# Patient Record
Sex: Female | Born: 1960
Health system: Southern US, Community
[De-identification: ages and names within clinical notes are randomized; demographics above are authoritative.]

## PROBLEM LIST (undated history)

## (undated) DIAGNOSIS — G709 Myoneural disorder, unspecified: Secondary | ICD-10-CM

## (undated) DIAGNOSIS — E119 Type 2 diabetes mellitus without complications: Secondary | ICD-10-CM

## (undated) DIAGNOSIS — G4730 Sleep apnea, unspecified: Secondary | ICD-10-CM

## (undated) DIAGNOSIS — E669 Obesity, unspecified: Secondary | ICD-10-CM

## (undated) DIAGNOSIS — Z794 Long term (current) use of insulin: Secondary | ICD-10-CM

## (undated) DIAGNOSIS — Z8719 Personal history of other diseases of the digestive system: Secondary | ICD-10-CM

## (undated) DIAGNOSIS — K219 Gastro-esophageal reflux disease without esophagitis: Secondary | ICD-10-CM

## (undated) DIAGNOSIS — Z87442 Personal history of urinary calculi: Secondary | ICD-10-CM

## (undated) DIAGNOSIS — I10 Essential (primary) hypertension: Secondary | ICD-10-CM

## (undated) DIAGNOSIS — F32A Depression, unspecified: Secondary | ICD-10-CM

## (undated) DIAGNOSIS — F419 Anxiety disorder, unspecified: Secondary | ICD-10-CM

## (undated) DIAGNOSIS — Z8489 Family history of other specified conditions: Secondary | ICD-10-CM

## (undated) DIAGNOSIS — D219 Benign neoplasm of connective and other soft tissue, unspecified: Secondary | ICD-10-CM

## (undated) DIAGNOSIS — M1990 Unspecified osteoarthritis, unspecified site: Secondary | ICD-10-CM

## (undated) DIAGNOSIS — E785 Hyperlipidemia, unspecified: Secondary | ICD-10-CM

## (undated) DIAGNOSIS — Z98811 Dental restoration status: Secondary | ICD-10-CM

## (undated) DIAGNOSIS — G43909 Migraine, unspecified, not intractable, without status migrainosus: Secondary | ICD-10-CM

## (undated) DIAGNOSIS — A35 Other tetanus: Secondary | ICD-10-CM

## (undated) DIAGNOSIS — Z9889 Other specified postprocedural states: Secondary | ICD-10-CM

## (undated) DIAGNOSIS — R112 Nausea with vomiting, unspecified: Secondary | ICD-10-CM

## (undated) DIAGNOSIS — R002 Palpitations: Secondary | ICD-10-CM

## (undated) DIAGNOSIS — IMO0001 Reserved for inherently not codable concepts without codable children: Secondary | ICD-10-CM

## (undated) DIAGNOSIS — IMO0002 Reserved for concepts with insufficient information to code with codable children: Secondary | ICD-10-CM

## (undated) DIAGNOSIS — J449 Chronic obstructive pulmonary disease, unspecified: Secondary | ICD-10-CM

## (undated) DIAGNOSIS — T7840XA Allergy, unspecified, initial encounter: Secondary | ICD-10-CM

## (undated) DIAGNOSIS — R0602 Shortness of breath: Secondary | ICD-10-CM

## (undated) DIAGNOSIS — G629 Polyneuropathy, unspecified: Secondary | ICD-10-CM

## (undated) DIAGNOSIS — F329 Major depressive disorder, single episode, unspecified: Secondary | ICD-10-CM

## (undated) DIAGNOSIS — Z8742 Personal history of other diseases of the female genital tract: Secondary | ICD-10-CM

## (undated) DIAGNOSIS — D649 Anemia, unspecified: Secondary | ICD-10-CM

## (undated) HISTORY — PX: LAPAROSCOPY: SHX197

## (undated) HISTORY — DX: Myoneural disorder, unspecified: G70.9

## (undated) HISTORY — DX: Obesity, unspecified: E66.9

## (undated) HISTORY — PX: SMALL INTESTINE SURGERY: SHX150

## (undated) HISTORY — DX: Gastro-esophageal reflux disease without esophagitis: K21.9

## (undated) HISTORY — DX: Reserved for concepts with insufficient information to code with codable children: IMO0002

## (undated) HISTORY — DX: Unspecified osteoarthritis, unspecified site: M19.90

## (undated) HISTORY — DX: Allergy, unspecified, initial encounter: T78.40XA

## (undated) HISTORY — DX: Essential (primary) hypertension: I10

## (undated) HISTORY — PX: ESOPHAGOGASTRODUODENOSCOPY: SHX1529

## (undated) HISTORY — DX: Hyperlipidemia, unspecified: E78.5

## (undated) HISTORY — PX: SPINE SURGERY: SHX786

## (undated) HISTORY — PX: BACK SURGERY: SHX140

## (undated) HISTORY — PX: JOINT REPLACEMENT: SHX530

## (undated) HISTORY — DX: Chronic obstructive pulmonary disease, unspecified: J44.9

---

## 1991-01-24 HISTORY — PX: TUBAL LIGATION: SHX77

## 1999-10-13 ENCOUNTER — Other Ambulatory Visit: Admission: RE | Admit: 1999-10-13 | Discharge: 1999-10-13 | Payer: Self-pay | Admitting: Obstetrics & Gynecology

## 1999-11-04 ENCOUNTER — Ambulatory Visit (HOSPITAL_COMMUNITY): Admission: RE | Admit: 1999-11-04 | Discharge: 1999-11-04 | Payer: Self-pay | Admitting: Obstetrics & Gynecology

## 1999-11-04 HISTORY — PX: PELVIC LAPAROSCOPY: SHX162

## 2003-12-04 ENCOUNTER — Ambulatory Visit (HOSPITAL_BASED_OUTPATIENT_CLINIC_OR_DEPARTMENT_OTHER): Admission: RE | Admit: 2003-12-04 | Discharge: 2003-12-04 | Payer: Self-pay | Admitting: Family Medicine

## 2004-09-16 ENCOUNTER — Encounter: Admission: RE | Admit: 2004-09-16 | Discharge: 2004-09-16 | Payer: Self-pay | Admitting: Obstetrics & Gynecology

## 2004-10-03 ENCOUNTER — Other Ambulatory Visit: Admission: RE | Admit: 2004-10-03 | Discharge: 2004-10-03 | Payer: Self-pay | Admitting: Obstetrics & Gynecology

## 2009-05-11 ENCOUNTER — Encounter: Payer: Self-pay | Admitting: Internal Medicine

## 2009-05-11 ENCOUNTER — Ambulatory Visit: Payer: Self-pay | Admitting: Gastroenterology

## 2009-05-11 DIAGNOSIS — R197 Diarrhea, unspecified: Secondary | ICD-10-CM | POA: Insufficient documentation

## 2009-05-11 DIAGNOSIS — K921 Melena: Secondary | ICD-10-CM | POA: Insufficient documentation

## 2009-05-11 DIAGNOSIS — D509 Iron deficiency anemia, unspecified: Secondary | ICD-10-CM | POA: Insufficient documentation

## 2009-05-11 DIAGNOSIS — K219 Gastro-esophageal reflux disease without esophagitis: Secondary | ICD-10-CM | POA: Insufficient documentation

## 2009-05-11 DIAGNOSIS — R1310 Dysphagia, unspecified: Secondary | ICD-10-CM | POA: Insufficient documentation

## 2009-05-19 ENCOUNTER — Encounter: Payer: Self-pay | Admitting: Internal Medicine

## 2009-05-23 HISTORY — PX: COLONOSCOPY: SHX174

## 2009-06-04 ENCOUNTER — Ambulatory Visit: Payer: Self-pay | Admitting: Internal Medicine

## 2009-06-04 ENCOUNTER — Ambulatory Visit (HOSPITAL_COMMUNITY): Admission: RE | Admit: 2009-06-04 | Discharge: 2009-06-04 | Payer: Self-pay | Admitting: Internal Medicine

## 2009-06-04 HISTORY — PX: ESOPHAGOGASTRODUODENOSCOPY: SHX1529

## 2009-06-08 ENCOUNTER — Encounter: Payer: Self-pay | Admitting: Internal Medicine

## 2009-06-16 ENCOUNTER — Encounter: Payer: Self-pay | Admitting: Internal Medicine

## 2010-02-13 ENCOUNTER — Encounter: Payer: Self-pay | Admitting: Obstetrics & Gynecology

## 2010-02-24 NOTE — Letter (Signed)
Summary: External Other  External Other   Imported By: Peggyann Shoals 05/19/2009 13:56:12  _____________________________________________________________________  External Attachment:    Type:   Image     Comment:   External Document

## 2010-02-24 NOTE — Letter (Signed)
Summary: tcs/egd order  tcs/egd order   Imported By: Ave Filter 05/11/2009 10:18:57  _____________________________________________________________________  External Attachment:    Type:   Image     Comment:   External Document

## 2010-02-24 NOTE — Assessment & Plan Note (Signed)
Summary: ABNORMAL LABS,GU   Visit Type:  consult Referring Provider:  Lilyan Punt Primary Care Provider:  Lilyan Punt  Chief Complaint:  abnormal labs and anemia.  History of Present Illness: [name] is a pleasant 50 y/o WF, patient of Dr. Gerda Diss, who presents for further evaluation of microcytic anemia. She denies h/o chronic anemia. In 3/11, she was found to have Hgb 9.3, MCV 71.5. She was started on iron supplements. She has h/o heavy menses over past several months. She has h/o endometriosis and uterine fibroids. She has noted blood clots per rectum a couple of months ago as well as more recently has had fresh blood with wiping. She has had increased stool frequency from 2-3 stools per day to now at least 5/day. Stools are all loose. Denies melena. She c/o lower abd cramps but not sure if due to gyn source. She c/o abd bloating and gas.  She has had heartburn for most of her life. Dx with hiatal hernia at age 66 via UGI series. Has been on PPI chronically, but since lost insurance she takes OTC omeprazole one to two times daily. She c/o dysphagia to liquids, like she has spasm when swallowing and then liquids comes back up. She denies unintentional weight loss. Lost 30 pounds last yr but gained 13 back. She admits to taking up to 15 ibuprofen daily for abd pain, but stopped in 3/11.  Labs, April 09, 2009. Hemoglobin 9.3, hematocrit 33.8, MCV 71.5, WBC 10,000, platelets 287,000, glucose 152, creatinine 0.76, LFTs normal, TSH 0.986.  Current Medications (verified): 1)  Iron Otc 27 Mg .... Three Times A Day 2)  Janumet 50-1000 Mg Tabs (Sitagliptin-Metformin Hcl) .... Two Times A Day 3)  Diovan 160 Mg Tabs (Valsartan) .... Once Daily 4)  Celexa 40 Mg Tabs (Citalopram Hydrobromide) .... Once Daily 5)  Prilosec 20 Mg Cpdr (Omeprazole) .... Once Daily 6)  Albuterol Sulfate (2.5 Mg/77ml) 0.083% Nebu (Albuterol Sulfate) .... Prn  Allergies (verified): 1)  ! Sulfa 2)  ! Codeine 3)  !  Prozac 4)  ! * Blood Pressure Meds 5)  ! Lisinopril 6)  ! Dyazide  Past History:  Past Medical History: Diabetes GERD Hypertension Kidney Stones, age 63s Obesity Uterine fibroids/endometriosis Asthma Carpel Tunnel, bilateral Right knee OA  Past Surgical History: Tubal Ligation Laproscopy due to endometriosis, twice Wisdom teeth extraction  Family History: No FH of CRC, liver, IBD, chronic GI illnesses.  Social History: Married. One child. Never smoked. No alcohol. No drugs. Unemployed for two yrs d/t health.  Review of Systems General:  Complains of fatigue and weakness; denies fever, chills, sweats, anorexia, and weight loss. Eyes:  Denies vision loss. ENT:  Complains of difficulty swallowing; denies nasal congestion, sore throat, and hoarseness. CV:  Denies chest pains, angina, palpitations, dyspnea on exertion, and peripheral edema;  . Resp:  Denies dyspnea at rest, dyspnea with exercise, cough, sputum, and wheezing; rarely uses inhaler for asthma. GI:  See HPI. GU:  Denies urinary burning and blood in urine. MS:  Complains of joint pain / LOM. Derm:  Denies rash and itching. Neuro:  Denies weakness, frequent headaches, memory loss, and confusion. Psych:  Denies depression and anxiety. Endo:  Denies unusual weight change. Heme:  Denies bruising and bleeding. Allergy:  Denies hives and rash.  Vital Signs:  Patient profile:   50 year old female Height:      63 inches Weight:      221 pounds BMI:     39.29 Temp:  98.2 degrees F oral Pulse rate:   80 / minute BP sitting:   140 / 90  (left arm) Cuff size:   regular  Vitals Entered By: Hendricks Limes LPN (May 11, 2009 9:33 AM)  Physical Exam  General:  Well developed, well nourished, no acute distress.obese.   Head:  Normocephalic and atraumatic. Eyes:  Conjunctivae pink, no scleral icterus.  Mouth:  Oropharyngeal mucosa moist, pink.  No lesions, erythema or exudate.    Neck:  Supple; no masses or  thyromegaly. Lungs:  Clear throughout to auscultation. Heart:  Regular rate and rhythm; no murmurs, rubs,  or bruits. Abdomen:  Bowel sounds normal.  Abdomen is soft, nontender, nondistended.  No rebound or guarding.  No hepatosplenomegaly, masses or hernias.  No abdominal bruits.  Rectal:  deferred until time of colonoscopy.   Extremities:  No clubbing, cyanosis, edema or deformities noted. Neurologic:  Alert and  oriented x4;  grossly normal neurologically. Skin:  Intact without significant lesions or rashes. Cervical Nodes:  No significant cervical adenopathy. Psych:  Alert and cooperative. Normal mood and affect.  Impression & Recommendations:  Problem # 1:  ANEMIA-IRON DEFICIENCY (ICD-280.9)  IDA in setting of heavy menses and hematachezia. Patient reports that she may have hysterectomy in near future. Given h/o passing blood clot per rectum and ongoing hematachezia, change in stool with increased frequent loose stools she needs TCS. We need to exclude IBD, NSAIDS induced colitis, malignancy. Colonoscopy to be performed in near future.  Risks, alternatives, and benefits including but not limited to the risk of reaction to medication, bleeding, infection, and perforation were addressed.  Patient voiced understanding and provided verbal consent. Will hold iron seven days before procedure.  Orders: Consultation Level IV (16109)  Problem # 2:  GERD (ICD-530.81)  Chronic GERD, difficulty to control on OTC prilosec. She c/o dysphagia to liquids. ?cervical esophageal stricture. Given significant ibuprofen use and IDA would also advise r/o PUD. EGD/ED to be performed in near future.  Risks, alternatives, benefits including but not limited to risk of reaction to medications, bleeding, infection, and perforation addressed.  Patient voiced understanding and verbal consent obtained.   Orders: Consultation Level IV (60454) I would like to thank Dr. Lilyan Punt for allowing [place] to take part in the  care of this nice patient.

## 2010-02-24 NOTE — Letter (Signed)
Summary: Patient Notice, Endo Biopsy Results  [facility]  [address]   [place], [place] [address]   Phone: [phone]  Fax: [phone]       Jun 08, 2009   Carrie Cook [address] [place], [place]  [address] 1961/01/19    Dear Ms. Lansberry,  I am pleased to inform you that the biopsies taken during your recent endoscopic examination did not show any evidence of cancer upon pathologic examination.  Additional information/recommendations:  Please call [phone] to schedule a return visit to review your condition.  Continue with the treatment plan as outlined on the day of your exam.  You should have a repeat endoscopic examination in 10 years.   Please call us if you are having persistent problems or have questions about your condition that have not been fully answered at this time.  Sincerely,    R. Roetta Sessions MD, FACP [facility] Ph: [phone]   Fax: [phone]   Appended Document: Patient Notice, Endo Biopsy Results Letter mailed to pt.  Appended Document: Patient Notice, Endo Biopsy Results Reminder placed in IDX.

## 2010-04-12 LAB — RETICULIN ANTIBODIES, IGA W TITER: Reticulin Ab, IgA: NEGATIVE

## 2010-04-12 LAB — TISSUE TRANSGLUTAMINASE, IGA: Tissue Transglutaminase Ab, IgA: 1.7 U/mL (ref <20)

## 2010-04-12 LAB — GLIADIN ANTIBODIES, SERUM
Gliadin IgA: 1.9 U/mL (ref <20)
Gliadin IgG: 15 U/mL (ref <20)

## 2010-04-12 LAB — GLUCOSE, CAPILLARY: Glucose-Capillary: 155 mg/dL — ABNORMAL HIGH (ref 70–99)

## 2010-04-12 LAB — IGA: IgA: 68 mg/dL (ref 68–378)

## 2010-06-10 NOTE — Op Note (Signed)
[facility]  Patient:    Carrie Cook, Carrie Cook                     MRN: [id number] Proc. Date: 11/04/99 Adm. Date:  [id number] Attending:  Minette Headland                           Operative Report  PREOPERATIVE DIAGNOSIS:  Chronic right pelvic pain.  Previous known history of pelvic endometriosis.  POSTOPERATIVE DIAGNOSIS:  Boggy enlargement of uterus consistent with adenomyosis, minimal evidence of recurrent pelvic peritoneal endometriosis, but significant occurrence of what appeared to be an endometrioma in the proximal segment of [place] divided right fallopian tube.  OPERATIVE PROCEDURE:  Laparoscopy, fulguration of two pelvic peritoneal lesions of endometriosis and fulguration of the proximal portion of each remaining fallopian tube with spontaneous drainage of chocolate-like material from the right consistent with a small endometrioma.  SURGEON:  Freddy Finner, M.D.  ANESTHESIA:  General endotracheal.  INTRAOPERATIVE COMPLICATIONS: None.  INTRAOPERATIVE BLOOD LOSS:  10 cc or less.  DETAILS OF PRESENT ILLNESS:  Include recurrence of symptoms that the patient relates to pelvic endometriosis.  She has previously been diagnosed with that condition and it was surgically treated in 1997.  She also had laparoscopic sterilization at that time.  She was admitted on the morning of surgery.  DESCRIPTION OF PROCEDURE:  She was brought to the operating room, there placed under adequate general endotracheal anesthesia and placed in the dorsal lithotomy position using the Allen stirrup system.  Betadine prep was carried out in the usual fashion and included prep of the abdomen, perineum, and vagina.  The bladder was evacuated with a sterile Gatling catheter.  Hulka tenaculum was cervix without difficulty.  Sterile drapes were applied.  Two small incisions were made, one at the umbilicus and one just above the symphysis.  A disposable 1011 trocar was  introduced at the umbilicus while elevating the anterior abdominal wall manually.  Direct inspection revealed adequate placement with no evidence of injury on entry.  Pneumoperitoneum was allowed to accumulate with carbon dioxide gas.  A second trocar was placed through the lower incision and through it a blunt probe used for manipulation of pelvic and abdominal structures.  Careful systemic examination of pelvic and abdominal contents was carried out.  Findings were essentially as noted in the postoperative diagnosis.  The endometriotic lesions, one was on the left pelvic side wall above the ureter, another was in the cul-de-sac just inferior to the right uterosacral ligament.  There were no pelvic adhesions, no adhesions in the upper abdomen.  The appendix was normal.  Liver and gallbladder appeared to be normal.  No apparent abnormalities were noted in the upper abdomen.  Photographs were made of significant findings and retained in the office record.  Using the bipolar forceps, all visible evidence of endometriosis including the peritoneal lesions and the apparent endometrioma of the proximal segment of tube on the right side were fulgurated with the bipolar forceps.  The uterosacral ligaments were also fulgurated at their junction with the cervix with the bipolar forceps.  Even though no visible endometriotic-like lesions were noted on the left fallopian tube remnant attached to the uterus, it was elected to fulgurate this too given the condition of the right tube.  All of this was accomplished without significant intraoperative or intraabdominal bleeding.  Gas was allowed to escape.  Hemostasis was complete, all instruments were removed.  The incision sites were injected with 0.5% plain Marcaine.  The incisions were closed with interrupted subcuticular sutures of 3-0 Dexon.  Steri-Strips were applied to the lower incision.  The patient was taken to recovery in good condition.  She  will be discharged in the immediate postoperative period for follow-up in the office in two weeks.  She was given routine outpatient surgical instructions.  She was given Vicodin to be taken as needed for postoperative pain.  She was given Ambien to be taken as needed for sleep. DD:  11/04/99 TD:  11/04/99 Job: [id number]

## 2010-06-10 NOTE — Procedures (Signed)
NAME:  Carrie Cook, Carrie Cook              ACCOUNT NO.:  [id number]   MEDICAL RECORD NO.:  [id number]          PATIENT TYPE:  OUT   LOCATION:  SLEEP CENTER                 FACILITY[facility]   PHYSICIAN:  Clinton D. Maple Hudson, M.D. DATE OF BIRTH:  1960/12/16   DATE OF STUDY:  12/04/2003                              NOCTURNAL POLYSOMNOGRAM   REFERRING PHYSICIAN:  Scott A. Luking, MD   INDICATION FOR STUDY AND HISTORY:  Hypersomnia with sleep apnea.   Epworth sleepiness score 18/24, BMI 39.8, weight 225 pounds.   SLEEP ARCHITECTURE:  Total sleep time 309 minutes with sleep efficiency of  84%.  Stage I was 20%; stage II, 41%; Stages III and IV 24%.  REM was 15% of  total sleep time.  Latency to sleep onset 38 minutes, latency to REM 268  minutes.  Awake after sleep onset 42 minutes.  Arousal index 28.  The  percentage of Stage I and the arousal index are increased.   RESPIRATORY DATA:  RDI 7.6 per hour indicating mild obstructive sleep  apnea/hypopnea syndrome.  This reflected 1 obstructive apnea and 38  hypopneas.  Events were not positional.  REM RDI 45 per hour.  There were  insufficient early events to permit of the split study protocol.   OXYGEN DATA:  Moderate snoring with oxygen desaturation to a nadir of 87%  during apneas.  Mean oxygen saturation through the study was 95 to 96% on  room air.   CARDIAC DATA:  Normal sinus rhythm.   MOVEMENT AND PARASOMNIA:  130 limb jerks were recorded of which 24 were  associated with arousal or awakening for a periodic limb movement with  arousal index of 4.7 per hour which is increased.   IMPRESSION AND RECOMMENDATIONS:  Mild obstructive sleep apnea/hypopnea  syndrome, RDI 7.6 per hour with desaturation of 87%.  Events were  disproportionately associated with REM, and a trial of REM-suppressing  therapy such as a tricyclic antidepressant at bedtime might be considered  along with recommendation for weight loss and sleep off flat of back.  CPAP  might be justified if these measures are inadequate or inappropriate.  Periodic limb movement with arousal at 4.7 per hour may also interfere with  sleep quality.   Clinton D. Maple Hudson, M.D.  Diplomate, Biomedical engineer of Sleep Medicine                                                           Clinton D. Maple Hudson, M.D.  Diplomate, American Board   CDY/MEDQ  D:  12/13/2003 10:41:59  T:  12/13/2003 11:49:06  Job:  [id number]   cc:   Lorin Picket A. Gerda Diss, MD  [address], [address]  [place]  [place] [address]  Fax: [phone]

## 2010-07-11 ENCOUNTER — Ambulatory Visit (HOSPITAL_COMMUNITY)
Admission: RE | Admit: 2010-07-11 | Discharge: 2010-07-11 | Disposition: A | Discharge status: Home / Self Care | Payer: Self-pay | Source: Ambulatory Visit | Attending: Family Medicine | Admitting: Family Medicine

## 2010-07-11 ENCOUNTER — Other Ambulatory Visit: Payer: Self-pay | Admitting: Family Medicine

## 2010-07-11 DIAGNOSIS — R059 Cough, unspecified: Secondary | ICD-10-CM | POA: Insufficient documentation

## 2010-07-11 DIAGNOSIS — R053 Chronic cough: Secondary | ICD-10-CM

## 2010-07-11 DIAGNOSIS — R05 Cough: Secondary | ICD-10-CM

## 2010-07-11 DIAGNOSIS — J42 Unspecified chronic bronchitis: Secondary | ICD-10-CM | POA: Insufficient documentation

## 2010-07-26 ENCOUNTER — Other Ambulatory Visit: Payer: Self-pay | Admitting: Family Medicine

## 2010-07-26 ENCOUNTER — Ambulatory Visit (HOSPITAL_COMMUNITY)
Admission: RE | Admit: 2010-07-26 | Discharge: 2010-07-26 | Disposition: A | Discharge status: Home / Self Care | Payer: Self-pay | Source: Ambulatory Visit | Attending: Family Medicine | Admitting: Family Medicine

## 2010-07-26 DIAGNOSIS — I10 Essential (primary) hypertension: Secondary | ICD-10-CM | POA: Insufficient documentation

## 2010-07-26 DIAGNOSIS — Z09 Encounter for follow-up examination after completed treatment for conditions other than malignant neoplasm: Secondary | ICD-10-CM

## 2010-07-26 DIAGNOSIS — R0789 Other chest pain: Secondary | ICD-10-CM | POA: Insufficient documentation

## 2010-07-26 DIAGNOSIS — J40 Bronchitis, not specified as acute or chronic: Secondary | ICD-10-CM | POA: Insufficient documentation

## 2010-07-26 DIAGNOSIS — E119 Type 2 diabetes mellitus without complications: Secondary | ICD-10-CM | POA: Insufficient documentation

## 2011-01-19 ENCOUNTER — Encounter: Payer: Self-pay | Admitting: Cardiology

## 2011-01-19 ENCOUNTER — Encounter: Payer: Self-pay | Admitting: Adult Health

## 2011-01-19 ENCOUNTER — Ambulatory Visit (INDEPENDENT_AMBULATORY_CARE_PROVIDER_SITE_OTHER): Payer: Self-pay | Admitting: Cardiology

## 2011-01-19 DIAGNOSIS — N809 Endometriosis, unspecified: Secondary | ICD-10-CM | POA: Insufficient documentation

## 2011-01-19 DIAGNOSIS — E785 Hyperlipidemia, unspecified: Secondary | ICD-10-CM

## 2011-01-19 DIAGNOSIS — J45909 Unspecified asthma, uncomplicated: Secondary | ICD-10-CM | POA: Insufficient documentation

## 2011-01-19 DIAGNOSIS — E669 Obesity, unspecified: Secondary | ICD-10-CM

## 2011-01-19 DIAGNOSIS — E1169 Type 2 diabetes mellitus with other specified complication: Secondary | ICD-10-CM | POA: Insufficient documentation

## 2011-01-19 DIAGNOSIS — N200 Calculus of kidney: Secondary | ICD-10-CM

## 2011-01-19 DIAGNOSIS — I10 Essential (primary) hypertension: Secondary | ICD-10-CM

## 2011-01-19 DIAGNOSIS — D509 Iron deficiency anemia, unspecified: Secondary | ICD-10-CM

## 2011-01-19 DIAGNOSIS — E119 Type 2 diabetes mellitus without complications: Secondary | ICD-10-CM

## 2011-01-19 DIAGNOSIS — Z0189 Encounter for other specified special examinations: Secondary | ICD-10-CM

## 2011-01-19 DIAGNOSIS — R079 Chest pain, unspecified: Secondary | ICD-10-CM | POA: Insufficient documentation

## 2011-01-19 MED ORDER — VALSARTAN-HYDROCHLOROTHIAZIDE 320-25 MG PO TABS: 1.0000 | ORAL_TABLET | Freq: Every day | ORAL | Status: DC

## 2011-01-19 MED ORDER — AMLODIPINE BESYLATE 5 MG PO TABS: 5.0000 mg | ORAL_TABLET | Freq: Every day | ORAL | Status: DC

## 2011-01-19 NOTE — Progress Notes (Signed)
Patient ID: Carrie Cook, female   DOB: 03/15/1960, 50 y.o.   MRN: [id number] HPI: Patient is seen at the kind request of Dr. Gerda Diss for evaluation of chest discomfort.  This nice woman has enjoyed generally good health.  She has a history of hyperlipidemia for which pharmacologic therapy has been prescribed and taken without adverse effects.  Other major risk factors for vascular disease include diabetes, which was diagnosed approximately 6 years ago, has been suboptimally controlled and will likely require initiation of treatment with insulin in the near future.  She describes episodes of fairly mild sharp chest discomfort with radiation to the neck and back and associated numbness in these regions as well as intermittent numbness of the right leg.  She has chronic class II exertional dyspnea, but no such symptoms associated with chest discomfort.  She has had no diaphoresis, nausea nor emesis.  She is unaware of anything that illicits or relieves her symptoms, but feels that they may be related to anxiety.  No current outpatient prescriptions on file prior to visit.   Current Outpatient Prescriptions  Medication Sig Dispense Refill  . albuterol (PROVENTIL HFA;VENTOLIN HFA) 108 (90 BASE) MCG/ACT inhaler Inhale 2 puffs into the lungs every 6 (six) hours as needed.        . ALPRAZolam (XANAX) 0.5 MG tablet Take 0.5 mg by mouth at bedtime as needed.        Marland Kitchen aspirin 81 MG tablet Take 81 mg by mouth daily.        Marland Kitchen atorvastatin (LIPITOR) 10 MG tablet Take 10 mg by mouth daily.        . budesonide-formoterol (SYMBICORT) 160-4.5 MCG/ACT inhaler Inhale 2 puffs into the lungs 2 (two) times daily as needed.        . DULoxetine (CYMBALTA) 60 MG capsule Take 60 mg by mouth daily.        . fish oil-omega-3 fatty acids 1000 MG capsule Take 2 g by mouth daily.        Marland Kitchen glyBURIDE micronized (GLYNASE) 3 MG tablet Take 6 mg by mouth 2 (two) times daily with a meal.        . Omega-3 Fatty Acids (FISH OIL) 1000 MG  CAPS Take 1,000 mg by mouth 2 (two) times daily.        . RABEprazole (ACIPHEX) 20 MG tablet Take 20 mg by mouth daily.        . sitaGLIPtan-metformin (JANUMET) 50-1000 MG per tablet Take 1 tablet by mouth 2 (two) times daily with a meal.        . amLODipine (NORVASC) 5 MG tablet Take 1 tablet (5 mg total) by mouth daily.  90 tablet  3  . valsartan-hydrochlorothiazide (DIOVAN-HCT) 320-25 MG per tablet Take 1 tablet by mouth daily.  90 tablet  3   Allergies  Allergen Reactions  . Codeine   . Dyazide (Hydrochlorothiazide W/Triamterene)   . Erythromycin Nausea Only  . Lisinopril     REACTION: cough  . Prozac (Fluoxetine Hcl)     REACTION: hives  . Sulfonamide Derivatives       Past Medical History  Diagnosis Date  . Chest pain   . Hyperlipidemia     Lipid profile in 12/2010:184, 294, 36, 89  . Diabetes mellitus 2005    Onset in 2005; requires no insulin; 12/2010-A1c of 10.2  . Obesity   . Gastroesophageal reflux disease   . Nephrolithiasis   . Asthma     Past Surgical History  Procedure Date  . Tubal ligation 1993  . Pelvic laparoscopy     Endometriosis  . Colonoscopy 05/2009    History reviewed. No pertinent family history.   History   Social History  . Marital Status: Single    Spouse Name: N/A    Number of Children: N/A  . Years of Education: N/A   Occupational History  . Not on file.   Social History Main Topics  . Smoking status: Never Smoker   . Smokeless tobacco: Not on file  . Alcohol Use: Not on file  . Drug Use: Not on file  . Sexually Active: Not on file   Other Topics Concern  . Not on file   Social History Narrative  . No narrative on file   ROS: Patient reports occasional headaches and dizziness; frequent episodes of wheezing with exertional dyspnea; urinary frequency plus nocturia; excessive thirst; swelling of upper and lower extremities with muscle cramps and arthralgias; rhinitis; asthma.     All other systems reviewed and are  negative.  PHYSICAL EXAM: BP 170/105  Pulse 118  Ht 5\' 3"  (1.6 m)  Wt 101.152 kg (223 lb)  BMI 39.50 kg/m2  General-Well-developed; no acute distress Body Habitus-obese HEENT-[place]/AT; PERRL; EOM intact; conjunctiva and lids nl Neck-No JVD; no carotid bruits Endocrine- mild diffuse thyroid enlargement Lungs-Clear lung fields; resonant percussion; normal I-to-E ratio; decreased breath sounds at the bases Cardiovascular- normal PMI; normal S1 and S2 Abdomen-BS normal; soft and non-tender without masses or organomegaly Musculoskeletal-No deformities, cyanosis or clubbing Neurologic-Nl cranial nerves; symmetric strength and tone Skin- Warm, no significant lesions Extremities-Nl distal pulses; no edema  EKG:  Sinus tachycardia at a rate of 111 bpm; low voltage in the chest leads; borderline left atrial abnormality; moderately delayed R-wave progression.  No previous tracing for comparison.   ASSESSMENT AND PLAN:  [place] Bing, MD 01/19/2011 12:36 PM

## 2011-01-19 NOTE — Assessment & Plan Note (Signed)
Patient reports asthmatic symptoms, but examination is benign at this visit.

## 2011-01-19 NOTE — Assessment & Plan Note (Addendum)
Chest discomfort is very atypical.  Patient's risk of coronary disease with premenopausal status is quite low, less than 1%/year by the Framingham risk score.  She is concerned about the expense of testing.  Under the circumstances, I think we can dispense with a stress imaging study.  Symptoms are likely noncardiac in nature.

## 2011-01-19 NOTE — Assessment & Plan Note (Signed)
No CBC is available for review.

## 2011-01-19 NOTE — Assessment & Plan Note (Addendum)
Lipid abnormalities are most notable for elevated triglycerides and a fairly low HDL.  Both should improve once optimal diabetic control has being achieved.

## 2011-01-19 NOTE — Assessment & Plan Note (Signed)
Blood pressure control is suboptimal.  Amlodipine and thiazide diuretic will be added to the patient's regimen with a blood pressure check in one month and monitoring by the patient in the interim.

## 2011-01-19 NOTE — Assessment & Plan Note (Signed)
Control of diabetes is suboptimal.  Treatment of this problem is under the direction of Dr. Gerda Diss.

## 2011-01-19 NOTE — Patient Instructions (Addendum)
Your physician recommends that you schedule a follow-up appointment in: 2 months   Your physician has recommended you make the following change in your medication:  1 - STOP Valsartan 2 - START Valsartan/HCT 320/25 mg 3 - START Amlodipine 5 mg daily   Your physician recommends that you return for lab work in: 1 month   Your physician has requested that you regularly monitor and record your blood pressure readings at home. Please use the same machine at the same time of day to check your readings and record them to bring to your follow-up visit.

## 2011-02-24 ENCOUNTER — Other Ambulatory Visit: Payer: Self-pay | Admitting: Cardiology

## 2011-02-25 LAB — BASIC METABOLIC PANEL
BUN: 11 mg/dL (ref 6–23)
CO2: 25 mEq/L (ref 19–32)
Calcium: 9.2 mg/dL (ref 8.4–10.5)
Chloride: 98 mEq/L (ref 96–112)
Creat: 0.76 mg/dL (ref 0.50–1.10)
Glucose, Bld: 249 mg/dL — ABNORMAL HIGH (ref 70–99)
Potassium: 4 mEq/L (ref 3.5–5.3)
Sodium: 135 mEq/L (ref 135–145)

## 2011-02-27 ENCOUNTER — Encounter: Payer: Self-pay | Admitting: *Deleted

## 2011-03-22 ENCOUNTER — Encounter: Payer: Self-pay | Admitting: Cardiology

## 2011-03-22 ENCOUNTER — Ambulatory Visit: Payer: Self-pay | Admitting: Cardiology

## 2011-03-22 ENCOUNTER — Ambulatory Visit (INDEPENDENT_AMBULATORY_CARE_PROVIDER_SITE_OTHER): Payer: Self-pay | Admitting: Cardiology

## 2011-03-22 DIAGNOSIS — I10 Essential (primary) hypertension: Secondary | ICD-10-CM

## 2011-03-22 DIAGNOSIS — R079 Chest pain, unspecified: Secondary | ICD-10-CM

## 2011-03-22 DIAGNOSIS — E119 Type 2 diabetes mellitus without complications: Secondary | ICD-10-CM

## 2011-03-22 DIAGNOSIS — J45909 Unspecified asthma, uncomplicated: Secondary | ICD-10-CM

## 2011-03-22 MED ORDER — VALSARTAN-HYDROCHLOROTHIAZIDE 320-25 MG PO TABS: 1.0000 | ORAL_TABLET | Freq: Every day | ORAL | Status: DC

## 2011-03-22 NOTE — Patient Instructions (Signed)
**Note De-identified  Obfuscation** Your physician recommends that you continue on your current medications as directed. Please refer to the Current Medication list given to you today.  Your physician recommends that you schedule a follow-up appointment in: as needed  

## 2011-03-22 NOTE — Assessment & Plan Note (Signed)
Symptoms are improved after addition of Symbicort to her medical regime by Dr. Gerda Diss.

## 2011-03-22 NOTE — Assessment & Plan Note (Signed)
Improved control of diabetes will likely result in an increase in HDL and decrease in triglycerides.

## 2011-03-22 NOTE — Progress Notes (Signed)
Patient ID: Carrie Cook, female   DOB: 1960-09-26, 51 y.o.   MRN: [id number] HPI: Scheduled return visit for this very nice woman with hypertension, diabetes, hyperlipidemia and chest discomfort.  Since her last visit, she has made good progress from a medical standpoint.  Chest discomfort still occasionally occurs, but is much less frequent milder than it was in the past.  Blood pressure control has been improved.  Insulin has been prescribed, but she has not yet started taking it.  Unfortunately, she purchased her own supply of valsartan/HCT that cost more than $150 for a one-month prescription.  She had a similar experience with Symbicort.  Fortunately, she can obtain both of these from the health department.  Exercise is limited by chronic problems with her right knee.  She has never been seen by an orthopaedic surgeon for evaluation.  Prior to Admission medications   Medication Sig Start Date End Date Taking? Authorizing Provider  albuterol (PROVENTIL HFA;VENTOLIN HFA) 108 (90 BASE) MCG/ACT inhaler Inhale 2 puffs into the lungs every 6 (six) hours as needed.     Yes Historical Provider, MD  ALPRAZolam Prudy Feeler) 0.5 MG tablet Take 0.5 mg by mouth at bedtime as needed.     Yes Historical Provider, MD  amLODipine (NORVASC) 5 MG tablet Take 1 tablet (5 mg total) by mouth daily. 01/19/11 01/19/12 Yes Gerrit Friends. Mykell Genao, MD  aspirin 81 MG tablet Take 81 mg by mouth daily.     Yes Historical Provider, MD  atorvastatin (LIPITOR) 10 MG tablet Take 10 mg by mouth daily.     Yes Historical Provider, MD  budesonide-formoterol (SYMBICORT) 160-4.5 MCG/ACT inhaler Inhale 2 puffs into the lungs 2 (two) times daily as needed.     Yes Historical Provider, MD  DULoxetine (CYMBALTA) 60 MG capsule Take 60 mg by mouth daily.     Yes Historical Provider, MD  fish oil-omega-3 fatty acids 1000 MG capsule Take 2 g by mouth daily.     Yes Historical Provider, MD  glyBURIDE micronized (GLYNASE) 3 MG tablet Take 6 mg by mouth  2 (two) times daily with a meal.     Yes Historical Provider, MD  Omega-3 Fatty Acids (FISH OIL) 1000 MG CAPS Take 1,000 mg by mouth 2 (two) times daily.     Yes Historical Provider, MD  RABEprazole (ACIPHEX) 20 MG tablet Take 20 mg by mouth daily.     Yes Historical Provider, MD  sitaGLIPtan-metformin (JANUMET) 50-1000 MG per tablet Take 1 tablet by mouth 2 (two) times daily with a meal.     Yes Historical Provider, MD  valsartan-hydrochlorothiazide (DIOVAN-HCT) 320-25 MG per tablet Take 1 tablet by mouth daily. 01/19/11 01/19/12 Yes Gerrit Friends. Dietrich Pates, MD   Allergies  Allergen Reactions  . Codeine   . Dyazide (Hydrochlorothiazide W/Triamterene)   . Erythromycin Nausea Only  . Lisinopril     REACTION: cough  . Prozac (Fluoxetine Hcl)     REACTION: hives  . Sulfonamide Derivatives    Past medical history, social history, and family history reviewed and updated.  ROS: Denies orthopnea, PND, palpitations, lightheadedness or syncope.  She notes intermittent mild pedal edema.  PHYSICAL EXAM: BP 131/88  Pulse 108  Resp 16  Ht 5\' 3"  (1.6 m)  Wt 100.699 kg (222 lb)  BMI 39.33 kg/m2  General-Well developed; no acute distress Body habitus-Obese Neck-No JVD; no carotid bruits Lungs-clear lung fields; resonant to percussion Cardiovascular-normal PMI; normal S1 and S2 Abdomen-normal bowel sounds; soft and non-tender without masses or  organomegaly Musculoskeletal-No deformities, no cyanosis or clubbing Neurologic-Normal cranial nerves; symmetric strength and tone Skin-Warm, no significant lesions Extremities-distal pulses intact; no edema  ASSESSMENT AND PLAN:  Winsted Bing, MD 03/22/2011 2:53 PM

## 2011-03-22 NOTE — Assessment & Plan Note (Signed)
Chest discomfort has improved following better control of blood pressure, treatment with amlodipine and treatment with a PPI.  With minimal residual symptoms, no further testing or treatment is required.

## 2011-03-22 NOTE — Assessment & Plan Note (Signed)
Patient returns with a list of home blood pressures, all of which are good.  Systolic pressure exceeded 140 (144) on only one occasion while diastolics were all less than 90.  She will continue her current regimen and return to see me should additional problems arise.

## 2011-04-18 ENCOUNTER — Other Ambulatory Visit: Payer: Self-pay | Admitting: Obstetrics & Gynecology

## 2011-04-18 ENCOUNTER — Other Ambulatory Visit (HOSPITAL_COMMUNITY)
Admission: RE | Admit: 2011-04-18 | Discharge: 2011-04-18 | Disposition: A | Discharge status: Home / Self Care | Payer: Self-pay | Source: Ambulatory Visit | Attending: Obstetrics & Gynecology | Admitting: Obstetrics & Gynecology

## 2011-04-18 DIAGNOSIS — Z01419 Encounter for gynecological examination (general) (routine) without abnormal findings: Secondary | ICD-10-CM | POA: Insufficient documentation

## 2011-08-23 DIAGNOSIS — E785 Hyperlipidemia, unspecified: Secondary | ICD-10-CM | POA: Diagnosis not present

## 2011-08-23 DIAGNOSIS — E119 Type 2 diabetes mellitus without complications: Secondary | ICD-10-CM | POA: Diagnosis not present

## 2011-09-14 DIAGNOSIS — Z79899 Other long term (current) drug therapy: Secondary | ICD-10-CM | POA: Diagnosis not present

## 2011-09-14 DIAGNOSIS — I10 Essential (primary) hypertension: Secondary | ICD-10-CM | POA: Diagnosis not present

## 2011-09-14 DIAGNOSIS — E119 Type 2 diabetes mellitus without complications: Secondary | ICD-10-CM | POA: Diagnosis not present

## 2011-10-02 DIAGNOSIS — F332 Major depressive disorder, recurrent severe without psychotic features: Secondary | ICD-10-CM | POA: Diagnosis not present

## 2011-10-12 DIAGNOSIS — E119 Type 2 diabetes mellitus without complications: Secondary | ICD-10-CM | POA: Diagnosis not present

## 2011-10-12 DIAGNOSIS — M2550 Pain in unspecified joint: Secondary | ICD-10-CM | POA: Diagnosis not present

## 2011-10-12 DIAGNOSIS — M549 Dorsalgia, unspecified: Secondary | ICD-10-CM | POA: Diagnosis not present

## 2011-10-12 DIAGNOSIS — Z23 Encounter for immunization: Secondary | ICD-10-CM | POA: Diagnosis not present

## 2011-10-30 DIAGNOSIS — F332 Major depressive disorder, recurrent severe without psychotic features: Secondary | ICD-10-CM | POA: Diagnosis not present

## 2011-11-30 DIAGNOSIS — E119 Type 2 diabetes mellitus without complications: Secondary | ICD-10-CM | POA: Diagnosis not present

## 2011-12-11 DIAGNOSIS — G5600 Carpal tunnel syndrome, unspecified upper limb: Secondary | ICD-10-CM | POA: Diagnosis not present

## 2011-12-13 DIAGNOSIS — J310 Chronic rhinitis: Secondary | ICD-10-CM | POA: Diagnosis not present

## 2011-12-13 DIAGNOSIS — J988 Other specified respiratory disorders: Secondary | ICD-10-CM | POA: Diagnosis not present

## 2011-12-24 HISTORY — PX: OTHER SURGICAL HISTORY: SHX169

## 2011-12-25 DIAGNOSIS — G5600 Carpal tunnel syndrome, unspecified upper limb: Secondary | ICD-10-CM | POA: Diagnosis not present

## 2011-12-27 ENCOUNTER — Other Ambulatory Visit: Payer: Self-pay | Admitting: Orthopedic Surgery

## 2012-01-01 ENCOUNTER — Encounter (HOSPITAL_BASED_OUTPATIENT_CLINIC_OR_DEPARTMENT_OTHER): Payer: Self-pay | Admitting: *Deleted

## 2012-01-01 NOTE — Progress Notes (Addendum)
Bring all medications. Pt has sleep apnea and does not use CPAP - has never used CPAP. Coming tomorrow for [employer] and Ekg. Requested Sleep study and last office notes from Dr. Fanny Dance office.

## 2012-01-02 ENCOUNTER — Encounter (HOSPITAL_BASED_OUTPATIENT_CLINIC_OR_DEPARTMENT_OTHER)
Admission: RE | Admit: 2012-01-02 | Discharge: 2012-01-02 | Disposition: A | Discharge status: Home / Self Care | Payer: Medicare Other | Source: Ambulatory Visit | Attending: Orthopedic Surgery | Admitting: Orthopedic Surgery

## 2012-01-02 DIAGNOSIS — Z794 Long term (current) use of insulin: Secondary | ICD-10-CM | POA: Diagnosis not present

## 2012-01-02 DIAGNOSIS — E669 Obesity, unspecified: Secondary | ICD-10-CM | POA: Diagnosis not present

## 2012-01-02 DIAGNOSIS — Z836 Family history of other diseases of the respiratory system: Secondary | ICD-10-CM | POA: Diagnosis not present

## 2012-01-02 DIAGNOSIS — Z8249 Family history of ischemic heart disease and other diseases of the circulatory system: Secondary | ICD-10-CM | POA: Diagnosis not present

## 2012-01-02 DIAGNOSIS — Z882 Allergy status to sulfonamides status: Secondary | ICD-10-CM | POA: Diagnosis not present

## 2012-01-02 DIAGNOSIS — E785 Hyperlipidemia, unspecified: Secondary | ICD-10-CM | POA: Diagnosis not present

## 2012-01-02 DIAGNOSIS — K219 Gastro-esophageal reflux disease without esophagitis: Secondary | ICD-10-CM | POA: Diagnosis not present

## 2012-01-02 DIAGNOSIS — F329 Major depressive disorder, single episode, unspecified: Secondary | ICD-10-CM | POA: Diagnosis not present

## 2012-01-02 DIAGNOSIS — E119 Type 2 diabetes mellitus without complications: Secondary | ICD-10-CM | POA: Diagnosis not present

## 2012-01-02 DIAGNOSIS — G4730 Sleep apnea, unspecified: Secondary | ICD-10-CM | POA: Diagnosis not present

## 2012-01-02 DIAGNOSIS — Z881 Allergy status to other antibiotic agents status: Secondary | ICD-10-CM | POA: Diagnosis not present

## 2012-01-02 DIAGNOSIS — Z885 Allergy status to narcotic agent status: Secondary | ICD-10-CM | POA: Diagnosis not present

## 2012-01-02 DIAGNOSIS — I10 Essential (primary) hypertension: Secondary | ICD-10-CM | POA: Diagnosis not present

## 2012-01-02 DIAGNOSIS — Z01810 Encounter for preprocedural cardiovascular examination: Secondary | ICD-10-CM | POA: Diagnosis not present

## 2012-01-02 DIAGNOSIS — G5600 Carpal tunnel syndrome, unspecified upper limb: Secondary | ICD-10-CM | POA: Diagnosis not present

## 2012-01-02 DIAGNOSIS — J45909 Unspecified asthma, uncomplicated: Secondary | ICD-10-CM | POA: Diagnosis not present

## 2012-01-02 DIAGNOSIS — Z01812 Encounter for preprocedural laboratory examination: Secondary | ICD-10-CM | POA: Diagnosis not present

## 2012-01-02 DIAGNOSIS — Z8049 Family history of malignant neoplasm of other genital organs: Secondary | ICD-10-CM | POA: Diagnosis not present

## 2012-01-02 LAB — BASIC METABOLIC PANEL
BUN: 12 mg/dL (ref 6–23)
CO2: 25 mEq/L (ref 19–32)
Calcium: 9.3 mg/dL (ref 8.4–10.5)
Chloride: 98 mEq/L (ref 96–112)
Creatinine, Ser: 0.72 mg/dL (ref 0.50–1.10)
GFR calc Af Amer: >90 mL/min (ref >90)
GFR calc non Af Amer: >90 mL/min (ref >90)
Glucose, Bld: 226 mg/dL — ABNORMAL HIGH (ref 70–99)
Potassium: 3.7 mEq/L (ref 3.5–5.1)
Sodium: 136 mEq/L (ref 135–145)

## 2012-01-03 ENCOUNTER — Encounter (HOSPITAL_BASED_OUTPATIENT_CLINIC_OR_DEPARTMENT_OTHER)
Admission: RE | Disposition: A | Discharge status: Home / Self Care | Payer: Self-pay | Source: Ambulatory Visit | Attending: Orthopedic Surgery

## 2012-01-03 ENCOUNTER — Ambulatory Visit (HOSPITAL_BASED_OUTPATIENT_CLINIC_OR_DEPARTMENT_OTHER): Payer: Medicare Other | Admitting: Anesthesiology

## 2012-01-03 ENCOUNTER — Encounter (HOSPITAL_BASED_OUTPATIENT_CLINIC_OR_DEPARTMENT_OTHER): Payer: Self-pay | Admitting: Orthopedic Surgery

## 2012-01-03 ENCOUNTER — Encounter (HOSPITAL_BASED_OUTPATIENT_CLINIC_OR_DEPARTMENT_OTHER): Payer: Self-pay | Admitting: Anesthesiology

## 2012-01-03 ENCOUNTER — Encounter (HOSPITAL_BASED_OUTPATIENT_CLINIC_OR_DEPARTMENT_OTHER): Payer: Self-pay | Admitting: *Deleted

## 2012-01-03 ENCOUNTER — Ambulatory Visit (HOSPITAL_BASED_OUTPATIENT_CLINIC_OR_DEPARTMENT_OTHER)
Admission: RE | Admit: 2012-01-03 | Discharge: 2012-01-03 | Disposition: A | Discharge status: Home / Self Care | Payer: Medicare Other | Source: Ambulatory Visit | Attending: Orthopedic Surgery | Admitting: Orthopedic Surgery

## 2012-01-03 DIAGNOSIS — F3289 Other specified depressive episodes: Secondary | ICD-10-CM | POA: Insufficient documentation

## 2012-01-03 DIAGNOSIS — G5600 Carpal tunnel syndrome, unspecified upper limb: Secondary | ICD-10-CM | POA: Diagnosis not present

## 2012-01-03 DIAGNOSIS — E119 Type 2 diabetes mellitus without complications: Secondary | ICD-10-CM | POA: Insufficient documentation

## 2012-01-03 DIAGNOSIS — G4730 Sleep apnea, unspecified: Secondary | ICD-10-CM | POA: Insufficient documentation

## 2012-01-03 DIAGNOSIS — J45909 Unspecified asthma, uncomplicated: Secondary | ICD-10-CM | POA: Insufficient documentation

## 2012-01-03 DIAGNOSIS — E785 Hyperlipidemia, unspecified: Secondary | ICD-10-CM | POA: Insufficient documentation

## 2012-01-03 DIAGNOSIS — Z8249 Family history of ischemic heart disease and other diseases of the circulatory system: Secondary | ICD-10-CM | POA: Insufficient documentation

## 2012-01-03 DIAGNOSIS — Z8049 Family history of malignant neoplasm of other genital organs: Secondary | ICD-10-CM | POA: Insufficient documentation

## 2012-01-03 DIAGNOSIS — Z882 Allergy status to sulfonamides status: Secondary | ICD-10-CM | POA: Insufficient documentation

## 2012-01-03 DIAGNOSIS — F329 Major depressive disorder, single episode, unspecified: Secondary | ICD-10-CM | POA: Insufficient documentation

## 2012-01-03 DIAGNOSIS — K219 Gastro-esophageal reflux disease without esophagitis: Secondary | ICD-10-CM | POA: Insufficient documentation

## 2012-01-03 DIAGNOSIS — Z794 Long term (current) use of insulin: Secondary | ICD-10-CM | POA: Insufficient documentation

## 2012-01-03 DIAGNOSIS — Z881 Allergy status to other antibiotic agents status: Secondary | ICD-10-CM | POA: Insufficient documentation

## 2012-01-03 DIAGNOSIS — Z01810 Encounter for preprocedural cardiovascular examination: Secondary | ICD-10-CM | POA: Insufficient documentation

## 2012-01-03 DIAGNOSIS — Z01812 Encounter for preprocedural laboratory examination: Secondary | ICD-10-CM | POA: Insufficient documentation

## 2012-01-03 DIAGNOSIS — Z885 Allergy status to narcotic agent status: Secondary | ICD-10-CM | POA: Insufficient documentation

## 2012-01-03 DIAGNOSIS — E669 Obesity, unspecified: Secondary | ICD-10-CM | POA: Insufficient documentation

## 2012-01-03 DIAGNOSIS — I10 Essential (primary) hypertension: Secondary | ICD-10-CM | POA: Insufficient documentation

## 2012-01-03 DIAGNOSIS — Z836 Family history of other diseases of the respiratory system: Secondary | ICD-10-CM | POA: Insufficient documentation

## 2012-01-03 HISTORY — PX: CARPAL TUNNEL RELEASE: SHX101

## 2012-01-03 HISTORY — DX: Other specified postprocedural states: Z98.890

## 2012-01-03 HISTORY — DX: Shortness of breath: R06.02

## 2012-01-03 HISTORY — DX: Personal history of other diseases of the digestive system: Z87.19

## 2012-01-03 HISTORY — DX: Major depressive disorder, single episode, unspecified: F32.9

## 2012-01-03 HISTORY — DX: Nausea with vomiting, unspecified: R11.2

## 2012-01-03 HISTORY — DX: Anxiety disorder, unspecified: F41.9

## 2012-01-03 HISTORY — DX: Depression, unspecified: F32.A

## 2012-01-03 HISTORY — DX: Sleep apnea, unspecified: G47.30

## 2012-01-03 LAB — GLUCOSE, CAPILLARY
Glucose-Capillary: 144 mg/dL — ABNORMAL HIGH (ref 70–99)
Glucose-Capillary: 126 mg/dL — ABNORMAL HIGH (ref 70–99)

## 2012-01-03 SURGERY — CARPAL TUNNEL RELEASE
Anesthesia: Monitor Anesthesia Care | Site: Wrist | Laterality: Right | Wound class: Clean

## 2012-01-03 MED ORDER — PROPOFOL 10 MG/ML IV BOLUS
INTRAVENOUS | Status: DC | PRN
  Administered 2012-01-03: 20 mg via INTRAVENOUS

## 2012-01-03 MED ORDER — OXYCODONE HCL 5 MG PO TABS: 5.0000 mg | ORAL_TABLET | Freq: Once | ORAL | Status: DC | PRN

## 2012-01-03 MED ORDER — BUPIVACAINE HCL (PF) 0.25 % IJ SOLN
INTRAMUSCULAR | Status: DC | PRN
  Administered 2012-01-03: 10 mL

## 2012-01-03 MED ORDER — FENTANYL CITRATE 0.05 MG/ML IJ SOLN: 50.0000 ug | INTRAMUSCULAR | Status: DC | PRN

## 2012-01-03 MED ORDER — MIDAZOLAM HCL 2 MG/2ML IJ SOLN: 1.0000 mg | INTRAMUSCULAR | Status: DC | PRN

## 2012-01-03 MED ORDER — PROPOFOL 10 MG/ML IV EMUL
INTRAVENOUS | Status: DC | PRN
  Administered 2012-01-03: 100 ug/kg/min via INTRAVENOUS

## 2012-01-03 MED ORDER — CHLORHEXIDINE GLUCONATE 4 % EX LIQD: 60.0000 mL | Freq: Once | CUTANEOUS | Status: DC

## 2012-01-03 MED ORDER — ONDANSETRON HCL 4 MG/2ML IJ SOLN
INTRAMUSCULAR | Status: DC | PRN
  Administered 2012-01-03: 4 mg via INTRAVENOUS

## 2012-01-03 MED ORDER — PROMETHAZINE HCL 25 MG/ML IJ SOLN: 6.2500 mg | INTRAMUSCULAR | Status: DC | PRN

## 2012-01-03 MED ORDER — SCOPOLAMINE 1 MG/3DAYS TD PT72
1.0000 | MEDICATED_PATCH | TRANSDERMAL | Status: DC
  Administered 2012-01-03: 1.5 mg via TRANSDERMAL

## 2012-01-03 MED ORDER — MIDAZOLAM HCL 5 MG/5ML IJ SOLN
INTRAMUSCULAR | Status: DC | PRN
  Administered 2012-01-03: 2 mg via INTRAVENOUS

## 2012-01-03 MED ORDER — FENTANYL CITRATE 0.05 MG/ML IJ SOLN
INTRAMUSCULAR | Status: DC | PRN
  Administered 2012-01-03: 100 ug via INTRAVENOUS

## 2012-01-03 MED ORDER — PENTAZOCINE-NALOXONE 50-0.5 MG PO TABS: 1.0000 | ORAL_TABLET | ORAL | Status: DC | PRN

## 2012-01-03 MED ORDER — MEPERIDINE HCL 25 MG/ML IJ SOLN: 6.2500 mg | INTRAMUSCULAR | Status: DC | PRN

## 2012-01-03 MED ORDER — MIDAZOLAM HCL 2 MG/2ML IJ SOLN: 0.5000 mg | Freq: Once | INTRAMUSCULAR | Status: DC | PRN

## 2012-01-03 MED ORDER — SCOPOLAMINE 1 MG/3DAYS TD PT72: 1.0000 | MEDICATED_PATCH | TRANSDERMAL | Status: DC

## 2012-01-03 MED ORDER — LIDOCAINE HCL (PF) 0.5 % IJ SOLN
INTRAMUSCULAR | Status: DC | PRN
  Administered 2012-01-03: 30 mL via INTRAVENOUS

## 2012-01-03 MED ORDER — OXYCODONE HCL 5 MG/5ML PO SOLN: 5.0000 mg | Freq: Once | ORAL | Status: DC | PRN

## 2012-01-03 MED ORDER — CEFAZOLIN SODIUM-DEXTROSE 2-3 GM-% IV SOLR
2.0000 g | INTRAVENOUS | Status: AC
  Administered 2012-01-03: 2 g via INTRAVENOUS

## 2012-01-03 MED ORDER — LACTATED RINGERS IV SOLN
INTRAVENOUS | Status: DC
  Administered 2012-01-03 (×2): via INTRAVENOUS

## 2012-01-03 MED ORDER — FENTANYL CITRATE 0.05 MG/ML IJ SOLN: 25.0000 ug | INTRAMUSCULAR | Status: DC | PRN

## 2012-01-03 SURGICAL SUPPLY — 38 items
BANDAGE GAUZE ELAST BULKY 4 IN (GAUZE/BANDAGES/DRESSINGS) ×2 IMPLANT
BLADE SURG 15 STRL LF DISP TIS (BLADE) ×1 IMPLANT
BLADE SURG 15 STRL SS (BLADE) ×2
BNDG CMPR 9X4 STRL LF SNTH (GAUZE/BANDAGES/DRESSINGS)
BNDG COHESIVE 3X5 TAN STRL LF (GAUZE/BANDAGES/DRESSINGS) ×2 IMPLANT
BNDG ESMARK 4X9 LF (GAUZE/BANDAGES/DRESSINGS) IMPLANT
CHLORAPREP W/TINT 26ML (MISCELLANEOUS) ×2 IMPLANT
CLOTH BEACON ORANGE TIMEOUT ST (SAFETY) ×2 IMPLANT
CORDS BIPOLAR (ELECTRODE) ×2 IMPLANT
COVER MAYO STAND STRL (DRAPES) ×2 IMPLANT
COVER TABLE BACK 60X90 (DRAPES) ×2 IMPLANT
CUFF TOURNIQUET SINGLE 18IN (TOURNIQUET CUFF) ×2 IMPLANT
DRAPE EXTREMITY T 121X128X90 (DRAPE) ×2 IMPLANT
DRAPE SURG 17X23 STRL (DRAPES) ×2 IMPLANT
DRSG KUZMA FLUFF (GAUZE/BANDAGES/DRESSINGS) ×2 IMPLANT
GAUZE XEROFORM 1X8 LF (GAUZE/BANDAGES/DRESSINGS) ×2 IMPLANT
GLOVE BIO SURGEON STRL SZ 6.5 (GLOVE) ×1 IMPLANT
GLOVE BIOGEL PI IND STRL 8.5 (GLOVE) ×1 IMPLANT
GLOVE BIOGEL PI INDICATOR 8.5 (GLOVE) ×1
GLOVE ECLIPSE 6.5 STRL STRAW (GLOVE) ×1 IMPLANT
GLOVE SURG ORTHO 8.0 STRL STRW (GLOVE) ×2 IMPLANT
GOWN BRE IMP PREV XXLGXLNG (GOWN DISPOSABLE) ×2 IMPLANT
GOWN PREVENTION PLUS XLARGE (GOWN DISPOSABLE) ×2 IMPLANT
NEEDLE 27GAX1X1/2 (NEEDLE) ×1 IMPLANT
NS IRRIG 1000ML POUR BTL (IV SOLUTION) ×2 IMPLANT
PACK BASIN DAY SURGERY FS (CUSTOM PROCEDURE TRAY) ×2 IMPLANT
PAD CAST 3X4 CTTN HI CHSV (CAST SUPPLIES) ×1 IMPLANT
PADDING CAST ABS 4INX4YD NS (CAST SUPPLIES) ×1
PADDING CAST ABS COTTON 4X4 ST (CAST SUPPLIES) ×1 IMPLANT
PADDING CAST COTTON 3X4 STRL (CAST SUPPLIES) ×2
SPONGE GAUZE 4X4 12PLY (GAUZE/BANDAGES/DRESSINGS) ×2 IMPLANT
STOCKINETTE 4X48 STRL (DRAPES) ×2 IMPLANT
SUT VICRYL 4-0 PS2 18IN ABS (SUTURE) IMPLANT
SUT VICRYL RAPIDE 4/0 PS 2 (SUTURE) ×2 IMPLANT
SYR BULB 3OZ (MISCELLANEOUS) ×2 IMPLANT
SYR CONTROL 10ML LL (SYRINGE) ×1 IMPLANT
TOWEL OR 17X24 6PK STRL BLUE (TOWEL DISPOSABLE) ×2 IMPLANT
UNDERPAD 30X30 INCONTINENT (UNDERPADS AND DIAPERS) ×2 IMPLANT

## 2012-01-03 NOTE — Anesthesia Preprocedure Evaluation (Signed)
Anesthesia Evaluation  Patient identified by MRN, date of birth, ID band Patient awake    Reviewed: Allergy & Precautions, H&P , NPO status , Patient's Chart, lab work & pertinent test results  History of Anesthesia Complications (+) PONV  Airway Mallampati: II TM Distance: >3 FB Neck ROM: Full    Dental No notable dental hx. (+) Teeth Intact and Dental Advisory Given   Pulmonary shortness of breath, asthma (inhalers daily) , sleep apnea (doesn't use CPAP) ,  breath sounds clear to auscultation  Pulmonary exam normal       Cardiovascular hypertension, Pt. on medications Rhythm:Regular Rate:Normal     Neuro/Psych negative neurological ROS     GI/Hepatic Neg liver ROS, hiatal hernia, GERD-  Medicated and Poorly Controlled,  Endo/Other  diabetes (glu 144), Well Controlled, Type 2, Insulin Dependent  Renal/GU Renal diseasenegative Renal ROS     Musculoskeletal   Abdominal (+) + obese,   Peds  Hematology   Anesthesia Other Findings   Reproductive/Obstetrics                           Anesthesia Physical Anesthesia Plan  ASA: III  Anesthesia Plan: MAC and Bier Block   Post-op Pain Management:    Induction:   Airway Management Planned: Natural Airway and Simple Face Mask  Additional Equipment:   Intra-op Plan:   Post-operative Plan:   Informed Consent: I have reviewed the patients History and Physical, chart, labs and discussed the procedure including the risks, benefits and alternatives for the proposed anesthesia with the patient or authorized representative who has indicated his/her understanding and acceptance.   Dental advisory given  Plan Discussed with: CRNA and Surgeon  Anesthesia Plan Comments: (Plan routine monitors, IV regional Lidocaine)        Anesthesia Quick Evaluation

## 2012-01-03 NOTE — Transfer of Care (Signed)
Immediate Anesthesia Transfer of Care Note  Patient: Carrie Cook  Procedure(s) Performed: Procedure(s) (LRB) with comments: CARPAL TUNNEL RELEASE (Right)  Patient Location: PACU  Anesthesia Type:Bier block  Level of Consciousness: awake, alert  and oriented  Airway & Oxygen Therapy: Patient Spontanous Breathing  Post-op Assessment: Report given to PACU RN and Post -op Vital signs reviewed and stable  Post vital signs: Reviewed and stable  Complications: No apparent anesthesia complications

## 2012-01-03 NOTE — Brief Op Note (Signed)
01/03/2012  9:17 AM  PATIENT:  Barbette Merino  51 y.o. female  PRE-OPERATIVE DIAGNOSIS:  CTS RIGHT SIDE  POST-OPERATIVE DIAGNOSIS:  Carpal Tunnel Syndrome Right  PROCEDURE:  Procedure(s) (LRB) with comments: CARPAL TUNNEL RELEASE (Right)  SURGEON:  Surgeon(s) and Role:    * Nicki Reaper, MD - Primary  PHYSICIAN ASSISTANT:   ASSISTANTS: none   ANESTHESIA:   local and regional  EBL:  Total I/O In: 700 [I.V.:700] Out: -   BLOOD ADMINISTERED:none  DRAINS: none   LOCAL MEDICATIONS USED:  MARCAINE     SPECIMEN:  No Specimen  DISPOSITION OF SPECIMEN:  N/A  COUNTS:  YES  TOURNIQUET:   Total Tourniquet Time Documented: Forearm (Right) - 16 minutes  DICTATION: .Other Dictation: Dictation Number [id number]  PLAN OF CARE: Discharge to home after PACU  PATIENT DISPOSITION:  PACU - hemodynamically stable.

## 2012-01-03 NOTE — Anesthesia Postprocedure Evaluation (Signed)
  Anesthesia Post-op Note  Patient: Carrie Cook  Procedure(s) Performed: Procedure(s) (LRB) with comments: CARPAL TUNNEL RELEASE (Right)  Patient Location: PACU  Anesthesia Type:MAC and Bier block  Level of Consciousness: awake, alert , oriented and patient cooperative  Airway and Oxygen Therapy: Patient Spontanous Breathing  Post-op Pain: none  Post-op Assessment: Post-op Vital signs reviewed, Patient's Cardiovascular Status Stable, Respiratory Function Stable, Patent Airway, No signs of Nausea or vomiting and Pain level controlled  Post-op Vital Signs: Reviewed and stable  Complications: No apparent anesthesia complications

## 2012-01-03 NOTE — H&P (Signed)
Carrie Cook is a 51 year old right hand dominant female complaining ofnumbness and tingling both hands, right greater than left all fingers on a relatively constant basis on her right side. She has a history of carpal tunnel syndrome with positive nerve conductions done in 2000 by Dr. Huston Foley. She did not have surgery. She had injections done at that time and it has not resolved. She developed a trigger finger approximately 5 years ago left thumb. This resolved after injection. She has diabetes and arthritis. She has no history of thyroid problems or gout. She is awakened 7 out of 7 nights. She has no history of injury to the hands or neck. 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 She complains of intermittent severe, throbbing, aching and burning pain with a feeling of swelling numbness and weakness. She states it is gradually getting worse. Activity makes it worse. She has been taking ibuprofen and Tylenol.. She has had her nerve conductions done and this reveals a motor delay of greater than 8 on her right, 5.3 on the left, sensory delay of 3.3 on the right and 2.8 on the left with a significant diminution in amplitude bilaterally.  PAST MEDICAL HISTORY: She is allergic to sulfa, Codeine, several BP medicines and Prozac. She is on the following medications: Aciphex, Amlodipine, Humalog, Lantus, Symbicort, Valsartan/HCTZ, Venlafaxine HCL, Ventolin, Claritin, aspirin and Pravastatin.  She has had a laparoscopy and tubal ligation.  FAMILY H ISTORY: Positive for diabetes, heart disease, high BP and arthritis.  SOCIAL HISTORY: She does not smoke or drink. She is married.  REVIEW OF SYSTEMS: Positive for double vision, high BP, asthma, pneumonia, shortness of breath, stomach ulcer, blood in her urine, rash, balance problems, headaches, depression, sleep disorder and anemia.  Carrie Cook is an 51 y.o. female.   Chief  Complaint: CTS Rt HPI: see above  Past Medical History  Diagnosis Date  . Chest pain   . Hyperlipidemia     Lipid profile in 12/2010:184, 294, 36, 89  . Diabetes mellitus 2005    Onset in 2005; requires no insulin; 12/2010-A1c of 10.2  . Obesity   . Gastroesophageal reflux disease   . Nephrolithiasis   . Asthma   . Endometriosis     laparoscopy x2  . Hypertension   . Degenerative joint disease     Right knee  . Anxiety   . Depression   . Shortness of breath     related to asthma per pt  . Sleep apnea     does not use CPAP   . H/O hiatal hernia   . Carpal tunnel syndrome     Bilateral  . PONV (postoperative nausea and vomiting)     Past Surgical History  Procedure Date  . Tubal ligation 1993  . Pelvic laparoscopy     x2 for endometriosis  . Colonoscopy 05/2009    Family History  Problem Relation Age of Onset  . Hypertension Father   . Hyperlipidemia Father   . COPD Mother   . Heart disease Mother   . Cancer Mother     Cervix  . Thyroid disease Sister    Social History:  reports that she has never smoked. She has never used smokeless tobacco. She reports that she does not drink alcohol or use illicit drugs.  Allergies:  Allergies  Allergen Reactions  . Codeine Nausea And Vomiting  . Dyazide (Hydrochlorothiazide W-Triamterene)   .  Erythromycin Nausea Only  . Lisinopril     REACTION: cough  . Prozac (Fluoxetine Hcl)     REACTION: hives  . Sulfonamide Derivatives Rash    Medications Prior to Admission  Medication Sig Dispense Refill  . albuterol (PROVENTIL HFA;VENTOLIN HFA) 108 (90 BASE) MCG/ACT inhaler Inhale 2 puffs into the lungs every 6 (six) hours as needed.        Marland Kitchen amLODipine (NORVASC) 5 MG tablet Take 1 tablet (5 mg total) by mouth daily.  90 tablet  3  . aspirin 81 MG tablet Take 81 mg by mouth daily.        . budesonide-formoterol (SYMBICORT) 160-4.5 MCG/ACT inhaler Inhale 2 puffs into the lungs 2 (two) times daily as needed.       .  Canagliflozin (INVOKANA) 100 MG TABS Take by mouth daily.      Marland Kitchen estradiol (ESTRACE) 2 MG tablet Take 2 mg by mouth daily.      . insulin glargine (LANTUS SOLOSTAR) 100 UNIT/ML injection Inject into the skin at bedtime.      . insulin lispro (HUMALOG PEN) 100 UNIT/ML injection Inject into the skin 3 (three) times daily before meals.      Marland Kitchen loratadine (CLARITIN) 10 MG tablet Take 10 mg by mouth daily.      . medroxyPROGESTERone (PROVERA) 5 MG tablet Take 5 mg by mouth daily.      . Omega-3 Fatty Acids (FISH OIL) 1000 MG CAPS Take 1,000 mg by mouth 2 (two) times daily.        . pravastatin (PRAVACHOL) 20 MG tablet Take 20 mg by mouth daily.      . RABEprazole (ACIPHEX) 20 MG tablet Take 20 mg by mouth daily.        . valsartan-hydrochlorothiazide (DIOVAN-HCT) 320-25 MG per tablet Take 1 tablet by mouth daily.  90 tablet  3  . venlafaxine XR (EFFEXOR XR) 150 MG 24 hr capsule Take 150 mg by mouth daily.      Marland Kitchen ALPRAZolam (XANAX) 0.5 MG tablet Take 0.5 mg by mouth at bedtime as needed.         Results for orders placed during the hospital encounter of 01/03/12 (from the past 48 hour(s))  BASIC METABOLIC PANEL     Status: Abnormal   Collection Time   01/02/12  2:30 PM      Component Value Range Comment   Sodium 136  135 - 145 mEq/L    Potassium 3.7  3.5 - 5.1 mEq/L    Chloride 98  96 - 112 mEq/L    CO2 25  19 - 32 mEq/L    Glucose, Bld 226 (*) 70 - 99 mg/dL    BUN 12  6 - 23 mg/dL    Creatinine, Ser 4.54  0.50 - 1.10 mg/dL    Calcium 9.3  8.4 - 09.8 mg/dL    GFR calc non Af Amer >90  >90 mL/min    GFR calc Af Amer >90  >90 mL/min   GLUCOSE, CAPILLARY     Status: Abnormal   Collection Time   01/03/12  7:18 AM      Component Value Range Comment   Glucose-Capillary 144 (*) 70 - 99 mg/dL     No results found.   Pertinent items are noted in HPI.  Blood pressure 155/80, pulse 107, temperature 98.4 F (36.9 C), temperature source Oral, resp. rate 18, height 5\' 3"  (1.6 m), weight 105.235  kg (232 lb), SpO2 97.00%.  General appearance:  cooperative and appears stated age Head: Normocephalic, without obvious abnormality Neck: no adenopathy Resp: clear to auscultation bilaterally Cardio: regular rate and rhythm, S1, S2 normal, no murmur, click, rub or gallop GI: soft, non-tender; bowel sounds normal; no masses,  no organomegaly Extremities: extremities normal, atraumatic, no cyanosis or edema Pulses: 2+ and symmetric Skin: Skin color, texture, turgor normal. No rashes or lesions Neurologic: Grossly normal Incision/Wound: na  Assessment/Plan We have discussed with her the possibility of surgical decompression of the right carpal tunnel. The pre, peri and post op course are discussed along with risks and complications.  She is aware there is no guarantee with surgery, possibility of infection, recurrence, injury to arteries, nerves and tendons, incomplete relief of symptoms and dystrophy.  She would like to go ahead and have right carpal tunnel release performed. I would not rush her into surgery on the left [facility] joint. Questions were invited and answered in detail. She is scheduled for right carpal tunnel release as an outpatient.  Marjani Kobel R 01/03/2012, 8:35 AM

## 2012-01-03 NOTE — Op Note (Signed)
Dictated number: [id number]

## 2012-01-04 ENCOUNTER — Encounter (HOSPITAL_BASED_OUTPATIENT_CLINIC_OR_DEPARTMENT_OTHER): Payer: Self-pay | Admitting: Orthopedic Surgery

## 2012-01-04 LAB — POCT HEMOGLOBIN-HEMACUE: Hemoglobin: 9.4 g/dL — ABNORMAL LOW (ref 12.0–15.0)

## 2012-01-04 NOTE — Op Note (Signed)
NAMEDMIYA, MALPHRUS NO.:  [id number]  MEDICAL RECORD NO.:  [id number]  LOCATION:                                 FACILITY:  PHYSICIAN:  Cindee Salt, M.D.            DATE OF BIRTH:  DATE OF PROCEDURE:  01/03/2012 DATE OF DISCHARGE:                              OPERATIVE REPORT   PREOPERATIVE DIAGNOSIS:  Carpal tunnel syndrome, right hand.  POSTOPERATIVE DIAGNOSIS:  Carpal tunnel syndrome, right hand.  OPERATION:  Decompression, right median nerve.  SURGEON:  Cindee Salt, MD  ANESTHESIA:  Forearm-based IV regional with local infiltration.  ANESTHESIOLOGIST:  Germaine Pomfret, MD  HISTORY:  The patient is a 51 year old female with a history of carpal tunnel syndrome, EMG nerve conductions positive, unresponsive to conservative treatment.  She has elected to undergo surgical decompression of median nerve.  Pre, peri, and postoperative course have been discussed along with risks and complications.  She is aware that there is no guarantee with the surgery; possibility of infection; recurrence of injury to arteries, nerves, tendons, incomplete relief of symptoms, dystrophy.  In the preoperative area, the patient is seen, the extremity marked by both patient and surgeon.  Antibiotic given.  PROCEDURE:  The patient was brought to the operating room where forearm- based IV regional anesthetic was carried out without difficulty.  She was prepped using ChloraPrep, supine position with the right arm free. A 3-minute dry time was allowed.  Time-out taken, confirming patient and procedure.  After adequate anesthesia was afforded, a longitudinal incision was made in the palm, right hand, carried down through subcutaneous tissue.  Bleeders were electrocauterized.  Palmar fascia was split.  Superficial palmar arch identified.  The flexor tendon to the ring little finger identified to the ulnar side of the median nerve. The carpal retinaculum was incised with sharp  dissection.  Right angle and Sewall retractor were placed between skin and forearm fascia.  The fascia released for approximately a centimeter and half proximal to the wrist crease under direct vision.  Canal was explored.  Air compression of the nerve was apparent.  No further lesions were identified.  The wound was irrigated.  The skin was closed with interrupted 4-0 Vicryl Rapide sutures.  Local infiltration with 0.25% Marcaine without epinephrine was given, approximately 8 mL was used.  A sterile compressive dressing with fingers free was applied.  On deflation of the tourniquet, all fingers immediately pinked.  She was taken to the recovery room for observation in satisfactory condition.          ______________________________ Cindee Salt, M.D.     GK/MEDQ  D:  01/03/2012  T:  01/03/2012  Job:  [id number]

## 2012-01-15 DIAGNOSIS — G4730 Sleep apnea, unspecified: Secondary | ICD-10-CM | POA: Diagnosis not present

## 2012-01-15 DIAGNOSIS — E119 Type 2 diabetes mellitus without complications: Secondary | ICD-10-CM | POA: Diagnosis not present

## 2012-01-16 ENCOUNTER — Other Ambulatory Visit: Payer: Self-pay | Admitting: Cardiology

## 2012-01-17 ENCOUNTER — Other Ambulatory Visit: Payer: Self-pay | Admitting: Cardiology

## 2012-01-18 NOTE — Telephone Encounter (Signed)
Fax Received. Refill Completed. Parris Signer Chowoe (R.M.A)   

## 2012-01-22 DIAGNOSIS — F332 Major depressive disorder, recurrent severe without psychotic features: Secondary | ICD-10-CM | POA: Diagnosis not present

## 2012-02-07 ENCOUNTER — Other Ambulatory Visit: Payer: Self-pay

## 2012-02-07 DIAGNOSIS — G4700 Insomnia, unspecified: Secondary | ICD-10-CM

## 2012-02-11 ENCOUNTER — Ambulatory Visit: Payer: Medicare Other | Attending: Family Medicine | Admitting: Sleep Medicine

## 2012-02-11 DIAGNOSIS — G4700 Insomnia, unspecified: Secondary | ICD-10-CM | POA: Diagnosis not present

## 2012-02-11 DIAGNOSIS — G4733 Obstructive sleep apnea (adult) (pediatric): Secondary | ICD-10-CM | POA: Insufficient documentation

## 2012-02-11 DIAGNOSIS — Z6841 Body Mass Index (BMI) 40.0 and over, adult: Secondary | ICD-10-CM | POA: Insufficient documentation

## 2012-02-11 DIAGNOSIS — G4730 Sleep apnea, unspecified: Secondary | ICD-10-CM | POA: Diagnosis not present

## 2012-02-11 DIAGNOSIS — G4710 Hypersomnia, unspecified: Secondary | ICD-10-CM | POA: Diagnosis not present

## 2012-02-16 DIAGNOSIS — E119 Type 2 diabetes mellitus without complications: Secondary | ICD-10-CM | POA: Diagnosis not present

## 2012-02-26 NOTE — Procedures (Signed)
[facility] Carmell Elgin A. Gerilyn Pilgrim, MD     [email or web address]        NAME:  Carrie Cook, Carrie Cook              ACCOUNT NO.:  [id number]  MEDICAL RECORD NO.:  [id number]          PATIENT TYPE:  OUT  LOCATION:  SLEEP LAB                     FACILITY:  [facility]  PHYSICIAN:  Ansh Fauble A. Gerilyn Pilgrim, M.D. DATE OF BIRTH:  1960-06-21  DATE OF STUDY:  02/11/2012                           NOCTURNAL POLYSOMNOGRAM  REFERRING PHYSICIAN:  Scott A. Gerda Diss, MD  INDICATION:  This is a 52 year old who presents with hypersmonia, obesity, fatigue and snoring.  MEDICATIONS:  Aspirin, insulin, Norvasc, Provera, Diovan, Ventolin, pravastatin, Claritin, Effexor,  AcipHex, Symbicort.  EPWORTH SLEEPINESS SCALE:  18.  BMI:  41.  ARCHITECTURAL SUMMARY:  This is a full night recording  conducted for 475 minutes.  The sleep efficiency is 51%, sleep latency 85 minutes. REM latency 351 minutes.  Stage N1 22%, N2 34%, N3 29% and REM sleep 15%.  RESPIRATORY SUMMARY:  Baseline oxygen saturation is 95, lowest saturation 80 during REM sleep.  Diagnostic AHI is 12 and RDI also 12.  LIMB MOVEMENT SUMMARY:  PLM index is 87.  ELECTROCARDIOGRAM SUMMARY:  Average heart rate is 100 with no significant dysrhythmias observed.  IMPRESSION: 1. Mild obstructive sleep apnea syndrome. 2. Severe periodic limb movement disorder sleep.  RECOMMENDATIONS: 1. Consider positive pressure titration. 2. Consider dopamine agonist such as ReQuip or Mirapex.  Thanks for this referral.   Grove Defina A. Gerilyn Pilgrim, M.D.    KAD/MEDQ  D:  02/26/2012 09:30:57  T:  02/26/2012 09:51:47  Job:  [id number]

## 2012-03-20 DIAGNOSIS — E119 Type 2 diabetes mellitus without complications: Secondary | ICD-10-CM | POA: Diagnosis not present

## 2012-03-20 DIAGNOSIS — G4730 Sleep apnea, unspecified: Secondary | ICD-10-CM | POA: Diagnosis not present

## 2012-03-21 DIAGNOSIS — Z79899 Other long term (current) drug therapy: Secondary | ICD-10-CM | POA: Diagnosis not present

## 2012-03-21 DIAGNOSIS — R5381 Other malaise: Secondary | ICD-10-CM | POA: Diagnosis not present

## 2012-04-12 ENCOUNTER — Encounter: Payer: Self-pay | Admitting: *Deleted

## 2012-04-12 ENCOUNTER — Ambulatory Visit: Payer: Medicare Other | Attending: Family Medicine | Admitting: Sleep Medicine

## 2012-04-12 DIAGNOSIS — G4710 Hypersomnia, unspecified: Secondary | ICD-10-CM

## 2012-04-12 DIAGNOSIS — Z6841 Body Mass Index (BMI) 40.0 and over, adult: Secondary | ICD-10-CM | POA: Insufficient documentation

## 2012-04-12 DIAGNOSIS — G4730 Sleep apnea, unspecified: Secondary | ICD-10-CM | POA: Diagnosis not present

## 2012-04-12 DIAGNOSIS — G4733 Obstructive sleep apnea (adult) (pediatric): Secondary | ICD-10-CM | POA: Insufficient documentation

## 2012-04-15 DIAGNOSIS — F332 Major depressive disorder, recurrent severe without psychotic features: Secondary | ICD-10-CM | POA: Diagnosis not present

## 2012-04-18 NOTE — Procedures (Signed)
[facility] Katelynn Heidler A. Gerilyn Pilgrim, MD     [email or web address]        NAME:  Carrie Cook, Carrie Cook              ACCOUNT NO.:  [id number]  MEDICAL RECORD NO.:  [id number]          PATIENT TYPE:  OUT  LOCATION:  SLEEP LAB                     FACILITY:  [facility]  PHYSICIAN:  Andreas Sobolewski A. Gerilyn Pilgrim, M.D. DATE OF BIRTH:  02/06/1960  DATE OF STUDY:  04/12/2012                           NOCTURNAL POLYSOMNOGRAM  REFERRING PHYSICIAN:  Scott A. Luking, MD   INDICATION:  A 52 year old, who has a known history of obstructive sleep apnea syndrome documented on the previous nocturnal polysomnography.   MEDICATIONS:  None.  EPWORTH SLEEPINESS SCALE: 1.   BMI 41.  ARCHITECTURAL SUMMARY:  This is a full-night CPAP titration recording. The total recording time is 393 minutes.  Sleep efficiency 86%, sleep latency 19 minutes.  REM latency 238 minutes.  Stage N1 is 10%, N2 is 50%, N3 is 26%, and REM sleep 12%.  RESPIRATORY DATA:  Baseline oxygen saturation is 97, lowest saturation 85, during non-REM sleep.  The patient was placed on positive pressure at 5 and titrated to 12, optimal pressure is 12 with resolution of obstructive events and good tolerance.  LIMB MOVEMENT SUMMARY:  PLM index is 71.  She had a lot of movement throughout the titration study, both before and during the optimal pressure.  ELECTROCARDIOGRAM SUMMARY:  Average heart rate is 99 with no significant dysrhythmias observed.  IMPRESSION: 1. Obstructive sleep apnea syndrome, which responds well to a     continuous positive airway pressure of 12. 2. Severe periodic limb movement disorder sleep.  Consider dopamine     agonist such as ReQuip or Mirapex.  She continues to have symptoms     on positive pressure.  Thanks for this referral.   Evaristo Tsuda A. Gerilyn Pilgrim, M.D.    KAD/MEDQ  D:  04/18/2012 08:49:45  T:  04/18/2012 09:39:18  Job:  [id number]

## 2012-04-19 ENCOUNTER — Ambulatory Visit (INDEPENDENT_AMBULATORY_CARE_PROVIDER_SITE_OTHER): Payer: Medicare Other | Admitting: Family Medicine

## 2012-04-19 ENCOUNTER — Encounter: Payer: Self-pay | Admitting: Family Medicine

## 2012-04-19 VITALS — BP 122/84 | HR 80 | Ht 63.0 in | Wt 223.6 lb

## 2012-04-19 DIAGNOSIS — E1165 Type 2 diabetes mellitus with hyperglycemia: Secondary | ICD-10-CM

## 2012-04-19 DIAGNOSIS — I10 Essential (primary) hypertension: Secondary | ICD-10-CM

## 2012-04-19 DIAGNOSIS — E118 Type 2 diabetes mellitus with unspecified complications: Secondary | ICD-10-CM | POA: Diagnosis not present

## 2012-04-19 NOTE — Patient Instructions (Signed)
Increase levimir to 140  If low spells may need to decrease and notify us

## 2012-04-19 NOTE — Progress Notes (Signed)
  Subjective:    Patient ID: Carrie Cook, female    DOB: 05/05/1960, 52 y.o.   MRN: [id number]  Diabetes She presents for her follow-up diabetic visit. She has type 2 diabetes mellitus. The initial diagnosis of diabetes was made 9 years ago. Her disease course has been worsening. There are no hypoglycemic associated symptoms. Associated symptoms include fatigue, polydipsia, visual change and weakness. Pertinent negatives for diabetes include no blurred vision, no chest pain and no foot paresthesias. Symptoms are worsening. Symptoms have been present for 2 weeks. Pertinent negatives for diabetic complications include no CVA or heart disease. Risk factors for coronary artery disease include diabetes mellitus, dyslipidemia, hypertension and family history. Current diabetic treatment includes diet, insulin injections and intensive insulin program. She is compliant with treatment all of the time. Her weight is fluctuating minimally. She is following a diabetic and generally healthy diet. Meal planning includes avoidance of concentrated sweets and calorie counting. She has not had a previous visit with a dietician. She participates in exercise daily. There is no change in her home blood glucose trend.      Review of Systems  Constitutional: Positive for fatigue.  Eyes: Negative for blurred vision.  Cardiovascular: Negative for chest pain.  Endocrine: Positive for polydipsia.  Neurological: Positive for weakness.       Objective:   Physical Exam  Constitutional: She appears well-developed.  HENT:  Head: Normocephalic.  Neck: Normal range of motion. No thyromegaly present.  Cardiovascular: Normal rate, regular rhythm and normal heart sounds.   Pulmonary/Chest: Effort normal and breath sounds normal. No respiratory distress.  Musculoskeletal: She exhibits no edema.  Lymphadenopathy:    She has no cervical adenopathy.  Neurological: She is alert.  Skin: Skin is warm and dry. No erythema.           Assessment & Plan:  Hypertension  Type II or unspecified type diabetes mellitus with unspecified complication, uncontrolled  Pressure under good control continue current medications Her diabetes is not under as good control as we would like to increase Lantus she actually uses Levemir 140 units per night she will send [place] some readings periodically. She will continue  Low starch diet regular exercise try to lose weight she's actually lost a few pounds She is a great candidate for a gastric bypass we will see her one more month then put together all of her past 6 months visit along with a letter of support.

## 2012-04-25 DIAGNOSIS — M6530 Trigger finger, unspecified finger: Secondary | ICD-10-CM | POA: Diagnosis not present

## 2012-05-02 ENCOUNTER — Telehealth: Payer: Self-pay | Admitting: Family Medicine

## 2012-05-02 NOTE — Telephone Encounter (Signed)
Pt states that medicare is requesting an order for [employer] on her cpap machine and sleep study results.  Fax to [phone] off # [phone] for [employer] Please call pt with any questions you may have

## 2012-05-03 NOTE — Telephone Encounter (Signed)
Please fax a written prescription that are done. Please fill in the CPAP pressure. Please also fax the requested information from the message

## 2012-05-03 NOTE — Telephone Encounter (Signed)
Written prescription faxed to [facility] with pressure listed. Patient notified.

## 2012-05-06 ENCOUNTER — Telehealth: Payer: Self-pay | Admitting: Family Medicine

## 2012-05-06 NOTE — Telephone Encounter (Signed)
Patient says that her insurance isnt covering her insulin anymore and she cant afford it every month. She would like a nurse to call her back about possibly switching to one of the [facility] Insulins.

## 2012-05-06 NOTE — Telephone Encounter (Signed)
Prescription called [facility] and pt notified. Pt to call with bs readings.

## 2012-05-06 NOTE — Telephone Encounter (Signed)
Pt notified that she would have to use more of the relion. Pt is ok with that. She stated she has contacted the prescription assistance program but has not heard anything back yet so she would like to go ahead and switch insulin. [facility] [place]. She uses 140 of levemir and 10-20 units of humalog.

## 2012-05-06 NOTE — Telephone Encounter (Signed)
Pt takes levemir 130 units and humalog 10-20 units tid sliding scale. Pt wants to switch to something cheaper because she is in her doughnut hole. [facility] uses [facility] in [place]. Pharm stated relion would be cheaper.

## 2012-05-06 NOTE — Telephone Encounter (Signed)
Relion 70/30  May have 30 day supply and 4 refills  Start with 50 in am and 40 at supper . May have to gradually titrate up to 60 in am  And 50 at pm .

## 2012-05-09 ENCOUNTER — Telehealth: Payer: Self-pay | Admitting: Family Medicine

## 2012-05-09 NOTE — Telephone Encounter (Signed)
Mandy from [place] called 05/08/12 needing OV note, sleep study, and demographics for patient's CPAP.  Faxed info requested

## 2012-05-17 ENCOUNTER — Other Ambulatory Visit: Payer: Self-pay | Admitting: Family Medicine

## 2012-05-19 ENCOUNTER — Other Ambulatory Visit: Payer: Self-pay | Admitting: Cardiology

## 2012-05-19 ENCOUNTER — Other Ambulatory Visit: Payer: Self-pay | Admitting: Obstetrics & Gynecology

## 2012-05-20 ENCOUNTER — Ambulatory Visit (INDEPENDENT_AMBULATORY_CARE_PROVIDER_SITE_OTHER): Payer: Medicare Other | Admitting: Family Medicine

## 2012-05-20 ENCOUNTER — Encounter: Payer: Self-pay | Admitting: Family Medicine

## 2012-05-20 VITALS — BP 122/88 | HR 90 | Ht 63.0 in | Wt 229.1 lb

## 2012-05-20 DIAGNOSIS — E1165 Type 2 diabetes mellitus with hyperglycemia: Secondary | ICD-10-CM

## 2012-05-20 DIAGNOSIS — E118 Type 2 diabetes mellitus with unspecified complications: Secondary | ICD-10-CM | POA: Diagnosis not present

## 2012-05-20 NOTE — Progress Notes (Signed)
  Subjective:    Patient ID: Carrie Cook, female    DOB: 01-08-1961, 52 y.o.   MRN: [id number]  HPI Patient is in today because of followup regarding obesity and diabetes. She cannot afford her Lantus. She requests medications adjustment for her diabetes. She also is trying to watch her diet and exercise but is having difficult time losing weight.  We talked about dietary measures exercise we also talked about the use of her current insulin 70/30 twice daily to help keep her diabetes under good control. She does use Humalog occasionally when the numbers go up dramatically. Family history dietary history social history review    Review of Systemsdenies chest pain shortness of breath.     Objective:   Physical Exam Vital signs noted. Lungs are clear heart is regular pulse normal. Extremities no edema.       Assessment & Plan:  Diabetes-continue current measures adjustments were discussed with the patient #2 obesity-patient would benefit from having weight reduction surgery she should be able to get this approved see her next month then help her from pile report for submission.

## 2012-06-18 ENCOUNTER — Other Ambulatory Visit: Payer: Self-pay | Admitting: Obstetrics & Gynecology

## 2012-06-19 ENCOUNTER — Ambulatory Visit: Payer: Medicare Other | Admitting: Family Medicine

## 2012-06-21 ENCOUNTER — Ambulatory Visit (INDEPENDENT_AMBULATORY_CARE_PROVIDER_SITE_OTHER): Payer: Medicare Other | Admitting: Family Medicine

## 2012-06-21 ENCOUNTER — Encounter: Payer: Self-pay | Admitting: Family Medicine

## 2012-06-21 VITALS — BP 130/90 | HR 80 | Ht 63.0 in | Wt 233.2 lb

## 2012-06-21 DIAGNOSIS — E119 Type 2 diabetes mellitus without complications: Secondary | ICD-10-CM | POA: Diagnosis not present

## 2012-06-21 DIAGNOSIS — G4733 Obstructive sleep apnea (adult) (pediatric): Secondary | ICD-10-CM | POA: Insufficient documentation

## 2012-06-21 DIAGNOSIS — I10 Essential (primary) hypertension: Secondary | ICD-10-CM | POA: Diagnosis not present

## 2012-06-21 DIAGNOSIS — E1165 Type 2 diabetes mellitus with hyperglycemia: Secondary | ICD-10-CM | POA: Diagnosis not present

## 2012-06-21 DIAGNOSIS — E118 Type 2 diabetes mellitus with unspecified complications: Secondary | ICD-10-CM

## 2012-06-21 DIAGNOSIS — E785 Hyperlipidemia, unspecified: Secondary | ICD-10-CM | POA: Diagnosis not present

## 2012-06-21 LAB — POCT GLYCOSYLATED HEMOGLOBIN (HGB A1C): Hemoglobin A1C: 7.5

## 2012-06-21 NOTE — Progress Notes (Signed)
  Subjective:    Patient ID: Carrie Cook, female    DOB: [birth year], 52 y.o.   MRN: [id number]  Diabetes She presents for her follow-up diabetic visit. She has type 2 diabetes mellitus. Her disease course has been stable. There are no hypoglycemic associated symptoms. Associated symptoms include fatigue. There are no hypoglycemic complications. Symptoms are stable. There are no diabetic complications. Risk factors for coronary artery disease include diabetes mellitus and hypertension. Current diabetic treatment includes insulin injections and oral agent (monotherapy). She is compliant with treatment all of the time. She is following a generally healthy diet. When asked about meal planning, she reported none. She participates in exercise intermittently.  pt trying diet and exercise, diabetes fair control    Review of Systems  Constitutional: Positive for fatigue.   [facility]    Objective:   Physical Exam  Vitals reviewed. Constitutional:  obese  HENT:  Head: Atraumatic.  Neck: Normal range of motion.  Cardiovascular: Normal rate, regular rhythm and normal heart sounds.   No murmur heard. Pulmonary/Chest: Effort normal and breath sounds normal. No respiratory distress.  Abdominal: Soft.  Musculoskeletal: She exhibits no edema.  Lymphadenopathy:    She has no cervical adenopathy.          Assessment & Plan:  Pravastatin - will try m and Friday, can't tolerate higher doses Obesity- exercise and diet DM- fair control Would benefit from weight loss surgery- will do letter

## 2012-06-21 NOTE — Patient Instructions (Addendum)
Check your list to see if ECHO required ( if it is we can set it up)

## 2012-07-12 ENCOUNTER — Encounter: Payer: Self-pay | Admitting: *Deleted

## 2012-07-12 ENCOUNTER — Encounter (INDEPENDENT_AMBULATORY_CARE_PROVIDER_SITE_OTHER): Payer: Self-pay | Admitting: General Surgery

## 2012-07-12 ENCOUNTER — Ambulatory Visit (INDEPENDENT_AMBULATORY_CARE_PROVIDER_SITE_OTHER): Payer: Medicare Other | Admitting: General Surgery

## 2012-07-12 DIAGNOSIS — D509 Iron deficiency anemia, unspecified: Secondary | ICD-10-CM

## 2012-07-12 DIAGNOSIS — Z6841 Body Mass Index (BMI) 40.0 and over, adult: Secondary | ICD-10-CM

## 2012-07-12 DIAGNOSIS — I10 Essential (primary) hypertension: Secondary | ICD-10-CM

## 2012-07-12 DIAGNOSIS — M2550 Pain in unspecified joint: Secondary | ICD-10-CM

## 2012-07-12 DIAGNOSIS — G4733 Obstructive sleep apnea (adult) (pediatric): Secondary | ICD-10-CM | POA: Diagnosis not present

## 2012-07-12 DIAGNOSIS — E119 Type 2 diabetes mellitus without complications: Secondary | ICD-10-CM | POA: Diagnosis not present

## 2012-07-12 DIAGNOSIS — E785 Hyperlipidemia, unspecified: Secondary | ICD-10-CM

## 2012-07-12 DIAGNOSIS — K219 Gastro-esophageal reflux disease without esophagitis: Secondary | ICD-10-CM

## 2012-07-12 NOTE — Progress Notes (Signed)
Subjective:   morbid obesity  Patient ID: Carrie Cook, female   DOB: Feb 13, 1960, 52 y.o.   MRN: [id number]  HPI Patient is a very pleasant 52 year old female referred by Dr. Gerda Diss for consideration for surgical treatment for morbid obesity. She gives a history of significant struggles with her weight over the last 25 years. Over that time she has been through innumerable efforts at nonsurgical weight loss and has been able to lose as much as 50 pounds at a time. She then however experiences weight regain. In recent years her weight has begun to affect her health in numerous ways. She has developed multiple illnesses directly related to her weight including insulin-dependent diabetes mellitus and she has been on insulin for about a year. She is insulin resistant and requires high doses. Her weight interferes with her ability to enjoy daily activities but she is chiefly concerned with the multiple severe medical diagnoses that have come up related to her weight and her long-term health prospects if she is unable to get her weight to a healthier level. She has been to our initial information seminar and has discussed this extensively with her primary physician and she is aware of the affect of gastric bypass on diabetes and is interested in gastric bypass.  Past Medical History  Diagnosis Date  . Chest pain   . Hyperlipidemia     Lipid profile in 12/2010:184, 294, 36, 89  . Diabetes mellitus 2005    Onset in 2005; requires no insulin; 12/2010-A1c of 10.2  . Obesity   . Gastroesophageal reflux disease   . Nephrolithiasis   . Asthma   . Endometriosis     laparoscopy x2  . Hypertension   . Degenerative joint disease     Right knee  . Anxiety   . Depression   . Shortness of breath     related to asthma per pt  . Sleep apnea     does not use CPAP   . H/O hiatal hernia   . Carpal tunnel syndrome     Bilateral  . PONV (postoperative nausea and vomiting)    Past Surgical History   Procedure Laterality Date  . Tubal ligation  1993  . Pelvic laparoscopy      x2 for endometriosis  . Colonoscopy  05/2009  . Carpal tunnel release  01/03/2012    Procedure: CARPAL TUNNEL RELEASE;  Surgeon: Nicki Reaper, MD;  Location: [facility];  Service: Orthopedics;  Laterality: Right;   Current Outpatient Prescriptions  Medication Sig Dispense Refill  . albuterol (PROVENTIL HFA;VENTOLIN HFA) 108 (90 BASE) MCG/ACT inhaler Inhale 2 puffs into the lungs every 6 (six) hours as needed.        . ALPRAZolam (XANAX) 0.5 MG tablet Take 0.5 mg by mouth at bedtime as needed.       Marland Kitchen amLODipine (NORVASC) 5 MG tablet TAKE ONE TABLET BY MOUTH EVERY DAY  90 tablet  2  . aspirin 81 MG tablet Take 81 mg by mouth daily.        . budesonide-formoterol (SYMBICORT) 160-4.5 MCG/ACT inhaler Inhale 2 puffs into the lungs 2 (two) times daily as needed.       . Canagliflozin (INVOKANA) 100 MG TABS Take by mouth daily.      . fluconazole (DIFLUCAN) 200 MG tablet       . insulin lispro (HUMALOG PEN) 100 UNIT/ML injection Inject into the skin daily. 10-20 units 2 to 3 times daily prn      .  insulin NPH-regular (NOVOLIN 70/30) (70-30) 100 UNIT/ML injection Inject into the skin. Take 60 units in am and 50 units qhs      . loratadine (CLARITIN) 10 MG tablet Take 10 mg by mouth daily.      . medroxyPROGESTERone (PROVERA) 5 MG tablet TAKE ONE TABLET BY MOUTH ONCE DAILY  30 tablet  0  . metoprolol tartrate (LOPRESSOR) 25 MG tablet Take by mouth. Take 1/2 tablet twice daily      . montelukast (SINGULAIR) 10 MG tablet Take 10 mg by mouth at bedtime.      Marland Kitchen omeprazole (PRILOSEC) 20 MG capsule Take 20 mg by mouth daily.      Marland Kitchen RELION SHORT PEN NEEDLES 31G X 8 MM MISC USE AS DIRECTED  100 each  0  . valsartan-hydrochlorothiazide (DIOVAN-HCT) 320-25 MG per tablet TAKE ONE TABLET BY MOUTH EVERY DAY  90 tablet  0  . venlafaxine XR (EFFEXOR XR) 150 MG 24 hr capsule Take 150 mg by mouth daily.      Marland Kitchen estradiol  (ESTRACE) 2 MG tablet TAKE ONE TABLET BY MOUTH ONCE DAILY  30 tablet  0  . Omega-3 Fatty Acids (FISH OIL) 1000 MG CAPS Take 1,000 mg by mouth 2 (two) times daily.         No current facility-administered medications for this visit.   Allergies  Allergen Reactions  . Codeine Nausea And Vomiting  . Dyazide (Hydrochlorothiazide W-Triamterene)   . Erythromycin Nausea Only  . Lisinopril     REACTION: cough  . Prozac (Fluoxetine Hcl)     REACTION: hives  . Pravastatin   . Sulfonamide Derivatives Rash   History  Substance Use Topics  . Smoking status: Never Smoker   . Smokeless tobacco: Never Used  . Alcohol Use: No     Review of Systems  Constitutional: Positive for fatigue.  HENT: Negative.   Eyes: Negative.   Respiratory: Positive for shortness of breath and wheezing.   Cardiovascular: Negative.   Gastrointestinal: Negative for nausea, vomiting, abdominal pain, diarrhea, constipation and blood in stool.       GERD  Musculoskeletal: Positive for myalgias and arthralgias.  Neurological: Negative.        Objective:   Physical Exam BP 120/70  Pulse 72  Temp(Src) 99.2 F (37.3 C) (Temporal)  Resp 16  Ht 5\' 3"  (1.6 m)  Wt 232 lb 9.6 oz (105.507 kg)  BMI 41.21 kg/m2 General: Alert, morbidly obese Caucasian female, in no distress Skin: Warm and dry without rash or infection. HEENT: No palpable masses or thyromegaly. Sclera nonicteric. Pupils equal round and reactive. Oropharynx clear. Lymph nodes: No cervical, supraclavicular, or inguinal nodes palpable. Lungs: Breath sounds clear and equal without increased work of breathing Cardiovascular: Regular rate and rhythm without murmur. No JVD or edema. Peripheral pulses intact. Abdomen: Nondistended. Soft and nontender. No masses palpable. No organomegaly. No palpable hernias. Extremities: No edema or joint swelling or deformity. No chronic venous stasis changes. Neurologic: Alert and fully oriented. Gait normal.     Assessment:     Patient with progressive morbid obesity unresponsive to multiple efforts at medical management who presents with a BMI of 41.2 and multiple comorbidities of insulin-dependent diabetes mellitus, obstructive sleep apnea, hypertension, hyperlipidemia, GERD, degenerative joint disease and asthma. She is really an ideal candidate for surgical intervention due to her multiple potentially reversible comorbidities. I believe there would be very significant medical benefit from surgical weight loss. After our discussion of surgical options currently available the  patient has decided to proceed with laparoscopic Roux-en-Y gastric bypass due to the reasons above. We have discussed the nature of the problem and the risks of remaining morbidly obese. We discussed laparoscopic Roux-en-Y gastric bypass in detail including the nature of the procedure, expected hospitalization and recovery, and major risks of anesthetic complications, bleeding, blood clots and pulmonary emboli, leakage and infection and long-term risks of stricture, ulceration, bowel obstruction, nutritional deficiencies, and failure to lose weight or weight regain. We discussed these problems could lead to death. The patient was given a complete consent form to review and all questions were answered. We will go ahead with preoperative including psychological and nutrition evaluations, H. pylori testing, ultrasound, bone density, and routine lab and x-rays. I will see the patient back following these studies.    Plan:     Preoperative workup for planned gastric bypass as detailed above.

## 2012-07-13 DIAGNOSIS — K219 Gastro-esophageal reflux disease without esophagitis: Secondary | ICD-10-CM | POA: Diagnosis not present

## 2012-07-13 DIAGNOSIS — G4733 Obstructive sleep apnea (adult) (pediatric): Secondary | ICD-10-CM | POA: Diagnosis not present

## 2012-07-13 DIAGNOSIS — I10 Essential (primary) hypertension: Secondary | ICD-10-CM | POA: Diagnosis not present

## 2012-07-13 DIAGNOSIS — M2550 Pain in unspecified joint: Secondary | ICD-10-CM | POA: Diagnosis not present

## 2012-07-13 DIAGNOSIS — E119 Type 2 diabetes mellitus without complications: Secondary | ICD-10-CM | POA: Diagnosis not present

## 2012-07-13 DIAGNOSIS — E785 Hyperlipidemia, unspecified: Secondary | ICD-10-CM | POA: Diagnosis not present

## 2012-07-13 DIAGNOSIS — D509 Iron deficiency anemia, unspecified: Secondary | ICD-10-CM | POA: Diagnosis not present

## 2012-07-13 LAB — COMPREHENSIVE METABOLIC PANEL
ALT: 15 U/L (ref 0–35)
AST: 12 U/L (ref 0–37)
Albumin: 3.8 g/dL (ref 3.5–5.2)
Alkaline Phosphatase: 111 U/L (ref 39–117)
BUN: 10 mg/dL (ref 6–23)
CO2: 21 mEq/L (ref 19–32)
Calcium: 8.9 mg/dL (ref 8.4–10.5)
Chloride: 103 mEq/L (ref 96–112)
Creat: 0.76 mg/dL (ref 0.50–1.10)
Glucose, Bld: 169 mg/dL — ABNORMAL HIGH (ref 70–99)
Potassium: 3.8 mEq/L (ref 3.5–5.3)
Sodium: 139 mEq/L (ref 135–145)
Total Bilirubin: 0.4 mg/dL (ref 0.3–1.2)
Total Protein: 6.6 g/dL (ref 6.0–8.3)

## 2012-07-13 LAB — CBC WITH DIFFERENTIAL/PLATELET
Basophils Absolute: 0 10*3/uL (ref 0.0–0.1)
Basophils Relative: 0 % (ref 0–1)
Eosinophils Absolute: 0.1 10*3/uL (ref 0.0–0.7)
Eosinophils Relative: 1 % (ref 0–5)
HCT: 31.2 % — ABNORMAL LOW (ref 36.0–46.0)
Hemoglobin: 9.4 g/dL — ABNORMAL LOW (ref 12.0–15.0)
Lymphocytes Relative: 19 % (ref 12–46)
Lymphs Abs: 2 10*3/uL (ref 0.7–4.0)
MCH: 18.5 pg — ABNORMAL LOW (ref 26.0–34.0)
MCHC: 30.1 g/dL (ref 30.0–36.0)
MCV: 61.5 fL — ABNORMAL LOW (ref 78.0–100.0)
Monocytes Absolute: 0.7 10*3/uL (ref 0.1–1.0)
Monocytes Relative: 7 % (ref 3–12)
Neutro Abs: 7.5 10*3/uL (ref 1.7–7.7)
Neutrophils Relative %: 73 % (ref 43–77)
Platelets: 420 10*3/uL — ABNORMAL HIGH (ref 150–400)
RBC: 5.07 MIL/uL (ref 3.87–5.11)
RDW: 18 % — ABNORMAL HIGH (ref 11.5–15.5)
WBC: 10.4 10*3/uL (ref 4.0–10.5)

## 2012-07-13 LAB — LIPID PANEL
Cholesterol: 192 mg/dL (ref 0–200)
HDL: 36 mg/dL — ABNORMAL LOW (ref >39)
LDL Cholesterol: 87 mg/dL (ref 0–99)
Total CHOL/HDL Ratio: 5.3 Ratio
Triglycerides: 343 mg/dL — ABNORMAL HIGH (ref <150)
VLDL: 69 mg/dL — ABNORMAL HIGH (ref 0–40)

## 2012-07-13 LAB — HEMOGLOBIN A1C
Hgb A1c MFr Bld: 7.6 % — ABNORMAL HIGH (ref <5.7)
Mean Plasma Glucose: 171 mg/dL — ABNORMAL HIGH (ref <117)

## 2012-07-13 LAB — TSH: TSH: 0.784 u[IU]/mL (ref 0.350–4.500)

## 2012-07-13 LAB — T4: T4, Total: 10.9 ug/dL (ref 5.0–12.5)

## 2012-07-15 ENCOUNTER — Encounter: Payer: Self-pay | Admitting: Obstetrics & Gynecology

## 2012-07-15 ENCOUNTER — Ambulatory Visit (INDEPENDENT_AMBULATORY_CARE_PROVIDER_SITE_OTHER): Payer: Medicare Other | Admitting: Obstetrics & Gynecology

## 2012-07-15 VITALS — BP 130/80 | Ht 62.0 in | Wt 232.0 lb

## 2012-07-15 DIAGNOSIS — Z01419 Encounter for gynecological examination (general) (routine) without abnormal findings: Secondary | ICD-10-CM

## 2012-07-15 DIAGNOSIS — Z124 Encounter for screening for malignant neoplasm of cervix: Secondary | ICD-10-CM | POA: Diagnosis not present

## 2012-07-15 MED ORDER — MEDROXYPROGESTERONE ACETATE 5 MG PO TABS: 5.0000 mg | ORAL_TABLET | Freq: Every day | ORAL | Status: DC

## 2012-07-15 MED ORDER — ESTRADIOL 2 MG PO TABS: 2.0000 mg | ORAL_TABLET | Freq: Every day | ORAL | Status: DC

## 2012-07-16 NOTE — Progress Notes (Signed)
Patient ID: Carrie Cook, female   DOB: 1960/03/04, 52 y.o.   MRN: [id number] Subjective:     Carrie Cook is a 52 y.o. female here for a routine exam.  No LMP recorded. Patient is not currently having periods (Reason: Perimenopausal). G1P1 Current complaints: none.  Personal health questionnaire reviewed: no.   Gynecologic History No LMP recorded. Patient is not currently having periods (Reason: Perimenopausal). Contraception: post menopausal status Last Pap: 2013. Results were: normal Last mammogram: 2012. Results were: normal  Obstetric History OB History   Grav Para Term Preterm Abortions TAB SAB Ect Mult Living   1 1             # Outc Date GA Lbr Len/2nd Wgt Sex Del Anes PTL Lv   1 PAR 1986    M SVD   No       The following portions of the patient's history were reviewed and updated as appropriate: allergies, current medications, past family history, past medical history, past social history, past surgical history and problem list.  Review of Systems  Review of Systems  Constitutional: Negative for fever, chills, weight loss, malaise/fatigue and diaphoresis.  HENT: Negative for hearing loss, ear pain, nosebleeds, congestion, sore throat, neck pain, tinnitus and ear discharge.   Eyes: Negative for blurred vision, double vision, photophobia, pain, discharge and redness.  Respiratory: Negative for cough, hemoptysis, sputum production, shortness of breath, wheezing and stridor.   Cardiovascular: Negative for chest pain, palpitations, orthopnea, claudication, leg swelling and PND.  Gastrointestinal: negative for abdominal pain. Negative for heartburn, nausea, vomiting, diarrhea, constipation, blood in stool and melena.  Genitourinary: Negative for dysuria, urgency, frequency, hematuria and flank pain.  Musculoskeletal: Negative for myalgias, back pain, joint pain and falls.  Skin: Negative for itching and rash.  Neurological: Negative for dizziness, tingling, tremors,  sensory change, speech change, focal weakness, seizures, loss of consciousness, weakness and headaches.  Endo/Heme/Allergies: Negative for environmental allergies and polydipsia. Does not bruise/bleed easily.  Psychiatric/Behavioral: Negative for depression, suicidal ideas, hallucinations, memory loss and substance abuse. The patient is not nervous/anxious and does not have insomnia.        Objective:    Physical Exam  Vitals reviewed. Constitutional: She is oriented to person, place, and time. She appears well-developed and well-nourished.  HENT:  Head: Normocephalic and atraumatic.        Right Ear: External ear normal.  Left Ear: External ear normal.  Nose: Nose normal.  Mouth/Throat: Oropharynx is clear and moist.  Eyes: Conjunctivae and EOM are normal. Pupils are equal, round, and reactive to light. Right eye exhibits no discharge. Left eye exhibits no discharge. No scleral icterus.  Neck: Normal range of motion. Neck supple. No tracheal deviation present. No thyromegaly present.  Cardiovascular: Normal rate, regular rhythm, normal heart sounds and intact distal pulses.  Exam reveals no gallop and no friction rub.   No murmur heard. Respiratory: Effort normal and breath sounds normal. No respiratory distress. She has no wheezes. She has no rales. She exhibits no tenderness.  GI: Soft. Bowel sounds are normal. She exhibits no distension and no mass. There is no tenderness. There is no rebound and no guarding.  Genitourinary:       Vulva is normal without lesions Vagina is pink moist without discharge Cervix normal in appearance and pap is done Uterus is normal size shape and contour Adnexa is negative with normal sized ovaries   Musculoskeletal: Normal range of motion. She exhibits no edema and  no tenderness.  Neurological: She is alert and oriented to person, place, and time. She has normal reflexes. She displays normal reflexes. No cranial nerve deficit. She exhibits normal muscle  tone. Coordination normal.  Skin: Skin is warm and dry. No rash noted. No erythema. No pallor.  Psychiatric: She has a normal mood and affect. Her behavior is normal. Judgment and thought content normal.       Assessment:    Healthy female exam.    Plan:    Mammogram ordered. Follow up in: 1 year.

## 2012-07-18 ENCOUNTER — Other Ambulatory Visit: Payer: Self-pay | Admitting: Family Medicine

## 2012-07-31 ENCOUNTER — Ambulatory Visit (HOSPITAL_COMMUNITY)
Admission: RE | Admit: 2012-07-31 | Discharge: 2012-07-31 | Disposition: A | Discharge status: Home / Self Care | Payer: Medicare Other | Source: Ambulatory Visit | Attending: General Surgery | Admitting: General Surgery

## 2012-07-31 ENCOUNTER — Other Ambulatory Visit: Payer: Self-pay

## 2012-07-31 DIAGNOSIS — E119 Type 2 diabetes mellitus without complications: Secondary | ICD-10-CM

## 2012-07-31 DIAGNOSIS — N200 Calculus of kidney: Secondary | ICD-10-CM | POA: Insufficient documentation

## 2012-07-31 DIAGNOSIS — D509 Iron deficiency anemia, unspecified: Secondary | ICD-10-CM

## 2012-07-31 DIAGNOSIS — K449 Diaphragmatic hernia without obstruction or gangrene: Secondary | ICD-10-CM | POA: Insufficient documentation

## 2012-07-31 DIAGNOSIS — K219 Gastro-esophageal reflux disease without esophagitis: Secondary | ICD-10-CM

## 2012-07-31 DIAGNOSIS — G4733 Obstructive sleep apnea (adult) (pediatric): Secondary | ICD-10-CM

## 2012-07-31 DIAGNOSIS — E785 Hyperlipidemia, unspecified: Secondary | ICD-10-CM

## 2012-07-31 DIAGNOSIS — I10 Essential (primary) hypertension: Secondary | ICD-10-CM

## 2012-07-31 DIAGNOSIS — M1710 Unilateral primary osteoarthritis, unspecified knee: Secondary | ICD-10-CM | POA: Diagnosis not present

## 2012-07-31 DIAGNOSIS — Z1231 Encounter for screening mammogram for malignant neoplasm of breast: Secondary | ICD-10-CM | POA: Insufficient documentation

## 2012-07-31 DIAGNOSIS — M2550 Pain in unspecified joint: Secondary | ICD-10-CM

## 2012-07-31 DIAGNOSIS — F332 Major depressive disorder, recurrent severe without psychotic features: Secondary | ICD-10-CM | POA: Diagnosis not present

## 2012-07-31 DIAGNOSIS — Z01818 Encounter for other preprocedural examination: Secondary | ICD-10-CM | POA: Diagnosis not present

## 2012-07-31 DIAGNOSIS — Z6841 Body Mass Index (BMI) 40.0 and over, adult: Secondary | ICD-10-CM | POA: Insufficient documentation

## 2012-08-01 ENCOUNTER — Other Ambulatory Visit (INDEPENDENT_AMBULATORY_CARE_PROVIDER_SITE_OTHER): Payer: Self-pay | Admitting: General Surgery

## 2012-08-01 DIAGNOSIS — R928 Other abnormal and inconclusive findings on diagnostic imaging of breast: Secondary | ICD-10-CM

## 2012-08-12 ENCOUNTER — Encounter: Payer: Medicare Other | Attending: General Surgery | Admitting: *Deleted

## 2012-08-12 ENCOUNTER — Encounter: Payer: Self-pay | Admitting: *Deleted

## 2012-08-12 DIAGNOSIS — Z713 Dietary counseling and surveillance: Secondary | ICD-10-CM | POA: Insufficient documentation

## 2012-08-12 NOTE — Progress Notes (Addendum)
  Pre-Op Assessment Visit:  Pre-Operative RYGB Surgery  Medical Nutrition Therapy:  Appt start time: 1500   End time:  1600.  Patient was seen on 08/12/2012 for Pre-Operative Gastric Bypass Nutrition Assessment. Assessment and letter of approval faxed to [facility] coordinator on 08/12/2012.   Handouts given during visit include:  Pre-Op Goals Bariatric Surgery Protein Shakes  Samples given during visit include:   Unjury Protein Powder - 1 pkt each Lot: [id number]; Exp: 09/15 Lot: [id number]; Exp: 09/15  Premier Protein Shake - 1 bottle Lot: [id number]; Exp: 02/05/12  Patient to call the [facility] to enroll in Pre-Op and Post-Op Nutrition Education when surgery date is scheduled.

## 2012-08-12 NOTE — Patient Instructions (Addendum)
   Follow Pre-Op Nutrition Goals to prepare for Gastric Bypass Surgery.   Call the [facility] at [phone] once you have been given your surgery date to enrolled in the Pre-Op Nutrition Class. You will need to attend this nutrition class 3-4 weeks prior to your surgery. 

## 2012-08-13 ENCOUNTER — Ambulatory Visit (HOSPITAL_COMMUNITY)
Admission: RE | Admit: 2012-08-13 | Discharge: 2012-08-13 | Disposition: A | Discharge status: Home / Self Care | Payer: Medicare Other | Source: Ambulatory Visit | Attending: General Surgery | Admitting: General Surgery

## 2012-08-13 ENCOUNTER — Ambulatory Visit
Admission: RE | Admit: 2012-08-13 | Discharge: 2012-08-13 | Disposition: A | Discharge status: Home / Self Care | Payer: Medicare Other | Source: Ambulatory Visit | Attending: General Surgery | Admitting: General Surgery

## 2012-08-13 ENCOUNTER — Encounter (HOSPITAL_COMMUNITY)
Admission: RE | Disposition: A | Discharge status: Home / Self Care | Payer: Self-pay | Source: Ambulatory Visit | Attending: General Surgery

## 2012-08-13 DIAGNOSIS — N6009 Solitary cyst of unspecified breast: Secondary | ICD-10-CM | POA: Diagnosis not present

## 2012-08-13 DIAGNOSIS — Z01818 Encounter for other preprocedural examination: Secondary | ICD-10-CM | POA: Insufficient documentation

## 2012-08-13 DIAGNOSIS — R928 Other abnormal and inconclusive findings on diagnostic imaging of breast: Secondary | ICD-10-CM

## 2012-08-13 HISTORY — PX: BREATH TEK H PYLORI: SHX5422

## 2012-08-13 SURGERY — BREATH TEST, FOR HELICOBACTER PYLORI

## 2012-08-14 ENCOUNTER — Encounter (HOSPITAL_COMMUNITY): Payer: Self-pay | Admitting: General Surgery

## 2012-08-15 ENCOUNTER — Other Ambulatory Visit: Payer: Self-pay | Admitting: *Deleted

## 2012-08-15 MED ORDER — VALSARTAN-HYDROCHLOROTHIAZIDE 320-25 MG PO TABS: ORAL_TABLET | ORAL | Status: DC

## 2012-08-16 DIAGNOSIS — F3131 Bipolar disorder, current episode depressed, mild: Secondary | ICD-10-CM | POA: Diagnosis not present

## 2012-08-22 DIAGNOSIS — M6530 Trigger finger, unspecified finger: Secondary | ICD-10-CM | POA: Diagnosis not present

## 2012-08-26 ENCOUNTER — Other Ambulatory Visit: Payer: Self-pay | Admitting: *Deleted

## 2012-08-26 MED ORDER — VALSARTAN-HYDROCHLOROTHIAZIDE 320-25 MG PO TABS: ORAL_TABLET | ORAL | Status: DC

## 2012-08-27 DIAGNOSIS — F3131 Bipolar disorder, current episode depressed, mild: Secondary | ICD-10-CM | POA: Diagnosis not present

## 2012-09-03 ENCOUNTER — Other Ambulatory Visit: Payer: Self-pay | Admitting: Family Medicine

## 2012-09-10 ENCOUNTER — Other Ambulatory Visit: Payer: Self-pay | Admitting: Family Medicine

## 2012-09-12 DIAGNOSIS — F3175 Bipolar disorder, in partial remission, most recent episode depressed: Secondary | ICD-10-CM | POA: Diagnosis not present

## 2012-09-12 DIAGNOSIS — Z79899 Other long term (current) drug therapy: Secondary | ICD-10-CM | POA: Diagnosis not present

## 2012-09-16 ENCOUNTER — Other Ambulatory Visit: Payer: Self-pay | Admitting: Family Medicine

## 2012-09-17 DIAGNOSIS — F3131 Bipolar disorder, current episode depressed, mild: Secondary | ICD-10-CM | POA: Diagnosis not present

## 2012-09-18 ENCOUNTER — Telehealth: Payer: Self-pay | Admitting: Family Medicine

## 2012-09-18 NOTE — Telephone Encounter (Signed)
Patient left blood test results from another provider that needs Dr. Roby Lofts attention.  Envelope is on the front of the chart.

## 2012-09-19 ENCOUNTER — Encounter (HOSPITAL_BASED_OUTPATIENT_CLINIC_OR_DEPARTMENT_OTHER): Payer: Self-pay | Admitting: *Deleted

## 2012-09-19 DIAGNOSIS — M6530 Trigger finger, unspecified finger: Secondary | ICD-10-CM | POA: Diagnosis not present

## 2012-09-19 NOTE — Pre-Procedure Instructions (Signed)
Copy of recent lab results req. from Dr. Fletcher Anon office.

## 2012-09-20 ENCOUNTER — Other Ambulatory Visit: Payer: Self-pay | Admitting: Family Medicine

## 2012-09-20 ENCOUNTER — Other Ambulatory Visit: Payer: Self-pay

## 2012-09-20 DIAGNOSIS — D649 Anemia, unspecified: Secondary | ICD-10-CM

## 2012-09-20 LAB — IRON AND TIBC
%SAT: 4 % — ABNORMAL LOW (ref 20–55)
Iron: 15 ug/dL — ABNORMAL LOW (ref 42–145)
TIBC: 400 ug/dL (ref 250–470)
UIBC: 385 ug/dL (ref 125–400)

## 2012-09-20 NOTE — Telephone Encounter (Signed)
Tell pt unless Hg is at 11.5 or better she should put off surgery/gastric bypass. Start iron tablet daily with meal. (She will need GI work up.)

## 2012-09-20 NOTE — Telephone Encounter (Signed)
Notified pt unless Hg is at 11.5 or better she should put off surgery/gastric bypass. Start iron tablet daily with meal. (She will need GI work up.) Hemoccult cards at front desk ready for pick up. Patient verbalized understanding.

## 2012-09-20 NOTE — Pre-Procedure Instructions (Signed)
BMET not done with pt's recent labs, will do [employer].

## 2012-09-20 NOTE — Telephone Encounter (Signed)
Left message on voicemail to return call.

## 2012-09-20 NOTE — Telephone Encounter (Signed)
Notified patient these tests show significant anemia this needs to be looked into further. Recommend TIBC, ferritin, repeat hemoglobin/hematocrit. Also need to be referred back to gastroenterology. Blood papers ready for pick up and referral to gastroenterology/Dr. Kendell Bane initiated in the system. Patient wanted to inform you that she has her hand surgery scheduled on 09/24/12 and her gastric bypass surgery is scheduled for 10/21/12.

## 2012-09-20 NOTE — Telephone Encounter (Signed)
They may or may not do the surgery with this issue going on. She definetely needs this improved before gastric surgery.we will try to do urgent referral

## 2012-09-20 NOTE — Telephone Encounter (Signed)
These tests show significant anemia this needs to be looked into further. I recommend TIBC, ferritin, repeat hemoglobin/hematocrit. Also this patient is going to need to be referred back to gastroenterology. Confirm [facility] /Dr. Kendell Bane. We can help make a referral.

## 2012-09-21 LAB — CBC WITH DIFFERENTIAL/PLATELET
Basophils Absolute: 0 10*3/uL (ref 0.0–0.1)
Basophils Relative: 0 % (ref 0–1)
Eosinophils Absolute: 0.1 10*3/uL (ref 0.0–0.7)
Eosinophils Relative: 1 % (ref 0–5)
HCT: 31.5 % — ABNORMAL LOW (ref 36.0–46.0)
Hemoglobin: 9.4 g/dL — ABNORMAL LOW (ref 12.0–15.0)
Lymphocytes Relative: 19 % (ref 12–46)
Lymphs Abs: 1.9 10*3/uL (ref 0.7–4.0)
MCH: 19.4 pg — ABNORMAL LOW (ref 26.0–34.0)
MCHC: 29.8 g/dL — ABNORMAL LOW (ref 30.0–36.0)
MCV: 65.1 fL — ABNORMAL LOW (ref 78.0–100.0)
Monocytes Absolute: 0.9 10*3/uL (ref 0.1–1.0)
Monocytes Relative: 9 % (ref 3–12)
Neutro Abs: 7.1 10*3/uL (ref 1.7–7.7)
Neutrophils Relative %: 71 % (ref 43–77)
Platelets: 383 10*3/uL (ref 150–400)
RBC: 4.84 MIL/uL (ref 3.87–5.11)
RDW: 18.7 % — ABNORMAL HIGH (ref 11.5–15.5)
WBC: 10 10*3/uL (ref 4.0–10.5)

## 2012-09-21 LAB — FERRITIN: Ferritin: 4 ng/mL — ABNORMAL LOW (ref 10–291)

## 2012-09-23 ENCOUNTER — Encounter (HOSPITAL_BASED_OUTPATIENT_CLINIC_OR_DEPARTMENT_OTHER): Payer: Self-pay | Admitting: Orthopedic Surgery

## 2012-09-23 NOTE — H&P (Signed)
Carrie Cook is a 52 year old right hand dominant female with numbness and tingling both hands, right greater than left all fingers on a relatively constant basis on her right side. She has a history of carpal tunnel syndrome with positive nerve conductions done in 2000 by Dr. Huston Foley. She did not have surgery. She had injections done at that time and it has not resolved. She developed a trigger finger approximately 5 years ago left thumb. This resolved after injection. She has diabetes and arthritis. She has no history of thyroid problems or gout. She is awakened 7 out of 7 nights. She has no history of injury to the hands or neck. 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 She complains of intermittent severe, throbbing, aching and burning pain with a feeling of swelling numbness and weakness. She states it is gradually getting worse. Activity makes it worse. She has been taking ibuprofen and Tylenol. She has had a carpal tunnel release.  She has developed trigger finger on her right thumb.  This has been injected on two occasions. The carpal tunnel is doing well.  It continues to trigger.  She is going to have bariatric surgery the end of next month.  She would like to proceed.   PAST MEDICAL HISTORY: She is allergic to sulfa, Codeine, several BP medicines and Prozac. She is on the following medications: AcipHex, Amlodipine, Humalog, Lantus, Symbicort, Valsartan/HCTZ, Venlafaxine HCL, Ventolin, Claritin, aspirin and Pravastatin.  She has had a laparoscopy and tubal ligation.  FAMILY H ISTORY: Positive for diabetes, heart disease, high BP and arthritis.  SOCIAL HISTORY: She does not smoke or drink. She is married.  REVIEW OF SYSTEMS: Positive for double vision, high BP, asthma, pneumonia, shortness of breath, stomach ulcer, blood in her urine, rash, balance problems, headaches, depression, sleep disorder and anemia.  Carrie Cook is an 52 y.o. female.   Chief Complaint: STS RT thumb HPI: see above  Past Medical History  Diagnosis Date  . Hyperlipidemia     no current med.  . Obesity   . Gastroesophageal reflux disease   . Anxiety   . Depression   . H/O hiatal hernia   . PONV (postoperative nausea and vomiting)   . Migraines   . Asthma     daily and prn inhalers  . Anemia   . Shortness of breath     with daily activities  . Degenerative joint disease     right knee, spine  . Lock jaw     jaw locks open if opens mouth wide  . Bipolar disorder   . Sleep apnea     uses CPAP nightly  . History of endometriosis   . Insulin dependent diabetes mellitus   . Hypertension     under control with med., has been on med. x 2 yr.  . Palpitations   . Dental crowns present   . Family history of anesthesia complication     pt's mother and sister have hx. of post-op N/V  . Trigger thumb of right hand 08/2012    Past Surgical History  Procedure Laterality Date  . Tubal ligation  1993  . Pelvic laparoscopy  11/04/1999    with fulguration of endometriosis  . Colonoscopy  05/2009  . Carpal tunnel release  01/03/2012    Procedure: CARPAL TUNNEL RELEASE;  Surgeon: Nicki Reaper, MD;  Location: [facility];  Service: Orthopedics;  Laterality: Right;  . Breath tek h pylori N/A 08/13/2012    Procedure: BREATH TEK H PYLORI;  Surgeon: Mariella Saa, MD;  Location: [facility];  Service: General;  Laterality: N/A;  . Laparoscopy      due to endometriosis    Family History  Problem Relation Age of Onset  . Hypertension Father   . Hyperlipidemia Father   . COPD Mother   . Heart disease Mother   . Cancer Mother     Cervix  . Anesthesia problems Mother     post-op N/V  . Thyroid disease Sister   . Anesthesia problems Sister     post-op N/V  . Thyroid disease Paternal Uncle    Social History:  reports that she has never smoked. She has never used smokeless tobacco. She reports that she  does not drink alcohol or use illicit drugs.  Allergies:  Allergies  Allergen Reactions  . Codeine Nausea And Vomiting  . Prozac [Fluoxetine Hcl] Hives  . Dyazide [Hydrochlorothiazide W-Triamterene] Cough  . Erythromycin Nausea Only  . Lisinopril Cough  . Pravastatin Other (See Comments)    BODY ACHES, FLU-LIKE SX.  . Sulfonamide Derivatives Rash    No prescriptions prior to admission    No results found for this or any previous visit (from the past 48 hour(s)).  No results found.   Pertinent items are noted in HPI.  Height 5\' 3"  (1.6 m), weight 235 lb (106.595 kg).  General appearance: alert, cooperative and appears stated age Head: Normocephalic, without obvious abnormality Neck: no JVD Resp: clear to auscultation bilaterally Cardio: regular rate and rhythm, S1, S2 normal, no murmur, click, rub or gallop GI: soft, non-tender; bowel sounds normal; no masses,  no organomegaly Extremities: extremities normal, atraumatic, no cyanosis or edema Pulses: 2+ and symmetric Skin: Skin color, texture, turgor normal. No rashes or lesions Neurologic: Grossly normal Incision/Wound: na  Assessment/Plan We will go ahead and schedule her for release A-1 pulley right thumb as an outpatient under regional anesthesia.  She will be healed by the time her bariatric surgery is done.  Aadhav Uhlig R 09/23/2012, 8:21 PM

## 2012-09-24 ENCOUNTER — Encounter (HOSPITAL_BASED_OUTPATIENT_CLINIC_OR_DEPARTMENT_OTHER): Payer: Self-pay | Admitting: Certified Registered Nurse Anesthetist

## 2012-09-24 ENCOUNTER — Ambulatory Visit (HOSPITAL_BASED_OUTPATIENT_CLINIC_OR_DEPARTMENT_OTHER): Payer: Medicare Other | Admitting: Certified Registered Nurse Anesthetist

## 2012-09-24 ENCOUNTER — Ambulatory Visit (HOSPITAL_BASED_OUTPATIENT_CLINIC_OR_DEPARTMENT_OTHER)
Admission: RE | Admit: 2012-09-24 | Discharge: 2012-09-24 | Disposition: A | Discharge status: Home / Self Care | Payer: Medicare Other | Source: Ambulatory Visit | Attending: Orthopedic Surgery | Admitting: Orthopedic Surgery

## 2012-09-24 ENCOUNTER — Encounter (HOSPITAL_BASED_OUTPATIENT_CLINIC_OR_DEPARTMENT_OTHER)
Admission: RE | Disposition: A | Discharge status: Home / Self Care | Payer: Self-pay | Source: Ambulatory Visit | Attending: Orthopedic Surgery

## 2012-09-24 DIAGNOSIS — M129 Arthropathy, unspecified: Secondary | ICD-10-CM | POA: Diagnosis not present

## 2012-09-24 DIAGNOSIS — Z888 Allergy status to other drugs, medicaments and biological substances status: Secondary | ICD-10-CM | POA: Diagnosis not present

## 2012-09-24 DIAGNOSIS — M6530 Trigger finger, unspecified finger: Secondary | ICD-10-CM | POA: Diagnosis not present

## 2012-09-24 DIAGNOSIS — M1990 Unspecified osteoarthritis, unspecified site: Secondary | ICD-10-CM | POA: Diagnosis not present

## 2012-09-24 DIAGNOSIS — G43909 Migraine, unspecified, not intractable, without status migrainosus: Secondary | ICD-10-CM | POA: Insufficient documentation

## 2012-09-24 DIAGNOSIS — IMO0002 Reserved for concepts with insufficient information to code with codable children: Secondary | ICD-10-CM | POA: Insufficient documentation

## 2012-09-24 DIAGNOSIS — Z6841 Body Mass Index (BMI) 40.0 and over, adult: Secondary | ICD-10-CM | POA: Insufficient documentation

## 2012-09-24 DIAGNOSIS — Z7982 Long term (current) use of aspirin: Secondary | ICD-10-CM | POA: Diagnosis not present

## 2012-09-24 DIAGNOSIS — E785 Hyperlipidemia, unspecified: Secondary | ICD-10-CM | POA: Diagnosis not present

## 2012-09-24 DIAGNOSIS — N289 Disorder of kidney and ureter, unspecified: Secondary | ICD-10-CM | POA: Diagnosis not present

## 2012-09-24 DIAGNOSIS — D649 Anemia, unspecified: Secondary | ICD-10-CM | POA: Diagnosis not present

## 2012-09-24 DIAGNOSIS — E119 Type 2 diabetes mellitus without complications: Secondary | ICD-10-CM | POA: Insufficient documentation

## 2012-09-24 DIAGNOSIS — G709 Myoneural disorder, unspecified: Secondary | ICD-10-CM | POA: Diagnosis not present

## 2012-09-24 DIAGNOSIS — Z794 Long term (current) use of insulin: Secondary | ICD-10-CM | POA: Insufficient documentation

## 2012-09-24 DIAGNOSIS — Z79899 Other long term (current) drug therapy: Secondary | ICD-10-CM | POA: Diagnosis not present

## 2012-09-24 DIAGNOSIS — K219 Gastro-esophageal reflux disease without esophagitis: Secondary | ICD-10-CM | POA: Insufficient documentation

## 2012-09-24 DIAGNOSIS — Z885 Allergy status to narcotic agent status: Secondary | ICD-10-CM | POA: Diagnosis not present

## 2012-09-24 DIAGNOSIS — I10 Essential (primary) hypertension: Secondary | ICD-10-CM | POA: Insufficient documentation

## 2012-09-24 DIAGNOSIS — J45909 Unspecified asthma, uncomplicated: Secondary | ICD-10-CM | POA: Insufficient documentation

## 2012-09-24 DIAGNOSIS — F3130 Bipolar disorder, current episode depressed, mild or moderate severity, unspecified: Secondary | ICD-10-CM | POA: Insufficient documentation

## 2012-09-24 DIAGNOSIS — F411 Generalized anxiety disorder: Secondary | ICD-10-CM | POA: Insufficient documentation

## 2012-09-24 DIAGNOSIS — K449 Diaphragmatic hernia without obstruction or gangrene: Secondary | ICD-10-CM | POA: Diagnosis not present

## 2012-09-24 DIAGNOSIS — G4730 Sleep apnea, unspecified: Secondary | ICD-10-CM | POA: Diagnosis not present

## 2012-09-24 DIAGNOSIS — Z882 Allergy status to sulfonamides status: Secondary | ICD-10-CM | POA: Diagnosis not present

## 2012-09-24 HISTORY — DX: Family history of other specified conditions: Z84.89

## 2012-09-24 HISTORY — DX: Long term (current) use of insulin: Z79.4

## 2012-09-24 HISTORY — DX: Palpitations: R00.2

## 2012-09-24 HISTORY — DX: Migraine, unspecified, not intractable, without status migrainosus: G43.909

## 2012-09-24 HISTORY — DX: Reserved for inherently not codable concepts without codable children: IMO0001

## 2012-09-24 HISTORY — DX: Anemia, unspecified: D64.9

## 2012-09-24 HISTORY — DX: Dental restoration status: Z98.811

## 2012-09-24 HISTORY — PX: TRIGGER FINGER RELEASE: SHX641

## 2012-09-24 HISTORY — DX: Other tetanus: A35

## 2012-09-24 HISTORY — DX: Type 2 diabetes mellitus without complications: E11.9

## 2012-09-24 HISTORY — DX: Personal history of other diseases of the female genital tract: Z87.42

## 2012-09-24 LAB — POCT I-STAT, CHEM 8
BUN: 14 mg/dL (ref 6–23)
Calcium, Ion: 1.11 mmol/L — ABNORMAL LOW (ref 1.12–1.23)
Chloride: 107 mEq/L (ref 96–112)
Creatinine, Ser: 0.9 mg/dL (ref 0.50–1.10)
Glucose, Bld: 145 mg/dL — ABNORMAL HIGH (ref 70–99)
HCT: 36 % (ref 36.0–46.0)
Hemoglobin: 12.2 g/dL (ref 12.0–15.0)
Potassium: 3.5 mEq/L (ref 3.5–5.1)
Sodium: 140 mEq/L (ref 135–145)
TCO2: 20 mmol/L (ref 0–100)

## 2012-09-24 LAB — GLUCOSE, CAPILLARY: Glucose-Capillary: 114 mg/dL — ABNORMAL HIGH (ref 70–99)

## 2012-09-24 SURGERY — RELEASE, A1 PULLEY, FOR TRIGGER FINGER
Anesthesia: Monitor Anesthesia Care | Laterality: Right | Wound class: Clean

## 2012-09-24 MED ORDER — LACTATED RINGERS IV SOLN
INTRAVENOUS | Status: DC
  Administered 2012-09-24: 08:00:00 via INTRAVENOUS

## 2012-09-24 MED ORDER — HYDROMORPHONE HCL PF 1 MG/ML IJ SOLN: 0.2500 mg | INTRAMUSCULAR | Status: DC | PRN

## 2012-09-24 MED ORDER — MIDAZOLAM HCL 2 MG/ML PO SYRP: 12.0000 mg | ORAL_SOLUTION | Freq: Once | ORAL | Status: DC | PRN

## 2012-09-24 MED ORDER — CHLORHEXIDINE GLUCONATE 4 % EX LIQD: 60.0000 mL | Freq: Once | CUTANEOUS | Status: DC

## 2012-09-24 MED ORDER — CEFAZOLIN SODIUM-DEXTROSE 2-3 GM-% IV SOLR
INTRAVENOUS | Status: DC | PRN
  Administered 2012-09-24: 2 g via INTRAVENOUS

## 2012-09-24 MED ORDER — LIDOCAINE HCL (CARDIAC) 20 MG/ML IV SOLN
INTRAVENOUS | Status: DC | PRN
  Administered 2012-09-24: 30 mg via INTRAVENOUS

## 2012-09-24 MED ORDER — BUPIVACAINE HCL (PF) 0.25 % IJ SOLN
INTRAMUSCULAR | Status: DC | PRN
  Administered 2012-09-24: 4 mL

## 2012-09-24 MED ORDER — METOPROLOL TARTRATE 25 MG PO TABS
25.0000 mg | ORAL_TABLET | Freq: Once | ORAL | Status: AC
  Administered 2012-09-24: 25 mg via ORAL

## 2012-09-24 MED ORDER — FENTANYL CITRATE 0.05 MG/ML IJ SOLN: 50.0000 ug | INTRAMUSCULAR | Status: DC | PRN

## 2012-09-24 MED ORDER — MIDAZOLAM HCL 2 MG/2ML IJ SOLN: 1.0000 mg | INTRAMUSCULAR | Status: DC | PRN

## 2012-09-24 MED ORDER — PROPOFOL INFUSION 10 MG/ML OPTIME
INTRAVENOUS | Status: DC | PRN
  Administered 2012-09-24: 100 ug/kg/min via INTRAVENOUS

## 2012-09-24 MED ORDER — FENTANYL CITRATE 0.05 MG/ML IJ SOLN
INTRAMUSCULAR | Status: DC | PRN
  Administered 2012-09-24: 50 ug via INTRAVENOUS

## 2012-09-24 MED ORDER — LIDOCAINE HCL (PF) 0.5 % IJ SOLN
INTRAMUSCULAR | Status: DC | PRN
  Administered 2012-09-24: 30 mL via INTRAVENOUS

## 2012-09-24 MED ORDER — ONDANSETRON HCL 4 MG/2ML IJ SOLN
INTRAMUSCULAR | Status: DC | PRN
  Administered 2012-09-24: 4 mg via INTRAVENOUS

## 2012-09-24 SURGICAL SUPPLY — 33 items
BANDAGE COBAN STERILE 2 (GAUZE/BANDAGES/DRESSINGS) ×2 IMPLANT
BLADE SURG 15 STRL LF DISP TIS (BLADE) ×1 IMPLANT
BLADE SURG 15 STRL SS (BLADE) ×2
BNDG CMPR 9X4 STRL LF SNTH (GAUZE/BANDAGES/DRESSINGS)
BNDG ESMARK 4X9 LF (GAUZE/BANDAGES/DRESSINGS) IMPLANT
CHLORAPREP W/TINT 26ML (MISCELLANEOUS) ×2 IMPLANT
CLOTH BEACON ORANGE TIMEOUT ST (SAFETY) ×2 IMPLANT
CORDS BIPOLAR (ELECTRODE) IMPLANT
COVER MAYO STAND STRL (DRAPES) ×2 IMPLANT
COVER TABLE BACK 60X90 (DRAPES) ×2 IMPLANT
CUFF TOURNIQUET SINGLE 18IN (TOURNIQUET CUFF) ×1 IMPLANT
DECANTER SPIKE VIAL GLASS SM (MISCELLANEOUS) IMPLANT
DRAPE EXTREMITY T 121X128X90 (DRAPE) ×2 IMPLANT
DRAPE SURG 17X23 STRL (DRAPES) ×2 IMPLANT
GAUZE XEROFORM 1X8 LF (GAUZE/BANDAGES/DRESSINGS) ×2 IMPLANT
GLOVE BIO SURGEON STRL SZ 6.5 (GLOVE) ×2 IMPLANT
GLOVE BIOGEL PI IND STRL 8.5 (GLOVE) ×1 IMPLANT
GLOVE BIOGEL PI INDICATOR 8.5 (GLOVE) ×1
GLOVE SURG ORTHO 8.0 STRL STRW (GLOVE) ×2 IMPLANT
GOWN BRE IMP PREV XXLGXLNG (GOWN DISPOSABLE) ×2 IMPLANT
GOWN PREVENTION PLUS XLARGE (GOWN DISPOSABLE) ×2 IMPLANT
NEEDLE 27GAX1X1/2 (NEEDLE) ×2 IMPLANT
NS IRRIG 1000ML POUR BTL (IV SOLUTION) ×2 IMPLANT
PACK BASIN DAY SURGERY FS (CUSTOM PROCEDURE TRAY) ×2 IMPLANT
PADDING CAST ABS 4INX4YD NS (CAST SUPPLIES) ×1
PADDING CAST ABS COTTON 4X4 ST (CAST SUPPLIES) ×1 IMPLANT
SPONGE GAUZE 4X4 12PLY (GAUZE/BANDAGES/DRESSINGS) ×3 IMPLANT
STOCKINETTE 4X48 STRL (DRAPES) ×2 IMPLANT
SUT VICRYL RAPIDE 4/0 PS 2 (SUTURE) ×2 IMPLANT
SYR BULB 3OZ (MISCELLANEOUS) ×2 IMPLANT
SYR CONTROL 10ML LL (SYRINGE) ×2 IMPLANT
TOWEL OR 17X24 6PK STRL BLUE (TOWEL DISPOSABLE) ×4 IMPLANT
UNDERPAD 30X30 INCONTINENT (UNDERPADS AND DIAPERS) ×2 IMPLANT

## 2012-09-24 NOTE — Op Note (Signed)
[name], Carrie Cook              ACCOUNT NO.:  [id number]  MEDICAL RECORD NO.:  [id number]  LOCATION:                                 FACILITY:  PHYSICIAN:  Cindee Salt, M.D.       DATE OF BIRTH:  Nov 12, 1960  DATE OF PROCEDURE:  09/24/2012 DATE OF DISCHARGE:                              OPERATIVE REPORT   PREOPERATIVE DIAGNOSIS:  Stenosing tenosynovitis, right thumb.  POSTOPERATIVE DIAGNOSIS:  Stenosing tenosynovitis, right thumb.  OPERATION:  Release of A1 pulley, right thumb.  SURGEON:  Cindee Salt, MD  ASSISTANT:  Betha Loa, MD  ANESTHESIA:  Forearm-based IV regional with local infiltration of.  ANESTHESIOLOGIST:  W. Autumn Patty, MD  HISTORY:  The patient is a 52 year old female with a history of carpal tunnel release, triggering of her right thumb post carpal tunnel release.  This has not responded to injections.  She is admitted now for release.  Pre, peri, and postoperative course have been discussed along with risks and complications.  She is aware that there is no guarantee with surgery; possibility of infection; recurrence of injury to arteries, nerves, tendons; incomplete relief of symptoms; dystrophy.  In the preoperative area, the patient was seen and the extremity was marked by both the patient and surgeon.  Antibiotic given.  DESCRIPTION OF PROCEDURE:  The patient was brought to the operating room where a forearm-based IV regional anesthetic was carried out without difficulty.  She was prepped using ChloraPrep, supine position with the right arm free.  A 3-minute dry time was allowed.  Time-out taken, confirming the patient and procedure.  A transverse incision was made over the A1 pulley of the right thumb.  She had some sensation.  A block was then given with 0.25% Marcaine without epinephrine to the site, 4 mL was used.  The transverse incision was deepened.  Neurovascular structure was identified.  The A1 pulley was identified.  After retractors  placed, protecting neurovascular bundles.  The A1 pulley was released on its radial aspect.  The oblique pulley was left intact. Partial synovectomy was performed proximally with blunt dissection.  The wound was copiously irrigated with saline.  Thumb was placed through a full range motion, no further triggering was noted.  The wound was closed with interrupted 4-0 Vicryl Rapide sutures.  A sterile compressive dressing with fingers thumb free was applied.  On deflation of the tourniquet, all fingers immediately pinked.  She was taken to the recovery room for observation in satisfactory condition. She will be discharged home to return to the [facility] [place] in 1 week on [place].          ______________________________ Cindee Salt, M.D.     GK/MEDQ  D:  09/24/2012  T:  09/24/2012  Job:  [id number]

## 2012-09-24 NOTE — Anesthesia Preprocedure Evaluation (Addendum)
Anesthesia Evaluation  Patient identified by MRN, date of birth, ID band Patient awake    Reviewed: Allergy & Precautions, H&P , NPO status , Patient's Chart, lab work & pertinent test results, reviewed documented beta blocker date and time   History of Anesthesia Complications (+) PONV and Family history of anesthesia reaction  Airway Mallampati: II TM Distance: >3 FB Neck ROM: Full    Dental no notable dental hx. (+) Teeth Intact and Dental Advisory Given   Pulmonary shortness of breath, asthma , sleep apnea and Continuous Positive Airway Pressure Ventilation ,  breath sounds clear to auscultation  Pulmonary exam normal       Cardiovascular hypertension, Pt. on home beta blockers and On Medications Rhythm:Regular Rate:Normal     Neuro/Psych  Headaches, PSYCHIATRIC DISORDERS Anxiety Depression  Neuromuscular disease    GI/Hepatic negative GI ROS, Neg liver ROS, hiatal hernia, GERD-  Medicated,  Endo/Other  diabetes, Type 2, Insulin DependentMorbid obesity  Renal/GU Renal disease  negative genitourinary   Musculoskeletal   Abdominal   Peds  Hematology negative hematology ROS (+)   Anesthesia Other Findings   Reproductive/Obstetrics negative OB ROS                          Anesthesia Physical Anesthesia Plan  ASA: III  Anesthesia Plan: MAC and Bier Block   Post-op Pain Management:    Induction: Intravenous  Airway Management Planned: Simple Face Mask  Additional Equipment:   Intra-op Plan:   Post-operative Plan:   Informed Consent: I have reviewed the patients History and Physical, chart, labs and discussed the procedure including the risks, benefits and alternatives for the proposed anesthesia with the patient or authorized representative who has indicated his/her understanding and acceptance.   Dental advisory given  Plan Discussed with: CRNA  Anesthesia Plan Comments:          Anesthesia Quick Evaluation

## 2012-09-24 NOTE — Anesthesia Procedure Notes (Addendum)
Anesthesia Regional Block:  Bier block (IV Regional)  Pre-Anesthetic Checklist: ,, timeout performed, Correct Patient, Correct Site, Correct Laterality, Correct Procedure,, site marked, surgical consent,, at surgeon's request Needles:  Injection technique: Single-shot  Needle Type: Other      Needle Gauge: 20 and 20 G    Additional Needles: Bier block (IV Regional) Narrative:   Performed by: Personally   Bier block (IV Regional) Procedure Name: MAC Date/Time: 09/24/2012 8:30 AM Performed by: Orma Cheetham D Pre-anesthesia Checklist: Patient identified, Emergency Drugs available, Suction available, Patient being monitored and Timeout performed Patient Re-evaluated:Patient Re-evaluated prior to inductionOxygen Delivery Method: Simple face mask

## 2012-09-24 NOTE — Transfer of Care (Signed)
Immediate Anesthesia Transfer of Care Note  Patient: Carrie Cook  Procedure(s) Performed: Procedure(s): RELEASE A-1 PULLEY OF RIGHT THUMB (Right)  Patient Location: PACU  Anesthesia Type:Bier block  Level of Consciousness: awake, alert , oriented and patient cooperative  Airway & Oxygen Therapy: Patient Spontanous Breathing and Patient connected to face mask oxygen  Post-op Assessment: Report given to PACU RN and Post -op Vital signs reviewed and stable  Post vital signs: Reviewed and stable  Complications: No apparent anesthesia complications

## 2012-09-24 NOTE — Brief Op Note (Signed)
09/24/2012  8:59 AM  PATIENT:  Barbette Merino  52 y.o. female  PRE-OPERATIVE DIAGNOSIS:  RIGHT TRIGGER THUMB  POST-OPERATIVE DIAGNOSIS:  RIGHT TRIGGER THUMB  PROCEDURE:  Procedure(s): RELEASE A-1 PULLEY OF RIGHT THUMB (Right)  SURGEON:  Surgeon(s) and Role:    * Nicki Reaper, MD - Primary    * Tami Ribas, MD - Assisting  PHYSICIAN ASSISTANT:   ASSISTANTS: K Gentry Seeber,MdD  ANESTHESIA:   local and regional  EBL:  Total I/O In: 500 [I.V.:500] Out: -   BLOOD ADMINISTERED:none  DRAINS: none   LOCAL MEDICATIONS USED:  MARCAINE     SPECIMEN:  No Specimen  DISPOSITION OF SPECIMEN:  N/A  COUNTS:  YES  TOURNIQUET:   Total Tourniquet Time Documented: Forearm (Right) - 18 minutes Total: Forearm (Right) - 18 minutes   DICTATION: .Other Dictation: Dictation Number [id number]  PLAN OF CARE: Discharge to home after PACU  PATIENT DISPOSITION:  PACU - hemodynamically stable.

## 2012-09-24 NOTE — Anesthesia Postprocedure Evaluation (Signed)
  Anesthesia Post-op Note  Patient: Carrie Cook  Procedure(s) Performed: Procedure(s): RELEASE A-1 PULLEY OF RIGHT THUMB (Right)  Patient Location: PACU  Anesthesia Type:MAC and Bier block  Level of Consciousness: awake, alert  and oriented  Airway and Oxygen Therapy: Patient Spontanous Breathing  Post-op Pain: none  Post-op Assessment: Post-op Vital signs reviewed, Patient's Cardiovascular Status Stable, Respiratory Function Stable, Patent Airway and No signs of Nausea or vomiting  Post-op Vital Signs: Reviewed and stable  Complications: No apparent anesthesia complications

## 2012-09-25 ENCOUNTER — Other Ambulatory Visit: Payer: Self-pay | Admitting: *Deleted

## 2012-09-25 MED ORDER — METOPROLOL TARTRATE 25 MG PO TABS: 25.0000 mg | ORAL_TABLET | Freq: Two times a day (BID) | ORAL | Status: DC

## 2012-09-26 ENCOUNTER — Encounter: Payer: Medicare Other | Attending: General Surgery | Admitting: *Deleted

## 2012-09-26 ENCOUNTER — Encounter (HOSPITAL_BASED_OUTPATIENT_CLINIC_OR_DEPARTMENT_OTHER): Payer: Self-pay | Admitting: Orthopedic Surgery

## 2012-09-26 DIAGNOSIS — Z713 Dietary counseling and surveillance: Secondary | ICD-10-CM | POA: Diagnosis not present

## 2012-09-26 LAB — POCT HEMOGLOBIN-HEMACUE: Hemoglobin: 10.8 g/dL — ABNORMAL LOW (ref 12.0–15.0)

## 2012-09-29 NOTE — Patient Instructions (Signed)
Follow:  Pre-Op Diet per MD 2 weeks prior to surgery  Phase 2- Liquids (clear/full) 2 weeks after surgery  Vitamin/Mineral/Calcium guidelines for purchasing bariatric supplements  Exercise guidelines pre and post-op per MD  Follow-up at [facility] in 2 weeks post-op for diet advancement. Contact Jaquia Benedicto at [email or web address] or [phone] as needed with questions/concerns.   

## 2012-09-29 NOTE — Progress Notes (Signed)
  Pre-Operative Nutrition Class:  Appt start time: 1730   End time:  1830.  Patient was seen on 09/26/12 for Pre-Operative Bariatric Surgery Education at [facility].   Surgery date: 10/21/12 Surgery type: RYGB Start weight at [facility]: 233.0 lbs (08/12/12) Weight today: 240.0 lbs  TANITA  BODY COMP RESULTS  09/26/12   BMI (kg/m^2) 42.5   Fat Mass (lbs) 111.5   Fat Free Mass (lbs) 128.5   Total Body Water (lbs) 94.0   Samples given per MNT protocol. Patient educated on appropriate usage: Bariatric Advantage Multivitamin Lot # [id number]; Exp: 12/15  Bariatric Advantage Sublingual B12 Lot # [id number]; Exp: 10/15  Celebrate Vitamins Multivitamin Lot # [id number]; Exp: 01/15  Celebrate Vitamins Calcium Citrate Lot # [id number]; Exp: 01/16  BariActiv Multivitamin Lot # [id number]; Exp: 05/16  BariActiv Calcium Citrate Lot # [id number]; Exp: 05/16  BariActiv Iron + Vit C Lot # [id number]; Exp: 05/16  Unjury Protein Powder Lot # [id number]; Exp: 09/15  The following the learning objectives were met by the patient during this course:  Identify Pre-Op Dietary Goals and will begin 2 weeks pre-operatively  Identify appropriate sources of fluids and proteins   State protein recommendations and appropriate sources pre and post-operatively  Identify Post-Operative Dietary Goals and will follow for 2 weeks post-operatively  Identify appropriate multivitamin and calcium sources  Describe the need for physical activity post-operatively and will follow MD recommendations  State when to call healthcare provider regarding medication questions or post-operative complications  Handouts given during class include:  Pre-Op Bariatric Surgery Diet Handout  Protein Shake Handout  Post-Op Bariatric Surgery Nutrition Handout  BELT Program Information Flyer  Support Group Information Flyer  [facility] Bariatric Supplements Price List  Follow-Up Plan: Patient will  follow-up at [facility] 2 weeks post operatively for diet advancement per MD.

## 2012-09-30 ENCOUNTER — Encounter: Payer: Self-pay | Admitting: Internal Medicine

## 2012-10-01 ENCOUNTER — Other Ambulatory Visit: Payer: Self-pay | Admitting: Family Medicine

## 2012-10-07 ENCOUNTER — Other Ambulatory Visit (INDEPENDENT_AMBULATORY_CARE_PROVIDER_SITE_OTHER): Payer: Medicare Other | Admitting: *Deleted

## 2012-10-07 DIAGNOSIS — Z0000 Encounter for general adult medical examination without abnormal findings: Secondary | ICD-10-CM

## 2012-10-07 LAB — POC HEMOCCULT BLD/STL (HOME/3-CARD/SCREEN)
Card #2 Fecal Occult Blod, POC: NEGATIVE
Card #3 Fecal Occult Blood, POC: NEGATIVE
Fecal Occult Blood, POC: NEGATIVE

## 2012-10-07 NOTE — Progress Notes (Signed)
Dr. Johna Sheriff,   Please enter preop orders in Epic for Carrie Cook - she is coming to [facility] on 9/22 for her preop appt / labs.   Thanks

## 2012-10-08 ENCOUNTER — Other Ambulatory Visit (INDEPENDENT_AMBULATORY_CARE_PROVIDER_SITE_OTHER): Payer: Self-pay | Admitting: General Surgery

## 2012-10-09 ENCOUNTER — Ambulatory Visit (INDEPENDENT_AMBULATORY_CARE_PROVIDER_SITE_OTHER): Payer: Medicare Other | Admitting: Family Medicine

## 2012-10-09 ENCOUNTER — Encounter: Payer: Self-pay | Admitting: Family Medicine

## 2012-10-09 VITALS — BP 118/70 | Ht 63.0 in | Wt 240.0 lb

## 2012-10-09 DIAGNOSIS — E119 Type 2 diabetes mellitus without complications: Secondary | ICD-10-CM | POA: Diagnosis not present

## 2012-10-09 DIAGNOSIS — D649 Anemia, unspecified: Secondary | ICD-10-CM

## 2012-10-09 DIAGNOSIS — E1165 Type 2 diabetes mellitus with hyperglycemia: Secondary | ICD-10-CM | POA: Diagnosis not present

## 2012-10-09 DIAGNOSIS — E669 Obesity, unspecified: Secondary | ICD-10-CM

## 2012-10-09 DIAGNOSIS — D509 Iron deficiency anemia, unspecified: Secondary | ICD-10-CM

## 2012-10-09 DIAGNOSIS — I10 Essential (primary) hypertension: Secondary | ICD-10-CM

## 2012-10-09 DIAGNOSIS — E785 Hyperlipidemia, unspecified: Secondary | ICD-10-CM

## 2012-10-09 LAB — POCT GLYCOSYLATED HEMOGLOBIN (HGB A1C): Hemoglobin A1C: 6.7

## 2012-10-09 LAB — POCT HEMOGLOBIN: Hemoglobin: 9.5 g/dL — AB (ref 12.2–16.2)

## 2012-10-09 NOTE — Progress Notes (Signed)
  Subjective:    Patient ID: Barbette Merino, female    DOB: [birth year], 52 y.o.   MRN: [id number]  HPIHere for a diabetes check up. A1C 6.7 today.  The patient was seen today as part of a comprehensive diabetic check up. The patient had the following elements completed: -Review of medication compliance -Review of glucose monitoring results -Review of any complications do to high or low sugars -Diabetic foot exam was completed as part of today's visit. The following was also discussed: -Importance of yearly eye exams -Importance of following diabetic/low sugar-starch diet -Importance of exercise and regular activity -Importance of regular followup visits. -Most recent hemoglobin A1c were reviewed with the patient along with goals regarding diabetes.   Iron was low and she is having gastic bipass surgery on sept 29. Patient is concerned that her hemoglobin is low enough to where they won't do surgery. She has iron deficient anemia she had a colonoscopy in 2011 she has had some intermittent epigastric pain over the past several months she denies any black stools Hemoccult cards were negative blood testing for iron levels and TIBC were abnormal. Family history negative for any type of cancers   Review of Systems See above    Objective:   Physical Exam Lungs are clear hearts regular abdomen soft no guarding rebound significant obesity extremities no edema skin dry pulses are normal diabetic foot exam normal       Assessment & Plan:  #1 diabetes control very good continue current measures and actually I believe the patient would do even better after having gastric bypass surgery #2 morbid obesity-gastric bypass surgery indicated hopefully she can get it somewhere within the next month #3 iron deficient anemia I will speak with her gastroenterologist,Dr Kendell Bane, to see if they can put her on a fast track for doing upper endoscopy. Await discussion with him. She will followup in 3  months.

## 2012-10-10 ENCOUNTER — Telehealth: Payer: Self-pay | Admitting: Family Medicine

## 2012-10-10 NOTE — Telephone Encounter (Signed)
Patient wanted Dr. Lorin Picket to know they moved her Laurette Schimke. Bypass surgery to the last week in October

## 2012-10-14 ENCOUNTER — Other Ambulatory Visit (HOSPITAL_COMMUNITY): Payer: Medicare Other

## 2012-10-14 ENCOUNTER — Telehealth: Payer: Self-pay | Admitting: Internal Medicine

## 2012-10-14 NOTE — Telephone Encounter (Signed)
Patient needs expedited office visit for iron deficiency anemia-upcoming surgery. Please arrange for her to be seen in the office this week with extender      Can I use an Urgent spot with one of the extenders per RMR request? Pt is already scheduled an OV for 10/6 at 0930 with [facility]. Should I cancel this? Please advise.

## 2012-10-14 NOTE — Telephone Encounter (Signed)
Pt is aware of OV with [facility] on 9/24 at 0930 and the other OV in Oct was cancelled

## 2012-10-16 ENCOUNTER — Other Ambulatory Visit: Payer: Self-pay | Admitting: Internal Medicine

## 2012-10-16 ENCOUNTER — Ambulatory Visit (INDEPENDENT_AMBULATORY_CARE_PROVIDER_SITE_OTHER): Payer: Medicare Other | Admitting: Gastroenterology

## 2012-10-16 ENCOUNTER — Encounter: Payer: Self-pay | Admitting: Gastroenterology

## 2012-10-16 VITALS — BP 153/95 | HR 103 | Temp 97.8°F | Ht 63.0 in | Wt 245.8 lb

## 2012-10-16 DIAGNOSIS — K219 Gastro-esophageal reflux disease without esophagitis: Secondary | ICD-10-CM

## 2012-10-16 DIAGNOSIS — D509 Iron deficiency anemia, unspecified: Secondary | ICD-10-CM

## 2012-10-16 MED ORDER — PEG-KCL-NACL-NASULF-NA ASC-C 100 G PO SOLR: 1.0000 | ORAL | Status: DC

## 2012-10-16 NOTE — Patient Instructions (Addendum)
1. Colonoscopy with upper endoscopy and possible small bowel capsule endoscopy with Dr. Jena Gauss as scheduled.

## 2012-10-16 NOTE — Progress Notes (Signed)
CC'd to PCP 

## 2012-10-16 NOTE — Assessment & Plan Note (Addendum)
52 year old lady with iron deficiency anemia, Hemoccult-positive stool. Previously evaluated for the same 3 years ago but at that time it was felt that her heavy menses were likely contributing to her iron deficiency. She had an unremarkable colonoscopy and upper endoscopy. Celiac panel was negative. Currently she complains of refractory heartburn on omeprazole. She is supposed to be undergoing gastric bypass surgery in the near future and is presenting for workup of her iron deficiency anemia prior to this.  I discussed this case with Dr. Jena Gauss today. Plan as outlined below.  1. Colonoscopy followed by upper endoscopy. If upper endoscopy is unremarkable, he will place a small bowel Givens capsule the day of procedures.  I have discussed the risks, alternatives, benefits with regards to but not limited to the risk of reaction to medication, bleeding, infection, perforation and the patient is agreeable to proceed. Written consent to be obtained. She will receive Phenergan 25mg  IV 30 minutes before the procedure to augment conscious sedation.

## 2012-10-16 NOTE — Progress Notes (Signed)
Primary Care Physician:  Lilyan Punt, MD  Primary Gastroenterologist:  Roetta Sessions, MD   Chief Complaint  Patient presents with  . Colonoscopy  . EGD    HPI:  Carrie Cook is a 52 y.o. female here for further evaluation of iron deficiency anemia. Gastric bypass pending in the near future. Recently was advised that she needed further evaluation of her iron deficiency by her PCP. We saw the size patient back in 2011 for iron deficiency anemia in the setting of heavy menses at that time. She underwent a colonoscopy with terminal ileoscopy. She had hemorrhoids. She also had an upper endoscopy that showed gastric polyps which were benign, gastritis without H. pylori. Celiac serologies were negative.   Patient states she has been intermittently taking iron since that time but had not been on this year up until about 3 weeks ago. Remotely used to give blood that it has been greater than 5 years. No menses in over one year on Provera. Complains of bad heartburn and regurgitation. No dysphagia. No vomiting. No melena, brbpr. No constipation/diarrhea. No abdominal pain. Could not afford Aciphex. So on omeprazole.  Denies hematuria, nosebleeds.  Heme negative X 3.   Current Outpatient Prescriptions  Medication Sig Dispense Refill  . albuterol (PROVENTIL HFA;VENTOLIN HFA) 108 (90 BASE) MCG/ACT inhaler Inhale 2 puffs into the lungs every 6 (six) hours as needed.        Marland Kitchen amLODipine (NORVASC) 5 MG tablet TAKE ONE TABLET BY MOUTH EVERY DAY  90 tablet  2  . budesonide-formoterol (SYMBICORT) 160-4.5 MCG/ACT inhaler Inhale 2 puffs into the lungs 2 (two) times daily as needed.       . divalproex (DEPAKOTE) 500 MG DR tablet Take 1,500 mg by mouth every other day. AND 2000 MG. EVERY OTHER DAY      . estradiol (ESTRACE) 2 MG tablet Take 1 tablet (2 mg total) by mouth daily.  30 tablet  11  . Ferrous Sulfate (IRON) 325 (65 FE) MG TABS Take by mouth daily.      . insulin lispro (HUMALOG PEN) 100 UNIT/ML  injection Inject into the skin daily. 10-20 units 2 to 3 times daily prn      . INVOKANA 100 MG TABS TAKE ONE TABLET BY MOUTH EVERY DAY  30 tablet  2  . lamoTRIgine (LAMICTAL) 25 MG tablet Take 100 mg by mouth daily.       Marland Kitchen loratadine (CLARITIN) 10 MG tablet Take 10 mg by mouth daily.      . medroxyPROGESTERone (PROVERA) 5 MG tablet Take 1 tablet (5 mg total) by mouth daily.  30 tablet  11  . metoprolol tartrate (LOPRESSOR) 25 MG tablet Take 1 tablet (25 mg total) by mouth 2 (two) times daily.  60 tablet  3  . Multiple Vitamin (MULTIVITAMIN) tablet Take 1 tablet by mouth daily. With iron      . NOVOLIN 70/30 RELION (70-30) 100 UNIT/ML injection INJECT 50 UNITS EVERY DAY IN THE MORNING AND 40 UNITS EVERY DAY IN THE EVENING  30 mL  0  . omeprazole (PRILOSEC) 20 MG capsule Take 20 mg by mouth daily.      Marland Kitchen RELION SHORT PEN NEEDLES 31G X 8 MM MISC USE AS DIRECTED  100 each  0  . risperiDONE (RISPERDAL) 0.5 MG tablet 0.5 mg.       . valsartan-hydrochlorothiazide (DIOVAN-HCT) 320-25 MG per tablet TAKE ONE TABLET BY MOUTH EVERY DAY  90 tablet  0  . vitamin C (ASCORBIC ACID)  500 MG tablet Take 500 mg by mouth daily.      . zaleplon (SONATA) 10 MG capsule Take 10 mg by mouth at bedtime.        No current facility-administered medications for this visit.    Allergies as of 10/16/2012 - Review Complete 10/16/2012  Allergen Reaction Noted  . Codeine Nausea And Vomiting   . Prozac [fluoxetine hcl] Hives 05/11/2009  . Dyazide [hydrochlorothiazide w-triamterene] Cough 05/11/2009  . Erythromycin Nausea Only 01/19/2011  . Lisinopril Cough 05/11/2009  . Pravastatin Other (See Comments) 06/21/2012  . Sulfonamide derivatives Rash     Past Medical History  Diagnosis Date  . Hyperlipidemia     no current med.  . Obesity   . Gastroesophageal reflux disease   . Anxiety   . Depression   . H/O hiatal hernia   . PONV (postoperative nausea and vomiting)   . Migraines   . Asthma     daily and prn  inhalers  . Anemia   . Shortness of breath     with daily activities  . Degenerative joint disease     right knee, spine  . Lock jaw     jaw locks open if opens mouth wide  . Bipolar disorder   . Sleep apnea     uses CPAP nightly  . History of endometriosis   . Insulin dependent diabetes mellitus   . Hypertension     under control with med., has been on med. x 2 yr.  . Palpitations   . Dental crowns present   . Family history of anesthesia complication     pt's mother and sister have hx. of post-op N/V  . Trigger thumb of right hand 08/2012    Past Surgical History  Procedure Laterality Date  . Tubal ligation  1993  . Pelvic laparoscopy  11/04/1999    with fulguration of endometriosis  . Colonoscopy  05/2009    [id number] hemorrhoids otherwise normal colon, rectum and terminal ileum  . Carpal tunnel release  01/03/2012    Procedure: CARPAL TUNNEL RELEASE;  Surgeon: Nicki Reaper, MD;  Location: [facility];  Service: Orthopedics;  Laterality: Right;  . Breath tek h pylori N/A 08/13/2012    Procedure: BREATH TEK H PYLORI;  Surgeon: Mariella Saa, MD;  Location: [facility];  Service: General;  Laterality: N/A;  . Laparoscopy      due to endometriosis  . Trigger finger release Right 09/24/2012    Procedure: RELEASE A-1 PULLEY OF RIGHT THUMB;  Surgeon: Nicki Reaper, MD;  Location: [facility];  Service: Orthopedics;  Laterality: Right;  . Esophagogastroduodenoscopy  5/013/2011    [id number]:[id number] esophagus/multiple polyps removed s/p (hyperplastic). Gastritis without H.pylori.    Family History  Problem Relation Age of Onset  . Hypertension Father   . Hyperlipidemia Father   . COPD Mother   . Heart disease Mother   . Cancer Mother     Cervix  . Anesthesia problems Mother     post-op N/V  . Thyroid disease Sister   . Anesthesia problems Sister     post-op N/V  . Thyroid disease Paternal Uncle     History   Social History  . Marital  Status: Married    Spouse Name: N/A    Number of Children: 1  . Years of Education: N/A   Occupational History  . Application for disability pending    Social History Main Topics  . Smoking status: Never  Smoker   . Smokeless tobacco: Never Used  . Alcohol Use: No  . Drug Use: No  . Sexual Activity: Yes   Other Topics Concern  . Not on file   Social History Narrative  . No narrative on file      ROS:  General: Negative for anorexia, weight loss, fever, chills, fatigue, weakness. Eyes: Negative for vision changes.  ENT: Negative for hoarseness, difficulty swallowing , nasal congestion. CV: Negative for chest pain, angina, palpitations, dyspnea on exertion, peripheral edema.  Respiratory: Negative for dyspnea at rest, dyspnea on exertion, cough, sputum, wheezing.  GI: See history of present illness. GU:  Negative for dysuria, hematuria, urinary incontinence, urinary frequency, nocturnal urination.  MS: Negative for joint pain, low back pain.  Derm: Negative for rash or itching.  Neuro: Negative for weakness, abnormal sensation, seizure, frequent headaches, memory loss, confusion.  Psych: Negative for anxiety, depression, suicidal ideation, hallucinations.  Endo: Negative for unusual weight change.  Heme: Negative for bruising or bleeding. Allergy: Negative for rash or hives.    Physical Examination:  BP 153/95  Pulse 103  Temp(Src) 97.8 F (36.6 C) (Oral)  Ht 5\' 3"  (1.6 m)  Wt 245 lb 12.8 oz (111.494 kg)  BMI 43.55 kg/m2   General: Well-nourished, well-developed in no acute distress.  Head: Normocephalic, atraumatic.   Eyes: Conjunctiva pink, no icterus. Mouth: Oropharyngeal mucosa moist and pink , no lesions erythema or exudate. Neck: Supple without thyromegaly, masses, or lymphadenopathy.  Lungs: Clear to auscultation bilaterally.  Heart: Regular rate and rhythm, no murmurs rubs or gallops.  Abdomen: Bowel sounds are normal, nontender, nondistended, no  hepatosplenomegaly or masses, no abdominal bruits or    hernia , no rebound or guarding.   Rectal: deferred Extremities: No lower extremity edema. No clubbing or deformities.  Neuro: Alert and oriented x 4 , grossly normal neurologically.  Skin: Warm and dry, no rash or jaundice.   Psych: Alert and cooperative, normal mood and affect.  Labs: Lab Results  Component Value Date   IRON 15* 09/20/2012   TIBC 400 09/20/2012   FERRITIN 4* 09/20/2012   Lab Results  Component Value Date   WBC 10.0 09/20/2012   HGB 9.5* 10/09/2012   HCT 36.0 09/24/2012   MCV 65.1* 09/20/2012   PLT 383 09/20/2012   Lab Results  Component Value Date   CREATININE 0.90 09/24/2012   BUN 14 09/24/2012   NA 140 09/24/2012   K 3.5 09/24/2012   CL 107 09/24/2012   CO2 21 07/13/2012   Lab Results  Component Value Date   ALT 15 07/13/2012   AST 12 07/13/2012   ALKPHOS 111 07/13/2012   BILITOT 0.4 07/13/2012   Lab Results  Component Value Date   TSH 0.784 07/13/2012     Imaging Studies: No results found.

## 2012-10-17 ENCOUNTER — Encounter (HOSPITAL_COMMUNITY): Payer: Self-pay | Admitting: Pharmacy Technician

## 2012-10-18 ENCOUNTER — Ambulatory Visit (INDEPENDENT_AMBULATORY_CARE_PROVIDER_SITE_OTHER): Payer: Medicare Other | Admitting: General Surgery

## 2012-10-25 ENCOUNTER — Encounter (INDEPENDENT_AMBULATORY_CARE_PROVIDER_SITE_OTHER): Payer: Medicare Other | Admitting: General Surgery

## 2012-10-25 DIAGNOSIS — F332 Major depressive disorder, recurrent severe without psychotic features: Secondary | ICD-10-CM | POA: Diagnosis not present

## 2012-10-26 ENCOUNTER — Other Ambulatory Visit: Payer: Self-pay | Admitting: Family Medicine

## 2012-10-27 ENCOUNTER — Other Ambulatory Visit: Payer: Self-pay | Admitting: Cardiology

## 2012-10-28 ENCOUNTER — Encounter (HOSPITAL_COMMUNITY)
Admission: RE | Disposition: A | Discharge status: Home / Self Care | Payer: Self-pay | Source: Ambulatory Visit | Attending: Internal Medicine

## 2012-10-28 ENCOUNTER — Encounter (HOSPITAL_COMMUNITY): Payer: Self-pay | Admitting: *Deleted

## 2012-10-28 ENCOUNTER — Ambulatory Visit: Payer: Medicare Other | Admitting: Gastroenterology

## 2012-10-28 ENCOUNTER — Ambulatory Visit (HOSPITAL_COMMUNITY)
Admission: RE | Admit: 2012-10-28 | Discharge: 2012-10-28 | Disposition: A | Discharge status: Home / Self Care | Payer: Medicare Other | Source: Ambulatory Visit | Attending: Internal Medicine | Admitting: Internal Medicine

## 2012-10-28 DIAGNOSIS — Z794 Long term (current) use of insulin: Secondary | ICD-10-CM | POA: Diagnosis not present

## 2012-10-28 DIAGNOSIS — I10 Essential (primary) hypertension: Secondary | ICD-10-CM | POA: Diagnosis not present

## 2012-10-28 DIAGNOSIS — Z01812 Encounter for preprocedural laboratory examination: Secondary | ICD-10-CM | POA: Insufficient documentation

## 2012-10-28 DIAGNOSIS — K219 Gastro-esophageal reflux disease without esophagitis: Secondary | ICD-10-CM

## 2012-10-28 DIAGNOSIS — K6389 Other specified diseases of intestine: Secondary | ICD-10-CM | POA: Diagnosis not present

## 2012-10-28 DIAGNOSIS — D131 Benign neoplasm of stomach: Secondary | ICD-10-CM | POA: Diagnosis not present

## 2012-10-28 DIAGNOSIS — K5730 Diverticulosis of large intestine without perforation or abscess without bleeding: Secondary | ICD-10-CM | POA: Diagnosis not present

## 2012-10-28 DIAGNOSIS — D509 Iron deficiency anemia, unspecified: Secondary | ICD-10-CM | POA: Diagnosis not present

## 2012-10-28 DIAGNOSIS — E119 Type 2 diabetes mellitus without complications: Secondary | ICD-10-CM | POA: Insufficient documentation

## 2012-10-28 HISTORY — PX: GIVENS CAPSULE STUDY: SHX5432

## 2012-10-28 HISTORY — PX: COLONOSCOPY WITH ESOPHAGOGASTRODUODENOSCOPY (EGD): SHX5779

## 2012-10-28 LAB — GLUCOSE, CAPILLARY: Glucose-Capillary: 136 mg/dL — ABNORMAL HIGH (ref 70–99)

## 2012-10-28 SURGERY — COLONOSCOPY WITH ESOPHAGOGASTRODUODENOSCOPY (EGD)
Anesthesia: Moderate Sedation

## 2012-10-28 MED ORDER — SODIUM CHLORIDE 0.9 % IJ SOLN
INTRAMUSCULAR | Status: AC
  Filled 2012-10-28: qty 10

## 2012-10-28 MED ORDER — PROMETHAZINE HCL 25 MG/ML IJ SOLN
25.0000 mg | Freq: Once | INTRAMUSCULAR | Status: AC
  Administered 2012-10-28: 25 mg via INTRAVENOUS

## 2012-10-28 MED ORDER — ONDANSETRON HCL 4 MG/2ML IJ SOLN
INTRAMUSCULAR | Status: DC | PRN
  Administered 2012-10-28: 4 mg via INTRAVENOUS

## 2012-10-28 MED ORDER — MIDAZOLAM HCL 5 MG/5ML IJ SOLN
INTRAMUSCULAR | Status: DC | PRN
  Administered 2012-10-28: 1 mg via INTRAVENOUS
  Administered 2012-10-28: 2 mg via INTRAVENOUS
  Administered 2012-10-28 (×3): 1 mg via INTRAVENOUS

## 2012-10-28 MED ORDER — SODIUM CHLORIDE 0.9 % IV SOLN
INTRAVENOUS | Status: DC
  Administered 2012-10-28: 1000 mL via INTRAVENOUS

## 2012-10-28 MED ORDER — PROMETHAZINE HCL 25 MG/ML IJ SOLN
INTRAMUSCULAR | Status: AC
  Filled 2012-10-28: qty 1

## 2012-10-28 MED ORDER — STERILE WATER FOR IRRIGATION IR SOLN
Status: DC | PRN
  Administered 2012-10-28: 08:00:00

## 2012-10-28 MED ORDER — MEPERIDINE HCL 100 MG/ML IJ SOLN
INTRAMUSCULAR | Status: DC | PRN
  Administered 2012-10-28: 50 mg via INTRAVENOUS
  Administered 2012-10-28 (×3): 25 mg via INTRAVENOUS

## 2012-10-28 MED ORDER — ONDANSETRON HCL 4 MG/2ML IJ SOLN
INTRAMUSCULAR | Status: AC
  Filled 2012-10-28: qty 2

## 2012-10-28 MED ORDER — MEPERIDINE HCL 100 MG/ML IJ SOLN
INTRAMUSCULAR | Status: AC
  Filled 2012-10-28: qty 2

## 2012-10-28 MED ORDER — BUTAMBEN-TETRACAINE-BENZOCAINE 2-2-14 % EX AERO
INHALATION_SPRAY | CUTANEOUS | Status: DC | PRN
  Administered 2012-10-28: 2 via TOPICAL

## 2012-10-28 MED ORDER — MIDAZOLAM HCL 5 MG/5ML IJ SOLN
INTRAMUSCULAR | Status: AC
  Filled 2012-10-28: qty 10

## 2012-10-28 NOTE — H&P (View-Only) (Signed)
Primary Care Physician:  Lilyan Punt, MD  Primary Gastroenterologist:  Roetta Sessions, MD   Chief Complaint  Patient presents with  . Colonoscopy  . EGD    HPI:  Carrie Cook is a 52 y.o. female here for further evaluation of iron deficiency anemia. Gastric bypass pending in the near future. Recently was advised that she needed further evaluation of her iron deficiency by her PCP. We saw the size patient back in 2011 for iron deficiency anemia in the setting of heavy menses at that time. She underwent a colonoscopy with terminal ileoscopy. She had hemorrhoids. She also had an upper endoscopy that showed gastric polyps which were benign, gastritis without H. pylori. Celiac serologies were negative.   Patient states she has been intermittently taking iron since that time but had not been on this year up until about 3 weeks ago. Remotely used to give blood that it has been greater than 5 years. No menses in over one year on Provera. Complains of bad heartburn and regurgitation. No dysphagia. No vomiting. No melena, brbpr. No constipation/diarrhea. No abdominal pain. Could not afford Aciphex. So on omeprazole.  Denies hematuria, nosebleeds.  Heme negative X 3.   Current Outpatient Prescriptions  Medication Sig Dispense Refill  . albuterol (PROVENTIL HFA;VENTOLIN HFA) 108 (90 BASE) MCG/ACT inhaler Inhale 2 puffs into the lungs every 6 (six) hours as needed.        Marland Kitchen amLODipine (NORVASC) 5 MG tablet TAKE ONE TABLET BY MOUTH EVERY DAY  90 tablet  2  . budesonide-formoterol (SYMBICORT) 160-4.5 MCG/ACT inhaler Inhale 2 puffs into the lungs 2 (two) times daily as needed.       . divalproex (DEPAKOTE) 500 MG DR tablet Take 1,500 mg by mouth every other day. AND 2000 MG. EVERY OTHER DAY      . estradiol (ESTRACE) 2 MG tablet Take 1 tablet (2 mg total) by mouth daily.  30 tablet  11  . Ferrous Sulfate (IRON) 325 (65 FE) MG TABS Take by mouth daily.      . insulin lispro (HUMALOG PEN) 100 UNIT/ML  injection Inject into the skin daily. 10-20 units 2 to 3 times daily prn      . INVOKANA 100 MG TABS TAKE ONE TABLET BY MOUTH EVERY DAY  30 tablet  2  . lamoTRIgine (LAMICTAL) 25 MG tablet Take 100 mg by mouth daily.       Marland Kitchen loratadine (CLARITIN) 10 MG tablet Take 10 mg by mouth daily.      . medroxyPROGESTERone (PROVERA) 5 MG tablet Take 1 tablet (5 mg total) by mouth daily.  30 tablet  11  . metoprolol tartrate (LOPRESSOR) 25 MG tablet Take 1 tablet (25 mg total) by mouth 2 (two) times daily.  60 tablet  3  . Multiple Vitamin (MULTIVITAMIN) tablet Take 1 tablet by mouth daily. With iron      . NOVOLIN 70/30 RELION (70-30) 100 UNIT/ML injection INJECT 50 UNITS EVERY DAY IN THE MORNING AND 40 UNITS EVERY DAY IN THE EVENING  30 mL  0  . omeprazole (PRILOSEC) 20 MG capsule Take 20 mg by mouth daily.      Marland Kitchen RELION SHORT PEN NEEDLES 31G X 8 MM MISC USE AS DIRECTED  100 each  0  . risperiDONE (RISPERDAL) 0.5 MG tablet 0.5 mg.       . valsartan-hydrochlorothiazide (DIOVAN-HCT) 320-25 MG per tablet TAKE ONE TABLET BY MOUTH EVERY DAY  90 tablet  0  . vitamin C (ASCORBIC ACID)  500 MG tablet Take 500 mg by mouth daily.      . zaleplon (SONATA) 10 MG capsule Take 10 mg by mouth at bedtime.        No current facility-administered medications for this visit.    Allergies as of 10/16/2012 - Review Complete 10/16/2012  Allergen Reaction Noted  . Codeine Nausea And Vomiting   . Prozac [fluoxetine hcl] Hives 05/11/2009  . Dyazide [hydrochlorothiazide w-triamterene] Cough 05/11/2009  . Erythromycin Nausea Only 01/19/2011  . Lisinopril Cough 05/11/2009  . Pravastatin Other (See Comments) 06/21/2012  . Sulfonamide derivatives Rash     Past Medical History  Diagnosis Date  . Hyperlipidemia     no current med.  . Obesity   . Gastroesophageal reflux disease   . Anxiety   . Depression   . H/O hiatal hernia   . PONV (postoperative nausea and vomiting)   . Migraines   . Asthma     daily and prn  inhalers  . Anemia   . Shortness of breath     with daily activities  . Degenerative joint disease     right knee, spine  . Lock jaw     jaw locks open if opens mouth wide  . Bipolar disorder   . Sleep apnea     uses CPAP nightly  . History of endometriosis   . Insulin dependent diabetes mellitus   . Hypertension     under control with med., has been on med. x 2 yr.  . Palpitations   . Dental crowns present   . Family history of anesthesia complication     pt's mother and sister have hx. of post-op N/V  . Trigger thumb of right hand 08/2012    Past Surgical History  Procedure Laterality Date  . Tubal ligation  1993  . Pelvic laparoscopy  11/04/1999    with fulguration of endometriosis  . Colonoscopy  05/2009    [id number] hemorrhoids otherwise normal colon, rectum and terminal ileum  . Carpal tunnel release  01/03/2012    Procedure: CARPAL TUNNEL RELEASE;  Surgeon: Nicki Reaper, MD;  Location: [facility];  Service: Orthopedics;  Laterality: Right;  . Breath tek h pylori N/A 08/13/2012    Procedure: BREATH TEK H PYLORI;  Surgeon: Mariella Saa, MD;  Location: [facility];  Service: General;  Laterality: N/A;  . Laparoscopy      due to endometriosis  . Trigger finger release Right 09/24/2012    Procedure: RELEASE A-1 PULLEY OF RIGHT THUMB;  Surgeon: Nicki Reaper, MD;  Location: [facility];  Service: Orthopedics;  Laterality: Right;  . Esophagogastroduodenoscopy  5/013/2011    [id number] esophagus/multiple polyps removed s/p (hyperplastic). Gastritis without H.pylori.    Family History  Problem Relation Age of Onset  . Hypertension Father   . Hyperlipidemia Father   . COPD Mother   . Heart disease Mother   . Cancer Mother     Cervix  . Anesthesia problems Mother     post-op N/V  . Thyroid disease Sister   . Anesthesia problems Sister     post-op N/V  . Thyroid disease Paternal Uncle     History   Social History  . Marital  Status: Married    Spouse Name: N/A    Number of Children: 1  . Years of Education: N/A   Occupational History  . Application for disability pending    Social History Main Topics  . Smoking status: Never  Smoker   . Smokeless tobacco: Never Used  . Alcohol Use: No  . Drug Use: No  . Sexual Activity: Yes   Other Topics Concern  . Not on file   Social History Narrative  . No narrative on file      ROS:  General: Negative for anorexia, weight loss, fever, chills, fatigue, weakness. Eyes: Negative for vision changes.  ENT: Negative for hoarseness, difficulty swallowing , nasal congestion. CV: Negative for chest pain, angina, palpitations, dyspnea on exertion, peripheral edema.  Respiratory: Negative for dyspnea at rest, dyspnea on exertion, cough, sputum, wheezing.  GI: See history of present illness. GU:  Negative for dysuria, hematuria, urinary incontinence, urinary frequency, nocturnal urination.  MS: Negative for joint pain, low back pain.  Derm: Negative for rash or itching.  Neuro: Negative for weakness, abnormal sensation, seizure, frequent headaches, memory loss, confusion.  Psych: Negative for anxiety, depression, suicidal ideation, hallucinations.  Endo: Negative for unusual weight change.  Heme: Negative for bruising or bleeding. Allergy: Negative for rash or hives.    Physical Examination:  BP 153/95  Pulse 103  Temp(Src) 97.8 F (36.6 C) (Oral)  Ht 5\' 3"  (1.6 m)  Wt 245 lb 12.8 oz (111.494 kg)  BMI 43.55 kg/m2   General: Well-nourished, well-developed in no acute distress.  Head: Normocephalic, atraumatic.   Eyes: Conjunctiva pink, no icterus. Mouth: Oropharyngeal mucosa moist and pink , no lesions erythema or exudate. Neck: Supple without thyromegaly, masses, or lymphadenopathy.  Lungs: Clear to auscultation bilaterally.  Heart: Regular rate and rhythm, no murmurs rubs or gallops.  Abdomen: Bowel sounds are normal, nontender, nondistended, no  hepatosplenomegaly or masses, no abdominal bruits or    hernia , no rebound or guarding.   Rectal: deferred Extremities: No lower extremity edema. No clubbing or deformities.  Neuro: Alert and oriented x 4 , grossly normal neurologically.  Skin: Warm and dry, no rash or jaundice.   Psych: Alert and cooperative, normal mood and affect.  Labs: Lab Results  Component Value Date   IRON 15* 09/20/2012   TIBC 400 09/20/2012   FERRITIN 4* 09/20/2012   Lab Results  Component Value Date   WBC 10.0 09/20/2012   HGB 9.5* 10/09/2012   HCT 36.0 09/24/2012   MCV 65.1* 09/20/2012   PLT 383 09/20/2012   Lab Results  Component Value Date   CREATININE 0.90 09/24/2012   BUN 14 09/24/2012   NA 140 09/24/2012   K 3.5 09/24/2012   CL 107 09/24/2012   CO2 21 07/13/2012   Lab Results  Component Value Date   ALT 15 07/13/2012   AST 12 07/13/2012   ALKPHOS 111 07/13/2012   BILITOT 0.4 07/13/2012   Lab Results  Component Value Date   TSH 0.784 07/13/2012     Imaging Studies: No results found.

## 2012-10-28 NOTE — Op Note (Signed)
[facility] [address] [place], [address]   COLONOSCOPY PROCEDURE REPORT  PATIENT: Carrie Cook, Carrie Cook  MR#:         [id number] BIRTHDATE: April 30, 1960 , 52  yrs. old GENDER: Female ENDOSCOPIST: R.  Roetta Sessions, MD [facility] REFERRED BY:  [facility] PROCEDURE DATE:  10/28/2012 PROCEDURE:     Ileocolonoscopy-diagnostic  INDICATIONS: iron deficiency anemia  INFORMED CONSENT:  The risks, benefits, alternatives and imponderables including but not limited to bleeding, perforation as well as the possibility of a missed lesion have been reviewed.  The potential for biopsy, lesion removal, etc. have also been discussed.  Questions have been answered.  All parties agreeable. Please see the history and physical in the medical record for more information.  MEDICATIONS: Versed 4 mg IV and Demerol 50 mg IV in divided doses. Zofran 4 mg IV  DESCRIPTION OF PROCEDURE:  After a digital rectal exam was performed, the EC-3890Li ([id number]) and EG-2990i ([id number]) colonoscope was advanced from the anus through the rectum and colon to the area of the cecum, ileocecal valve and appendiceal orifice. The cecum was deeply intubated.  These structures were well-seen and photographed for the record.  From the level of the cecum and ileocecal valve, the scope was slowly and cautiously withdrawn. The mucosal surfaces were carefully surveyed utilizing scope tip deflection to facilitate fold flattening as needed.  The scope was pulled down into the rectum where a thorough examination including retroflexion was performed.    FINDINGS:  Adequate preparation. Normal rectum. Few scattered narrow mouth sigmoid diverticula; the remainder of the colonic mucosa appeared normal. The distal 10 cm of terminal ileal mucosa appeared normal.  THERAPEUTIC / DIAGNOSTIC MANEUVERS PERFORMED:  None  COMPLICATIONS: none  CECAL WITHDRAWAL TIME:  9 minutes  IMPRESSION:  Mild colonic  diverticulosis; otherwise normal ileocolonoscopy  RECOMMENDATIONS: See EGD report.   _______________________________ eSigned:  R. Roetta Sessions, MD FACP [facility] 10/28/2012 8:34 AM   CC:

## 2012-10-28 NOTE — Interval H&P Note (Signed)
History and Physical Interval Note:  10/28/2012 7:52 AM  Carrie Cook  has presented today for surgery, with the diagnosis of IDA  AND GERD  The various methods of treatment have been discussed with the patient and family. After consideration of risks, benefits and other options for treatment, the patient has consented to  Procedure(s) with comments: COLONOSCOPY WITH ESOPHAGOGASTRODUODENOSCOPY (EGD) (N/A) - 7:30 GIVENS CAPSULE STUDY (N/A) as a surgical intervention .  The patient's history has been reviewed, patient examined, no change in status, stable for surgery.  I have reviewed the patient's chart and labs.  Questions were answered to the patient's satisfaction.      The change. Colonoscopy followed by EGD and possible capsule study per plan.The risks, benefits, limitations, alternatives and imponderables have been reviewed with the patient. Questions have been answered. All parties are agreeable.    Eula Listen

## 2012-10-28 NOTE — Op Note (Signed)
[facility] [address] [place], [address]   ENDOSCOPY PROCEDURE REPORT  PATIENT: Carrie Cook, Carrie Cook  MR#: [id number] BIRTHDATE: 02-23-1960 , 52  yrs. old GENDER: Female ENDOSCOPIST: R.  Roetta Sessions, MD FACP [facility] REFERRED BY:  [facility] PROCEDURE DATE:  10/28/2012 PROCEDURE:     EGD with small bowel video capsule appointment  INDICATIONS:     Refractory iron deficiency anemia; Hemoccult negative stool in the colon negative ileocolonoscopy  INFORMED CONSENT:   The risks, benefits, limitations, alternatives and imponderables have been discussed.  The potential for biopsy, esophogeal dilation, etc. have also been reviewed.  Questions have been answered.  All parties agreeable.  Please see the history and physical in the medical record for more information.  MEDICATIONS:  Versed 6 mg IV and Demerol 125 mg IV in divided doses. Cetacaine spray. Zofran 4 mg IV.  DESCRIPTION OF PROCEDURE:   The EG-2990i ([id number]) and EC-3890Li ([id number])  endoscope was introduced through the mouth and advanced to the second portion of the duodenum without difficulty or limitations.  The mucosal surfaces were surveyed very carefully during advancement of the scope and upon withdrawal.  Retroflexion view of the proximal stomach and esophagogastric junction was performed.      FINDINGS: Normal esophagus. Stomach empty. Scattered 2-5 mm benign-appearing gastric polyps (Largest of these previously biopsied and proven to be benign, hyperplastic). Otherwise, the remainder of the gastric mucosa appeared normal. Patent pylorus. Normal first and second portion of the duodenum.  THERAPEUTIC / DIAGNOSTIC MANEUVERS PERFORMED:  capsule deployment device loaded onto the gastroscope. Scope reintroduced into the stomach ,advanced into the duodenum. Video capsule deployed into the duodenum uneventfully.   COMPLICATIONS:  None  IMPRESSION:  Gastric polyps; otherwise,  negative EGD; status post capsule deployment into duodenum  RECOMMENDATIONS:   Followup on video capsule images as they become available. Celiac screen previously negative.  Gastric mucosal biopsies previously negative for H. pylori. Patient reportedly had a breath test during the summer. Patient is unaware of the testing results and we cannot find any such results in Epic at this time. Both histology and breath testing depends on metabolic activity of any potential H. pylori infection. Will go ahead and draw H. pylori serologies to see if there is any serological evidence of exposure to this infection previously.  See colonoscopy report.    _______________________________ R. Roetta Sessions, MD FACP [facility] eSigned:  R. Roetta Sessions, MD FACP [facility] 10/28/2012 8:57 AM     CC:  PATIENT NAME:  Carrie Cook, Carrie Cook MR#: [id number]

## 2012-10-28 NOTE — Interval H&P Note (Signed)
History and Physical Interval Note:  10/28/2012 7:37 AM  Barbette Merino  has presented today for surgery, with the diagnosis of IDA  AND GERD  The various methods of treatment have been discussed with the patient and family. After consideration of risks, benefits and other options for treatment, the patient has consented to  Procedure(s) with comments: COLONOSCOPY WITH ESOPHAGOGASTRODUODENOSCOPY (EGD) (N/A) - 7:30 GIVENS CAPSULE STUDY (N/A) as a surgical intervention .  The patient's history has been reviewed, patient examined, no change in status, stable for surgery.  I have reviewed the patient's chart and labs.  Questions were answered to the patient's satisfaction.     Eula Listen

## 2012-10-29 ENCOUNTER — Telehealth: Payer: Self-pay | Admitting: Gastroenterology

## 2012-10-29 ENCOUNTER — Other Ambulatory Visit: Payer: Self-pay | Admitting: Cardiology

## 2012-10-29 ENCOUNTER — Encounter (HOSPITAL_COMMUNITY): Payer: Self-pay | Admitting: Internal Medicine

## 2012-10-29 LAB — H. PYLORI ANTIBODY, IGG: H Pylori IgG: 0.75 {ISR}

## 2012-10-29 NOTE — Op Note (Signed)
Small Bowel Givens Capsule Study Procedure date:  10/28/2012  Ordering Provider:  Roetta Sessions, MD PCP:  Dr. Lilyan Punt, MD  Indication for procedure:  Iron Deficiency Anemia. Heme negative x 3. EGD/colonoscopy 10/28/12 showed gastric polyps (previously benign biopsies in 2011). Few scattered sigmoid diverticula, normal distal terminal ileum 10 cm. In 2011, she had negative gastric biopsies for H. pylori, negative celiac serologies. Reportedly had negative H. pylori breath test. H. pylori serologies currently pending.  Patient data:  Wt: 244 lb Ht: 63 in Waist: 48 in  Findings:  Single nonbleeding erosion at 1 hr 49 min 08 sec. Otherwise normal appearing small bowel.  First Gastric image:  N/A. Givens capsule placed at time of EGD. First Duodenal image: 1 hr 15 min 24 sec First Ileo-Cecal Valve image: 5 hr 46 min 25 sec First Cecal image: 6 hr 8 min 28 sec Gastric Passage time: N/A Small Bowel Passage time:  4 hr 53 min  Summary & Recommendations: Study reviewed with Dr. Jena Gauss. Essentially unremarkable small bowel capsule endoscopy. She had a very tiny nonbleeding erosion noted in the proximal small bowel. Likely insignificant. History of iron deficiency anemia, Hemoccult negative stool x3. Consider iron malabsorption at this point. No GI source for iron deficiency anemia detected. Consider hematology evaluation. At this time patient would be cleared for gastric bypass from a GI standpoint; however, would encourage hematology evaluation for management of iron deficiency anemia due to malabsorption can which will be ongoing issue status post gastric bypass. We will follow-up on pending H.pylori serologies.

## 2012-10-29 NOTE — Telephone Encounter (Signed)
Please let patient know that her small bowel capsule was unremarkable. She had a single nonbleeding erosion in the proximal small bowel, likely insignificant. Her GI workup has failed to reveal a cause of her iron deficiency anemia. Suspect she has a malabsorption issue.  From a GI standpoint, she would be cleared for gastric bypass.  She needs to see a hematologist regarding her iron deficiency as her malabsorption issue will likely be worsened by gastric bypass. She may be a candidate for parenteral iron therapy.  She can followup with Dr. Koren Shiver regarding seeing a hematologist or we can make the referral. Which ever she prefers will be fine.

## 2012-10-31 ENCOUNTER — Ambulatory Visit (INDEPENDENT_AMBULATORY_CARE_PROVIDER_SITE_OTHER): Payer: Medicare Other | Admitting: General Surgery

## 2012-10-31 ENCOUNTER — Other Ambulatory Visit: Payer: Self-pay | Admitting: Gastroenterology

## 2012-10-31 ENCOUNTER — Encounter (INDEPENDENT_AMBULATORY_CARE_PROVIDER_SITE_OTHER): Payer: Self-pay | Admitting: General Surgery

## 2012-10-31 ENCOUNTER — Other Ambulatory Visit: Payer: Self-pay | Admitting: Cardiology

## 2012-10-31 DIAGNOSIS — D509 Iron deficiency anemia, unspecified: Secondary | ICD-10-CM

## 2012-10-31 NOTE — Patient Instructions (Signed)
Follow preop diet carefully and do your bowel prep the day before surgery. Call for any questions.

## 2012-10-31 NOTE — Telephone Encounter (Signed)
Pt is aware. She said that it was fine to send her to a hematologist and she doesn't have a preference. She is asking for a asap appt because her gastric bypass surgery is scheduled for 11/19/12 and she would like to see them before her surgery if possible.  Benedetto Goad, please refer pt.

## 2012-10-31 NOTE — Progress Notes (Signed)
Chief complaint: Preop visit for gastric bypass  History: The patient returns for a preop visit prior to planned laparoscopic Roux-en-Y gastric bypass for multiple comorbidities as below. She has successfully completed her preoperative workup. There were no concerns of slight nutrition evaluation. Ultrasound was unremarkable. Lab work did however show a significant iron deficiency anemia with a hemoglobin of 9.4. She underwent a thorough GI workup including upper and lower endoscopy which were entirely negative and small bowel capsule endoscopy that showed a tiny insignificant small bowel erosion. She is on iron therapy and may require iron infusion. We discussed that her bypass surgery will potentially further interfere with iron absorption and she may require iron infusions postoperatively. However I think the benefit of significant weight loss would outweigh his disadvantaged and she is in agreement. She currently is feeling well.  Past Medical History  Diagnosis Date  . Hyperlipidemia     no current med.  . Obesity   . Gastroesophageal reflux disease   . Anxiety   . Depression   . H/O hiatal hernia   . PONV (postoperative nausea and vomiting)   . Migraines   . Asthma     daily and prn inhalers  . Anemia   . Shortness of breath     with daily activities  . Degenerative joint disease     right knee, spine  . Lock jaw     jaw locks open if opens mouth wide  . Bipolar disorder   . Sleep apnea     uses CPAP nightly  . History of endometriosis   . Insulin dependent diabetes mellitus   . Hypertension     under control with med., has been on med. x 2 yr.  . Palpitations   . Dental crowns present   . Family history of anesthesia complication     pt's mother and sister have hx. of post-op N/V  . Trigger thumb of right hand 08/2012   Past Surgical History  Procedure Laterality Date  . Tubal ligation  1993  . Pelvic laparoscopy  11/04/1999    with fulguration of endometriosis  .  Colonoscopy  05/2009    RMR:external hemorrhoids otherwise normal colon, rectum and terminal ileum  . Carpal tunnel release  01/03/2012    Procedure: CARPAL TUNNEL RELEASE;  Surgeon: Gary R Kuzma, MD;  Location: [facility];  Service: Orthopedics;  Laterality: Right;  . Breath tek h pylori N/A 08/13/2012    Procedure: BREATH TEK H PYLORI;  Surgeon: Kealii Thueson T Syble Picco, MD;  Location: [facility];  Service: General;  Laterality: N/A;  . Laparoscopy      due to endometriosis  . Trigger finger release Right 09/24/2012    Procedure: RELEASE A-1 PULLEY OF RIGHT THUMB;  Surgeon: Gary R Kuzma, MD;  Location: [facility];  Service: Orthopedics;  Laterality: Right;  . Esophagogastroduodenoscopy  5/013/2011    RMR:normal esophagus/multiple polyps removed s/p (hyperplastic). Gastritis without H.pylori.  . Carpal tunnel release right hand Right 12/13  . Colonoscopy with esophagogastroduodenoscopy (egd) N/A 10/28/2012    Procedure: COLONOSCOPY WITH ESOPHAGOGASTRODUODENOSCOPY (EGD);  Surgeon: Robert M Rourk, MD;  Location: [facility];  Service: Endoscopy;  Laterality: N/A;  7:30  . Givens capsule study N/A 10/28/2012    Procedure: GIVENS CAPSULE STUDY;  Surgeon: Robert M Rourk, MD;  Location: [facility];  Service: Endoscopy;  Laterality: N/A;   Current Outpatient Prescriptions  Medication Sig Dispense Refill  . albuterol (PROVENTIL HFA;VENTOLIN HFA) 108 (90   BASE) MCG/ACT inhaler Inhale 2 puffs into the lungs every 6 (six) hours as needed for wheezing.       . amLODipine (NORVASC) 5 MG tablet Take 5 mg by mouth daily.      . budesonide-formoterol (SYMBICORT) 160-4.5 MCG/ACT inhaler Inhale 2 puffs into the lungs 2 (two) times daily as needed.       . Canagliflozin (INVOKANA) 100 MG TABS Take 50 mg by mouth daily.      . divalproex (DEPAKOTE) 500 MG DR tablet Take 1,500-2,000 mg by mouth daily. Takes 1500 mg on day 1 then 2000 mg on day 2. Repeat      . estradiol (ESTRACE) 2 MG  tablet Take 1 tablet (2 mg total) by mouth daily.  30 tablet  11  . Ferrous Sulfate (IRON) 325 (65 FE) MG TABS Take 1 tablet by mouth daily.       . insulin lispro (HUMALOG PEN) 100 UNIT/ML injection Inject 10-20 Units into the skin 3 (three) times daily as needed for high blood sugar.       . insulin NPH-regular (NOVOLIN 70/30) (70-30) 100 UNIT/ML injection Inject 40-60 Units into the skin 2 (two) times daily.      . lamoTRIgine (LAMICTAL) 100 MG tablet Take 100 mg by mouth daily.      . loratadine (CLARITIN) 10 MG tablet Take 10 mg by mouth daily.      . medroxyPROGESTERone (PROVERA) 5 MG tablet Take 1 tablet (5 mg total) by mouth daily.  30 tablet  11  . metoprolol tartrate (LOPRESSOR) 25 MG tablet Take 1 tablet (25 mg total) by mouth 2 (two) times daily.  60 tablet  3  . omeprazole (PRILOSEC) 20 MG capsule Take 20 mg by mouth daily.      . valsartan-hydrochlorothiazide (DIOVAN-HCT) 320-25 MG per tablet Take 1 tablet by mouth daily.      . venlafaxine (EFFEXOR) 75 MG tablet       . vitamin C (ASCORBIC ACID) 500 MG tablet Take 500 mg by mouth daily.       No current facility-administered medications for this visit.   Allergies  Allergen Reactions  . Codeine Nausea And Vomiting  . Prozac [Fluoxetine Hcl] Hives  . Dyazide [Hydrochlorothiazide W-Triamterene] Cough  . Erythromycin Nausea Only  . Lisinopril Cough  . Pravastatin Other (See Comments)    BODY ACHES, FLU-LIKE SX.  . Sulfonamide Derivatives Rash   Exam: BP 136/92  Pulse 88  Temp(Src) 98.9 F (37.2 C) (Temporal)  Resp 15  Ht 5' 3" (1.6 m)  Wt 244 lb (110.678 kg)  BMI 43.23 kg/m2 General: Morbidly obese but otherwise well-appearing Caucasian female Skin: No rash or infection Lungs: Clear equal breath sounds bilaterally Cardiac: Regular rate and rhythm. No murmurs. No edema. Abdomen: Soft and nontender. No masses or organomegaly. Neurologic: Affect normal. Alert and oriented. Gait normal.  Assessment and plan: Plan  laparoscopic Roux-en-Y gastric bypass for morbid obesity with multiple comorbidities. Iron Deficiency anemia with a thorough negative workup. We reviewed the procedure and risks and recovery again today. The consent form was reviewed line by line. She was given a bowel prep. All her questions were answered. 

## 2012-10-31 NOTE — Telephone Encounter (Signed)
Referral has been made to Hematology at [facility] 

## 2012-11-11 ENCOUNTER — Telehealth (INDEPENDENT_AMBULATORY_CARE_PROVIDER_SITE_OTHER): Payer: Self-pay | Admitting: *Deleted

## 2012-11-11 ENCOUNTER — Encounter (HOSPITAL_COMMUNITY): Payer: Self-pay | Admitting: Pharmacy Technician

## 2012-11-11 ENCOUNTER — Other Ambulatory Visit: Payer: Self-pay | Admitting: Cardiology

## 2012-11-11 NOTE — Telephone Encounter (Signed)
Patient called today with a question regarding her hormone replacement pills that she takes.  Patient states she saw in the book that it says she will have to stop these however patient states she takes them to prevent a menstrual cycle which in turn helps with her anemia deficiency issues.  Patient is concerned about stopping this medication since this will cause her to menstruate. Explained that I will send a message to Dr. Johna Sheriff to ask what the appropriate plan will be for this patient since she uses them for a medical need then we will give her a call back.  Patient states understanding and agreeable at this time.

## 2012-11-11 NOTE — Telephone Encounter (Signed)
Paged Dr. Johna Sheriff to discuss patient's hormone replacement medication.  Per Dr. Johna Sheriff patient is to stop taking the hormone pills NOW.  Patient at a greater risk for potential bloodclots during and/or after surgery.  Patient made aware and reports that has not taking the hormone pills today and she will remain off of the medication until Dr. Johna Sheriff clears patient to restart medication.  Patient verbalized understanding and is agreeable with plan as above.

## 2012-11-13 ENCOUNTER — Encounter (HOSPITAL_COMMUNITY): Payer: Medicare Other | Attending: Hematology and Oncology

## 2012-11-13 ENCOUNTER — Encounter (HOSPITAL_COMMUNITY): Payer: Self-pay

## 2012-11-13 VITALS — BP 138/82 | HR 103 | Temp 98.5°F | Resp 20 | Ht 63.0 in | Wt 239.0 lb

## 2012-11-13 DIAGNOSIS — K219 Gastro-esophageal reflux disease without esophagitis: Secondary | ICD-10-CM | POA: Diagnosis not present

## 2012-11-13 DIAGNOSIS — D509 Iron deficiency anemia, unspecified: Secondary | ICD-10-CM | POA: Diagnosis not present

## 2012-11-13 DIAGNOSIS — G4730 Sleep apnea, unspecified: Secondary | ICD-10-CM | POA: Diagnosis not present

## 2012-11-13 DIAGNOSIS — E119 Type 2 diabetes mellitus without complications: Secondary | ICD-10-CM | POA: Diagnosis not present

## 2012-11-13 LAB — COMPREHENSIVE METABOLIC PANEL
ALT: 15 U/L (ref 0–35)
AST: 18 U/L (ref 0–37)
Albumin: 3.8 g/dL (ref 3.5–5.2)
Alkaline Phosphatase: 90 U/L (ref 39–117)
BUN: 13 mg/dL (ref 6–23)
CO2: 26 mEq/L (ref 19–32)
Calcium: 9.3 mg/dL (ref 8.4–10.5)
Chloride: 98 mEq/L (ref 96–112)
Creatinine, Ser: 0.7 mg/dL (ref 0.50–1.10)
GFR calc Af Amer: >90 mL/min (ref >90)
GFR calc non Af Amer: >90 mL/min (ref >90)
Glucose, Bld: 110 mg/dL — ABNORMAL HIGH (ref 70–99)
Potassium: 3.9 mEq/L (ref 3.5–5.1)
Sodium: 137 mEq/L (ref 135–145)
Total Bilirubin: 0.2 mg/dL — ABNORMAL LOW (ref 0.3–1.2)
Total Protein: 7.3 g/dL (ref 6.0–8.3)

## 2012-11-13 LAB — IRON AND TIBC
Iron: 16 ug/dL — ABNORMAL LOW (ref 42–135)
Saturation Ratios: 5 % — ABNORMAL LOW (ref 20–55)
TIBC: 322 ug/dL (ref 250–470)
UIBC: 306 ug/dL (ref 125–400)

## 2012-11-13 LAB — CBC WITH DIFFERENTIAL/PLATELET
Basophils Absolute: 0 10*3/uL (ref 0.0–0.1)
Basophils Relative: 0 % (ref 0–1)
Eosinophils Absolute: 0.1 10*3/uL (ref 0.0–0.7)
Eosinophils Relative: 1 % (ref 0–5)
HCT: 38.3 % (ref 36.0–46.0)
Hemoglobin: 11.6 g/dL — ABNORMAL LOW (ref 12.0–15.0)
Lymphocytes Relative: 18 % (ref 12–46)
Lymphs Abs: 1.6 10*3/uL (ref 0.7–4.0)
MCH: 23 pg — ABNORMAL LOW (ref 26.0–34.0)
MCHC: 30.3 g/dL (ref 30.0–36.0)
MCV: 75.8 fL — ABNORMAL LOW (ref 78.0–100.0)
Monocytes Absolute: 0.7 10*3/uL (ref 0.1–1.0)
Monocytes Relative: 8 % (ref 3–12)
Neutro Abs: 6.2 10*3/uL (ref 1.7–7.7)
Neutrophils Relative %: 73 % (ref 43–77)
Platelets: 270 10*3/uL (ref 150–400)
RBC: 5.05 MIL/uL (ref 3.87–5.11)
RDW: 20.8 % — ABNORMAL HIGH (ref 11.5–15.5)
WBC: 8.5 10*3/uL (ref 4.0–10.5)

## 2012-11-13 LAB — RETICULOCYTES
RBC.: 5.05 MIL/uL (ref 3.87–5.11)
Retic Count, Absolute: 106.1 10*3/uL (ref 19.0–186.0)
Retic Ct Pct: 2.1 % (ref 0.4–3.1)

## 2012-11-13 MED ORDER — SODIUM CHLORIDE 0.9 % IV SOLN
1020.0000 mg | Freq: Once | INTRAVENOUS | Status: AC
  Administered 2012-11-13: 1020 mg via INTRAVENOUS
  Filled 2012-11-13: qty 34

## 2012-11-13 MED ORDER — SODIUM CHLORIDE 0.9 % IV SOLN
Freq: Once | INTRAVENOUS | Status: AC
  Administered 2012-11-13: 15:00:00 via INTRAVENOUS

## 2012-11-13 NOTE — Patient Instructions (Signed)
[facility] Discharge Instructions  RECOMMENDATIONS MADE BY THE CONSULTANT AND ANY TEST RESULTS WILL BE SENT TO YOUR REFERRING PHYSICIAN.  Lab work today. Iron infusion today. Return to clinic in 4 weeks for lab work. MD appointment after lab work in 4 weeks.  Thank you for choosing [facility] to provide your oncology and hematology care.  To afford each patient quality time with our providers, please arrive at least 15 minutes before your scheduled appointment time.  With your help, our goal is to use those 15 minutes to complete the necessary work-up to ensure our physicians have the information they need to help with your evaluation and healthcare recommendations.    Effective January 1st, 2014, we ask that you re-schedule your appointment with our physicians should you arrive 10 or more minutes late for your appointment.  We strive to give you quality time with our providers, and arriving late affects you and other patients whose appointments are after yours.    Again, thank you for choosing [facility].  Our hope is that these requests will decrease the amount of time that you wait before being seen by our physicians.       _____________________________________________________________  Should you have questions after your visit to [facility], please contact our office at [phone] between the hours of 8:30 a.m. and 5:00 p.m.  Voicemails left after 4:30 p.m. will not be returned until the following business day.  For prescription refill requests, have your pharmacy contact our office with your prescription refill request.

## 2012-11-13 NOTE — Progress Notes (Signed)
Carrie Cook presented for labwork. Labs per MD order drawn via Peripheral Line 23 gauge needle inserted in right antecubital.  Good blood return present. Procedure without incident.  Needle removed intact. Patient tolerated procedure well.

## 2012-11-13 NOTE — Progress Notes (Signed)
[facility]  NEW PATIENT EVALUATION   Name: Carrie Cook Date: 11/13/2012 MRN: [id number] DOB: [birth year]  PCP: Lilyan Punt, MD   REFERRING PHYSICIAN: Tiffany Kocher, PA-C  REASON FOR REFERRAL: Iron deficiency anemia without an obvious cause of blood loss.     HISTORY OF PRESENT ILLNESS:Carrie Cook is a 52 y.o. female who is referred prior deficiency anemia with a negative workup to explain iron deficiency. She plans to undergo gastric bypass surgery on 11/18/2012 and was recently found to be iron deficient first recognized about 4 years ago. She bled rather profusely vaginally up until your ago when endocrine treatment was started to call that event. She denies any hematuria, melena, hematochezia, epistaxis, or hemoptysis. She denies any fever, night sweats, craving for ice but has noticed changes in her fingernails becoming more brittle and her hair also becoming drier. She denies any teilosis.   PAST MEDICAL HISTORY:  has a past medical history of Hyperlipidemia; Obesity; Gastroesophageal reflux disease; Anxiety; Depression; H/O hiatal hernia; PONV (postoperative nausea and vomiting); Migraines; Asthma; Anemia; Shortness of breath; Degenerative joint disease; Lock jaw; Bipolar disorder; Sleep apnea; History of endometriosis; Insulin dependent diabetes mellitus; Hypertension; Palpitations; Dental crowns present; Family history of anesthesia complication; and Trigger thumb of right hand (08/2012).     PAST SURGICAL HISTORY: Past Surgical History  Procedure Laterality Date  . Tubal ligation  1993  . Pelvic laparoscopy  11/04/1999    with fulguration of endometriosis  . Colonoscopy  05/2009    WUJ:[id number] hemorrhoids otherwise normal colon, rectum and terminal ileum  . Carpal tunnel release  01/03/2012    Procedure: CARPAL TUNNEL RELEASE;  Surgeon: Nicki Reaper, MD;  Location: [facility];  Service: Orthopedics;   Laterality: Right;  . Breath tek h pylori N/A 08/13/2012    Procedure: BREATH TEK H PYLORI;  Surgeon: Mariella Saa, MD;  Location: [facility];  Service: General;  Laterality: N/A;  . Laparoscopy      due to endometriosis  . Trigger finger release Right 09/24/2012    Procedure: RELEASE A-1 PULLEY OF RIGHT THUMB;  Surgeon: Nicki Reaper, MD;  Location: [facility];  Service: Orthopedics;  Laterality: Right;  . Esophagogastroduodenoscopy  5/013/2011    GNF:AOZHYQ esophagus/multiple polyps removed s/p (hyperplastic). Gastritis without H.pylori.  . Carpal tunnel release right hand Right 12/13  . Colonoscopy with esophagogastroduodenoscopy (egd) N/A 10/28/2012    Procedure: COLONOSCOPY WITH ESOPHAGOGASTRODUODENOSCOPY (EGD);  Surgeon: Corbin Ade, MD;  [facility];  Service: Endoscopy;  Laterality: N/A;  7:30  . Givens capsule study N/A 10/28/2012    Procedure: GIVENS CAPSULE STUDY;  Surgeon: Corbin Ade, MD;  Location: [facility];  Service: Endoscopy;  Laterality: N/A;     CURRENT MEDICATIONS: has a current medication list which includes the following prescription(s): albuterol, amlodipine, budesonide-formoterol, divalproex, iron, insulin lispro, insulin nph-regular, lamotrigine, loratadine, metoprolol tartrate, multivitamin with minerals, omeprazole, valsartan-hydrochlorothiazide, venlafaxine xr, vitamin c, estradiol, and medroxyprogesterone.   ALLERGIES: Codeine; Prozac; Dyazide; Erythromycin; Lisinopril; Pravastatin; and Sulfonamide derivatives   SOCIAL HISTORY:  reports that she has never smoked. She has never used smokeless tobacco. She reports that she does not drink alcohol or use illicit drugs.   FAMILY HISTORY: family history includes Anesthesia problems in her mother and sister; COPD in her mother; Cancer in her mother; Heart disease in her mother; Hyperlipidemia in her father; Hypertension in her father; Thyroid  disease in her paternal uncle and  sister.    REVIEW OF SYSTEMS:  Other than that discussed above is noncontributory.    PHYSICAL EXAM:  height is 5\' 3"  (1.6 m) and weight is 239 lb (108.41 kg). Her oral temperature is 98.5 F (36.9 C). Her blood pressure is 138/82 and her pulse is 103. Her respiration is 20.    GENERAL:alert, no distress and comfortable SKIN: skin color, texture, turgor are normal, no rashes or significant lesions EYES: normal, Conjunctiva are pink and non-injected, sclera clear OROPHARYNX:no exudate, no erythema and lips, buccal mucosa, and tongue normal  NECK: supple, thyroid normal size, non-tender, without nodularity CHEST: Normal AP diameter with no breast masses. LYMPH:  no palpable lymphadenopathy in the cervical, axillary or inguinal LUNGS: clear to auscultation and percussion with normal breathing effort HEART: regular rate & rhythm and no murmurs ABDOMEN:abdomen soft, non-tender and normal bowel sounds MUSCULOSKELETALl:no cyanosis of digits, no clubbing or edema  NEURO: alert & oriented x 3 with fluent speech, no focal motor/sensory deficits    LABORATORY DATA:  Office Visit on 11/13/2012  Component Date Value Range Status  . WBC 11/13/2012 8.5  4.0 - 10.5 K/uL Final  . RBC 11/13/2012 5.05  3.87 - 5.11 MIL/uL Final  . Hemoglobin 11/13/2012 11.6* 12.0 - 15.0 g/dL Final  . HCT 16/10/9602 38.3  36.0 - 46.0 % Final  . MCV 11/13/2012 75.8* 78.0 - 100.0 fL Final  . MCH 11/13/2012 23.0* 26.0 - 34.0 pg Final  . MCHC 11/13/2012 30.3  30.0 - 36.0 g/dL Final  . RDW 54/09/8117 20.8* 11.5 - 15.5 % Final  . Platelets 11/13/2012 270  150 - 400 K/uL Final  . Neutrophils Relative % 11/13/2012 73  43 - 77 % Final  . Neutro Abs 11/13/2012 6.2  1.7 - 7.7 K/uL Final  . Lymphocytes Relative 11/13/2012 18  12 - 46 % Final  . Lymphs Abs 11/13/2012 1.6  0.7 - 4.0 K/uL Final  . Monocytes Relative 11/13/2012 8  3 - 12 % Final  . Monocytes Absolute 11/13/2012 0.7  0.1 - 1.0 K/uL Final  . Eosinophils Relative  11/13/2012 1  0 - 5 % Final  . Eosinophils Absolute 11/13/2012 0.1  0.0 - 0.7 K/uL Final  . Basophils Relative 11/13/2012 0  0 - 1 % Final  . Basophils Absolute 11/13/2012 0.0  0.0 - 0.1 K/uL Final  . Retic Ct Pct 11/13/2012 2.1  0.4 - 3.1 % Final  . RBC. 11/13/2012 5.05  3.87 - 5.11 MIL/uL Final  . Retic Count, Manual 11/13/2012 106.1  19.0 - 186.0 K/uL Final  . Sodium 11/13/2012 137  135 - 145 mEq/L Final  . Potassium 11/13/2012 3.9  3.5 - 5.1 mEq/L Final  . Chloride 11/13/2012 98  96 - 112 mEq/L Final  . CO2 11/13/2012 26  19 - 32 mEq/L Final  . Glucose, Bld 11/13/2012 110* 70 - 99 mg/dL Final  . BUN 14/78/2956 13  6 - 23 mg/dL Final  . Creatinine, Ser 11/13/2012 0.70  0.50 - 1.10 mg/dL Final  . Calcium 21/30/8657 9.3  8.4 - 10.5 mg/dL Final  . Total Protein 11/13/2012 7.3  6.0 - 8.3 g/dL Final  . Albumin 84/69/6295 3.8  3.5 - 5.2 g/dL Final  . AST 28/41/3244 18  0 - 37 U/L Final  . ALT 11/13/2012 15  0 - 35 U/L Final  . Alkaline Phosphatase 11/13/2012 90  39 - 117 U/L Final  . Total Bilirubin 11/13/2012 0.2*  0.3 - 1.2 mg/dL Final  . GFR calc non Af Amer 11/13/2012 >90  >90 mL/min Final  . GFR calc Af Amer 11/13/2012 >90  >90 mL/min Final   Comment: (NOTE)                          The eGFR has been calculated using the CKD EPI equation.                          This calculation has not been validated in all clinical situations.                          eGFR's persistently <90 mL/min signify possible Chronic Kidney                          Disease.  Admission on 10/28/2012, Discharged on 10/28/2012  Component Date Value Range Status  . Glucose-Capillary 10/28/2012 136* 70 - 99 mg/dL Final  . H Pylori IgG 96/04/5407 0.75   Final   Comment: (NOTE)                          No significant level of IgG antibody to H. pylori detected.                            ISR = Immune Status Ratio                                      <0.90         ISR       Negative                                       0.90 - 1.09   ISR       Equivocal                                      >=1.10        ISR       Positive                          The above results were obtained with the Immulite 2000 H. pylori IgG                          EIA.  Results obtained from other manufacturer's assay methods may not                          be used interchangeably.                          Performed at [facility]    Urinalysis No results found for this basename: colorurine,  appearanceur,  labspec,  phurine,  glucoseu,  hgbur,  bilirubinur,  ketonesur,  proteinur,  urobilinogen,  nitrite,  leukocytesur      @RADIOGRAPHY : Clinical Data: Screening recall for  a left breast mass.  DIGITAL DIAGNOSTIC RIGHT MAMMOGRAM AND RIGHT BREAST ULTRASOUND:  Comparison: Previous mammograms.  Findings:  ACR Breast Density Category a: The breast tissue is almost  entirely fatty.  There is a 5 mm circumscribed mass in the inferior medial right  breast, confirmed on the additional views.  Physical examination does not reveal any discrete palpable masses  or abnormalities at the site of concern in the right breast.  Targeted ultrasound demonstrates an oval well circumscribed  anechoic mass in the right breast at 3 o'clock 4 cm from the nipple  measuring 0.4 x 0.3 x 0.4 cm compatible with a simple cyst.  IMPRESSION:  Right breast simple cyst.  RECOMMENDATION:  Screening mammogram in one year. (Code:SM-B-01Y)  I have discussed the findings and recommendations with the patient.  Results were also provided in writing at the conclusion of the  visit. If applicable, a reminder letter will be sent to the  patient regarding the next appointment.  BI-RADS CATEGORY 2: Benign finding(s).  Original Report Authenticated By: Edwin Cap, M.D.    PATHOLOGY: None.   IMPRESSION:  #1. Iron deficiency anemia secondary to malabsorption probably due to proton pump inhibitor therapy versus anti-intrinsic factor antibody  or antiparietal cell antibody. #2. Morbid obesity, for gastric bypass surgery on 11/19/2012. #3. Obstructive sleep apnea syndrome. #4. Gastroesophageal reflux disease. #5. Diabetes mellitus, type II, non-insulin requiring. #6. History of nephrolithiasis   PLAN:  #1. Additional lab tests to clarify the diagnosis. #2Duncan Dull will be given at 1020 mg intravenously today if okayed by her third-party carrier. #3. No hematologic contraindication to surgery next week. #4. Followup in 4 weeks with CBC and ferritin.  I appreciate the opportunity of sharing in her care.   Maurilio Lovely, MD 11/13/2012 4:07 PM

## 2012-11-13 NOTE — Progress Notes (Signed)
Tolerated fereheme 1020 mg over one hour well.  On completion, IV D/C.

## 2012-11-14 ENCOUNTER — Encounter (HOSPITAL_COMMUNITY): Payer: Self-pay

## 2012-11-14 ENCOUNTER — Encounter (HOSPITAL_COMMUNITY)
Admission: RE | Admit: 2012-11-14 | Discharge: 2012-11-14 | Disposition: A | Discharge status: Home / Self Care | Payer: Medicare Other | Source: Ambulatory Visit | Attending: General Surgery | Admitting: General Surgery

## 2012-11-14 DIAGNOSIS — Z01812 Encounter for preprocedural laboratory examination: Secondary | ICD-10-CM | POA: Insufficient documentation

## 2012-11-14 HISTORY — DX: Polyneuropathy, unspecified: G62.9

## 2012-11-14 HISTORY — DX: Benign neoplasm of connective and other soft tissue, unspecified: D21.9

## 2012-11-14 LAB — VITAMIN B12: Vitamin B-12: 918 pg/mL — ABNORMAL HIGH (ref 211–911)

## 2012-11-14 LAB — FERRITIN: Ferritin: 17 ng/mL (ref 10–291)

## 2012-11-14 LAB — FOLATE RBC: RBC Folate: 1661 ng/mL — ABNORMAL HIGH (ref >=366)

## 2012-11-14 LAB — ERYTHROPOIETIN: Erythropoietin: 49 m[IU]/mL — ABNORMAL HIGH (ref 2.6–18.5)

## 2012-11-14 NOTE — Patient Instructions (Signed)
YOUR SURGERY IS SCHEDULED AT [facility]  ON:   Tuesday  10/28  REPORT TO [place] SHORT STAY CENTER AT:  9:45 AM      PHONE # FOR SHORT STAY IS [phone]  DO NOT EAT OR DRINK ANYTHING AFTER MIDNIGHT THE NIGHT BEFORE YOUR SURGERY.  YOU MAY BRUSH YOUR TEETH, RINSE OUT YOUR MOUTH--BUT NO WATER, NO FOOD, NO CHEWING GUM, NO MINTS, NO CANDIES, NO CHEWING TOBACCO.  PLEASE TAKE THE FOLLOWING MEDICATIONS THE AM OF YOUR SURGERY WITH A FEW SIPS OF WATER:   METOPROLOL, AMLODIPINE, OMEPRAZOLE.  USE YOUR ALBUTEROL AND SYMBICORT INHALERS.   BRING ALBUTEROL INHALER TO THE HOSPITAL.    IF YOU ARE DIABETIC:  DO NOT TAKE ANY DIABETIC MEDICATIONS THE AM OF YOUR SURGERY.  IF YOU TAKE INSULIN IN THE EVENINGS--PLEASE ONLY TAKE 1/2 NORMAL EVENING DOSE THE NIGHT BEFORE YOUR SURGERY.  NO INSULIN THE AM OF YOUR SURGERY.  IF YOU HAVE SLEEP APNEA AND USE CPAP OR BIPAP--PLEASE BRING THE MASK AND THE TUBING.  DO NOT BRING YOUR MACHINE.  DO NOT BRING VALUABLES, MONEY, CREDIT CARDS.  DO NOT WEAR JEWELRY, MAKE-UP, NAIL POLISH AND NO METAL PINS OR CLIPS IN YOUR HAIR. CONTACT LENS, DENTURES / PARTIALS, GLASSES SHOULD NOT BE WORN TO SURGERY AND IN MOST CASES-HEARING AIDS WILL NEED TO BE REMOVED.  BRING YOUR GLASSES CASE, ANY EQUIPMENT NEEDED FOR YOUR CONTACT LENS. FOR PATIENTS ADMITTED TO THE HOSPITAL--CHECK OUT TIME THE DAY OF DISCHARGE IS 11:00 AM.  ALL INPATIENT ROOMS ARE PRIVATE - WITH BATHROOM, TELEPHONE, TELEVISION AND WIFI INTERNET.                                  FAILURE TO FOLLOW THESE INSTRUCTIONS MAY RESULT IN THE CANCELLATION OF YOUR SURGERY.   PATIENT SIGNATURE_________________________________

## 2012-11-14 NOTE — Pre-Procedure Instructions (Signed)
PT HAS CBC, DIFF, CMET REPORTS IN EPIC - DONE 11/13/12 AT [facility]. EKG AND CXR REPORTS ARE IN EPIC - DONE 07/31/12.

## 2012-11-15 LAB — INTRINSIC FACTOR ANTIBODIES: Intrinsic Factor: NEGATIVE

## 2012-11-19 ENCOUNTER — Inpatient Hospital Stay (HOSPITAL_COMMUNITY)
Admission: RE | Admit: 2012-11-19 | Discharge: 2012-11-21 | DRG: 621 | Disposition: A | Discharge status: Home / Self Care | Payer: Medicare Other | Source: Ambulatory Visit | Attending: General Surgery | Admitting: General Surgery

## 2012-11-19 ENCOUNTER — Encounter (HOSPITAL_COMMUNITY)
Admission: RE | Disposition: A | Discharge status: Home / Self Care | Payer: Self-pay | Source: Ambulatory Visit | Attending: General Surgery

## 2012-11-19 ENCOUNTER — Encounter (HOSPITAL_COMMUNITY): Payer: Self-pay

## 2012-11-19 ENCOUNTER — Inpatient Hospital Stay (HOSPITAL_COMMUNITY): Payer: Medicare Other | Admitting: Anesthesiology

## 2012-11-19 ENCOUNTER — Encounter (HOSPITAL_COMMUNITY): Payer: Medicare Other | Admitting: Anesthesiology

## 2012-11-19 DIAGNOSIS — K219 Gastro-esophageal reflux disease without esophagitis: Secondary | ICD-10-CM | POA: Diagnosis present

## 2012-11-19 DIAGNOSIS — E119 Type 2 diabetes mellitus without complications: Secondary | ICD-10-CM | POA: Diagnosis present

## 2012-11-19 DIAGNOSIS — G4730 Sleep apnea, unspecified: Secondary | ICD-10-CM | POA: Diagnosis present

## 2012-11-19 DIAGNOSIS — F319 Bipolar disorder, unspecified: Secondary | ICD-10-CM | POA: Diagnosis present

## 2012-11-19 DIAGNOSIS — Z9884 Bariatric surgery status: Secondary | ICD-10-CM | POA: Diagnosis not present

## 2012-11-19 DIAGNOSIS — D509 Iron deficiency anemia, unspecified: Secondary | ICD-10-CM | POA: Diagnosis present

## 2012-11-19 DIAGNOSIS — Z79899 Other long term (current) drug therapy: Secondary | ICD-10-CM

## 2012-11-19 DIAGNOSIS — F411 Generalized anxiety disorder: Secondary | ICD-10-CM | POA: Diagnosis present

## 2012-11-19 DIAGNOSIS — G43909 Migraine, unspecified, not intractable, without status migrainosus: Secondary | ICD-10-CM | POA: Diagnosis present

## 2012-11-19 DIAGNOSIS — Z6841 Body Mass Index (BMI) 40.0 and over, adult: Secondary | ICD-10-CM

## 2012-11-19 DIAGNOSIS — Z794 Long term (current) use of insulin: Secondary | ICD-10-CM

## 2012-11-19 DIAGNOSIS — J45909 Unspecified asthma, uncomplicated: Secondary | ICD-10-CM | POA: Diagnosis present

## 2012-11-19 DIAGNOSIS — I10 Essential (primary) hypertension: Secondary | ICD-10-CM | POA: Diagnosis not present

## 2012-11-19 HISTORY — PX: GASTRIC ROUX-EN-Y: SHX5262

## 2012-11-19 LAB — GLUCOSE, CAPILLARY
Glucose-Capillary: 135 mg/dL — ABNORMAL HIGH (ref 70–99)
Glucose-Capillary: 161 mg/dL — ABNORMAL HIGH (ref 70–99)
Glucose-Capillary: 202 mg/dL — ABNORMAL HIGH (ref 70–99)
Glucose-Capillary: 229 mg/dL — ABNORMAL HIGH (ref 70–99)

## 2012-11-19 LAB — HEMOGLOBIN AND HEMATOCRIT, BLOOD
HCT: 41.5 % (ref 36.0–46.0)
Hemoglobin: 12.9 g/dL (ref 12.0–15.0)

## 2012-11-19 LAB — PREGNANCY, URINE: Preg Test, Ur: NEGATIVE

## 2012-11-19 SURGERY — LAPAROSCOPIC ROUX-EN-Y GASTRIC
Anesthesia: General | Site: Abdomen | Wound class: Clean Contaminated

## 2012-11-19 MED ORDER — FENTANYL CITRATE 0.05 MG/ML IJ SOLN
INTRAMUSCULAR | Status: DC | PRN
  Administered 2012-11-19 (×2): 50 ug via INTRAVENOUS
  Administered 2012-11-19: 100 ug via INTRAVENOUS
  Administered 2012-11-19 (×3): 50 ug via INTRAVENOUS

## 2012-11-19 MED ORDER — HEPARIN SODIUM (PORCINE) 5000 UNIT/ML IJ SOLN
5000.0000 [IU] | INTRAMUSCULAR | Status: AC
  Administered 2012-11-19: 5000 [IU] via SUBCUTANEOUS
  Filled 2012-11-19: qty 1

## 2012-11-19 MED ORDER — LACTATED RINGERS IR SOLN
Status: DC | PRN
  Administered 2012-11-19: 1000 mL

## 2012-11-19 MED ORDER — PROMETHAZINE HCL 25 MG/ML IJ SOLN
INTRAMUSCULAR | Status: AC
  Filled 2012-11-19: qty 1

## 2012-11-19 MED ORDER — 0.9 % SODIUM CHLORIDE (POUR BTL) OPTIME
TOPICAL | Status: DC | PRN
  Administered 2012-11-19: 1000 mL

## 2012-11-19 MED ORDER — KETOROLAC TROMETHAMINE 30 MG/ML IJ SOLN: 15.0000 mg | Freq: Once | INTRAMUSCULAR | Status: DC | PRN

## 2012-11-19 MED ORDER — INSULIN ASPART 100 UNIT/ML ~~LOC~~ SOLN
SUBCUTANEOUS | Status: AC
  Filled 2012-11-19: qty 1

## 2012-11-19 MED ORDER — HYDROMORPHONE HCL PF 1 MG/ML IJ SOLN
0.2500 mg | INTRAMUSCULAR | Status: DC | PRN
  Administered 2012-11-19: 0.25 mg via INTRAVENOUS

## 2012-11-19 MED ORDER — UNJURY CHOCOLATE CLASSIC POWDER: 2.0000 [oz_av] | Freq: Four times a day (QID) | ORAL | Status: DC

## 2012-11-19 MED ORDER — PROMETHAZINE HCL 25 MG/ML IJ SOLN
6.2500 mg | INTRAMUSCULAR | Status: AC | PRN
  Administered 2012-11-19 (×2): 6.25 mg via INTRAVENOUS

## 2012-11-19 MED ORDER — LACTATED RINGERS IV SOLN
INTRAVENOUS | Status: DC
  Administered 2012-11-19: 17:00:00 via INTRAVENOUS

## 2012-11-19 MED ORDER — GLYCOPYRROLATE 0.2 MG/ML IJ SOLN
INTRAMUSCULAR | Status: DC | PRN
  Administered 2012-11-19: .6 mg via INTRAVENOUS

## 2012-11-19 MED ORDER — LACTATED RINGERS IV SOLN
INTRAVENOUS | Status: DC | PRN
  Administered 2012-11-19 (×2): via INTRAVENOUS

## 2012-11-19 MED ORDER — DEXTROSE 5 % IV SOLN
INTRAVENOUS | Status: AC
  Filled 2012-11-19 (×2): qty 1

## 2012-11-19 MED ORDER — LACTATED RINGERS IV SOLN: INTRAVENOUS | Status: DC

## 2012-11-19 MED ORDER — HYDROMORPHONE HCL PF 1 MG/ML IJ SOLN
INTRAMUSCULAR | Status: AC
  Filled 2012-11-19: qty 1

## 2012-11-19 MED ORDER — UNJURY VANILLA POWDER: 2.0000 [oz_av] | Freq: Four times a day (QID) | ORAL | Status: DC

## 2012-11-19 MED ORDER — ONDANSETRON HCL 4 MG/2ML IJ SOLN
INTRAMUSCULAR | Status: DC | PRN
  Administered 2012-11-19: 4 mg via INTRAVENOUS

## 2012-11-19 MED ORDER — TISSEEL VH 10 ML EX KIT
PACK | CUTANEOUS | Status: DC | PRN
  Administered 2012-11-19: 10 mL

## 2012-11-19 MED ORDER — SCOPOLAMINE 1 MG/3DAYS TD PT72
MEDICATED_PATCH | TRANSDERMAL | Status: DC | PRN
  Administered 2012-11-19: 1 via TRANSDERMAL

## 2012-11-19 MED ORDER — BUPIVACAINE-EPINEPHRINE PF 0.5-1:200000 % IJ SOLN
INTRAMUSCULAR | Status: AC
  Filled 2012-11-19: qty 30

## 2012-11-19 MED ORDER — BUPIVACAINE-EPINEPHRINE 0.25% -1:200000 IJ SOLN
INTRAMUSCULAR | Status: DC | PRN
  Administered 2012-11-19: 25 mL

## 2012-11-19 MED ORDER — CISATRACURIUM BESYLATE (PF) 10 MG/5ML IV SOLN
INTRAVENOUS | Status: DC | PRN
  Administered 2012-11-19: 4 mg via INTRAVENOUS
  Administered 2012-11-19 (×2): 6 mg via INTRAVENOUS
  Administered 2012-11-19: 8 mg via INTRAVENOUS
  Administered 2012-11-19: 6 mg via INTRAVENOUS

## 2012-11-19 MED ORDER — SODIUM CHLORIDE 0.9 % IJ SOLN
INTRAMUSCULAR | Status: AC
  Filled 2012-11-19: qty 10

## 2012-11-19 MED ORDER — OXYCODONE-ACETAMINOPHEN 5-325 MG/5ML PO SOLN
5.0000 mL | ORAL | Status: DC | PRN
  Administered 2012-11-21: 10 mL via ORAL
  Filled 2012-11-19: qty 10

## 2012-11-19 MED ORDER — UNJURY CHICKEN SOUP POWDER
2.0000 [oz_av] | Freq: Four times a day (QID) | ORAL | Status: DC
  Administered 2012-11-21: 2 [oz_av] via ORAL

## 2012-11-19 MED ORDER — TISSEEL VH 10 ML EX KIT
PACK | CUTANEOUS | Status: AC
  Filled 2012-11-19: qty 1

## 2012-11-19 MED ORDER — HYDROMORPHONE HCL PF 1 MG/ML IJ SOLN
INTRAMUSCULAR | Status: AC
  Administered 2012-11-19: 1 mg
  Filled 2012-11-19: qty 1

## 2012-11-19 MED ORDER — POTASSIUM CHLORIDE IN NACL 20-0.9 MEQ/L-% IV SOLN
INTRAVENOUS | Status: DC
  Administered 2012-11-19 – 2012-11-21 (×3): via INTRAVENOUS
  Filled 2012-11-19 (×6): qty 1000

## 2012-11-19 MED ORDER — METOPROLOL TARTRATE 1 MG/ML IV SOLN
5.0000 mg | Freq: Four times a day (QID) | INTRAVENOUS | Status: DC
  Administered 2012-11-19 – 2012-11-21 (×8): 5 mg via INTRAVENOUS
  Filled 2012-11-19 (×12): qty 5

## 2012-11-19 MED ORDER — ACETAMINOPHEN 160 MG/5ML PO SOLN: 650.0000 mg | ORAL | Status: DC | PRN

## 2012-11-19 MED ORDER — SUCCINYLCHOLINE CHLORIDE 20 MG/ML IJ SOLN
INTRAMUSCULAR | Status: DC | PRN
  Administered 2012-11-19: 100 mg via INTRAVENOUS

## 2012-11-19 MED ORDER — INSULIN ASPART 100 UNIT/ML ~~LOC~~ SOLN
0.0000 [IU] | SUBCUTANEOUS | Status: DC
  Administered 2012-11-19: 5 [IU] via SUBCUTANEOUS
  Administered 2012-11-19: 3 [IU] via SUBCUTANEOUS
  Administered 2012-11-19: 5 [IU] via SUBCUTANEOUS
  Administered 2012-11-20 – 2012-11-21 (×8): 2 [IU] via SUBCUTANEOUS

## 2012-11-19 MED ORDER — INSULIN GLARGINE 100 UNIT/ML ~~LOC~~ SOLN
20.0000 [IU] | Freq: Every day | SUBCUTANEOUS | Status: DC
  Administered 2012-11-19 – 2012-11-20 (×2): 20 [IU] via SUBCUTANEOUS
  Filled 2012-11-19 (×3): qty 0.2

## 2012-11-19 MED ORDER — DEXAMETHASONE SODIUM PHOSPHATE 10 MG/ML IJ SOLN
INTRAMUSCULAR | Status: DC | PRN
  Administered 2012-11-19: 10 mg via INTRAVENOUS

## 2012-11-19 MED ORDER — BUDESONIDE-FORMOTEROL FUMARATE 160-4.5 MCG/ACT IN AERO
2.0000 | INHALATION_SPRAY | Freq: Two times a day (BID) | RESPIRATORY_TRACT | Status: DC
  Administered 2012-11-19 – 2012-11-21 (×4): 2 via RESPIRATORY_TRACT
  Filled 2012-11-19: qty 6

## 2012-11-19 MED ORDER — ONDANSETRON HCL 4 MG/2ML IJ SOLN
4.0000 mg | INTRAMUSCULAR | Status: DC | PRN
  Administered 2012-11-19 – 2012-11-21 (×6): 4 mg via INTRAVENOUS
  Filled 2012-11-19 (×6): qty 2

## 2012-11-19 MED ORDER — MIDAZOLAM HCL 5 MG/5ML IJ SOLN
INTRAMUSCULAR | Status: DC | PRN
  Administered 2012-11-19: 2 mg via INTRAVENOUS

## 2012-11-19 MED ORDER — PROPOFOL 10 MG/ML IV BOLUS
INTRAVENOUS | Status: DC | PRN
  Administered 2012-11-19: 200 mg via INTRAVENOUS

## 2012-11-19 MED ORDER — MORPHINE SULFATE 2 MG/ML IJ SOLN
2.0000 mg | INTRAMUSCULAR | Status: DC | PRN
  Administered 2012-11-19 – 2012-11-20 (×3): 2 mg via INTRAVENOUS
  Administered 2012-11-20 – 2012-11-21 (×5): 4 mg via INTRAVENOUS
  Filled 2012-11-19 (×2): qty 2
  Filled 2012-11-19: qty 1
  Filled 2012-11-19: qty 2
  Filled 2012-11-19 (×2): qty 1
  Filled 2012-11-19 (×2): qty 2

## 2012-11-19 MED ORDER — DEXTROSE 5 % IV SOLN
2.0000 g | INTRAVENOUS | Status: AC
  Administered 2012-11-19: 2 g via INTRAVENOUS

## 2012-11-19 MED ORDER — NEOSTIGMINE METHYLSULFATE 1 MG/ML IJ SOLN
INTRAMUSCULAR | Status: DC | PRN
  Administered 2012-11-19: 4 mg via INTRAVENOUS

## 2012-11-19 MED ORDER — SCOPOLAMINE 1 MG/3DAYS TD PT72
MEDICATED_PATCH | TRANSDERMAL | Status: AC
  Filled 2012-11-19: qty 1

## 2012-11-19 MED ORDER — HEPARIN SODIUM (PORCINE) 5000 UNIT/ML IJ SOLN
5000.0000 [IU] | Freq: Three times a day (TID) | INTRAMUSCULAR | Status: DC
  Administered 2012-11-20 – 2012-11-21 (×4): 5000 [IU] via SUBCUTANEOUS
  Filled 2012-11-19 (×8): qty 1

## 2012-11-19 SURGICAL SUPPLY — 86 items
ADH SKN CLS APL DERMABOND .7 (GAUZE/BANDAGES/DRESSINGS) ×1
APL SRG 32X5 SNPLK LF DISP (MISCELLANEOUS) ×1
APPLICATOR COTTON TIP 6IN STRL (MISCELLANEOUS) ×2 IMPLANT
APPLIER CLIP ROT 13.4 12 LRG (CLIP)
APR CLP LRG 13.4X12 ROT 20 MLT (CLIP)
BAG SPEC RTRVL LRG 6X4 10 (ENDOMECHANICALS)
BLADE SURG 15 STRL LF DISP TIS (BLADE) IMPLANT
BLADE SURG 15 STRL SS (BLADE)
BLADE SURG SZ11 CARB STEEL (BLADE) ×2 IMPLANT
CABLE HIGH FREQUENCY MONO STRZ (ELECTRODE) ×2 IMPLANT
CANISTER SUCTION 2500CC (MISCELLANEOUS) ×2 IMPLANT
CHLORAPREP W/TINT 26ML (MISCELLANEOUS) ×3 IMPLANT
CLIP APPLIE ROT 13.4 12 LRG (CLIP) IMPLANT
CLIP SUT LAPRA TY ABSORB (SUTURE) ×4 IMPLANT
CLOTH BEACON ORANGE TIMEOUT ST (SAFETY) ×1 IMPLANT
CUTTER LINEAR ENDO ART 45 ETS (STAPLE) ×2 IMPLANT
DECANTER SPIKE VIAL GLASS SM (MISCELLANEOUS) ×1 IMPLANT
DERMABOND ADVANCED (GAUZE/BANDAGES/DRESSINGS) ×1
DERMABOND ADVANCED .7 DNX12 (GAUZE/BANDAGES/DRESSINGS) ×1 IMPLANT
DEVICE SUTURE ENDOST 10MM (ENDOMECHANICALS) ×2 IMPLANT
DISSECTOR BLUNT TIP ENDO 5MM (MISCELLANEOUS) IMPLANT
DRAIN PENROSE 18X1/4 LTX STRL (WOUND CARE) ×2 IMPLANT
DRAPE CAMERA CLOSED 9X96 (DRAPES) ×2 IMPLANT
ELECT REM PT RETURN 9FT ADLT (ELECTROSURGICAL) ×2
ELECTRODE REM PT RTRN 9FT ADLT (ELECTROSURGICAL) ×1 IMPLANT
GAUZE SPONGE 4X4 16PLY XRAY LF (GAUZE/BANDAGES/DRESSINGS) ×2 IMPLANT
GLOVE BIOGEL PI IND STRL 7.0 (GLOVE) IMPLANT
GLOVE BIOGEL PI IND STRL 8 (GLOVE) IMPLANT
GLOVE BIOGEL PI INDICATOR 7.0 (GLOVE) ×3
GLOVE BIOGEL PI INDICATOR 8 (GLOVE) ×1
GLOVE ECLIPSE 7.0 STRL STRAW (GLOVE) ×1 IMPLANT
GLOVE SURG ORTHO 8.0 STRL STRW (GLOVE) ×1 IMPLANT
GLOVE SURG SIGNA 7.5 PF LTX (GLOVE) ×1 IMPLANT
GOWN STRL REIN 2XL LVL4 (GOWN DISPOSABLE) ×1 IMPLANT
GOWN STRL REIN XL XLG (GOWN DISPOSABLE) ×8 IMPLANT
HEMOSTAT SURGICEL 4X8 (HEMOSTASIS) IMPLANT
HOVERMATT SINGLE USE (MISCELLANEOUS) ×2 IMPLANT
KIT BASIN OR (CUSTOM PROCEDURE TRAY) ×2 IMPLANT
KIT GASTRIC LAVAGE 34FR ADT (SET/KITS/TRAYS/PACK) ×2 IMPLANT
MARKER SKIN DUAL TIP RULER LAB (MISCELLANEOUS) ×2 IMPLANT
NDL SPNL 22GX3.5 QUINCKE BK (NEEDLE) ×1 IMPLANT
NEEDLE SPNL 22GX3.5 QUINCKE BK (NEEDLE) ×2 IMPLANT
NS IRRIG 1000ML POUR BTL (IV SOLUTION) ×2 IMPLANT
PACK CARDIOVASCULAR III (CUSTOM PROCEDURE TRAY) ×2 IMPLANT
POUCH SPECIMEN RETRIEVAL 10MM (ENDOMECHANICALS) IMPLANT
RELOAD 45 VASCULAR/THIN (ENDOMECHANICALS) ×2 IMPLANT
RELOAD BLUE (STAPLE) ×5 IMPLANT
RELOAD ENDO STITCH 2.0 (ENDOMECHANICALS) ×20
RELOAD GOLD (STAPLE) ×2 IMPLANT
RELOAD STAPLE 45 2.5 WHT GRN (ENDOMECHANICALS) ×2 IMPLANT
RELOAD STAPLE 45 3.5 BLU ETS (ENDOMECHANICALS) ×1 IMPLANT
RELOAD STAPLE TA45 3.5 REG BLU (ENDOMECHANICALS) ×2 IMPLANT
RELOAD SUT SNGL STCH ABSRB 2-0 (ENDOMECHANICALS) ×5 IMPLANT
RELOAD SUT SNGL STCH BLK 2-0 (ENDOMECHANICALS) ×3 IMPLANT
RELOAD WHITE ECR60W (STAPLE) ×1 IMPLANT
SCALPEL HARMONIC ACE (MISCELLANEOUS) ×1 IMPLANT
SCISSORS LAP 5X35 DISP (ENDOMECHANICALS) ×2 IMPLANT
SEALANT SURGICAL APPL DUAL CAN (MISCELLANEOUS) ×2 IMPLANT
SET IRRIG TUBING LAPAROSCOPIC (IRRIGATION / IRRIGATOR) ×2 IMPLANT
SHEARS CURVED HARMONIC AC 45CM (MISCELLANEOUS) ×1 IMPLANT
SLEEVE ADV FIXATION 12X100MM (TROCAR) ×4 IMPLANT
SLEEVE XCEL OPT CAN 5 100 (ENDOMECHANICALS) ×1 IMPLANT
SOLUTION ANTI FOG 6CC (MISCELLANEOUS) ×2 IMPLANT
SPONGE GAUZE 4X4 12PLY (GAUZE/BANDAGES/DRESSINGS) ×1 IMPLANT
STAPLE ECHEON FLEX 60 POW ENDO (STAPLE) ×2 IMPLANT
STAPLER VISISTAT 35W (STAPLE) ×2 IMPLANT
SUT ETHILON 3 0 PS 1 (SUTURE) IMPLANT
SUT MNCRL AB 4-0 PS2 18 (SUTURE) ×2 IMPLANT
SUT RELOAD ENDO STITCH 2 48X1 (ENDOMECHANICALS) ×5
SUT RELOAD ENDO STITCH 2.0 (ENDOMECHANICALS) ×5
SUT VIC AB 2-0 SH 27 (SUTURE) ×4
SUT VIC AB 2-0 SH 27X BRD (SUTURE) ×1 IMPLANT
SUTURE RELOAD END STTCH 2 48X1 (ENDOMECHANICALS) ×5 IMPLANT
SUTURE RELOAD ENDO STITCH 2.0 (ENDOMECHANICALS) ×5 IMPLANT
SYR 20CC LL (SYRINGE) ×4 IMPLANT
SYR 50ML LL SCALE MARK (SYRINGE) ×1 IMPLANT
TOWEL OR 17X26 10 PK STRL BLUE (TOWEL DISPOSABLE) ×2 IMPLANT
TOWEL OR NON WOVEN STRL DISP B (DISPOSABLE) ×2 IMPLANT
TRAY FOLEY CATH 14FRSI W/METER (CATHETERS) ×2 IMPLANT
TROCAR ADV FIXATION 12X100MM (TROCAR) ×2 IMPLANT
TROCAR ADV FIXATION 5X100MM (TROCAR) ×1 IMPLANT
TROCAR BLADELESS OPT 5 100 (ENDOMECHANICALS) ×2 IMPLANT
TROCAR XCEL 12X100 BLDLESS (ENDOMECHANICALS) ×2 IMPLANT
TUBING ENDO SMARTCAP (MISCELLANEOUS) ×2 IMPLANT
TUBING FILTER THERMOFLATOR (ELECTROSURGICAL) ×2 IMPLANT
WATER STERILE IRR 1500ML POUR (IV SOLUTION) ×1 IMPLANT

## 2012-11-19 NOTE — H&P (View-Only) (Signed)
Chief complaint: Preop visit for gastric bypass  History: The patient returns for a preop visit prior to planned laparoscopic Roux-en-Y gastric bypass for multiple comorbidities as below. She has successfully completed her preoperative workup. There were no concerns of slight nutrition evaluation. Ultrasound was unremarkable. Lab work did however show a significant iron deficiency anemia with a hemoglobin of 9.4. She underwent a thorough GI workup including upper and lower endoscopy which were entirely negative and small bowel capsule endoscopy that showed a tiny insignificant small bowel erosion. She is on iron therapy and may require iron infusion. We discussed that her bypass surgery will potentially further interfere with iron absorption and she may require iron infusions postoperatively. However I think the benefit of significant weight loss would outweigh his disadvantaged and she is in agreement. She currently is feeling well.  Past Medical History  Diagnosis Date  . Hyperlipidemia     no current med.  . Obesity   . Gastroesophageal reflux disease   . Anxiety   . Depression   . H/O hiatal hernia   . PONV (postoperative nausea and vomiting)   . Migraines   . Asthma     daily and prn inhalers  . Anemia   . Shortness of breath     with daily activities  . Degenerative joint disease     right knee, spine  . Lock jaw     jaw locks open if opens mouth wide  . Bipolar disorder   . Sleep apnea     uses CPAP nightly  . History of endometriosis   . Insulin dependent diabetes mellitus   . Hypertension     under control with med., has been on med. x 2 yr.  . Palpitations   . Dental crowns present   . Family history of anesthesia complication     pt's mother and sister have hx. of post-op N/V  . Trigger thumb of right hand 08/2012   Past Surgical History  Procedure Laterality Date  . Tubal ligation  1993  . Pelvic laparoscopy  11/04/1999    with fulguration of endometriosis  .  Colonoscopy  05/2009    WUJ:[id number] hemorrhoids otherwise normal colon, rectum and terminal ileum  . Carpal tunnel release  01/03/2012    Procedure: CARPAL TUNNEL RELEASE;  Surgeon: Nicki Reaper, MD;  Location: [facility];  Service: Orthopedics;  Laterality: Right;  . Breath tek h pylori N/A 08/13/2012    Procedure: BREATH TEK H PYLORI;  Surgeon: Mariella Saa, MD;  Location: [facility];  Service: General;  Laterality: N/A;  . Laparoscopy      due to endometriosis  . Trigger finger release Right 09/24/2012    Procedure: RELEASE A-1 PULLEY OF RIGHT THUMB;  Surgeon: Nicki Reaper, MD;  Location: [facility];  Service: Orthopedics;  Laterality: Right;  . Esophagogastroduodenoscopy  5/013/2011    GNF:AOZHYQ esophagus/multiple polyps removed s/p (hyperplastic). Gastritis without H.pylori.  . Carpal tunnel release right hand Right 12/13  . Colonoscopy with esophagogastroduodenoscopy (egd) N/A 10/28/2012    Procedure: COLONOSCOPY WITH ESOPHAGOGASTRODUODENOSCOPY (EGD);  Surgeon: Corbin Ade, MD;  Location: [facility];  Service: Endoscopy;  Laterality: N/A;  7:30  . Givens capsule study N/A 10/28/2012    Procedure: GIVENS CAPSULE STUDY;  Surgeon: Corbin Ade, MD;  Location: [facility];  Service: Endoscopy;  Laterality: N/A;   Current Outpatient Prescriptions  Medication Sig Dispense Refill  . albuterol (PROVENTIL HFA;VENTOLIN HFA) 108 (90  BASE) MCG/ACT inhaler Inhale 2 puffs into the lungs every 6 (six) hours as needed for wheezing.       Marland Kitchen amLODipine (NORVASC) 5 MG tablet Take 5 mg by mouth daily.      . budesonide-formoterol (SYMBICORT) 160-4.5 MCG/ACT inhaler Inhale 2 puffs into the lungs 2 (two) times daily as needed.       . Canagliflozin (INVOKANA) 100 MG TABS Take 50 mg by mouth daily.      . divalproex (DEPAKOTE) 500 MG DR tablet Take 1,500-2,000 mg by mouth daily. Takes 1500 mg on day 1 then 2000 mg on day 2. Repeat      . estradiol (ESTRACE) 2 MG  tablet Take 1 tablet (2 mg total) by mouth daily.  30 tablet  11  . Ferrous Sulfate (IRON) 325 (65 FE) MG TABS Take 1 tablet by mouth daily.       . insulin lispro (HUMALOG PEN) 100 UNIT/ML injection Inject 10-20 Units into the skin 3 (three) times daily as needed for high blood sugar.       . insulin NPH-regular (NOVOLIN 70/30) (70-30) 100 UNIT/ML injection Inject 40-60 Units into the skin 2 (two) times daily.      Marland Kitchen lamoTRIgine (LAMICTAL) 100 MG tablet Take 100 mg by mouth daily.      Marland Kitchen loratadine (CLARITIN) 10 MG tablet Take 10 mg by mouth daily.      . medroxyPROGESTERone (PROVERA) 5 MG tablet Take 1 tablet (5 mg total) by mouth daily.  30 tablet  11  . metoprolol tartrate (LOPRESSOR) 25 MG tablet Take 1 tablet (25 mg total) by mouth 2 (two) times daily.  60 tablet  3  . omeprazole (PRILOSEC) 20 MG capsule Take 20 mg by mouth daily.      . valsartan-hydrochlorothiazide (DIOVAN-HCT) 320-25 MG per tablet Take 1 tablet by mouth daily.      Marland Kitchen venlafaxine (EFFEXOR) 75 MG tablet       . vitamin C (ASCORBIC ACID) 500 MG tablet Take 500 mg by mouth daily.       No current facility-administered medications for this visit.   Allergies  Allergen Reactions  . Codeine Nausea And Vomiting  . Prozac [Fluoxetine Hcl] Hives  . Dyazide [Hydrochlorothiazide W-Triamterene] Cough  . Erythromycin Nausea Only  . Lisinopril Cough  . Pravastatin Other (See Comments)    BODY ACHES, FLU-LIKE SX.  . Sulfonamide Derivatives Rash   Exam: BP 136/92  Pulse 88  Temp(Src) 98.9 F (37.2 C) (Temporal)  Resp 15  Ht 5\' 3"  (1.6 m)  Wt 244 lb (110.678 kg)  BMI 43.23 kg/m2 General: Morbidly obese but otherwise well-appearing Caucasian female Skin: No rash or infection Lungs: Clear equal breath sounds bilaterally Cardiac: Regular rate and rhythm. No murmurs. No edema. Abdomen: Soft and nontender. No masses or organomegaly. Neurologic: Affect normal. Alert and oriented. Gait normal.  Assessment and plan: Plan  laparoscopic Roux-en-Y gastric bypass for morbid obesity with multiple comorbidities. Iron Deficiency anemia with a thorough negative workup. We reviewed the procedure and risks and recovery again today. The consent form was reviewed line by line. She was given a bowel prep. All her questions were answered.

## 2012-11-19 NOTE — Progress Notes (Signed)
Placed nasal cpap on pt at home settings of 12cm h2o.  Within a few minutes, pt requested it off.  Pt stated she has gas pains and that pressure from cpap makes her feel worse.  Offered to lower pressure for comfort, but pt stated she would rather wear O2 in her nose tonight.  RN notified.  Placed pt back on 2lnc.  Pt remains on continuous pulseox.  HR100, sats97%.

## 2012-11-19 NOTE — Transfer of Care (Signed)
Immediate Anesthesia Transfer of Care Note  Patient: Carrie Cook  Procedure(s) Performed: Procedure(s): LAPAROSCOPIC ROUX-EN-Y GASTRIC (N/A)  Patient Location: PACU  Anesthesia Type:General  Level of Consciousness: awake, alert , oriented and patient cooperative  Airway & Oxygen Therapy: Patient Spontanous Breathing and Patient connected to face mask oxygen  Post-op Assessment: Report given to PACU RN and Post -op Vital signs reviewed and stable  Post vital signs: Reviewed and stable  Complications: No apparent anesthesia complications

## 2012-11-19 NOTE — Preoperative (Signed)
Beta Blockers   Reason not to administer Beta Blockers:Not Applicable 

## 2012-11-19 NOTE — Op Note (Signed)
Name:  Carrie Cook MRN: [id number] Date of Surgery: 11/19/2012  Preop Diagnosis:  Morbid Obesity, S/P RYGB  Postop Diagnosis:  Morbid Obesity, S/P RYGB  Procedure:  Upper endoscopy  (Intraoperative)  Surgeon:  Ovidio Kin, M.D.  Anesthesia:  GET  Indications for procedure: Carrie Cook is a 52 y.o. female whose primary care physician is LUKING,SCOTT, MD and has completed a Roux-en-Y gastric bypass today by Dr. Johna Sheriff.  I am doing an intraoperative upper endoscopy to evaluate the gastric pouch and the gastro-jejunal anastomosis.  Operative Note: The patient is under general anesthesia.  Dr. Johna Sheriff is laparoscoping the patient while I do an upper endoscopy to evaluate the stomach pouch and gastrojejunal anastomosis.  With the patient intubated, I passed the Pentax endoscope without difficulty down the esophagus.  The esophago-gastric junction was at 40 cm.  The gastro-jejunal anastomosis was at 45 cm.  The mucosa of the stomach looked viable and the staple line was intact without bleeding.  The gastro-jejunal anastomosis looked okay.  While I insufflated the stomach pouch with air, Dr. Johna Sheriff clamped off the efferent limb of the jejunum.  He then flooded the upper abdomen with saline to put the gastric pouch and gastro-jejunal anastomosis under saline.  There was no bubbling or evidence of a leak.  Photos were taken of the anastomosis.  The scope was then withdrawn.  The esophagus was unremarkable and the patient tolerated the endoscopy without difficulty.  Ovidio Kin, MD, [facility] Pager: [phone] Office phone:  [phone]

## 2012-11-19 NOTE — Progress Notes (Signed)
Dr. Okey Dupre in to see patient- aware of patient's vital signs.

## 2012-11-19 NOTE — Op Note (Signed)
Preop diagnosis: Morbid obesity  Postop diagnosis: Morbid obesity  Body mass index is 40.43 kg/(m^2).  Surgical procedure: Laparoscopic Roux-en-Y gastric bypass  Surgeon: Carrie Cook M.D.  Asst.: Ovidio Kin M.D.  Anesthesia: General  Complications:  None  EBL: Minimal  Drains: None  Disposition: PACU in good condition  Description of procedure: Patient is brought to the operating room and general anesthesia induced. She had received preoperative broad-spectrum IV antibiotics and subcutaneous heparin. The abdomen was widely sterilely prepped and draped. Patient timeout was performed and correct patient and procedure confirmed. Access was obtained with a 12 mm Optiview trocar in the left upper quadrant and pneumoperitoneum established without difficulty. Under direct vision 12 mm trocars were placed laterally in the right upper quadrant, right upper quadrant midclavicular line, and to the left and above the umbilicus for the camera port. A 5 mm trocar was placed laterally in the left upper quadrant. The omentum was brought into the upper abdomen and the transverse mesocolon elevated and the ligament of Treitz clearly identified. A 40 cm biliopancreatic limb was then carefully measured from the ligament of Treitz. The small intestine was divided at this point with a single firing of the white load linear stapler. A Penrose drain was sutured to the end of the Roux-en-Y limb for later identification. A 100 cm Roux-en-Y limb was then carefully measured. At this point a side-to-side anastomosis was created between the Roux limb and the end of the biliopancreatic limb. This was accomplished with a single firing of the 45 mm white load linear stapler. The common enterotomy was closed with a running 2-0 Vicryl begun at either end of the enterotomy and tied centrally. The mesenteric defect was then closed with running 2-0 silk. The omentum was then divided with the harmonic scalpel up towards  the transverse colon to allow mobility of the Roux limb toward the gastric pouch. The patient was then placed in steep reversed Trendelenburg. Through a 5 mm subxiphoid site the [facility] retractor was placed and the left lobe of the liver elevated with excellent exposure of the upper stomach and hiatus. The angle of Hiss was then mobilized with the harmonic scalpel. A 4 cm gastric pouch was then carefully measured along the lesser curve of the stomach. Dissection was carried along the lesser curve at this point with the Harmonic scalpel working carefully back toward the lesser sac at right angles to the lesser curve. The free lesser sac was then entered. After being sure all tubes were removed from the stomach an initial firing of the gold load 60 mm linear stapler was fired at right angles across the lesser curve for about 4 cm. The gastric pouch was further mobilized posteriorly and then the pouch was completed with 2 further firings of the 60 mm blue load linear stapler up through the previously dissected angle of His. It was ensured that the pouch was completely mobilized away from the gastric remnant. This created a nice tubular 4-5 cm gastric pouch. The staple line of the gastric remnant was then oversewn with 2-0 silk for hemostasis. The Roux limb was then brought up in an antecolic fashion with the candycane facing to the patient's left without undue tension. The gastrojejunostomy was created with an initial posterior row of 2-0 Vicryl between the Roux limb and the staple line of the gastric pouch. Enterotomies were then made in the gastric pouch and the Roux limb with the harmonic scalpel and at approximately 2-2-1/2 cm anastomosis was created with a single  firing of the blue load linear stapler. The staple line was inspected and was intact without bleeding. The common enterotomy was then closed with running 2-0 Vicryl begun at either end and tied centrally. The wall tube was then easily passed through the  anastomosis and an outer anterior layer of running 2-0 Vicryl was placed. The Ewald tube was removed. With the outlet of the gastrojejunostomy clamped and under saline irrigation the assistant performed upper endoscopy and with the gastric pouch tensely distended with air there was no evidence of leak. The pouch was desufflated. The Vonita Moss defect was closed with running 2-0 silk. The abdomen was inspected for any evidence of bleeding or bowel injury and everything looked fine. The Nathanson retractor was removed under direct vision after coating the anastomosis with Tisseel tissue sealant. All CO2 was evacuated and trochars removed. Skin incisions were closed with staples. Sponge needle and instrument counts were correct. The patient was taken to the PACU in good condition.     Carrie Saa MD, FACS  11/19/2012, 2:17 PM

## 2012-11-19 NOTE — Anesthesia Preprocedure Evaluation (Signed)
Anesthesia Evaluation  Patient identified by MRN, date of birth, ID band Patient awake    Reviewed: Allergy & Precautions, H&P , NPO status , Patient's Chart, lab work & pertinent test results  History of Anesthesia Complications (+) PONV  Airway Mallampati: III TM Distance: <3 FB Neck ROM: Full    Dental no notable dental hx.    Pulmonary asthma , sleep apnea ,  breath sounds clear to auscultation  + decreased breath sounds      Cardiovascular hypertension, Rhythm:Regular Rate:Normal     Neuro/Psych negative neurological ROS  negative psych ROS   GI/Hepatic Neg liver ROS, GERD-  ,  Endo/Other  diabetes, Insulin DependentMorbid obesity  Renal/GU negative Renal ROS  negative genitourinary   Musculoskeletal negative musculoskeletal ROS (+)   Abdominal   Peds negative pediatric ROS (+)  Hematology negative hematology ROS (+)   Anesthesia Other Findings   Reproductive/Obstetrics negative OB ROS                           Anesthesia Physical Anesthesia Plan  ASA: III  Anesthesia Plan: General   Post-op Pain Management:    Induction: Intravenous  Airway Management Planned: Oral ETT  Additional Equipment:   Intra-op Plan:   Post-operative Plan: Extubation in OR  Informed Consent: I have reviewed the patients History and Physical, chart, labs and discussed the procedure including the risks, benefits and alternatives for the proposed anesthesia with the patient or authorized representative who has indicated his/her understanding and acceptance.   Dental advisory given  Plan Discussed with: CRNA and Surgeon  Anesthesia Plan Comments:         Anesthesia Quick Evaluation

## 2012-11-19 NOTE — Anesthesia Postprocedure Evaluation (Signed)
  Anesthesia Post-op Note  Patient: Carrie Cook  Procedure(s) Performed: Procedure(s) (LRB): LAPAROSCOPIC ROUX-EN-Y GASTRIC (N/A)  Patient Location: PACU  Anesthesia Type: General  Level of Consciousness: awake and alert   Airway and Oxygen Therapy: Patient Spontanous Breathing  Post-op Pain: mild  Post-op Assessment: Post-op Vital signs reviewed, Patient's Cardiovascular Status Stable, Respiratory Function Stable, Patent Airway and No signs of Nausea or vomiting  Last Vitals:  Filed Vitals:   11/19/12 1445  BP: 171/87  Pulse: 107  Temp:   Resp: 24    Post-op Vital Signs: stable   Complications: No apparent anesthesia complications

## 2012-11-19 NOTE — Interval H&P Note (Signed)
History and Physical Interval Note:  11/19/2012 10:44 AM  Carrie Cook  has presented today for surgery, with the diagnosis of morbid obesity   The various methods of treatment have been discussed with the patient and family. After consideration of risks, benefits and other options for treatment, the patient has consented to  Procedure(s): LAPAROSCOPIC ROUX-EN-Y GASTRIC (N/A) as a surgical intervention .  The patient's history has been reviewed, patient examined, no change in status, stable for surgery.  I have reviewed the patient's chart and labs.  Questions were answered to the patient's satisfaction.     Crickett Abbett T

## 2012-11-19 NOTE — Progress Notes (Signed)
Dr. Okey Dupre notified of patient's hear rates averaging 104-107 and also of blood pressures

## 2012-11-19 NOTE — Progress Notes (Signed)
Patient concerned because stool isn't clear but is liquid brown. Stated she had mango-pineapple juice at 2130  YESTERDAY BECAUSE OF HYPOGLYCEMIA. Juice is clear w/o pulp

## 2012-11-19 NOTE — Progress Notes (Signed)
Insulin coverage begun after talking with Dr. Okey Dupre about patient's CBG RESULTS OF 229 in PACU.

## 2012-11-20 ENCOUNTER — Encounter (HOSPITAL_COMMUNITY): Payer: Self-pay | Admitting: General Surgery

## 2012-11-20 ENCOUNTER — Inpatient Hospital Stay (HOSPITAL_COMMUNITY): Payer: Medicare Other

## 2012-11-20 LAB — CBC WITH DIFFERENTIAL/PLATELET
Basophils Absolute: 0 10*3/uL (ref 0.0–0.1)
Basophils Relative: 0 % (ref 0–1)
Eosinophils Absolute: 0 10*3/uL (ref 0.0–0.7)
Eosinophils Relative: 0 % (ref 0–5)
HCT: 40.6 % (ref 36.0–46.0)
Hemoglobin: 12.5 g/dL (ref 12.0–15.0)
Lymphocytes Relative: 10 % — ABNORMAL LOW (ref 12–46)
Lymphs Abs: 1.2 10*3/uL (ref 0.7–4.0)
MCH: 23.8 pg — ABNORMAL LOW (ref 26.0–34.0)
MCHC: 30.8 g/dL (ref 30.0–36.0)
MCV: 77.2 fL — ABNORMAL LOW (ref 78.0–100.0)
Monocytes Absolute: 1.3 10*3/uL — ABNORMAL HIGH (ref 0.1–1.0)
Monocytes Relative: 11 % (ref 3–12)
Neutro Abs: 9.5 10*3/uL — ABNORMAL HIGH (ref 1.7–7.7)
Neutrophils Relative %: 79 % — ABNORMAL HIGH (ref 43–77)
Platelets: 259 10*3/uL (ref 150–400)
RBC: 5.26 MIL/uL — ABNORMAL HIGH (ref 3.87–5.11)
RDW: 22.3 % — ABNORMAL HIGH (ref 11.5–15.5)
WBC: 12.1 10*3/uL — ABNORMAL HIGH (ref 4.0–10.5)

## 2012-11-20 LAB — COMPREHENSIVE METABOLIC PANEL
ALT: 134 U/L — ABNORMAL HIGH (ref 0–35)
AST: 133 U/L — ABNORMAL HIGH (ref 0–37)
Albumin: 3.6 g/dL (ref 3.5–5.2)
Alkaline Phosphatase: 86 U/L (ref 39–117)
BUN: 7 mg/dL (ref 6–23)
CO2: 25 mEq/L (ref 19–32)
Calcium: 9.2 mg/dL (ref 8.4–10.5)
Chloride: 101 mEq/L (ref 96–112)
Creatinine, Ser: 0.64 mg/dL (ref 0.50–1.10)
GFR calc Af Amer: >90 mL/min (ref >90)
GFR calc non Af Amer: >90 mL/min (ref >90)
Glucose, Bld: 144 mg/dL — ABNORMAL HIGH (ref 70–99)
Potassium: 4.2 mEq/L (ref 3.5–5.1)
Sodium: 137 mEq/L (ref 135–145)
Total Bilirubin: 0.3 mg/dL (ref 0.3–1.2)
Total Protein: 6.7 g/dL (ref 6.0–8.3)

## 2012-11-20 LAB — GLUCOSE, CAPILLARY
Glucose-Capillary: 136 mg/dL — ABNORMAL HIGH (ref 70–99)
Glucose-Capillary: 123 mg/dL — ABNORMAL HIGH (ref 70–99)
Glucose-Capillary: 131 mg/dL — ABNORMAL HIGH (ref 70–99)
Glucose-Capillary: 131 mg/dL — ABNORMAL HIGH (ref 70–99)
Glucose-Capillary: 142 mg/dL — ABNORMAL HIGH (ref 70–99)

## 2012-11-20 LAB — HEMOGLOBIN AND HEMATOCRIT, BLOOD
HCT: 40.1 % (ref 36.0–46.0)
Hemoglobin: 12.5 g/dL (ref 12.0–15.0)

## 2012-11-20 MED ORDER — IOHEXOL 300 MG/ML  SOLN
50.0000 mL | Freq: Once | INTRAMUSCULAR | Status: AC | PRN
  Administered 2012-11-20: 50 mL via ORAL

## 2012-11-20 NOTE — Progress Notes (Signed)
Patient voiced concerns that CPAP pressure placed too much into her belly and causes pain. Therefore, she did not tolerate her home settings of 12cmH20 last night. Education provided to the patient for other methods of ensuring she can attempt the use of CPAP and have increased comfort with settings. She agrees to attempt different settings. Placed on auto titration min 4 max 12. VSS at this time. Humidifier filled to max fill line with sterile water. She is utilizing her home nasal pillows and circuit with a hospital supplied machine. She is aware that RT is available in the night if she should need further assistance.

## 2012-11-20 NOTE — Progress Notes (Signed)
Utilization review completed.  

## 2012-11-20 NOTE — Progress Notes (Signed)
Patient ID: Carrie Cook, female   DOB: 10-Sep-1960, 52 y.o.   MRN: [id number] 1 Day Post-Op  Subjective: Some nausea yesterday that is resolved. Some upper abdominal pain but well controlled with medications. Feels better than yesterday. Has been up walking. No shortness of breath or other complaints.  Objective: Vital signs in last 24 hours: Temp:  [97.6 F (36.4 C)-99 F (37.2 C)] 98.2 F (36.8 C) (10/29 0500) Pulse Rate:  [69-116] 69 (10/29 0500) Resp:  [18-25] 18 (10/29 0500) BP: (128-192)/(70-103) 172/103 mmHg (10/29 0500) SpO2:  [90 %-100 %] 93 % (10/29 0825) Weight:  [228 lb 3.2 oz (103.511 kg)] 228 lb 3.2 oz (103.511 kg) (10/28 0933) Last BM Date: 11/19/12  Intake/Output from previous day: 10/28 0701 - 10/29 0700 In: 2690 [I.V.:2690] Out: 900 [Urine:825; Blood:75] Intake/Output this shift:    General appearance: alert, cooperative and no distress Resp: clear to auscultation bilaterally GI: abnormal findings:  mild tenderness in the epigastrium Incision/Wound: clean and dry without evidence of infection  Lab Results:   Recent Labs  11/19/12 2010 11/20/12 0501  WBC  --  12.1*  HGB 12.9 12.5  HCT 41.5 40.6  PLT  --  259   BMET  Recent Labs  11/20/12 0501  NA 137  K 4.2  CL 101  CO2 25  GLUCOSE 144*  BUN 7  CREATININE 0.64  CALCIUM 9.2   CBG (last 3)   Recent Labs  11/19/12 2347 11/20/12 0402 11/20/12 0820  GLUCAP 161* 136* 123*      Studies/Results: No results found.  Anti-infectives: Anti-infectives   Start     Dose/Rate Route Frequency Ordered Stop   11/19/12 0934  cefOXitin (MEFOXIN) 2 g in dextrose 5 % 50 mL IVPB     2 g 100 mL/hr over 30 Minutes Intravenous On call to O.R. 11/19/12 0934 11/19/12 1129      Assessment/Plan: s/p Procedure(s): LAPAROSCOPIC ROUX-EN-Y GASTRIC Stable postoperatively. Gastrografin swallow this morning.   LOS: 1 day    Burnell Matlin T 11/20/2012

## 2012-11-20 NOTE — Care Management Note (Signed)
    Page 1 of 1   11/20/2012     10:09:36 AM   CARE MANAGEMENT NOTE 11/20/2012  Patient:  Carrie Cook, Carrie Cook   Account Number:  [id number]  Date Initiated:  11/20/2012  Documentation initiated by:  Lorenda Ishihara  Subjective/Objective Assessment:   52 yo female admitted s/p lap gastric bypass. PTA lived at home with spouse.     Action/Plan:   Home when stable   Anticipated DC Date:  11/22/2012   Anticipated DC Plan:  HOME/SELF CARE      DC Planning Services  CM consult      Choice offered to / List presented to:             Status of service:  Completed, signed off Medicare Important Message given?   (If response is "NO", the following Medicare IM given date fields will be blank) Date Medicare IM given:   Date Additional Medicare IM given:    Discharge Disposition:  HOME/SELF CARE  Per UR Regulation:  Reviewed for med. necessity/level of care/duration of stay  If discussed at Long Length of Stay Meetings, dates discussed:    Comments:

## 2012-11-21 LAB — CBC WITH DIFFERENTIAL/PLATELET
Basophils Absolute: 0 10*3/uL (ref 0.0–0.1)
Basophils Relative: 0 % (ref 0–1)
Eosinophils Absolute: 0 10*3/uL (ref 0.0–0.7)
Eosinophils Relative: 0 % (ref 0–5)
HCT: 40.5 % (ref 36.0–46.0)
Hemoglobin: 12.7 g/dL (ref 12.0–15.0)
Lymphocytes Relative: 13 % (ref 12–46)
Lymphs Abs: 1.7 10*3/uL (ref 0.7–4.0)
MCH: 24.5 pg — ABNORMAL LOW (ref 26.0–34.0)
MCHC: 31.4 g/dL (ref 30.0–36.0)
MCV: 78 fL (ref 78.0–100.0)
Monocytes Absolute: 1.3 10*3/uL — ABNORMAL HIGH (ref 0.1–1.0)
Monocytes Relative: 10 % (ref 3–12)
Neutro Abs: 10.1 10*3/uL — ABNORMAL HIGH (ref 1.7–7.7)
Neutrophils Relative %: 77 % (ref 43–77)
Platelets: 217 10*3/uL (ref 150–400)
RBC: 5.19 MIL/uL — ABNORMAL HIGH (ref 3.87–5.11)
RDW: 22.8 % — ABNORMAL HIGH (ref 11.5–15.5)
WBC: 13.1 10*3/uL — ABNORMAL HIGH (ref 4.0–10.5)

## 2012-11-21 LAB — GLUCOSE, CAPILLARY
Glucose-Capillary: 138 mg/dL — ABNORMAL HIGH (ref 70–99)
Glucose-Capillary: 146 mg/dL — ABNORMAL HIGH (ref 70–99)
Glucose-Capillary: 147 mg/dL — ABNORMAL HIGH (ref 70–99)

## 2012-11-21 MED ORDER — INSULIN NPH ISOPHANE & REGULAR (70-30) 100 UNIT/ML ~~LOC~~ SUSP: 10.0000 [IU] | Freq: Two times a day (BID) | SUBCUTANEOUS | Status: DC

## 2012-11-21 MED ORDER — OXYCODONE HCL 5 MG/5ML PO SOLN: 5.0000 mg | ORAL | Status: DC | PRN

## 2012-11-21 NOTE — Progress Notes (Signed)
Patient alert and oriented, pain is controlled. Patient is tolerating fluids, plan to advance to protein shake today.  Reviewed Gastric Bypass discharge instructions with patient and patient is able to articulate understanding. Provided information on BELT program, Support Groups, Pharmacy offerings, all questions answered.  GASTRIC BYPASS / SLEEVE  Home Care Instructions  These instructions are to help you care for yourself when you go home.  Call: If you have any problems.   Call [phone] and ask for the surgeon on call   If you need immediate assistance come to the ER at [facility]. Tell the ER staff that you are a new post-op gastric bypass or gastric sleeve patient   Signs and symptoms to report:   Severe vomiting or nausea o If you cannot handle clear liquids for longer than 1 day, call your surgeon    Abdominal pain which does not get better after taking your pain medication   Fever greater than 100.4 F and chills   Heart rate over 100 beats a minute   Trouble breathing   Chest pain    Redness, swelling, drainage, or foul odor at incision (surgical) sites    If your incisions open or pull apart   Swelling or pain in calf (lower leg)   Diarrhea (Loose bowel movements that happen often), frequent watery, uncontrolled bowel movements   Constipation, (no bowel movements for 3 days) if this happens:  o Take Milk of Magnesia, 2 tablespoons by mouth, 3 times a day for 2 days if needed o Stop taking Milk of Magnesia once you have had a bowel movement o Call your doctor if constipation continues Or o Take Miralax  (instead of Milk of Magnesia) following the label instructions o Stop taking Miralax once you have had a bowel movement o Call your doctor if constipation continues   Anything you think is "abnormal for you"   Normal side effects after surgery:   Unable to sleep at night or unable to concentrate   Irritability   Being tearful (crying) or depressed These are common  complaints, possibly related to your anesthesia, stress of surgery and change in lifestyle, that usually go away a few weeks after surgery.  If these feelings continue, call your medical doctor.  Wound Care: You may have surgical glue, steri-strips, or staples over your incisions after surgery   Surgical glue:  Looks like a clear film over your incisions and will wear off a little at a time   Steri-strips : Adhesive strips of tape over your incisions. You may notice a yellowish color on the skin under the steri-strips. This is used to make the   steri-strips stick better. Do not pull the steri-strips off - let them fall off   Staples: Staples may be removed before you leave the hospital o If you go home with staples, call [facility] at for an appointment with your surgeon's nurse to have staples removed 10 days after surgery, [phone]   Showering: You may shower two (2) days after your surgery unless your surgeon tells you differently o Wash gently around incisions with warm soapy water, rinse well, and gently pat dry  o If you have a drain (tube from your incision), you may need someone to hold this while you shower  o No tub baths until staples are removed and incisions are healed     Medications:   Medications should be liquid or crushed if larger than the size of a dime  Extended release pills (medication that releases a little bit at a time through the day) should not be crushed   Depending on the size and number of medications you take, you may need to space (take a few throughout the day)/change the time you take your medications so that you do not over-fill your pouch (smaller stomach)   Make sure you follow-up with your primary care physician to make medication changes needed during rapid weight loss and life-style changes   If you have diabetes, follow up with the doctor that orders your diabetes medication(s) within one week after surgery and check your blood sugar  regularly.   Do not drive while taking narcotics (pain medications)   Do not take acetaminophen (Tylenol) and Roxicet or Lortab Elixir at the same time since these pain medications contain acetaminophen  Diet:                    First 2 Weeks  You will see the nutritionist about two (2) weeks after your surgery. The nutritionist will increase the types of foods you can eat if you are handling liquids well:   If you have severe vomiting or nausea and cannot handle clear liquids lasting longer than 1 day, call your surgeon  Protein Shake   Drink at least 2 ounces of shake 5-6 times per day   Each serving of protein shakes (usually 8 - 12 ounces) should have a minimum of:  o 15 grams of protein  o And no more than 5 grams of carbohydrate    Goal for protein each day: o Men = 80 grams per day o Women = 60 grams per day   Protein powder may be added to fluids such as non-fat milk or Lactaid milk or Soy milk (limit to 35 grams added protein powder per serving)  Hydration   Slowly increase the amount of water and other clear liquids as tolerated (See Acceptable Fluids)   Slowly increase the amount of protein shake as tolerated     Sip fluids slowly and throughout the day   May use sugar substitutes in small amounts (no more than 6 - 8 packets per day; i.e. Splenda)  Fluid Goal   The first goal is to drink at least 8 ounces of protein shake/drink per day (or as directed by the nutritionist); some examples of protein shakes are [employer], [employer]illard[employer], EAS Edge HP, and Unjury. See handout from pre-op Bariatric Education Class: o Slowly increase the amount of protein shake you drink as tolerated o You may find it easier to slowly sip shakes throughout the day o It is important to get your proteins in first   Your fluid goal is to drink 64 - 100 ounces of fluid daily o It may take a few weeks to build up to this   32 oz (or more) should be clear liquids  And    32 oz (or more) should be  full liquids (see below for examples)   Liquids should not contain sugar, caffeine, or carbonation  Clear Liquids:   Water or Sugar-free flavored water (i.e. Fruit H2O, Propel)   Decaffeinated coffee or tea (sugar-free)   Crystal Lite, Wyler's Lite, Minute Maid Lite   Sugar-free Jell-O   Bouillon or broth   Sugar-free Popsicle:   *Less than 20 calories each; Limit 1 per day  Full Liquids: Protein Shakes/Drinks + 2 choices per day of other full liquids   Full liquids must be: o  No More Than 12 grams of Carbs per serving  o No More Than 3 grams of Fat per serving   Strained low-fat cream soup   Non-Fat milk   Fat-free Lactaid Milk   Sugar-free yogurt (Dannon Lite & Fit, Greek yogurt)      Vitamins and Minerals   Start 1 day after surgery unless otherwise directed by your surgeon   2 Chewable Multivitamin / Multimineral Supplement with iron (i.e. Centrum for Adults)   Vitamin B-12, 350 - 500 micrograms sub-lingual (place tablet under the tongue) each day   Chewable Calcium Citrate with Vitamin D-3 (Example: 3 Chewable Calcium Plus 600 with Vitamin D-3) o Take 500 mg three (3) times a day for a total of 1500 mg each day o Do not take all 3 doses of calcium at one time as it may cause constipation, and you can only absorb 500 mg  at a time  o Do not mix multivitamins containing iron with calcium supplements; take 2 hours apart o Do not substitute Tums (calcium carbonate) for your calcium   Menstruating women and those at risk for anemia (a blood disease that causes weakness) may need extra iron o Talk with your doctor to see if you need more iron   If you need extra iron: Total daily Iron recommendation (including Vitamins) is 50 to 100 mg Iron/day   Do not stop taking or change any vitamins or minerals until you talk to your nutritionist or surgeon   Your nutritionist and/or surgeon must approve all vitamin and mineral supplements   Activity and Exercise: It is important to  continue walking at home.  Limit your physical activity as instructed by your doctor.  During this time, use these guidelines:   Do not lift anything greater than ten (10) pounds for at least two (2) weeks   Do not go back to work or drive until Designer, industrial/product says you can   You may have sex when you feel comfortable  o It is VERY important for female patients to use a reliable birth control method; fertility often increases after surgery  o Do not get pregnant for at least 18 months   Start exercising as soon as your doctor tells you that you can o Make sure your doctor approves any physical activity   Start with a simple walking program   Walk 5-15 minutes each day, 7 days per week.    Slowly increase until you are walking 30-45 minutes per day Consider joining our BELT program. [phone] or email [email or web address]   Special Instructions Things to remember:   Free counseling is available for you and your family through collaboration between [facility] and [place]. Please call [phone] and leave a message   Use your CPAP when sleeping if this applies to you   [facility] has a free Bariatric Surgery Support Group that meets monthly, the 3rd Thursday, 6 pm, [facility] Classrooms You can see classes online at [email or web address]   It is very important to keep all follow up appointments with your surgeon, nutritionist, primary care physician, and behavioral health practitioner o After the first year, please follow up with your bariatric surgeon and nutritionist at least once a year in order to maintain best weight loss results [facility]: [phone] [facility]: [phone] Bariatric Nurse Coordinator: [phone]

## 2012-11-21 NOTE — Discharge Summary (Signed)
Patient ID: Carrie Cook [id number] 52 y.o. 1960/11/08  11/19/2012  Discharge date and time: 11/21/2012   Admitting Physician: Glenna Fellows T  Discharge Physician: Glenna Fellows T  Admission Diagnoses: morbid obesity   Discharge Diagnoses:same  Operations: Procedure(s): LAPAROSCOPIC ROUX-EN-Y GASTRIC bypass  Admission Condition: good  Discharged Condition: good  Indication for Admission: patient is a 52 year old female with progressive morbid obesity unresponsive to medical management and multiple comorbidities including insulin-dependent diabetes mellitus. Following an extensive preoperative evaluation and workup detailed elsewhere she is electively admitted for laparoscopic Roux-en-Y gastric bypass.  Hospital Course: the patient was admitted on the morning of her procedure. She underwent an uneventful laparoscopic gastric bypass. Her postoperative course was smooth. On the first postoperative day she had some mild expected pain and nausea. Gastrografin swallow showed no leak or obstruction and she was started on sips of water. On the second postoperative day she states she is feeling "fine". Denies abdominal pain or nausea. Abdomen is soft and nontender. Wounds are healing well. White blood count is slightly elevated at 13 and hemoglobin is stable. She clinically is doing very well. She'll be advanced to protein shakes today and discharged if tolerated well.    Disposition: Home  Patient Instructions:    Medication List    STOP taking these medications       divalproex 500 MG DR tablet  Commonly known as:  DEPAKOTE     estradiol 2 MG tablet  Commonly known as:  ESTRACE     medroxyPROGESTERone 5 MG tablet  Commonly known as:  PROVERA     vitamin C 500 MG tablet  Commonly known as:  ASCORBIC ACID      TAKE these medications       albuterol 108 (90 BASE) MCG/ACT inhaler  Commonly known as:  PROVENTIL HFA;VENTOLIN HFA  Inhale 2 puffs into the lungs  every 6 (six) hours as needed for wheezing.     amLODipine 5 MG tablet  Commonly known as:  NORVASC  Take 5 mg by mouth every morning.     budesonide-formoterol 160-4.5 MCG/ACT inhaler  Commonly known as:  SYMBICORT  Inhale 2 puffs into the lungs 2 (two) times daily.     HUMALOG PEN 100 UNIT/ML injection  Generic drug:  insulin lispro  Inject 10-20 Units into the skin 3 (three) times daily as needed for high blood sugar.     insulin NPH-regular (70-30) 100 UNIT/ML injection  Commonly known as:  NOVOLIN 70/30  Inject 10-15 Units into the skin 2 (two) times daily. 30 units in am, 25 units in pm     Iron 325 (65 FE) MG Tabs  Take 325 mg by mouth daily.     lamoTRIgine 100 MG tablet  Commonly known as:  LAMICTAL  Take 100 mg by mouth at bedtime.     loratadine 10 MG tablet  Commonly known as:  CLARITIN  Take 10 mg by mouth daily.     metoprolol tartrate 25 MG tablet  Commonly known as:  LOPRESSOR  Take 1 tablet (25 mg total) by mouth 2 (two) times daily.     multivitamin with minerals Tabs tablet  Take 1 tablet by mouth daily.     omeprazole 20 MG capsule  Commonly known as:  PRILOSEC  Take 20 mg by mouth daily.     oxyCODONE 5 MG/5ML solution  Commonly known as:  ROXICODONE  Take 5-10 mLs (5-10 mg total) by mouth every 4 (four) hours as needed for pain.  valsartan-hydrochlorothiazide 320-25 MG per tablet  Commonly known as:  DIOVAN-HCT  Take 1 tablet by mouth every morning.     venlafaxine XR 75 MG 24 hr capsule  Commonly known as:  EFFEXOR-XR  Take 75 mg by mouth every evening.        Activity: activity as tolerated Diet: bariatric protein shakes Wound Care: none needed  Follow-up:  With Dr. Johna Sheriff in 3 weeks.  Signed: Mariella Saa MD, FACS  11/21/2012, 8:24 AM

## 2012-11-21 NOTE — Progress Notes (Signed)
Pt noted with nausea and vomiting after receiving roxicet liquid pain medication.  Pt has allergy to codeine that caused nausea and vomiting.  Pt stated that she feels she will be all right at home taking just liquid tylenol.  MD notified. MD stated that it is ok for pt to be discharged just taking liquid tylenol at home for pain.

## 2012-11-22 LAB — GLUCOSE, CAPILLARY: Glucose-Capillary: 157 mg/dL — ABNORMAL HIGH (ref 70–99)

## 2012-11-28 ENCOUNTER — Other Ambulatory Visit: Payer: Self-pay

## 2012-12-03 ENCOUNTER — Encounter: Payer: Medicare Other | Attending: General Surgery | Admitting: Dietician

## 2012-12-03 DIAGNOSIS — Z713 Dietary counseling and surveillance: Secondary | ICD-10-CM | POA: Insufficient documentation

## 2012-12-03 NOTE — Progress Notes (Signed)
Bariatric Class:  Appt start time: 1530 end time:  1630.  2 Week Post-Operative Nutrition Class  Patient was seen on 12/03/12 for Post-Operative [facility]   Surgery date: 10/21/12 Surgery type: RYGB Start weight at [facility]: 233.0 lbs (08/12/12) Weight today: 215 lbs Weight lost since last visit: 25 lbs  TANITA  BODY COMP RESULTS  09/26/12 12/03/12   BMI (kg/m^2) 42.5 38.2   Fat Mass (lbs) 111.5 98.5   Fat Free Mass (lbs) 128.5 117.0   Total Body Water (lbs) 94.0 85.5    The following the learning objectives were met by the patient during this course:  Identifies Phase 3A (Soft, High Proteins) Dietary Goals and will begin from 2 weeks post-operatively to 2 months post-operatively  Identifies appropriate sources of fluids and proteins   States protein recommendations and appropriate sources post-operatively  Identifies the need for appropriate texture modifications, mastication, and bite sizes when consuming solids  Identifies appropriate multivitamin and calcium sources post-operatively  Describes the need for physical activity post-operatively and will follow MD recommendations  States when to call healthcare provider regarding medication questions or post-operative complications  Handouts given during class include:  Phase 3A: Soft, High Protein Diet Handout  Follow-Up Plan: Patient will follow-up at [facility] in 6 weeks for 8 week post-op nutrition visit for diet advancement per MD.

## 2012-12-03 NOTE — Patient Instructions (Signed)
Goals:  Follow Phase 3A: Soft High Protein Phase  Eat 3-6 small meals/snacks, every 3-5 hrs  Increase lean protein foods to meet 60g goal  Increase fluid intake to 64oz +  Avoid drinking 15 minutes before, during and 30 minutes after eating  Aim for >30 min of physical activity daily per MD 

## 2012-12-06 ENCOUNTER — Encounter (INDEPENDENT_AMBULATORY_CARE_PROVIDER_SITE_OTHER): Payer: Self-pay | Admitting: General Surgery

## 2012-12-06 ENCOUNTER — Ambulatory Visit (INDEPENDENT_AMBULATORY_CARE_PROVIDER_SITE_OTHER): Payer: Medicare Other | Admitting: General Surgery

## 2012-12-06 VITALS — BP 126/86 | HR 71 | Temp 97.4°F | Resp 16 | Ht 63.0 in | Wt 213.0 lb

## 2012-12-06 DIAGNOSIS — Z09 Encounter for follow-up examination after completed treatment for conditions other than malignant neoplasm: Secondary | ICD-10-CM

## 2012-12-06 NOTE — Progress Notes (Signed)
History: This returns for her first postoperative visit just over 2 weeks following laparoscopic Roux-en-Y gastric bypass. She is getting along very well. She is tolerating a liquid diet without any difficulty. She did try her for solid foods a couple of days ago and felt some pressure and pain. Bowel movements are okay. Her initial level is improving and she is driving and active and taking no pain medications. Her insulin requirement is down to one half of the preoperative dose.  Exam: BP 126/86  Pulse 71  Temp(Src) 97.4 F (36.3 C) (Temporal)  Resp 16  Ht 5\' 3"  (1.6 m)  Wt 213 lb (96.616 kg)  BMI 37.74 kg/m2  LMP 09/20/2011 Weight loss 31 pounds from surgery General: Appears well in no distress Abdomen: Soft and nontender and incisions all healing well  Assessment and plan: Doing very well following laparoscopic Roux-en-Y gastric bypass with no complications identified. I don't think she is quite ready to tackle solid food and I told her to wait for another week and continue protein shakes and then again slowly begin solid food. Return in 3 weeks.

## 2012-12-09 ENCOUNTER — Encounter (HOSPITAL_COMMUNITY): Payer: Medicare Other | Attending: Hematology and Oncology

## 2012-12-09 DIAGNOSIS — D509 Iron deficiency anemia, unspecified: Secondary | ICD-10-CM

## 2012-12-09 LAB — CBC WITH DIFFERENTIAL/PLATELET
Basophils Absolute: 0 10*3/uL (ref 0.0–0.1)
Basophils Relative: 0 % (ref 0–1)
Eosinophils Absolute: 0.1 10*3/uL (ref 0.0–0.7)
Eosinophils Relative: 2 % (ref 0–5)
HCT: 45.4 % (ref 36.0–46.0)
Hemoglobin: 14.7 g/dL (ref 12.0–15.0)
Lymphocytes Relative: 20 % (ref 12–46)
Lymphs Abs: 1.5 10*3/uL (ref 0.7–4.0)
MCH: 26.4 pg (ref 26.0–34.0)
MCHC: 32.4 g/dL (ref 30.0–36.0)
MCV: 81.7 fL (ref 78.0–100.0)
Monocytes Absolute: 0.6 10*3/uL (ref 0.1–1.0)
Monocytes Relative: 7 % (ref 3–12)
Neutro Abs: 5.6 10*3/uL (ref 1.7–7.7)
Neutrophils Relative %: 71 % (ref 43–77)
Platelets: 272 10*3/uL (ref 150–400)
RBC: 5.56 MIL/uL — ABNORMAL HIGH (ref 3.87–5.11)
RDW: 21 % — ABNORMAL HIGH (ref 11.5–15.5)
WBC: 7.8 10*3/uL (ref 4.0–10.5)

## 2012-12-09 LAB — FERRITIN: Ferritin: 186 ng/mL (ref 10–291)

## 2012-12-09 NOTE — Progress Notes (Signed)
Labs drawn today for cbc/diff,ferr 

## 2012-12-10 ENCOUNTER — Encounter: Payer: Self-pay | Admitting: Family Medicine

## 2012-12-10 ENCOUNTER — Ambulatory Visit (INDEPENDENT_AMBULATORY_CARE_PROVIDER_SITE_OTHER): Payer: Medicare Other | Admitting: Family Medicine

## 2012-12-10 VITALS — BP 124/88 | Ht 63.0 in | Wt 212.6 lb

## 2012-12-10 DIAGNOSIS — I10 Essential (primary) hypertension: Secondary | ICD-10-CM

## 2012-12-10 DIAGNOSIS — Z23 Encounter for immunization: Secondary | ICD-10-CM | POA: Diagnosis not present

## 2012-12-10 DIAGNOSIS — E1165 Type 2 diabetes mellitus with hyperglycemia: Secondary | ICD-10-CM

## 2012-12-10 NOTE — Progress Notes (Signed)
  Subjective:    Patient ID: Carrie Cook, female    DOB: 1961-01-09, 52 y.o.   MRN: [id number]  HPI Patient is here today to go over medications since she had the gastric by-pass surgery on 10/28.  She would also like the flu vaccine. She is following dietitian's advice with the T. she is also watching her sugars her lungs are clear   Review of Systems She denies any chest tightness pressure pain denies vomiting diarrhea she states she's starting get along well    Objective:   Physical Exam  Her lungs are clear hearts regular abdomen soft extremities no edema      Assessment & Plan:  Gastric bypass surgery that she will probably continue to lose weight will need to be on less medicines I. warned her about hypoglycemia we may need to reduce medications as times forward she ought to followup again in 3 months time no need for any testing today

## 2012-12-11 ENCOUNTER — Encounter (HOSPITAL_BASED_OUTPATIENT_CLINIC_OR_DEPARTMENT_OTHER): Payer: Medicare Other

## 2012-12-11 ENCOUNTER — Encounter (HOSPITAL_COMMUNITY): Payer: Self-pay

## 2012-12-11 ENCOUNTER — Telehealth: Payer: Self-pay | Admitting: Cardiovascular Disease

## 2012-12-11 VITALS — BP 113/73 | HR 81 | Temp 98.5°F | Resp 16 | Wt 211.3 lb

## 2012-12-11 DIAGNOSIS — D509 Iron deficiency anemia, unspecified: Secondary | ICD-10-CM

## 2012-12-11 NOTE — Progress Notes (Signed)
[facility]  OFFICE PROGRESS NOTE  Lilyan Punt, MD [address] [place] [address]  DIAGNOSIS: Iron deficiency anemia, unspecified - Plan: CBC with Differential, Ferritin, CBC with Differential, Ferritin  Chief Complaint  Patient presents with  . Anemia    iron def, s/p Feraheme 11/13/2012    CURRENT THERAPY: Feraheme 1020 mg on 11/13/2012. Underwent gastric bypass surgery on 11/19/2012  INTERVAL HISTORY: Carrie Cook 52 y.o. female returns for followup of iron deficiency anemia, status post iron infusion on 11/13/2012. Has noticed improvement in blood sugar as well as need for CPAP. She does suffer with easy satiety but has had no episodes of diarrhea. Certain foods still irritated her stomach and she is gradually advancing her diet to tolerance. She denies any fever, night sweats, vomiting, lower extremity swelling or redness, melena, hematochezia, hematuria, vaginal bleeding, epistaxis, or hemoptysis.   MEDICAL HISTORY: Past Medical History  Diagnosis Date  . Hyperlipidemia     no current med.  . Obesity   . Gastroesophageal reflux disease   . Anxiety   . H/O hiatal hernia   . PONV (postoperative nausea and vomiting)   . Migraines   . Asthma     daily and prn inhalers  . Shortness of breath     with daily activities  . Lock jaw     jaw locks open if opens mouth wide  . History of endometriosis   . Insulin dependent diabetes mellitus   . Hypertension     under control with med., has been on med. x 2 yr.  . Palpitations   . Dental crowns present   . Family history of anesthesia complication     pt's mother and sister have hx. of post-op N/V  . Depression   . Sleep apnea     uses CPAP nightly SETTING IS 12  . Fibroids     UTERINE  . Neuropathy     FEET  . Degenerative joint disease     right knee, spine - STATES INTERMITTENT  NUMBNESS DOWN RT LEG WITH PROLONGED STANDING OR WALKING- THINKS RELATED TO  HER SPINE PROBLEMS  . Anemia     PT HAS HAD COLONOSCOPY AND ENDOSCOPY WORK UP - NO PROBLEMS FOUND AS SOURCE OF ANEMIA -- PT HAD IRON INFUSION AT [facility] 11/13/12-AFTER SEEING HEMATOLOGIST DR. Kaprice Kage AND HE GAVE HEMATOLOGIC CLEARANCE FOR GASTRIC BYPASS SURGERY.    INTERIM HISTORY: has ANEMIA-IRON DEFICIENCY; GERD; Chest pain; Hyperlipidemia; Type II or unspecified type diabetes mellitus with unspecified complication, uncontrolled; Obesity; Laboratory test; Nephrolithiasis; Asthma; Endometriosis; Hypertension; Obstructive sleep apnea; and Morbid obesity on her problem list.    ALLERGIES:  is allergic to codeine; prozac; dyazide; erythromycin; lisinopril; pravastatin; and sulfonamide derivatives.  MEDICATIONS: has a current medication list which includes the following prescription(s): albuterol, amlodipine, calcium citrate-vitamin d, insulin nph-regular, lamotrigine, loratadine, metoprolol tartrate, multivitamin with minerals, venlafaxine xr, vitamin b-12, budesonide-formoterol, iron, and omeprazole.  SURGICAL HISTORY:  Past Surgical History  Procedure Laterality Date  . Tubal ligation  1993  . Pelvic laparoscopy  11/04/1999    with fulguration of endometriosis  . Colonoscopy  05/2009    WJX:[id number] hemorrhoids otherwise normal colon, rectum and terminal ileum  . Carpal tunnel release  01/03/2012    Procedure: CARPAL TUNNEL RELEASE;  Surgeon: Nicki Reaper, MD;  Location: [facility];  Service: Orthopedics;  Laterality: Right;  . Breath tek h pylori N/A 08/13/2012  Procedure: BREATH TEK H PYLORI;  Surgeon: Mariella Saa, MD;  Location: [facility];  Service: General;  Laterality: N/A;  . Laparoscopy      due to endometriosis  . Trigger finger release Right 09/24/2012    Procedure: RELEASE A-1 PULLEY OF RIGHT THUMB;  Surgeon: Nicki Reaper, MD;  Location: [facility];  Service: Orthopedics;  Laterality: Right;  . Esophagogastroduodenoscopy  5/013/2011     GNF:AOZHYQ esophagus/multiple polyps removed s/p (hyperplastic). Gastritis without H.pylori.  . Carpal tunnel release right hand Right 12/13  . Colonoscopy with esophagogastroduodenoscopy (egd) N/A 10/28/2012    Procedure: COLONOSCOPY WITH ESOPHAGOGASTRODUODENOSCOPY (EGD);  Surgeon: Corbin Ade, MD;  Location: [facility];  Service: Endoscopy;  Laterality: N/A;  7:30  . Givens capsule study N/A 10/28/2012    Procedure: GIVENS CAPSULE STUDY;  Surgeon: Corbin Ade, MD;  Location: [facility];  Service: Endoscopy;  Laterality: N/A;  . Gastric roux-en-y N/A 11/19/2012    Procedure: LAPAROSCOPIC ROUX-EN-Y GASTRIC;  Surgeon: Mariella Saa, MD;  Location: [facility];  Service: General;  Laterality: N/A;    FAMILY HISTORY: family history includes Anesthesia problems in her mother and sister; COPD in her mother; Cancer in her mother; Heart disease in her mother; Hyperlipidemia in her father; Hypertension in her father; Thyroid disease in her paternal uncle and sister.  SOCIAL HISTORY:  reports that she has never smoked. She has never used smokeless tobacco. She reports that she does not drink alcohol or use illicit drugs.  REVIEW OF SYSTEMS:  Other than that discussed above is noncontributory.  PHYSICAL EXAMINATION: ECOG PERFORMANCE STATUS: 1 - Symptomatic but completely ambulatory  Weight 211 lb 4.8 oz (95.845 kg), last menstrual period 09/20/2011.  GENERAL:alert, no distress and comfortable. Moderately obese. SKIN: skin color, texture, turgor are normal, no rashes or significant lesions EYES: PERLA; Conjunctiva are pink and non-injected, sclera clear OROPHARYNX:no exudate, no erythema on lips, buccal mucosa, or tongue. NECK: supple, thyroid normal size, non-tender, without nodularity. No masses CHEST: No breast masses. LYMPH:  no palpable lymphadenopathy in the cervical, axillary or inguinal LUNGS: clear to auscultation and percussion with normal breathing effort HEART: regular rate &  rhythm and no murmurs. ABDOMEN:abdomen soft, non-tender and normal bowel sounds MUSCULOSKELETAL:no cyanosis of digits and no clubbing. Range of motion normal.  NEURO: alert & oriented x 3 with fluent speech, no focal motor/sensory deficits   LABORATORY DATA: Infusion on 12/09/2012  Component Date Value Range Status  . WBC 12/09/2012 7.8  4.0 - 10.5 K/uL Final  . RBC 12/09/2012 5.56* 3.87 - 5.11 MIL/uL Final  . Hemoglobin 12/09/2012 14.7  12.0 - 15.0 g/dL Final  . HCT 65/78/4696 45.4  36.0 - 46.0 % Final  . MCV 12/09/2012 81.7  78.0 - 100.0 fL Final  . MCH 12/09/2012 26.4  26.0 - 34.0 pg Final  . MCHC 12/09/2012 32.4  30.0 - 36.0 g/dL Final  . RDW 29/52/8413 21.0* 11.5 - 15.5 % Final  . Platelets 12/09/2012 272  150 - 400 K/uL Final  . Neutrophils Relative % 12/09/2012 71  43 - 77 % Final  . Neutro Abs 12/09/2012 5.6  1.7 - 7.7 K/uL Final  . Lymphocytes Relative 12/09/2012 20  12 - 46 % Final  . Lymphs Abs 12/09/2012 1.5  0.7 - 4.0 K/uL Final  . Monocytes Relative 12/09/2012 7  3 - 12 % Final  . Monocytes Absolute 12/09/2012 0.6  0.1 - 1.0 K/uL Final  . Eosinophils Relative 12/09/2012  2  0 - 5 % Final  . Eosinophils Absolute 12/09/2012 0.1  0.0 - 0.7 K/uL Final  . Basophils Relative 12/09/2012 0  0 - 1 % Final  . Basophils Absolute 12/09/2012 0.0  0.0 - 0.1 K/uL Final  . Ferritin 12/09/2012 186  10 - 291 ng/mL Final   Performed at [facility]  Admission on 11/19/2012, Discharged on 11/21/2012  Component Date Value Range Status  . Preg Test, Ur 11/19/2012 NEGATIVE  NEGATIVE Final   Comment:                                 THE SENSITIVITY OF THIS                          METHODOLOGY IS >20 mIU/mL.  Marland Kitchen Glucose-Capillary 11/19/2012 135* 70 - 99 mg/dL Final  . Comment 1 16/10/9602 Documented in Chart   Final  . Glucose-Capillary 11/19/2012 229* 70 - 99 mg/dL Final  . Comment 1 54/09/8117 Documented in Chart   Final  . Comment 2 11/19/2012 Notify RN   Final  . Hemoglobin  11/19/2012 12.9  12.0 - 15.0 g/dL Final  . HCT 14/78/2956 41.5  36.0 - 46.0 % Final  . WBC 11/20/2012 12.1* 4.0 - 10.5 K/uL Final  . RBC 11/20/2012 5.26* 3.87 - 5.11 MIL/uL Final  . Hemoglobin 11/20/2012 12.5  12.0 - 15.0 g/dL Final  . HCT 21/30/8657 40.6  36.0 - 46.0 % Final  . MCV 11/20/2012 77.2* 78.0 - 100.0 fL Final  . MCH 11/20/2012 23.8* 26.0 - 34.0 pg Final  . MCHC 11/20/2012 30.8  30.0 - 36.0 g/dL Final  . RDW 84/69/6295 22.3* 11.5 - 15.5 % Final  . Platelets 11/20/2012 259  150 - 400 K/uL Final  . Neutrophils Relative % 11/20/2012 79* 43 - 77 % Final  . Neutro Abs 11/20/2012 9.5* 1.7 - 7.7 K/uL Final  . Lymphocytes Relative 11/20/2012 10* 12 - 46 % Final  . Lymphs Abs 11/20/2012 1.2  0.7 - 4.0 K/uL Final  . Monocytes Relative 11/20/2012 11  3 - 12 % Final  . Monocytes Absolute 11/20/2012 1.3* 0.1 - 1.0 K/uL Final  . Eosinophils Relative 11/20/2012 0  0 - 5 % Final  . Eosinophils Absolute 11/20/2012 0.0  0.0 - 0.7 K/uL Final  . Basophils Relative 11/20/2012 0  0 - 1 % Final  . Basophils Absolute 11/20/2012 0.0  0.0 - 0.1 K/uL Final  . Sodium 11/20/2012 137  135 - 145 mEq/L Final  . Potassium 11/20/2012 4.2  3.5 - 5.1 mEq/L Final  . Chloride 11/20/2012 101  96 - 112 mEq/L Final  . CO2 11/20/2012 25  19 - 32 mEq/L Final  . Glucose, Bld 11/20/2012 144* 70 - 99 mg/dL Final  . BUN 28/41/3244 7  6 - 23 mg/dL Final  . Creatinine, Ser 11/20/2012 0.64  0.50 - 1.10 mg/dL Final  . Calcium 01/25/7251 9.2  8.4 - 10.5 mg/dL Final  . Total Protein 11/20/2012 6.7  6.0 - 8.3 g/dL Final  . Albumin 66/44/0347 3.6  3.5 - 5.2 g/dL Final  . AST 42/59/5638 133* 0 - 37 U/L Final  . ALT 11/20/2012 134* 0 - 35 U/L Final  . Alkaline Phosphatase 11/20/2012 86  39 - 117 U/L Final  . Total Bilirubin 11/20/2012 0.3  0.3 - 1.2 mg/dL Final  . GFR calc non Af  Amer 11/20/2012 >90  >90 mL/min Final  . GFR calc Af Amer 11/20/2012 >90  >90 mL/min Final   Comment: (NOTE)                          The eGFR has  been calculated using the CKD EPI equation.                          This calculation has not been validated in all clinical situations.                          eGFR's persistently <90 mL/min signify possible Chronic Kidney                          Disease.  . Glucose-Capillary 11/19/2012 202* 70 - 99 mg/dL Final  . Comment 1 45/40/9811 Notify RN   Final  . Hemoglobin 11/20/2012 12.5  12.0 - 15.0 g/dL Final  . HCT 91/47/8295 40.1  36.0 - 46.0 % Final  . Glucose-Capillary 11/19/2012 161* 70 - 99 mg/dL Final  . Comment 1 62/13/0865 Notify RN   Final  . Glucose-Capillary 11/20/2012 136* 70 - 99 mg/dL Final  . Glucose-Capillary 11/20/2012 123* 70 - 99 mg/dL Final  . Glucose-Capillary 11/20/2012 131* 70 - 99 mg/dL Final  . Glucose-Capillary 11/20/2012 131* 70 - 99 mg/dL Final  . WBC 78/46/9629 13.1* 4.0 - 10.5 K/uL Final  . RBC 11/21/2012 5.19* 3.87 - 5.11 MIL/uL Final  . Hemoglobin 11/21/2012 12.7  12.0 - 15.0 g/dL Final  . HCT 52/84/1324 40.5  36.0 - 46.0 % Final  . MCV 11/21/2012 78.0  78.0 - 100.0 fL Final  . MCH 11/21/2012 24.5* 26.0 - 34.0 pg Final  . MCHC 11/21/2012 31.4  30.0 - 36.0 g/dL Final  . RDW 40/10/2723 22.8* 11.5 - 15.5 % Final  . Platelets 11/21/2012 217  150 - 400 K/uL Final  . Neutrophils Relative % 11/21/2012 77  43 - 77 % Final  . Lymphocytes Relative 11/21/2012 13  12 - 46 % Final  . Monocytes Relative 11/21/2012 10  3 - 12 % Final  . Eosinophils Relative 11/21/2012 0  0 - 5 % Final  . Basophils Relative 11/21/2012 0  0 - 1 % Final  . Neutro Abs 11/21/2012 10.1* 1.7 - 7.7 K/uL Final  . Lymphs Abs 11/21/2012 1.7  0.7 - 4.0 K/uL Final  . Monocytes Absolute 11/21/2012 1.3* 0.1 - 1.0 K/uL Final  . Eosinophils Absolute 11/21/2012 0.0  0.0 - 0.7 K/uL Final  . Basophils Absolute 11/21/2012 0.0  0.0 - 0.1 K/uL Final  . RBC Morphology 11/21/2012 OVALOCYTES   Final  . Glucose-Capillary 11/20/2012 142* 70 - 99 mg/dL Final  . Glucose-Capillary 11/20/2012 146* 70 - 99 mg/dL  Final  . Glucose-Capillary 11/21/2012 138* 70 - 99 mg/dL Final  . Glucose-Capillary 11/21/2012 147* 70 - 99 mg/dL Final  . Glucose-Capillary 11/21/2012 157* 70 - 99 mg/dL Final  Office Visit on 11/13/2012  Component Date Value Range Status  . WBC 11/13/2012 8.5  4.0 - 10.5 K/uL Final  . RBC 11/13/2012 5.05  3.87 - 5.11 MIL/uL Final  . Hemoglobin 11/13/2012 11.6* 12.0 - 15.0 g/dL Final  . HCT 36/64/4034 38.3  36.0 - 46.0 % Final  . MCV 11/13/2012 75.8* 78.0 - 100.0 fL Final  . MCH 11/13/2012 23.0* 26.0 -  34.0 pg Final  . MCHC 11/13/2012 30.3  30.0 - 36.0 g/dL Final  . RDW 16/10/9602 20.8* 11.5 - 15.5 % Final  . Platelets 11/13/2012 270  150 - 400 K/uL Final  . Neutrophils Relative % 11/13/2012 73  43 - 77 % Final  . Neutro Abs 11/13/2012 6.2  1.7 - 7.7 K/uL Final  . Lymphocytes Relative 11/13/2012 18  12 - 46 % Final  . Lymphs Abs 11/13/2012 1.6  0.7 - 4.0 K/uL Final  . Monocytes Relative 11/13/2012 8  3 - 12 % Final  . Monocytes Absolute 11/13/2012 0.7  0.1 - 1.0 K/uL Final  . Eosinophils Relative 11/13/2012 1  0 - 5 % Final  . Eosinophils Absolute 11/13/2012 0.1  0.0 - 0.7 K/uL Final  . Basophils Relative 11/13/2012 0  0 - 1 % Final  . Basophils Absolute 11/13/2012 0.0  0.0 - 0.1 K/uL Final  . Retic Ct Pct 11/13/2012 2.1  0.4 - 3.1 % Final  . RBC. 11/13/2012 5.05  3.87 - 5.11 MIL/uL Final  . Retic Count, Manual 11/13/2012 106.1  19.0 - 186.0 K/uL Final  . Sodium 11/13/2012 137  135 - 145 mEq/L Final  . Potassium 11/13/2012 3.9  3.5 - 5.1 mEq/L Final  . Chloride 11/13/2012 98  96 - 112 mEq/L Final  . CO2 11/13/2012 26  19 - 32 mEq/L Final  . Glucose, Bld 11/13/2012 110* 70 - 99 mg/dL Final  . BUN 54/09/8117 13  6 - 23 mg/dL Final  . Creatinine, Ser 11/13/2012 0.70  0.50 - 1.10 mg/dL Final  . Calcium 14/78/2956 9.3  8.4 - 10.5 mg/dL Final  . Total Protein 11/13/2012 7.3  6.0 - 8.3 g/dL Final  . Albumin 21/30/8657 3.8  3.5 - 5.2 g/dL Final  . AST 84/69/6295 18  0 - 37 U/L Final    . ALT 11/13/2012 15  0 - 35 U/L Final  . Alkaline Phosphatase 11/13/2012 90  39 - 117 U/L Final  . Total Bilirubin 11/13/2012 0.2* 0.3 - 1.2 mg/dL Final  . GFR calc non Af Amer 11/13/2012 >90  >90 mL/min Final  . GFR calc Af Amer 11/13/2012 >90  >90 mL/min Final   Comment: (NOTE)                          The eGFR has been calculated using the CKD EPI equation.                          This calculation has not been validated in all clinical situations.                          eGFR's persistently <90 mL/min signify possible Chronic Kidney                          Disease.  . Vitamin B-12 11/13/2012 918* 211 - 911 pg/mL Final   Performed at [facility]  . Ferritin 11/13/2012 17  10 - 291 ng/mL Final   Performed at [facility]  . Iron 11/13/2012 16* 42 - 135 ug/dL Corrected  . TIBC 11/13/2012 322  250 - 470 ug/dL Corrected  . Saturation Ratios 11/13/2012 5* 20 - 55 % Corrected  . UIBC 11/13/2012 306  125 - 400 ug/dL Corrected   Performed at [facility]  . Erythropoietin 11/13/2012  49.0* 2.6 - 18.5 mIU/mL Final   Comment: (NOTE)                          Because of diurnal variations in Erythropoietin levels, it is                          important to collect samples at a consistent time of day.  Morning                          samples collected between 7:30 AM and 12:00 noon are recommended.                          Performed at [facility]  . RBC Folate 11/13/2012 1661* >=366 ng/mL Final   Comment: Reference range not established for pediatric patients.                          Performed at [facility]  . Intrinsic Factor 11/13/2012 Negative  Negative Final   Performed at [facility]    PATHOLOGY:  Urinalysis No results found for this basename: colorurine, appearanceur, labspec, phurine, glucoseu, hgbur, bilirubinur, ketonesur, proteinur, urobilinogen, nitrite, leukocytesur    RADIOGRAPHIC STUDIES: Dg Ugi W/water Sol Cm  11/20/2012    CLINICAL DATA:  Postop day 1 gastric bypass.  EXAM: WATER SOLUBLE UPPER GI SERIES  TECHNIQUE: Single-column upper GI series was performed using water soluble contrast.  CONTRAST:  50mL OMNIPAQUE IOHEXOL 300 MG/ML  SOLN  COMPARISON:  None.  FLUOROSCOPY TIME:  1 minute and 9 seconds.  FINDINGS: Scout view of the abdomen shows mild gaseous prominence of bowel loops. Postoperative changes are seen in the left upper quadrant. Patient drank 50 cc of Omnipaque 300. There are postoperative changes of gastric bypass with contrast flowing readily into proximal small bowel. No opacification of the gastric remnant and no leak.  IMPRESSION: Normal postoperative day 1 appearance of gastric bypass. No leak.   Electronically Signed   By: Leanna Battles M.D.   On: 11/20/2012 09:42    ASSESSMENT:  #1. Iron deficiency anemia due to proton pump inhibitor therapy, status post iron infusion with normalization of hemoglobin and ferritin. #2. Morbid obesity, status post gastric bypass surgery, tolerated well. #3. Obstructive sleep apnea syndrome, using CPAP. #4. Gastroesophageal reflux disease, stable. #5. Diabetes mellitus, type II, non-insulin requiring, improved after surgery.   PLAN:  #1. The patient was reassured. #2. Patient was told not to take oral iron supplements. #3. Followup in 6 months with CBC and ferritin.   All questions were answered. The patient knows to call the clinic with any problems, questions or concerns. We can certainly see the patient much sooner if necessary.   I spent 25 minutes counseling the patient face to face. The total time spent in the appointment was 30 minutes.    Maurilio Lovely, MD 12/11/2012 11:29 AM

## 2012-12-11 NOTE — Telephone Encounter (Signed)
Received fax refill request ° °Rx # [id number] °Medication:  Amlodipine Besylate 5 mg tab °Qty 30 °Sig:  Take one tablet by mouth once daily °Physician:  Koneswaran  ° ° °

## 2012-12-11 NOTE — Patient Instructions (Signed)
[facility] Discharge Instructions  RECOMMENDATIONS MADE BY THE CONSULTANT AND ANY TEST RESULTS WILL BE SENT TO YOUR REFERRING PHYSICIAN.  EXAM FINDINGS BY THE PHYSICIAN TODAY AND SIGNS OR SYMPTOMS TO REPORT TO CLINIC OR PRIMARY PHYSICIAN: Exam and findings as discussed by Dr. Zigmund Daniel.  Do not take the oral iron.  Report increased ice intake, fatigue, shortness of breath, etc.  MEDICATIONS PRESCRIBED:  none  INSTRUCTIONS/FOLLOW-UP: Blood work and MD visit in 6 months.  Thank you for choosing [facility] to provide your oncology and hematology care.  To afford each patient quality time with our providers, please arrive at least 15 minutes before your scheduled appointment time.  With your help, our goal is to use those 15 minutes to complete the necessary work-up to ensure our physicians have the information they need to help with your evaluation and healthcare recommendations.    Effective January 1st, 2014, we ask that you re-schedule your appointment with our physicians should you arrive 10 or more minutes late for your appointment.  We strive to give you quality time with our providers, and arriving late affects you and other patients whose appointments are after yours.    Again, thank you for choosing [facility].  Our hope is that these requests will decrease the amount of time that you wait before being seen by our physicians.       _____________________________________________________________  Should you have questions after your visit to [facility], please contact our office at [phone] between the hours of 8:30 a.m. and 5:00 p.m.  Voicemails left after 4:30 p.m. will not be returned until the following business day.  For prescription refill requests, have your pharmacy contact our office with your prescription refill request.

## 2012-12-11 NOTE — Telephone Encounter (Signed)
Has not seen a cardiologist since 2012. Will need to be refilled by PCP.

## 2012-12-16 DIAGNOSIS — F339 Major depressive disorder, recurrent, unspecified: Secondary | ICD-10-CM | POA: Diagnosis not present

## 2012-12-24 ENCOUNTER — Other Ambulatory Visit: Payer: Self-pay | Admitting: Family Medicine

## 2013-01-02 ENCOUNTER — Encounter (INDEPENDENT_AMBULATORY_CARE_PROVIDER_SITE_OTHER): Payer: Self-pay

## 2013-01-02 ENCOUNTER — Ambulatory Visit (INDEPENDENT_AMBULATORY_CARE_PROVIDER_SITE_OTHER): Payer: Medicare Other | Admitting: Physician Assistant

## 2013-01-02 DIAGNOSIS — Z9884 Bariatric surgery status: Secondary | ICD-10-CM

## 2013-01-02 DIAGNOSIS — R112 Nausea with vomiting, unspecified: Secondary | ICD-10-CM

## 2013-01-02 DIAGNOSIS — D509 Iron deficiency anemia, unspecified: Secondary | ICD-10-CM

## 2013-01-02 DIAGNOSIS — Z09 Encounter for follow-up examination after completed treatment for conditions other than malignant neoplasm: Secondary | ICD-10-CM

## 2013-01-02 DIAGNOSIS — K912 Postsurgical malabsorption, not elsewhere classified: Secondary | ICD-10-CM

## 2013-01-02 NOTE — Patient Instructions (Signed)
Obtain your x-ray and laboratory studies. Follow-up with [place] in one month. Contact us should you have worsening symptoms including inability to take liquids. Contact your primary care physician regarding refills of your blood pressure medication.

## 2013-01-02 NOTE — Progress Notes (Signed)
  HISTORY: Carrie Cook is a 52 y.o.female who received a Roux-en-Y Gastric Bypass in October 2014 by Dr. Johna Sheriff. She comes in with complaints of nausea/dizziness worsening over the past two weeks. She is still having difficulty with solid foods, no matter how small a bite or how much she chews. She did have a small piece of packaged roast beef the other day which she tolerated. Otherwise she's depending on soft foods, including baby food, for her nutrition. She reports having not taken one of her blood pressure medications for the past two weeks as she's run out and she hasn't renewed the prescription. She associates this with some headaches and nausea. Motion, including being in the car, exacerbates this. She is taking liquids without difficulty however. Her urine output is good and her bowels are functioning normally. She has no complaint of abdominal pain. Solids cause substernal pain, prompting her to attempt to regurgitate.  VITAL SIGNS: Filed Vitals:   01/02/13 1359  BP: 138/96  Pulse: 72  Temp: 97.3 F (36.3 C)  Resp: 18    PHYSICAL EXAM: Physical exam reveals a very well-appearing 52 y.o.female in no apparent distress Neurologic: Awake, alert, oriented Psych: Bright affect, conversant Respiratory: Breathing even and unlabored. No stridor or wheezing Abdomen: Soft, nontender, nondistended to palpation. Incisions well-healed. No incisional hernias. Extremities: Atraumatic, good range of motion.  ASSESMENT: 52 y.o.  female  s/p Laparoscopic Roux-en-Y Gastric Bypass.   PLAN: I discussed her case with Dr. Andrey Campanile. We'd like to obtain an upper GI to evaluate her pouch and anastomosis. We'll also draw fasting labs. She will follow-up with [place] following her studies. I asked her to contact us should she have worsening symptoms including inability to tolerate liquids or persistent abdominal pain.

## 2013-01-03 ENCOUNTER — Telehealth: Payer: Self-pay | Admitting: Cardiovascular Disease

## 2013-01-03 MED ORDER — AMLODIPINE BESYLATE 5 MG PO TABS: 5.0000 mg | ORAL_TABLET | Freq: Every morning | ORAL | Status: DC

## 2013-01-03 NOTE — Progress Notes (Signed)
Discussed with PA. Agree with plan. If UGI normal and symptoms persist, may need EGD

## 2013-01-03 NOTE — Telephone Encounter (Signed)
Received fax refill request ° °Rx # [id number] °Medication:  Amlodipine Besylate 5 mg tab °Qty 30 °Sig:  Take one tablet by mouth once daily °Physician:  Koneswaran  ° ° °

## 2013-01-06 ENCOUNTER — Other Ambulatory Visit (INDEPENDENT_AMBULATORY_CARE_PROVIDER_SITE_OTHER): Payer: Self-pay | Admitting: Physician Assistant

## 2013-01-06 ENCOUNTER — Telehealth: Payer: Self-pay | Admitting: Cardiovascular Disease

## 2013-01-06 ENCOUNTER — Ambulatory Visit
Admission: RE | Admit: 2013-01-06 | Discharge: 2013-01-06 | Disposition: A | Discharge status: Home / Self Care | Payer: Medicare Other | Source: Ambulatory Visit | Attending: Physician Assistant | Admitting: Physician Assistant

## 2013-01-06 ENCOUNTER — Other Ambulatory Visit: Payer: Self-pay

## 2013-01-06 DIAGNOSIS — K912 Postsurgical malabsorption, not elsewhere classified: Secondary | ICD-10-CM | POA: Diagnosis not present

## 2013-01-06 DIAGNOSIS — R112 Nausea with vomiting, unspecified: Secondary | ICD-10-CM

## 2013-01-06 DIAGNOSIS — Z09 Encounter for follow-up examination after completed treatment for conditions other than malignant neoplasm: Secondary | ICD-10-CM | POA: Diagnosis not present

## 2013-01-06 DIAGNOSIS — Z9884 Bariatric surgery status: Secondary | ICD-10-CM

## 2013-01-06 DIAGNOSIS — K228 Other specified diseases of esophagus: Secondary | ICD-10-CM | POA: Diagnosis not present

## 2013-01-06 DIAGNOSIS — D509 Iron deficiency anemia, unspecified: Secondary | ICD-10-CM | POA: Diagnosis not present

## 2013-01-06 DIAGNOSIS — K2289 Other specified disease of esophagus: Secondary | ICD-10-CM | POA: Diagnosis not present

## 2013-01-06 LAB — CBC WITH DIFFERENTIAL/PLATELET
Basophils Absolute: 0 10*3/uL (ref 0.0–0.1)
Basophils Relative: 0 % (ref 0–1)
Eosinophils Absolute: 0.2 10*3/uL (ref 0.0–0.7)
Eosinophils Relative: 2 % (ref 0–5)
HCT: 45.8 % (ref 36.0–46.0)
Hemoglobin: 15.6 g/dL — ABNORMAL HIGH (ref 12.0–15.0)
Lymphocytes Relative: 25 % (ref 12–46)
Lymphs Abs: 1.7 10*3/uL (ref 0.7–4.0)
MCH: 28.2 pg (ref 26.0–34.0)
MCHC: 34.1 g/dL (ref 30.0–36.0)
MCV: 82.7 fL (ref 78.0–100.0)
Monocytes Absolute: 0.6 10*3/uL (ref 0.1–1.0)
Monocytes Relative: 8 % (ref 3–12)
Neutro Abs: 4.5 10*3/uL (ref 1.7–7.7)
Neutrophils Relative %: 65 % (ref 43–77)
Platelets: 255 10*3/uL (ref 150–400)
RBC: 5.54 MIL/uL — ABNORMAL HIGH (ref 3.87–5.11)
RDW: 20.9 % — ABNORMAL HIGH (ref 11.5–15.5)
WBC: 7 10*3/uL (ref 4.0–10.5)

## 2013-01-06 LAB — COMPREHENSIVE METABOLIC PANEL
ALT: 50 U/L — ABNORMAL HIGH (ref 0–35)
AST: 31 U/L (ref 0–37)
Albumin: 4.5 g/dL (ref 3.5–5.2)
Alkaline Phosphatase: 117 U/L (ref 39–117)
BUN: 17 mg/dL (ref 6–23)
CO2: 28 mEq/L (ref 19–32)
Calcium: 9.5 mg/dL (ref 8.4–10.5)
Chloride: 103 mEq/L (ref 96–112)
Creat: 0.73 mg/dL (ref 0.50–1.10)
Glucose, Bld: 141 mg/dL — ABNORMAL HIGH (ref 70–99)
Potassium: 4.4 mEq/L (ref 3.5–5.3)
Sodium: 141 mEq/L (ref 135–145)
Total Bilirubin: 0.3 mg/dL (ref 0.3–1.2)
Total Protein: 6.7 g/dL (ref 6.0–8.3)

## 2013-01-06 LAB — LIPID PANEL
Cholesterol: 211 mg/dL — ABNORMAL HIGH (ref 0–200)
HDL: 33 mg/dL — ABNORMAL LOW (ref >39)
LDL Cholesterol: 140 mg/dL — ABNORMAL HIGH (ref 0–99)
Total CHOL/HDL Ratio: 6.4 Ratio
Triglycerides: 190 mg/dL — ABNORMAL HIGH (ref <150)
VLDL: 38 mg/dL (ref 0–40)

## 2013-01-06 LAB — VITAMIN B12: Vitamin B-12: 800 pg/mL (ref 211–911)

## 2013-01-06 LAB — HEMOGLOBIN A1C
Hgb A1c MFr Bld: 5.9 % — ABNORMAL HIGH (ref <5.7)
Mean Plasma Glucose: 123 mg/dL — ABNORMAL HIGH (ref <117)

## 2013-01-06 LAB — IRON AND TIBC
%SAT: 34 % (ref 20–55)
Iron: 93 ug/dL (ref 42–145)
TIBC: 277 ug/dL (ref 250–470)
UIBC: 184 ug/dL (ref 125–400)

## 2013-01-06 MED ORDER — AMLODIPINE BESYLATE 5 MG PO TABS: 5.0000 mg | ORAL_TABLET | Freq: Every morning | ORAL | Status: DC

## 2013-01-06 NOTE — Telephone Encounter (Signed)
Received fax refill request ° °Rx # [id number] °Medication:  Amlodipine Besylate 5 mg tab °Qty 30 °Sig:  Take one tablet by mouth once daily °Physician:  Koneswaran  ° ° °

## 2013-01-07 ENCOUNTER — Telehealth: Payer: Self-pay | Admitting: Cardiovascular Disease

## 2013-01-07 MED ORDER — AMLODIPINE BESYLATE 5 MG PO TABS: 5.0000 mg | ORAL_TABLET | Freq: Every morning | ORAL | Status: DC

## 2013-01-07 NOTE — Telephone Encounter (Signed)
Received fax refill request ° °Rx # [id number] °Medication:  Amlodipine Besylate 5 mg tab °Qty 30 °Sig:  Take one tablet by mouth once daily °Physician:  Koneswaran  ° ° °

## 2013-01-07 NOTE — Telephone Encounter (Signed)
rx refill done

## 2013-01-21 ENCOUNTER — Other Ambulatory Visit: Payer: Self-pay | Admitting: Cardiovascular Disease

## 2013-01-21 ENCOUNTER — Encounter: Payer: Medicare Other | Attending: General Surgery | Admitting: Dietician

## 2013-01-21 DIAGNOSIS — Z713 Dietary counseling and surveillance: Secondary | ICD-10-CM | POA: Insufficient documentation

## 2013-01-21 NOTE — Telephone Encounter (Signed)
rx to pharmacy 

## 2013-01-21 NOTE — Telephone Encounter (Signed)
Received fax refill request ° °Rx # [id number] °Medication:  Amlodipine Besylate 5 mg tab °Qty 30 °Sig:  Take one tablet by mouth once daily °Physician:  Koneswaran  ° ° °

## 2013-01-21 NOTE — Progress Notes (Signed)
  Follow-up visit:  8 Weeks Post-Operative RYGB Surgery  Medical Nutrition Therapy:  Appt start time: 1230 end time:  1300.  Primary concerns today: Post-operative Bariatric Surgery Nutrition Management. Jessabelle returns today with a 16 lb weight loss. States she had an upper GI to see if the opening to the pouch was too small. The pouch was ok, but foods are getting stuck in the esophagus. Feels hungry even  though many foods feel stuck. Has an appointment with Dr. Johna Sheriff in mid January to determine if her esophagus needs to be stretched.   Feel "scared to eat" and is not currently trying to eat foods that previously go stuck. Able to eat roast beef and deli chicken slices. Also able to eat baby food green beans and sometimes chicken. Sticking mostly to yogurt and protein shakes for protein. Having 8-10 containers of yogurt per day.   Surgery date: 10/21/12 Surgery type: RYGB Start weight at [facility]: 233.0 lbs (08/12/12) Weight today: 199.0 lbs  Weight lost since last visit: 16 lbs Total weight lost: 34 lbs   TANITA  BODY COMP RESULTS  09/26/12 12/03/12 01/21/13   BMI (kg/m^2) 42.5 38.2 35.3   Fat Mass (lbs) 111.5 98.5 91.0   Fat Free Mass (lbs) 128.5 117.0 108.0   Total Body Water (lbs) 94.0 85.5 79.0     Preferred Learning Style:  No preference indicated   Learning Readiness:   Ready  24-hr recall: B (AM): yogurt (12 g protein) Snk (AM): yogurt (12 g protein) + cheese stick  L (PM): Baby food green beans and 2-3 oz roast beef or EAS protein shake (17 g protein) Snk (PM): yogurt or protein shake  D (PM): Baby food green beans and 2-3 oz roast beef or EAS protein shake (17 g protein) Snk (PM): yogurt or cheese or cheese stick  Fluid intake: 16 oz water, 3, 12 oz coffee (1 is caffeine),  2 x week hot tea, 22 - 40 oz protein shakes (2-3) sometimes with [place] and skim milk.  Estimated total protein intake: having at least 150 g protein  Medications: insulin Supplementation:  Taking  Average CBG per patient: 115-140 mg/dl fasting, and lower at night Last patient reported A1c: 5.3% two weeks ago  Using straws: No Drinking while eating: No Hair loss: Yes Carbonated beverages: No N/V/D/C: nausea and vomiting when food gets stuck, feeling constipated  Dumping syndrome: No   Recent physical activity:  Walking, housework, taking care of grandson  Progress Towards Goal(s):  In progress.  Handouts given during visit include:  Phase 3B High Protein + NS Vegetables   Nutritional Diagnosis:  [place]-3.3 Overweight/obesity related to past poor dietary habits and physical inactivity as evidenced by patient w/ recent RYGB surgery following dietary guidelines for continued weight loss.    Intervention:  Nutrition education/diet advancement. Recommended that Ermal try to foods that stay in her pouch longer than she can tolerate to help prevent sliding/overeating. Suggested adding more non-starchy vegetables or pureed beans.   Teaching Method Utilized:  Visual Auditory  Barriers to learning/adherence to lifestyle change: many foods feeling stuck in her esophagus  Demonstrated degree of understanding via:  Teach Back   Monitoring/Evaluation:  Dietary intake, exercise, lap band fills, and body weight. Follow up in 1 months for 3 month post-op visit.

## 2013-01-21 NOTE — Patient Instructions (Addendum)
Goals:  Follow Phase 3B: High Protein + Non-Starchy Vegetables  Eat 3-6 small meals/snacks, every 3-5 hrs  Aim to get 60-80 g protein per day  Increase fluid intake to 64oz + (mostly water)  Avoid drinking 15 minutes before, during and 30 minutes after eating  Aim for >30 min of physical activity daily  Limit yogurt 3 or 4 containers per day  Try pureed beans for your protein at meals or snacks   Try any vegetable you feel comfortable trying (baby food is ok)  Overall, choose foods or protein shakes that keep you fuller longer (instead of yogurts)

## 2013-02-06 ENCOUNTER — Ambulatory Visit (INDEPENDENT_AMBULATORY_CARE_PROVIDER_SITE_OTHER): Payer: Medicare Other | Admitting: Physician Assistant

## 2013-02-06 ENCOUNTER — Encounter (INDEPENDENT_AMBULATORY_CARE_PROVIDER_SITE_OTHER): Payer: Self-pay

## 2013-02-06 DIAGNOSIS — K912 Postsurgical malabsorption, not elsewhere classified: Secondary | ICD-10-CM

## 2013-02-06 DIAGNOSIS — Z9884 Bariatric surgery status: Secondary | ICD-10-CM

## 2013-02-06 NOTE — Progress Notes (Signed)
  HISTORY: Carrie Cook is a 53 y.o.female who received a Roux-en-Y Gastric Bypass in October 2014 by Dr. Excell Seltzer. She comes in today with 48 lbs total weight loss since surgery, 9 lbs since her last visit one month ago. She reports much improvement in her symptoms from last month. She is having far less difficulty swallowing than previously. Dry proteins can give her trouble but moist foods do very well. Her dizziness has vastly improved since resuming Norvasc. 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 She reports no abdominal pain or vomiting.  VITAL SIGNS: Filed Vitals:   02/06/13 1119  BP: 122/90  Pulse: 82  Temp: 98.4 F (36.9 C)  Resp: 14    PHYSICAL EXAM: Physical exam reveals a very well-appearing 53 y.o.female in no apparent distress Neurologic: Awake, alert, oriented Psych: Bright affect, conversant Respiratory: Breathing even and unlabored. No stridor or wheezing Abdomen: Soft, nontender, nondistended to palpation. Incisions well-healed. No incisional hernias. Extremities: Atraumatic, good range of motion.  ASSESMENT: 53 y.o.  female  s/p Laparoscopic Roux-en-Y Gastric Bypass.   PLAN: We reviewed her Upper GI results from her last visit, which showed no evidence of stricture or obstruction. She has a degree of decreased esophageal motility. We reviewed her laboratory studies as well - I congratulated her on a significant improvement in her Hgb A1C. We'll have her return in three months with labs at that time. I asked her to return sooner if she has increasing dysphagia or abdominal pain.

## 2013-02-06 NOTE — Patient Instructions (Signed)
Continue your daily activities. Continue your diet as recommended by nutrition. Return in three months with your laboratory tests as ordered today. Return also if you have difficulty swallowing, abdominal pain.

## 2013-02-17 ENCOUNTER — Telehealth: Payer: Self-pay | Admitting: Cardiovascular Disease

## 2013-02-17 MED ORDER — AMLODIPINE BESYLATE 5 MG PO TABS: 5.0000 mg | ORAL_TABLET | Freq: Every morning | ORAL | Status: DC

## 2013-02-17 NOTE — Telephone Encounter (Signed)
Received fax refill request  Rx # [id number] Medication:  Amlodipine Besylate 5 mg tab Qty 30 Sig:  Take one tablet by mouth once daily Physician:  Bronson Ing

## 2013-02-17 NOTE — Telephone Encounter (Signed)
Medication sent via escribe.  

## 2013-02-18 NOTE — Telephone Encounter (Signed)
Error / tgs  °

## 2013-02-27 ENCOUNTER — Ambulatory Visit: Payer: Medicare Other | Admitting: Dietician

## 2013-03-06 ENCOUNTER — Encounter: Payer: Medicare Other | Attending: General Surgery | Admitting: Dietician

## 2013-03-06 DIAGNOSIS — Z713 Dietary counseling and surveillance: Secondary | ICD-10-CM | POA: Insufficient documentation

## 2013-03-06 NOTE — Patient Instructions (Addendum)
Goals:  Follow Phase 3B: High Protein + Non-Starchy Vegetables  Eat 3-6 small meals/snacks, every 3-5 hrs  Aim to get 60-80 g protein per day  Have 2-3 oz protein at meals and 1-2 oz of protein at snacks if you are hungry  Start adding baby spinach to meals or snacks  Increase fluid intake to 64oz + (mostly water)  Avoid drinking 15 minutes before, during and 30 minutes after eating  Aim for >30 min of physical activity daily  Limit yogurt to 2 containers MAX per day (one at time)

## 2013-03-06 NOTE — Progress Notes (Signed)
  Follow-up visit:  12 Weeks Post-Operative RYGB Surgery  Medical Nutrition Therapy:  Appt start time: 300 end time:  330.  Primary concerns today: Post-operative Bariatric Surgery Nutrition Management. Carrie Cook returns today with a 12.5 lbs weight loss. Had an appointment in mid January and determined that she had some narrowing in her esophagus. Feels heartburn in her chest. Has had a lot of stress at home lately related to custody issues with her grandson. Has difficulty eating when feeling stressed.   Overall, has been doing better with eating. Able to eat meats though not eggs. No longer eating pureed vegetables.   Having 8 or less yogurts per day (average 4).   Surgery date: 10/21/12 Surgery type: RYGB Start weight at [facility]: 233.0 lbs (08/12/12) Weight today: 186.5 lbs  Weight lost since last visit: 12.5 lbs Total weight lost: 46.5 lbs   TANITA  BODY COMP RESULTS  09/26/12 12/03/12 01/21/13 03/06/13   BMI (kg/m^2) 42.5 38.2 35.3 33.0   Fat Mass (lbs) 111.5 98.5 91.0 80.5   Fat Free Mass (lbs) 128.5 117.0 108.0 106.0   Total Body Water (lbs) 94.0 85.5 79.0 77.5    Preferred Learning Style:  No preference indicated   Learning Readiness:   Ready  24-hr recall: B (AM): yogurt (12 g protein) or EAS shake (12-17 g) Snk (AM): sometimes yogurt (12 g) L (PM): lean cuisine [place] with green beans or fat free refried beans with cheese (15 g) Snk (PM): yogurt or protein shake (12-17) D (PM): lean cuisine [place] with green beans or fat free refried beans with cheese (15 g) or 4 oz steak (28g) Snk (PM): yogurt or cheese or cheese stick (12-17)  Fluid intake: water, protein shake, 4 cups coffee (total at least 64 fl oz)  Estimated total protein intake: having at least 150 g protein  Medications: insulin Supplementation: Taking  Average CBG per patient: 115-140 mg/dl fasting, and lower at night, testing every few days Last patient reported A1c: 5.3% two weeks ago  Using straws:  No Drinking while eating: No Hair loss: Yes - getting better  Carbonated beverages: No N/V/D/C: nausea and vomiting when food gets stuck (when under stress), constipation "comes and goes" Dumping syndrome: No   Recent physical activity:  Walking, housework, taking care of horses (overall active)  Progress Towards Goal(s):  In progress.    Nutritional Diagnosis:  [name]-3.3 Overweight/obesity related to past poor dietary habits and physical inactivity as evidenced by patient w/ recent RYGB surgery following dietary guidelines for continued weight loss.    Intervention:  Nutrition education/diet advancement. Recommended that [name] try to foods that stay in her pouch longer than she can tolerate to help prevent sliding/overeating. Suggested adding more non-starchy vegetables.   Teaching Method Utilized:  Visual Auditory  Barriers to learning/adherence to lifestyle change: still having a lot of yogurts per day  Demonstrated degree of understanding via:  Teach Back   Monitoring/Evaluation:  Dietary intake, exercise, and body weight. Follow up in 3 months for 6 month post-op visit.

## 2013-03-12 ENCOUNTER — Ambulatory Visit: Payer: Medicare Other | Admitting: Family Medicine

## 2013-03-22 ENCOUNTER — Other Ambulatory Visit: Payer: Self-pay | Admitting: Family Medicine

## 2013-03-25 ENCOUNTER — Telehealth: Payer: Self-pay | Admitting: Family Medicine

## 2013-03-25 NOTE — Telephone Encounter (Signed)
LMRC for pt  °

## 2013-03-25 NOTE — Telephone Encounter (Signed)
In this situation, I went into the website and it was totally nonhelpful for Dr./doctor offices. I would recommend connecting with the patient letting them know that [employer] does not cover their medicine and the patient needs to talk with the insurance company that does her drug coverage to find out what insulin is covered and bring this to [place]. Better yet if the patient is able to have the formulary sent to her by that company specifically what insulin this are covered then she needs to come see [place] and we can prescribe what is covered. Patient is due for an office visit anyway.

## 2013-03-25 NOTE — Telephone Encounter (Signed)
Spoke with patient, states she will look for her formulary list or call to see if she can get one and will call us back to schedule appointment to get new rx for her insulin.

## 2013-03-25 NOTE — Telephone Encounter (Signed)
Received letter from [employer], pt's NOVOLIN 70/30 inj relion is NOT on their formulary, also states "N/A" to alternatives, looked in Epic chart, pt's not been seen since Nov 2014 and at that office visit we discontinued Humalog.  Please advise?  Try to get Novolin covered? Change med?  Pt NTBS? See letter on front of paper chart

## 2013-03-27 ENCOUNTER — Other Ambulatory Visit: Payer: Self-pay | Admitting: Family Medicine

## 2013-04-10 NOTE — Telephone Encounter (Addendum)
error 

## 2013-04-11 DIAGNOSIS — F339 Major depressive disorder, recurrent, unspecified: Secondary | ICD-10-CM | POA: Diagnosis not present

## 2013-04-24 ENCOUNTER — Other Ambulatory Visit: Payer: Self-pay | Admitting: Family Medicine

## 2013-05-01 DIAGNOSIS — K912 Postsurgical malabsorption, not elsewhere classified: Secondary | ICD-10-CM | POA: Diagnosis not present

## 2013-05-01 DIAGNOSIS — Z9884 Bariatric surgery status: Secondary | ICD-10-CM | POA: Diagnosis not present

## 2013-05-01 LAB — CBC
HCT: 45.7 % (ref 36.0–46.0)
Hemoglobin: 16.4 g/dL — ABNORMAL HIGH (ref 12.0–15.0)
MCH: 31.4 pg (ref 26.0–34.0)
MCHC: 35.9 g/dL (ref 30.0–36.0)
MCV: 87.4 fL (ref 78.0–100.0)
Platelets: 233 10*3/uL (ref 150–400)
RBC: 5.23 MIL/uL — ABNORMAL HIGH (ref 3.87–5.11)
RDW: 13.2 % (ref 11.5–15.5)
WBC: 8 10*3/uL (ref 4.0–10.5)

## 2013-05-01 LAB — IRON AND TIBC
%SAT: 46 % (ref 20–55)
Iron: 112 ug/dL (ref 42–145)
TIBC: 245 ug/dL — ABNORMAL LOW (ref 250–470)
UIBC: 133 ug/dL (ref 125–400)

## 2013-05-01 LAB — COMPLETE METABOLIC PANEL WITH GFR
ALT: 43 U/L — ABNORMAL HIGH (ref 0–35)
AST: 25 U/L (ref 0–37)
Albumin: 3.9 g/dL (ref 3.5–5.2)
Alkaline Phosphatase: 105 U/L (ref 39–117)
BUN: 15 mg/dL (ref 6–23)
CO2: 26 mEq/L (ref 19–32)
Calcium: 9 mg/dL (ref 8.4–10.5)
Chloride: 105 mEq/L (ref 96–112)
Creat: 0.68 mg/dL (ref 0.50–1.10)
GFR, Est African American: >89 mL/min
GFR, Est Non African American: >89 mL/min
Glucose, Bld: 109 mg/dL — ABNORMAL HIGH (ref 70–99)
Potassium: 4.2 mEq/L (ref 3.5–5.3)
Sodium: 139 mEq/L (ref 135–145)
Total Bilirubin: 0.4 mg/dL (ref 0.2–1.2)
Total Protein: 6 g/dL (ref 6.0–8.3)

## 2013-05-01 LAB — LIPID PANEL
Cholesterol: 163 mg/dL (ref 0–200)
HDL: 29 mg/dL — ABNORMAL LOW (ref >39)
LDL Cholesterol: 99 mg/dL (ref 0–99)
Total CHOL/HDL Ratio: 5.6 Ratio
Triglycerides: 175 mg/dL — ABNORMAL HIGH (ref <150)
VLDL: 35 mg/dL (ref 0–40)

## 2013-05-01 LAB — HEMOGLOBIN A1C
Hgb A1c MFr Bld: 5.7 % — ABNORMAL HIGH (ref <5.7)
Mean Plasma Glucose: 117 mg/dL — ABNORMAL HIGH (ref <117)

## 2013-05-08 ENCOUNTER — Ambulatory Visit (INDEPENDENT_AMBULATORY_CARE_PROVIDER_SITE_OTHER): Payer: Medicare Other | Admitting: Physician Assistant

## 2013-05-08 ENCOUNTER — Encounter (INDEPENDENT_AMBULATORY_CARE_PROVIDER_SITE_OTHER): Payer: Self-pay

## 2013-05-08 DIAGNOSIS — K912 Postsurgical malabsorption, not elsewhere classified: Secondary | ICD-10-CM

## 2013-05-08 DIAGNOSIS — Z9884 Bariatric surgery status: Secondary | ICD-10-CM

## 2013-05-08 NOTE — Patient Instructions (Signed)
Return in October. Focus on good food choices as well as physical activity. 

## 2013-05-08 NOTE — Progress Notes (Signed)
  HISTORY: Carrie Cook is a 53 y.o.female who received a Roux-en-Y Gastric Bypass in October 2014 by Dr. Excell Seltzer. She comes in today with about 16 lbs weight loss since her last visit in January. She has lost almost 65 lbs since surgery. Her primary complaint is fatigue, although she has had changes in her antidepressants since seeing me last. 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 She is working hard to make good food choices but sweet cravings are still a battle for her. She has no complaints of abdominal pain.  VITAL SIGNS: Filed Vitals:   05/08/13 0931  BP: 130/80  Pulse: 71  Temp: 98.1 F (36.7 C)  Resp: 16    PHYSICAL EXAM: Physical exam reveals a very well-appearing 53 y.o.female in no apparent distress Neurologic: Awake, alert, oriented Psych: Bright affect, conversant Respiratory: Breathing even and unlabored. No stridor or wheezing Abdomen: Soft, nontender, nondistended to palpation. Incisions well-healed. No incisional hernias. Extremities: Atraumatic, good range of motion.  ASSESMENT: 53 y.o.  female  s/p Laparoscopic Roux-en-Y Gastric Bypass.   PLAN: We reviewed labs this morning. All are improving, especially her A1C which is just on the cusp of normal. I congratulated her on her continued weight loss success. I encouraged her to maintain her physical activity and to work hard to make smart dietary choices. She is plugged in with nutrition. I'll have her back in October for her one-year visit with labs at that time.

## 2013-05-12 ENCOUNTER — Ambulatory Visit (HOSPITAL_COMMUNITY): Payer: Medicare Other

## 2013-05-22 DIAGNOSIS — F339 Major depressive disorder, recurrent, unspecified: Secondary | ICD-10-CM | POA: Diagnosis not present

## 2013-06-04 ENCOUNTER — Encounter: Payer: Medicare Other | Attending: General Surgery | Admitting: Dietician

## 2013-06-04 DIAGNOSIS — Z713 Dietary counseling and surveillance: Secondary | ICD-10-CM | POA: Diagnosis not present

## 2013-06-04 NOTE — Patient Instructions (Addendum)
Goals:  Follow Phase 3B: High Protein + Non-Starchy Vegetables  Eat 3-6 small meals/snacks, every 3-5 hrs  Aim to get 60-80 g protein per day  Have 2-3 oz protein at meals and 1-2 oz of protein at snacks if you are hungry  Increase fluid intake to 64oz + (mostly water)  Avoid drinking 15 minutes before, during and 30 minutes after eating  Aim for >30 min of physical activity daily  Talk to your surgeon to see if you should add heartburn medicine.   Cut out the yogurt or anything that tastes really sweet "dessert"  Try having an egg with vegetables in the morning for breakfast  For a snack, try having a small amount of fruit (15 g) with nuts or cheese or cottage cheese

## 2013-06-04 NOTE — Progress Notes (Signed)
  Follow-up visit:  7 Months Post-Operative RYGB Surgery  Medical Nutrition Therapy:  Appt start time: 300 end time:  330.  Primary concerns today: Post-operative Bariatric Surgery Nutrition Management. Carrie Cook returns today with a 7 lb weight loss. States she should be doing better since she "got into sweets" such as cookies and ice cream. Has been having spells of depression and is being treated by a psychologist. Still having about 4 yogurts (between 2-8). States that eating yogurt prevents her from having sweets. Having protein shakes about every other week. Able to tolerate eggs. Still having heartburn and discussed it with the surgeon. Solid foods can cause discomfort. Feels hungry throughout the day.   Eating less sweets now that Effexor has been increased.  Surgery date: 10/21/12 Surgery type: RYGB Start weight at [facility]: 233.0 lbs (08/12/12) Weight today: 177.5 lbs  Weight lost since last visit: 9 lbs Total weight lost: 55.5 lbs   TANITA  BODY COMP RESULTS  09/26/12 12/03/12 01/21/13 03/06/13 06/04/13   BMI (kg/m^2) 42.5 38.2 35.3 33.0 31.4   Fat Mass (lbs) 111.5 98.5 91.0 80.5 73.5   Fat Free Mass (lbs) 128.5 117.0 108.0 106.0 104.0   Total Body Water (lbs) 94.0 85.5 79.0 77.5 76.0    Preferred Learning Style:  No preference indicated   Learning Readiness:   Ready  24-hr recall: B (AM): yogurt (12 g protein)  Snk (AM): sometimes yogurt (12 g) L (PM): lean cuisine [place] with green beans or fat free refried beans with cheese (15 g) Snk (PM): yogurt  (12) D (PM): lean cuisine [place] with green beans or fat free refried beans with cheese (15 g) or 4 oz steak (28g) Snk (PM): yogurt or cheese or cheese stick (12-17)  Fluid intake: 1 water bottle, protein shake (rare), 4 cups coffee, 1-2 cups decaf (total at least 64 fl oz)  Estimated total protein intake: having at least 75 g protein  Medications: insulin Supplementation: stopped taking calcium since she eating a lot of  yogurt  Average CBG per patient: 115-128 mg/dl fasting testing once per week Last patient reported A1c: 5.3% two weeks ago  Using straws: No Drinking while eating: No Hair loss: Yes - getting better  Carbonated beverages: No N/V/D/C: nausea and vomiting when food gets stuck (when under stress), constipation "comes and goes" Dumping syndrome: No  Recent physical activity:  Walking, yard work, housework, taking care of horses (overall active)  Progress Towards Goal(s):  In progress.    Nutritional Diagnosis:  [place]-3.3 Overweight/obesity related to past poor dietary habits and physical inactivity as evidenced by patient w/ recent RYGB surgery following dietary guidelines for continued weight loss.    Intervention:  Nutrition education/diet advancement. Recommended that Carrie Cook try to foods that stay in her pouch longer than she can tolerate to help prevent sliding/overeating. Suggested adding more non-starchy vegetables.   Teaching Method Utilized:  Visual Auditory  Barriers to learning/adherence to lifestyle change: still having a lot of yogurts per day  Demonstrated degree of understanding via:  Teach Back   Monitoring/Evaluation:  Dietary intake, exercise, and body weight. Follow up in 6 weeks for 8 month post-op visit.

## 2013-06-10 ENCOUNTER — Encounter (HOSPITAL_COMMUNITY): Payer: Medicare Other | Attending: Hematology and Oncology

## 2013-06-10 DIAGNOSIS — D509 Iron deficiency anemia, unspecified: Secondary | ICD-10-CM

## 2013-06-10 DIAGNOSIS — K219 Gastro-esophageal reflux disease without esophagitis: Secondary | ICD-10-CM | POA: Insufficient documentation

## 2013-06-10 DIAGNOSIS — G4733 Obstructive sleep apnea (adult) (pediatric): Secondary | ICD-10-CM | POA: Diagnosis not present

## 2013-06-10 DIAGNOSIS — E119 Type 2 diabetes mellitus without complications: Secondary | ICD-10-CM | POA: Diagnosis not present

## 2013-06-10 DIAGNOSIS — Z09 Encounter for follow-up examination after completed treatment for conditions other than malignant neoplasm: Secondary | ICD-10-CM | POA: Diagnosis not present

## 2013-06-10 DIAGNOSIS — Z9884 Bariatric surgery status: Secondary | ICD-10-CM | POA: Diagnosis not present

## 2013-06-10 LAB — CBC WITH DIFFERENTIAL/PLATELET
Basophils Absolute: 0 10*3/uL (ref 0.0–0.1)
Basophils Relative: 0 % (ref 0–1)
Eosinophils Absolute: 0.2 10*3/uL (ref 0.0–0.7)
Eosinophils Relative: 2 % (ref 0–5)
HCT: 45.1 % (ref 36.0–46.0)
Hemoglobin: 16 g/dL — ABNORMAL HIGH (ref 12.0–15.0)
Lymphocytes Relative: 21 % (ref 12–46)
Lymphs Abs: 2.1 10*3/uL (ref 0.7–4.0)
MCH: 31.4 pg (ref 26.0–34.0)
MCHC: 35.5 g/dL (ref 30.0–36.0)
MCV: 88.6 fL (ref 78.0–100.0)
Monocytes Absolute: 0.8 10*3/uL (ref 0.1–1.0)
Monocytes Relative: 8 % (ref 3–12)
Neutro Abs: 6.8 10*3/uL (ref 1.7–7.7)
Neutrophils Relative %: 69 % (ref 43–77)
Platelets: 244 10*3/uL (ref 150–400)
RBC: 5.09 MIL/uL (ref 3.87–5.11)
RDW: 11.7 % (ref 11.5–15.5)
WBC: 10 10*3/uL (ref 4.0–10.5)

## 2013-06-10 LAB — FERRITIN: Ferritin: 31 ng/mL (ref 10–291)

## 2013-06-10 NOTE — Progress Notes (Signed)
Carrie Cook presented for labwork. Labs per MD order drawn via Peripheral Line 23 gauge needle inserted in LT AC  Good blood return present. Procedure without incident.  Needle removed intact. Patient tolerated procedure well.

## 2013-06-11 ENCOUNTER — Encounter (HOSPITAL_BASED_OUTPATIENT_CLINIC_OR_DEPARTMENT_OTHER): Payer: Medicare Other

## 2013-06-11 ENCOUNTER — Encounter (HOSPITAL_COMMUNITY): Payer: Self-pay

## 2013-06-11 VITALS — BP 133/75 | HR 80 | Temp 97.9°F | Resp 18 | Wt 176.8 lb

## 2013-06-11 DIAGNOSIS — E119 Type 2 diabetes mellitus without complications: Secondary | ICD-10-CM | POA: Diagnosis not present

## 2013-06-11 DIAGNOSIS — G4733 Obstructive sleep apnea (adult) (pediatric): Secondary | ICD-10-CM | POA: Diagnosis not present

## 2013-06-11 DIAGNOSIS — D509 Iron deficiency anemia, unspecified: Secondary | ICD-10-CM

## 2013-06-11 DIAGNOSIS — K219 Gastro-esophageal reflux disease without esophagitis: Secondary | ICD-10-CM | POA: Diagnosis not present

## 2013-06-11 DIAGNOSIS — Z9884 Bariatric surgery status: Secondary | ICD-10-CM | POA: Diagnosis not present

## 2013-06-11 LAB — TSH: TSH: 0.731 u[IU]/mL (ref 0.350–4.500)

## 2013-06-11 NOTE — Patient Instructions (Signed)
[place] Discharge Instructions  RECOMMENDATIONS MADE BY THE CONSULTANT AND ANY TEST RESULTS WILL BE SENT TO YOUR REFERRING PHYSICIAN.  EXAM FINDINGS BY THE PHYSICIAN TODAY AND SIGNS OR SYMPTOMS TO REPORT TO CLINIC OR PRIMARY PHYSICIAN: Exam and findings as discussed by Dr. Barnet Glasgow.  Report increased fatigue or shortness of breath.  Will give you feraheme tomorrow to see if it will improve your symptoms.   INSTRUCTIONS/FOLLOW-UP: Follow-up in 3 months with labs and office visit.  Thank you for choosing [facility] to provide your oncology and hematology care.  To afford each patient quality time with our providers, please arrive at least 15 minutes before your scheduled appointment time.  With your help, our goal is to use those 15 minutes to complete the necessary work-up to ensure our physicians have the information they need to help with your evaluation and healthcare recommendations.    Effective January 1st, 2014, we ask that you re-schedule your appointment with our physicians should you arrive 10 or more minutes late for your appointment.  We strive to give you quality time with our providers, and arriving late affects you and other patients whose appointments are after yours.    Again, thank you for choosing [facility].  Our hope is that these requests will decrease the amount of time that you wait before being seen by our physicians.       _____________________________________________________________  Should you have questions after your visit to [facility], please contact our office at [phone] between the hours of 8:30 a.m. and 5:00 p.m.  Voicemails left after 4:30 p.m. will not be returned until the following business day.  For prescription refill requests, have your pharmacy contact our office with your prescription refill request.

## 2013-06-11 NOTE — Progress Notes (Signed)
[facility]  OFFICE PROGRESS Loyal Jacobson, MD [place] [address]  DIAGNOSIS: Iron deficiency anemia, unspecified  Morbid obesity  H/O gastric bypass  Chief Complaint  Patient presents with  . Iron deficiency  . That is post gastric bypass    CURRENT THERAPY: Feraheme on 11/13/2012.  INTERVAL HISTORY: Carrie Cook 53 y.o. female returns for followup of iron deficiency having received intravenous Feraheme in November 2014, 1 month after undergoing gastric bypass surgery prior to initiate had no menstrual periods for one year but has had recurrence of menstrual bleeding since. For the last 6 months she's had return of menstrual bleeding. Sometimes is continuous. She denies any fever, night sweats, but has been more fatigued of the past 2 months. She has successfully lost 35 pounds since November of 2014. Appetite is good. 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 She has tried hormonal therapy for regulation of her period without success. She also has endometriosis. Denies diarrhea.  MEDICAL HISTORY: Past Medical History  Diagnosis Date  . Hyperlipidemia     no current med.  . Obesity   . Gastroesophageal reflux disease   . Anxiety   . H/O hiatal hernia   . PONV (postoperative nausea and vomiting)   . Migraines   . Asthma     daily and prn inhalers  . Shortness of breath     with daily activities  . Lock jaw     jaw locks open if opens mouth wide  . History of endometriosis   . Insulin dependent diabetes mellitus   . Hypertension     under control with med., has been on med. x 2 yr.  . Palpitations   . Dental crowns present   . Family history of anesthesia complication     pt's mother and sister have hx. of post-op N/V  . Depression   . Sleep apnea     uses CPAP nightly SETTING IS 12  . Fibroids     UTERINE  . Neuropathy     FEET  . Degenerative joint disease      right knee, spine - STATES INTERMITTENT  NUMBNESS DOWN RT LEG WITH PROLONGED STANDING OR WALKING- THINKS RELATED TO HER SPINE PROBLEMS  . Anemia     PT HAS HAD COLONOSCOPY AND ENDOSCOPY WORK UP - NO PROBLEMS FOUND AS SOURCE OF ANEMIA -- PT HAD IRON INFUSION AT [facility] 11/13/12-AFTER SEEING HEMATOLOGIST DR. [place] AND HE GAVE HEMATOLOGIC CLEARANCE FOR GASTRIC BYPASS SURGERY.    INTERIM HISTORY: has ANEMIA-IRON DEFICIENCY; GERD; Chest pain; Hyperlipidemia; Type II or unspecified type diabetes mellitus with unspecified complication, uncontrolled; Obesity; Laboratory test; Nephrolithiasis; Asthma; Endometriosis; Hypertension; Obstructive sleep apnea; and Morbid obesity on her problem list.    ALLERGIES:  is allergic to codeine; prozac; dyazide; erythromycin; lisinopril; pravastatin; and sulfonamide derivatives.  MEDICATIONS: has a current medication list which includes the following prescription(s): albuterol, amlodipine, estradiol, insulin nph-regular human, lamotrigine, loratadine, medroxyprogesterone, metoprolol tartrate, multivitamin with minerals, relion insulin syringe 32ml/31g, venlafaxine xr, vitamin b-12, budesonide-formoterol, and calcium citrate-vitamin d.  SURGICAL HISTORY:  Past Surgical History  Procedure Laterality Date  . Tubal ligation  1993  . Pelvic laparoscopy  11/04/1999    with fulguration of endometriosis  . Colonoscopy  05/2009    HEN:IDPOEUMP hemorrhoids otherwise normal colon, rectum and terminal ileum  . Carpal tunnel release  01/03/2012  Procedure: CARPAL TUNNEL RELEASE;  Surgeon: Wynonia Sours, MD;  Location: [facility];  Service: Orthopedics;  Laterality: Right;  . Breath tek h pylori N/A 08/13/2012    Procedure: BREATH TEK H PYLORI;  Surgeon: Edward Jolly, MD;  Location: [facility];  Service: General;  Laterality: N/A;  . Laparoscopy      due to endometriosis  . Trigger finger release Right 09/24/2012    Procedure: RELEASE A-1 PULLEY OF  RIGHT THUMB;  Surgeon: Wynonia Sours, MD;  Location: [facility];  Service: Orthopedics;  Laterality: Right;  . Esophagogastroduodenoscopy  5/013/2011    FTD:DUKGUR esophagus/multiple polyps removed s/p (hyperplastic). Gastritis without H.pylori.  . Carpal tunnel release right hand Right 12/13  . Colonoscopy with esophagogastroduodenoscopy (egd) N/A 10/28/2012    Procedure: COLONOSCOPY WITH ESOPHAGOGASTRODUODENOSCOPY (EGD);  Surgeon: Daneil Dolin, MD;  [facility];  Service: Endoscopy;  Laterality: N/A;  7:30  . Givens capsule study N/A 10/28/2012    Procedure: GIVENS CAPSULE STUDY;  Surgeon: Daneil Dolin, MD;  [facility];  Service: Endoscopy;  Laterality: N/A;  . Gastric roux-en-y N/A 11/19/2012    Procedure: LAPAROSCOPIC ROUX-EN-Y GASTRIC;  Surgeon: Edward Jolly, MD;  Location: [facility];  Service: General;  Laterality: N/A;    FAMILY HISTORY: family history includes Anesthesia problems in her mother and sister; COPD in her mother; Cancer in her mother; Heart disease in her mother; Hyperlipidemia in her father; Hypertension in her father; Thyroid disease in her paternal uncle and sister.  SOCIAL HISTORY:  reports that she has never smoked. She has never used smokeless tobacco. She reports that she does not drink alcohol or use illicit drugs.  REVIEW OF SYSTEMS:  Other than that discussed above is noncontributory.  PHYSICAL EXAMINATION: ECOG PERFORMANCE STATUS: 1 - Symptomatic but completely ambulatory  Blood pressure 133/75, pulse 80, temperature 97.9 F (36.6 C), resp. rate 18, weight 176 lb 12.8 oz (80.196 kg), last menstrual period 09/20/2011.  GENERAL:alert, no distress and comfortable SKIN: skin color, texture, turgor are normal, no rashes or significant lesions EYES: PERLA; Conjunctiva are pink and non-injected, sclera clear SINUSES: No redness or tenderness over maxillary or ethmoid sinuses OROPHARYNX:no exudate, no erythema on lips,  buccal mucosa, or tongue. NECK: supple, thyroid normal size, non-tender, without nodularity. No masses CHEST: Normal AP diameter with no breast masses. LYMPH:  no palpable lymphadenopathy in the cervical, axillary or inguinal LUNGS: clear to auscultation and percussion with normal breathing effort HEART: regular rate & rhythm and no murmurs. ABDOMEN:abdomen soft, non-tender and normal bowel sounds MUSCULOSKELETAL:no cyanosis of digits and no clubbing. Range of motion normal.  NEURO: alert & oriented x 3 with fluent speech, no focal motor/sensory deficits   LABORATORY DATA: Infusion on 06/10/2013  Component Date Value Ref Range Status  . WBC 06/10/2013 10.0  4.0 - 10.5 K/uL Final  . RBC 06/10/2013 5.09  3.87 - 5.11 MIL/uL Final  . Hemoglobin 06/10/2013 16.0* 12.0 - 15.0 g/dL Final  . HCT 06/10/2013 45.1  36.0 - 46.0 % Final  . MCV 06/10/2013 88.6  78.0 - 100.0 fL Final  . MCH 06/10/2013 31.4  26.0 - 34.0 pg Final  . MCHC 06/10/2013 35.5  30.0 - 36.0 g/dL Final  . RDW 06/10/2013 11.7  11.5 - 15.5 % Final  . Platelets 06/10/2013 244  150 - 400 K/uL Final  . Neutrophils Relative % 06/10/2013 69  43 - 77 % Final  . Neutro Abs 06/10/2013  6.8  1.7 - 7.7 K/uL Final  . Lymphocytes Relative 06/10/2013 21  12 - 46 % Final  . Lymphs Abs 06/10/2013 2.1  0.7 - 4.0 K/uL Final  . Monocytes Relative 06/10/2013 8  3 - 12 % Final  . Monocytes Absolute 06/10/2013 0.8  0.1 - 1.0 K/uL Final  . Eosinophils Relative 06/10/2013 2  0 - 5 % Final  . Eosinophils Absolute 06/10/2013 0.2  0.0 - 0.7 K/uL Final  . Basophils Relative 06/10/2013 0  0 - 1 % Final  . Basophils Absolute 06/10/2013 0.0  0.0 - 0.1 K/uL Final  . Ferritin 06/10/2013 31  10 - 291 ng/mL Final   Performed at [facility]  . TSH 06/10/2013 0.731  0.350 - 4.500 uIU/mL Final   Comment: Please note change in reference range.                          Performed at [facility]: No new pathology.  Urinalysis No  results found for this basename: colorurine,  appearanceur,  labspec,  phurine,  glucoseu,  hgbur,  bilirubinur,  ketonesur,  proteinur,  urobilinogen,  nitrite,  leukocytesur    RADIOGRAPHIC STUDIES: No results found.  ASSESSMENT:  #1. Iron deficiency anemia previously due to the long-term proton pump inhibitor therapy, now status post gastric bypass with return of menses, heavy.  #2. Morbid obesity, status post gastric bypass surgery, tolerated well.  #3. Obstructive sleep apnea syndrome, using CPAP.  #4. Gastroesophageal reflux disease, stable.  #5. Diabetes mellitus, type II, non-insulin requiring, improved after surgery    PLAN:  #1. Intravenous Feraheme tomorrow. #2. Return in 3 months with CBC and ferritin.   All questions were answered. The patient knows to call the clinic with any problems, questions or concerns. We can certainly see the patient much sooner if necessary.   I spent 25 minutes counseling the patient face to face. The total time spent in the appointment was 30 minutes.    Farrel Gobble, MD 06/11/2013 11:54 AM  DISCLAIMER:  This note was dictated with voice recognition software.  Similar sounding words can inadvertently be transcribed inaccurately and may not be corrected upon review.

## 2013-06-12 ENCOUNTER — Encounter (HOSPITAL_BASED_OUTPATIENT_CLINIC_OR_DEPARTMENT_OTHER): Payer: Medicare Other

## 2013-06-12 VITALS — BP 142/94 | HR 92 | Temp 98.3°F | Resp 18

## 2013-06-12 DIAGNOSIS — D509 Iron deficiency anemia, unspecified: Secondary | ICD-10-CM | POA: Diagnosis present

## 2013-06-12 MED ORDER — SODIUM CHLORIDE 0.9 % IV SOLN
Freq: Once | INTRAVENOUS | Status: AC
Start: 2013-06-12 — End: 2013-06-12
  Administered 2013-06-12: 10:00:00 via INTRAVENOUS

## 2013-06-12 MED ORDER — SODIUM CHLORIDE 0.9 % IV SOLN
1020.0000 mg | Freq: Once | INTRAVENOUS | Status: AC
  Administered 2013-06-12: 1020 mg via INTRAVENOUS
  Filled 2013-06-12: qty 34

## 2013-06-12 MED ORDER — SODIUM CHLORIDE 0.9 % IJ SOLN
10.0000 mL | INTRAMUSCULAR | Status: DC | PRN
  Administered 2013-06-12: 10 mL

## 2013-06-12 NOTE — Progress Notes (Signed)
Tolerated well

## 2013-06-19 ENCOUNTER — Ambulatory Visit (INDEPENDENT_AMBULATORY_CARE_PROVIDER_SITE_OTHER): Payer: Medicare Other | Admitting: Obstetrics & Gynecology

## 2013-06-19 ENCOUNTER — Encounter: Payer: Self-pay | Admitting: Obstetrics & Gynecology

## 2013-06-19 VITALS — BP 130/80 | Ht 63.0 in | Wt 176.0 lb

## 2013-06-19 DIAGNOSIS — N920 Excessive and frequent menstruation with regular cycle: Secondary | ICD-10-CM

## 2013-06-19 MED ORDER — MEGESTROL ACETATE 40 MG PO TABS: ORAL_TABLET | ORAL | Status: DC

## 2013-06-19 NOTE — Progress Notes (Signed)
Patient ID: Carrie Cook, female   DOB: 1960/06/06, 53 y.o.   MRN: [id number] Pt has been having heavy regular periods lasting 9 days and this last period of time, 2 in a month, heavy 9 days of bleeding with "goo" Has been on provera and estradiol 2 mg for menopausal symptoms S/p Roux-en-Y laparoscopic bypass in October 2014 Will use megestrol to control cycles, sonogram in 2 weeks to evaluate endometrium  No burning with urination, frequency or urgency No nausea, vomiting or diarrhea Nor fever chills or other constitutional symptoms  Past Medical History  Diagnosis Date  . Hyperlipidemia     no current med.  . Obesity   . Gastroesophageal reflux disease   . Anxiety   . H/O hiatal hernia   . PONV (postoperative nausea and vomiting)   . Migraines   . Asthma     daily and prn inhalers  . Shortness of breath     with daily activities  . Lock jaw     jaw locks open if opens mouth wide  . History of endometriosis   . Insulin dependent diabetes mellitus   . Hypertension     under control with med., has been on med. x 2 yr.  . Palpitations   . Dental crowns present   . Family history of anesthesia complication     pt's mother and sister have hx. of post-op N/V  . Depression   . Sleep apnea     uses CPAP nightly SETTING IS 12  . Fibroids     UTERINE  . Neuropathy     FEET  . Degenerative joint disease     right knee, spine - STATES INTERMITTENT  NUMBNESS DOWN RT LEG WITH PROLONGED STANDING OR WALKING- THINKS RELATED TO HER SPINE PROBLEMS  . Anemia     PT HAS HAD COLONOSCOPY AND ENDOSCOPY WORK UP - NO PROBLEMS FOUND AS SOURCE OF ANEMIA -- PT HAD IRON INFUSION AT [facility] 11/13/12-AFTER SEEING HEMATOLOGIST DR. [place] AND HE GAVE HEMATOLOGIC CLEARANCE FOR GASTRIC BYPASS SURGERY.    Past Surgical History  Procedure Laterality Date  . Tubal ligation  1993  . Pelvic laparoscopy  11/04/1999    with fulguration of endometriosis  . Colonoscopy  05/2009    ATF:TDDUKGUR  hemorrhoids otherwise normal colon, rectum and terminal ileum  . Carpal tunnel release  01/03/2012    Procedure: CARPAL TUNNEL RELEASE;  Surgeon: Wynonia Sours, MD;  Location: [facility];  Service: Orthopedics;  Laterality: Right;  . Breath tek h pylori N/A 08/13/2012    Procedure: BREATH TEK H PYLORI;  Surgeon: Edward Jolly, MD;  [facility];  Service: General;  Laterality: N/A;  . Laparoscopy      due to endometriosis  . Trigger finger release Right 09/24/2012    Procedure: RELEASE A-1 PULLEY OF RIGHT THUMB;  Surgeon: Wynonia Sours, MD;  Location: [facility];  Service: Orthopedics;  Laterality: Right;  . Esophagogastroduodenoscopy  5/013/2011    [facility]:CWCBJS esophagus/multiple polyps removed s/p (hyperplastic). Gastritis without H.pylori.  . Carpal tunnel release right hand Right 12/13  . Colonoscopy with esophagogastroduodenoscopy (egd) N/A 10/28/2012    Procedure: COLONOSCOPY WITH ESOPHAGOGASTRODUODENOSCOPY (EGD);  Surgeon: Daneil Dolin, MD;  [facility];  Service: Endoscopy;  Laterality: N/A;  7:30  . Givens capsule study N/A 10/28/2012    Procedure: GIVENS CAPSULE STUDY;  Surgeon: Daneil Dolin, MD;  [facility];  Service: Endoscopy;  Laterality:  N/A;  . Gastric roux-en-y N/A 11/19/2012    Procedure: LAPAROSCOPIC ROUX-EN-Y GASTRIC;  Surgeon: Edward Jolly, MD;  [facility];  Service: General;  Laterality: N/A;    OB History   Grav Para Term Preterm Abortions TAB SAB Ect Mult Living   1 1              Allergies  Allergen Reactions  . Codeine Nausea And Vomiting  . Prozac [Fluoxetine Hcl] Hives  . Dyazide [Hydrochlorothiazide W-Triamterene] Cough  . Erythromycin Nausea Only  . Lisinopril Cough  . Pravastatin Other (See Comments)    BODY ACHES, FLU-LIKE SX.  . Sulfonamide Derivatives Rash    History   Social History  . Marital Status: Married    Spouse Name: N/A    Number of Children: 1  .  Years of Education: N/A   Occupational History  . Application for disability pending    Social History Main Topics  . Smoking status: Never Smoker   . Smokeless tobacco: Never Used  . Alcohol Use: No  . Drug Use: No  . Sexual Activity: Yes   Other Topics Concern  . None   Social History Narrative  . None    Family History  Problem Relation Age of Onset  . Hypertension Father   . Hyperlipidemia Father   . COPD Mother   . Heart disease Mother   . Cancer Mother     Cervix  . Anesthesia problems Mother     post-op N/V  . Thyroid disease Sister   . Anesthesia problems Sister     post-op N/V  . Thyroid disease Paternal Uncle

## 2013-07-02 NOTE — Telephone Encounter (Signed)
Holley please see if you can get this encounter to close, it says there is pending medications.tmj

## 2013-07-03 ENCOUNTER — Ambulatory Visit (INDEPENDENT_AMBULATORY_CARE_PROVIDER_SITE_OTHER): Payer: Medicare Other | Admitting: Obstetrics & Gynecology

## 2013-07-03 ENCOUNTER — Ambulatory Visit (INDEPENDENT_AMBULATORY_CARE_PROVIDER_SITE_OTHER): Payer: Medicare Other

## 2013-07-03 ENCOUNTER — Other Ambulatory Visit: Payer: Self-pay | Admitting: Obstetrics & Gynecology

## 2013-07-03 ENCOUNTER — Encounter: Payer: Self-pay | Admitting: Obstetrics & Gynecology

## 2013-07-03 VITALS — BP 122/80 | Wt 177.0 lb

## 2013-07-03 DIAGNOSIS — N920 Excessive and frequent menstruation with regular cycle: Secondary | ICD-10-CM | POA: Diagnosis not present

## 2013-07-03 DIAGNOSIS — D259 Leiomyoma of uterus, unspecified: Secondary | ICD-10-CM

## 2013-07-03 NOTE — Progress Notes (Signed)
Patient ID: Carrie Cook, female   DOB: 03/04/1960, 53 y.o.   MRN: [id number] Blood pressure 122/80, weight 177 lb (80.287 kg), last menstrual period 06/12/2013.  US Transvaginal Non-ob  07/03/2013   GYNECOLOGIC SONOGRAM   Carrie Cook is a 52 y.o. G1P1 LMP-06/12/2013 for a pelvic sonogram for  menorrhagia and fibroids. Pt is s/p BTL  Uterus                      11.2 x 8.1 x 7.9 cm, anteverted with multiple  fibroids (slight increase in size of fibroids noted since 04/2011 u/s)  largest 2 fibroids=7.4 & 5.4cm  Endometrium          9.5 mm, symmetrical, slightly distorted by fibroids  Right ovary             2.3 x 1.6 x 1.6 cm,   Left ovary                2.5 x 1.5 x 1.5 cm,   No free fluid or adnexal masses noted within the pelvis  Technician Comments:  Anteverted uterus noted with multiple fibroids noted, (increase in size of  fibroids since 04/2011 u/s), bilateral ovaries appear WNL, no free fluid or  adnexal masses noted within the pelvis   Lazarus Gowda 07/03/2013 11:49 AM  Fibroid uterus, normal endometrium in menstruating woman Normal ovaries  Tacy Chavis H 07/03/2013 12:21 PM     Pt bleeding has stopped on the megestrol having some vasomotor symptoms however, on estradiol  Sonogram is reviewed with pt, all questions answered  ROS No burning with urination, frequency or urgency No nausea, vomiting or diarrhea Nor fever chills or other constitutional symptoms  No exam  Assessment:pLan Perimenopausal menometrorrhgia Fibroids  Will continue on megestrol to control cycles, wean down to 20 mg Follow up in 6 weeks

## 2013-07-20 ENCOUNTER — Other Ambulatory Visit: Payer: Self-pay | Admitting: Obstetrics & Gynecology

## 2013-07-28 ENCOUNTER — Ambulatory Visit: Payer: Medicare Other | Admitting: Dietician

## 2013-08-11 DIAGNOSIS — F339 Major depressive disorder, recurrent, unspecified: Secondary | ICD-10-CM | POA: Diagnosis not present

## 2013-08-14 ENCOUNTER — Ambulatory Visit: Payer: Medicare Other | Admitting: Obstetrics & Gynecology

## 2013-08-17 ENCOUNTER — Other Ambulatory Visit: Payer: Self-pay | Admitting: Family Medicine

## 2013-08-21 ENCOUNTER — Ambulatory Visit (INDEPENDENT_AMBULATORY_CARE_PROVIDER_SITE_OTHER): Payer: Medicare Other | Admitting: Obstetrics & Gynecology

## 2013-08-21 ENCOUNTER — Encounter: Payer: Self-pay | Admitting: Obstetrics & Gynecology

## 2013-08-21 VITALS — BP 118/80 | Wt 177.0 lb

## 2013-08-21 DIAGNOSIS — N920 Excessive and frequent menstruation with regular cycle: Secondary | ICD-10-CM

## 2013-08-21 DIAGNOSIS — D259 Leiomyoma of uterus, unspecified: Secondary | ICD-10-CM

## 2013-08-21 DIAGNOSIS — D219 Benign neoplasm of connective and other soft tissue, unspecified: Secondary | ICD-10-CM | POA: Insufficient documentation

## 2013-08-21 NOTE — Progress Notes (Signed)
Patient ID: Carrie Cook, female   DOB: [birth year], 53 y.o.   MRN: [id number] Doing well on Megestrol 20 and estrace 2 mg No bleeding at ll Continue on this regimen Follow up 6 months  No exam  Blood pressure 118/80, weight 177 lb (80.287 kg), last menstrual period 06/12/2013.

## 2013-09-01 ENCOUNTER — Encounter: Payer: Medicare Other | Attending: General Surgery | Admitting: Dietician

## 2013-09-01 DIAGNOSIS — Z713 Dietary counseling and surveillance: Secondary | ICD-10-CM | POA: Insufficient documentation

## 2013-09-01 NOTE — Patient Instructions (Addendum)
Goals:  Follow Phase 3B: High Protein + Non-Starchy Vegetables  Eat 3-6 small meals/snacks, every 3-5 hrs  Aim to get 60-80 g protein per day  Have 2-3 oz protein at meals and 1-2 oz of protein at snacks if you are hungry  Increase fluid intake to 64oz + (mostly water)  Limit caffeinated coffee to 1 cup a day then switch to decaf  Avoid drinking 15 minutes before, during and 30 minutes after eating  Aim for >30 min of physical activity daily  Talk to your doctor about heartbun.   For a snack, try having a small amount of fruit (15 g) with nuts or cheese or cottage cheese  Think about adding a protein shake each day  Think about contacting Everardo Beals (social worker) to help emotional eating [phone]

## 2013-09-01 NOTE — Progress Notes (Signed)
Follow-up visit:  10 Months Post-Operative RYGB Surgery  Medical Nutrition Therapy:  Appt start time: 200 end time:  230.  Primary concerns today: Post-operative Bariatric Surgery Nutrition Management. Carrie Cook returns today with a 1 lbs weight loss. Weight had been down to 171 lbs but has regained weight since last time. States that eating "has gotten bad". Having a lot of problems with depression again and is seeing a doctor for that every few mothers. Having stress at home. Starting having ice cream, eating out more, adding some fries or hush puppies. Feels like she is eating "all day long". On the go a lot with grandson during the day. Husband is not being supportive. 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   Trying to have less yogurt some days less, but may have up to 4. Having some cottage cheese with fruit now. Blood sugar is going up. Still having heart burn but is due to see her primary care doctor.   Surgery date: 10/21/12 Surgery type: RYGB Start weight at [facility]: 233.0 lbs (08/12/12) Weight today: 176.5 lbs  Weight lost since last visit: 1 lbs Total weight lost: 56.5 lbs  Weight loss goal: 145 lbs % weight goal achieved: 64%   TANITA  BODY COMP RESULTS  09/26/12 12/03/12 01/21/13 03/06/13 06/04/13 09/01/13   BMI (kg/m^2) 42.5 38.2 35.3 33.0 31.4 31.3   Fat Mass (lbs) 111.5 98.5 91.0 80.5 73.5 71.5   Fat Free Mass (lbs) 128.5 117.0 108.0 106.0 104.0 105.0   Total Body Water (lbs) 94.0 85.5 79.0 77.5 76.0 77.0    Preferred Learning Style:  No preference indicated   Learning Readiness:   Ready  24-hr recall: B (AM): cottage cheese and fruit or egg or yogurt (12 g protein)  Snk (AM): yogurt (12 g) or frozen banana in chocolate or fruit L (PM): fat free refried beans with cheese (15 g) may skip Snk (PM): yogurt  (12) or cottage cheese D (PM): lean cuisine [place] with green beans or fat free refried beans with cheese (15 g) or 4 oz steak (28g) Snk (PM): yogurt or cheese or  cheese stick (12-17)  Fluid intake: 1 water bottle, milk sometimes, 4 cups coffee, (total at least 64 fl oz) (not consistent) Estimated total protein intake: having at least 60 g protein (less than before)  Medications: insulin Supplementation: stopped taking calcium since she eating a lot of yogurt and cottage   Average CBG per patient: 150 mg/dl fasting testing once per week Last patient reported A1c: 5.7% April  Using straws: If she gets an iced coffee Drinking while eating: If she goes out to eat has a little water Hair loss: Yes - has gotten worse (started hormones and colored hair) Carbonated beverages: No N/V/D/C: nausea and vomiting when food doesn't settle right (when under stressor in a hurry), constipation not as frequent Dumping syndrome: No  Recent physical activity:  Walking, yard work, housework, taking care of horses (overall active)  Progress Towards Goal(s):  In progress.    Nutritional Diagnosis:  [place]-3.3 Overweight/obesity related to past poor dietary habits and physical inactivity as evidenced by patient w/ recent RYGB surgery following dietary guidelines for continued weight loss.    Intervention:  Nutrition education/diet enforcement.   Teaching Method Utilized:  Visual Auditory  Barriers to learning/adherence to lifestyle change: still having a lot of yogurts per day  Demonstrated degree of understanding via:  Teach Back   Monitoring/Evaluation:  Dietary intake, exercise, and body weight. Follow up in 2 months  for 10 month post-op visit.

## 2013-09-05 ENCOUNTER — Telehealth: Payer: Self-pay | Admitting: Cardiovascular Disease

## 2013-09-05 NOTE — Telephone Encounter (Signed)
Pt has not been seen since 2013, have given courtesy refills before and has not made fu apt,refill denied,needs to establish care again

## 2013-09-05 NOTE — Telephone Encounter (Signed)
Received fax refill request  Rx # [id number] Medication:  Amlodipine Besylate 5 mg tablets Qty 60 Sig:  Take one by mouth every morning Physician:  Bronson Ing

## 2013-09-09 ENCOUNTER — Other Ambulatory Visit (HOSPITAL_COMMUNITY): Payer: Medicare Other

## 2013-09-10 ENCOUNTER — Ambulatory Visit (HOSPITAL_COMMUNITY): Payer: Medicare Other

## 2013-09-11 NOTE — Progress Notes (Signed)
This encounter was created in error - please disregard.

## 2013-09-29 ENCOUNTER — Other Ambulatory Visit: Payer: Self-pay | Admitting: Obstetrics & Gynecology

## 2013-10-04 ENCOUNTER — Other Ambulatory Visit: Payer: Self-pay | Admitting: Adult Health

## 2013-10-09 ENCOUNTER — Other Ambulatory Visit: Payer: Self-pay | Admitting: Adult Health

## 2013-10-15 ENCOUNTER — Encounter (HOSPITAL_COMMUNITY): Payer: Self-pay | Admitting: Hematology and Oncology

## 2013-10-16 ENCOUNTER — Encounter (HOSPITAL_COMMUNITY): Payer: Self-pay | Admitting: Hematology and Oncology

## 2013-10-16 ENCOUNTER — Other Ambulatory Visit (HOSPITAL_COMMUNITY): Payer: Self-pay | Admitting: Hematology and Oncology

## 2013-10-16 DIAGNOSIS — D509 Iron deficiency anemia, unspecified: Secondary | ICD-10-CM | POA: Insufficient documentation

## 2013-10-21 ENCOUNTER — Other Ambulatory Visit: Payer: Self-pay | Admitting: *Deleted

## 2013-10-21 MED ORDER — METOPROLOL TARTRATE 25 MG PO TABS: ORAL_TABLET | ORAL | Status: DC

## 2013-10-23 ENCOUNTER — Telehealth: Payer: Self-pay | Admitting: Obstetrics & Gynecology

## 2013-10-23 NOTE — Telephone Encounter (Signed)
Pt states Dr.Eure prescribed Megace 40 mg for abnormal vaginal bleeding, starting bleeding again 14 days ago, has been taking Megace 40 mg x 3 tablets daily x 1 week with no improvement. Please advise.

## 2013-10-24 NOTE — Telephone Encounter (Signed)
Pt informed to continue the Megestrol 3 a day, call transferred to front staff for an appt to be scheduled next week with Dr. Elonda Husky.

## 2013-10-24 NOTE — Telephone Encounter (Signed)
She needs to stay on the megestrol 3 a day and make an appointment to see me next week

## 2013-10-27 ENCOUNTER — Ambulatory Visit (INDEPENDENT_AMBULATORY_CARE_PROVIDER_SITE_OTHER): Payer: Medicare Other | Admitting: Obstetrics & Gynecology

## 2013-10-27 ENCOUNTER — Encounter: Payer: Self-pay | Admitting: Obstetrics & Gynecology

## 2013-10-27 ENCOUNTER — Telehealth: Payer: Self-pay | Admitting: Adult Health

## 2013-10-27 VITALS — BP 118/70 | Wt 183.0 lb

## 2013-10-27 DIAGNOSIS — N920 Excessive and frequent menstruation with regular cycle: Secondary | ICD-10-CM

## 2013-10-27 MED ORDER — MEDROXYPROGESTERONE ACETATE 150 MG/ML IM SUSP: INTRAMUSCULAR | Status: DC

## 2013-10-27 NOTE — Telephone Encounter (Signed)
Pt states was to come in today for depo provera injections, is not going to be able to come requesting to come tomorrow to get injection. Pt place on tech schedule for 10 am.

## 2013-10-27 NOTE — Progress Notes (Signed)
Patient ID: Carrie Cook, female   DOB: [birth year], 53 y.o.   MRN: [id number] Chief Complaint  Patient presents with  . GYN VISIT    vaginal bleeding x16 day. today spotting.    HPI The patient is in today complaining of 16 consecutive days of bleeding times heavy sometimes light today just spotting Some cramping associated So I went back and reviewed Carrie Cook is sonogram which is consistent with non-endometrial distorting fibroid which should not be a factor in this Were wondering if maybe her absorption the megestrol is diminished now she's had a Roux-en-Y gastric bypass We talked about options including endometrial ablation \\Certainly  she is knocking him the door menopause and is even having hot flashes as well we have her on Estrace We will give 300 mg of Depo-Provera a shot and see if that we'll better manage her bleeding ROS No burning with urination, frequency or urgency No nausea, vomiting or diarrhea Nor fever chills or other constitutional symptoms   Blood pressure 118/70, weight 183 lb (83.008 kg), last menstrual period 06/12/2013.  EXAM No exam  Assessment/Plan:  Menometrorrhagia with associated dysmenorrhea Fibroid Perimenopausal  We'll give Depo-Provera 300 mg IM continue on Megace 20 mg daily Followup in 3 months for sending appropriate response

## 2013-10-28 ENCOUNTER — Ambulatory Visit (INDEPENDENT_AMBULATORY_CARE_PROVIDER_SITE_OTHER): Payer: Medicare Other | Admitting: Adult Health

## 2013-10-28 ENCOUNTER — Encounter: Payer: Self-pay | Admitting: Adult Health

## 2013-10-28 DIAGNOSIS — Z3202 Encounter for pregnancy test, result negative: Secondary | ICD-10-CM

## 2013-10-28 DIAGNOSIS — N920 Excessive and frequent menstruation with regular cycle: Secondary | ICD-10-CM

## 2013-10-28 DIAGNOSIS — Z7689 Persons encountering health services in other specified circumstances: Secondary | ICD-10-CM

## 2013-10-28 LAB — POCT URINE PREGNANCY: Preg Test, Ur: NEGATIVE

## 2013-10-28 MED ORDER — MEDROXYPROGESTERONE ACETATE 150 MG/ML IM SUSP
150.0000 mg | Freq: Once | INTRAMUSCULAR | Status: AC
  Administered 2013-10-28: 150 mg via INTRAMUSCULAR

## 2013-10-30 ENCOUNTER — Other Ambulatory Visit: Payer: Self-pay | Admitting: Family Medicine

## 2013-11-03 ENCOUNTER — Ambulatory Visit: Payer: Medicare Other | Admitting: Dietician

## 2013-11-03 DIAGNOSIS — F339 Major depressive disorder, recurrent, unspecified: Secondary | ICD-10-CM | POA: Diagnosis not present

## 2013-11-07 ENCOUNTER — Other Ambulatory Visit: Payer: Self-pay

## 2013-11-24 ENCOUNTER — Encounter: Payer: Self-pay | Admitting: Adult Health

## 2013-11-27 DIAGNOSIS — K912 Postsurgical malabsorption, not elsewhere classified: Secondary | ICD-10-CM | POA: Diagnosis not present

## 2013-11-27 DIAGNOSIS — Z9884 Bariatric surgery status: Secondary | ICD-10-CM | POA: Diagnosis not present

## 2013-11-28 ENCOUNTER — Telehealth: Payer: Self-pay | Admitting: Obstetrics & Gynecology

## 2013-11-28 NOTE — Telephone Encounter (Signed)
Pt states had taken 2 depo injections and Megace 20 mg daily for vaginal bleeding starting bleeding again 11/15/2013 and is now heavy. Please advise.

## 2013-12-01 NOTE — Telephone Encounter (Signed)
Pt informed of Dr. Elonda Husky recommendation and to call office back if vaginal bleeding does not stop. Pt verbalized understanding.

## 2013-12-01 NOTE — Telephone Encounter (Signed)
Tell her to take 3 megestrol a day and see if that will stop it, if not call me back

## 2013-12-02 ENCOUNTER — Other Ambulatory Visit: Payer: Self-pay | Admitting: Family Medicine

## 2013-12-03 ENCOUNTER — Encounter: Payer: Medicare Other | Attending: General Surgery | Admitting: Dietician

## 2013-12-03 DIAGNOSIS — Z6832 Body mass index (BMI) 32.0-32.9, adult: Secondary | ICD-10-CM | POA: Diagnosis not present

## 2013-12-03 DIAGNOSIS — Z713 Dietary counseling and surveillance: Secondary | ICD-10-CM | POA: Diagnosis not present

## 2013-12-03 NOTE — Progress Notes (Signed)
  Follow-up visit:  14 Months Post-Operative RYGB Surgery  Medical Nutrition Therapy:  Appt start time: 800 end time:  830.  Primary concerns today: Post-operative Bariatric Surgery Nutrition Management. Carrie Cook returns today with a 5 lb weight gain. Has been feeling depressed lately. Having a lot of stress at home. Husband eats a lot of junk food and does not want her to spend money on foods she needs to eat for her diet.   Has been eating chocolate and cookies. Has been having some irregular menstural bleeding which she is being treated for.   Goes to see her doctor every 3 months to a nurse practitioner [facility] the [facility]. Has not talked to a counselor about stress/emotional eating yet.   Surgery date: 10/21/12 Surgery type: RYGB Start weight at [facility]: 233.0 lbs (08/12/12) Weight today: 181.5 lbs  Weight change: 5 lb gain Total weight lost: 51.5 lbs  Weight loss goal: 145 lbs % weight goal achieved: 59%   TANITA  BODY COMP RESULTS  09/26/12 12/03/12 01/21/13 03/06/13 06/04/13 09/01/13 12/03/13   BMI (kg/m^2) 42.5 38.2 35.3 33.0 31.4 31.3 32.2   Fat Mass (lbs) 111.5 98.5 91.0 80.5 73.5 71.5 80.5   Fat Free Mass (lbs) 128.5 117.0 108.0 106.0 104.0 105.0 101.0   Total Body Water (lbs) 94.0 85.5 79.0 77.5 76.0 77.0 74.0    Preferred Learning Style:  No preference indicated   Learning Readiness:   Ready  24-hr recall: B (AM): 1 cup cottage cheese and fruit (26 g protein)  Snk (AM): yogurt (12 g) or frozen banana in chocolate or fruit L (PM):peanut butter crackers, or wrap Snk (PM): yogurt  (12) or cottage cheese D (PM): lean cuisine [place] with green beans or 4 oz protein (17-28g) Snk (PM):1 cup cottage cheese (26 g)  Fluid intake: 1 water, 4 cups coffee (decaf and regular), protein shake (EAS Advantage) (total at least 64 fl oz) (not consistent) Estimated total protein intake: having at least 60 g+ protein  Medications: insulin Supplementation: stopped taking calcium  since she eating a lot of yogurt and cottage   Average CBG per patient: not testing Last patient reported A1c: 5.7% April  Using straws:No Drinking while eating: If she goes out to eat has a little water Hair loss: Yes - has gotten worse (started hormones and colored hair) Carbonated beverages: No N/V/D/C: nausea and vomiting when food doesn't settle right (when she doesn't chew well or eats quickly) Dumping syndrome: No  Recent physical activity:  Walking, yard work, housework, taking care of horses (overall active)  Progress Towards Goal(s):  In progress.    Nutritional Diagnosis:  [place]-3.3 Overweight/obesity related to past poor dietary habits and physical inactivity as evidenced by patient w/ recent RYGB surgery following dietary guidelines for continued weight loss.    Intervention:  Nutrition education/diet enforcement.   Teaching Method Utilized:  Visual Auditory  Barriers to learning/adherence to lifestyle change: stress, emotional eating  Demonstrated degree of understanding via:  Teach Back   Monitoring/Evaluation:  Dietary intake, exercise, and body weight. Follow up in 2 months for 16 month post-op visit.

## 2013-12-03 NOTE — Patient Instructions (Addendum)
Goals:  Follow Phase 3B: High Protein + Non-Starchy Vegetables  Eat 3-6 small meals/snacks, every 3-5 hrs  Aim to get 60-80 g protein per day  Have 2-3 oz protein at meals and 1-2 oz of protein at snacks if you are hungry  Increase fluid intake to 64oz + (mostly water)  Limit caffeinated coffee to 1 cup a day then switch to decaf  Talk to your doctor about not sleeping through the night and heartburn  Avoid drinking 15 minutes before, during and 30 minutes after eating  Aim for >30 min of physical activity daily  For a snack, try having a small amount of fruit (15 g) with nuts or cheese or cottage cheese  Think about adding a protein shake each day  Think about contacting Carrie Cook (social worker) to help emotional eating [phone]

## 2013-12-04 ENCOUNTER — Other Ambulatory Visit (INDEPENDENT_AMBULATORY_CARE_PROVIDER_SITE_OTHER): Payer: Self-pay | Admitting: *Deleted

## 2013-12-04 DIAGNOSIS — Z9884 Bariatric surgery status: Secondary | ICD-10-CM | POA: Diagnosis not present

## 2013-12-04 DIAGNOSIS — Z6841 Body Mass Index (BMI) 40.0 and over, adult: Secondary | ICD-10-CM | POA: Diagnosis not present

## 2013-12-05 ENCOUNTER — Other Ambulatory Visit: Payer: Self-pay | Admitting: Family Medicine

## 2013-12-05 NOTE — Telephone Encounter (Signed)
Patient has office visit next week for diabetic check up. Rx sent electronically to pharmacy. Patient notified.

## 2013-12-05 NOTE — Telephone Encounter (Signed)
Please send this in today please, she has 2 days left.

## 2013-12-11 ENCOUNTER — Ambulatory Visit (INDEPENDENT_AMBULATORY_CARE_PROVIDER_SITE_OTHER): Payer: Medicare Other | Admitting: Family Medicine

## 2013-12-11 ENCOUNTER — Other Ambulatory Visit: Payer: Self-pay | Admitting: Family Medicine

## 2013-12-11 ENCOUNTER — Encounter: Payer: Self-pay | Admitting: Family Medicine

## 2013-12-11 VITALS — BP 142/96 | Ht 63.0 in | Wt 185.0 lb

## 2013-12-11 DIAGNOSIS — E785 Hyperlipidemia, unspecified: Secondary | ICD-10-CM | POA: Diagnosis not present

## 2013-12-11 DIAGNOSIS — E118 Type 2 diabetes mellitus with unspecified complications: Secondary | ICD-10-CM | POA: Diagnosis not present

## 2013-12-11 DIAGNOSIS — I10 Essential (primary) hypertension: Secondary | ICD-10-CM | POA: Diagnosis not present

## 2013-12-11 DIAGNOSIS — Z23 Encounter for immunization: Secondary | ICD-10-CM | POA: Diagnosis not present

## 2013-12-11 MED ORDER — BECLOMETHASONE DIPROPIONATE 80 MCG/ACT IN AERS: 2.0000 | INHALATION_SPRAY | Freq: Two times a day (BID) | RESPIRATORY_TRACT | Status: DC

## 2013-12-11 MED ORDER — ALBUTEROL SULFATE HFA 108 (90 BASE) MCG/ACT IN AERS: 2.0000 | INHALATION_SPRAY | Freq: Four times a day (QID) | RESPIRATORY_TRACT | Status: DC | PRN

## 2013-12-11 MED ORDER — LOSARTAN POTASSIUM 50 MG PO TABS: 50.0000 mg | ORAL_TABLET | Freq: Every day | ORAL | Status: DC

## 2013-12-11 NOTE — Progress Notes (Signed)
   Subjective:    Patient ID: Carrie Cook, female    DOB: 08-15-1960, 53 y.o.   MRN: [id number]  Diabetes She presents for her follow-up diabetic visit. She has type 2 diabetes mellitus. Hypoglycemia symptoms include nervousness/anxiousness and tremors. Associated symptoms include fatigue and visual change. Current diabetic treatment includes insulin injections. She is compliant with treatment most of the time. She is currently taking insulin pre-breakfast and at bedtime. Blood glucose monitoring compliance is poor. Her overall blood glucose range is 140-180 mg/dl. She does not see a podiatrist.Eye exam is not current.    Pt said she went to [facility] 2 weeks ago and they drew blood. They checked her A1C. I do not see the results. Her GI doctor ordered the tests.   Patient has history hyperlipidemia she states she could be eating better she states she could be more physically active  Patient has history of hypertension she ran out of her medicines. She was instructed that she needs to follow up more often than just once a year. Review of Systems  Constitutional: Positive for fatigue.  Neurological: Positive for tremors.  Psychiatric/Behavioral: The patient is nervous/anxious.        Objective:   Physical Exam Neck no masses Lungs are clear no crackles Heart regular Pulse normal Blood pressure recheck Extremities no edema Diabetic foot exam completed  This patient states she's RD had lab work she does not want to do A1c today unfortunately it makes it very difficult to guide her.     Assessment & Plan:  Diabetes-I stressed to her the importance of healthy eating also stressed to her the importance of getting her insulin adjusted. She will send [place] some readings. She assures [place] that she had lab work completed she will figure out where these lab results when and she will make sure that we get a copy of this if for some reason she can't she agrees to go get her lab work.  This  patient has dysfunctional uterine bleeding hopefully they can get it under control with proper ablation  Hyperlipidemia await the results of recent testing  HTN the importance of dietary measures exercise discuss, she is ran out of her medicine I recommend losartan this will help protect her kidneys she needs a follow-up again within several weeks, plus follow-up by March.  Dietary measures were discussed. Flu vaccine pneumonia vaccine today

## 2013-12-22 ENCOUNTER — Telehealth: Payer: Self-pay | Admitting: Obstetrics & Gynecology

## 2013-12-22 MED ORDER — MEGESTROL ACETATE 40 MG PO TABS: ORAL_TABLET | ORAL | Status: DC

## 2013-12-22 NOTE — Telephone Encounter (Signed)
Spoke with pt letting her know Megace was sent to pharmacy. [place]

## 2013-12-26 DIAGNOSIS — E119 Type 2 diabetes mellitus without complications: Secondary | ICD-10-CM | POA: Diagnosis not present

## 2013-12-26 LAB — HM DIABETES EYE EXAM

## 2013-12-30 ENCOUNTER — Telehealth: Payer: Self-pay | Admitting: Family Medicine

## 2013-12-30 NOTE — Telephone Encounter (Signed)
Pt dropping off Labs done via another doc  See chart

## 2013-12-31 ENCOUNTER — Other Ambulatory Visit: Payer: Self-pay | Admitting: Family Medicine

## 2013-12-31 NOTE — Telephone Encounter (Signed)
Please let the patient know I reviewed over the labs that she sent. Her A1c is mildly elevated at 6.5. Please find out from the patient is she taking insulin currently? Issue trying to do a good job of watching the starches in her diet? It is possible she may have to be on metformin. She once was on metformin. I do not see that she is currently on it. I also don't see if she ever had a problem with metformin. Please discuss with her these aspects then send me further information thank you

## 2014-01-01 ENCOUNTER — Other Ambulatory Visit: Payer: Self-pay | Admitting: Family Medicine

## 2014-01-01 ENCOUNTER — Other Ambulatory Visit: Payer: Self-pay | Admitting: *Deleted

## 2014-01-01 DIAGNOSIS — Z79899 Other long term (current) drug therapy: Secondary | ICD-10-CM

## 2014-01-01 MED ORDER — METFORMIN HCL 500 MG PO TABS: 500.0000 mg | ORAL_TABLET | Freq: Two times a day (BID) | ORAL | Status: DC

## 2014-01-01 NOTE — Progress Notes (Signed)
I recommend that this patient start metformin 500 mg twice a day, #60, 5 refills, patient will need metabolic 7 in approximately 3-4 weeks to make sure her kidneys are getting along with this. Also recommended patient follow-up office visit in approximate 3 months to check A1c

## 2014-01-01 NOTE — Telephone Encounter (Signed)
Patient is going to work better on her diet with the starches. She is taking insulin 70/30 with 15-20 units BID. She is willing to go back to Metformin. Also had an eye dr appt last week with [facility]. Deborha Payment told her she does not have any diabetic damage.

## 2014-01-01 NOTE — Progress Notes (Signed)
Discussed with paitent. Metformin sent to [facility]. Order for met 7 put in for pt to do in 3 -4 weeks. Pt has a follow up scheduled in 3 months for a1C.  

## 2014-01-04 ENCOUNTER — Encounter: Payer: Self-pay | Admitting: Family Medicine

## 2014-01-18 ENCOUNTER — Other Ambulatory Visit: Payer: Self-pay | Admitting: Family Medicine

## 2014-01-27 ENCOUNTER — Encounter: Payer: Self-pay | Admitting: Obstetrics & Gynecology

## 2014-01-27 ENCOUNTER — Ambulatory Visit (INDEPENDENT_AMBULATORY_CARE_PROVIDER_SITE_OTHER): Payer: Medicare Other | Admitting: Obstetrics & Gynecology

## 2014-01-27 VITALS — BP 118/70 | Wt 190.0 lb

## 2014-01-27 DIAGNOSIS — N920 Excessive and frequent menstruation with regular cycle: Secondary | ICD-10-CM

## 2014-01-27 NOTE — Progress Notes (Signed)
Patient ID: Carrie Cook, female   DOB: [birth year], 54 y.o.   MRN: [id number] Patient ID: Carrie Cook, female   DOB: 12-15-1960, 54 y.o.   MRN: [id number]   Pt is amenorrheics /p 300 mg depo + 120 mg dailoy megestrol  Will not re give depo Decrease megestrol to 80 mg daily and then try 40 mg daily Change estradiol to 2 mg at bedtime  Follow up in 6 months  Chief Complaint  Patient presents with  . Follow-up    taking megace.    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  EXAM No exam  Assessment/Plan:  Menometrorrhagia with associated dysmenorrhea Fibroid Perimenopausal  We'll give Depo-Provera 300 mg IM continue on Megace 120 mg daily Followup in 3 months for sending appropriate response

## 2014-01-28 DIAGNOSIS — F339 Major depressive disorder, recurrent, unspecified: Secondary | ICD-10-CM | POA: Diagnosis not present

## 2014-01-31 ENCOUNTER — Other Ambulatory Visit: Payer: Self-pay | Admitting: Family Medicine

## 2014-02-02 ENCOUNTER — Encounter: Payer: Medicare Other | Attending: General Surgery | Admitting: Dietician

## 2014-02-02 DIAGNOSIS — Z6832 Body mass index (BMI) 32.0-32.9, adult: Secondary | ICD-10-CM | POA: Insufficient documentation

## 2014-02-02 DIAGNOSIS — Z48815 Encounter for surgical aftercare following surgery on the digestive system: Secondary | ICD-10-CM | POA: Insufficient documentation

## 2014-02-02 DIAGNOSIS — Z9884 Bariatric surgery status: Secondary | ICD-10-CM | POA: Insufficient documentation

## 2014-02-02 DIAGNOSIS — Z79899 Other long term (current) drug therapy: Secondary | ICD-10-CM | POA: Diagnosis not present

## 2014-02-02 DIAGNOSIS — E119 Type 2 diabetes mellitus without complications: Secondary | ICD-10-CM | POA: Diagnosis not present

## 2014-02-02 DIAGNOSIS — E669 Obesity, unspecified: Secondary | ICD-10-CM | POA: Insufficient documentation

## 2014-02-02 DIAGNOSIS — Z794 Long term (current) use of insulin: Secondary | ICD-10-CM | POA: Insufficient documentation

## 2014-02-02 NOTE — Patient Instructions (Addendum)
Goals:  Follow Phase 3B: High Protein + Non-Starchy Vegetables  Eat 3-6 small meals/snacks, every 3-5 hrs  Aim to get 60-80 g protein per day  Have 2-3 oz protein at meals and 1-2 oz of protein at snacks if you are hungry  Increase fluid intake to 64oz + (mostly water)  Avoid drinking 15 minutes before, during and 30 minutes after eating  Aim for >30 min of physical activity daily  Think about contacting Everardo Beals (social worker) to help emotional eating [phone]  Try to get sweets out of your house or have husband "hide sweets"  Consider testing blood sugar again and writing down numbers.

## 2014-02-02 NOTE — Progress Notes (Signed)
Follow-up visit:  15 Months Post-Operative RYGB Surgery  Medical Nutrition Therapy:  Appt start time: 930 end time:  1000.  Primary concerns today: Post-operative Bariatric Surgery Nutrition Management. Carrie Cook returns today with a 6 lb weight gain. Still stress eating and having sweets and getting depressed after she eats sweets. Has even more stress than before. Has not talked to a counselor yet but would to.   Blood sugar is dropping since she went on Metformin in the middle of the night and afternoon. Not checking blood sugar.   Has sweets at home since her grandchildren and husband eat it. Has cut out ice cream. Needs to get in more vegetables. Eating a lot of carbohydrates.   Surgery date: 10/21/12 Surgery type: RYGB Start weight at [facility]: 233.0 lbs (08/12/12) Weight today: 187.5 lbs  Weight change: 6 lb gain Total weight lost: 45.5 lbs  Weight loss goal: 145 lbs % weight goal achieved: 59%   TANITA  BODY COMP RESULTS  09/26/12 12/03/12 01/21/13 03/06/13 06/04/13 09/01/13 12/03/13 02/02/13   BMI (kg/m^2) 42.5 38.2 35.3 33.0 31.4 31.3 32.2    Fat Mass (lbs) 111.5 98.5 91.0 80.5 73.5 71.5 80.5    Fat Free Mass (lbs) 128.5 117.0 108.0 106.0 104.0 105.0 101.0    Total Body Water (lbs) 94.0 85.5 79.0 77.5 76.0 77.0 74.0     Preferred Learning Style:  No preference indicated   Learning Readiness:   Ready  24-hr recall: B (AM): 1 cup cottage cheese and fruit (26 g protein)  Snk (AM): yogurt (12 g) or frozen banana in chocolate or fruit L (PM):peanut butter crackers, or wrap Snk (PM): yogurt  (12) or cottage cheese D (PM): lean cuisine [place] with green beans or 4 oz protein (17-28g) Snk (PM):1 cup cottage cheese (26 g)  Fluid intake:  2 large glasses water, 4 cups coffee (decaf and regular), protein shake (EAS Advantage) (total at least 64 fl oz) (not consistent) Estimated total protein intake: having at least 60 g+ protein  Medications: insulin, Metformin Supplementation:  stopped taking calcium since she eating a lot of yogurt and cottage   Average CBG per patient: not testing Last patient reported A1c: "7- something" in October  Using straws:No, unless has sugar free iced coffee  Drinking while eating: tries not to Hair loss: Yes - has gotten worse (started hormones and colored hair) Carbonated beverages: No N/V/D/C: nausea and vomiting when food doesn't settle right (when she doesn't chew well or eats quickly) more than before Dumping syndrome: No  Recent physical activity:  Walking, yard work, housework, taking care of horses (overall active)  Progress Towards Goal(s):  In progress.    Nutritional Diagnosis:  [place]-3.3 Overweight/obesity related to past poor dietary habits and physical inactivity as evidenced by patient w/ recent RYGB surgery following dietary guidelines for continued weight loss.    Intervention:  Nutrition education/diet enforcement.  Goals:  Follow Phase 3B: High Protein + Non-Starchy Vegetables  Eat 3-6 small meals/snacks, every 3-5 hrs  Aim to get 60-80 g protein per day  Have 2-3 oz protein at meals and 1-2 oz of protein at snacks if you are hungry  Increase fluid intake to 64oz + (mostly water)  Avoid drinking 15 minutes before, during and 30 minutes after eating  Aim for >30 min of physical activity daily  Think about contacting Everardo Beals (social worker) to help emotional eating [phone]  Try to get sweets out of your house or have husband "hide sweets"  Consider testing blood sugar again and writing down numbers.   Teaching Method Utilized:  Visual Auditory  Barriers to learning/adherence to lifestyle change: stress, emotional eating  Demonstrated degree of understanding via:  Teach Back   Monitoring/Evaluation:  Dietary intake, exercise, and body weight. Follow up in 2 months for 17 month post-op visit.

## 2014-04-06 ENCOUNTER — Ambulatory Visit: Payer: Medicare Other | Admitting: Dietician

## 2014-04-08 ENCOUNTER — Ambulatory Visit: Payer: Medicare Other | Admitting: Family Medicine

## 2014-04-14 ENCOUNTER — Ambulatory Visit (INDEPENDENT_AMBULATORY_CARE_PROVIDER_SITE_OTHER): Payer: Medicare Other | Admitting: Family Medicine

## 2014-04-14 ENCOUNTER — Encounter: Payer: Self-pay | Admitting: Family Medicine

## 2014-04-14 VITALS — BP 124/86 | Ht 63.0 in | Wt 193.0 lb

## 2014-04-14 DIAGNOSIS — I10 Essential (primary) hypertension: Secondary | ICD-10-CM | POA: Diagnosis not present

## 2014-04-14 DIAGNOSIS — E119 Type 2 diabetes mellitus without complications: Secondary | ICD-10-CM

## 2014-04-14 DIAGNOSIS — J4530 Mild persistent asthma, uncomplicated: Secondary | ICD-10-CM

## 2014-04-14 DIAGNOSIS — M2550 Pain in unspecified joint: Secondary | ICD-10-CM

## 2014-04-14 DIAGNOSIS — E785 Hyperlipidemia, unspecified: Secondary | ICD-10-CM | POA: Diagnosis not present

## 2014-04-14 DIAGNOSIS — Z79899 Other long term (current) drug therapy: Secondary | ICD-10-CM | POA: Diagnosis not present

## 2014-04-14 DIAGNOSIS — D509 Iron deficiency anemia, unspecified: Secondary | ICD-10-CM

## 2014-04-14 LAB — POCT GLYCOSYLATED HEMOGLOBIN (HGB A1C): Hemoglobin A1C: 6

## 2014-04-14 MED ORDER — METFORMIN HCL 500 MG PO TABS: ORAL_TABLET | ORAL | Status: DC

## 2014-04-14 MED ORDER — METOPROLOL TARTRATE 25 MG PO TABS: ORAL_TABLET | ORAL | Status: DC

## 2014-04-14 MED ORDER — FLUTICASONE PROPIONATE HFA 220 MCG/ACT IN AERO: 2.0000 | INHALATION_SPRAY | Freq: Two times a day (BID) | RESPIRATORY_TRACT | Status: DC

## 2014-04-14 MED ORDER — LOSARTAN POTASSIUM 50 MG PO TABS: 50.0000 mg | ORAL_TABLET | Freq: Every day | ORAL | Status: DC

## 2014-04-14 NOTE — Patient Instructions (Addendum)
Hypertension Hypertension, commonly called high blood pressure, is when the force of blood pumping through your arteries is too strong. Your arteries are the blood vessels that carry blood from your heart throughout your body. A blood pressure reading consists of a higher number over a lower number, such as 110/72. The higher number (systolic) is the pressure inside your arteries when your heart pumps. The lower number (diastolic) is the pressure inside your arteries when your heart relaxes. Ideally you want your blood pressure below 120/80. Hypertension forces your heart to work harder to pump blood. Your arteries may become narrow or stiff. Having hypertension puts you at risk for heart disease, stroke, and other problems.  RISK FACTORS Some risk factors for high blood pressure are controllable. Others are not.  Risk factors you cannot control include:   Race. You may be at higher risk if you are African American.  Age. Risk increases with age.  Gender. Men are at higher risk than women before age 45 years. After age 65, women are at higher risk than men. Risk factors you can control include:  Not getting enough exercise or physical activity.  Being overweight.  Getting too much fat, sugar, calories, or salt in your diet.  Drinking too much alcohol. SIGNS AND SYMPTOMS Hypertension does not usually cause signs or symptoms. Extremely high blood pressure (hypertensive crisis) may cause headache, anxiety, shortness of breath, and nosebleed. DIAGNOSIS  To check if you have hypertension, your health care provider will measure your blood pressure while you are seated, with your arm held at the level of your heart. It should be measured at least twice using the same arm. Certain conditions can cause a difference in blood pressure between your right and left arms. A blood pressure reading that is higher than normal on one occasion does not mean that you need treatment. If one blood pressure reading  is high, ask your health care provider about having it checked again. TREATMENT  Treating high blood pressure includes making lifestyle changes and possibly taking medicine. Living a healthy lifestyle can help lower high blood pressure. You may need to change some of your habits. Lifestyle changes may include:  Following the DASH diet. This diet is high in fruits, vegetables, and whole grains. It is low in salt, red meat, and added sugars.  Getting at least 2 hours of brisk physical activity every week.  Losing weight if necessary.  Not smoking.  Limiting alcoholic beverages.  Learning ways to reduce stress. If lifestyle changes are not enough to get your blood pressure under control, your health care provider may prescribe medicine. You may need to take more than one. Work closely with your health care provider to understand the risks and benefits. HOME CARE INSTRUCTIONS  Have your blood pressure rechecked as directed by your health care provider.   Take medicines only as directed by your health care provider. Follow the directions carefully. Blood pressure medicines must be taken as prescribed. The medicine does not work as well when you skip doses. Skipping doses also puts you at risk for problems.   Do not smoke.   Monitor your blood pressure at home as directed by your health care provider. SEEK MEDICAL CARE IF:   You think you are having a reaction to medicines taken.  You have recurrent headaches or feel dizzy.  You have swelling in your ankles.  You have trouble with your vision. SEEK IMMEDIATE MEDICAL CARE IF:  You develop a severe headache or confusion.    You have unusual weakness, numbness, or feel faint.  You have severe chest or abdominal pain.  You vomit repeatedly.  You have trouble breathing. MAKE SURE YOU:   Understand these instructions.  Will watch your condition.  Will get help right away if you are not doing well or get worse. Document  Released: 01/09/2005 Document Revised: 05/26/2013 Document Reviewed: 11/01/2012 Exit[employer] Patient Information 2015 [employer]. This information is not intended to replace advice given to you by your health care provider. Make sure you discuss any questions you have with your health care provider. DASH Eating Plan DASH stands for "Dietary Approaches to Stop Hypertension." The DASH eating plan is a healthy eating plan that has been shown to reduce high blood pressure (hypertension). Additional health benefits may include reducing the risk of type 2 diabetes mellitus, heart disease, and stroke. The DASH eating plan may also help with weight loss. WHAT DO I NEED TO KNOW ABOUT THE DASH EATING PLAN? For the DASH eating plan, you will follow these general guidelines:  Choose foods with a percent daily value for sodium of less than 5% (as listed on the food label).  Use salt-free seasonings or herbs instead of table salt or sea salt.  Check with your health care provider or pharmacist before using salt substitutes.  Eat lower-sodium products, often labeled as "lower sodium" or "no salt added."  Eat fresh foods.  Eat more vegetables, fruits, and low-fat dairy products.  Choose whole grains. Look for the word "whole" as the first word in the ingredient list.  Choose fish and skinless chicken or turkey more often than red meat. Limit fish, poultry, and meat to 6 oz (170 g) each day.  Limit sweets, desserts, sugars, and sugary drinks.  Choose heart-healthy fats.  Limit cheese to 1 oz (28 g) per day.  Eat more home-cooked food and less restaurant, buffet, and fast food.  Limit fried foods.  Cook foods using methods other than frying.  Limit canned vegetables. If you do use them, rinse them well to decrease the sodium.  When eating at a restaurant, ask that your food be prepared with less salt, or no salt if possible. WHAT FOODS CAN I EAT? Seek help from a dietitian for individual  calorie needs. Grains Whole grain or whole wheat bread. Brown rice. Whole grain or whole wheat pasta. Quinoa, bulgur, and whole grain cereals. Low-sodium cereals. Corn or whole wheat flour tortillas. Whole grain cornbread. Whole grain crackers. Low-sodium crackers. Vegetables Fresh or frozen vegetables (raw, steamed, roasted, or grilled). Low-sodium or reduced-sodium tomato and vegetable juices. Low-sodium or reduced-sodium tomato sauce and paste. Low-sodium or reduced-sodium canned vegetables.  Fruits All fresh, canned (in natural juice), or frozen fruits. Meat and Other Protein Products Ground beef (85% or leaner), grass-fed beef, or beef trimmed of fat. Skinless chicken or turkey. Ground chicken or turkey. Pork trimmed of fat. All fish and seafood. Eggs. Dried beans, peas, or lentils. Unsalted nuts and seeds. Unsalted canned beans. Dairy Low-fat dairy products, such as skim or 1% milk, 2% or reduced-fat cheeses, low-fat ricotta or cottage cheese, or plain low-fat yogurt. Low-sodium or reduced-sodium cheeses. Fats and Oils Tub margarines without trans fats. Light or reduced-fat mayonnaise and salad dressings (reduced sodium). Avocado. Safflower, olive, or canola oils. Natural peanut or almond butter. Other Unsalted popcorn and pretzels. The items listed above may not be a complete list of recommended foods or beverages. Contact your dietitian for more options. WHAT FOODS ARE NOT RECOMMENDED? Grains White bread.   White pasta. White rice. Refined cornbread. Bagels and croissants. Crackers that contain trans fat. Vegetables Creamed or fried vegetables. Vegetables in a cheese sauce. Regular canned vegetables. Regular canned tomato sauce and paste. Regular tomato and vegetable juices. Fruits Dried fruits. Canned fruit in light or heavy syrup. Fruit juice. Meat and Other Protein Products Fatty cuts of meat. Ribs, chicken wings, bacon, sausage, bologna, salami, chitterlings, fatback, hot dogs,  bratwurst, and packaged luncheon meats. Salted nuts and seeds. Canned beans with salt. Dairy Whole or 2% milk, cream, half-and-half, and cream cheese. Whole-fat or sweetened yogurt. Full-fat cheeses or blue cheese. Nondairy creamers and whipped toppings. Processed cheese, cheese spreads, or cheese curds. Condiments Onion and garlic salt, seasoned salt, table salt, and sea salt. Canned and packaged gravies. Worcestershire sauce. Tartar sauce. Barbecue sauce. Teriyaki sauce. Soy sauce, including reduced sodium. Steak sauce. Fish sauce. Oyster sauce. Cocktail sauce. Horseradish. Ketchup and mustard. Meat flavorings and tenderizers. Bouillon cubes. Hot sauce. Tabasco sauce. Marinades. Taco seasonings. Relishes. Fats and Oils Butter, stick margarine, lard, shortening, ghee, and bacon fat. Coconut, palm kernel, or palm oils. Regular salad dressings. Other Pickles and olives. Salted popcorn and pretzels. The items listed above may not be a complete list of foods and beverages to avoid. Contact your dietitian for more information. WHERE CAN I FIND MORE INFORMATION? National Heart, Lung, and Blood Institute: travelstabloid.com Document Released: 12/29/2010 Document Revised: 05/26/2013 Document Reviewed: 11/13/2012 [facility] Patient Information 2015 [place], [place]. This information is not intended to replace advice given to you by your health care provider. Make sure you discuss any questions you have with your health care provider.    You need to scale back and take care of your health

## 2014-04-14 NOTE — Progress Notes (Signed)
   Subjective:    Patient ID: Carrie Cook, female    DOB: [birth year], 54 y.o.   MRN: [id number]  Diabetes She presents for her follow-up diabetic visit. She has type 2 diabetes mellitus. Pertinent negatives for hypoglycemia include no confusion. Pertinent negatives for diabetes include no chest pain, no fatigue, no polydipsia, no polyphagia and no weakness. Current diabetic treatment includes oral agent (monotherapy) and insulin injections. She is compliant with treatment all of the time. Exercise: feeding horses, watching grandchildren. Home blood sugar record trend: pt does not check blood sugar. She does not see a podiatrist.Eye exam is current.   Stopped qvar inhaler because of side effects. Would like to try a different inhaler.   The patient relates that she states though busy that she does not exercise much. She also relates that she only checks her sugar occasionally and states she's not had any low spells. Patient also relates that she is gotten off of her eating schedule and is gaining some weight. She denies any rectal bleeding denies chest pain. She does relate intermittent wheezing uses albuterol 3-4 times a day states it Qvar bothered her so she stopped taking it Review of Systems  Constitutional: Negative for activity change, appetite change and fatigue.  HENT: Negative for congestion.   Respiratory: Negative for cough.   Cardiovascular: Negative for chest pain.  Gastrointestinal: Negative for abdominal pain.  Endocrine: Negative for polydipsia and polyphagia.  Neurological: Negative for weakness.  Psychiatric/Behavioral: Negative for confusion.       Objective:   Physical Exam  Constitutional: She appears well-nourished. No distress.  Cardiovascular: Normal rate, regular rhythm and normal heart sounds.   No murmur heard. Pulmonary/Chest: Effort normal and breath sounds normal. No respiratory distress.  Musculoskeletal: She exhibits no edema.  Lymphadenopathy:    She  has no cervical adenopathy.  Neurological: She is alert. She exhibits normal muscle tone.  Psychiatric: Her behavior is normal.  Vitals reviewed.         Assessment & Plan:  1. Type 2 diabetes mellitus without complication Diabetes good control continue current measures get better with dietary measures improve exercise. - POCT glycosylated hemoglobin (Hb A1C)  2. Essential hypertension Blood pressure borderline minimize salt diet check metabolic 7 continue medication try to lose some weight. Exercise more often. - Basic metabolic panel  3. Hyperlipidemia Check lipid profile. Continue current measures. Patient could not tolerate statins in the past. - Lipid panel  4. Arthralgia Intermittent arthralgias worse with excessive activity. She is to portion her activity in such a way as not to overuse her joints. I doubt any type or rheumatoid issue.  5. Asthma, mild persistent, uncomplicated We will switch her over to Flovent this should help with the asthma. Albuterol as a as needed basis try to scale back on the use of albuterol to hopefully just an occasional when necessary  6. Anemia, iron deficiency Patient had a gastric bypass along with history of anemia we will check CBC and ferritin patient denies rectal bleeding - CBC with Differential/Platelet - Ferritin  7. High risk medication use Because of all of her medication use she does need check liver profile. - Hepatic function panel  Greater than 25 minutes spent with patient discussing multiple health issues in greater in half and questions which were covered. She ought to follow-up in 4-6 months. None in 214.

## 2014-04-22 DIAGNOSIS — F339 Major depressive disorder, recurrent, unspecified: Secondary | ICD-10-CM | POA: Diagnosis not present

## 2014-04-25 DIAGNOSIS — I10 Essential (primary) hypertension: Secondary | ICD-10-CM | POA: Diagnosis not present

## 2014-04-25 DIAGNOSIS — Z79899 Other long term (current) drug therapy: Secondary | ICD-10-CM | POA: Diagnosis not present

## 2014-04-25 DIAGNOSIS — E785 Hyperlipidemia, unspecified: Secondary | ICD-10-CM | POA: Diagnosis not present

## 2014-04-25 DIAGNOSIS — D509 Iron deficiency anemia, unspecified: Secondary | ICD-10-CM | POA: Diagnosis not present

## 2014-04-27 LAB — CBC WITH DIFFERENTIAL/PLATELET
Basophils Absolute: 0 10*3/uL (ref 0.0–0.2)
Basos: 0 %
Eos: 2 %
Eosinophils Absolute: 0.2 10*3/uL (ref 0.0–0.4)
HCT: 47.7 % — ABNORMAL HIGH (ref 34.0–46.6)
Hemoglobin: 16.6 g/dL — ABNORMAL HIGH (ref 11.1–15.9)
Immature Grans (Abs): 0 10*3/uL (ref 0.0–0.1)
Immature Granulocytes: 0 %
Lymphocytes Absolute: 3.1 10*3/uL (ref 0.7–3.1)
Lymphs: 31 %
MCH: 32.6 pg (ref 26.6–33.0)
MCHC: 34.8 g/dL (ref 31.5–35.7)
MCV: 94 fL (ref 79–97)
Monocytes Absolute: 0.9 10*3/uL (ref 0.1–0.9)
Monocytes: 9 %
Neutrophils Absolute: 5.7 10*3/uL (ref 1.4–7.0)
Neutrophils Relative %: 58 %
Platelets: 239 10*3/uL (ref 150–379)
RBC: 5.09 x10E6/uL (ref 3.77–5.28)
RDW: 12.8 % (ref 12.3–15.4)
WBC: 10 10*3/uL (ref 3.4–10.8)

## 2014-04-27 LAB — LIPID PANEL
Chol/HDL Ratio: 5.2 ratio units — ABNORMAL HIGH (ref 0.0–4.4)
Cholesterol, Total: 187 mg/dL (ref 100–199)
HDL: 36 mg/dL — ABNORMAL LOW (ref >39)
LDL Calculated: 110 mg/dL — ABNORMAL HIGH (ref 0–99)
Triglycerides: 203 mg/dL — ABNORMAL HIGH (ref 0–149)
VLDL Cholesterol Cal: 41 mg/dL — ABNORMAL HIGH (ref 5–40)

## 2014-04-27 LAB — BASIC METABOLIC PANEL
BUN/Creatinine Ratio: 22 (ref 9–23)
BUN: 14 mg/dL (ref 6–24)
CO2: 21 mmol/L (ref 18–29)
Calcium: 9.2 mg/dL (ref 8.7–10.2)
Chloride: 102 mmol/L (ref 97–108)
Creatinine, Ser: 0.64 mg/dL (ref 0.57–1.00)
GFR calc Af Amer: 119 mL/min/{1.73_m2} (ref >59)
GFR calc non Af Amer: 103 mL/min/{1.73_m2} (ref >59)
Glucose: 119 mg/dL — ABNORMAL HIGH (ref 65–99)
Potassium: 4.3 mmol/L (ref 3.5–5.2)
Sodium: 139 mmol/L (ref 134–144)

## 2014-04-27 LAB — HEPATIC FUNCTION PANEL
ALT: 17 IU/L (ref 0–32)
AST: 11 IU/L (ref 0–40)
Albumin: 4.3 g/dL (ref 3.5–5.5)
Alkaline Phosphatase: 93 IU/L (ref 39–117)
Bilirubin Total: 0.4 mg/dL (ref 0.0–1.2)
Bilirubin, Direct: 0.12 mg/dL (ref 0.00–0.40)
Total Protein: 6.2 g/dL (ref 6.0–8.5)

## 2014-04-27 LAB — FERRITIN: Ferritin: 126 ng/mL (ref 15–150)

## 2014-04-29 ENCOUNTER — Other Ambulatory Visit: Payer: Self-pay | Admitting: *Deleted

## 2014-04-29 NOTE — Progress Notes (Signed)
Patient notified and verbalized understanding of the test results. Would like to try the Crestor. No need to call patient. Is expecting the RX to be sent.

## 2014-04-30 ENCOUNTER — Other Ambulatory Visit: Payer: Self-pay

## 2014-04-30 MED ORDER — ROSUVASTATIN CALCIUM 5 MG PO TABS
5.0000 mg | ORAL_TABLET | Freq: Every day | ORAL | Status: DC
Start: 2014-04-30 — End: 2014-09-07

## 2014-05-07 ENCOUNTER — Encounter: Payer: Medicare Other | Attending: General Surgery | Admitting: Dietician

## 2014-05-07 DIAGNOSIS — E669 Obesity, unspecified: Secondary | ICD-10-CM | POA: Diagnosis not present

## 2014-05-07 DIAGNOSIS — Z79899 Other long term (current) drug therapy: Secondary | ICD-10-CM | POA: Diagnosis not present

## 2014-05-07 DIAGNOSIS — Z48815 Encounter for surgical aftercare following surgery on the digestive system: Secondary | ICD-10-CM | POA: Insufficient documentation

## 2014-05-07 DIAGNOSIS — E119 Type 2 diabetes mellitus without complications: Secondary | ICD-10-CM | POA: Diagnosis not present

## 2014-05-07 DIAGNOSIS — Z9884 Bariatric surgery status: Secondary | ICD-10-CM | POA: Diagnosis not present

## 2014-05-07 DIAGNOSIS — Z6832 Body mass index (BMI) 32.0-32.9, adult: Secondary | ICD-10-CM | POA: Insufficient documentation

## 2014-05-07 DIAGNOSIS — Z794 Long term (current) use of insulin: Secondary | ICD-10-CM | POA: Insufficient documentation

## 2014-05-07 NOTE — Patient Instructions (Addendum)
Goals:  Follow Phase 3B: High Protein + Non-Starchy Vegetables  Eat 3-6 small meals/snacks, every 3-5 hrs  Aim to get 60-80 g protein per day  Have 2-3 oz protein at meals and 1-2 oz of protein at snacks if you are hungry  Increase fluid intake to 64oz +   Avoid drinking 15 minutes before, during and 30 minutes after eating  Aim for >30 min of physical activity daily  Consider testing blood sugar again and writing down numbers.   Ideally, try to have several meals with solid protein foods and vegetables (high fiber).  Keep working on chewing well.

## 2014-05-07 NOTE — Progress Notes (Signed)
Follow-up visit:  18 Months Post-Operative RYGB Surgery  Medical Nutrition Therapy:  Appt start time: 1000 end time:  1045.  Primary concerns today: Post-operative Bariatric Surgery Nutrition Management. Yamilka returns today with a 6 lb weight gain. Having more stress than last time. Most of her time is spent caring for grandchildren. Seeing therapist every 6 weeks and suggested that she start doing group therapy which she doesn't feel like she has time for. Trying to have more protein shakes (started a week ago). Having more yogurt instead of ice cream.   Was having low sugar at night but adjusted insulin so that is no longer happening.  Has sweets at home since her grandchildren and husband eat it. Deciding to no longer buy ice cream. Husband will cookies and candy.   PCP thinks she may have a hernia. Some food such as chopped steak have given her dumping syndrome. Working on chewing better. Not sleeping well.   Surgery date: 10/21/12 Surgery type: RYGB Start weight at [facility]: 233.0 lbs (08/12/12) Weight today: 193.5 lbs  Weight change: 6 lb gain Total weight lost: 39.5 lbs  Weight loss goal: 145 lbs   TANITA  BODY COMP RESULTS  09/26/12 12/03/12 01/21/13 03/06/13 06/04/13 09/01/13 12/03/13 05/07/14   BMI (kg/m^2) 42.5 38.2 35.3 33.0 31.4 31.3 32.2 34.3   Fat Mass (lbs) 111.5 98.5 91.0 80.5 73.5 71.5 80.5 90.0   Fat Free Mass (lbs) 128.5 117.0 108.0 106.0 104.0 105.0 101.0 103.5   Total Body Water (lbs) 94.0 85.5 79.0 77.5 76.0 77.0 74.0 76.0    Preferred Learning Style:  No preference indicated   Learning Readiness:   Ready  24-hr recall: B (AM): 1 cup cottage cheese and fruit (26 g protein)  Snk (AM): yogurt (12 g) or frozen banana in chocolate or fruit L (PM):peanut butter crackers, or wrap Snk (PM): yogurt  (12) or cottage cheese D (PM): lean cuisine [place] with green beans or 4 oz protein (17-28g) Snk (PM):1 cup cottage cheese (26 g) sometimes   Fluid intake:  20 oz.  water, 4 cups coffee (decaf and regular), 4 protein shakes ([employer]) (not consistent) Estimated total protein intake: having at least 60 g+ protein  Medications: insulin, Metformin Supplementation: stopped taking calcium since she eating a lot of yogurt and cottage   Average CBG per patient: not testing Last patient reported A1c: "6- something" early April  Using straws:No, unless has sugar free iced coffee  Drinking while eating: tries not to Hair loss: Yes - has gotten worse (started hormones and colored hair) Carbonated beverages: No N/V/D/C: nausea and vomiting when food doesn't settle right (when she doesn't chew well or eats quickly) more than before Dumping syndrome: No  Recent physical activity:  Walking, yard work, housework, taking care of horses (overall active)  Progress Towards Goal(s):  In progress.    Nutritional Diagnosis:  [place]-3.3 Overweight/obesity related to past poor dietary habits and physical inactivity as evidenced by patient w/ recent RYGB surgery following dietary guidelines for continued weight loss.    Intervention:  Nutrition education/diet enforcement.  Goals:  Follow Phase 3B: High Protein + Non-Starchy Vegetables  Eat 3-6 small meals/snacks, every 3-5 hrs  Aim to get 60-80 g protein per day  Have 2-3 oz protein at meals and 1-2 oz of protein at snacks if you are hungry  Increase fluid intake to 64oz +   Avoid drinking 15 minutes before, during and 30 minutes after eating  Aim for >30 min of physical activity  daily  Consider testing blood sugar again and writing down numbers.   Ideally, try to have several meals with solid protein foods and vegetables (high fiber).  Keep working on chewing well.  Teaching Method Utilized:  Visual Auditory  Barriers to learning/adherence to lifestyle change: stress, emotional eating  Demonstrated degree of understanding via:  Teach Back   Monitoring/Evaluation:  Dietary intake, exercise, and  body weight. Follow up in 3 months for 21 month post-op visit.

## 2014-05-19 ENCOUNTER — Ambulatory Visit (INDEPENDENT_AMBULATORY_CARE_PROVIDER_SITE_OTHER): Payer: Medicare Other | Admitting: Family Medicine

## 2014-05-19 ENCOUNTER — Encounter: Payer: Self-pay | Admitting: Family Medicine

## 2014-05-19 VITALS — BP 130/90 | Temp 98.3°F | Ht 63.0 in | Wt 194.0 lb

## 2014-05-19 DIAGNOSIS — J019 Acute sinusitis, unspecified: Secondary | ICD-10-CM | POA: Diagnosis not present

## 2014-05-19 DIAGNOSIS — B349 Viral infection, unspecified: Secondary | ICD-10-CM

## 2014-05-19 DIAGNOSIS — B9689 Other specified bacterial agents as the cause of diseases classified elsewhere: Secondary | ICD-10-CM

## 2014-05-19 MED ORDER — CEFPROZIL 500 MG PO TABS: 500.0000 mg | ORAL_TABLET | Freq: Two times a day (BID) | ORAL | Status: DC

## 2014-05-19 NOTE — Progress Notes (Signed)
   Subjective:    Patient ID: Carrie Cook, female    DOB: May 27, 1960, 54 y.o.   MRN: [id number]  Sinusitis This is a new problem. The current episode started in the past 7 days. The problem is unchanged. There has been no fever. The pain is moderate. Associated symptoms include congestion, coughing, shortness of breath and swollen glands. Pertinent negatives include no ear pain. Treatments tried: claritin  The treatment provided no relief.  No other concerns at this time.  Chest sx also Sinus pain Low grade fever Some sweats and chills Some cough Using albuterol usinf flovent and claritin  Review of Systems  Constitutional: Negative for fever and activity change.  HENT: Positive for congestion and rhinorrhea. Negative for ear pain.   Eyes: Negative for discharge.  Respiratory: Positive for cough and shortness of breath. Negative for wheezing.   Cardiovascular: Negative for chest pain.       Objective:   Physical Exam  Constitutional: She appears well-developed.  HENT:  Head: Normocephalic.  Nose: Nose normal.  Mouth/Throat: Oropharynx is clear and moist. No oropharyngeal exudate.  Neck: Neck supple.  Cardiovascular: Normal rate and normal heart sounds.   No murmur heard. Pulmonary/Chest: Effort normal and breath sounds normal. She has no wheezes.  Lymphadenopathy:    She has no cervical adenopathy.  Skin: Skin is warm and dry.  Nursing note and vitals reviewed.     Lipid/liver/A1C in 12 weeks    Assessment & Plan:  Allergy issues Sinusitis Antibiotics prescribed Follow-up if ongoing trouble Warning signs discussed  To check lipid liver A1c in 12 weeks follow-up office visit in August

## 2014-05-19 NOTE — Patient Instructions (Signed)

## 2014-06-04 DIAGNOSIS — Z6841 Body Mass Index (BMI) 40.0 and over, adult: Secondary | ICD-10-CM | POA: Diagnosis not present

## 2014-06-04 DIAGNOSIS — Z9884 Bariatric surgery status: Secondary | ICD-10-CM | POA: Diagnosis not present

## 2014-07-03 ENCOUNTER — Other Ambulatory Visit: Payer: Self-pay | Admitting: Family Medicine

## 2014-07-13 ENCOUNTER — Telehealth: Payer: Self-pay | Admitting: Obstetrics & Gynecology

## 2014-07-13 NOTE — Telephone Encounter (Signed)
Spoke with pt. Pt has been on Megace and Estradiol. She took a fall last week and she had a brain injury. She has been adjusting Megace according to her bleeding. She has been taking 3 Megace since Thursday and bleeding has been heavy x 3 days. Pt states Dr. Elonda Husky has mentioned an ablation. Appt scheduled tomorrow with Dr. Elonda Husky to discuss. Pt voiced understanding. [place]

## 2014-07-14 ENCOUNTER — Encounter: Payer: Self-pay | Admitting: Obstetrics & Gynecology

## 2014-07-14 ENCOUNTER — Ambulatory Visit (INDEPENDENT_AMBULATORY_CARE_PROVIDER_SITE_OTHER): Payer: Medicare Other | Admitting: Obstetrics & Gynecology

## 2014-07-14 VITALS — BP 120/80 | HR 92 | Wt 191.4 lb

## 2014-07-14 DIAGNOSIS — N920 Excessive and frequent menstruation with regular cycle: Secondary | ICD-10-CM | POA: Diagnosis not present

## 2014-07-16 ENCOUNTER — Other Ambulatory Visit: Payer: Self-pay | Admitting: Obstetrics & Gynecology

## 2014-07-16 ENCOUNTER — Encounter (HOSPITAL_COMMUNITY)
Admission: RE | Admit: 2014-07-16 | Discharge: 2014-07-16 | Disposition: A | Discharge status: Home / Self Care | Payer: Medicare Other | Source: Ambulatory Visit | Attending: Obstetrics & Gynecology | Admitting: Obstetrics & Gynecology

## 2014-07-16 ENCOUNTER — Encounter (HOSPITAL_COMMUNITY): Payer: Self-pay

## 2014-07-16 DIAGNOSIS — N920 Excessive and frequent menstruation with regular cycle: Secondary | ICD-10-CM | POA: Diagnosis not present

## 2014-07-16 DIAGNOSIS — Z01810 Encounter for preprocedural cardiovascular examination: Secondary | ICD-10-CM | POA: Diagnosis not present

## 2014-07-16 DIAGNOSIS — Z01812 Encounter for preprocedural laboratory examination: Secondary | ICD-10-CM | POA: Diagnosis not present

## 2014-07-16 LAB — HCG, QUANTITATIVE, PREGNANCY: hCG, Beta Chain, Quant, S: <1 m[IU]/mL (ref <5)

## 2014-07-16 LAB — COMPREHENSIVE METABOLIC PANEL
ALT: 21 U/L (ref 14–54)
AST: 17 U/L (ref 15–41)
Albumin: 4 g/dL (ref 3.5–5.0)
Alkaline Phosphatase: 99 U/L (ref 38–126)
Anion gap: 7 (ref 5–15)
BUN: 13 mg/dL (ref 6–20)
CO2: 21 mmol/L — ABNORMAL LOW (ref 22–32)
Calcium: 9.1 mg/dL (ref 8.9–10.3)
Chloride: 108 mmol/L (ref 101–111)
Creatinine, Ser: 0.58 mg/dL (ref 0.44–1.00)
GFR calc Af Amer: >60 mL/min (ref >60)
GFR calc non Af Amer: >60 mL/min (ref >60)
Glucose, Bld: 160 mg/dL — ABNORMAL HIGH (ref 65–99)
Potassium: 4 mmol/L (ref 3.5–5.1)
Sodium: 136 mmol/L (ref 135–145)
Total Bilirubin: 0.4 mg/dL (ref 0.3–1.2)
Total Protein: 6.5 g/dL (ref 6.5–8.1)

## 2014-07-16 LAB — URINALYSIS, ROUTINE W REFLEX MICROSCOPIC
Bilirubin Urine: NEGATIVE
Glucose, UA: NEGATIVE mg/dL
Ketones, ur: NEGATIVE mg/dL
Leukocytes, UA: NEGATIVE
Nitrite: NEGATIVE
Protein, ur: NEGATIVE mg/dL
Specific Gravity, Urine: 1.015 (ref 1.005–1.030)
Urobilinogen, UA: 0.2 mg/dL (ref 0.0–1.0)
pH: 5.5 (ref 5.0–8.0)

## 2014-07-16 LAB — CBC
HCT: 44.9 % (ref 36.0–46.0)
Hemoglobin: 16.1 g/dL — ABNORMAL HIGH (ref 12.0–15.0)
MCH: 32.9 pg (ref 26.0–34.0)
MCHC: 35.9 g/dL (ref 30.0–36.0)
MCV: 91.8 fL (ref 78.0–100.0)
Platelets: 229 10*3/uL (ref 150–400)
RBC: 4.89 MIL/uL (ref 3.87–5.11)
RDW: 11.8 % (ref 11.5–15.5)
WBC: 10.3 10*3/uL (ref 4.0–10.5)

## 2014-07-16 LAB — URINE MICROSCOPIC-ADD ON

## 2014-07-16 NOTE — Patient Instructions (Signed)
Carrie Cook  07/16/2014     @PREFPERIOPPHARMACY @   Your procedure is scheduled on 07/22/2014  Report to [facility] at  57  A.M.  Call this number if you have problems the morning of surgery:  [phone]   Remember:  Do not eat food or drink liquids after midnight.  Take these medicines the morning of surgery with A SIP OF WATER  Claritin, cozaar, metoprolol, effexor. Take your inhaler before you come.Take 1/2 insulin the night before your surgery. NO DIABETIC medicine the morning of your surgery.   Do not wear jewelry, make-up or nail polish.  Do not wear lotions, powders, or perfumes.    Do not shave 48 hours prior to surgery.  Men may shave face and neck.  Do not bring valuables to the hospital.  [facility] is not responsible for any belongings or valuables.  Contacts, dentures or bridgework may not be worn into surgery.  Leave your suitcase in the car.  After surgery it may be brought to your room.  For patients admitted to the hospital, discharge time will be determined by your treatment team.  Patients discharged the day of surgery will not be allowed to drive home.   Name and phone number of your driver:   family Special instructions:  none  Please read over the following fact sheets that you were given. Pain Booklet, Coughing and Deep Breathing, Surgical Site Infection Prevention, Anesthesia Post-op Instructions and Care and Recovery After Surgery      Dilation and Curettage or Vacuum Curettage Dilation and curettage (D&C) and vacuum curettage are minor procedures. A D&C involves stretching (dilation) the cervix and scraping (curettage) the inside lining of the womb (uterus). During a D&C, tissue is gently scraped from the inside lining of the uterus. During a vacuum curettage, the lining and tissue in the uterus are removed with the use of gentle suction.  Curettage may be performed to either diagnose or treat a problem. As a diagnostic  procedure, curettage is performed to examine tissues from the uterus. A diagnostic curettage may be performed for the following symptoms:   Irregular bleeding in the uterus.   Bleeding with the development of clots.   Spotting between menstrual periods.   Prolonged menstrual periods.   Bleeding after menopause.   No menstrual period (amenorrhea).   A change in size and shape of the uterus.  As a treatment procedure, curettage may be performed for the following reasons:   Removal of an IUD (intrauterine device).   Removal of retained placenta after giving birth. Retained placenta can cause an infection or bleeding severe enough to require transfusions.   Abortion.   Miscarriage.   Removal of polyps inside the uterus.   Removal of uncommon types of noncancerous lumps (fibroids).  LET [facility] CARE PROVIDER KNOW ABOUT:   Any allergies you have.   All medicines you are taking, including vitamins, herbs, eye drops, creams, and over-the-counter medicines.   Previous problems you or members of your family have had with the use of anesthetics.   Any blood disorders you have.   Previous surgeries you have had.   Medical conditions you have. RISKS AND COMPLICATIONS  Generally, this is a safe procedure. However, as with any procedure, complications can occur. Possible complications include:  Excessive bleeding.   Infection of the uterus.   Damage to the cervix.   Development of scar tissue (adhesions) inside the  uterus, later causing abnormal amounts of menstrual bleeding.   Complications from the general anesthetic, if a general anesthetic is used.   Putting a hole (perforation) in the uterus. This is rare.  BEFORE THE PROCEDURE   Eat and drink before the procedure only as directed by your health care provider.   Arrange for someone to take you home.  PROCEDURE  This procedure usually takes about 15-30 minutes.  You will be given one  of the following:  A medicine that numbs the area in and around the cervix (local anesthetic).   A medicine to make you sleep through the procedure (general anesthetic).  You will lie on your back with your legs in stirrups.   A warm metal or plastic instrument (speculum) will be placed in your vagina to keep it open and to allow the health care provider to see the cervix.  There are two ways in which your cervix can be softened and dilated. These include:   Taking a medicine.   Having thin rods (laminaria) inserted into your cervix.   A curved tool (curette) will be used to scrape cells from the inside lining of the uterus. In some cases, gentle suction is applied with the curette. The curette will then be removed.  AFTER THE PROCEDURE   You will rest in the recovery area until you are stable and are ready to go home.   You may feel sick to your stomach (nauseous) or throw up (vomit) if you were given a general anesthetic.   You may have a sore throat if a tube was placed in your throat during general anesthesia.   You may have light cramping and bleeding. This may last for 2 days to 2 weeks after the procedure.   Your uterus needs to make a new lining after the procedure. This may make your next period late. Document Released: 01/09/2005 Document Revised: 09/11/2012 Document Reviewed: 08/08/2012 [facility] Patient Information 2015 [place], [place]. This information is not intended to replace advice given to you by your health care provider. Make sure you discuss any questions you have with your health care provider. Hysteroscopy Hysteroscopy is a procedure used for looking inside the womb (uterus). It may be done for various reasons, including:  To evaluate abnormal bleeding, fibroid (benign, noncancerous) tumors, polyps, scar tissue (adhesions), and possibly cancer of the uterus.  To look for lumps (tumors) and other uterine growths.  To look for causes of why a woman  cannot get pregnant (infertility), causes of recurrent loss of pregnancy (miscarriages), or a lost intrauterine device (IUD).  To perform a sterilization by blocking the fallopian tubes from inside the uterus. In this procedure, a thin, flexible tube with a tiny light and camera on the end of it (hysteroscope) is used to look inside the uterus. A hysteroscopy should be done right after a menstrual period to be sure you are not pregnant. LET [facility] CARE PROVIDER KNOW ABOUT:   Any allergies you have.  All medicines you are taking, including vitamins, herbs, eye drops, creams, and over-the-counter medicines.  Previous problems you or members of your family have had with the use of anesthetics.  Any blood disorders you have.  Previous surgeries you have had.  Medical conditions you have. RISKS AND COMPLICATIONS  Generally, this is a safe procedure. However, as with any procedure, complications can occur. Possible complications include:  Putting a hole in the uterus.  Excessive bleeding.  Infection.  Damage to the cervix.  Injury to  other organs.  Allergic reaction to medicines.  Too much fluid used in the uterus for the procedure. BEFORE THE PROCEDURE   Ask your health care provider about changing or stopping any regular medicines.  Do not take aspirin or blood thinners for 1 week before the procedure, or as directed by your health care provider. These can cause bleeding.  If you smoke, do not smoke for 2 weeks before the procedure.  In some cases, a medicine is placed in the cervix the day before the procedure. This medicine makes the cervix have a larger opening (dilate). This makes it easier for the instrument to be inserted into the uterus during the procedure.  Do not eat or drink anything for at least 8 hours before the surgery.  Arrange for someone to take you home after the procedure. PROCEDURE   You may be given a medicine to relax you (sedative). You may  also be given one of the following:  A medicine that numbs the area around the cervix (local anesthetic).  A medicine that makes you sleep through the procedure (general anesthetic).  The hysteroscope is inserted through the vagina into the uterus. The camera on the hysteroscope sends a picture to a TV screen. This gives the surgeon a good view inside the uterus.  During the procedure, air or a liquid is put into the uterus, which allows the surgeon to see better.  Sometimes, tissue is gently scraped from inside the uterus. These tissue samples are sent to a lab for testing. AFTER THE PROCEDURE   If you had a general anesthetic, you may be groggy for a couple hours after the procedure.  If you had a local anesthetic, you will be able to go home as soon as you are stable and feel ready.  You may have some cramping. This normally lasts for a couple days.  You may have bleeding, which varies from light spotting for a few days to menstrual-like bleeding for 3-7 days. This is normal.  If your test results are not back during the visit, make an appointment with your health care provider to find out the results. Document Released: 04/17/2000 Document Revised: 10/30/2012 Document Reviewed: 08/08/2012 [facility] Patient Information 2015 [place], [place]. This information is not intended to replace advice given to you by your health care provider. Make sure you discuss any questions you have with your health care provider. Endometrial Ablation Endometrial ablation removes the lining of the uterus (endometrium). It is usually a same-day, outpatient treatment. Ablation helps avoid major surgery, such as surgery to remove the cervix and uterus (hysterectomy). After endometrial ablation, you will have little or no menstrual bleeding and may not be able to have children. However, if you are premenopausal, you will need to use a reliable method of birth control following the procedure because of the small  chance that pregnancy can occur. There are different reasons to have this procedure, which include:  Heavy periods.  Bleeding that is causing anemia.  Irregular bleeding.  Bleeding fibroids on the lining inside the uterus if they are smaller than 3 centimeters. This procedure should not be done if:  You want children in the future.  You have severe cramps with your menstrual period.  You have precancerous or cancerous cells in your uterus.  You were recently pregnant.  You have gone through menopause.  You have had major surgery on the uterus, such as a cesarean delivery. LET [facility] CARE PROVIDER KNOW ABOUT:  Any allergies you have.  All medicines you are taking, including vitamins, herbs, eye drops, creams, and over-the-counter medicines.  Previous problems you or members of your family have had with the use of anesthetics.  Any blood disorders you have.  Previous surgeries you have had.  Medical conditions you have. RISKS AND COMPLICATIONS  Generally, this is a safe procedure. However, as with any procedure, complications can occur. Possible complications include:  Perforation of the uterus.  Bleeding.  Infection of the uterus, bladder, or vagina.  Injury to surrounding organs.  An air bubble to the lung (air embolus).  Pregnancy following the procedure.  Failure of the procedure to help the problem, requiring hysterectomy.  Decreased ability to diagnose cancer in the lining of the uterus. BEFORE THE PROCEDURE  The lining of the uterus must be tested to make sure there is no pre-cancerous or cancer cells present.  An ultrasound may be performed to look at the size of the uterus and to check for abnormalities.  Medicines may be given to thin the lining of the uterus. PROCEDURE  During the procedure, your health care provider will use a tool called a resectoscope to help see inside your uterus. There are different ways to remove the lining of your  uterus.   Radiofrequency - This method uses a radiofrequency-alternating electric current to remove the lining of the uterus.  Cryotherapy - This method uses extreme cold to freeze the lining of the uterus.  Heated-Free Liquid - This method uses heated salt (saline) solution to remove the lining of the uterus.  Microwave - This method uses high-energy microwaves to heat up the lining of the uterus to remove it.  Thermal balloon - This method involves inserting a catheter with a balloon tip into the uterus. The balloon tip is filled with heated fluid to remove the lining of the uterus. AFTER THE PROCEDURE  After your procedure, do not have sexual intercourse or insert anything into your vagina until permitted by your health care provider. After the procedure, you may experience:  Cramps.  Vaginal discharge.  Frequent urination. Document Released: 11/19/2003 Document Revised: 09/11/2012 Document Reviewed: 06/12/2012 [facility] Patient Information 2015 [place], [place]. This information is not intended to replace advice given to you by your health care provider. Make sure you discuss any questions you have with your health care provider. PATIENT INSTRUCTIONS POST-ANESTHESIA  IMMEDIATELY FOLLOWING SURGERY:  Do not drive or operate machinery for the first twenty four hours after surgery.  Do not make any important decisions for twenty four hours after surgery or while taking narcotic pain medications or sedatives.  If you develop intractable nausea and vomiting or a severe headache please notify your doctor immediately.  FOLLOW-UP:  Please make an appointment with your surgeon as instructed. You do not need to follow up with anesthesia unless specifically instructed to do so.  WOUND CARE INSTRUCTIONS (if applicable):  Keep a dry clean dressing on the anesthesia/puncture wound site if there is drainage.  Once the wound has quit draining you may leave it open to air.  Generally you should leave the  bandage intact for twenty four hours unless there is drainage.  If the epidural site drains for more than 36-48 hours please call the anesthesia department.  QUESTIONS?:  Please feel free to call your physician or the hospital operator if you have any questions, and they will be happy to assist you.

## 2014-07-22 ENCOUNTER — Ambulatory Visit (HOSPITAL_COMMUNITY)
Admission: RE | Admit: 2014-07-22 | Discharge: 2014-07-22 | Disposition: A | Discharge status: Home / Self Care | Payer: Medicare Other | Source: Ambulatory Visit | Attending: Obstetrics & Gynecology | Admitting: Obstetrics & Gynecology

## 2014-07-22 ENCOUNTER — Ambulatory Visit (HOSPITAL_COMMUNITY): Payer: Medicare Other | Admitting: Anesthesiology

## 2014-07-22 ENCOUNTER — Encounter (HOSPITAL_COMMUNITY)
Admission: RE | Disposition: A | Discharge status: Home / Self Care | Payer: Self-pay | Source: Ambulatory Visit | Attending: Obstetrics & Gynecology

## 2014-07-22 ENCOUNTER — Encounter (HOSPITAL_COMMUNITY): Payer: Self-pay | Admitting: *Deleted

## 2014-07-22 DIAGNOSIS — M479 Spondylosis, unspecified: Secondary | ICD-10-CM | POA: Insufficient documentation

## 2014-07-22 DIAGNOSIS — G5792 Unspecified mononeuropathy of left lower limb: Secondary | ICD-10-CM | POA: Diagnosis not present

## 2014-07-22 DIAGNOSIS — Z79899 Other long term (current) drug therapy: Secondary | ICD-10-CM | POA: Diagnosis not present

## 2014-07-22 DIAGNOSIS — J45909 Unspecified asthma, uncomplicated: Secondary | ICD-10-CM | POA: Diagnosis not present

## 2014-07-22 DIAGNOSIS — E1165 Type 2 diabetes mellitus with hyperglycemia: Secondary | ICD-10-CM | POA: Diagnosis not present

## 2014-07-22 DIAGNOSIS — N920 Excessive and frequent menstruation with regular cycle: Secondary | ICD-10-CM | POA: Diagnosis not present

## 2014-07-22 DIAGNOSIS — N921 Excessive and frequent menstruation with irregular cycle: Secondary | ICD-10-CM | POA: Insufficient documentation

## 2014-07-22 DIAGNOSIS — G4733 Obstructive sleep apnea (adult) (pediatric): Secondary | ICD-10-CM | POA: Diagnosis not present

## 2014-07-22 DIAGNOSIS — G5791 Unspecified mononeuropathy of right lower limb: Secondary | ICD-10-CM | POA: Diagnosis not present

## 2014-07-22 DIAGNOSIS — N946 Dysmenorrhea, unspecified: Secondary | ICD-10-CM | POA: Insufficient documentation

## 2014-07-22 DIAGNOSIS — E118 Type 2 diabetes mellitus with unspecified complications: Secondary | ICD-10-CM | POA: Diagnosis not present

## 2014-07-22 DIAGNOSIS — Z9989 Dependence on other enabling machines and devices: Secondary | ICD-10-CM | POA: Diagnosis not present

## 2014-07-22 DIAGNOSIS — Z6834 Body mass index (BMI) 34.0-34.9, adult: Secondary | ICD-10-CM | POA: Diagnosis not present

## 2014-07-22 DIAGNOSIS — I10 Essential (primary) hypertension: Secondary | ICD-10-CM | POA: Diagnosis not present

## 2014-07-22 DIAGNOSIS — M179 Osteoarthritis of knee, unspecified: Secondary | ICD-10-CM | POA: Insufficient documentation

## 2014-07-22 DIAGNOSIS — K219 Gastro-esophageal reflux disease without esophagitis: Secondary | ICD-10-CM | POA: Insufficient documentation

## 2014-07-22 DIAGNOSIS — G43909 Migraine, unspecified, not intractable, without status migrainosus: Secondary | ICD-10-CM | POA: Diagnosis not present

## 2014-07-22 HISTORY — PX: DILITATION & CURRETTAGE/HYSTROSCOPY WITH NOVASURE ABLATION: SHX5568

## 2014-07-22 LAB — GLUCOSE, CAPILLARY
Glucose-Capillary: 151 mg/dL — ABNORMAL HIGH (ref 65–99)
Glucose-Capillary: 137 mg/dL — ABNORMAL HIGH (ref 65–99)

## 2014-07-22 SURGERY — DILATATION & CURETTAGE/HYSTEROSCOPY WITH NOVASURE ABLATION
Anesthesia: General | Site: Vagina

## 2014-07-22 MED ORDER — FENTANYL CITRATE (PF) 100 MCG/2ML IJ SOLN
INTRAMUSCULAR | Status: DC | PRN
Start: 2014-07-22 — End: 2014-07-22
  Administered 2014-07-22 (×2): 50 ug via INTRAVENOUS

## 2014-07-22 MED ORDER — LIDOCAINE HCL (PF) 1 % IJ SOLN
INTRAMUSCULAR | Status: AC
  Filled 2014-07-22: qty 5

## 2014-07-22 MED ORDER — ONDANSETRON HCL 4 MG/2ML IJ SOLN: 4.0000 mg | Freq: Once | INTRAMUSCULAR | Status: DC | PRN

## 2014-07-22 MED ORDER — SUCCINYLCHOLINE CHLORIDE 20 MG/ML IJ SOLN
INTRAMUSCULAR | Status: AC
  Filled 2014-07-22: qty 1

## 2014-07-22 MED ORDER — PROPOFOL 10 MG/ML IV BOLUS
INTRAVENOUS | Status: DC | PRN
  Administered 2014-07-22: 150 mg via INTRAVENOUS

## 2014-07-22 MED ORDER — LACTATED RINGERS IV SOLN
INTRAVENOUS | Status: DC | PRN
  Administered 2014-07-22: 07:00:00 via INTRAVENOUS

## 2014-07-22 MED ORDER — ONDANSETRON HCL 4 MG/2ML IJ SOLN
INTRAMUSCULAR | Status: AC
  Filled 2014-07-22: qty 2

## 2014-07-22 MED ORDER — DEXAMETHASONE SODIUM PHOSPHATE 4 MG/ML IJ SOLN
INTRAMUSCULAR | Status: AC
  Filled 2014-07-22: qty 1

## 2014-07-22 MED ORDER — LACTATED RINGERS IV SOLN
INTRAVENOUS | Status: DC
  Administered 2014-07-22: 07:00:00 via INTRAVENOUS

## 2014-07-22 MED ORDER — OXYCODONE-ACETAMINOPHEN 5-325 MG PO TABS: 1.0000 | ORAL_TABLET | ORAL | Status: DC | PRN

## 2014-07-22 MED ORDER — KETOROLAC TROMETHAMINE 30 MG/ML IJ SOLN
30.0000 mg | Freq: Once | INTRAMUSCULAR | Status: AC
  Administered 2014-07-22: 30 mg via INTRAVENOUS

## 2014-07-22 MED ORDER — KETOROLAC TROMETHAMINE 10 MG PO TABS: 10.0000 mg | ORAL_TABLET | Freq: Three times a day (TID) | ORAL | Status: DC | PRN

## 2014-07-22 MED ORDER — LIDOCAINE HCL (CARDIAC) 20 MG/ML IV SOLN
INTRAVENOUS | Status: DC | PRN
  Administered 2014-07-22: 50 mg via INTRAVENOUS

## 2014-07-22 MED ORDER — ONDANSETRON HCL 4 MG/2ML IJ SOLN
4.0000 mg | Freq: Once | INTRAMUSCULAR | Status: AC
  Administered 2014-07-22: 4 mg via INTRAVENOUS

## 2014-07-22 MED ORDER — CEFAZOLIN SODIUM-DEXTROSE 2-3 GM-% IV SOLR
INTRAVENOUS | Status: AC
  Filled 2014-07-22: qty 50

## 2014-07-22 MED ORDER — MIDAZOLAM HCL 2 MG/2ML IJ SOLN
INTRAMUSCULAR | Status: AC
  Filled 2014-07-22: qty 2

## 2014-07-22 MED ORDER — EPHEDRINE SULFATE 50 MG/ML IJ SOLN
INTRAMUSCULAR | Status: DC | PRN
  Administered 2014-07-22: 5 mg via INTRAVENOUS

## 2014-07-22 MED ORDER — PROPOFOL 10 MG/ML IV BOLUS
INTRAVENOUS | Status: AC
  Filled 2014-07-22: qty 20

## 2014-07-22 MED ORDER — FENTANYL CITRATE (PF) 100 MCG/2ML IJ SOLN
25.0000 ug | INTRAMUSCULAR | Status: DC | PRN
Start: 2014-07-22 — End: 2014-07-22
  Administered 2014-07-22: 50 ug via INTRAVENOUS

## 2014-07-22 MED ORDER — DEXAMETHASONE SODIUM PHOSPHATE 4 MG/ML IJ SOLN
4.0000 mg | Freq: Once | INTRAMUSCULAR | Status: AC
  Administered 2014-07-22: 4 mg via INTRAVENOUS

## 2014-07-22 MED ORDER — FENTANYL CITRATE (PF) 100 MCG/2ML IJ SOLN
INTRAMUSCULAR | Status: AC
  Filled 2014-07-22: qty 2

## 2014-07-22 MED ORDER — SODIUM CHLORIDE 0.9 % IR SOLN
Status: DC | PRN
  Administered 2014-07-22: 1000 mL

## 2014-07-22 MED ORDER — SUCCINYLCHOLINE CHLORIDE 20 MG/ML IJ SOLN
INTRAMUSCULAR | Status: DC | PRN
  Administered 2014-07-22: 120 mg via INTRAVENOUS

## 2014-07-22 MED ORDER — CEFAZOLIN SODIUM-DEXTROSE 2-3 GM-% IV SOLR
2.0000 g | INTRAVENOUS | Status: AC
  Administered 2014-07-22: 2 g via INTRAVENOUS

## 2014-07-22 MED ORDER — MIDAZOLAM HCL 2 MG/2ML IJ SOLN
1.0000 mg | INTRAMUSCULAR | Status: DC | PRN
  Administered 2014-07-22: 2 mg via INTRAVENOUS

## 2014-07-22 MED ORDER — PROGESTERONE MICRONIZED 200 MG PO CAPS: ORAL_CAPSULE | ORAL | Status: DC

## 2014-07-22 MED ORDER — KETOROLAC TROMETHAMINE 30 MG/ML IJ SOLN
INTRAMUSCULAR | Status: AC
  Filled 2014-07-22: qty 1

## 2014-07-22 SURGICAL SUPPLY — 25 items
ABLATOR ENDOMETRIAL BIPOLAR (ABLATOR) ×2 IMPLANT
BAG HAMPER (MISCELLANEOUS) ×2 IMPLANT
CLOTH BEACON ORANGE TIMEOUT ST (SAFETY) ×2 IMPLANT
COVER LIGHT HANDLE STERIS (MISCELLANEOUS) ×4 IMPLANT
FORMALIN 10 PREFIL 120ML (MISCELLANEOUS) ×2 IMPLANT
GLOVE BIOGEL PI IND STRL 7.0 (GLOVE) ×1 IMPLANT
GLOVE BIOGEL PI IND STRL 8 (GLOVE) ×1 IMPLANT
GLOVE BIOGEL PI INDICATOR 7.0 (GLOVE) ×2
GLOVE BIOGEL PI INDICATOR 8 (GLOVE) ×1
GLOVE ECLIPSE 6.5 STRL STRAW (GLOVE) ×1 IMPLANT
GLOVE ECLIPSE 8.0 STRL XLNG CF (GLOVE) ×2 IMPLANT
GLOVE EXAM NITRILE MD LF STRL (GLOVE) ×1 IMPLANT
GOWN STRL REUS W/TWL LRG LVL3 (GOWN DISPOSABLE) ×2 IMPLANT
GOWN STRL REUS W/TWL XL LVL3 (GOWN DISPOSABLE) ×2 IMPLANT
INST SET HYSTEROSCOPY (KITS) ×2 IMPLANT
IV NS 1000ML (IV SOLUTION) ×2
IV NS 1000ML BAXH (IV SOLUTION) ×1 IMPLANT
KIT ROOM TURNOVER AP CYSTO (KITS) ×2 IMPLANT
MANIFOLD NEPTUNE II (INSTRUMENTS) ×2 IMPLANT
NS IRRIG 1000ML POUR BTL (IV SOLUTION) ×2 IMPLANT
PACK PERI GYN (CUSTOM PROCEDURE TRAY) ×2 IMPLANT
PAD ARMBOARD 7.5X6 YLW CONV (MISCELLANEOUS) ×2 IMPLANT
PAD TELFA 3X4 1S STER (GAUZE/BANDAGES/DRESSINGS) ×2 IMPLANT
SET BASIN LINEN APH (SET/KITS/TRAYS/PACK) ×2 IMPLANT
SET IRRIG Y TYPE TUR BLADDER L (SET/KITS/TRAYS/PACK) ×2 IMPLANT

## 2014-07-22 NOTE — Op Note (Signed)
Preoperative diagnosis: Menometrorrhagia                                        Dysmenorrhea                                           Postoperative diagnoses: Same as above   Procedure: Hysteroscopy, uterine curettage, endometrial ablation using Novasure  Surgeon: Florian Buff   Anesthesia: Laryngeal mask airway  Findings: The endometrium was normal.  There were no fibroid or other abnormalities.  Description of operation: The patient was taken to the operating room and placed in the supine position. She underwent general anesthesia using the laryngeal mask airway. She was placed in the dorsal lithotomy position and prepped and draped in the usual sterile fashion. A Graves speculum was placed and the anterior cervical lip was grasped with a single-tooth tenaculum. The cervix was dilated serially to allow passage of the hysteroscope. Diagnostic hysteroscopy was performed and was found to be normal. A vigorous uterine curettage was then performed and all tissue sent to pathology for evaluation.  I then proceeded to perform the Novasure endometrial ablation.  The cervical length was 9.5. The uterus sounded to  3.5 cm yielding a net length of 6.0 cm.  The endometrial cavity was 3.8 cm wide. The power was 125 watts.  The total time of therapy was 1 min 15 seconds. The array was evaluated after the procedure and tissue was adherent on all the dimensions of the surface, confirming fundal treatment as well.    All of the equipment worked well throughout the procedure.  The patient was awakened from anesthesia and taken to the recovery room in good stable condition all counts were correct. She received 2 g of Ancef and 30 mg of Toradol preoperatively. She will be discharged from the recovery room and followed up in the office in 1- 2 weeks.  Bode Pieper H 07/22/2014 8:14 AM

## 2014-07-22 NOTE — Anesthesia Postprocedure Evaluation (Signed)
  Anesthesia Post-op Note  Patient: Carrie Cook  Procedure(s) Performed: Procedure(s): DILATATION & CURETTAGE/HYSTEROSCOPY WITH NOVASURE ABLATION (N/A)  Patient Location: PACU  Anesthesia Type:General  Level of Consciousness: awake, alert , oriented and patient cooperative  Airway and Oxygen Therapy: Patient Spontanous Breathing and Patient connected to face mask oxygen  Post-op Pain: none  Post-op Assessment: Post-op Vital signs reviewed, Patient's Cardiovascular Status Stable, Respiratory Function Stable, Patent Airway, No signs of Nausea or vomiting and Pain level controlled              Post-op Vital Signs: Reviewed and stable  Last Vitals:  Filed Vitals:   07/22/14 0730  BP: 132/88  Pulse:   Temp:   Resp: 26    Complications: No apparent anesthesia complications

## 2014-07-22 NOTE — Anesthesia Procedure Notes (Signed)
Procedure Name: Intubation Date/Time: 07/22/2014 7:41 AM Performed by: Andree Elk, AMY A Pre-anesthesia Checklist: Patient identified, Patient being monitored, Timeout performed, Emergency Drugs available and Suction available Patient Re-evaluated:Patient Re-evaluated prior to inductionOxygen Delivery Method: Circle System Utilized Preoxygenation: Pre-oxygenation with 100% oxygen Intubation Type: IV induction, Rapid sequence and Cricoid Pressure applied Laryngoscope Size: 3 and Miller Grade View: Grade I Tube type: Oral Tube size: 7.0 mm Number of attempts: 1 Airway Equipment and Method: Stylet Placement Confirmation: ETT inserted through vocal cords under direct vision,  positive ETCO2 and breath sounds checked- equal and bilateral Secured at: 21 cm Tube secured with: Tape Dental Injury: Teeth and Oropharynx as per pre-operative assessment

## 2014-07-22 NOTE — Anesthesia Preprocedure Evaluation (Signed)
Anesthesia Evaluation  Patient identified by MRN, date of birth, ID band Patient awake    Reviewed: Allergy & Precautions, H&P , NPO status , Patient's Chart, lab work & pertinent test results  History of Anesthesia Complications (+) PONV and history of anesthetic complications  Airway Mallampati: III  TM Distance: <3 FB Neck ROM: Full    Dental no notable dental hx.    Pulmonary shortness of breath, asthma , sleep apnea ,  breath sounds clear to auscultation  + decreased breath sounds      Cardiovascular hypertension, Pt. on medications Normal cardiovascular examRhythm:Regular Rate:Normal     Neuro/Psych  Headaches, PSYCHIATRIC DISORDERS Anxiety Depression negative neurological ROS  negative psych ROS   GI/Hepatic Neg liver ROS, GERD-  Medicated,  Endo/Other  diabetes, Insulin DependentMorbid obesity  Renal/GU negative Renal ROS  negative genitourinary   Musculoskeletal negative musculoskeletal ROS (+)   Abdominal   Peds negative pediatric ROS (+)  Hematology negative hematology ROS (+)   Anesthesia Other Findings   Reproductive/Obstetrics negative OB ROS                             Anesthesia Physical Anesthesia Plan  ASA: III  Anesthesia Plan: General   Post-op Pain Management:    Induction: Intravenous, Rapid sequence and Cricoid pressure planned  Airway Management Planned: Oral ETT  Additional Equipment:   Intra-op Plan:   Post-operative Plan: Extubation in OR  Informed Consent: I have reviewed the patients History and Physical, chart, labs and discussed the procedure including the risks, benefits and alternatives for the proposed anesthesia with the patient or authorized representative who has indicated his/her understanding and acceptance.   Dental advisory given  Plan Discussed with: CRNA and Surgeon  Anesthesia Plan Comments:         Anesthesia Quick  Evaluation

## 2014-07-22 NOTE — Discharge Instructions (Signed)
Endometrial Ablation Endometrial ablation removes the lining of the uterus (endometrium). It is usually a same-day, outpatient treatment. Ablation helps avoid major surgery, such as surgery to remove the cervix and uterus (hysterectomy). After endometrial ablation, you will have little or no menstrual bleeding and may not be able to have children. However, if you are premenopausal, you will need to use a reliable method of birth control following the procedure because of the small chance that pregnancy can occur. There are different reasons to have this procedure, which include:  Heavy periods.  Bleeding that is causing anemia.  Irregular bleeding.  Bleeding fibroids on the lining inside the uterus if they are smaller than 3 centimeters. This procedure should not be done if:  You want children in the future.  You have severe cramps with your menstrual period.  You have precancerous or cancerous cells in your uterus.  You were recently pregnant.  You have gone through menopause.  You have had major surgery on the uterus, such as a cesarean delivery. LET [facility] CARE PROVIDER KNOW ABOUT:  Any allergies you have.  All medicines you are taking, including vitamins, herbs, eye drops, creams, and over-the-counter medicines.  Previous problems you or members of your family have had with the use of anesthetics.  Any blood disorders you have.  Previous surgeries you have had.  Medical conditions you have. RISKS AND COMPLICATIONS  Generally, this is a safe procedure. However, as with any procedure, complications can occur. Possible complications include:  Perforation of the uterus.  Bleeding.  Infection of the uterus, bladder, or vagina.  Injury to surrounding organs.  An air bubble to the lung (air embolus).  Pregnancy following the procedure.  Failure of the procedure to help the problem, requiring hysterectomy.  Decreased ability to diagnose cancer in the lining of  the uterus. BEFORE THE PROCEDURE  The lining of the uterus must be tested to make sure there is no pre-cancerous or cancer cells present.  An ultrasound may be performed to look at the size of the uterus and to check for abnormalities.  Medicines may be given to thin the lining of the uterus. PROCEDURE  During the procedure, your health care provider will use a tool called a resectoscope to help see inside your uterus. There are different ways to remove the lining of your uterus.   Radiofrequency - This method uses a radiofrequency-alternating electric current to remove the lining of the uterus.  Cryotherapy - This method uses extreme cold to freeze the lining of the uterus.  Heated-Free Liquid - This method uses heated salt (saline) solution to remove the lining of the uterus.  Microwave - This method uses high-energy microwaves to heat up the lining of the uterus to remove it.  Thermal balloon - This method involves inserting a catheter with a balloon tip into the uterus. The balloon tip is filled with heated fluid to remove the lining of the uterus. AFTER THE PROCEDURE  After your procedure, do not have sexual intercourse or insert anything into your vagina until permitted by your health care provider. After the procedure, you may experience:  Cramps.  Vaginal discharge.  Frequent urination. Document Released: 11/19/2003 Document Revised: 09/11/2012 Document Reviewed: 06/12/2012 [facility] Patient Information 2015 [place], [place]. This information is not intended to replace advice given to you by your health care provider. Make sure you discuss any questions you have with your health care provider.    PATIENT INSTRUCTIONS POST-ANESTHESIA  IMMEDIATELY FOLLOWING SURGERY:  Do not  drive or operate machinery for the first twenty four hours after surgery.  Do not make any important decisions for twenty four hours after surgery or while taking narcotic pain medications or sedatives.  If  you develop intractable nausea and vomiting or a severe headache please notify your doctor immediately.  FOLLOW-UP:  Please make an appointment with your surgeon as instructed. You do not need to follow up with anesthesia unless specifically instructed to do so.  WOUND CARE INSTRUCTIONS (if applicable):  Keep a dry clean dressing on the anesthesia/puncture wound site if there is drainage.  Once the wound has quit draining you may leave it open to air.  Generally you should leave the bandage intact for twenty four hours unless there is drainage.  If the epidural site drains for more than 36-48 hours please call the anesthesia department.  QUESTIONS?:  Please feel free to call your physician or the hospital operator if you have any questions, and they will be happy to assist you.

## 2014-07-22 NOTE — Transfer of Care (Signed)
Immediate Anesthesia Transfer of Care Note  Patient: Carrie Cook  Procedure(s) Performed: Procedure(s): DILATATION & CURETTAGE/HYSTEROSCOPY WITH NOVASURE ABLATION (N/A)  Patient Location: PACU  Anesthesia Type:General  Level of Consciousness: awake, alert , oriented and patient cooperative  Airway & Oxygen Therapy: Patient Spontanous Breathing and Patient connected to face mask oxygen  Post-op Assessment: Report given to RN and Post -op Vital signs reviewed and stable  Post vital signs: Reviewed and stable  Last Vitals:  Filed Vitals:   07/22/14 0730  BP: 132/88  Pulse:   Temp:   Resp: 26    Complications: No apparent anesthesia complications

## 2014-07-22 NOTE — H&P (Signed)
Preoperative History and Physical  Carrie Cook is a 54 y.o. G1P1 with Patient's last menstrual period was 07/10/2013. admitted for a hysteroscopy uterine curettage endometrial ablation.  I have tried to manage this peri  Menopausal menometrorrhagia with megestrol but have been unsuccessful She does have fibroids but they are not distorting the endometrium  PMH:    Past Medical History  Diagnosis Date  . Hyperlipidemia     no current med.  . Obesity   . Gastroesophageal reflux disease   . Anxiety   . H/O hiatal hernia   . PONV (postoperative nausea and vomiting)   . Migraines   . Asthma     daily and prn inhalers  . Shortness of breath     with daily activities  . Lock jaw     jaw locks open if opens mouth wide  . History of endometriosis   . Insulin dependent diabetes mellitus   . Hypertension     under control with med., has been on med. x 2 yr.  . Palpitations   . Dental crowns present   . Depression   . Sleep apnea     uses CPAP nightly SETTING IS 12  . Fibroids     UTERINE  . Neuropathy     FEET  . Degenerative joint disease     right knee, spine - STATES INTERMITTENT  NUMBNESS DOWN RT LEG WITH PROLONGED STANDING OR WALKING- THINKS RELATED TO HER SPINE PROBLEMS  . Anemia     PT HAS HAD COLONOSCOPY AND ENDOSCOPY WORK UP - NO PROBLEMS FOUND AS SOURCE OF ANEMIA -- PT HAD IRON INFUSION AT [facility] 11/13/12-AFTER SEEING HEMATOLOGIST DR. McCausland AND HE GAVE HEMATOLOGIC CLEARANCE FOR GASTRIC BYPASS SURGERY.  . Family history of anesthesia complication     pt's mother and sister have hx. of post-op N/V    PSH:     Past Surgical History  Procedure Laterality Date  . Tubal ligation  1993  . Pelvic laparoscopy  11/04/1999    with fulguration of endometriosis  . Colonoscopy  05/2009    [id number] hemorrhoids otherwise normal colon, rectum and terminal ileum  . Carpal tunnel release  01/03/2012    Procedure: CARPAL TUNNEL RELEASE;  Surgeon: Wynonia Sours, MD;   Location: [facility];  Service: Orthopedics;  Laterality: Right;  . Breath tek h pylori N/A 08/13/2012    Procedure: BREATH TEK H PYLORI;  Surgeon: Edward Jolly, MD;  Location: [facility];  Service: General;  Laterality: N/A;  . Laparoscopy      due to endometriosis  . Trigger finger release Right 09/24/2012    Procedure: RELEASE A-1 PULLEY OF RIGHT THUMB;  Surgeon: Wynonia Sours, MD;  Location: [facility];  Service: Orthopedics;  Laterality: Right;  . Esophagogastroduodenoscopy  5/013/2011    XHB:[id number] esophagus/multiple polyps removed s/p (hyperplastic). Gastritis without H.pylori.  . Carpal tunnel release right hand Right 12/13  . Colonoscopy with esophagogastroduodenoscopy (egd) N/A 10/28/2012    Procedure: COLONOSCOPY WITH ESOPHAGOGASTRODUODENOSCOPY (EGD);  Surgeon: Daneil Dolin, MD;  Location: [facility];  Service: Endoscopy;  Laterality: N/A;  7:30  . Givens capsule study N/A 10/28/2012    Procedure: GIVENS CAPSULE STUDY;  Surgeon: Daneil Dolin, MD;  Location: [facility];  Service: Endoscopy;  Laterality: N/A;  . Gastric roux-en-y N/A 11/19/2012    Procedure: LAPAROSCOPIC ROUX-EN-Y GASTRIC;  Surgeon: Edward Jolly, MD;  Location: [facility];  Service: General;  Laterality:  N/A;    POb/GynH:      OB History    Gravida Para Term Preterm AB TAB SAB Ectopic Multiple Living   1 1              SH:   History  Substance Use Topics  . Smoking status: Never Smoker   . Smokeless tobacco: Never Used  . Alcohol Use: No    FH:    Family History  Problem Relation Age of Onset  . Hypertension Father   . Hyperlipidemia Father   . COPD Mother   . Heart disease Mother   . Cancer Mother     Cervix  . Anesthesia problems Mother     post-op N/V  . Thyroid disease Sister   . Anesthesia problems Sister     post-op N/V  . Thyroid disease Paternal Uncle   . Diabetes Maternal Grandfather   . Diabetes Other      Allergies:  Allergies   Allergen Reactions  . Qvar [Beclomethasone] Shortness Of Breath    Patient states that this medication causes her to feel short of breath  . Codeine Nausea And Vomiting  . Prozac [Fluoxetine Hcl] Hives  . Dyazide [Hydrochlorothiazide W-Triamterene] Cough  . Erythromycin Nausea Only  . Lisinopril Cough  . Pravastatin Other (See Comments)    BODY ACHES, FLU-LIKE SX.  . Sulfonamide Derivatives Rash    Medications:       Current facility-administered medications:  .  ceFAZolin (ANCEF) IVPB 2 g/50 mL premix, 2 g, Intravenous, On Call to OR, Florian Buff, MD .  dexamethasone (DECADRON) injection 4 mg, 4 mg, Intravenous, Once, Lerry Liner, MD .  lactated ringers infusion, , Intravenous, Continuous, Lerry Liner, MD .  midazolam (VERSED) injection 1-2 mg, 1-2 mg, Intravenous, Q5 min PRN, Lerry Liner, MD, 2 mg at 07/22/14 2376 .  ondansetron (ZOFRAN) injection 4 mg, 4 mg, Intravenous, Once, Lerry Liner, MD  Facility-Administered Medications Ordered in Other Encounters:  .  lactated ringers infusion, , , Continuous PRN, Amy A Adams, CRNA  Review of Systems:   Review of Systems  Constitutional: Negative for fever, chills, weight loss, malaise/fatigue and diaphoresis.  HENT: Negative for hearing loss, ear pain, nosebleeds, congestion, sore throat, neck pain, tinnitus and ear discharge.   Eyes: Negative for blurred vision, double vision, photophobia, pain, discharge and redness.  Respiratory: Negative for cough, hemoptysis, sputum production, shortness of breath, wheezing and stridor.   Cardiovascular: Negative for chest pain, palpitations, orthopnea, claudication, leg swelling and PND.  Gastrointestinal: negative for abdominal pain. Negative for heartburn, nausea, vomiting, diarrhea, constipation, blood in stool and melena.  Genitourinary: Negative for dysuria, urgency, frequency, hematuria and flank pain.  Musculoskeletal: Negative for myalgias, back pain, joint pain and falls.   Skin: Negative for itching and rash.  Neurological: Negative for dizziness, tingling, tremors, sensory change, speech change, focal weakness, seizures, loss of consciousness, weakness and headaches.  Endo/Heme/Allergies: Negative for environmental allergies and polydipsia. Does not bruise/bleed easily.  Psychiatric/Behavioral: Negative for depression, suicidal ideas, hallucinations, memory loss and substance abuse. The patient is not nervous/anxious and does not have insomnia.      PHYSICAL EXAM:  Blood pressure 128/85, pulse 72, temperature 98.4 F (36.9 C), temperature source Oral, resp. rate 12, height 5\' 3"  (1.6 m), weight 192 lb (87.091 kg), last menstrual period 07/10/2013, SpO2 97 %.    Vitals reviewed. Constitutional: She is oriented to person, place, and time. She appears well-developed and well-nourished.  HENT:  Head: Normocephalic  and atraumatic.  Right Ear: External ear normal.  Left Ear: External ear normal.  Nose: Nose normal.  Mouth/Throat: Oropharynx is clear and moist.  Eyes: Conjunctivae and EOM are normal. Pupils are equal, round, and reactive to light. Right eye exhibits no discharge. Left eye exhibits no discharge. No scleral icterus.  Neck: Normal range of motion. Neck supple. No tracheal deviation present. No thyromegaly present.  Cardiovascular: Normal rate, regular rhythm, normal heart sounds and intact distal pulses.  Exam reveals no gallop and no friction rub.   No murmur heard. Respiratory: Effort normal and breath sounds normal. No respiratory distress. She has no wheezes. She has no rales. She exhibits no tenderness.  GI: Soft. Bowel sounds are normal. She exhibits no distension and no mass. There is tenderness. There is no rebound and no guarding.  Genitourinary:       Vulva is normal without lesions Vagina is pink moist without discharge Cervix normal in appearance and pap is normal Uterus is mass fibroids and tender Adnexa is negative with normal  sized ovaries by sonogram  Musculoskeletal: Normal range of motion. She exhibits no edema and no tenderness.  Neurological: She is alert and oriented to person, place, and time. She has normal reflexes. She displays normal reflexes. No cranial nerve deficit. She exhibits normal muscle tone. Coordination normal.  Skin: Skin is warm and dry. No rash noted. No erythema. No pallor.  Psychiatric: She has a normal mood and affect. Her behavior is normal. Judgment and thought content normal.    Labs: Results for orders placed or performed during the hospital encounter of 07/22/14 (from the past 336 hour(s))  Glucose, capillary   Collection Time: 07/22/14  6:50 AM  Result Value Ref Range   Glucose-Capillary 151 (H) 65 - 99 mg/dL  Results for orders placed or performed during the hospital encounter of 07/16/14 (from the past 336 hour(s))  Urinalysis, Routine w reflex microscopic (not at [facility])   Collection Time: 07/16/14 12:44 PM  Result Value Ref Range   Color, Urine YELLOW YELLOW   APPearance CLEAR CLEAR   Specific Gravity, Urine 1.015 1.005 - 1.030   pH 5.5 5.0 - 8.0   Glucose, UA NEGATIVE NEGATIVE mg/dL   Hgb urine dipstick LARGE (A) NEGATIVE   Bilirubin Urine NEGATIVE NEGATIVE   Ketones, ur NEGATIVE NEGATIVE mg/dL   Protein, ur NEGATIVE NEGATIVE mg/dL   Urobilinogen, UA 0.2 0.0 - 1.0 mg/dL   Nitrite NEGATIVE NEGATIVE   Leukocytes, UA NEGATIVE NEGATIVE  Urine microscopic-add on   Collection Time: 07/16/14 12:44 PM  Result Value Ref Range   Squamous Epithelial / LPF FEW (A) RARE   WBC, UA 11-20 <3 WBC/hpf   RBC / HPF TOO NUMEROUS TO COUNT <3 RBC/hpf   Bacteria, UA FEW (A) RARE  CBC   Collection Time: 07/16/14  1:00 PM  Result Value Ref Range   WBC 10.3 4.0 - 10.5 K/uL   RBC 4.89 3.87 - 5.11 MIL/uL   Hemoglobin 16.1 (H) 12.0 - 15.0 g/dL   HCT 44.9 36.0 - 46.0 %   MCV 91.8 78.0 - 100.0 fL   MCH 32.9 26.0 - 34.0 pg   MCHC 35.9 30.0 - 36.0 g/dL   RDW 11.8 11.5 - 15.5 %   Platelets  229 150 - 400 K/uL  Comprehensive metabolic panel   Collection Time: 07/16/14  1:00 PM  Result Value Ref Range   Sodium 136 135 - 145 mmol/L   Potassium 4.0 3.5 - 5.1 mmol/L  Chloride 108 101 - 111 mmol/L   CO2 21 (L) 22 - 32 mmol/L   Glucose, Bld 160 (H) 65 - 99 mg/dL   BUN 13 6 - 20 mg/dL   Creatinine, Ser 0.58 0.44 - 1.00 mg/dL   Calcium 9.1 8.9 - 10.3 mg/dL   Total Protein 6.5 6.5 - 8.1 g/dL   Albumin 4.0 3.5 - 5.0 g/dL   AST 17 15 - 41 U/L   ALT 21 14 - 54 U/L   Alkaline Phosphatase 99 38 - 126 U/L   Total Bilirubin 0.4 0.3 - 1.2 mg/dL   GFR calc non Af Amer >60 >60 mL/min   GFR calc Af Amer >60 >60 mL/min   Anion gap 7 5 - 15  hCG, quantitative, pregnancy   Collection Time: 07/16/14  1:00 PM  Result Value Ref Range   hCG, Beta Chain, Quant, S <1 <5 mIU/mL    EKG: Orders placed or performed during the hospital encounter of 07/16/14  . EKG 12-Lead  . EKG 12-Lead    Imaging Studies: No results found.    Assessment: Menometrorrhagia Dysmenorrhea   Patient Active Problem List   Diagnosis Date Noted  . Iron deficiency anemia 10/16/2013  . Fibroids 08/21/2013  . Excessive or frequent menstruation 06/19/2013  . Morbid obesity 07/12/2012  . Obstructive sleep apnea 06/21/2012  . Chest pain   . Hyperlipidemia   . Type II or unspecified type diabetes mellitus with unspecified complication, uncontrolled   . Obesity   . Laboratory test   . Nephrolithiasis   . Asthma   . Endometriosis   . Hypertension   . Iron deficiency anemia, unspecified 05/11/2009  . GERD 05/11/2009    Plan: Hysteroscopy uterine curettage endometrial ablation  EURE,LUTHER H 07/22/2014 7:23 AM

## 2014-07-24 ENCOUNTER — Encounter (HOSPITAL_COMMUNITY): Payer: Self-pay | Admitting: Obstetrics & Gynecology

## 2014-07-29 ENCOUNTER — Ambulatory Visit (INDEPENDENT_AMBULATORY_CARE_PROVIDER_SITE_OTHER): Payer: Medicare Other | Admitting: Obstetrics & Gynecology

## 2014-07-29 ENCOUNTER — Encounter: Payer: Self-pay | Admitting: Obstetrics & Gynecology

## 2014-07-29 VITALS — BP 120/80 | HR 72 | Wt 193.0 lb

## 2014-07-29 DIAGNOSIS — Z9889 Other specified postprocedural states: Secondary | ICD-10-CM | POA: Diagnosis not present

## 2014-07-29 NOTE — Progress Notes (Signed)
Patient ID: Carrie Cook, female   DOB: 1960-07-05, 54 y.o.   MRN: [id number]  HPI: Patient returns for routine postoperative follow-up having undergone hysteroscopy uterine curettage endometrial ablation on 07/22/2014.  The patient's immediate postoperative recovery has been unremarkable. Since hospital discharge the patient reports vaginal bleeding.   Current Outpatient Prescriptions: estradiol (ESTRACE) 2 MG tablet, TAKE ONE TABLET BY MOUTH ONCE DAILY, Disp: 30 tablet, Rfl: 11 fluticasone (FLOVENT HFA) 220 MCG/ACT inhaler, Inhale 2 puffs into the lungs 2 (two) times daily., Disp: 1 Inhaler, Rfl: 12 insulin NPH-regular (NOVOLIN 70/30) (70-30) 100 UNIT/ML injection, Inject 10-15 Units into the skin 2 (two) times daily. 30 units in am, 25 units in pm (Patient taking differently: Inject 15 Units into the skin 2 (two) times daily. ), Disp: 10 mL, Rfl: 12 loratadine (CLARITIN) 10 MG tablet, Take 10 mg by mouth daily., Disp: , Rfl:  losartan (COZAAR) 50 MG tablet, Take 1 tablet (50 mg total) by mouth daily., Disp: 30 tablet, Rfl: 6 metFORMIN (GLUCOPHAGE) 500 MG tablet, TAKE 1 TABLET BY MOUTH TWICE DAILY WITH A MEAL, Disp: 60 tablet, Rfl: 1 metoprolol tartrate (LOPRESSOR) 25 MG tablet, TAKE 1 TABLET BY MOUTH TWICE DAILY, NEED OFFICE VISIT, Disp: 180 tablet, Rfl: 1 Multiple Vitamin (MULTIVITAMIN WITH MINERALS) TABS tablet, Take 2 tablets by mouth daily. , Disp: , Rfl:  progesterone (PROMETRIUM) 200 MG capsule, Take 1 nightly, Disp: 30 capsule, Rfl: 11 rosuvastatin (CRESTOR) 5 MG tablet, Take 1 tablet (5 mg total) by mouth daily., Disp: 30 tablet, Rfl: 5 venlafaxine (EFFEXOR) 75 MG tablet, Take 75 mg by mouth 2 (two) times daily with a meal. , Disp: , Rfl: 1 vitamin B-12 (CYANOCOBALAMIN) 500 MCG tablet, Take 1,000 mcg by mouth daily. , Disp: , Rfl:  albuterol (PROVENTIL HFA;VENTOLIN HFA) 108 (90 BASE) MCG/ACT inhaler, Inhale 2 puffs into the lungs every 6 (six) hours as needed for wheezing. (Patient  not taking: Reported on 07/29/2014), Disp: 1 Inhaler, Rfl: 6 ketorolac (TORADOL) 10 MG tablet, Take 1 tablet (10 mg total) by mouth every 8 (eight) hours as needed. (Patient not taking: Reported on 07/29/2014), Disp: 15 tablet, Rfl: 0 oxyCODONE-acetaminophen (ROXICET) 5-325 MG per tablet, Take 1-2 tablets by mouth every 4 (four) hours as needed for severe pain. (Patient not taking: Reported on 07/29/2014), Disp: 30 tablet, Rfl: 0  No current facility-administered medications for this visit.    Blood pressure 120/80, pulse 72, weight 193 lb (87.544 kg), last menstrual period 07/10/2013.  Physical Exam: NEFG Cervix normal  Uterus normal post ablation adnexa negative  Minimal bleeding  Diagnostic Tests:   Pathology: benign  Impression: S/P endo ablation  Plan: yearly  Follow up: 1  years  Florian Buff, MD

## 2014-08-06 ENCOUNTER — Ambulatory Visit: Payer: Medicare Other | Admitting: Dietician

## 2014-08-10 ENCOUNTER — Other Ambulatory Visit: Payer: Self-pay | Admitting: Family Medicine

## 2014-08-10 ENCOUNTER — Other Ambulatory Visit: Payer: Self-pay | Admitting: *Deleted

## 2014-08-10 MED ORDER — ESTRADIOL 2 MG PO TABS
2.0000 mg | ORAL_TABLET | Freq: Every day | ORAL | Status: DC
Start: 2014-08-10 — End: 2015-07-06

## 2014-08-17 DIAGNOSIS — F339 Major depressive disorder, recurrent, unspecified: Secondary | ICD-10-CM | POA: Diagnosis not present

## 2014-09-07 ENCOUNTER — Encounter: Payer: Self-pay | Admitting: Family Medicine

## 2014-09-07 ENCOUNTER — Ambulatory Visit (INDEPENDENT_AMBULATORY_CARE_PROVIDER_SITE_OTHER): Payer: Medicare Other | Admitting: Family Medicine

## 2014-09-07 VITALS — BP 118/88 | Ht 63.0 in | Wt 193.0 lb

## 2014-09-07 DIAGNOSIS — D649 Anemia, unspecified: Secondary | ICD-10-CM | POA: Diagnosis not present

## 2014-09-07 DIAGNOSIS — E119 Type 2 diabetes mellitus without complications: Secondary | ICD-10-CM

## 2014-09-07 DIAGNOSIS — I10 Essential (primary) hypertension: Secondary | ICD-10-CM | POA: Diagnosis not present

## 2014-09-07 DIAGNOSIS — E785 Hyperlipidemia, unspecified: Secondary | ICD-10-CM

## 2014-09-07 DIAGNOSIS — Z79899 Other long term (current) drug therapy: Secondary | ICD-10-CM | POA: Diagnosis not present

## 2014-09-07 LAB — POCT HEMOGLOBIN: Hemoglobin: 15 g/dL (ref 12.2–16.2)

## 2014-09-07 LAB — POCT GLYCOSYLATED HEMOGLOBIN (HGB A1C): Hemoglobin A1C: 6

## 2014-09-07 MED ORDER — ATORVASTATIN CALCIUM 10 MG PO TABS: 10.0000 mg | ORAL_TABLET | Freq: Every day | ORAL | Status: DC

## 2014-09-07 NOTE — Progress Notes (Signed)
   Subjective:    Patient ID: Carrie Cook, female    DOB: 11/01/1960, 54 y.o.   MRN: [id number]  Diabetes She presents for her follow-up diabetic visit. She has type 2 diabetes mellitus. Pertinent negatives for hypoglycemia include no confusion. Pertinent negatives for diabetes include no chest pain, no fatigue, no polydipsia, no polyphagia and no weakness. Current diabetic treatment includes insulin injections. She does not see a podiatrist.Eye exam is current.  pt does not check blood sugars. A1C 6.0.  Pt has concerns about fatigue and anemia. Hb today 15.0 Patient states she's taking her blood pressure medicine it's been under decent control She relates occasional flareups of her breathing issues has a hard time affording her steroid inhaler Patient with intermittent vaginal bleeding being followed by gynecology but she is concerned about being anemic Patient was unable to continue her cholesterol medicine because of the cost  Review of Systems  Constitutional: Negative for activity change, appetite change and fatigue.  HENT: Negative for congestion.   Respiratory: Negative for cough.   Cardiovascular: Negative for chest pain.  Gastrointestinal: Negative for abdominal pain.  Endocrine: Negative for polydipsia and polyphagia.  Neurological: Negative for weakness.  Psychiatric/Behavioral: Negative for confusion.       Objective:   Physical Exam  Constitutional: She appears well-nourished. No distress.  Cardiovascular: Normal rate, regular rhythm and normal heart sounds.   No murmur heard. Pulmonary/Chest: Effort normal and breath sounds normal. No respiratory distress.  Musculoskeletal: She exhibits no edema.  Lymphadenopathy:    She has no cervical adenopathy.  Neurological: She is alert. She exhibits normal muscle tone.  Psychiatric: Her behavior is normal.  Vitals reviewed.         Assessment & Plan:  Dysfunctional uterine bleeding-she is had ablation done. Her  hemoglobin looks good today. Her gynecologist currently has her on medications to try to control this. The patient is aware that as time moves 4 she will need to try to come off of the antiestrogen's.  HTN decent control continue current medications  Diabetes fair control watch diet better patient under a significant amount of stress taking care of grandchildren does not have much time to watch her diet and exercise  Hyperlipidemia-could not afford Crestor. Start Lipitor. Follow-up lab work in approximately 4 months  Morbid obesity importance of exercise watching diet discussed

## 2014-09-09 ENCOUNTER — Other Ambulatory Visit: Payer: Self-pay | Admitting: Family Medicine

## 2014-09-29 ENCOUNTER — Other Ambulatory Visit: Payer: Self-pay | Admitting: Obstetrics & Gynecology

## 2014-10-10 ENCOUNTER — Other Ambulatory Visit: Payer: Self-pay | Admitting: Family Medicine

## 2014-11-18 NOTE — Progress Notes (Signed)
Patient ID: Carrie Cook, female   DOB: 02-10-1960, 54 y.o.   MRN: [id number] Preoperative History and Physical  Carrie Cook is a 54 y.o. G1P1 with Patient's last menstrual period was 07/10/2013. admitted for a hysteroscopy uterine curettage endometrial ablation.  I have tried to manage this peri Menopausal menometrorrhagia with megestrol but have been unsuccessful She does have fibroids but they are not distorting the endometrium  PMH:  Past Medical History  Diagnosis Date  . Hyperlipidemia     no current med.  . Obesity   . Gastroesophageal reflux disease   . Anxiety   . H/O hiatal hernia   . PONV (postoperative nausea and vomiting)   . Migraines   . Asthma     daily and prn inhalers  . Shortness of breath     with daily activities  . Lock jaw     jaw locks open if opens mouth wide  . History of endometriosis   . Insulin dependent diabetes mellitus   . Hypertension     under control with med., has been on med. x 2 yr.  . Palpitations   . Dental crowns present   . Depression   . Sleep apnea     uses CPAP nightly SETTING IS 12  . Fibroids     UTERINE  . Neuropathy     FEET  . Degenerative joint disease     right knee, spine - STATES INTERMITTENT NUMBNESS DOWN RT LEG WITH PROLONGED STANDING OR WALKING- THINKS RELATED TO HER SPINE PROBLEMS  . Anemia     PT HAS HAD COLONOSCOPY AND ENDOSCOPY WORK UP - NO PROBLEMS FOUND AS SOURCE OF ANEMIA -- PT HAD IRON INFUSION AT [facility] 11/13/12-AFTER SEEING HEMATOLOGIST DR. Royston AND HE GAVE HEMATOLOGIC CLEARANCE FOR GASTRIC BYPASS SURGERY.  . Family history of anesthesia complication     pt's mother and sister have hx. of post-op N/V    PSH:  Past Surgical History  Procedure Laterality Date  . Tubal ligation  1993  . Pelvic laparoscopy  11/04/1999    with fulguration of endometriosis  .  Colonoscopy  05/2009    LOV:FIEPPIRJ hemorrhoids otherwise normal colon, rectum and terminal ileum  . Carpal tunnel release  01/03/2012    Procedure: CARPAL TUNNEL RELEASE; Surgeon: Wynonia Sours, MD; Location: [facility]; Service: Orthopedics; Laterality: Right;  . Breath tek h pylori N/A 08/13/2012    Procedure: BREATH TEK H PYLORI; Surgeon: Edward Jolly, MD; Location: [facility]; Service: General; Laterality: N/A;  . Laparoscopy      due to endometriosis  . Trigger finger release Right 09/24/2012    Procedure: RELEASE A-1 PULLEY OF RIGHT THUMB; Surgeon: Wynonia Sours, MD; Location: [place]; Service: Orthopedics; Laterality: Right;  . Esophagogastroduodenoscopy  5/013/2011    JOA:CZYSAY esophagus/multiple polyps removed s/p (hyperplastic). Gastritis without H.pylori.  . Carpal tunnel release right hand Right 12/13  . Colonoscopy with esophagogastroduodenoscopy (egd) N/A 10/28/2012    Procedure: COLONOSCOPY WITH ESOPHAGOGASTRODUODENOSCOPY (EGD); Surgeon: Daneil Dolin, MD; Location: [facility]; Service: Endoscopy; Laterality: N/A; 7:30  . Givens capsule study N/A 10/28/2012    Procedure: GIVENS CAPSULE STUDY; Surgeon: Daneil Dolin, MD; [facility]: [facility]; Service: Endoscopy; Laterality: N/A;  . Gastric roux-en-y N/A 11/19/2012    Procedure: LAPAROSCOPIC ROUX-EN-Y GASTRIC; Surgeon: Edward Jolly, MD; Location: [facility]; Service: General; Laterality: N/A;    POb/GynH:  OB History    Gravida Para Term Preterm AB  TAB SAB Ectopic Multiple Living   1 1              SH:  History  Substance Use Topics  . Smoking status: Never Smoker   . Smokeless tobacco: Never Used  . Alcohol Use: No    FH:  Family History  Problem Relation Age of Onset  . Hypertension Father   . Hyperlipidemia Father   .  COPD Mother   . Heart disease Mother   . Cancer Mother     Cervix  . Anesthesia problems Mother     post-op N/V  . Thyroid disease Sister   . Anesthesia problems Sister     post-op N/V  . Thyroid disease Paternal Uncle   . Diabetes Maternal Grandfather   . Diabetes Other      Allergies:  Allergies  Allergen Reactions  . Qvar [Beclomethasone] Shortness Of Breath    Patient states that this medication causes her to feel short of breath  . Codeine Nausea And Vomiting  . Prozac [Fluoxetine Hcl] Hives  . Dyazide [Hydrochlorothiazide W-Triamterene] Cough  . Erythromycin Nausea Only  . Lisinopril Cough  . Pravastatin Other (See Comments)    BODY ACHES, FLU-LIKE SX.  . Sulfonamide Derivatives Rash    Medications:  Current facility-administered medications:  . ceFAZolin (ANCEF) IVPB 2 g/50 mL premix, 2 g, Intravenous, On Call to OR, Florian Buff, MD . dexamethasone (DECADRON) injection 4 mg, 4 mg, Intravenous, Once, Lerry Liner, MD . lactated ringers infusion, , Intravenous, Continuous, Lerry Liner, MD . midazolam (VERSED) injection 1-2 mg, 1-2 mg, Intravenous, Q5 min PRN, Lerry Liner, MD, 2 mg at 07/22/14 9357 . ondansetron (ZOFRAN) injection 4 mg, 4 mg, Intravenous, Once, Lerry Liner, MD  Facility-Administered Medications Ordered in Other Encounters:  . lactated ringers infusion, , , Continuous PRN, Amy A Adams, CRNA  Review of Systems:   Review of Systems  Constitutional: Negative for fever, chills, weight loss, malaise/fatigue and diaphoresis.  HENT: Negative for hearing loss, ear pain, nosebleeds, congestion, sore throat, neck pain, tinnitus and ear discharge.  Eyes: Negative for blurred vision, double vision, photophobia, pain, discharge and redness.  Respiratory: Negative for cough, hemoptysis, sputum production, shortness of breath, wheezing and stridor.   Cardiovascular: Negative for chest pain, palpitations, orthopnea, claudication, leg swelling and PND.  Gastrointestinal: negative for abdominal pain. Negative for heartburn, nausea, vomiting, diarrhea, constipation, blood in stool and melena.  Genitourinary: Negative for dysuria, urgency, frequency, hematuria and flank pain.  Musculoskeletal: Negative for myalgias, back pain, joint pain and falls.  Skin: Negative for itching and rash.  Neurological: Negative for dizziness, tingling, tremors, sensory change, speech change, focal weakness, seizures, loss of consciousness, weakness and headaches.  Endo/Heme/Allergies: Negative for environmental allergies and polydipsia. Does not bruise/bleed easily.  Psychiatric/Behavioral: Negative for depression, suicidal ideas, hallucinations, memory loss and substance abuse. The patient is not nervous/anxious and does not have insomnia.     PHYSICAL EXAM:  Blood pressure 128/85, pulse 72, temperature 98.4 F (36.9 C), temperature source Oral, resp. rate 12, height 5\' 3"  (1.6 m), weight 192 lb (87.091 kg), last menstrual period 07/10/2013, SpO2 97 %.   Vitals reviewed. Constitutional: She is oriented to person, place, and time. She appears well-developed and well-nourished.  HENT:  Head: Normocephalic and atraumatic.  Right Ear: External ear normal.  Left Ear: External ear normal.  Nose: Nose normal.  Mouth/Throat: Oropharynx is clear and moist.  Eyes: Conjunctivae and EOM are normal. Pupils are equal, round, and reactive  to light. Right eye exhibits no discharge. Left eye exhibits no discharge. No scleral icterus.  Neck: Normal range of motion. Neck supple. No tracheal deviation present. No thyromegaly present.  Cardiovascular: Normal rate, regular rhythm, normal heart sounds and intact distal pulses. Exam reveals no gallop and no friction rub.  No murmur heard. Respiratory: Effort normal and breath sounds normal. No respiratory distress. She  has no wheezes. She has no rales. She exhibits no tenderness.  GI: Soft. Bowel sounds are normal. She exhibits no distension and no mass. There is tenderness. There is no rebound and no guarding.  Genitourinary:   Vulva is normal without lesions Vagina is pink moist without discharge Cervix normal in appearance and pap is normal Uterus is mass fibroids and tender Adnexa is negative with normal sized ovaries by sonogram  Musculoskeletal: Normal range of motion. She exhibits no edema and no tenderness.  Neurological: She is alert and oriented to person, place, and time. She has normal reflexes. She displays normal reflexes. No cranial nerve deficit. She exhibits normal muscle tone. Coordination normal.  Skin: Skin is warm and dry. No rash noted. No erythema. No pallor.  Psychiatric: She has a normal mood and affect. Her behavior is normal. Judgment and thought content normal.    Labs: Results for orders placed or performed during the hospital encounter of 07/22/14 (from the past 336 hour(s))  Glucose, capillary   Collection Time: 07/22/14 6:50 AM  Result Value Ref Range   Glucose-Capillary 151 (H) 65 - 99 mg/dL  Results for orders placed or performed during the hospital encounter of 07/16/14 (from the past 336 hour(s))  Urinalysis, Routine w reflex microscopic (not at [facility])   Collection Time: 07/16/14 12:44 PM  Result Value Ref Range   Color, Urine YELLOW YELLOW   APPearance CLEAR CLEAR   Specific Gravity, Urine 1.015 1.005 - 1.030   pH 5.5 5.0 - 8.0   Glucose, UA NEGATIVE NEGATIVE mg/dL   Hgb urine dipstick LARGE (A) NEGATIVE   Bilirubin Urine NEGATIVE NEGATIVE   Ketones, ur NEGATIVE NEGATIVE mg/dL   Protein, ur NEGATIVE NEGATIVE mg/dL   Urobilinogen, UA 0.2 0.0 - 1.0 mg/dL   Nitrite NEGATIVE NEGATIVE   Leukocytes, UA NEGATIVE NEGATIVE  Urine microscopic-add on   Collection Time: 07/16/14 12:44 PM  Result  Value Ref Range   Squamous Epithelial / LPF FEW (A) RARE   WBC, UA 11-20 <3 WBC/hpf   RBC / HPF TOO NUMEROUS TO COUNT <3 RBC/hpf   Bacteria, UA FEW (A) RARE  CBC   Collection Time: 07/16/14 1:00 PM  Result Value Ref Range   WBC 10.3 4.0 - 10.5 K/uL   RBC 4.89 3.87 - 5.11 MIL/uL   Hemoglobin 16.1 (H) 12.0 - 15.0 g/dL   HCT 44.9 36.0 - 46.0 %   MCV 91.8 78.0 - 100.0 fL   MCH 32.9 26.0 - 34.0 pg   MCHC 35.9 30.0 - 36.0 g/dL   RDW 11.8 11.5 - 15.5 %   Platelets 229 150 - 400 K/uL  Comprehensive metabolic panel   Collection Time: 07/16/14 1:00 PM  Result Value Ref Range   Sodium 136 135 - 145 mmol/L   Potassium 4.0 3.5 - 5.1 mmol/L   Chloride 108 101 - 111 mmol/L   CO2 21 (L) 22 - 32 mmol/L   Glucose, Bld 160 (H) 65 - 99 mg/dL   BUN 13 6 - 20 mg/dL   Creatinine, Ser 0.58 0.44 - 1.00 mg/dL   Calcium 9.1  8.9 - 10.3 mg/dL   Total Protein 6.5 6.5 - 8.1 g/dL   Albumin 4.0 3.5 - 5.0 g/dL   AST 17 15 - 41 U/L   ALT 21 14 - 54 U/L   Alkaline Phosphatase 99 38 - 126 U/L   Total Bilirubin 0.4 0.3 - 1.2 mg/dL   GFR calc non Af Amer >60 >60 mL/min   GFR calc Af Amer >60 >60 mL/min   Anion gap 7 5 - 15  hCG, quantitative, pregnancy   Collection Time: 07/16/14 1:00 PM  Result Value Ref Range   hCG, Beta Chain, Quant, S <1 <5 mIU/mL    EKG: Orders placed or performed during the hospital encounter of 07/16/14  . EKG 12-Lead  . EKG 12-Lead    Imaging Studies:  Imaging Results    No results found.      Assessment: Menometrorrhagia Dysmenorrhea   Patient Active Problem List   Diagnosis Date Noted  . Iron deficiency anemia 10/16/2013  . Fibroids 08/21/2013  . Excessive or frequent menstruation 06/19/2013  . Morbid obesity 07/12/2012  . Obstructive sleep apnea 06/21/2012  . Chest pain   .  Hyperlipidemia   . Type II or unspecified type diabetes mellitus with unspecified complication, uncontrolled   . Obesity   . Laboratory test   . Nephrolithiasis   . Asthma   . Endometriosis   . Hypertension   . Iron deficiency anemia, unspecified 05/11/2009  . GERD 05/11/2009    Plan: Hysteroscopy uterine curettage endometrial ablation

## 2014-11-24 DIAGNOSIS — F339 Major depressive disorder, recurrent, unspecified: Secondary | ICD-10-CM | POA: Diagnosis not present

## 2014-12-28 DIAGNOSIS — E119 Type 2 diabetes mellitus without complications: Secondary | ICD-10-CM | POA: Diagnosis not present

## 2014-12-28 DIAGNOSIS — I10 Essential (primary) hypertension: Secondary | ICD-10-CM | POA: Diagnosis not present

## 2014-12-28 DIAGNOSIS — Z79899 Other long term (current) drug therapy: Secondary | ICD-10-CM | POA: Diagnosis not present

## 2014-12-28 DIAGNOSIS — E785 Hyperlipidemia, unspecified: Secondary | ICD-10-CM | POA: Diagnosis not present

## 2014-12-29 LAB — LIPID PANEL
Chol/HDL Ratio: 3.8 ratio units (ref 0.0–4.4)
Cholesterol, Total: 160 mg/dL (ref 100–199)
HDL: 42 mg/dL (ref >39)
LDL Calculated: 81 mg/dL (ref 0–99)
Triglycerides: 186 mg/dL — ABNORMAL HIGH (ref 0–149)
VLDL Cholesterol Cal: 37 mg/dL (ref 5–40)

## 2014-12-29 LAB — BASIC METABOLIC PANEL
BUN/Creatinine Ratio: 21 (ref 9–23)
BUN: 13 mg/dL (ref 6–24)
CO2: 23 mmol/L (ref 18–29)
Calcium: 8.9 mg/dL (ref 8.7–10.2)
Chloride: 101 mmol/L (ref 97–106)
Creatinine, Ser: 0.62 mg/dL (ref 0.57–1.00)
GFR calc Af Amer: 120 mL/min/{1.73_m2} (ref >59)
GFR calc non Af Amer: 104 mL/min/{1.73_m2} (ref >59)
Glucose: 152 mg/dL — ABNORMAL HIGH (ref 65–99)
Potassium: 4.2 mmol/L (ref 3.5–5.2)
Sodium: 138 mmol/L (ref 136–144)

## 2014-12-29 LAB — HEMOGLOBIN A1C
Est. average glucose Bld gHb Est-mCnc: 137 mg/dL
Hgb A1c MFr Bld: 6.4 % — ABNORMAL HIGH (ref 4.8–5.6)

## 2014-12-29 LAB — MICROALBUMIN / CREATININE URINE RATIO
Creatinine, Urine: 168.7 mg/dL
MICROALB/CREAT RATIO: 3.5 mg/g creat (ref 0.0–30.0)
Microalbumin, Urine: 5.9 ug/mL

## 2014-12-29 LAB — HEPATIC FUNCTION PANEL
ALT: 28 IU/L (ref 0–32)
AST: 21 IU/L (ref 0–40)
Albumin: 4 g/dL (ref 3.5–5.5)
Alkaline Phosphatase: 125 IU/L — ABNORMAL HIGH (ref 39–117)
Bilirubin Total: 0.4 mg/dL (ref 0.0–1.2)
Bilirubin, Direct: 0.13 mg/dL (ref 0.00–0.40)
Total Protein: 6.2 g/dL (ref 6.0–8.5)

## 2014-12-30 ENCOUNTER — Other Ambulatory Visit: Payer: Self-pay | Admitting: Family Medicine

## 2015-01-06 ENCOUNTER — Encounter: Payer: Self-pay | Admitting: Family Medicine

## 2015-01-06 ENCOUNTER — Ambulatory Visit (INDEPENDENT_AMBULATORY_CARE_PROVIDER_SITE_OTHER): Payer: Medicare Other | Admitting: Family Medicine

## 2015-01-06 VITALS — BP 128/80 | Ht 63.0 in | Wt 188.0 lb

## 2015-01-06 DIAGNOSIS — E785 Hyperlipidemia, unspecified: Secondary | ICD-10-CM | POA: Diagnosis not present

## 2015-01-06 DIAGNOSIS — E119 Type 2 diabetes mellitus without complications: Secondary | ICD-10-CM

## 2015-01-06 DIAGNOSIS — Z23 Encounter for immunization: Secondary | ICD-10-CM | POA: Diagnosis not present

## 2015-01-06 DIAGNOSIS — E669 Obesity, unspecified: Secondary | ICD-10-CM

## 2015-01-06 DIAGNOSIS — I10 Essential (primary) hypertension: Secondary | ICD-10-CM

## 2015-01-06 DIAGNOSIS — Z794 Long term (current) use of insulin: Secondary | ICD-10-CM

## 2015-01-06 DIAGNOSIS — J4521 Mild intermittent asthma with (acute) exacerbation: Secondary | ICD-10-CM | POA: Diagnosis not present

## 2015-01-06 NOTE — Progress Notes (Signed)
   Subjective:    Patient ID: Carrie Cook, female    DOB: 05/21/1960, 54 y.o.   MRN: [id number]  Diabetes She presents for her follow-up diabetic visit. She has type 2 diabetes mellitus. There are no hypoglycemic associated symptoms. Pertinent negatives for hypoglycemia include no confusion. There are no diabetic associated symptoms. Pertinent negatives for diabetes include no chest pain, no fatigue, no polydipsia, no polyphagia and no weakness. There are no hypoglycemic complications. Symptoms are stable. There are no diabetic complications. There are no known risk factors for coronary artery disease. Current diabetic treatment includes oral agent (monotherapy). She is compliant with treatment all of the time.   Patient had blood work completed. Results in the system.   Patient has no concerns today.   Last diabetic eye exam- 12/26/13   patient states she's not always following her diet as well as she should during the day occasionally skips meals occasionally has low sugar spells   Patient is taking blood pressure medicine cholesterol medicine and asthma medicine   Review of Systems  Constitutional: Negative for activity change, appetite change and fatigue.  HENT: Negative for congestion.   Respiratory: Negative for cough.   Cardiovascular: Negative for chest pain.  Gastrointestinal: Negative for abdominal pain.  Endocrine: Negative for polydipsia and polyphagia.  Neurological: Negative for weakness.  Psychiatric/Behavioral: Negative for confusion.       Objective:   Physical Exam  Constitutional: She appears well-nourished. No distress.  Cardiovascular: Normal rate, regular rhythm and normal heart sounds.   No murmur heard. Pulmonary/Chest: Effort normal and breath sounds normal. No respiratory distress.  Musculoskeletal: She exhibits no edema.  Lymphadenopathy:    She has no cervical adenopathy.  Neurological: She is alert. She exhibits normal muscle tone.  Psychiatric:  Her behavior is normal.  Vitals reviewed.    25 minutes was spent with the patient. Greater than half the time was spent in discussion and answering questions and counseling regarding the issues that the patient came in for today.      Assessment & Plan:   diabetes A1c has moved up she is only using insulin once in the morning a recommend that she start using this twice per day sickness some readings in the near future   hyperlipidemia use Lipitor as directed results good   asthma issues under good control continue current measures   emotions doing well continue Effexor   Blood pressure doing well continue current medicine

## 2015-01-07 ENCOUNTER — Ambulatory Visit: Payer: Medicare Other | Admitting: Family Medicine

## 2015-02-03 ENCOUNTER — Other Ambulatory Visit: Payer: Self-pay | Admitting: Family Medicine

## 2015-02-15 ENCOUNTER — Encounter: Payer: Self-pay | Admitting: Nurse Practitioner

## 2015-02-15 ENCOUNTER — Ambulatory Visit (INDEPENDENT_AMBULATORY_CARE_PROVIDER_SITE_OTHER): Payer: Medicare Other | Admitting: Nurse Practitioner

## 2015-02-15 VITALS — BP 136/92 | HR 76 | Temp 98.7°F | Wt 188.2 lb

## 2015-02-15 DIAGNOSIS — J209 Acute bronchitis, unspecified: Secondary | ICD-10-CM

## 2015-02-15 DIAGNOSIS — J329 Chronic sinusitis, unspecified: Secondary | ICD-10-CM | POA: Diagnosis not present

## 2015-02-15 DIAGNOSIS — J4541 Moderate persistent asthma with (acute) exacerbation: Secondary | ICD-10-CM | POA: Diagnosis not present

## 2015-02-15 DIAGNOSIS — B370 Candidal stomatitis: Secondary | ICD-10-CM | POA: Diagnosis not present

## 2015-02-15 MED ORDER — ALBUTEROL SULFATE (2.5 MG/3ML) 0.083% IN NEBU
2.5000 mg | INHALATION_SOLUTION | Freq: Once | RESPIRATORY_TRACT | Status: AC
  Administered 2015-02-15: 2.5 mg via RESPIRATORY_TRACT

## 2015-02-15 MED ORDER — PREDNISONE 20 MG PO TABS: ORAL_TABLET | ORAL | Status: DC

## 2015-02-15 MED ORDER — FLUCONAZOLE 100 MG PO TABS: ORAL_TABLET | ORAL | Status: DC

## 2015-02-15 MED ORDER — AMOXICILLIN-POT CLAVULANATE 875-125 MG PO TABS: 1.0000 | ORAL_TABLET | Freq: Two times a day (BID) | ORAL | Status: DC

## 2015-02-15 NOTE — Patient Instructions (Signed)
HOLD ON ATORVASTATIN WHILE ON DIFLUCAN; THEN RESUME

## 2015-02-16 ENCOUNTER — Encounter: Payer: Self-pay | Admitting: Nurse Practitioner

## 2015-02-16 NOTE — Progress Notes (Signed)
Subjective:   Presents for complaints of sinus congestion sore throat cough and chest congestion for over 2 weeks. No fever. Complaints of her tongue being " cracked open ".  Brushed her tongue this morning with her toothbrush and it started to bleed. Has also had irritation and burning in the throat. Uses Flovent twice a day, does not rinse out her mouth. Facial area headache. Runny nose. Frequent mostly nonproductive cough. Mucus is gray green and yellow. Ear pressure. flareup of her asthma. Using her albuterol about 6 times per day, has not used it today.  Objective:   BP 136/92 mmHg  Pulse 76  Temp(Src) 98.7 F (37.1 C) (Oral)  Wt 188 lb 3.2 oz (85.367 kg)  LMP 07/10/2013  NAD. Alert, oriented. TMs significant clear effusion, no erythema. A white raised film covers the tongue with areas of yellow. Pharynx moderately erythematous with green PND noted.. Neck supple with mild soft anterior adenopathy. Lungs initially diminished breath sounds in general with loud expiratory rhonchi and  Faint wheezing noted mainly posterior. No tachypnea but some shortness of breath with talking. Given albuterol 2.5 mg nebulizer treatment. Wheezing and rhonchi completely resolved with improved airflow. Heart regular rate rhythm.  Assessment:  Problem List Items Addressed This Visit      Respiratory   Asthma   Relevant Medications   albuterol (PROVENTIL) (2.5 MG/3ML) 0.083% nebulizer solution 2.5 mg (Completed)   predniSONE (DELTASONE) 20 MG tablet    Other Visit Diagnoses    Rhinosinusitis    -  Primary    Relevant Medications    albuterol (PROVENTIL) (2.5 MG/3ML) 0.083% nebulizer solution 2.5 mg (Completed)    amoxicillin-clavulanate (AUGMENTIN) 875-125 MG tablet    predniSONE (DELTASONE) 20 MG tablet    fluconazole (DIFLUCAN) 100 MG tablet    Acute bronchitis, unspecified organism        Oral candidiasis        Relevant Medications    fluconazole (DIFLUCAN) 100 MG tablet      Plan:  Meds ordered  this encounter  Medications  . albuterol (PROVENTIL) (2.5 MG/3ML) 0.083% nebulizer solution 2.5 mg    Sig:   . amoxicillin-clavulanate (AUGMENTIN) 875-125 MG tablet    Sig: Take 1 tablet by mouth 2 (two) times daily.    Dispense:  20 tablet    Refill:  0    Order Specific Question:  Supervising Provider    Answer:  Mikey Kirschner [[id number]]  . predniSONE (DELTASONE) 20 MG tablet    Sig: 3 po qd x 3 d then 2 po qd x 3 d then 1 po qd x 3 d    Dispense:  18 tablet    Refill:  0    Order Specific Question:  Supervising Provider    Answer:  Mikey Kirschner [[id number]]  . fluconazole (DIFLUCAN) 100 MG tablet    Sig: Take 2 po the first day then one po qd x 14 d    Dispense:  16 tablet    Refill:  0    Order Specific Question:  Supervising Provider    Answer:  Mikey Kirschner [[id number]]    warning signs reviewed. Call back in 48-72 hours if no improvement, go to ED sooner if worse. Also patient may need to call back for refill on Diflucan 1 she has finished all of her medications. Continue albuterol inhaler or nebulizer as directed. Continue Flovent as directed, advised patient to rinse out her mouth after use. Hold on atorvastatin while patient  is on Diflucan , she verbalizes understanding.

## 2015-03-11 ENCOUNTER — Other Ambulatory Visit: Payer: Self-pay | Admitting: Family Medicine

## 2015-03-15 DIAGNOSIS — F339 Major depressive disorder, recurrent, unspecified: Secondary | ICD-10-CM | POA: Diagnosis not present

## 2015-04-09 ENCOUNTER — Other Ambulatory Visit: Payer: Self-pay | Admitting: Family Medicine

## 2015-05-07 ENCOUNTER — Ambulatory Visit: Payer: Medicare Other | Admitting: Family Medicine

## 2015-05-11 ENCOUNTER — Encounter: Payer: Self-pay | Admitting: Family Medicine

## 2015-05-11 ENCOUNTER — Ambulatory Visit (INDEPENDENT_AMBULATORY_CARE_PROVIDER_SITE_OTHER): Payer: Medicare Other | Admitting: Family Medicine

## 2015-05-11 VITALS — Ht 63.0 in

## 2015-05-11 DIAGNOSIS — E119 Type 2 diabetes mellitus without complications: Secondary | ICD-10-CM | POA: Diagnosis not present

## 2015-05-11 DIAGNOSIS — Z79899 Other long term (current) drug therapy: Secondary | ICD-10-CM

## 2015-05-11 DIAGNOSIS — E785 Hyperlipidemia, unspecified: Secondary | ICD-10-CM

## 2015-05-11 LAB — POCT GLYCOSYLATED HEMOGLOBIN (HGB A1C): Hemoglobin A1C: 5.6

## 2015-05-11 MED ORDER — ALBUTEROL SULFATE HFA 108 (90 BASE) MCG/ACT IN AERS: 2.0000 | INHALATION_SPRAY | Freq: Four times a day (QID) | RESPIRATORY_TRACT | Status: DC | PRN

## 2015-05-11 MED ORDER — INSULIN GLARGINE 100 UNIT/ML SOLOSTAR PEN: 6.0000 [IU] | PEN_INJECTOR | Freq: Every evening | SUBCUTANEOUS | Status: DC

## 2015-05-11 MED ORDER — FLUTICASONE PROPIONATE HFA 220 MCG/ACT IN AERO: 2.0000 | INHALATION_SPRAY | Freq: Two times a day (BID) | RESPIRATORY_TRACT | Status: DC

## 2015-05-11 NOTE — Progress Notes (Signed)
   Subjective:    Patient ID: [facility], female    DOB: June 26, 1960, 55 y.o.   MRN: [id number]  Diabetes She presents for her follow-up diabetic visit. She has type 2 diabetes mellitus. Risk factors for coronary artery disease include dyslipidemia, diabetes mellitus and hypertension. Current diabetic treatment includes insulin injections. She is compliant with treatment all of the time. Her weight is stable. She is following a diabetic diet.      Review of Systems Does have some low sugar spells she described these. Some shaking and trembling it gets better with eating she denies excessive thirst urination she does state she eats more frequently than what she feels she should.    Objective:   Physical Exam   Lungs clear heart regular blood pressure good abdomen soft extremities no edema     Assessment & Plan:  Diabetes A1c is too low significant amount time spent discussing things stop 7030 insulin use long-acting insulin in the evening time start with 6 units adjust accordingly based on readings she will send [place] readings over the next couple weeks  Healthy diet regular physical activity recommended Hyperlipidemia check lipid profile along with liver profile and [id number] metabolic 7 before next visit in 4 months

## 2015-06-01 ENCOUNTER — Other Ambulatory Visit: Payer: Self-pay | Admitting: Family Medicine

## 2015-06-07 DIAGNOSIS — F339 Major depressive disorder, recurrent, unspecified: Secondary | ICD-10-CM | POA: Diagnosis not present

## 2015-07-06 ENCOUNTER — Other Ambulatory Visit: Payer: Self-pay | Admitting: Obstetrics & Gynecology

## 2015-07-08 ENCOUNTER — Other Ambulatory Visit: Payer: Self-pay | Admitting: Family Medicine

## 2015-07-08 ENCOUNTER — Other Ambulatory Visit: Payer: Self-pay | Admitting: Obstetrics & Gynecology

## 2015-09-10 ENCOUNTER — Encounter: Payer: Self-pay | Admitting: Family Medicine

## 2015-09-10 ENCOUNTER — Ambulatory Visit (INDEPENDENT_AMBULATORY_CARE_PROVIDER_SITE_OTHER): Payer: Medicare Other | Admitting: Family Medicine

## 2015-09-10 VITALS — BP 120/84 | Ht 63.0 in | Wt 187.0 lb

## 2015-09-10 DIAGNOSIS — Z79899 Other long term (current) drug therapy: Secondary | ICD-10-CM

## 2015-09-10 DIAGNOSIS — E785 Hyperlipidemia, unspecified: Secondary | ICD-10-CM | POA: Diagnosis not present

## 2015-09-10 DIAGNOSIS — M25561 Pain in right knee: Secondary | ICD-10-CM

## 2015-09-10 DIAGNOSIS — D509 Iron deficiency anemia, unspecified: Secondary | ICD-10-CM

## 2015-09-10 DIAGNOSIS — I10 Essential (primary) hypertension: Secondary | ICD-10-CM

## 2015-09-10 DIAGNOSIS — E119 Type 2 diabetes mellitus without complications: Secondary | ICD-10-CM

## 2015-09-10 LAB — POCT GLYCOSYLATED HEMOGLOBIN (HGB A1C): Hemoglobin A1C: 5.6

## 2015-09-10 MED ORDER — ATORVASTATIN CALCIUM 10 MG PO TABS: ORAL_TABLET | ORAL | 1 refills | Status: DC

## 2015-09-10 MED ORDER — METOPROLOL TARTRATE 25 MG PO TABS: 25.0000 mg | ORAL_TABLET | Freq: Two times a day (BID) | ORAL | 1 refills | Status: DC

## 2015-09-10 MED ORDER — INSULIN GLARGINE 100 UNIT/ML SOLOSTAR PEN: 6.0000 [IU] | PEN_INJECTOR | Freq: Every evening | SUBCUTANEOUS | 5 refills | Status: DC

## 2015-09-10 MED ORDER — LOSARTAN POTASSIUM 50 MG PO TABS: ORAL_TABLET | ORAL | 1 refills | Status: DC

## 2015-09-10 MED ORDER — METFORMIN HCL 500 MG PO TABS: ORAL_TABLET | ORAL | 1 refills | Status: DC

## 2015-09-10 NOTE — Progress Notes (Signed)
   Subjective:    Patient ID: Carrie Cook, female    DOB: 1960-08-19, 55 y.o.   MRN: [id number]  Diabetes  She presents for her follow-up diabetic visit. She has type 2 diabetes mellitus. Pertinent negatives for hypoglycemia include no confusion. Pertinent negatives for diabetes include no chest pain, no fatigue, no polydipsia, no polyphagia and no weakness. Current diabetic treatments: metformin, lantus. She is compliant with treatment all of the time. Diabetic current diet: could do better with diet. She rarely participates in exercise. (120 - 150) She does not see a podiatrist.Eye exam is not current.   Pt states no concerns today.  She relates right knee pain discomfort hurts to walk she finds himself limiting her activity because of this. She states it locks at times and gets swollen at times She states she takes her blood pressure medicine watch his diet to some degree She does try to be careful with cholesterol and fats in her diet She states her moods are doing good overall She does relate some fatigue and wonders if her iron deficient anemia is coming back  Review of Systems  Constitutional: Negative for activity change, appetite change and fatigue.  HENT: Negative for congestion.   Respiratory: Negative for cough.   Cardiovascular: Negative for chest pain.  Gastrointestinal: Negative for abdominal pain.  Endocrine: Negative for polydipsia and polyphagia.  Neurological: Negative for weakness.  Psychiatric/Behavioral: Negative for confusion.   25 minutes was spent with the patient. Greater than half the time was spent in discussion and answering questions and counseling regarding the issues that the patient came in for today.     Objective:   Physical Exam  Constitutional: She appears well-nourished. No distress.  Cardiovascular: Normal rate, regular rhythm and normal heart sounds.   No murmur heard. Pulmonary/Chest: Effort normal and breath sounds normal. No respiratory  distress.  Musculoskeletal: She exhibits no edema.  Lymphadenopathy:    She has no cervical adenopathy.  Neurological: She is alert. She exhibits normal muscle tone.  Psychiatric: Her behavior is normal.  Vitals reviewed.         Assessment & Plan:  Diabetes good control continue current measures avoid low sugar spells notify us if any patient is post get diabetic eye exam coming up her diabetic foot exam normal  Right knee pain osteoarthritis probable referral to Dr. Aline Brochure for further evaluation  Hypertension good control continue current measures watch salt in diet stay active try to lose weight  Hyperlipidemia previous labs reviewed with patient importance of getting LDL less than 70 discussed. Lab work ordered  Iron deficient anemia with some fatigue check CBC may need to do other testing as well.

## 2015-09-22 ENCOUNTER — Ambulatory Visit (INDEPENDENT_AMBULATORY_CARE_PROVIDER_SITE_OTHER): Payer: Medicare Other

## 2015-09-22 ENCOUNTER — Encounter: Payer: Self-pay | Admitting: Orthopaedic Surgery

## 2015-09-22 ENCOUNTER — Ambulatory Visit (INDEPENDENT_AMBULATORY_CARE_PROVIDER_SITE_OTHER): Payer: Medicare Other | Admitting: Orthopaedic Surgery

## 2015-09-22 VITALS — BP 133/86 | HR 72 | Ht 62.75 in | Wt 187.0 lb

## 2015-09-22 DIAGNOSIS — M25561 Pain in right knee: Secondary | ICD-10-CM | POA: Diagnosis not present

## 2015-09-22 NOTE — Progress Notes (Signed)
Subjective: My right knee hurts    Patient ID: Carrie Cook, female    DOB: 06/16/1960, 55 y.o.   MRN: [id number]  HPI  She has several year history of right knee pain getting gradually worse over the last half year. She has swelling, popping and now giving way of the right knee.  It has also locked on her a few times.  She has difficulty on inclines.  She has pain at night.  The knee feels like it "shifts" on itself at times.  She has tried ice, rest, rubs, Advil, heat with little help recently.  She is very concerned as she falls often.  Review of Systems  HENT: Negative for congestion.   Respiratory: Positive for shortness of breath. Negative for cough.   Cardiovascular: Negative for chest pain and leg swelling.  Endocrine: Positive for cold intolerance.  Musculoskeletal: Positive for arthralgias, gait problem and joint swelling.  Allergic/Immunologic: Positive for environmental allergies.  Psychiatric/Behavioral: The patient is nervous/anxious.    Past Medical History:  Diagnosis Date  . Anemia    PT HAS HAD COLONOSCOPY AND ENDOSCOPY WORK UP - NO PROBLEMS FOUND AS SOURCE OF ANEMIA -- PT HAD IRON INFUSION AT [facility] 11/13/12-AFTER SEEING HEMATOLOGIST DR. [place] AND HE GAVE HEMATOLOGIC CLEARANCE FOR GASTRIC BYPASS SURGERY.  Marland Kitchen Anxiety   . Asthma    daily and prn inhalers  . Degenerative joint disease    right knee, spine - STATES INTERMITTENT  NUMBNESS DOWN RT LEG WITH PROLONGED STANDING OR WALKING- THINKS RELATED TO HER SPINE PROBLEMS  . Dental crowns present   . Depression   . Family history of anesthesia complication    pt's mother and sister have hx. of post-op N/V  . Fibroids    UTERINE  . Gastroesophageal reflux disease   . H/O hiatal hernia   . History of endometriosis   . Hyperlipidemia    no current med.  . Hypertension    under control with med., has been on med. x 2 yr.  . Insulin dependent diabetes mellitus ([place])   . Lock jaw    jaw locks open if opens  mouth wide  . Migraines   . Neuropathy ([place])    FEET  . Obesity   . Palpitations   . PONV (postoperative nausea and vomiting)   . Shortness of breath    with daily activities  . Sleep apnea    uses CPAP nightly SETTING IS 12    Past Surgical History:  Procedure Laterality Date  . BREATH TEK H PYLORI N/A 08/13/2012   Procedure: BREATH TEK H PYLORI;  Surgeon: Edward Jolly, MD;  Location: [facility];  Service: General;  Laterality: N/A;  . CARPAL TUNNEL RELEASE  01/03/2012   Procedure: CARPAL TUNNEL RELEASE;  Surgeon: Wynonia Sours, MD;  Location: [facility];  Service: Orthopedics;  Laterality: Right;  . carpal tunnel release right hand Right 12/13  . COLONOSCOPY  05/2009   [id number] hemorrhoids otherwise normal colon, rectum and terminal ileum  . COLONOSCOPY WITH ESOPHAGOGASTRODUODENOSCOPY (EGD) N/A 10/28/2012   Procedure: COLONOSCOPY WITH ESOPHAGOGASTRODUODENOSCOPY (EGD);  Surgeon: Daneil Dolin, MD;  [facility];  Service: Endoscopy;  Laterality: N/A;  7:30  . DILITATION & CURRETTAGE/HYSTROSCOPY WITH NOVASURE ABLATION N/A 07/22/2014   Procedure: DILATATION & CURETTAGE/HYSTEROSCOPY WITH NOVASURE ABLATION; uterine length 6.0 cm; uterine width 3.8 cm; total ablation time 1 minute 15 seconds;  Surgeon: Florian Buff, MD;  Location: [facility];  Service:  Gynecology;  Laterality: N/A;  . ESOPHAGOGASTRODUODENOSCOPY  5/013/2011   [id number] esophagus/multiple polyps removed s/p (hyperplastic). Gastritis without H.pylori.  Marland Kitchen GASTRIC ROUX-EN-Y N/A 11/19/2012   Procedure: LAPAROSCOPIC ROUX-EN-Y GASTRIC;  Surgeon: Edward Jolly, MD;  Location: [facility];  Service: General;  Laterality: N/A;  . GIVENS CAPSULE STUDY N/A 10/28/2012   Procedure: GIVENS CAPSULE STUDY;  Surgeon: Daneil Dolin, MD;  [facility];  Service: Endoscopy;  Laterality: N/A;  . LAPAROSCOPY     due to endometriosis  . PELVIC LAPAROSCOPY  11/04/1999   with fulguration of  endometriosis  . TRIGGER FINGER RELEASE Right 09/24/2012   Procedure: RELEASE A-1 PULLEY OF RIGHT THUMB;  Surgeon: Wynonia Sours, MD;  Location: [facility];  Service: Orthopedics;  Laterality: Right;  . TUBAL LIGATION  1993    Current Outpatient Prescriptions on File Prior to Visit  Medication Sig Dispense Refill  . albuterol (PROVENTIL HFA;VENTOLIN HFA) 108 (90 Base) MCG/ACT inhaler Inhale 2 puffs into the lungs every 6 (six) hours as needed for wheezing. 1 Inhaler 6  . atorvastatin (LIPITOR) 10 MG tablet TAKE 1 TABLET(10 MG) BY MOUTH DAILY 90 tablet 1  . estradiol (ESTRACE) 2 MG tablet TAKE 1 TABLET BY MOUTH DAILY 90 tablet 3  . fluticasone (FLOVENT HFA) 220 MCG/ACT inhaler Inhale 2 puffs into the lungs 2 (two) times daily. 1 Inhaler 12  . Insulin Glargine (LANTUS SOLOSTAR) 100 UNIT/ML Solostar Pen Inject 6 Units into the skin every evening. May titrate up to 14 units 5 pen 5  . lamoTRIgine (LAMICTAL) 100 MG tablet TK ONE HALF T BID  3  . loratadine (CLARITIN) 10 MG tablet Take 10 mg by mouth daily.    Marland Kitchen losartan (COZAAR) 50 MG tablet TAKE 1 TABLET(50 MG) BY MOUTH DAILY 90 tablet 1  . metFORMIN (GLUCOPHAGE) 500 MG tablet TAKE 1 TABLET BY MOUTH TWICE DAILY WITH A MEAL 180 tablet 1  . metoprolol tartrate (LOPRESSOR) 25 MG tablet Take 1 tablet (25 mg total) by mouth 2 (two) times daily. 180 tablet 1  . Multiple Vitamin (MULTIVITAMIN WITH MINERALS) TABS tablet Take 2 tablets by mouth daily.     . progesterone (PROMETRIUM) 200 MG capsule TAKE 1 CAPSULE BY MOUTH EVERY EVENING 30 capsule 11  . venlafaxine (EFFEXOR) 75 MG tablet Take 75 mg by mouth 2 (two) times daily with a meal.   1  . vitamin B-12 (CYANOCOBALAMIN) 500 MCG tablet Take 1,000 mcg by mouth daily.      No current facility-administered medications on file prior to visit.     Social History   Social History  . Marital status: Married    Spouse name: N/A  . Number of children: 1  . Years of education: N/A    Occupational History  . Application for disability pending    Social History Main Topics  . Smoking status: Never Smoker  . Smokeless tobacco: Never Used  . Alcohol use No  . Drug use: No  . Sexual activity: Yes   Other Topics Concern  . Not on file   Social History Narrative  . No narrative on file    Family History  Problem Relation Age of Onset  . Hypertension Father   . Hyperlipidemia Father   . COPD Mother   . Heart disease Mother   . Cancer Mother     Cervix  . Anesthesia problems Mother     post-op N/V  . Thyroid disease Sister   . Anesthesia  problems Sister     post-op N/V  . Thyroid disease Paternal Uncle   . Diabetes Maternal Grandfather   . Diabetes Other     BP 133/86   Pulse 72   Ht 5' 2.75" (1.594 m)   Wt 187 lb (84.8 kg)   LMP 07/10/2013   BMI 33.39 kg/m      Objective:   Physical Exam  Constitutional: She is oriented to person, place, and time. She appears well-developed and well-nourished.  HENT:  Head: Normocephalic and atraumatic.  Eyes: Conjunctivae and EOM are normal. Pupils are equal, round, and reactive to light.  Neck: Normal range of motion. Neck supple.  Cardiovascular: Normal rate, regular rhythm and intact distal pulses.   Pulmonary/Chest: Effort normal.  Abdominal: Soft.  Musculoskeletal: She exhibits tenderness (Pain right knee, ROM 0 to 105, positive medial McMurray, 1/2+ drawer sign, crepitus, NV intact.  Left knee negative.).  Neurological: She is alert and oriented to person, place, and time. She displays normal reflexes. No cranial nerve deficit. She exhibits normal muscle tone. Coordination normal.  Skin: Skin is warm and dry.  Psychiatric: She has a normal mood and affect. Her behavior is normal. Judgment and thought content normal.  Vitals reviewed.   X-rays were done of the right knee, reported separately.      Assessment & Plan:   Encounter Diagnosis  Name Primary?  . Right knee pain Yes   PROCEDURE  NOTE:  The patient requests injections of the right knee , verbal consent was obtained.  The right knee was prepped appropriately after time out was performed.   Sterile technique was observed and injection of 1 cc of Depo-Medrol 40 mg with several cc's of plain xylocaine. Anesthesia was provided by ethyl chloride and a 20-gauge needle was used to inject the knee area. The injection was tolerated well.  A band aid dressing was applied.  The patient was advised to apply ice later today and tomorrow to the injection sight as needed.  I am concerned about a medial meniscus tear and possible ACL problem of the right knee.  She has giving way, locking, swelling with no improvement.  I have ordered a MRI.  Return to office after MRI.  Call if any problem  Precautions discussed.  Electronically Signed Sanjuana Kava, MD 8/30/20179:06 AM

## 2015-09-28 DIAGNOSIS — F339 Major depressive disorder, recurrent, unspecified: Secondary | ICD-10-CM | POA: Diagnosis not present

## 2015-09-30 ENCOUNTER — Ambulatory Visit (HOSPITAL_COMMUNITY)
Admission: RE | Admit: 2015-09-30 | Discharge: 2015-09-30 | Disposition: A | Discharge status: Home / Self Care | Payer: Medicare Other | Source: Ambulatory Visit | Attending: Orthopaedic Surgery | Admitting: Orthopaedic Surgery

## 2015-09-30 DIAGNOSIS — M25561 Pain in right knee: Secondary | ICD-10-CM | POA: Diagnosis not present

## 2015-09-30 DIAGNOSIS — M1711 Unilateral primary osteoarthritis, right knee: Secondary | ICD-10-CM | POA: Insufficient documentation

## 2015-09-30 DIAGNOSIS — X58XXXA Exposure to other specified factors, initial encounter: Secondary | ICD-10-CM | POA: Insufficient documentation

## 2015-09-30 DIAGNOSIS — S83241A Other tear of medial meniscus, current injury, right knee, initial encounter: Secondary | ICD-10-CM | POA: Diagnosis not present

## 2015-10-05 ENCOUNTER — Encounter: Payer: Self-pay | Admitting: Orthopaedic Surgery

## 2015-10-05 ENCOUNTER — Other Ambulatory Visit: Payer: Self-pay | Admitting: *Deleted

## 2015-10-05 ENCOUNTER — Ambulatory Visit (INDEPENDENT_AMBULATORY_CARE_PROVIDER_SITE_OTHER): Payer: Medicare Other | Admitting: Orthopaedic Surgery

## 2015-10-05 VITALS — BP 130/87 | HR 87 | Temp 96.8°F | Ht 63.0 in | Wt 187.0 lb

## 2015-10-05 DIAGNOSIS — M25561 Pain in right knee: Secondary | ICD-10-CM | POA: Diagnosis not present

## 2015-10-05 NOTE — Progress Notes (Signed)
Patient [id number] Carrie Cook, female DOB:06/16/1960, 55 y.o. [id number]  Chief Complaint  Patient presents with  . Follow-up    mri review right knee    HPI  Carrie Cook is a 55 y.o. female who has right knee pain and giving way of the right knee.  She had a MRI done and it shows: IMPRESSION: Dominant finding is osteoarthritis about the knee appearing worst medially where it is advanced.  Complex tearing in the body of the medial meniscus. The body is diminutive and extruded peripherally.  I have gone over the findings and showed her a model of the knee.  I recommend consideration for arthroscopy.  I will have her see Dr. Aline Brochure for this. She is agreeable. HPI  Body mass index is 33.13 kg/m.  ROS  Review of Systems  HENT: Negative for congestion.   Respiratory: Positive for shortness of breath. Negative for cough.   Cardiovascular: Negative for chest pain and leg swelling.  Endocrine: Positive for cold intolerance.  Musculoskeletal: Positive for arthralgias, gait problem and joint swelling.  Allergic/Immunologic: Positive for environmental allergies.  Psychiatric/Behavioral: The patient is nervous/anxious.     Past Medical History:  Diagnosis Date  . Anemia    PT HAS HAD COLONOSCOPY AND ENDOSCOPY WORK UP - NO PROBLEMS FOUND AS SOURCE OF ANEMIA -- PT HAD IRON INFUSION AT [facility] 11/13/12-AFTER SEEING HEMATOLOGIST DR. [place] AND HE GAVE HEMATOLOGIC CLEARANCE FOR GASTRIC BYPASS SURGERY.  Marland Kitchen Anxiety   . Asthma    daily and prn inhalers  . Degenerative joint disease    right knee, spine - STATES INTERMITTENT  NUMBNESS DOWN RT LEG WITH PROLONGED STANDING OR WALKING- THINKS RELATED TO HER SPINE PROBLEMS  . Dental crowns present   . Depression   . Family history of anesthesia complication    pt's mother and sister have hx. of post-op N/V  . Fibroids    UTERINE  . Gastroesophageal reflux disease   . H/O hiatal hernia   . History of endometriosis   .  Hyperlipidemia    no current med.  . Hypertension    under control with med., has been on med. x 2 yr.  . Insulin dependent diabetes mellitus ([place])   . Lock jaw    jaw locks open if opens mouth wide  . Migraines   . Neuropathy ([place])    FEET  . Obesity   . Palpitations   . PONV (postoperative nausea and vomiting)   . Shortness of breath    with daily activities  . Sleep apnea    uses CPAP nightly SETTING IS 12    Past Surgical History:  Procedure Laterality Date  . BREATH TEK H PYLORI N/A 08/13/2012   Procedure: BREATH TEK H PYLORI;  Surgeon: Edward Jolly, MD;  Location: [facility];  Service: General;  Laterality: N/A;  . CARPAL TUNNEL RELEASE  01/03/2012   Procedure: CARPAL TUNNEL RELEASE;  Surgeon: Wynonia Sours, MD;  Location: [facility];  Service: Orthopedics;  Laterality: Right;  . carpal tunnel release right hand Right 12/13  . COLONOSCOPY  05/2009   [id number] hemorrhoids otherwise normal colon, rectum and terminal ileum  . COLONOSCOPY WITH ESOPHAGOGASTRODUODENOSCOPY (EGD) N/A 10/28/2012   Procedure: COLONOSCOPY WITH ESOPHAGOGASTRODUODENOSCOPY (EGD);  Surgeon: Daneil Dolin, MD;  [facility];  Service: Endoscopy;  Laterality: N/A;  7:30  . DILITATION & CURRETTAGE/HYSTROSCOPY WITH NOVASURE ABLATION N/A 07/22/2014   Procedure: DILATATION & CURETTAGE/HYSTEROSCOPY WITH NOVASURE ABLATION; uterine length 6.0  cm; uterine width 3.8 cm; total ablation time 1 minute 15 seconds;  Surgeon: Florian Buff, MD;  Location: [facility];  Service: Gynecology;  Laterality: N/A;  . ESOPHAGOGASTRODUODENOSCOPY  5/013/2011   [id number] esophagus/multiple polyps removed s/p (hyperplastic). Gastritis without H.pylori.  Marland Kitchen GASTRIC ROUX-EN-Y N/A 11/19/2012   Procedure: LAPAROSCOPIC ROUX-EN-Y GASTRIC;  Surgeon: Edward Jolly, MD;  Location: [facility];  Service: General;  Laterality: N/A;  . GIVENS CAPSULE STUDY N/A 10/28/2012   Procedure: GIVENS CAPSULE STUDY;  Surgeon:  Daneil Dolin, MD;  [facility];  Service: Endoscopy;  Laterality: N/A;  . LAPAROSCOPY     due to endometriosis  . PELVIC LAPAROSCOPY  11/04/1999   with fulguration of endometriosis  . TRIGGER FINGER RELEASE Right 09/24/2012   Procedure: RELEASE A-1 PULLEY OF RIGHT THUMB;  Surgeon: Wynonia Sours, MD;  Location: [facility];  Service: Orthopedics;  Laterality: Right;  . TUBAL LIGATION  1993    Family History  Problem Relation Age of Onset  . Hypertension Father   . Hyperlipidemia Father   . COPD Mother   . Heart disease Mother   . Cancer Mother     Cervix  . Anesthesia problems Mother     post-op N/V  . Thyroid disease Sister   . Anesthesia problems Sister     post-op N/V  . Thyroid disease Paternal Uncle   . Diabetes Maternal Grandfather   . Diabetes Other     Social History Social History  Substance Use Topics  . Smoking status: Never Smoker  . Smokeless tobacco: Never Used  . Alcohol use No    Allergies  Allergen Reactions  . Qvar [Beclomethasone] Shortness Of Breath    Patient states that this medication causes her to feel short of breath  . Codeine Nausea And Vomiting  . Prozac [Fluoxetine Hcl] Hives  . Dyazide [Hydrochlorothiazide W-Triamterene] Cough  . Erythromycin Nausea Only  . Lisinopril Cough  . Pravastatin Other (See Comments)    BODY ACHES, FLU-LIKE SX.  . Sulfonamide Derivatives Rash    Current Outpatient Prescriptions  Medication Sig Dispense Refill  . albuterol (PROVENTIL HFA;VENTOLIN HFA) 108 (90 Base) MCG/ACT inhaler Inhale 2 puffs into the lungs every 6 (six) hours as needed for wheezing. 1 Inhaler 6  . atorvastatin (LIPITOR) 10 MG tablet TAKE 1 TABLET(10 MG) BY MOUTH DAILY 90 tablet 1  . estradiol (ESTRACE) 2 MG tablet TAKE 1 TABLET BY MOUTH DAILY 90 tablet 3  . fluticasone (FLOVENT HFA) 220 MCG/ACT inhaler Inhale 2 puffs into the lungs 2 (two) times daily. 1 Inhaler 12  . Insulin Glargine (LANTUS SOLOSTAR) 100  UNIT/ML Solostar Pen Inject 6 Units into the skin every evening. May titrate up to 14 units 5 pen 5  . lamoTRIgine (LAMICTAL) 100 MG tablet TK ONE HALF T BID  3  . loratadine (CLARITIN) 10 MG tablet Take 10 mg by mouth daily.    Marland Kitchen losartan (COZAAR) 50 MG tablet TAKE 1 TABLET(50 MG) BY MOUTH DAILY 90 tablet 1  . metFORMIN (GLUCOPHAGE) 500 MG tablet TAKE 1 TABLET BY MOUTH TWICE DAILY WITH A MEAL 180 tablet 1  . metoprolol tartrate (LOPRESSOR) 25 MG tablet Take 1 tablet (25 mg total) by mouth 2 (two) times daily. 180 tablet 1  . Multiple Vitamin (MULTIVITAMIN WITH MINERALS) TABS tablet Take 2 tablets by mouth daily.     . progesterone (PROMETRIUM) 200 MG capsule TAKE 1 CAPSULE BY MOUTH EVERY EVENING 30 capsule 11  .  venlafaxine (EFFEXOR) 75 MG tablet Take 75 mg by mouth 2 (two) times daily with a meal.   1  . vitamin B-12 (CYANOCOBALAMIN) 500 MCG tablet Take 1,000 mcg by mouth daily.      No current facility-administered medications for this visit.      Physical Exam  Blood pressure 130/87, pulse 87, temperature (!) 96.8 F (36 C), height 5\' 3"  (1.6 m), weight 187 lb (84.8 kg), last menstrual period 07/10/2013.  Constitutional: overall normal hygiene, normal nutrition, well developed, normal grooming, normal body habitus. Assistive device:none  Musculoskeletal: gait and station Limp right, muscle tone and strength are normal, no tremors or atrophy is present.  .  Neurological: coordination overall normal.  Deep tendon reflex/nerve stretch intact.  Sensation normal.  Cranial nerves II-XII intact.   Skin:   Normal overall no scars, lesions, ulcers or rashes. No psoriasis.  Psychiatric: Alert and oriented x 3.  Recent memory intact, remote memory unclear.  Normal mood and affect. Well groomed.  Good eye contact.  Cardiovascular: overall no swelling, no varicosities, no edema bilaterally, normal temperatures of the legs and arms, no clubbing, cyanosis and good capillary refill.  Lymphatic:  palpation is normal.  The right lower extremity is examined:  Inspection:  Thigh:  Non-tender and no defects  Knee has swelling 1+ effusion.                        Joint tenderness is present                        Patient is tender over the medial joint line  Lower Leg:  Has normal appearance and no tenderness or defects  Ankle:  Non-tender and no defects  Foot:  Non-tender and no defects Range of Motion:  Knee:  Range of motion is: 0-105                        Crepitus is  present  Ankle:  Range of motion is normal. Strength and Tone:  The right lower extremity has normal strength and tone. Stability:  Knee:  The knee has positive medial McMurray.  Ankle:  The ankle is stable.    The patient has been educated about the nature of the problem(s) and counseled on treatment options.  The patient appeared to understand what I have discussed and is in agreement with it.  Encounter Diagnosis  Name Primary?  . Right knee pain Yes    PLAN Call if any problems.  Precautions discussed.  Continue current medications.   Return to clinic See Dr. Aline Brochure for consideration of knee surgery right.   Electronically Signed Sanjuana Kava, MD 9/12/20172:07 PM

## 2015-10-05 NOTE — Patient Instructions (Signed)
Precautions discussed.  

## 2015-10-25 NOTE — Patient Instructions (Addendum)
Carrie Cook  10/25/2015     @PREFPERIOPPHARMACY @   Your procedure is scheduled on  10/28/2015   Report to [facility] at  1100  A.M.  Call this number if you have problems the morning of surgery:  [phone]   Remember:  Do not eat food or drink liquids after midnight.  Take these medicines the morning of surgery with A SIP OF WATER  Claritin, cozaar, metoprolol, effexor. DO NOT take any medicine for diabetes the morning of surgery. Only take 1/2 of your usual insulin dosage the night before your surgery. Take your inhalers before you come.   Do not wear jewelry, make-up or nail polish.  Do not wear lotions, powders, or perfumes, or deoderant.  Do not shave 48 hours prior to surgery.  Men may shave face and neck.  Do not bring valuables to the hospital.  [facility] is not responsible for any belongings or valuables.  Contacts, dentures or bridgework may not be worn into surgery.  Leave your suitcase in the car.  After surgery it may be brought to your room.  For patients admitted to the hospital, discharge time will be determined by your treatment team.  Patients discharged the day of surgery will not be allowed to drive home.   Name and phone number of your driver:   family Special instructions:  none  Please read over the following fact sheets that you were given. Anesthesia Post-op Instructions and Care and Recovery After Surgery       Meniscus Injury, Arthroscopy Arthroscopy is a surgical procedure that involves the use of a small scope that has a camera and surgical instruments on the end (arthroscope). An arthroscope can be used to repair your meniscus injury.  LET [facility] CARE PROVIDER KNOW ABOUT:  Any allergies you have.  All medicines you are taking, including vitamins, herbs, eyedrops, creams, and over-the-counter medicines.  Any recent colds or infections you have had or currently have.  Previous problems you or members of your family  have had with the use of anesthetics.  Any blood disorders or blood clotting problems you have.  Previous surgeries you have had.  Medical conditions you have. RISKS AND COMPLICATIONS Generally, this is a safe procedure. However, as with any procedure, problems can occur. Possible problems include:  Damage to nerves or blood vessels.  Excess bleeding.  Blood clots.  Infection. BEFORE THE PROCEDURE  Do not eat or drink for 6-8 hours before the procedure.  Take medicines as directed by your surgeon. Ask your surgeon about changing or stopping your regular medicines.  You may have lab tests the morning of surgery. PROCEDURE  You will be given one of the following:   A medicine that numbs the area (local anesthesia).  A medicine that makes you go to sleep (general anesthesia).  A medicine injected into your spine that numbs your body below the waist (spinal anesthesia). Most often, several small cuts (incisions) are made in the knee. The arthroscope and instruments go into the incisions to repair the damage. The torn portion of the meniscus is removed.  During this time, your surgeon may find a partial or complete tear in a cruciate ligament, such as the anterior cruciate ligament (ACL). A completely torn cruciate ligament is reconstructed by taking tissue from another part of the body (grafting) and placing it into the injured area. This requires several larger incisions to complete the repair. Sometimes, open surgery is  needed for collateral ligament injuries. If a collateral ligament is found to be injured, your surgeon may staple or suture the tear through a slightly larger incision on the side of the knee. AFTER THE PROCEDURE You will be taken to the recovery area where your progress will be monitored. When you are awake, stable, and taking fluids without complications, you will be allowed to go home. This is usually the same day. However, more extensive repairs of a ligament may  require an overnight stay.  The recovery time after repairing your meniscus or ligament depends on the amount of damage to these structures. It also depends on whether or not reconstructive knee surgery was needed.   A torn or stretched ligament (ligament sprain) may take 6-8 weeks to heal. It takes about the same amount of time if your surgeon removed a torn meniscus.  A repaired meniscus may require 6-12 weeks of recovery time.  A torn ligament needing reconstructive surgery may take 6-12 months to heal fully.   This information is not intended to replace advice given to you by your health care provider. Make sure you discuss any questions you have with your health care provider.   Document Released: 01/07/2000 Document Revised: 01/14/2013 Document Reviewed: 06/07/2012 Elsevier Interactive Patient Education 2016 [employer].  Arthroscopy, With Meniscus Injury, Care After Refer to this sheet in the next few weeks. These instructions provide you with general information on caring for yourself after your procedure. Your health care provider may also give you specific instructions. Your treatment has been planned according to the current medical practices, but problems sometimes occur. Call your health care provider if you have any problems or questions after your procedure. WHAT TO EXPECT AFTER THE PROCEDURE After your procedure, it is typical to have the following:  Pain and swelling in your knee.  Constipation.  Difficulty walking. HOME CARE INSTRUCTIONS   Use crutches and do knee exercises as directed by your health care provider.  Apply ice to the injured area:  Put ice in a plastic bag.  Place a towel between your skin and the bag.  Leave the ice on for 15-20 minutes, 3-4 times a day while awake. Do this for the first 2 days.  Rest and raise (elevate) your knee.  Change bandages (dressings) as directed by your health care provider.  Keep the wound dry and clean. The  wound may be washed gently with soap and water. Gently blot or dab the wound dry. It is okay to take showers 24-48 hours after surgery. Do not take baths, use swimming pools, or use hot tubs for 14 days, or as directed by your health care provider.  Only take over-the-counter or prescription medicines for pain, discomfort, or fever as directed by your health care provider.  Continue your normal diet as directed by your health care provider.  Do not lift anything more than 10 pounds or play contact sports for 3 weeks, or as directed by your health care provider.  If a brace was applied, use as directed by your health care provider.  Your health care provider will help with instructions for rehabilitation of your knee. SEEK MEDICAL CARE IF:   You have increased bleeding (more than a small spot) from the wound.  You have redness, swelling, or increasing pain in the wound.  Yellowish-white fluid (pus) is coming from your wound. SEEK IMMEDIATE MEDICAL CARE IF:   You develop a rash.  You have a fever or persistent symptoms for more than  2-3 days.  You have difficulty breathing.  You have increasing pain with movement of the knee. MAKE SURE YOU:   Understand these instructions.  Will watch your condition.  Will get help right away if you are not doing well or get worse.   This information is not intended to replace advice given to you by your health care provider. Make sure you discuss any questions you have with your health care provider.   Document Released: 07/29/2004 Document Revised: 09/11/2012 Document Reviewed: 06/18/2012 Elsevier Interactive Patient Education 2016 [employer] PATIENT INSTRUCTIONS POST-ANESTHESIA  IMMEDIATELY FOLLOWING SURGERY:  Do not drive or operate machinery for the first twenty four hours after surgery.  Do not make any important decisions for twenty four hours after surgery or while taking narcotic pain medications or sedatives.  If you develop  intractable nausea and vomiting or a severe headache please notify your doctor immediately.  FOLLOW-UP:  Please make an appointment with your surgeon as instructed. You do not need to follow up with anesthesia unless specifically instructed to do so.  WOUND CARE INSTRUCTIONS (if applicable):  Keep a dry clean dressing on the anesthesia/puncture wound site if there is drainage.  Once the wound has quit draining you may leave it open to air.  Generally you should leave the bandage intact for twenty four hours unless there is drainage.  If the epidural site drains for more than 36-48 hours please call the anesthesia department.  QUESTIONS?:  Please feel free to call your physician or the hospital operator if you have any questions, and they will be happy to assist you.

## 2015-10-26 ENCOUNTER — Encounter (HOSPITAL_COMMUNITY): Payer: Self-pay

## 2015-10-26 ENCOUNTER — Encounter (HOSPITAL_COMMUNITY)
Admission: RE | Admit: 2015-10-26 | Discharge: 2015-10-26 | Disposition: A | Discharge status: Home / Self Care | Payer: Medicare Other | Source: Ambulatory Visit | Attending: Orthopedic Surgery | Admitting: Orthopedic Surgery

## 2015-10-26 DIAGNOSIS — E119 Type 2 diabetes mellitus without complications: Secondary | ICD-10-CM | POA: Diagnosis not present

## 2015-10-26 DIAGNOSIS — J45909 Unspecified asthma, uncomplicated: Secondary | ICD-10-CM | POA: Diagnosis not present

## 2015-10-26 DIAGNOSIS — G4730 Sleep apnea, unspecified: Secondary | ICD-10-CM | POA: Diagnosis not present

## 2015-10-26 DIAGNOSIS — I10 Essential (primary) hypertension: Secondary | ICD-10-CM | POA: Diagnosis not present

## 2015-10-26 DIAGNOSIS — Z6833 Body mass index (BMI) 33.0-33.9, adult: Secondary | ICD-10-CM | POA: Diagnosis not present

## 2015-10-26 DIAGNOSIS — K219 Gastro-esophageal reflux disease without esophagitis: Secondary | ICD-10-CM | POA: Diagnosis not present

## 2015-10-26 DIAGNOSIS — E669 Obesity, unspecified: Secondary | ICD-10-CM | POA: Diagnosis not present

## 2015-10-26 DIAGNOSIS — M23303 Other meniscus derangements, unspecified medial meniscus, right knee: Secondary | ICD-10-CM | POA: Diagnosis not present

## 2015-10-26 LAB — CBC WITH DIFFERENTIAL/PLATELET
Basophils Absolute: 0 10*3/uL (ref 0.0–0.1)
Basophils Relative: 0 %
Eosinophils Absolute: 0.2 10*3/uL (ref 0.0–0.7)
Eosinophils Relative: 2 %
HCT: 44.3 % (ref 36.0–46.0)
Hemoglobin: 15.7 g/dL — ABNORMAL HIGH (ref 12.0–15.0)
Lymphocytes Relative: 19 %
Lymphs Abs: 1.8 10*3/uL (ref 0.7–4.0)
MCH: 32.2 pg (ref 26.0–34.0)
MCHC: 35.4 g/dL (ref 30.0–36.0)
MCV: 90.8 fL (ref 78.0–100.0)
Monocytes Absolute: 0.6 10*3/uL (ref 0.1–1.0)
Monocytes Relative: 6 %
Neutro Abs: 6.9 10*3/uL (ref 1.7–7.7)
Neutrophils Relative %: 73 %
Platelets: 218 10*3/uL (ref 150–400)
RBC: 4.88 MIL/uL (ref 3.87–5.11)
RDW: 11.9 % (ref 11.5–15.5)
WBC: 9.5 10*3/uL (ref 4.0–10.5)

## 2015-10-26 LAB — BASIC METABOLIC PANEL
Anion gap: 6 (ref 5–15)
BUN: 10 mg/dL (ref 6–20)
CO2: 28 mmol/L (ref 22–32)
Calcium: 9 mg/dL (ref 8.9–10.3)
Chloride: 102 mmol/L (ref 101–111)
Creatinine, Ser: 0.71 mg/dL (ref 0.44–1.00)
GFR calc Af Amer: >60 mL/min (ref >60)
GFR calc non Af Amer: >60 mL/min (ref >60)
Glucose, Bld: 204 mg/dL — ABNORMAL HIGH (ref 65–99)
Potassium: 4.2 mmol/L (ref 3.5–5.1)
Sodium: 136 mmol/L (ref 135–145)

## 2015-10-26 NOTE — Pre-Procedure Instructions (Signed)
Patient given information to sign up for my chart at home. 

## 2015-10-27 NOTE — H&P (Signed)
Chief Complaint  Patient presents with  . Follow-up      mri review right knee      HPI   Carrie Cook is a 55 y.o. female who has right knee pain and giving way of the right knee.  She had a MRI done and it shows:  Dr. Brooke Bonito history and physical has been reviewed I agree with the findings I have nothing to add  I talked to the patient about the findings or symptoms and she agrees to proceed with arthroscopy of the knee  She has arthritis and understands that she will have continued pain  MRI IMPRESSION: Dominant finding is osteoarthritis about the knee appearing worst medially where it is advanced.   Complex tearing in the body of the medial meniscus. The body is diminutive and extruded peripherally.   I have gone over the findings and showed her a model of the knee.  I recommend consideration for arthroscopy.  I will have her see Dr. Aline Brochure for this. She is agreeable. HPI   Body mass index is 33.13 kg/m.   ROS   Review of Systems  HENT: Negative for congestion.   Respiratory: Positive for shortness of breath. Negative for cough.   Cardiovascular: Negative for chest pain and leg swelling.  Endocrine: Positive for cold intolerance.  Musculoskeletal: Positive for arthralgias, gait problem and joint swelling.  Allergic/Immunologic: Positive for environmental allergies.  Psychiatric/Behavioral: The patient is nervous/anxious.           Past Medical History:  Diagnosis Date  . Anemia      PT HAS HAD COLONOSCOPY AND ENDOSCOPY WORK UP - NO PROBLEMS FOUND AS SOURCE OF ANEMIA -- PT HAD IRON INFUSION AT [facility] 11/13/12-AFTER SEEING HEMATOLOGIST DR. Clinton AND HE GAVE HEMATOLOGIC CLEARANCE FOR GASTRIC BYPASS SURGERY.  Marland Kitchen Anxiety    . Asthma      daily and prn inhalers  . Degenerative joint disease      right knee, spine - STATES INTERMITTENT  NUMBNESS DOWN RT LEG WITH PROLONGED STANDING OR WALKING- THINKS RELATED TO HER SPINE PROBLEMS  . Dental crowns present     . Depression    . Family history of anesthesia complication      pt's mother and sister have hx. of post-op N/V  . Fibroids      UTERINE  . Gastroesophageal reflux disease    . H/O hiatal hernia    . History of endometriosis    . Hyperlipidemia      no current med.  . Hypertension      under control with med., has been on med. x 2 yr.  . Insulin dependent diabetes mellitus ([place])    . Lock jaw      jaw locks open if opens mouth wide  . Migraines    . Neuropathy ([place])      FEET  . Obesity    . Palpitations    . PONV (postoperative nausea and vomiting)    . Shortness of breath      with daily activities  . Sleep apnea      uses CPAP nightly SETTING IS 12           Past Surgical History:  Procedure Laterality Date  . BREATH TEK H PYLORI N/A 08/13/2012    Procedure: BREATH TEK H PYLORI;  Surgeon: Edward Jolly, MD;  Location: [facility];  Service: General;  Laterality: N/A;  . CARPAL TUNNEL RELEASE   01/03/2012  Procedure: CARPAL TUNNEL RELEASE;  Surgeon: Wynonia Sours, MD;  Location: [facility];  Service: Orthopedics;  Laterality: Right;  . carpal tunnel release right hand Right 12/13  . COLONOSCOPY   05/2009    [id number] hemorrhoids otherwise normal colon, rectum and terminal ileum  . COLONOSCOPY WITH ESOPHAGOGASTRODUODENOSCOPY (EGD) N/A 10/28/2012    Procedure: COLONOSCOPY WITH ESOPHAGOGASTRODUODENOSCOPY (EGD);  Surgeon: Daneil Dolin, MD;  [facility];  Service: Endoscopy;  Laterality: N/A;  7:30  . DILITATION & CURRETTAGE/HYSTROSCOPY WITH NOVASURE ABLATION N/A 07/22/2014    Procedure: DILATATION & CURETTAGE/HYSTEROSCOPY WITH NOVASURE ABLATION; uterine length 6.0 cm; uterine width 3.8 cm; total ablation time 1 minute 15 seconds;  Surgeon: Florian Buff, MD;  Location: [facility];  Service: Gynecology;  Laterality: N/A;  . ESOPHAGOGASTRODUODENOSCOPY   5/013/2011    [id number] esophagus/multiple polyps removed s/p (hyperplastic). Gastritis  without H.pylori.  Marland Kitchen GASTRIC ROUX-EN-Y N/A 11/19/2012    Procedure: LAPAROSCOPIC ROUX-EN-Y GASTRIC;  Surgeon: Edward Jolly, MD;  Location: [facility];  Service: General;  Laterality: N/A;  . GIVENS CAPSULE STUDY N/A 10/28/2012    Procedure: GIVENS CAPSULE STUDY;  Surgeon: Daneil Dolin, MD;  [facility];  Service: Endoscopy;  Laterality: N/A;  . LAPAROSCOPY        due to endometriosis  . PELVIC LAPAROSCOPY   11/04/1999    with fulguration of endometriosis  . TRIGGER FINGER RELEASE Right 09/24/2012    Procedure: RELEASE A-1 PULLEY OF RIGHT THUMB;  Surgeon: Wynonia Sours, MD;  Location: [facility];  Service: Orthopedics;  Laterality: Right;  . TUBAL LIGATION   1993            Family History  Problem Relation Age of Onset  . Hypertension Father    . Hyperlipidemia Father    . COPD Mother    . Heart disease Mother    . Cancer Mother        Cervix  . Anesthesia problems Mother        post-op N/V  . Thyroid disease Sister    . Anesthesia problems Sister        post-op N/V  . Thyroid disease Paternal Uncle    . Diabetes Maternal Grandfather    . Diabetes Other        Social History     Social History  Substance Use Topics  . Smoking status: Never Smoker  . Smokeless tobacco: Never Used  . Alcohol use No           Allergies  Allergen Reactions  . Qvar [Beclomethasone] Shortness Of Breath      Patient states that this medication causes her to feel short of breath  . Codeine Nausea And Vomiting  . Prozac [Fluoxetine Hcl] Hives  . Dyazide [Hydrochlorothiazide W-Triamterene] Cough  . Erythromycin Nausea Only  . Lisinopril Cough  . Pravastatin Other (See Comments)      BODY ACHES, FLU-LIKE SX.  . Sulfonamide Derivatives Rash            Current Outpatient Prescriptions  Medication Sig Dispense Refill  . albuterol (PROVENTIL HFA;VENTOLIN HFA) 108 (90 Base) MCG/ACT inhaler Inhale 2 puffs into the lungs every 6 (six) hours as needed for wheezing.  1 Inhaler 6  . atorvastatin (LIPITOR) 10 MG tablet TAKE 1 TABLET(10 MG) BY MOUTH DAILY 90 tablet 1  . estradiol (ESTRACE) 2 MG tablet TAKE 1 TABLET BY MOUTH DAILY 90 tablet 3  . fluticasone (FLOVENT HFA)  220 MCG/ACT inhaler Inhale 2 puffs into the lungs 2 (two) times daily. 1 Inhaler 12  . Insulin Glargine (LANTUS SOLOSTAR) 100 UNIT/ML Solostar Pen Inject 6 Units into the skin every evening. May titrate up to 14 units 5 pen 5  . lamoTRIgine (LAMICTAL) 100 MG tablet TK ONE HALF T BID   3  . loratadine (CLARITIN) 10 MG tablet Take 10 mg by mouth daily.      Marland Kitchen losartan (COZAAR) 50 MG tablet TAKE 1 TABLET(50 MG) BY MOUTH DAILY 90 tablet 1  . metFORMIN (GLUCOPHAGE) 500 MG tablet TAKE 1 TABLET BY MOUTH TWICE DAILY WITH A MEAL 180 tablet 1  . metoprolol tartrate (LOPRESSOR) 25 MG tablet Take 1 tablet (25 mg total) by mouth 2 (two) times daily. 180 tablet 1  . Multiple Vitamin (MULTIVITAMIN WITH MINERALS) TABS tablet Take 2 tablets by mouth daily.       . progesterone (PROMETRIUM) 200 MG capsule TAKE 1 CAPSULE BY MOUTH EVERY EVENING 30 capsule 11  . venlafaxine (EFFEXOR) 75 MG tablet Take 75 mg by mouth 2 (two) times daily with a meal.    1  . vitamin B-12 (CYANOCOBALAMIN) 500 MCG tablet Take 1,000 mcg by mouth daily.         No current facility-administered medications for this visit.         Physical Exam   Blood pressure 130/87, pulse 87, temperature (!) 96.8 F (36 C), height 5\' 3"  (1.6 m), weight 187 lb (84.8 kg), last menstrual period 07/10/2013.   Constitutional: overall normal hygiene, normal nutrition, well developed, normal grooming, normal body habitus. Assistive device:none   Musculoskeletal: gait and station Limp right, muscle tone and strength are normal, no tremors or atrophy is present.  .  Neurological: coordination overall normal.  Deep tendon reflex/nerve stretch intact.  Sensation normal.  Cranial nerves II-XII intact.    Skin:   Normal overall no scars, lesions, ulcers or  rashes. No psoriasis.   Psychiatric: Alert and oriented x 3.  Recent memory intact, remote memory unclear.  Normal mood and affect. Well groomed.  Good eye contact.   Cardiovascular: overall no swelling, no varicosities, no edema bilaterally, normal temperatures of the legs and arms, no clubbing, cyanosis and good capillary refill.   Lymphatic: palpation is normal.   The right lower extremity is examined:   Inspection:             Thigh:  Non-tender and no defects             Knee has swelling 1+ effusion.                        Joint tenderness is present                        Patient is tender over the medial joint line             Lower Leg:  Has normal appearance and no tenderness or defects             Ankle:  Non-tender and no defects             Foot:  Non-tender and no defects Range of Motion:             Knee:  Range of motion is: 0-105  Crepitus is  present             Ankle:  Range of motion is normal. Strength and Tone:             The right lower extremity has normal strength and tone. Stability:             Knee:  The knee has positive medial McMurray.             Ankle:  The ankle is stable.       The patient has been educated about the nature of the problem(s) and counseled on treatment options.  The patient appeared to understand what I have discussed and is in agreement with it.      PLAN Arthroscopy right knee partial medial meniscectomy

## 2015-10-28 ENCOUNTER — Encounter (HOSPITAL_COMMUNITY)
Admission: RE | Disposition: A | Discharge status: Home / Self Care | Payer: Self-pay | Source: Ambulatory Visit | Attending: Orthopedic Surgery

## 2015-10-28 ENCOUNTER — Ambulatory Visit (HOSPITAL_COMMUNITY): Payer: Medicare Other | Admitting: Anesthesiology

## 2015-10-28 ENCOUNTER — Ambulatory Visit (HOSPITAL_COMMUNITY)
Admission: RE | Admit: 2015-10-28 | Discharge: 2015-10-28 | Disposition: A | Discharge status: Home / Self Care | Payer: Medicare Other | Source: Ambulatory Visit | Attending: Orthopedic Surgery | Admitting: Orthopedic Surgery

## 2015-10-28 ENCOUNTER — Encounter (HOSPITAL_COMMUNITY): Payer: Self-pay | Admitting: *Deleted

## 2015-10-28 DIAGNOSIS — M23303 Other meniscus derangements, unspecified medial meniscus, right knee: Secondary | ICD-10-CM | POA: Diagnosis not present

## 2015-10-28 DIAGNOSIS — S83241A Other tear of medial meniscus, current injury, right knee, initial encounter: Secondary | ICD-10-CM | POA: Diagnosis not present

## 2015-10-28 DIAGNOSIS — I10 Essential (primary) hypertension: Secondary | ICD-10-CM | POA: Insufficient documentation

## 2015-10-28 DIAGNOSIS — Z6833 Body mass index (BMI) 33.0-33.9, adult: Secondary | ICD-10-CM | POA: Insufficient documentation

## 2015-10-28 DIAGNOSIS — G4730 Sleep apnea, unspecified: Secondary | ICD-10-CM | POA: Insufficient documentation

## 2015-10-28 DIAGNOSIS — E119 Type 2 diabetes mellitus without complications: Secondary | ICD-10-CM | POA: Insufficient documentation

## 2015-10-28 DIAGNOSIS — M23321 Other meniscus derangements, posterior horn of medial meniscus, right knee: Secondary | ICD-10-CM

## 2015-10-28 DIAGNOSIS — E669 Obesity, unspecified: Secondary | ICD-10-CM | POA: Insufficient documentation

## 2015-10-28 DIAGNOSIS — K219 Gastro-esophageal reflux disease without esophagitis: Secondary | ICD-10-CM | POA: Insufficient documentation

## 2015-10-28 DIAGNOSIS — J45909 Unspecified asthma, uncomplicated: Secondary | ICD-10-CM | POA: Insufficient documentation

## 2015-10-28 HISTORY — PX: KNEE ARTHROSCOPY WITH MEDIAL MENISECTOMY: SHX5651

## 2015-10-28 LAB — GLUCOSE, CAPILLARY
Glucose-Capillary: 129 mg/dL — ABNORMAL HIGH (ref 65–99)
Glucose-Capillary: 162 mg/dL — ABNORMAL HIGH (ref 65–99)

## 2015-10-28 SURGERY — ARTHROSCOPY, KNEE, WITH MEDIAL MENISCECTOMY
Anesthesia: General | Laterality: Right

## 2015-10-28 MED ORDER — NEOSTIGMINE METHYLSULFATE 10 MG/10ML IV SOLN
INTRAVENOUS | Status: DC | PRN
  Administered 2015-10-28: 3 mg via INTRAVENOUS

## 2015-10-28 MED ORDER — BUPIVACAINE-EPINEPHRINE (PF) 0.5% -1:200000 IJ SOLN
INTRAMUSCULAR | Status: DC | PRN
  Administered 2015-10-28 (×2): 30 mL

## 2015-10-28 MED ORDER — DEXAMETHASONE SODIUM PHOSPHATE 4 MG/ML IJ SOLN
INTRAMUSCULAR | Status: AC
Start: 2015-10-28 — End: 2015-10-28
  Filled 2015-10-28: qty 1

## 2015-10-28 MED ORDER — CHLORHEXIDINE GLUCONATE 4 % EX LIQD: 60.0000 mL | Freq: Once | CUTANEOUS | Status: DC

## 2015-10-28 MED ORDER — PROMETHAZINE HCL 12.5 MG PO TABS: 12.5000 mg | ORAL_TABLET | Freq: Four times a day (QID) | ORAL | 0 refills | Status: DC | PRN

## 2015-10-28 MED ORDER — FENTANYL CITRATE (PF) 100 MCG/2ML IJ SOLN
INTRAMUSCULAR | Status: DC | PRN
  Administered 2015-10-28: 50 ug via INTRAVENOUS
  Administered 2015-10-28: 150 ug via INTRAVENOUS
  Administered 2015-10-28: 50 ug via INTRAVENOUS

## 2015-10-28 MED ORDER — SUCCINYLCHOLINE CHLORIDE 20 MG/ML IJ SOLN
INTRAMUSCULAR | Status: DC | PRN
  Administered 2015-10-28: 120 mg via INTRAVENOUS

## 2015-10-28 MED ORDER — BUPIVACAINE-EPINEPHRINE (PF) 0.5% -1:200000 IJ SOLN
INTRAMUSCULAR | Status: AC
  Filled 2015-10-28: qty 60

## 2015-10-28 MED ORDER — MIDAZOLAM HCL 5 MG/5ML IJ SOLN
INTRAMUSCULAR | Status: DC | PRN
  Administered 2015-10-28: 2 mg via INTRAVENOUS

## 2015-10-28 MED ORDER — HYDROMORPHONE HCL 1 MG/ML IJ SOLN: 0.2500 mg | INTRAMUSCULAR | Status: DC | PRN

## 2015-10-28 MED ORDER — ROCURONIUM BROMIDE 50 MG/5ML IV SOLN
INTRAVENOUS | Status: AC
  Filled 2015-10-28: qty 1

## 2015-10-28 MED ORDER — MIDAZOLAM HCL 2 MG/2ML IJ SOLN
INTRAMUSCULAR | Status: AC
  Filled 2015-10-28: qty 2

## 2015-10-28 MED ORDER — DEXAMETHASONE SODIUM PHOSPHATE 4 MG/ML IJ SOLN
4.0000 mg | Freq: Once | INTRAMUSCULAR | Status: AC
  Administered 2015-10-28: 4 mg via INTRAVENOUS

## 2015-10-28 MED ORDER — LACTATED RINGERS IV SOLN
INTRAVENOUS | Status: DC
Start: 2015-10-28 — End: 2015-10-30
  Administered 2015-10-28: 1000 mL via INTRAVENOUS

## 2015-10-28 MED ORDER — SODIUM CHLORIDE 0.9 % IR SOLN
Status: DC | PRN
  Administered 2015-10-28 (×3): 3000 mL

## 2015-10-28 MED ORDER — PROPOFOL 10 MG/ML IV BOLUS
INTRAVENOUS | Status: DC | PRN
  Administered 2015-10-28: 50 mg via INTRAVENOUS
  Administered 2015-10-28: 150 mg via INTRAVENOUS

## 2015-10-28 MED ORDER — SCOPOLAMINE 1 MG/3DAYS TD PT72
MEDICATED_PATCH | TRANSDERMAL | Status: AC
Start: 2015-10-28 — End: 2015-10-28
  Filled 2015-10-28: qty 1

## 2015-10-28 MED ORDER — HYDROCODONE-ACETAMINOPHEN 7.5-325 MG PO TABS: 1.0000 | ORAL_TABLET | ORAL | 0 refills | Status: DC | PRN

## 2015-10-28 MED ORDER — KETOROLAC TROMETHAMINE 30 MG/ML IJ SOLN
30.0000 mg | Freq: Once | INTRAMUSCULAR | Status: AC
  Administered 2015-10-28: 30 mg via INTRAVENOUS
  Filled 2015-10-28: qty 1

## 2015-10-28 MED ORDER — SCOPOLAMINE 1 MG/3DAYS TD PT72
1.0000 | MEDICATED_PATCH | Freq: Once | TRANSDERMAL | Status: DC
  Administered 2015-10-28: 1.5 mg via TRANSDERMAL

## 2015-10-28 MED ORDER — ROCURONIUM BROMIDE 100 MG/10ML IV SOLN
INTRAVENOUS | Status: DC | PRN
  Administered 2015-10-28: 25 mg via INTRAVENOUS

## 2015-10-28 MED ORDER — ONDANSETRON HCL 4 MG/2ML IJ SOLN
4.0000 mg | Freq: Once | INTRAMUSCULAR | Status: AC
  Administered 2015-10-28: 4 mg via INTRAVENOUS

## 2015-10-28 MED ORDER — HYDROCODONE-ACETAMINOPHEN 7.5-325 MG PO TABS: 1.0000 | ORAL_TABLET | Freq: Once | ORAL | Status: DC

## 2015-10-28 MED ORDER — FENTANYL CITRATE (PF) 250 MCG/5ML IJ SOLN
INTRAMUSCULAR | Status: AC
  Filled 2015-10-28: qty 5

## 2015-10-28 MED ORDER — ONDANSETRON HCL 4 MG/2ML IJ SOLN
4.0000 mg | Freq: Once | INTRAMUSCULAR | Status: AC
  Administered 2015-10-28: 4 mg via INTRAVENOUS
  Filled 2015-10-28: qty 2

## 2015-10-28 MED ORDER — EPINEPHRINE HCL 1 MG/ML IJ SOLN
INTRAMUSCULAR | Status: AC
  Filled 2015-10-28: qty 2

## 2015-10-28 MED ORDER — LIDOCAINE HCL (PF) 1 % IJ SOLN
INTRAMUSCULAR | Status: AC
  Filled 2015-10-28: qty 5

## 2015-10-28 MED ORDER — MIDAZOLAM HCL 2 MG/2ML IJ SOLN
1.0000 mg | INTRAMUSCULAR | Status: DC | PRN
Start: 2015-10-28 — End: 2015-10-30
  Administered 2015-10-28 (×2): 2 mg via INTRAVENOUS
  Filled 2015-10-28: qty 2

## 2015-10-28 MED ORDER — GLYCOPYRROLATE 0.2 MG/ML IJ SOLN
INTRAMUSCULAR | Status: DC | PRN
  Administered 2015-10-28: 0.4 mg via INTRAVENOUS

## 2015-10-28 MED ORDER — SUCCINYLCHOLINE CHLORIDE 20 MG/ML IJ SOLN
INTRAMUSCULAR | Status: AC
Start: 2015-10-28 — End: 2015-10-28
  Filled 2015-10-28: qty 1

## 2015-10-28 MED ORDER — CEFAZOLIN SODIUM-DEXTROSE 2-4 GM/100ML-% IV SOLN
2.0000 g | INTRAVENOUS | Status: AC
  Administered 2015-10-28: 2 g via INTRAVENOUS

## 2015-10-28 MED ORDER — CEFAZOLIN SODIUM-DEXTROSE 2-4 GM/100ML-% IV SOLN
INTRAVENOUS | Status: AC
  Filled 2015-10-28: qty 100

## 2015-10-28 MED ORDER — ONDANSETRON HCL 4 MG/2ML IJ SOLN
INTRAMUSCULAR | Status: AC
  Filled 2015-10-28: qty 2

## 2015-10-28 MED ORDER — LIDOCAINE HCL 1 % IJ SOLN
INTRAMUSCULAR | Status: DC | PRN
  Administered 2015-10-28: 50 mg via INTRADERMAL

## 2015-10-28 SURGICAL SUPPLY — 47 items
BAG HAMPER (MISCELLANEOUS) ×2 IMPLANT
BANDAGE ELASTIC 6 LF NS (GAUZE/BANDAGES/DRESSINGS) ×1 IMPLANT
BANDAGE ELASTIC 6 VELCRO NS (GAUZE/BANDAGES/DRESSINGS) ×2 IMPLANT
BLADE AGGRESSIVE PLUS 4.0 (BLADE) ×2 IMPLANT
BLADE SURG SZ11 CARB STEEL (BLADE) ×2 IMPLANT
BNDG CMPR MED 5X6 ELC HKLP NS (GAUZE/BANDAGES/DRESSINGS) ×1
CHLORAPREP W/TINT 26ML (MISCELLANEOUS) ×3 IMPLANT
CLOTH BEACON ORANGE TIMEOUT ST (SAFETY) ×2 IMPLANT
COOLER CRYO IC GRAV AND TUBE (ORTHOPEDIC SUPPLIES) ×2 IMPLANT
CUFF CRYO KNEE18X23 MED (MISCELLANEOUS) ×1 IMPLANT
CUFF TOURNIQUET SINGLE 34IN LL (TOURNIQUET CUFF) ×1 IMPLANT
DECANTER SPIKE VIAL GLASS SM (MISCELLANEOUS) ×4 IMPLANT
GAUZE SPONGE 4X4 12PLY STRL (GAUZE/BANDAGES/DRESSINGS) ×2 IMPLANT
GAUZE SPONGE 4X4 16PLY XRAY LF (GAUZE/BANDAGES/DRESSINGS) ×2 IMPLANT
GAUZE XEROFORM 5X9 LF (GAUZE/BANDAGES/DRESSINGS) ×2 IMPLANT
GLOVE BIOGEL PI IND STRL 7.0 (GLOVE) ×1 IMPLANT
GLOVE BIOGEL PI INDICATOR 7.0 (GLOVE) ×2
GLOVE ECLIPSE 6.5 STRL STRAW (GLOVE) ×1 IMPLANT
GLOVE SKINSENSE NS SZ8.0 LF (GLOVE) ×1
GLOVE SKINSENSE STRL SZ8.0 LF (GLOVE) ×1 IMPLANT
GLOVE SS N UNI LF 8.5 STRL (GLOVE) ×2 IMPLANT
GOWN STRL REUS W/ TWL LRG LVL3 (GOWN DISPOSABLE) ×1 IMPLANT
GOWN STRL REUS W/TWL LRG LVL3 (GOWN DISPOSABLE) ×2
GOWN STRL REUS W/TWL XL LVL3 (GOWN DISPOSABLE) ×2 IMPLANT
HLDR LEG FOAM (MISCELLANEOUS) ×1 IMPLANT
IV NS IRRIG 3000ML ARTHROMATIC (IV SOLUTION) ×5 IMPLANT
KIT BLADEGUARD II DBL (SET/KITS/TRAYS/PACK) ×2 IMPLANT
KIT ROOM TURNOVER AP CYSTO (KITS) ×2 IMPLANT
LEG HOLDER FOAM (MISCELLANEOUS) ×1
MANIFOLD NEPTUNE II (INSTRUMENTS) ×2 IMPLANT
MARKER SKIN DUAL TIP RULER LAB (MISCELLANEOUS) ×2 IMPLANT
NDL HYPO 18GX1.5 BLUNT FILL (NEEDLE) ×1 IMPLANT
NDL HYPO 21X1.5 SAFETY (NEEDLE) ×1 IMPLANT
NDL SPNL 18GX3.5 QUINCKE PK (NEEDLE) ×1 IMPLANT
NEEDLE HYPO 18GX1.5 BLUNT FILL (NEEDLE) ×2 IMPLANT
NEEDLE HYPO 21X1.5 SAFETY (NEEDLE) ×2 IMPLANT
NEEDLE SPNL 18GX3.5 QUINCKE PK (NEEDLE) ×2 IMPLANT
PACK ARTHRO LIMB DRAPE STRL (MISCELLANEOUS) ×2 IMPLANT
PAD ABD 5X9 TENDERSORB (GAUZE/BANDAGES/DRESSINGS) ×2 IMPLANT
PAD ARMBOARD 7.5X6 YLW CONV (MISCELLANEOUS) ×2 IMPLANT
PADDING CAST COTTON 6X4 STRL (CAST SUPPLIES) ×2 IMPLANT
SET ARTHROSCOPY INST (INSTRUMENTS) ×2 IMPLANT
SET ARTHROSCOPY PUMP TUBE (IRRIGATION / IRRIGATOR) ×2 IMPLANT
SET BASIN LINEN APH (SET/KITS/TRAYS/PACK) ×2 IMPLANT
SUT ETHILON 3 0 FSL (SUTURE) ×2 IMPLANT
SYR 30ML LL (SYRINGE) ×2 IMPLANT
TUBE CONNECTING 12X1/4 (SUCTIONS) ×6 IMPLANT

## 2015-10-28 NOTE — Anesthesia Preprocedure Evaluation (Signed)
Anesthesia Evaluation  Patient identified by MRN, date of birth, ID band Patient awake    Reviewed: Allergy & Precautions, H&P , NPO status , Patient's Chart, lab work & pertinent test results  History of Anesthesia Complications (+) PONV and history of anesthetic complications  Airway Mallampati: III  TM Distance: <3 FB Neck ROM: Full    Dental no notable dental hx.    Pulmonary shortness of breath, asthma , sleep apnea ,    breath sounds clear to auscultation + decreased breath sounds      Cardiovascular hypertension, Pt. on medications Normal cardiovascular exam Rhythm:Regular Rate:Normal     Neuro/Psych  Headaches, PSYCHIATRIC DISORDERS Anxiety Depression negative neurological ROS  negative psych ROS   GI/Hepatic Neg liver ROS, hiatal hernia, GERD  Medicated,  Endo/Other  diabetes, Type 2, Insulin DependentMorbid obesity  Renal/GU negative Renal ROS  negative genitourinary   Musculoskeletal negative musculoskeletal ROS (+)   Abdominal   Peds negative pediatric ROS (+)  Hematology negative hematology ROS (+)   Anesthesia Other Findings   Reproductive/Obstetrics negative OB ROS                             Anesthesia Physical Anesthesia Plan  ASA: III  Anesthesia Plan: General   Post-op Pain Management:    Induction: Intravenous, Rapid sequence and Cricoid pressure planned  Airway Management Planned: Oral ETT  Additional Equipment:   Intra-op Plan:   Post-operative Plan: Extubation in OR  Informed Consent: I have reviewed the patients History and Physical, chart, labs and discussed the procedure including the risks, benefits and alternatives for the proposed anesthesia with the patient or authorized representative who has indicated his/her understanding and acceptance.   Dental advisory given  Plan Discussed with: CRNA and Surgeon  Anesthesia Plan Comments:          Anesthesia Quick Evaluation

## 2015-10-28 NOTE — Interval H&P Note (Signed)
History and Physical Interval Note:  10/28/2015 1:16 PM BP 124/82   Pulse 80   Temp 98.7 F (37.1 C) (Oral)   Resp 13   Ht (P) 5\' 3"  (1.6 m)   Wt (P) 188 lb (85.3 kg)   LMP 07/10/2013   SpO2 96%   BMI (P) 33.30 kg/m  Surgical site clean Carrie Cook  has presented today for surgery, with the diagnosis of right knee medial meniscus tear  The various methods of treatment have been discussed with the patient and family. After consideration of risks, benefits and other options for treatment, the patient has consented to  Procedure(s) with comments: KNEE ARTHROSCOPY WITH MEDIAL MENISECTOMY (Right) - crutch training?? as a surgical intervention .  The patient's history has been reviewed, patient examined, no change in status, stable for surgery.  I have reviewed the patient's chart and labs.  Questions were answered to the patient's satisfaction.     Arther Abbott

## 2015-10-28 NOTE — Op Note (Signed)
10/28/2015  2:20 PM  PATIENT:  Carrie Cook  55 y.o. female  PRE-OPERATIVE DIAGNOSIS:  right knee medial meniscus tear  POST-OPERATIVE DIAGNOSIS:  right knee medial meniscus tear, osteoarthritis grade 4 medial femoral condyle and tibial plateau  PROCEDURE:  Procedure(s): RIGHT KNEE ARTHROSCOPY WITH MEDIAL MENISECTOMY (Right)   Surgical findings Medial condyle large grade 4 lesion on the medial femoral condyle as well as moderate size kissing lesion on the tibial side. Torn medial meniscus primarily at the body.  Anterior cruciate ligament PCL intact  Lateral meniscus and lateral compartment intact  Patellofemoral joint mild chondromalacia grade 1 grade 2.  Details of procedure  Knee arthroscopy dictation  The patient was identified in the preoperative holding area using 2 approved identification mechanisms. The chart was reviewed and updated. The surgical site was confirmed as right knee and marked with an indelible marker.  The patient was taken to the operating room for anesthesia. After successful  general anesthesia, Ancef 2 g was used as IV antibiotics.  The patient was placed in the supine position with the (right) the operative extremity in an arthroscopic leg holder and the opposite extremity in a padded leg holder.  The timeout was executed.  A lateral portal was established with an 11 blade and the scope was introduced into the joint. A diagnostic arthroscopy was performed in circumferential manner examining the entire knee joint. A medial portal was established and the diagnostic arthroscopy was repeated using a probe to palpate intra-articular structures as they were encountered.    The medial meniscus was resected using a duckbill forceps. The meniscal fragments were removed with a motorized shaver. The meniscus was balanced with a combination of a motorized shaver until a stable rim was obtained.  The arthroscopic pump was placed on the wash mode and any excess  debris was removed from the joint using suction.  60 cc of Marcaine with epinephrine was injected through the arthroscope.  The portals were closed with 3-0 nylon suture.  A sterile bandage, Ace wrap and Cryo/Cuff was placed and the Cryo/Cuff was activated. The patient was taken to the recovery room in stable condition.   SURGEON:  Surgeon(s) and Role:    * Carole Civil, MD - Primary  PHYSICIAN ASSISTANT:   ASSISTANTS: none   ANESTHESIA:   general  EBL:  Total I/O In: 900 [I.V.:900] Out: -   BLOOD ADMINISTERED:none  DRAINS: none   LOCAL MEDICATIONS USED:  MARCAINE     SPECIMEN:  No Specimen  DISPOSITION OF SPECIMEN:  none  COUNTS:  YES  TOURNIQUET:    DICTATION: .Dragon Dictation  PLAN OF CARE: Discharge to home after PACU  PATIENT DISPOSITION:  PACU - hemodynamically stable.   Delay start of Pharmacological VTE agent (>24hrs) due to surgical blood loss or risk of bleeding: yes

## 2015-10-28 NOTE — Brief Op Note (Signed)
10/28/2015  2:20 PM  PATIENT:  Carrie Cook  55 y.o. female  PRE-OPERATIVE DIAGNOSIS:  right knee medial meniscus tear  POST-OPERATIVE DIAGNOSIS:  right knee medial meniscus tear, osteoarthritis grade 4 medial femoral condyle and tibial plateau  PROCEDURE:  Procedure(s): RIGHT KNEE ARTHROSCOPY WITH MEDIAL MENISECTOMY (Right)   Surgical findings Medial condyle large grade 4 lesion on the medial femoral condyle as well as moderate size kissing lesion on the tibial side. Torn medial meniscus primarily at the body.  Anterior cruciate ligament PCL intact  Lateral meniscus and lateral compartment intact  Patellofemoral joint mild chondromalacia grade 1 grade 2.  Details of procedure  Knee arthroscopy dictation  The patient was identified in the preoperative holding area using 2 approved identification mechanisms. The chart was reviewed and updated. The surgical site was confirmed as right knee and marked with an indelible marker.  The patient was taken to the operating room for anesthesia. After successful  general anesthesia, Ancef 2 g was used as IV antibiotics.  The patient was placed in the supine position with the (right) the operative extremity in an arthroscopic leg holder and the opposite extremity in a padded leg holder.  The timeout was executed.  A lateral portal was established with an 11 blade and the scope was introduced into the joint. A diagnostic arthroscopy was performed in circumferential manner examining the entire knee joint. A medial portal was established and the diagnostic arthroscopy was repeated using a probe to palpate intra-articular structures as they were encountered.    The medial meniscus was resected using a duckbill forceps. The meniscal fragments were removed with a motorized shaver. The meniscus was balanced with a combination of a motorized shaver until a stable rim was obtained.  The arthroscopic pump was placed on the wash mode and any excess  debris was removed from the joint using suction.  60 cc of Marcaine with epinephrine was injected through the arthroscope.  The portals were closed with 3-0 nylon suture.  A sterile bandage, Ace wrap and Cryo/Cuff was placed and the Cryo/Cuff was activated. The patient was taken to the recovery room in stable condition.   SURGEON:  Surgeon(s) and Role:    * Carole Civil, MD - Primary  PHYSICIAN ASSISTANT:   ASSISTANTS: none   ANESTHESIA:   general  EBL:  Total I/O In: 900 [I.V.:900] Out: -   BLOOD ADMINISTERED:none  DRAINS: none   LOCAL MEDICATIONS USED:  MARCAINE     SPECIMEN:  No Specimen  DISPOSITION OF SPECIMEN:  none  COUNTS:  YES  TOURNIQUET:    DICTATION: .Dragon Dictation  PLAN OF CARE: Discharge to home after PACU  PATIENT DISPOSITION:  PACU - hemodynamically stable.   Delay start of Pharmacological VTE agent (>24hrs) due to surgical blood loss or risk of bleeding: yes

## 2015-10-28 NOTE — Anesthesia Procedure Notes (Signed)
Procedure Name: Intubation Date/Time: 10/28/2015 1:30 PM Performed by: Gershon Mussel, Georgene Kopper Pre-anesthesia Checklist: Patient identified, Patient being monitored, Timeout performed, Emergency Drugs available and Suction available Patient Re-evaluated:Patient Re-evaluated prior to inductionOxygen Delivery Method: Circle system utilized Preoxygenation: Pre-oxygenation with 100% oxygen Intubation Type: IV induction, Rapid sequence and Cricoid Pressure applied Ventilation: Mask ventilation without difficulty Laryngoscope Size: Miller and 2 Grade View: Grade I Tube type: Oral Tube size: 7.0 mm Number of attempts: 1 Airway Equipment and Method: Stylet Placement Confirmation: ETT inserted through vocal cords under direct vision,  positive ETCO2 and breath sounds checked- equal and bilateral Secured at: 21 cm Tube secured with: Tape Dental Injury: Teeth and Oropharynx as per pre-operative assessment

## 2015-10-28 NOTE — Transfer of Care (Signed)
Immediate Anesthesia Transfer of Care Note  Patient: Carrie Cook  Procedure(s) Performed: Procedure(s): RIGHT KNEE ARTHROSCOPY WITH MEDIAL MENISECTOMY (Right)  Patient Location: PACU  Anesthesia Type:General  Level of Consciousness: awake, alert , patient cooperative and responds to stimulation  Airway & Oxygen Therapy: Patient Spontanous Breathing and Patient connected to face mask oxygen  Post-op Assessment: Report given to RN, Post -op Vital signs reviewed and stable and Patient moving all extremities  Post vital signs: Reviewed and stable  Last Vitals:  Vitals:   10/28/15 1305 10/28/15 1315  BP: 124/82 120/77  Pulse:    Resp: 13 16  Temp:      Last Pain:  Vitals:   10/28/15 1146  TempSrc: Oral  PainSc:       Patients Stated Pain Goal: 9 ([id number] [id number])  Complications: No apparent anesthesia complications

## 2015-10-28 NOTE — Discharge Instructions (Signed)
.  Knee Arthroscopy, Care After Refer to this sheet in the next few weeks. These instructions provide you with information about caring for yourself after your procedure. Your health care provider may also give you more specific instructions. Your treatment has been planned according to current medical practices, but problems sometimes occur. Call your health care provider if you have any problems or questions after your procedure. WHAT TO EXPECT AFTER THE PROCEDURE After your procedure, it is common to have:  Soreness.  Pain. HOME CARE INSTRUCTIONS Bathing  Do not take baths, swim, or use a hot tub until your health care provider approves. Incision Care  There are many different ways to close and cover an incision, including stitches, skin glue, and adhesive strips. Follow your health care provider's instructions about:  Incision care.  Bandage (dressing) changes and removal.  Incision closure removal.  Check your incision area every day for signs of infection. Watch for:  Redness, swelling, or pain.  Fluid, blood, or pus. Activity  Avoid strenuous activities for as long as directed by your health care provider.  Return to your normal activities as directed by your health care provider. Ask your health care provider what activities are safe for you.  Perform range-of-motion exercises only as directed by your health care provider.  Do not lift anything that is heavier than 10 lb (4.5 kg).  Do not drive or operate heavy machinery while taking pain medicine.  If you were given crutches, use them as directed by your health care provider. Managing Pain, Stiffness, and Swelling  If directed, apply ice to the injured area:  Put ice in a plastic bag.  Place a towel between your skin and the bag.  Leave the ice on for 20 minutes, 2-3 times per day.  Raise the injured area above the level of your heart while you are sitting or lying down as directed by your health care  provider. General Instructions  Keep all follow-up visits as directed by your health care provider. This is important.  Take medicines only as directed by your health care provider.  Do not use any tobacco products, including cigarettes, chewing tobacco, or electronic cigarettes. If you need help quitting, ask your health care provider.  If you were given compression stockings, wear them as directed by your health care provider. These stockings help prevent blood clots and reduce swelling in your legs. SEEK MEDICAL CARE IF:  You have severe pain with any movement of your knee.  You notice a bad smell coming from the incision or dressing.  You have redness, swelling, or pain at the site of your incision.  You have fluid, blood, or pus coming from your incision. SEEK IMMEDIATE MEDICAL CARE IF:  You develop a rash.  You have a fever.  You have difficulty breathing or have shortness of breath.  You develop pain in your calves or in the back of your knee.  You develop chest pain.  You develop numbness or tingling in your leg or foot.   This information is not intended to replace advice given to you by your health care provider. Make sure you discuss any questions you have with your health care provider.   Document Released: 07/29/2004 Document Revised: 05/26/2014 Document Reviewed: 01/05/2014 Elsevier Interactive Patient Education [employer].

## 2015-10-28 NOTE — Anesthesia Postprocedure Evaluation (Signed)
Anesthesia Post Note  Patient: Carrie Cook  Procedure(s) Performed: Procedure(s) (LRB): RIGHT KNEE ARTHROSCOPY WITH MEDIAL MENISECTOMY (Right)  Patient location during evaluation: PACU Anesthesia Type: General Level of consciousness: awake, awake and alert, responds to stimulation and patient cooperative Pain management: pain level controlled Vital Signs Assessment: post-procedure vital signs reviewed and stable Respiratory status: spontaneous breathing, respiratory function stable and non-rebreather facemask Cardiovascular status: stable Anesthetic complications: no    Last Vitals:  Vitals:   10/28/15 1305 10/28/15 1315  BP: 124/82 120/77  Pulse:    Resp: 13 16  Temp:      Last Pain:  Vitals:   10/28/15 1146  TempSrc: Oral  PainSc:                  [name]

## 2015-11-01 ENCOUNTER — Encounter: Payer: Self-pay | Admitting: Orthopedic Surgery

## 2015-11-01 ENCOUNTER — Ambulatory Visit (INDEPENDENT_AMBULATORY_CARE_PROVIDER_SITE_OTHER): Payer: Medicare Other | Admitting: Orthopedic Surgery

## 2015-11-01 VITALS — BP 131/79 | HR 96 | Wt 187.0 lb

## 2015-11-01 DIAGNOSIS — Z4889 Encounter for other specified surgical aftercare: Secondary | ICD-10-CM

## 2015-11-01 DIAGNOSIS — Z9889 Other specified postprocedural states: Secondary | ICD-10-CM

## 2015-11-01 MED ORDER — HYDROCODONE-ACETAMINOPHEN 7.5-325 MG PO TABS: 1.0000 | ORAL_TABLET | ORAL | 0 refills | Status: DC | PRN

## 2015-11-01 NOTE — Progress Notes (Signed)
PRE-OPERATIVE DIAGNOSIS:  right knee medial meniscus tear   POST-OPERATIVE DIAGNOSIS:  right knee medial meniscus tear, osteoarthritis grade 4 medial femoral condyle and tibial plateau   PROCEDURE:  Procedure(s): RIGHT KNEE ARTHROSCOPY WITH MEDIAL MENISECTOMY (Right)    Surgical findings Medial condyle large grade 4 lesion on the medial femoral condyle as well as moderate size kissing lesion on the tibial side. Torn medial meniscus primarily at the body.   Anterior cruciate ligament PCL intact   Lateral meniscus and lateral compartment intact   Patellofemoral joint mild chondromalacia grade 1 grade 2. Patient ID: Carrie Cook, female   DOB: 1960-08-04, 55 y.o.   MRN: [id number]  Post op visit   Chief Complaint  Patient presents with  . Follow-up    POST OP 1, SARK MM, DOS 10/28/15    BP 131/79   Pulse 96   Wt 187 lb (84.8 kg)   LMP 07/10/2013   BMI 33.13 kg/m    Portals clean sutures taken out  Calf and ankle normal   Didn t get pain meds   PT in [place]   3 weeks f/u

## 2015-11-01 NOTE — Patient Instructions (Signed)
Continue ice ibuprofen and add pain medication as needed

## 2015-11-01 NOTE — Addendum Note (Signed)
Addended by: Baldomero Lamy B on: 11/01/2015 09:14 AM   Modules accepted: Orders

## 2015-11-02 ENCOUNTER — Encounter (HOSPITAL_COMMUNITY): Payer: Self-pay | Admitting: Orthopedic Surgery

## 2015-11-04 ENCOUNTER — Ambulatory Visit (HOSPITAL_COMMUNITY): Payer: Medicare Other | Attending: Family Medicine | Admitting: Physical Therapy

## 2015-11-04 ENCOUNTER — Encounter (HOSPITAL_COMMUNITY): Payer: Self-pay | Admitting: Orthopedic Surgery

## 2015-11-04 DIAGNOSIS — R2689 Other abnormalities of gait and mobility: Secondary | ICD-10-CM | POA: Diagnosis not present

## 2015-11-04 DIAGNOSIS — M25661 Stiffness of right knee, not elsewhere classified: Secondary | ICD-10-CM

## 2015-11-04 DIAGNOSIS — M25561 Pain in right knee: Secondary | ICD-10-CM

## 2015-11-04 DIAGNOSIS — R600 Localized edema: Secondary | ICD-10-CM

## 2015-11-04 DIAGNOSIS — R2681 Unsteadiness on feet: Secondary | ICD-10-CM

## 2015-11-04 NOTE — Therapy (Signed)
[facility] [address] [place], [place], [address] Phone: [phone]   Fax:  [phone]  Physical Therapy Evaluation  Patient Details  Name: Carrie Cook MRN: [id number] Date of Birth: [birth year] Referring Provider: Arther Abbott, MD  Encounter Date: 11/04/2015      PT End of Session - 11/04/15 1455    Visit Number 1   Number of Visits 12   Date for PT Re-Evaluation 11/25/15   Authorization Type Medicare A & B   Authorization Time Period 11/04/15 to 12/16/15   PT Start Time 1301   PT Stop Time 1342   PT Time Calculation (min) 41 min   Activity Tolerance Patient tolerated treatment well;No increased pain   Behavior During Therapy WFL for tasks assessed/performed      Past Medical History:  Diagnosis Date  . Anemia    PT HAS HAD COLONOSCOPY AND ENDOSCOPY WORK UP - NO PROBLEMS FOUND AS SOURCE OF ANEMIA -- PT HAD IRON INFUSION AT [facility] 11/13/12-AFTER SEEING HEMATOLOGIST DR. Lakeland AND HE GAVE HEMATOLOGIC CLEARANCE FOR GASTRIC BYPASS SURGERY.  Marland Kitchen Anxiety   . Asthma    daily and prn inhalers  . Degenerative joint disease    right knee, spine - STATES INTERMITTENT  NUMBNESS DOWN RT LEG WITH PROLONGED STANDING OR WALKING- THINKS RELATED TO HER SPINE PROBLEMS  . Dental crowns present   . Depression   . Family history of anesthesia complication    pt's mother and sister have hx. of post-op N/V  . Fibroids    UTERINE  . Gastroesophageal reflux disease    none for 2 years since Bariatric surgery.  . H/O hiatal hernia   . History of endometriosis   . Hyperlipidemia    no current med.  . Hypertension    under control with med., has been on med. x 2 yr.  . Insulin dependent diabetes mellitus ([place])   . Lock jaw    jaw locks open if opens mouth wide  . Migraines   . Neuropathy ([place])    FEET  . Obesity   . Palpitations   . PONV (postoperative nausea and vomiting)   . Shortness of breath    with daily activities   . Sleep apnea    hasnt had to use CPAP in 2 years since she lost weight    Past Surgical History:  Procedure Laterality Date  . BREATH TEK H PYLORI N/A 08/13/2012   Procedure: BREATH TEK H PYLORI;  Surgeon: Edward Jolly, MD;  Location: [facility];  Service: General;  Laterality: N/A;  . CARPAL TUNNEL RELEASE  01/03/2012   Procedure: CARPAL TUNNEL RELEASE;  Surgeon: Wynonia Sours, MD;  Location: [facility];  Service: Orthopedics;  Laterality: Right;  . carpal tunnel release right hand Right 12/13  . COLONOSCOPY  05/2009   [id number] hemorrhoids otherwise normal colon, rectum and terminal ileum  . COLONOSCOPY WITH ESOPHAGOGASTRODUODENOSCOPY (EGD) N/A 10/28/2012   Procedure: COLONOSCOPY WITH ESOPHAGOGASTRODUODENOSCOPY (EGD);  Surgeon: Daneil Dolin, MD;  [facility];  Service: Endoscopy;  Laterality: N/A;  7:30  . DILITATION & CURRETTAGE/HYSTROSCOPY WITH NOVASURE ABLATION N/A 07/22/2014   Procedure: DILATATION & CURETTAGE/HYSTEROSCOPY WITH NOVASURE ABLATION; uterine length 6.0 cm; uterine width 3.8 cm; total ablation time 1 minute 15 seconds;  Surgeon: Florian Buff, MD;  Location: [facility];  Service: Gynecology;  Laterality: N/A;  . ESOPHAGOGASTRODUODENOSCOPY  5/013/2011   [id number] esophagus/multiple polyps removed s/p (hyperplastic). Gastritis without H.pylori.  Marland Kitchen  GASTRIC ROUX-EN-Y N/A 11/19/2012   Procedure: LAPAROSCOPIC ROUX-EN-Y GASTRIC;  Surgeon: Edward Jolly, MD;  Location: [facility];  Service: General;  Laterality: N/A;  . GIVENS CAPSULE STUDY N/A 10/28/2012   Procedure: GIVENS CAPSULE STUDY;  Surgeon: Daneil Dolin, MD;  [facility];  Service: Endoscopy;  Laterality: N/A;  . KNEE ARTHROSCOPY WITH MEDIAL MENISECTOMY Right 10/28/2015   Procedure: RIGHT KNEE ARTHROSCOPY WITH MEDIAL MENISECTOMY;  Surgeon: Carole Civil, MD;  Location: [facility];  Service: Orthopedics;  Laterality: Right;  . LAPAROSCOPY     due to endometriosis  .  PELVIC LAPAROSCOPY  11/04/1999   with fulguration of endometriosis  . TRIGGER FINGER RELEASE Right 09/24/2012   Procedure: RELEASE A-1 PULLEY OF RIGHT THUMB;  Surgeon: Wynonia Sours, MD;  Location: [facility];  Service: Orthopedics;  Laterality: Right;  . TUBAL LIGATION  1993    There were no vitals filed for this visit.       Subjective Assessment - 11/04/15 1308    Subjective Pt reports she has had knee pain and issues for so long that she couldn't get her housework done. She put it off for years then was referred to the orthopedic MD who diagnosed her with Rt medial meniscus tear and performed surgery on 10/28/15.    Pertinent History Asthma, anxiety, HTN, DM, Rt knee scope 10/28/15   Limitations Walking;Other (comment)  sleeping, unable to sleep on her side   How long can you sit comfortably? 20 minutes    How long can you stand comfortably? less than 1 minute   How long can you walk comfortably? unable to walk without pain    Patient Stated Goals improve flexibility and walking pattern so that she can take walks to improve her fitness   Currently in Pain? No/denies   Pain Location Knee   Pain Orientation Right;Medial   Pain Descriptors / Indicators Sharp;Stabbing   Pain Type Surgical pain   Pain Radiating Towards shoots down to her foot occasionally    Pain Onset 1 to 4 weeks ago   Pain Frequency Intermittent   Aggravating Factors  walking, standing for long periods of time, vacuuming    Pain Relieving Factors keeping knee slightly bent, ice    Effect of Pain on Daily Activities limited ADLs            [facility] PT Assessment - 11/04/15 0001      Assessment   Medical Diagnosis s/p Rt knee scope    Referring Provider Arther Abbott, MD   Onset Date/Surgical Date 10/28/15   Next MD Visit 11/23/15   Prior Therapy none      Precautions   Precautions None     Balance Screen   Has the patient fallen in the past 6 months No   Has the patient had a decrease in  activity level because of a fear of falling?  No   Is the patient reluctant to leave their home because of a fear of falling?  No     Home [employer] residence     Prior Function   Level of Independence Independent   Leisure care for her grandchildren, horses, cats     Cognition   Overall Cognitive Status Within Functional Limits for tasks assessed     Observation/Other Assessments   Observations sitting with Rt knee in slight extension    Focus on Therapeutic Outcomes (FOTO)  67% limited  Sensation   Additional Comments reports Rt LE numbness from knee down to her big toe during standing, walking activity      ROM / Strength   AROM / PROM / Strength AROM;Strength     AROM   AROM Assessment Site Knee   Right/Left Knee Right;Left   Right Knee Extension 5  lacking    Right Knee Flexion 118   Left Knee Extension 0   Left Knee Flexion 140     Strength   Strength Assessment Site Hip;Knee;Ankle   Right/Left Hip Right;Left   Right Hip Flexion 5/5   Right Hip Extension 4+/5   Right Hip ABduction 4+/5   Left Hip Flexion 5/5   Left Hip Extension 4+/5   Left Hip ABduction 5/5   Right/Left Knee Right;Left   Right Knee Flexion 5/5   Right Knee Extension 5/5   Left Knee Flexion 5/5   Left Knee Extension 5/5   Right/Left Ankle Right;Left   Right Ankle Dorsiflexion 5/5   Left Ankle Dorsiflexion 5/5     Palpation   Palpation comment TTP along rt distal adductor, medial jt line      Transfers   Five time sit to stand comments  15.3 sec, no UE, weight shift Lt    Comments SLS: B 20 sec without LOB      6 minute walk test results    Endurance additional comments 3MWT: 462 ft with quad cane                    [facility] Adult PT Treatment/Exercise - 11/04/15 0001      Ambulation/Gait   Ambulation/Gait Yes   Ambulation/Gait Assistance 6: Modified independent (Device/Increase time)   Ambulation Distance (Feet) 80 Feet   Assistive  device Large base quad cane   Gait Pattern Decreased arm swing - right;Decreased step length - left;Decreased stance time - right;Decreased hip/knee flexion - right   Ambulation Surface Level   Pre-Gait Activities Pt instructed in correct hand to use AD, also educated on proper gait pattern with improvements noted by end of session with and without AD                PT Education - 11/04/15 1528    Education provided Yes   Education Details eval findings/POC; technique with sit to stand; gait training with and without AD; HEP   Person(s) Educated Patient   Methods Explanation;Demonstration;Handout;Verbal cues   Comprehension Verbalized understanding;Returned demonstration          PT Short Term Goals - 11/04/15 1538      PT SHORT TERM GOAL #1   Title Pt will demo consistency and independence with HEP to improve strength and ROM.    Time 2   Period Weeks   Status New     PT SHORT TERM GOAL #2   Title Pt will demo correct sit to stand technique without noted weight shift and without cues from therapist x5 reps.    Time 2   Period Weeks   Status New     PT SHORT TERM GOAL #3   Title Pt will demo correct heel toe sequencing without AD for atleast 200 ft without cues from therapist to decrease her risk of injury from compensation.    Time 4   Period Weeks   Status New           PT Long Term Goals - 11/04/15 1543      PT LONG TERM GOAL #1  Title Pt will demo improved BLE strength to 5/5 MMT to decrease risk of injury with fucntional activity.    Time 6   Period Weeks   Status New     PT LONG TERM GOAL #2   Title Pt will demo improved functional strength evident by her ability to perform 5x sit to stand in less than 13 sec without UE and without significant weight shift    Time 6   Period Weeks   Status New     PT LONG TERM GOAL #3   Title Pt will demo improved endurance evident by atleast a 277ft improvement in 3MWT distance with LRAD   Time 6   Period  Weeks   Status New     PT LONG TERM GOAL #4   Title Pt will demo improved Rt knee ROM from atleast 0-120 deg to improve mechanics with functional tasks.    Time 6   Period Weeks   Status New               Plan - 11/22/15 1443    Clinical Impression Statement Pt is a pleasant 55yo F referred to OPPT s/p Rt knee meniscectomy. She presents with post-surgical pain, swelling and antalgic gait pattern limiting her independence with activity and ADLs. She demonstrates minor weakness in her hip musculature and limited Rt knee ROM 0-5-118 degrees. I discussed evaluation and POC with pt and educated her on the importance of correcting mechanics with sit to stand and I adjusted her quad cane to improve gait mechanics with increased step length and heel strike following adjustment. Pt would benefit from skilled PT to address her impairments and increase her independence with activity.   Rehab Potential Good   PT Frequency Other (comment)  2x/week initially, likely will need to decrease to 1x/week after 2-3 visits    PT Duration 6 weeks   PT Treatment/Interventions Cryotherapy;ADLs/Self Care Home Management;Moist Heat;Gait training;Stair training;Functional mobility training;Therapeutic activities;Therapeutic exercise;Patient/family education;Neuromuscular re-education;Balance training;Manual techniques;Passive range of motion   PT Next Visit Plan review sit to stand, therex to improve knee extension/flexion ROM, gait training without AD   PT Home Exercise Plan knee flexion stretch 10x10sec, knee ext stretch x30 min, walking 10 min/day focus on proper technique, sit to stand x10    Recommended Other Services none    Consulted and Agree with Plan of Care Patient      Patient will benefit from skilled therapeutic intervention in order to improve the following deficits and impairments:  Abnormal gait, Decreased activity tolerance, Decreased balance, Decreased endurance, Decreased mobility, Decreased  skin integrity, Difficulty walking, Hypomobility, Increased muscle spasms, Impaired sensation, Improper body mechanics, Pain, Postural dysfunction, Decreased strength, Increased fascial restricitons, Impaired flexibility, Decreased scar mobility, Decreased range of motion, Increased edema  Visit Diagnosis: Acute pain of right knee  Stiffness of right knee, not elsewhere classified  Unsteadiness on feet  Other abnormalities of gait and mobility  Localized edema      G-Codes - 11-22-2015 1505    Functional Assessment Tool Used FOTO: 67% limited   Functional Limitation Mobility: Walking and moving around   Mobility: Walking and Moving Around Current Status [phone]) At least 60 percent but less than 80 percent impaired, limited or restricted   Mobility: Walking and Moving Around Goal Status [phone]) At least 20 percent but less than 40 percent impaired, limited or restricted       Problem List Patient Active Problem List   Diagnosis Date Noted  . Derangement of  posterior horn of medial meniscus of right knee   . Right knee pain 09/10/2015  . Iron deficiency anemia 10/16/2013  . Fibroids 08/21/2013  . Excessive or frequent menstruation 06/19/2013  . Morbid obesity ([place]) 07/12/2012  . Obstructive sleep apnea 06/21/2012  . Chest pain   . Hyperlipidemia   . Type 2 diabetes mellitus ([place])   . Obesity   . Laboratory test   . Nephrolithiasis   . Asthma   . Endometriosis   . Hypertension   . Iron deficiency anemia 05/11/2009  . GERD 05/11/2009    6:04 PM,11/04/15 Elly Modena PT, DPT [facility] [address] [place], [place], [address] Phone: [phone]   Fax:  [phone]  Name: Carrie Cook MRN: [id number] Date of Birth: [birth year]

## 2015-11-07 IMAGING — RF DG UGI W/ HIGH DENSITY W/KUB
19 of 24 series · 19 of 24 positions shown · non-contrast
Comparison: 11/20/2012 upper GI

FLUOROSCOPY TIME:  3 min and 42 seconds

CLINICAL DATA: Status post gastric bypass surgery in [REDACTED].
Persistent dysphagia. Nausea and vomiting. Morbid obesity.

EXAM:
UPPER GI SERIES W/HIGH DENSITY W/KUB
TECHNIQUE: After obtaining a scout radiograph a routine upper GI series was
performed using thin and high density barium.

[Series 1: run · 1 of 1 slices shown (1 of 19)]
[im 1/1]
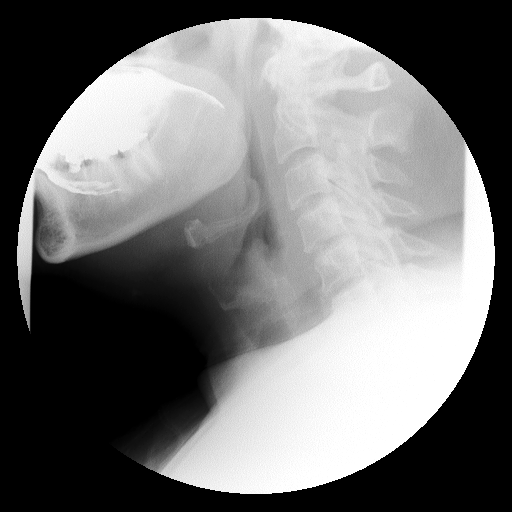

[Series 2: run · 1 of 5 slices shown (2 of 19)]
[im 1/5]
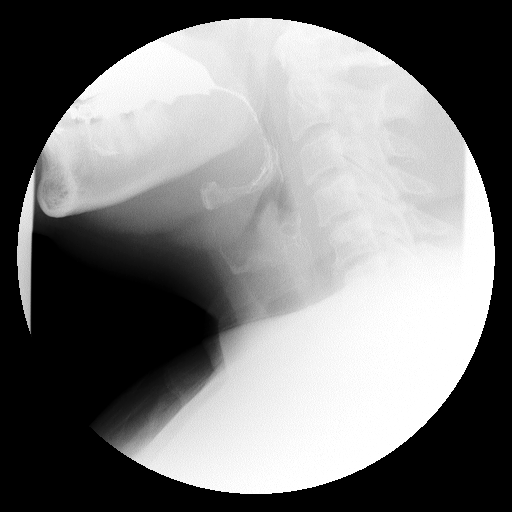

[Series 4: run · 1 of 1 slices shown (3 of 19)]
[im 1/1]
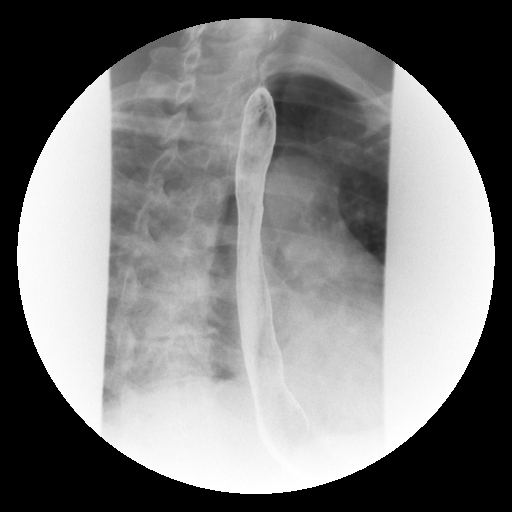

[Series 5: run · 1 of 1 slices shown (4 of 19)]
[im 1/1]
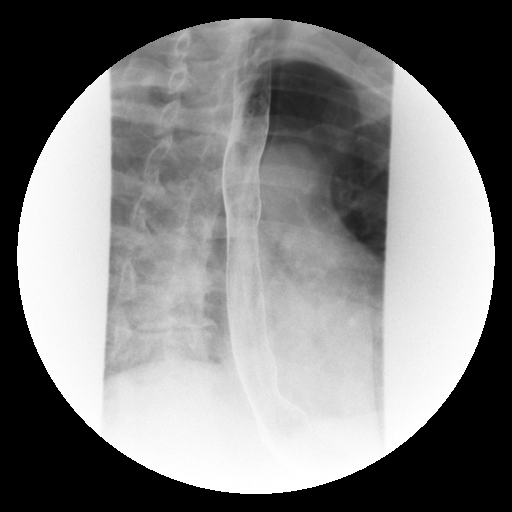

[Series 6: run · 1 of 1 slices shown (5 of 19)]
[im 1/1]
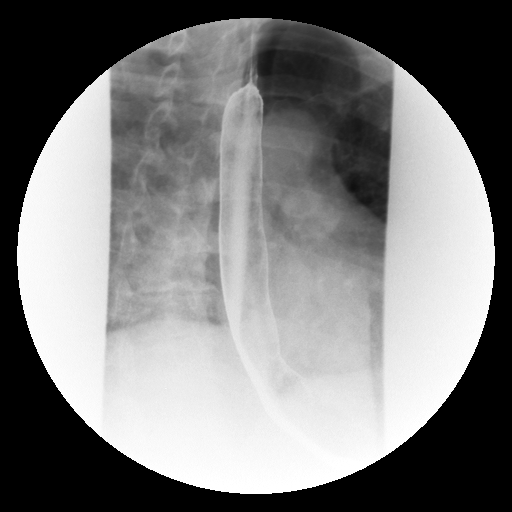

[Series 7: run · 1 of 1 slices shown (6 of 19)]
[im 1/1]
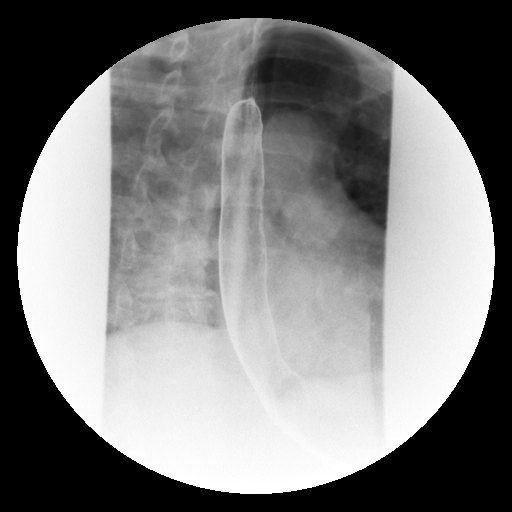

[Series 9: run · 1 of 1 slices shown (7 of 19)]
[im 1/1]
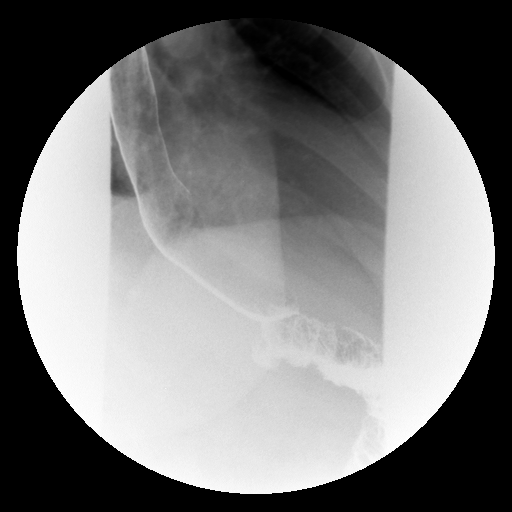

[Series 10: run · 1 of 1 slices shown (8 of 19)]
[im 1/1]
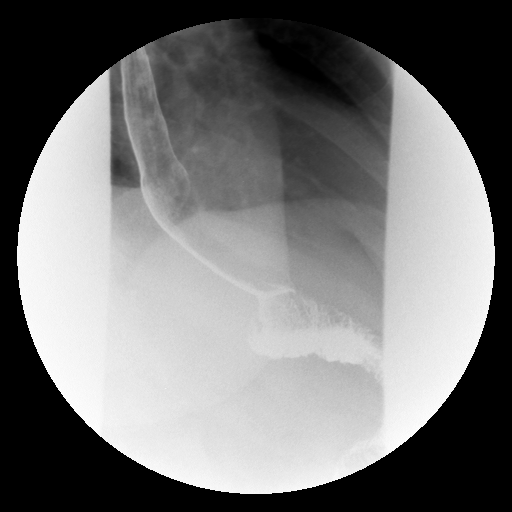

[Series 11: run · 1 of 1 slices shown (9 of 19)]
[im 1/1]
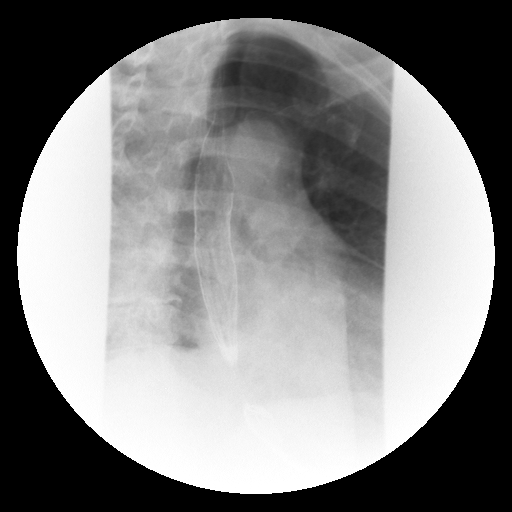

[Series 13: run · 1 of 3 slices shown (10 of 19)]
[im 1/3]
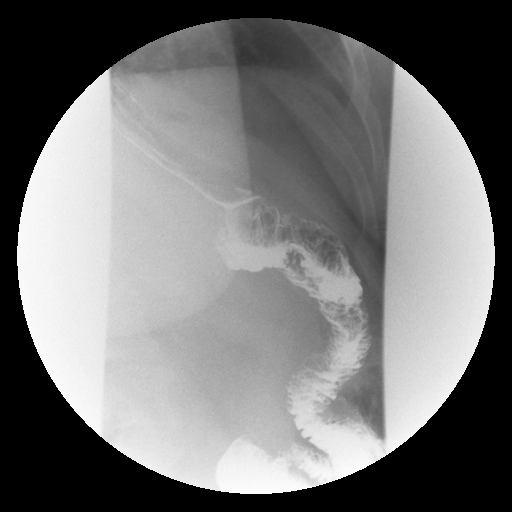

[Series 14: run · 1 of 1 slices shown (11 of 19)]
[im 1/1]
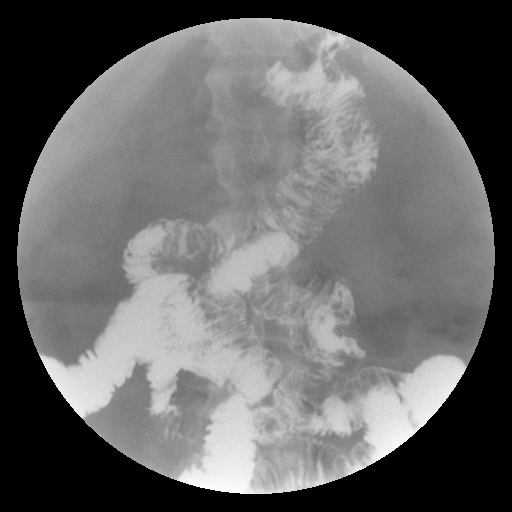

[Series 15: run · 1 of 1 slices shown (12 of 19)]
[im 1/1]
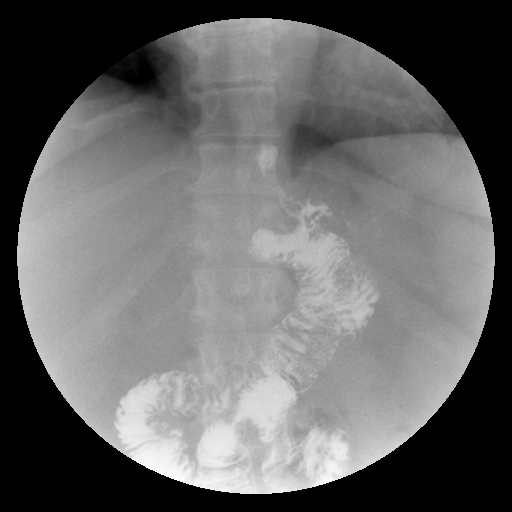

[Series 16: run · 1 of 1 slices shown (13 of 19)]
[im 1/1]
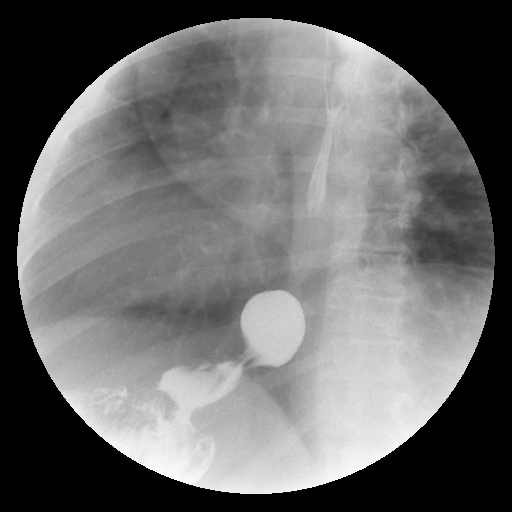

[Series 18: run · 1 of 1 slices shown (14 of 19)]
[im 1/1]
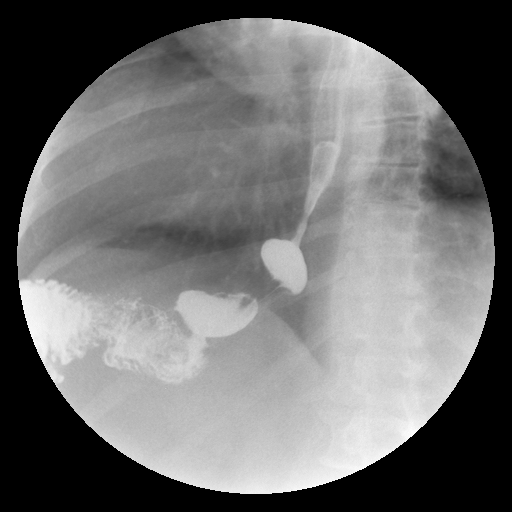

[Series 19: run · 1 of 1 slices shown (15 of 19)]
[im 1/1]
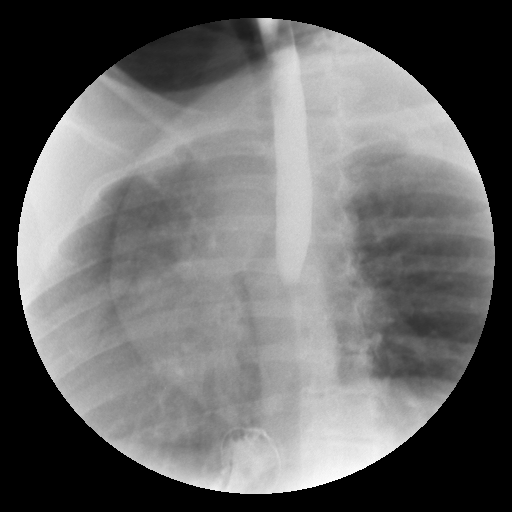

[Series 20: run · 1 of 1 slices shown (16 of 19)]
[im 1/1]
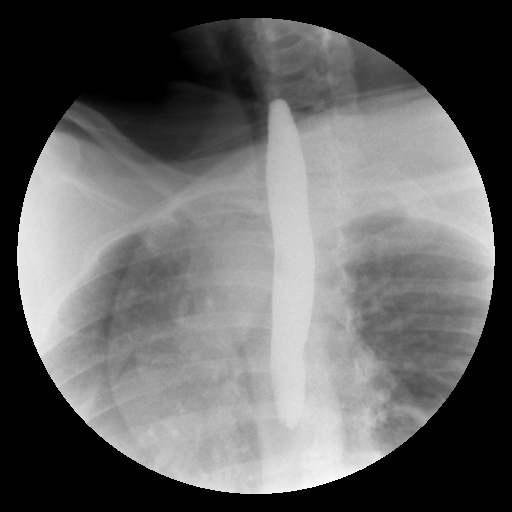

[Series 21: run · 1 of 1 slices shown (17 of 19)]
[im 1/1]
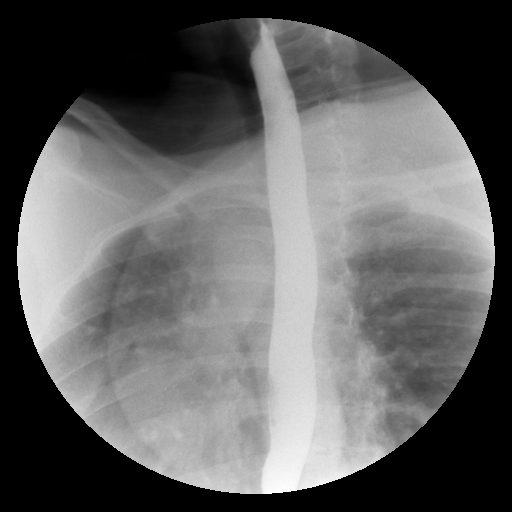

[Series 23: run · 1 of 1 slices shown (18 of 19)]
[im 1/1]
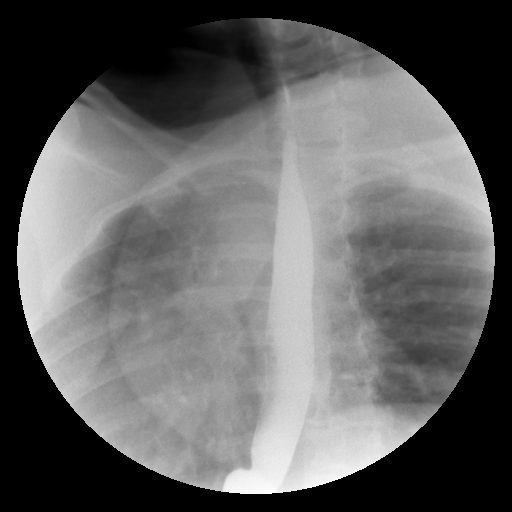

[Series 24: run · 1 of 1 slices shown (19 of 19)]
[im 1/1]
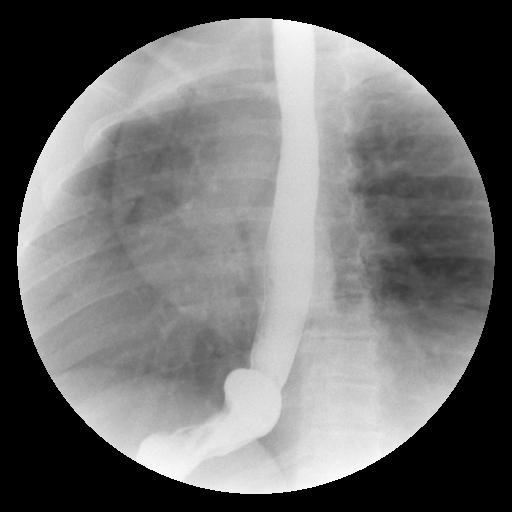

[19 of 24 positions shown; findings below may reference images not displayed]

FINDINGS: Hypo pharyngeal portion of the exam is unremarkable.

Double contrast evaluation of the esophagus demonstrates no mucosal
abnormality.

Evaluation of primary peristalsis demonstrates an incomplete primary
peristaltic wave with contrast stasis in the lower thoracic
esophagus. Full column evaluation of the esophagus demonstrates no
persistent narrowing or stricture.

The gastric remnant is normal in size. There is no evidence of
obstruction or ulcer at the gastric jejunal anastomosis.

The Roux loop is normal in caliber. Proximal small bowel loops are
normal in caliber, without obstruction at the jejunal/jejunal
anastomosis.

For preprocedure scout film is unremarkable.
IMPRESSION: 1. Expected appearance after gastric bypass, without anatomic cause
for patient's symptoms.
2. Mild esophageal dysmotility, likely early presbyesophagus.

## 2015-11-08 ENCOUNTER — Telehealth (HOSPITAL_COMMUNITY): Payer: Self-pay

## 2015-11-08 NOTE — Telephone Encounter (Signed)
11/08/15 left her a message that we had some available appts this week since she was on our wait list.

## 2015-11-15 ENCOUNTER — Ambulatory Visit (HOSPITAL_COMMUNITY): Payer: Medicare Other

## 2015-11-15 DIAGNOSIS — M25661 Stiffness of right knee, not elsewhere classified: Secondary | ICD-10-CM

## 2015-11-15 DIAGNOSIS — R2681 Unsteadiness on feet: Secondary | ICD-10-CM | POA: Diagnosis not present

## 2015-11-15 DIAGNOSIS — M25561 Pain in right knee: Secondary | ICD-10-CM | POA: Diagnosis not present

## 2015-11-15 DIAGNOSIS — R2689 Other abnormalities of gait and mobility: Secondary | ICD-10-CM | POA: Diagnosis not present

## 2015-11-15 DIAGNOSIS — R600 Localized edema: Secondary | ICD-10-CM | POA: Diagnosis not present

## 2015-11-15 NOTE — Therapy (Signed)
[facility] [place], [place], [address] Phone: [phone]   Fax:  [phone]  Physical Therapy Treatment  Patient Details  Name: Carrie Cook MRN: [id number] Date of Birth: 12-27-1960 Referring Provider: Arther Abbott, MD  Encounter Date: 11/15/2015      PT End of Session - 11/15/15 1423    Visit Number 2   Number of Visits 12   Date for PT Re-Evaluation 11/25/15   Authorization Type Medicare A & B   Authorization Time Period 11/04/15 to 12/16/15   Authorization - Visit Number 2   Authorization - Number of Visits 10   PT Start Time [id number]   PT Stop Time 1432   PT Time Calculation (min) 38 min   Equipment Utilized During Treatment Gait belt   Activity Tolerance Patient tolerated treatment well;No increased pain   Behavior During Therapy WFL for tasks assessed/performed      Past Medical History:  Diagnosis Date  . Anemia    PT HAS HAD COLONOSCOPY AND ENDOSCOPY WORK UP - NO PROBLEMS FOUND AS SOURCE OF ANEMIA -- PT HAD IRON INFUSION AT [facility] 11/13/12-AFTER SEEING HEMATOLOGIST DR. McArthur AND HE GAVE HEMATOLOGIC CLEARANCE FOR GASTRIC BYPASS SURGERY.  Marland Kitchen Anxiety   . Asthma    daily and prn inhalers  . Degenerative joint disease    right knee, spine - STATES INTERMITTENT  NUMBNESS DOWN RT LEG WITH PROLONGED STANDING OR WALKING- THINKS RELATED TO HER SPINE PROBLEMS  . Dental crowns present   . Depression   . Family history of anesthesia complication    pt's mother and sister have hx. of post-op N/V  . Fibroids    UTERINE  . Gastroesophageal reflux disease    none for 2 years since Bariatric surgery.  . H/O hiatal hernia   . History of endometriosis   . Hyperlipidemia    no current med.  . Hypertension    under control with med., has been on med. x 2 yr.  . Insulin dependent diabetes mellitus ([place])   . Lock jaw    jaw locks open if opens mouth wide  . Migraines   . Neuropathy ([place])    FEET  .  Obesity   . Palpitations   . PONV (postoperative nausea and vomiting)   . Shortness of breath    with daily activities  . Sleep apnea    hasnt had to use CPAP in 2 years since she lost weight    Past Surgical History:  Procedure Laterality Date  . BREATH TEK H PYLORI N/A 08/13/2012   Procedure: BREATH TEK H PYLORI;  Surgeon: Edward Jolly, MD;  Location: [facility];  Service: General;  Laterality: N/A;  . CARPAL TUNNEL RELEASE  01/03/2012   Procedure: CARPAL TUNNEL RELEASE;  Surgeon: Wynonia Sours, MD;  Location: [facility] [facility];  Service: Orthopedics;  Laterality: Right;  . carpal tunnel release right hand Right 12/13  . COLONOSCOPY  05/2009   [id number] hemorrhoids otherwise normal colon, rectum and terminal ileum  . COLONOSCOPY WITH ESOPHAGOGASTRODUODENOSCOPY (EGD) N/A 10/28/2012   Procedure: COLONOSCOPY WITH ESOPHAGOGASTRODUODENOSCOPY (EGD);  Surgeon: Daneil Dolin, MD;  [facility];  Service: Endoscopy;  Laterality: N/A;  7:30  . DILITATION & CURRETTAGE/HYSTROSCOPY WITH NOVASURE ABLATION N/A 07/22/2014   Procedure: DILATATION & CURETTAGE/HYSTEROSCOPY WITH NOVASURE ABLATION; uterine length 6.0 cm; uterine width 3.8 cm; total ablation time 1 minute 15 seconds;  Surgeon: Florian Buff, MD;  [facility];  Service: Gynecology;  Laterality: N/A;  . ESOPHAGOGASTRODUODENOSCOPY  5/013/2011   [id number] esophagus/multiple polyps removed s/p (hyperplastic). Gastritis without H.pylori.  Marland Kitchen GASTRIC ROUX-EN-Y N/A 11/19/2012   Procedure: LAPAROSCOPIC ROUX-EN-Y GASTRIC;  Surgeon: Edward Jolly, MD;  [facility];  Service: General;  Laterality: N/A;  . GIVENS CAPSULE STUDY N/A 10/28/2012   Procedure: GIVENS CAPSULE STUDY;  Surgeon: Daneil Dolin, MD;  [facility];  Service: Endoscopy;  Laterality: N/A;  . KNEE ARTHROSCOPY WITH MEDIAL MENISECTOMY Right 10/28/2015   Procedure: RIGHT KNEE ARTHROSCOPY WITH MEDIAL MENISECTOMY;  Surgeon: Carole Civil, MD;  [facility];  Service: Orthopedics;  Laterality: Right;  . LAPAROSCOPY     due to endometriosis  . PELVIC LAPAROSCOPY  11/04/1999   with fulguration of endometriosis  . TRIGGER FINGER RELEASE Right 09/24/2012   Procedure: RELEASE A-1 PULLEY OF RIGHT THUMB;  Surgeon: Wynonia Sours, MD;  Location: [facility];  Service: Orthopedics;  Laterality: Right;  . TUBAL LIGATION  1993    There were no vitals filed for this visit.      Subjective Assessment - 11/15/15 1357    Subjective Pt reports she is doing well today. She reports that she has been remaining active and the knee has felt somewhat better, but remains most painful with heel to toe walking and high levels of activity. HEP is performed intermittently.    Pertinent History Asthma, anxiety, HTN, DM, Rt knee scope 10/28/15   Currently in Pain? Yes   Pain Score 6    Pain Location Knee   Pain Orientation Right                         [facility] Adult PT Treatment/Exercise - 11/15/15 0001      Exercises   Exercises Knee/Hip     Knee/Hip Exercises: Seated   Long Arc Quad 1 set;Right;15 reps   Sit to [employer] 5 reps;2 sets;without UE support     Knee/Hip Exercises: Supine   Quad Sets 10 reps;AROM;Strengthening;Right  10x10sec hold   Heel Slides 10 reps  10x10sec   Bridges 1 set;10 reps                  PT Short Term Goals - 11/04/15 1538      PT SHORT TERM GOAL #1   Title Pt will demo consistency and independence with HEP to improve strength and ROM.    Time 2   Period Weeks   Status New     PT SHORT TERM GOAL #2   Title Pt will demo correct sit to stand technique without noted weight shift and without cues from therapist x5 reps.    Time 2   Period Weeks   Status New     PT SHORT TERM GOAL #3   Title Pt will demo correct heel toe sequencing without AD for atleast 200 ft without cues from therapist to decrease her risk of injury from compensation.    Time 4   Period  Weeks   Status New           PT Long Term Goals - 11/04/15 1543      PT LONG TERM GOAL #1   Title Pt will demo improved BLE strength to 5/5 MMT to decrease risk of injury with fucntional activity.    Time 6   Period Weeks   Status New     PT LONG TERM GOAL #  2   Title Pt will demo improved functional strength evident by her ability to perform 5x sit to stand in less than 13 sec without UE and without significant weight shift    Time 6   Period Weeks   Status New     PT LONG TERM GOAL #3   Title Pt will demo improved endurance evident by atleast a 252ft improvement in 3MWT distance with LRAD   Time 6   Period Weeks   Status New     PT LONG TERM GOAL #4   Title Pt will demo improved Rt knee ROM from atleast 0-120 deg to improve mechanics with functional tasks.    Time 6   Period Weeks   Status New               Plan - 11/15/15 1424    Clinical Impression Statement Pt tolerating session well today, with decreased pain. Evaluation is reviewed and full, as well as HEP and treatment goals. HEP is performed well, with mild correction to form needed.  Education given on decreasing step length, increaseing turnover, adn correct performance of HEP regarding positions.    Rehab Potential Good   PT Frequency Other (comment)   PT Duration 6 weeks   PT Treatment/Interventions Cryotherapy;ADLs/Self Care Home Management;Moist Heat;Gait training;Stair training;Functional mobility training;Therapeutic activities;Therapeutic exercise;Patient/family education;Neuromuscular re-education;Balance training;Manual techniques;Passive range of motion   PT Next Visit Plan Repeat LAQ, bridge, and add to HEP if performed with correct form.    PT Home Exercise Plan knee flexion stretch 10x10sec, knee ext stretch x30 min, walking 10 min/day focus on proper technique, sit to stand x10    Consulted and Agree with Plan of Care Patient      Patient will benefit from skilled therapeutic intervention  in order to improve the following deficits and impairments:  Abnormal gait, Decreased activity tolerance, Decreased balance, Decreased endurance, Decreased mobility, Decreased skin integrity, Difficulty walking, Hypomobility, Increased muscle spasms, Impaired sensation, Improper body mechanics, Pain, Postural dysfunction, Decreased strength, Increased fascial restricitons, Impaired flexibility, Decreased scar mobility, Decreased range of motion, Increased edema  Visit Diagnosis: Acute pain of right knee  Stiffness of right knee, not elsewhere classified  Unsteadiness on feet  Other abnormalities of gait and mobility  Localized edema     Problem List Patient Active Problem List   Diagnosis Date Noted  . Derangement of posterior horn of medial meniscus of right knee   . Right knee pain 09/10/2015  . Iron deficiency anemia 10/16/2013  . Fibroids 08/21/2013  . Excessive or frequent menstruation 06/19/2013  . Morbid obesity ([place]) 07/12/2012  . Obstructive sleep apnea 06/21/2012  . Chest pain   . Hyperlipidemia   . Type 2 diabetes mellitus ([place])   . Obesity   . Laboratory test   . Nephrolithiasis   . Asthma   . Endometriosis   . Hypertension   . Iron deficiency anemia 05/11/2009  . GERD 05/11/2009    2:36 PM, 11/15/15 Etta Grandchild, PT, DPT Physical Therapist at [facility] [phone] (office)     [facility] [address] [place], [place], [address] Phone: [phone]   Fax:  [phone]  Name: ROSALIA WESTOVER MRN: [id number] Date of Birth: January 13, 1961

## 2015-11-17 ENCOUNTER — Ambulatory Visit (HOSPITAL_COMMUNITY): Payer: Medicare Other

## 2015-11-18 ENCOUNTER — Ambulatory Visit (HOSPITAL_COMMUNITY): Payer: Medicare Other

## 2015-11-22 ENCOUNTER — Encounter: Payer: Self-pay | Admitting: Orthopedic Surgery

## 2015-11-22 ENCOUNTER — Ambulatory Visit (HOSPITAL_COMMUNITY): Payer: Medicare Other | Admitting: Physical Therapy

## 2015-11-22 ENCOUNTER — Ambulatory Visit (INDEPENDENT_AMBULATORY_CARE_PROVIDER_SITE_OTHER): Payer: Medicare Other | Admitting: Orthopedic Surgery

## 2015-11-22 DIAGNOSIS — Z9889 Other specified postprocedural states: Secondary | ICD-10-CM

## 2015-11-22 DIAGNOSIS — M25561 Pain in right knee: Secondary | ICD-10-CM | POA: Diagnosis not present

## 2015-11-22 DIAGNOSIS — M25661 Stiffness of right knee, not elsewhere classified: Secondary | ICD-10-CM

## 2015-11-22 DIAGNOSIS — R2681 Unsteadiness on feet: Secondary | ICD-10-CM | POA: Diagnosis not present

## 2015-11-22 DIAGNOSIS — Z4889 Encounter for other specified surgical aftercare: Secondary | ICD-10-CM

## 2015-11-22 DIAGNOSIS — R600 Localized edema: Secondary | ICD-10-CM

## 2015-11-22 DIAGNOSIS — R2689 Other abnormalities of gait and mobility: Secondary | ICD-10-CM

## 2015-11-22 NOTE — Therapy (Signed)
[facility] [place], [place], [address] Phone: [phone]   Fax:  [phone]  Physical Therapy Treatment  Patient Details  Name: Carrie Cook MRN: [id number] Date of Birth: 16-Feb-1960 Referring Provider: Arther Abbott, MD  Encounter Date: 11/22/2015      PT End of Session - 11/22/15 1337    Visit Number 3   Number of Visits 12   Date for PT Re-Evaluation 11/25/15   Authorization Type Medicare A & B   Authorization Time Period 11/04/15 to 12/16/15   Authorization - Visit Number 3   Authorization - Number of Visits 10   PT Start Time [id number]   PT Stop Time 1343   PT Time Calculation (min) 38 min   Equipment Utilized During Treatment Gait belt   Activity Tolerance Patient tolerated treatment well;No increased pain   Behavior During Therapy WFL for tasks assessed/performed      Past Medical History:  Diagnosis Date  . Anemia    PT HAS HAD COLONOSCOPY AND ENDOSCOPY WORK UP - NO PROBLEMS FOUND AS SOURCE OF ANEMIA -- PT HAD IRON INFUSION AT [facility] 11/13/12-AFTER SEEING HEMATOLOGIST DR. Mary Esther AND HE GAVE HEMATOLOGIC CLEARANCE FOR GASTRIC BYPASS SURGERY.  Marland Kitchen Anxiety   . Asthma    daily and prn inhalers  . Degenerative joint disease    right knee, spine - STATES INTERMITTENT  NUMBNESS DOWN RT LEG WITH PROLONGED STANDING OR WALKING- THINKS RELATED TO HER SPINE PROBLEMS  . Dental crowns present   . Depression   . Family history of anesthesia complication    pt's mother and sister have hx. of post-op N/V  . Fibroids    UTERINE  . Gastroesophageal reflux disease    none for 2 years since Bariatric surgery.  . H/O hiatal hernia   . History of endometriosis   . Hyperlipidemia    no current med.  . Hypertension    under control with med., has been on med. x 2 yr.  . Insulin dependent diabetes mellitus ([place])   . Lock jaw    jaw locks open if opens mouth wide  . Migraines   . Neuropathy ([place])    FEET  .  Obesity   . Palpitations   . PONV (postoperative nausea and vomiting)   . Shortness of breath    with daily activities  . Sleep apnea    hasnt had to use CPAP in 2 years since she lost weight    Past Surgical History:  Procedure Laterality Date  . BREATH TEK H PYLORI N/A 08/13/2012   Procedure: BREATH TEK H PYLORI;  Surgeon: Edward Jolly, MD;  Location: [facility];  Service: General;  Laterality: N/A;  . CARPAL TUNNEL RELEASE  01/03/2012   Procedure: CARPAL TUNNEL RELEASE;  Surgeon: Wynonia Sours, MD;  Location: [facility];  Service: Orthopedics;  Laterality: Right;  . carpal tunnel release right hand Right 12/13  . COLONOSCOPY  05/2009   [id number] hemorrhoids otherwise normal colon, rectum and terminal ileum  . COLONOSCOPY WITH ESOPHAGOGASTRODUODENOSCOPY (EGD) N/A 10/28/2012   Procedure: COLONOSCOPY WITH ESOPHAGOGASTRODUODENOSCOPY (EGD);  Surgeon: Daneil Dolin, MD;  [facility];  Service: Endoscopy;  Laterality: N/A;  7:30  . DILITATION & CURRETTAGE/HYSTROSCOPY WITH NOVASURE ABLATION N/A 07/22/2014   Procedure: DILATATION & CURETTAGE/HYSTEROSCOPY WITH NOVASURE ABLATION; uterine length 6.0 cm; uterine width 3.8 cm; total ablation time 1 minute 15 seconds;  Surgeon: Florian Buff, MD;  [facility];  Service: Gynecology;  Laterality: N/A;  . ESOPHAGOGASTRODUODENOSCOPY  5/013/2011   [id number] esophagus/multiple polyps removed s/p (hyperplastic). Gastritis without H.pylori.  Marland Kitchen GASTRIC ROUX-EN-Y N/A 11/19/2012   Procedure: LAPAROSCOPIC ROUX-EN-Y GASTRIC;  Surgeon: Edward Jolly, MD;  [facility];  Service: General;  Laterality: N/A;  . GIVENS CAPSULE STUDY N/A 10/28/2012   Procedure: GIVENS CAPSULE STUDY;  Surgeon: Daneil Dolin, MD;  [facility];  Service: Endoscopy;  Laterality: N/A;  . KNEE ARTHROSCOPY WITH MEDIAL MENISECTOMY Right 10/28/2015   Procedure: RIGHT KNEE ARTHROSCOPY WITH MEDIAL MENISECTOMY;  Surgeon: Carole Civil, MD;  [facility];  Service: Orthopedics;  Laterality: Right;  . LAPAROSCOPY     due to endometriosis  . PELVIC LAPAROSCOPY  11/04/1999   with fulguration of endometriosis  . TRIGGER FINGER RELEASE Right 09/24/2012   Procedure: RELEASE A-1 PULLEY OF RIGHT THUMB;  Surgeon: Wynonia Sours, MD;  Location: [facility];  Service: Orthopedics;  Laterality: Right;  . TUBAL LIGATION  1993    There were no vitals filed for this visit.      Subjective Assessment - 11/22/15 1347    Subjective Pt reports overall improvement noted in the past week.  Pt reports most discomfort is with transitional movements.  Reports she always has pain.    Currently in Pain? Yes   Pain Score 5    Pain Location Knee   Pain Orientation Right   Pain Descriptors / Indicators Sore;Constant                         [facility] Adult PT Treatment/Exercise - 11/22/15 0001      Knee/Hip Exercises: Stretches   Active Hamstring Stretch Both;3 reps;20 seconds   Active Hamstring Stretch Limitations 12" box     Knee/Hip Exercises: Standing   Heel Raises 10 reps   Heel Raises Limitations toe raises 10 reps   Forward Lunges Both;15 reps   Forward Lunges Limitations 4" box   Functional Squat 10 reps     Knee/Hip Exercises: Seated   Long Arc Quad Right;15 reps   Sit to [employer] 5 reps;2 sets;without UE support     Knee/Hip Exercises: Supine   Heel Slides 15 reps   Bridges 15 reps   Straight Leg Raises Right;15 reps     Knee/Hip Exercises: Sidelying   Hip ABduction 15 reps;Right     Knee/Hip Exercises: Prone   Hamstring Curl 15 reps   Hip Extension 15 reps;Right                  PT Short Term Goals - 11/04/15 1538      PT SHORT TERM GOAL #1   Title Pt will demo consistency and independence with HEP to improve strength and ROM.    Time 2   Period Weeks   Status New     PT SHORT TERM GOAL #2   Title Pt will demo correct sit to stand technique without noted weight  shift and without cues from therapist x5 reps.    Time 2   Period Weeks   Status New     PT SHORT TERM GOAL #3   Title Pt will demo correct heel toe sequencing without AD for atleast 200 ft without cues from therapist to decrease her risk of injury from compensation.    Time 4   Period Weeks   Status New  PT Long Term Goals - 11/04/15 1543      PT LONG TERM GOAL #1   Title Pt will demo improved BLE strength to 5/5 MMT to decrease risk of injury with fucntional activity.    Time 6   Period Weeks   Status New     PT LONG TERM GOAL #2   Title Pt will demo improved functional strength evident by her ability to perform 5x sit to stand in less than 13 sec without UE and without significant weight shift    Time 6   Period Weeks   Status New     PT LONG TERM GOAL #3   Title Pt will demo improved endurance evident by atleast a 242ft improvement in 3MWT distance with LRAD   Time 6   Period Weeks   Status New     PT LONG TERM GOAL #4   Title Pt will demo improved Rt knee ROM from atleast 0-120 deg to improve mechanics with functional tasks.    Time 6   Period Weeks   Status New               Plan - 11/22/15 1341    Clinical Impression Statement Pt continues to progress well with therapy with overall improvments voiced since beginning.  currently with 140 degrees flexion in Lt knee (Rt knee is 150 degrees) and most pain/discomfort is felt with transitional movements, i.e bend to straight/straight to bend.  Pt is compliant with HEP and requires little cues for form.  Added standing lunges (on riser) shallow squats and stretches for hamstring.  Increased reps of all other therex this session.  No pain reported at end of session.    Rehab Potential Good   PT Frequency Other (comment)   PT Duration 6 weeks   PT Treatment/Interventions Cryotherapy;ADLs/Self Care Home Management;Moist Heat;Gait training;Stair training;Functional mobility training;Therapeutic  activities;Therapeutic exercise;Patient/family education;Neuromuscular re-education;Balance training;Manual techniques;Passive range of motion   PT Next Visit Plan continue to progress LE strength and pain free functioning.     PT Home Exercise Plan knee flexion stretch 10x10sec, knee ext stretch x30 min, walking 10 min/day focus on proper technique, sit to stand x10    Consulted and Agree with Plan of Care Patient      Patient will benefit from skilled therapeutic intervention in order to improve the following deficits and impairments:  Abnormal gait, Decreased activity tolerance, Decreased balance, Decreased endurance, Decreased mobility, Decreased skin integrity, Difficulty walking, Hypomobility, Increased muscle spasms, Impaired sensation, Improper body mechanics, Pain, Postural dysfunction, Decreased strength, Increased fascial restricitons, Impaired flexibility, Decreased scar mobility, Decreased range of motion, Increased edema  Visit Diagnosis: Acute pain of right knee  Stiffness of right knee, not elsewhere classified  Unsteadiness on feet  Other abnormalities of gait and mobility  Localized edema     Problem List Patient Active Problem List   Diagnosis Date Noted  . Derangement of posterior horn of medial meniscus of right knee   . Right knee pain 09/10/2015  . Iron deficiency anemia 10/16/2013  . Fibroids 08/21/2013  . Excessive or frequent menstruation 06/19/2013  . Morbid obesity ([place]) 07/12/2012  . Obstructive sleep apnea 06/21/2012  . Chest pain   . Hyperlipidemia   . Type 2 diabetes mellitus ([place])   . Obesity   . Laboratory test   . Nephrolithiasis   . Asthma   . Endometriosis   . Hypertension   . Iron deficiency anemia 05/11/2009  . GERD 05/11/2009   Amy  Sula Soda, PTA/CLT [phone]  11/22/2015, 1:49 PM  [facility] [address] [place], [place], [address] Phone: [phone]   Fax:   [phone]  Name: Carrie Cook MRN: [id number] Date of Birth: 01-20-1961

## 2015-11-22 NOTE — Progress Notes (Signed)
Patient ID: Carrie Cook, female   DOB: 08/31/1960, 55 y.o.   MRN: [id number]  Post op visit   Chief Complaint  Patient presents with  . Follow-up    SARK 10/28/15   Postop day 25  Improve overall but still having some mild discomfort in the right knee especially with turning and walking for long periods of time  Her therapy note today indicated the following      Subjective Pt reports overall improvement noted in the past week.  Pt reports most discomfort is with transitional movements.  Reports she always has pain.     Currently in Pain? 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 Torn medial meniscus primarily at the body.   Recommend follow-up in 2 months. At that time we can discuss further treatment options

## 2015-11-25 ENCOUNTER — Telehealth (HOSPITAL_COMMUNITY): Payer: Self-pay | Admitting: Family Medicine

## 2015-11-25 ENCOUNTER — Ambulatory Visit (HOSPITAL_COMMUNITY): Payer: Medicare Other | Admitting: Physical Therapy

## 2015-11-25 NOTE — Telephone Encounter (Signed)
11/25/15 she cx her appt today because she is running a fever and has an upset stomach

## 2015-11-29 ENCOUNTER — Ambulatory Visit (HOSPITAL_COMMUNITY): Payer: Medicare Other | Attending: Family Medicine | Admitting: Physical Therapy

## 2015-11-29 DIAGNOSIS — M25561 Pain in right knee: Secondary | ICD-10-CM

## 2015-11-29 DIAGNOSIS — R2681 Unsteadiness on feet: Secondary | ICD-10-CM | POA: Diagnosis not present

## 2015-11-29 DIAGNOSIS — F339 Major depressive disorder, recurrent, unspecified: Secondary | ICD-10-CM | POA: Diagnosis not present

## 2015-11-29 DIAGNOSIS — M25661 Stiffness of right knee, not elsewhere classified: Secondary | ICD-10-CM | POA: Diagnosis not present

## 2015-11-29 DIAGNOSIS — R2689 Other abnormalities of gait and mobility: Secondary | ICD-10-CM

## 2015-11-29 DIAGNOSIS — R600 Localized edema: Secondary | ICD-10-CM

## 2015-11-29 NOTE — Therapy (Signed)
Carrie Cook, [place], [address] Phone: [phone]   Fax:  [phone]  Physical Therapy Treatment  Patient Details  Name: Carrie Cook MRN: [id number] Date of Birth: [birth year] Referring Provider: Arther Abbott, MD  Encounter Date: 11/29/2015      PT End of Session - 11/29/15 1405    Visit Number 4   Number of Visits 12   Date for PT Re-Evaluation 11/25/15   Authorization Type Medicare A & B   Authorization Time Period 11/04/15 to 12/16/15   Authorization - Visit Number 4   Authorization - Number of Visits 10   PT Start Time [id number]   PT Stop Time 1428   PT Time Calculation (min) 42 min   Activity Tolerance Patient tolerated treatment well;No increased pain   Behavior During Therapy WFL for tasks assessed/performed      Past Medical History:  Diagnosis Date  . Anemia    PT HAS HAD COLONOSCOPY AND ENDOSCOPY WORK UP - NO PROBLEMS FOUND AS SOURCE OF ANEMIA -- PT HAD IRON INFUSION AT [facility] 11/13/12-AFTER SEEING HEMATOLOGIST DR. Deary AND HE GAVE HEMATOLOGIC CLEARANCE FOR GASTRIC BYPASS SURGERY.  Marland Kitchen Anxiety   . Asthma    daily and prn inhalers  . Degenerative joint disease    right knee, spine - STATES INTERMITTENT  NUMBNESS DOWN RT LEG WITH PROLONGED STANDING OR WALKING- THINKS RELATED TO HER SPINE PROBLEMS  . Dental crowns present   . Depression   . Family history of anesthesia complication    pt's mother and sister have hx. of post-op N/V  . Fibroids    UTERINE  . Gastroesophageal reflux disease    none for 2 years since Bariatric surgery.  . H/O hiatal hernia   . History of endometriosis   . Hyperlipidemia    no current med.  . Hypertension    under control with med., has been on med. x 2 yr.  . Insulin dependent diabetes mellitus ([place])   . Lock jaw    jaw locks open if opens mouth wide  . Migraines   . Neuropathy ([place])    FEET  . Obesity   . Palpitations   . PONV (postoperative  nausea and vomiting)   . Shortness of breath    with daily activities  . Sleep apnea    hasnt had to use CPAP in 2 years since she lost weight    Past Surgical History:  Procedure Laterality Date  . BREATH TEK H PYLORI N/A 08/13/2012   Procedure: BREATH TEK H PYLORI;  Surgeon: Edward Jolly, MD;  Location: [facility];  Service: General;  Laterality: N/A;  . CARPAL TUNNEL RELEASE  01/03/2012   Procedure: CARPAL TUNNEL RELEASE;  Surgeon: Wynonia Sours, MD;  Location: [facility];  Service: Orthopedics;  Laterality: Right;  . carpal tunnel release right hand Right 12/13  . COLONOSCOPY  05/2009   [id number] hemorrhoids otherwise normal colon, rectum and terminal ileum  . COLONOSCOPY WITH ESOPHAGOGASTRODUODENOSCOPY (EGD) N/A 10/28/2012   Procedure: COLONOSCOPY WITH ESOPHAGOGASTRODUODENOSCOPY (EGD);  Surgeon: Daneil Dolin, MD;  [facility];  Service: Endoscopy;  Laterality: N/A;  7:30  . DILITATION & CURRETTAGE/HYSTROSCOPY WITH NOVASURE ABLATION N/A 07/22/2014   Procedure: DILATATION & CURETTAGE/HYSTEROSCOPY WITH NOVASURE ABLATION; uterine length 6.0 cm; uterine width 3.8 cm; total ablation time 1 minute 15 seconds;  Surgeon: Florian Buff, MD;  [facility];  Service: Gynecology;  Laterality: N/A;  .  ESOPHAGOGASTRODUODENOSCOPY  5/013/2011   [id number] esophagus/multiple polyps removed s/p (hyperplastic). Gastritis without H.pylori.  Marland Kitchen GASTRIC ROUX-EN-Y N/A 11/19/2012   Procedure: LAPAROSCOPIC ROUX-EN-Y GASTRIC;  Surgeon: Edward Jolly, MD;  [facility]: [facility];  Service: General;  Laterality: N/A;  . GIVENS CAPSULE STUDY N/A 10/28/2012   Procedure: GIVENS CAPSULE STUDY;  Surgeon: Daneil Dolin, MD;  [facility];  Service: Endoscopy;  Laterality: N/A;  . KNEE ARTHROSCOPY WITH MEDIAL MENISECTOMY Right 10/28/2015   Procedure: RIGHT KNEE ARTHROSCOPY WITH MEDIAL MENISECTOMY;  Surgeon: Carole Civil, MD;  [facility];  Service: Orthopedics;   Laterality: Right;  . LAPAROSCOPY     due to endometriosis  . PELVIC LAPAROSCOPY  11/04/1999   with fulguration of endometriosis  . TRIGGER FINGER RELEASE Right 09/24/2012   Procedure: RELEASE A-1 PULLEY OF RIGHT THUMB;  Surgeon: Wynonia Sours, MD;  Location: [facility];  Service: Orthopedics;  Laterality: Right;  . TUBAL LIGATION  1993    There were no vitals filed for this visit.      Subjective Assessment - 11/29/15 1348    Subjective Pt reports that things are going well with continued improvement. She noticed some pain sitting and waiting for her doctor's appointment and she did some exercises to improve her pain.    Currently in Pain? Yes   Pain Score 2    Pain Location Knee   Pain Orientation Right   Pain Descriptors / Indicators Sore   Pain Type Surgical pain   Pain Radiating Towards none    Pain Onset 1 to 4 weeks ago   Aggravating Factors  on her feet or twisting her knee    Pain Relieving Factors exercises                          [facility] Adult PT Treatment/Exercise - 11/29/15 0001      Knee/Hip Exercises: Standing   Forward Step Up 2 sets;Step Height: 4";Step Height: 6";Hand Hold: 2;Right   Forward Step Up Limitations decreased to 4" step    Step Down 2 sets;Hand Hold: 1;Step Height: 2";15 reps   Step Down Limitations lateral    Wall Squat 2 sets;10 reps   Other Standing Knee Exercises hamstring curls RLE 2x15, 5#     Knee/Hip Exercises: Supine   Bridges 2 sets;15 reps                PT Education - 11/29/15 1403    Education provided Yes   Education Details discussed actvities to promote endurance/strength without increased stress on knee; technique with therex   Person(s) Educated Patient   Methods Explanation;Demonstration   Comprehension Verbalized understanding;Returned demonstration          PT Short Term Goals - 11/04/15 1538      PT SHORT TERM GOAL #1   Title Pt will demo consistency and independence with  HEP to improve strength and ROM.    Time 2   Period Weeks   Status New     PT SHORT TERM GOAL #2   Title Pt will demo correct sit to stand technique without noted weight shift and without cues from therapist x5 reps.    Time 2   Period Weeks   Status New     PT SHORT TERM GOAL #3   Title Pt will demo correct heel toe sequencing without AD for atleast 200 ft without cues from therapist to decrease her risk of  injury from compensation.    Time 4   Period Weeks   Status New           PT Long Term Goals - 11/04/15 1543      PT LONG TERM GOAL #1   Title Pt will demo improved BLE strength to 5/5 MMT to decrease risk of injury with fucntional activity.    Time 6   Period Weeks   Status New     PT LONG TERM GOAL #2   Title Pt will demo improved functional strength evident by her ability to perform 5x sit to stand in less than 13 sec without UE and without significant weight shift    Time 6   Period Weeks   Status New     PT LONG TERM GOAL #3   Title Pt will demo improved endurance evident by atleast a 267ft improvement in 3MWT distance with LRAD   Time 6   Period Weeks   Status New     PT LONG TERM GOAL #4   Title Pt will demo improved Rt knee ROM from atleast 0-120 deg to improve mechanics with functional tasks.    Time 6   Period Weeks   Status New               Plan - 11/29/15 1525    Clinical Impression Statement Today's session we continued with therex progressions in closed chain positions. Pt able to tolerate functional strengthening with increased pain during 6" step ups. Decreased step height with improvement in pain in her Rt knee. Addressed pt concerns for therex that will avoid increased strain on her knee, and pt verbalized understanding.    Rehab Potential Good   PT Frequency Other (comment)   PT Duration 6 weeks   PT Treatment/Interventions Cryotherapy;ADLs/Self Care Home Management;Moist Heat;Gait training;Stair training;Functional mobility  training;Therapeutic activities;Therapeutic exercise;Patient/family education;Neuromuscular re-education;Balance training;Manual techniques;Passive range of motion   PT Next Visit Plan continue to progress LE strength and pain free functioning.     PT Home Exercise Plan knee flexion stretch 10x10sec, knee ext stretch x30 min, walking 10 min/day focus on proper technique, sit to stand x10    Consulted and Agree with Plan of Care Patient      Patient will benefit from skilled therapeutic intervention in order to improve the following deficits and impairments:  Abnormal gait, Decreased activity tolerance, Decreased balance, Decreased endurance, Decreased mobility, Decreased skin integrity, Difficulty walking, Hypomobility, Increased muscle spasms, Impaired sensation, Improper body mechanics, Pain, Postural dysfunction, Decreased strength, Increased fascial restricitons, Impaired flexibility, Decreased scar mobility, Decreased range of motion, Increased edema  Visit Diagnosis: Acute pain of right knee  Stiffness of right knee, not elsewhere classified  Unsteadiness on feet  Other abnormalities of gait and mobility  Localized edema     Problem List Patient Active Problem List   Diagnosis Date Noted  . Derangement of posterior horn of medial meniscus of right knee   . Right knee pain 09/10/2015  . Iron deficiency anemia 10/16/2013  . Fibroids 08/21/2013  . Excessive or frequent menstruation 06/19/2013  . Morbid obesity ([place]) 07/12/2012  . Obstructive sleep apnea 06/21/2012  . Chest pain   . Hyperlipidemia   . Type 2 diabetes mellitus ([place])   . Obesity   . Laboratory test   . Nephrolithiasis   . Asthma   . Endometriosis   . Hypertension   . Iron deficiency anemia 05/11/2009  . GERD 05/11/2009    3:34 PM,11/29/15 Elly Modena  PT, DPT [facility] [address] [place], [place],  [address] Phone: [phone]   Fax:  [phone]  Name: JONIQUA LUKINS MRN: [id number] Date of Birth: 1961-01-17

## 2015-12-02 ENCOUNTER — Telehealth (HOSPITAL_COMMUNITY): Payer: Self-pay | Admitting: Family Medicine

## 2015-12-02 ENCOUNTER — Telehealth (HOSPITAL_COMMUNITY): Payer: Self-pay | Admitting: Physical Therapy

## 2015-12-02 ENCOUNTER — Ambulatory Visit (HOSPITAL_COMMUNITY): Payer: Medicare Other | Admitting: Physical Therapy

## 2015-12-02 NOTE — Telephone Encounter (Signed)
12/02/15 cx today - said she couldn't be here

## 2015-12-02 NOTE — Telephone Encounter (Signed)
Pt agreed to move from 1pm to 4pm, pt states this works best for her also. NF 12/02/15

## 2015-12-06 ENCOUNTER — Ambulatory Visit (HOSPITAL_COMMUNITY): Payer: Medicare Other | Admitting: Physical Therapy

## 2015-12-06 DIAGNOSIS — R2681 Unsteadiness on feet: Secondary | ICD-10-CM

## 2015-12-06 DIAGNOSIS — R600 Localized edema: Secondary | ICD-10-CM | POA: Diagnosis not present

## 2015-12-06 DIAGNOSIS — R2689 Other abnormalities of gait and mobility: Secondary | ICD-10-CM

## 2015-12-06 DIAGNOSIS — M25661 Stiffness of right knee, not elsewhere classified: Secondary | ICD-10-CM | POA: Diagnosis not present

## 2015-12-06 DIAGNOSIS — M25561 Pain in right knee: Secondary | ICD-10-CM | POA: Diagnosis not present

## 2015-12-06 NOTE — Therapy (Signed)
[place], [place], [address] Phone: [phone]   Fax:  [phone]  Physical Therapy Treatment  Patient Details  Name: Carrie Cook MRN: [id number] Date of Birth: Mar 07, 1960 Referring Provider: Arther Abbott, MD  Encounter Date: 12/06/2015      PT End of Session - 12/06/15 1342    Visit Number 5   Number of Visits 12   Date for PT Re-Evaluation 11/25/15   Authorization Type Medicare A & B   Authorization Time Period 11/04/15 to 12/16/15   Authorization - Visit Number 5   Authorization - Number of Visits 10   PT Start Time 8325   PT Stop Time 1342   PT Time Calculation (min) 44 min   Activity Tolerance Patient tolerated treatment well;No increased pain   Behavior During Therapy WFL for tasks assessed/performed      Past Medical History:  Diagnosis Date  . Anemia    PT HAS HAD COLONOSCOPY AND ENDOSCOPY WORK UP - NO PROBLEMS FOUND AS SOURCE OF ANEMIA -- PT HAD IRON INFUSION AT [facility] 11/13/12-AFTER SEEING HEMATOLOGIST DR. [place] AND HE GAVE HEMATOLOGIC CLEARANCE FOR GASTRIC BYPASS SURGERY.  Marland Kitchen Anxiety   . Asthma    daily and prn inhalers  . Degenerative joint disease    right knee, spine - STATES INTERMITTENT  NUMBNESS DOWN RT LEG WITH PROLONGED STANDING OR WALKING- THINKS RELATED TO HER SPINE PROBLEMS  . Dental crowns present   . Depression   . Family history of anesthesia complication    pt's mother and sister have hx. of post-op N/V  . Fibroids    UTERINE  . Gastroesophageal reflux disease    none for 2 years since Bariatric surgery.  . H/O hiatal hernia   . History of endometriosis   . Hyperlipidemia    no current med.  . Hypertension    under control with med., has been on med. x 2 yr.  . Insulin dependent diabetes mellitus ([place])   . Lock jaw    jaw locks open if opens mouth wide  . Migraines   . Neuropathy ([place])    FEET  . Obesity   . Palpitations   . PONV (postoperative  nausea and vomiting)   . Shortness of breath    with daily activities  . Sleep apnea    hasnt had to use CPAP in 2 years since she lost weight    Past Surgical History:  Procedure Laterality Date  . BREATH TEK H PYLORI N/A 08/13/2012   Procedure: BREATH TEK H PYLORI;  Surgeon: Edward Jolly, MD;  Location: [facility];  Service: General;  Laterality: N/A;  . CARPAL TUNNEL RELEASE  01/03/2012   Procedure: CARPAL TUNNEL RELEASE;  Surgeon: Wynonia Sours, MD;  Location: [facility];  Service: Orthopedics;  Laterality: Right;  . carpal tunnel release right hand Right 12/13  . COLONOSCOPY  05/2009   [id number] hemorrhoids otherwise normal colon, rectum and terminal ileum  . COLONOSCOPY WITH ESOPHAGOGASTRODUODENOSCOPY (EGD) N/A 10/28/2012   Procedure: COLONOSCOPY WITH ESOPHAGOGASTRODUODENOSCOPY (EGD);  Surgeon: Daneil Dolin, MD;  [facility];  Service: Endoscopy;  Laterality: N/A;  7:30  . DILITATION & CURRETTAGE/HYSTROSCOPY WITH NOVASURE ABLATION N/A 07/22/2014   Procedure: DILATATION & CURETTAGE/HYSTEROSCOPY WITH NOVASURE ABLATION; uterine length 6.0 cm; uterine width 3.8 cm; total ablation time 1 minute 15 seconds;  Surgeon: Florian Buff, MD;  [facility];  Service: Gynecology;  Laterality: N/A;  .  ESOPHAGOGASTRODUODENOSCOPY  5/013/2011   WHQ:PRFFMB esophagus/multiple polyps removed s/p (hyperplastic). Gastritis without H.pylori.  Marland Kitchen GASTRIC ROUX-EN-Y N/A 11/19/2012   Procedure: LAPAROSCOPIC ROUX-EN-Y GASTRIC;  Surgeon: Edward Jolly, MD;  [facility];  Service: General;  Laterality: N/A;  . GIVENS CAPSULE STUDY N/A 10/28/2012   Procedure: GIVENS CAPSULE STUDY;  Surgeon: Daneil Dolin, MD;  [facility];  Service: Endoscopy;  Laterality: N/A;  . KNEE ARTHROSCOPY WITH MEDIAL MENISECTOMY Right 10/28/2015   Procedure: RIGHT KNEE ARTHROSCOPY WITH MEDIAL MENISECTOMY;  Surgeon: Carole Civil, MD;  [facility];  Service: Orthopedics;   Laterality: Right;  . LAPAROSCOPY     due to endometriosis  . PELVIC LAPAROSCOPY  11/04/1999   with fulguration of endometriosis  . TRIGGER FINGER RELEASE Right 09/24/2012   Procedure: RELEASE A-1 PULLEY OF RIGHT THUMB;  Surgeon: Wynonia Sours, MD;  Location: [facility];  Service: Orthopedics;  Laterality: Right;  . TUBAL LIGATION  1993    There were no vitals filed for this visit.      Subjective Assessment - 12/06/15 1258    Subjective Pt states that her knee is getting better.    Pertinent History Asthma, anxiety, HTN, DM, Rt knee scope 10/28/15   Limitations Walking;Other (comment)  sleeping, unable to sleep on her side   How long can you sit comfortably? 10 minutes was 20 minutes    How long can you stand comfortably? able to for 15 minutes was less than 1 minute   How long can you walk comfortably? able to walk for up a few minutes before having increased pain was unable to walk without pain    Patient Stated Goals improve flexibility and walking pattern so that she can take walks to improve her fitness   Pain Score 4    Pain Location Knee   Pain Orientation Right   Pain Descriptors / Indicators Sharp   Pain Onset 1 to 4 weeks ago   Pain Frequency Intermittent   Aggravating Factors  standing to long, twisitng    Pain Relieving Factors ice,                          [facility] Adult PT Treatment/Exercise - 12/06/15 0001      Knee/Hip Exercises: Stretches   Passive Hamstring Stretch Right;2 reps;30 seconds   Passive Hamstring Stretch Limitations long sitting    Quad Stretch Right;2 reps;60 seconds   Other Knee/Hip Stretches slant board 2x 60"       Knee/Hip Exercises: Standing   Heel Raises Both;15 reps   Knee Flexion Strengthening;Right;15 reps   Knee Flexion Limitations 4#   Forward Lunges Right;15 reps   Side Lunges Both;10 reps   Side Lunges Limitations 2#    Lateral Step Up Right;Step Height: 6"   Forward Step Up Right;15 reps;Step  Height: 6"   Step Down Right;15 reps;Hand Hold: 2;Step Height: 6"   Functional Squat 15 reps   Wall Squat 20 reps   Rocker Board 2 minutes   SLS 60 seconds      Knee/Hip Exercises: Seated   Sit to Sand 15 reps     Knee/Hip Exercises: Supine   Knee Extension --  2 was 5   Knee Flexion --  132  was 115                   PT Short Term Goals - 12/06/15 1343  PT SHORT TERM GOAL #1   Title Pt will demo consistency and independence with HEP to improve strength and ROM.    Time 2   Period Weeks   Status Achieved     PT SHORT TERM GOAL #2   Title Pt will demo correct sit to stand technique without noted weight shift and without cues from therapist x5 reps.    Time 2   Period Weeks   Status Achieved     PT SHORT TERM GOAL #3   Title Pt will demo correct heel toe sequencing without AD for atleast 200 ft without cues from therapist to decrease her risk of injury from compensation.    Time 4   Period Weeks   Status Achieved           PT Long Term Goals - 12/06/15 1344      PT LONG TERM GOAL #1   Title Pt will demo improved BLE strength to 5/5 MMT to decrease risk of injury with fucntional activity.    Time 6   Period Weeks   Status On-going     PT LONG TERM GOAL #2   Title Pt will demo improved functional strength evident by her ability to perform 5x sit to stand in less than 13 sec without UE and without significant weight shift    Time 6   Period Weeks   Status On-going     PT LONG TERM GOAL #3   Title Pt will demo improved endurance evident by atleast a 268f improvement in 3MWT distance with LRAD   Time 6   Period Weeks   Status New     PT LONG TERM GOAL #4   Title Pt will demo improved Rt knee ROM from atleast 0-120 deg to improve mechanics with functional tasks.    Time 6   Period Weeks   Status Partially Met               Plan - 12/06/15 1342    Clinical Impression Statement Pt ROM is close to normal at this time.  Pt doing well  with functinal strengthening with minimal verbal cuing needed for proper techniques.     Rehab Potential Good   PT Frequency Other (comment)   PT Duration 6 weeks   PT Treatment/Interventions Cryotherapy;ADLs/Self Care Home Management;Moist Heat;Gait training;Stair training;Functional mobility training;Therapeutic activities;Therapeutic exercise;Patient/family education;Neuromuscular re-education;Balance training;Manual techniques;Passive range of motion   PT Next Visit Plan add stair training and warrior poses   PT Home Exercise Plan knee flexion stretch 10x10sec, knee ext stretch x30 min, walking 10 min/day focus on proper technique, sit to stand x10    Consulted and Agree with Plan of Care Patient      Patient will benefit from skilled therapeutic intervention in order to improve the following deficits and impairments:  Abnormal gait, Decreased activity tolerance, Decreased balance, Decreased endurance, Decreased mobility, Decreased skin integrity, Difficulty walking, Hypomobility, Increased muscle spasms, Impaired sensation, Improper body mechanics, Pain, Postural dysfunction, Decreased strength, Increased fascial restricitons, Impaired flexibility, Decreased scar mobility, Decreased range of motion, Increased edema  Visit Diagnosis: Acute pain of right knee  Stiffness of right knee, not elsewhere classified  Unsteadiness on feet  Other abnormalities of gait and mobility  Localized edema     Problem List Patient Active Problem List   Diagnosis Date Noted  . Derangement of posterior horn of medial meniscus of right knee   . Right knee pain 09/10/2015  . Iron deficiency anemia 10/16/2013  .  Fibroids 08/21/2013  . Excessive or frequent menstruation 06/19/2013  . Morbid obesity ([place]) 07/12/2012  . Obstructive sleep apnea 06/21/2012  . Chest pain   . Hyperlipidemia   . Type 2 diabetes mellitus ([place])   . Obesity   . Laboratory test   . Nephrolithiasis   . Asthma   .  Endometriosis   . Hypertension   . Iron deficiency anemia 05/11/2009  . GERD 05/11/2009  Rayetta Humphrey, PT CLT [phone] 12/06/2015, 1:45 PM  [facility] [address] [place], [place], [address] Phone: [phone]   Fax:  [phone]  Name: Carrie Cook MRN: [id number] Date of Birth: 1960/12/03

## 2015-12-09 ENCOUNTER — Ambulatory Visit (HOSPITAL_COMMUNITY): Payer: Medicare Other | Admitting: Physical Therapy

## 2015-12-09 DIAGNOSIS — M25661 Stiffness of right knee, not elsewhere classified: Secondary | ICD-10-CM | POA: Diagnosis not present

## 2015-12-09 DIAGNOSIS — R2689 Other abnormalities of gait and mobility: Secondary | ICD-10-CM | POA: Diagnosis not present

## 2015-12-09 DIAGNOSIS — M25561 Pain in right knee: Secondary | ICD-10-CM | POA: Diagnosis not present

## 2015-12-09 DIAGNOSIS — R600 Localized edema: Secondary | ICD-10-CM

## 2015-12-09 DIAGNOSIS — R2681 Unsteadiness on feet: Secondary | ICD-10-CM

## 2015-12-09 NOTE — Therapy (Signed)
[place], [place], [address] Phone: [phone]   Fax:  [phone]  Physical Therapy Treatment  Patient Details  Name: Carrie Cook MRN: [id number] Date of Birth: Apr 12, 1960 Referring Provider: Arther Abbott, MD  Encounter Date: 12/09/2015      PT End of Session - 12/09/15 1642    Visit Number 6   Number of Visits 12   Date for PT Re-Evaluation 11/25/15   Authorization Type Medicare A & B   Authorization Time Period 11/04/15 to 12/16/15   Authorization - Visit Number 6   Authorization - Number of Visits 10   PT Start Time 1600   PT Stop Time 5790   PT Time Calculation (min) 41 min   Equipment Utilized During Treatment Gait belt   Activity Tolerance Patient tolerated treatment well;No increased pain   Behavior During Therapy WFL for tasks assessed/performed      Past Medical History:  Diagnosis Date  . Anemia    PT HAS HAD COLONOSCOPY AND ENDOSCOPY WORK UP - NO PROBLEMS FOUND AS SOURCE OF ANEMIA -- PT HAD IRON INFUSION AT [facility] 11/13/12-AFTER SEEING HEMATOLOGIST DR. [place] AND HE GAVE HEMATOLOGIC CLEARANCE FOR GASTRIC BYPASS SURGERY.  Marland Kitchen Anxiety   . Asthma    daily and prn inhalers  . Degenerative joint disease    right knee, spine - STATES INTERMITTENT  NUMBNESS DOWN RT LEG WITH PROLONGED STANDING OR WALKING- THINKS RELATED TO HER SPINE PROBLEMS  . Dental crowns present   . Depression   . Family history of anesthesia complication    pt's mother and sister have hx. of post-op N/V  . Fibroids    UTERINE  . Gastroesophageal reflux disease    none for 2 years since Bariatric surgery.  . H/O hiatal hernia   . History of endometriosis   . Hyperlipidemia    no current med.  . Hypertension    under control with med., has been on med. x 2 yr.  . Insulin dependent diabetes mellitus ([place])   . Lock jaw    jaw locks open if opens mouth wide  . Migraines   . Neuropathy ([place])    FEET  .  Obesity   . Palpitations   . PONV (postoperative nausea and vomiting)   . Shortness of breath    with daily activities  . Sleep apnea    hasnt had to use CPAP in 2 years since she lost weight    Past Surgical History:  Procedure Laterality Date  . BREATH TEK H PYLORI N/A 08/13/2012   Procedure: BREATH TEK H PYLORI;  Surgeon: Edward Jolly, MD;  Location: [facility];  Service: General;  Laterality: N/A;  . CARPAL TUNNEL RELEASE  01/03/2012   Procedure: CARPAL TUNNEL RELEASE;  Surgeon: Wynonia Sours, MD;  Location: [facility];  Service: Orthopedics;  Laterality: Right;  . carpal tunnel release right hand Right 12/13  . COLONOSCOPY  05/2009   XYB:FXOVANVB hemorrhoids otherwise normal colon, rectum and terminal ileum  . COLONOSCOPY WITH ESOPHAGOGASTRODUODENOSCOPY (EGD) N/A 10/28/2012   Procedure: COLONOSCOPY WITH ESOPHAGOGASTRODUODENOSCOPY (EGD);  Surgeon: Daneil Dolin, MD;  [facility];  Service: Endoscopy;  Laterality: N/A;  7:30  . DILITATION & CURRETTAGE/HYSTROSCOPY WITH NOVASURE ABLATION N/A 07/22/2014   Procedure: DILATATION & CURETTAGE/HYSTEROSCOPY WITH NOVASURE ABLATION; uterine length 6.0 cm; uterine width 3.8 cm; total ablation time 1 minute 15 seconds;  Surgeon: Florian Buff, MD;  [facility];  Service: Gynecology;  Laterality: N/A;  . ESOPHAGOGASTRODUODENOSCOPY  5/013/2011   CBU:LAGTXM esophagus/multiple polyps removed s/p (hyperplastic). Gastritis without H.pylori.  Marland Kitchen GASTRIC ROUX-EN-Y N/A 11/19/2012   Procedure: LAPAROSCOPIC ROUX-EN-Y GASTRIC;  Surgeon: Edward Jolly, MD;  [facility];  Service: General;  Laterality: N/A;  . GIVENS CAPSULE STUDY N/A 10/28/2012   Procedure: GIVENS CAPSULE STUDY;  Surgeon: Daneil Dolin, MD;  [facility];  Service: Endoscopy;  Laterality: N/A;  . KNEE ARTHROSCOPY WITH MEDIAL MENISECTOMY Right 10/28/2015   Procedure: RIGHT KNEE ARTHROSCOPY WITH MEDIAL MENISECTOMY;  Surgeon: Carole Civil, MD;  [facility];  Service: Orthopedics;  Laterality: Right;  . LAPAROSCOPY     due to endometriosis  . PELVIC LAPAROSCOPY  11/04/1999   with fulguration of endometriosis  . TRIGGER FINGER RELEASE Right 09/24/2012   Procedure: RELEASE A-1 PULLEY OF RIGHT THUMB;  Surgeon: Wynonia Sours, MD;  Location: [facility];  Service: Orthopedics;  Laterality: Right;  . TUBAL LIGATION  1993    There were no vitals filed for this visit.      Subjective Assessment - 12/09/15 1602    Subjective Pt reports that she tripped over her vacuum cord today and noticed some increase in pain and some swelling. She took motrin about 30 min ago and is not having any pain currently.   Pertinent History Asthma, anxiety, HTN, DM, Rt knee scope 10/28/15   Limitations Walking;Other (comment)  sleeping, unable to sleep on her side   How long can you sit comfortably? 10 minutes was 20 minutes    How long can you stand comfortably? able to for 15 minutes was less than 1 minute   How long can you walk comfortably? able to walk for up a few minutes before having increased pain was unable to walk without pain    Patient Stated Goals improve flexibility and walking pattern so that she can take walks to improve her fitness   Currently in Pain? No/denies   Pain Onset 1 to 4 weeks ago                         [facility] Adult PT Treatment/Exercise - 12/09/15 0001      Knee/Hip Exercises: Standing   Heel Raises Both;20 reps   Knee Flexion Strengthening;Both;2 sets;10 reps   Knee Flexion Limitations 4#   Forward Step Up Right;2 sets;15 reps;Hand Hold: 2;Step Height: 4";Hand Hold: 1   Forward Step Up Limitations with foam pad on step    Functional Squat 2 sets;10 reps   Functional Squat Limitations verbal cues for improving technique and weight through her toes      Knee/Hip Exercises: Supine   Bridges with Ball Squeeze Both;2 sets;15 reps             Balance Exercises -  12/09/15 1653      Balance Exercises: Standing   SLS Eyes open;Foam/compliant surface;2 reps;30 secs  Single leg    Balance Beam forward x3 RT; sid estepping x1 RT           PT Education - 12/09/15 1641    Education provided Yes   Education Details discussed noted progress and proposed d/c at next visit if progress continues   Person(s) Educated Patient   Methods Explanation   Comprehension Verbalized understanding          PT Short Term Goals - 12/06/15 1343      PT  SHORT TERM GOAL #1   Title Pt will demo consistency and independence with HEP to improve strength and ROM.    Time 2   Period Weeks   Status Achieved     PT SHORT TERM GOAL #2   Title Pt will demo correct sit to stand technique without noted weight shift and without cues from therapist x5 reps.    Time 2   Period Weeks   Status Achieved     PT SHORT TERM GOAL #3   Title Pt will demo correct heel toe sequencing without AD for atleast 200 ft without cues from therapist to decrease her risk of injury from compensation.    Time 4   Period Weeks   Status Achieved           PT Long Term Goals - 12/06/15 1344      PT LONG TERM GOAL #1   Title Pt will demo improved BLE strength to 5/5 MMT to decrease risk of injury with fucntional activity.    Time 6   Period Weeks   Status On-going     PT LONG TERM GOAL #2   Title Pt will demo improved functional strength evident by her ability to perform 5x sit to stand in less than 13 sec without UE and without significant weight shift    Time 6   Period Weeks   Status On-going     PT LONG TERM GOAL #3   Title Pt will demo improved endurance evident by atleast a 215f improvement in 3MWT distance with LRAD   Time 6   Period Weeks   Status New     PT LONG TERM GOAL #4   Title Pt will demo improved Rt knee ROM from atleast 0-120 deg to improve mechanics with functional tasks.    Time 6   Period Weeks   Status Partially Met               Plan -  12/09/15 1643    Clinical Impression Statement Today's session continued with progressions of functional strength and balance activity. Pt reporting a fall this afternoon with resulting pain for several hours, noting no swelling, pain or bruising upon her arrival, and valgus testing was negative. She was able to perform balance activity on dynamic surfaces with more issues with ankle proprioception than knee. Will continue to address this at her next session to decrease overall risk of falls and re-injury to her knee.   Rehab Potential Good   PT Frequency Other (comment)   PT Duration 6 weeks   PT Treatment/Interventions Cryotherapy;ADLs/Self Care Home Management;Moist Heat;Gait training;Stair training;Functional mobility training;Therapeutic activities;Therapeutic exercise;Patient/family education;Neuromuscular re-education;Balance training;Manual techniques;Passive range of motion   PT Next Visit Plan add stair training and warrior poses; ankle therex and d/c    PT Home Exercise Plan knee flexion stretch 10x10sec, knee ext stretch x30 min, walking 10 min/day focus on proper technique, sit to stand x10    Recommended Other Services none    Consulted and Agree with Plan of Care Patient      Patient will benefit from skilled therapeutic intervention in order to improve the following deficits and impairments:  Abnormal gait, Decreased activity tolerance, Decreased balance, Decreased endurance, Decreased mobility, Decreased skin integrity, Difficulty walking, Hypomobility, Increased muscle spasms, Impaired sensation, Improper body mechanics, Pain, Postural dysfunction, Decreased strength, Increased fascial restricitons, Impaired flexibility, Decreased scar mobility, Decreased range of motion, Increased edema  Visit Diagnosis: Acute pain of right knee  Stiffness of  right knee, not elsewhere classified  Unsteadiness on feet  Other abnormalities of gait and mobility  Localized edema        G-Codes - 2016-01-03 1649    Functional Assessment Tool Used clinical judgement based on assessment of strength, balance and functional mobility.   Functional Limitation Mobility: Walking and moving around   Mobility: Walking and Moving Around Current Status [phone]) At least 20 percent but less than 40 percent impaired, limited or restricted   Mobility: Walking and Moving Around Goal Status [phone]) At least 20 percent but less than 40 percent impaired, limited or restricted      Problem List Patient Active Problem List   Diagnosis Date Noted  . Derangement of posterior horn of medial meniscus of right knee   . Right knee pain 09/10/2015  . Iron deficiency anemia 10/16/2013  . Fibroids 08/21/2013  . Excessive or frequent menstruation 06/19/2013  . Morbid obesity ([place]) 07/12/2012  . Obstructive sleep apnea 06/21/2012  . Chest pain   . Hyperlipidemia   . Type 2 diabetes mellitus ([place])   . Obesity   . Laboratory test   . Nephrolithiasis   . Asthma   . Endometriosis   . Hypertension   . Iron deficiency anemia 05/11/2009  . GERD 05/11/2009    4:56 PM,01-03-2016 Elly Modena PT, DPT [facility] [place] [address] [place], [place], [address] Phone: [phone]   Fax:  [phone]  Name: Carrie Cook MRN: [id number] Date of Birth: 02-Jan-1961

## 2015-12-13 ENCOUNTER — Ambulatory Visit (HOSPITAL_COMMUNITY): Payer: Medicare Other | Admitting: Physical Therapy

## 2015-12-13 DIAGNOSIS — M25561 Pain in right knee: Secondary | ICD-10-CM | POA: Diagnosis not present

## 2015-12-13 DIAGNOSIS — M25661 Stiffness of right knee, not elsewhere classified: Secondary | ICD-10-CM

## 2015-12-13 DIAGNOSIS — R2689 Other abnormalities of gait and mobility: Secondary | ICD-10-CM | POA: Diagnosis not present

## 2015-12-13 DIAGNOSIS — R2681 Unsteadiness on feet: Secondary | ICD-10-CM | POA: Diagnosis not present

## 2015-12-13 DIAGNOSIS — R600 Localized edema: Secondary | ICD-10-CM | POA: Diagnosis not present

## 2015-12-13 NOTE — Therapy (Addendum)
[facility] [place], [place], [address] Phone: [phone]   Fax:  [phone]  Physical Therapy Treatment/Discharge  Patient Details  Name: Carrie Cook MRN: [id number] Date of Birth: August 22, 1960 Referring Provider: Arther Abbott, MD  Encounter Date: 12/13/2015      PT End of Session - 12/13/15 1645    Visit Number 7   Number of Visits 12   Date for PT Re-Evaluation 11/25/15   Authorization Type Medicare A & B   Authorization Time Period 11/04/15 to 12/16/15   Authorization - Visit Number 7   Authorization - Number of Visits 10   PT Start Time 1301   PT Stop Time 6945   PT Time Calculation (min) 44 min   Activity Tolerance Patient tolerated treatment well;No increased pain   Behavior During Therapy WFL for tasks assessed/performed      Past Medical History:  Diagnosis Date  . Anemia    PT HAS HAD COLONOSCOPY AND ENDOSCOPY WORK UP - NO PROBLEMS FOUND AS SOURCE OF ANEMIA -- PT HAD IRON INFUSION AT [facility] 11/13/12-AFTER SEEING HEMATOLOGIST DR. [place] AND HE GAVE HEMATOLOGIC CLEARANCE FOR GASTRIC BYPASS SURGERY.  Marland Kitchen Anxiety   . Asthma    daily and prn inhalers  . Degenerative joint disease    right knee, spine - STATES INTERMITTENT  NUMBNESS DOWN RT LEG WITH PROLONGED STANDING OR WALKING- THINKS RELATED TO HER SPINE PROBLEMS  . Dental crowns present   . Depression   . Family history of anesthesia complication    pt's mother and sister have hx. of post-op N/V  . Fibroids    UTERINE  . Gastroesophageal reflux disease    none for 2 years since Bariatric surgery.  . H/O hiatal hernia   . History of endometriosis   . Hyperlipidemia    no current med.  . Hypertension    under control with med., has been on med. x 2 yr.  . Insulin dependent diabetes mellitus ([place])   . Lock jaw    jaw locks open if opens mouth wide  . Migraines   . Neuropathy ([place])    FEET  . Obesity   . Palpitations   . PONV  (postoperative nausea and vomiting)   . Shortness of breath    with daily activities  . Sleep apnea    hasnt had to use CPAP in 2 years since she lost weight    Past Surgical History:  Procedure Laterality Date  . BREATH TEK H PYLORI N/A 08/13/2012   Procedure: BREATH TEK H PYLORI;  Surgeon: Edward Jolly, MD;  Location: [facility];  Service: General;  Laterality: N/A;  . CARPAL TUNNEL RELEASE  01/03/2012   Procedure: CARPAL TUNNEL RELEASE;  Surgeon: Wynonia Sours, MD;  Location: [facility];  Service: Orthopedics;  Laterality: Right;  . carpal tunnel release right hand Right 12/13  . COLONOSCOPY  05/2009   WTU:[id number] hemorrhoids otherwise normal colon, rectum and terminal ileum  . COLONOSCOPY WITH ESOPHAGOGASTRODUODENOSCOPY (EGD) N/A 10/28/2012   Procedure: COLONOSCOPY WITH ESOPHAGOGASTRODUODENOSCOPY (EGD);  Surgeon: Daneil Dolin, MD;  [facility];  Service: Endoscopy;  Laterality: N/A;  7:30  . DILITATION & CURRETTAGE/HYSTROSCOPY WITH NOVASURE ABLATION N/A 07/22/2014   Procedure: DILATATION & CURETTAGE/HYSTEROSCOPY WITH NOVASURE ABLATION; uterine length 6.0 cm; uterine width 3.8 cm; total ablation time 1 minute 15 seconds;  Surgeon: Florian Buff, MD;  [facility];  Service: Gynecology;  Laterality: N/A;  .  ESOPHAGOGASTRODUODENOSCOPY  5/013/2011   ZOX:WRUEAV esophagus/multiple polyps removed s/p (hyperplastic). Gastritis without H.pylori.  Marland Kitchen GASTRIC ROUX-EN-Y N/A 11/19/2012   Procedure: LAPAROSCOPIC ROUX-EN-Y GASTRIC;  Surgeon: Edward Jolly, MD;  [facility];  Service: General;  Laterality: N/A;  . GIVENS CAPSULE STUDY N/A 10/28/2012   Procedure: GIVENS CAPSULE STUDY;  Surgeon: Daneil Dolin, MD;  [facility];  Service: Endoscopy;  Laterality: N/A;  . KNEE ARTHROSCOPY WITH MEDIAL MENISECTOMY Right 10/28/2015   Procedure: RIGHT KNEE ARTHROSCOPY WITH MEDIAL MENISECTOMY;  Surgeon: Carole Civil, MD;  [facility];   Service: Orthopedics;  Laterality: Right;  . LAPAROSCOPY     due to endometriosis  . PELVIC LAPAROSCOPY  11/04/1999   with fulguration of endometriosis  . TRIGGER FINGER RELEASE Right 09/24/2012   Procedure: RELEASE A-1 PULLEY OF RIGHT THUMB;  Surgeon: Wynonia Sours, MD;  Location: [facility];  Service: Orthopedics;  Laterality: Right;  . TUBAL LIGATION  1993    There were no vitals filed for this visit.      Subjective Assessment - 12/13/15 1305    Subjective Pt reports that she got her leg caught in the truck over the weekend and hyperextended her knee. She feels that it actually helped overall with her flexibility. She has no complaints currently.    Pertinent History Asthma, anxiety, HTN, DM, Rt knee scope 10/28/15   Limitations Walking;Other (comment)  sleeping, unable to sleep on her side   How long can you sit comfortably? 2 hours +    How long can you stand comfortably? 30 minutes on hard  floors, but this can vary from day to day.    How long can you walk comfortably? only able to from here to across the room before it starts to hurt   Patient Stated Goals improve flexibility and walking pattern so that she can take walks to improve her fitness   Currently in Pain? No/denies   Pain Onset 1 to 4 weeks ago            [facility] PT Assessment - 12/13/15 0001      Assessment   Medical Diagnosis s/p Rt knee scope    Referring Provider Arther Abbott, MD   Onset Date/Surgical Date 10/28/15   Next MD Visit Late December or January    Prior Therapy none      Precautions   Precautions None     Balance Screen   Has the patient fallen in the past 6 months No   Has the patient had a decrease in activity level because of a fear of falling?  No   Is the patient reluctant to leave their home because of a fear of falling?  No     Home Ec[employer] residence     Prior Function   Level of Independence Independent   Leisure care for her  grandchildren, horses, cats     Cognition   Overall Cognitive Status Within Functional Limits for tasks assessed     Observation/Other Assessments   Observations --   Focus on Therapeutic Outcomes (FOTO)  56% limited      Sensation   Additional Comments Reports numbness on lateral knee      AROM   Right Knee Extension 0   Right Knee Flexion 136   Left Knee Extension 0   Left Knee Flexion 140     Strength   Right Hip Flexion 5/5   Right Hip  Extension 5/5   Right Hip ABduction 5/5   Left Hip Flexion 5/5   Left Hip Extension 5/5   Left Hip ABduction 5/5   Right Knee Flexion 5/5   Right Knee Extension 5/5   Left Knee Flexion 5/5   Left Knee Extension 5/5   Right Ankle Dorsiflexion 5/5   Left Ankle Dorsiflexion 5/5     Palpation   Palpation comment non tender with palpation      Transfers   Five time sit to stand comments  9 sec, no UE   Comments SLS: B 20 sec without LOB      Ambulation/Gait   Ambulation/Gait --   Ambulation/Gait Assistance --   Ambulation Distance (Feet) --   Assistive device --   Gait Pattern --   Gait Comments no deviations noted      6 minute walk test results    Endurance additional comments 3MWT: 470 ft, no AD                     [facility] Adult PT Treatment/Exercise - 12/13/15 0001      Exercises   Exercises Other Exercises   Other Exercises  ankle 4 way with blue TB x5 reps each, RLE  HEP demo                PT Education - 12/13/15 1309    Education provided Yes   Education Details goals met/progress made; importance of finding low impact aerobic exercise to maintain fitness and overall wellness; updated HEP   Person(s) Educated Patient   Methods Explanation;Demonstration;Handout   Comprehension Verbalized understanding;Returned demonstration          PT Short Term Goals - 12/13/15 1326      PT SHORT TERM GOAL #1   Title Pt will demo consistency and independence with HEP to improve strength and ROM.     Time 2   Period Weeks   Status Achieved     PT SHORT TERM GOAL #2   Title Pt will demo correct sit to stand technique without noted weight shift and without cues from therapist x5 reps.    Time 2   Period Weeks   Status Achieved     PT SHORT TERM GOAL #3   Title Pt will demo correct heel toe sequencing without AD for atleast 200 ft without cues from therapist to decrease her risk of injury from compensation.    Time 4   Period Weeks   Status Achieved           PT Long Term Goals - 12/13/15 1327      PT LONG TERM GOAL #1   Title Pt will demo improved BLE strength to 5/5 MMT to decrease risk of injury with fucntional activity.    Time 6   Period Weeks   Status Achieved     PT LONG TERM GOAL #2   Title Pt will demo improved functional strength evident by her ability to perform 5x sit to stand in less than 13 sec without UE and without significant weight shift    Baseline 9 sec   Time 6   Period Weeks   Status Achieved     PT LONG TERM GOAL #3   Title Pt will demo improved endurance evident by atleast a 261f improvement in 3MWT distance with LRAD   Baseline 7723f no AD   Time 6   Period Weeks   Status Achieved     PT  LONG TERM GOAL #4   Title Pt will demo improved Rt knee ROM from atleast 0-120 deg to improve mechanics with functional tasks.    Baseline 135 deg flexion   Time 6   Period Weeks   Status Achieved               Plan - 2016-01-03 1341    Clinical Impression Statement Pt has made great progress since beginning PT several weeks ago. She demonstrates full and pain free knee ROM, BLE strength that is 5/5 MMT, improved functional strength and balance. Her endurance and activity tolerance has also improved significantly since beginning PT. At this time, she has met all of her goals and is consistently performing her HEP at home without reported difficulty. She no longer requires skilled PT services and is in agreement with d/c at this time, pleased with  her progress.    Rehab Potential Good   PT Frequency Other (comment)   PT Duration 6 weeks   PT Treatment/Interventions Cryotherapy;ADLs/Self Care Home Management;Moist Heat;Gait training;Stair training;Functional mobility training;Therapeutic activities;Therapeutic exercise;Patient/family education;Neuromuscular re-education;Balance training;Manual techniques;Passive range of motion   PT Next Visit Plan d/c    PT Home Exercise Plan tandem hold, SLS, wall squats, functional mini squats, pool strengthening when able   Recommended Other Services none    Consulted and Agree with Plan of Care Patient      Patient will benefit from skilled therapeutic intervention in order to improve the following deficits and impairments:  Abnormal gait, Decreased activity tolerance, Decreased balance, Decreased endurance, Decreased mobility, Decreased skin integrity, Difficulty walking, Hypomobility, Increased muscle spasms, Impaired sensation, Improper body mechanics, Pain, Postural dysfunction, Decreased strength, Increased fascial restricitons, Impaired flexibility, Decreased scar mobility, Decreased range of motion, Increased edema  Visit Diagnosis: Acute pain of right knee  Stiffness of right knee, not elsewhere classified  Unsteadiness on feet  Other abnormalities of gait and mobility  Localized edema       G-Codes - 2016-01-03 1651    Functional Assessment Tool Used clinical judgement based on assessment of strength, balance and functional mobility.   Functional Limitation Mobility: Walking and moving around   Mobility: Walking and Moving Around Goal Status [phone]) At least 20 percent but less than 40 percent impaired, limited or restricted   Mobility: Walking and Moving Around Discharge Status [phone]) At least 20 percent but less than 40 percent impaired, limited or restricted      Problem List Patient Active Problem List   Diagnosis Date Noted  . Derangement of posterior horn of medial  meniscus of right knee   . Right knee pain 09/10/2015  . Iron deficiency anemia 10/16/2013  . Fibroids 08/21/2013  . Excessive or frequent menstruation 06/19/2013  . Morbid obesity ([place]) 07/12/2012  . Obstructive sleep apnea 06/21/2012  . Chest pain   . Hyperlipidemia   . Type 2 diabetes mellitus ([place])   . Obesity   . Laboratory test   . Nephrolithiasis   . Asthma   . Endometriosis   . Hypertension   . Iron deficiency anemia 05/11/2009  . GERD 05/11/2009   4:55 PM,2016/01/03 Elly Modena PT, DPT [facility] [place] [address] [place], [place], [address] Phone: [phone]   Fax:  [phone]  Name: JAZIYAH GRADEL MRN: [id number] Date of Birth: 02/12/1960  *Addendum for discharge summary info PHYSICAL THERAPY DISCHARGE SUMMARY  Visits from Start of Care: 7  Current functional level related to  goals / functional outcomes: 5/5 MMT in BLE, functional measures of sit to stand and TUG are within age expected norms, 20+ sec on each LE during SLS, improved activity tolerance and endurance   Remaining deficits: Pt reports continued pain during walking activity, however no pain was reported during [employer]   Education / Equipment: Advanced HEP Plan: Patient agrees to discharge.  Patient goals were met. Patient is being discharged due to meeting the stated rehab goals.  ?????         4:57 PM,12/13/15 Elly Modena PT, [facility] [phone]

## 2016-01-25 ENCOUNTER — Encounter: Payer: Self-pay | Admitting: Orthopedic Surgery

## 2016-01-25 ENCOUNTER — Ambulatory Visit (INDEPENDENT_AMBULATORY_CARE_PROVIDER_SITE_OTHER): Payer: Self-pay | Admitting: Orthopedic Surgery

## 2016-01-25 DIAGNOSIS — Z4889 Encounter for other specified surgical aftercare: Secondary | ICD-10-CM

## 2016-01-25 DIAGNOSIS — Z9889 Other specified postprocedural states: Secondary | ICD-10-CM

## 2016-01-25 NOTE — Progress Notes (Signed)
Patient ID: Carrie Cook, female   DOB: 1960/07/15, 56 y.o.   MRN: [id number]  Follow up visit/  postop knee arthroscopy  Chief Complaint  Patient presents with  . Follow-up    SARK, DOS 10/28/15    She's doing much better he says occasional discomfort. She improved greatly with physical therapy  She's regained full range of motion normal gait pattern  She has chondral lesion over the medial femoral condyle medial tibial toe great #4 her. She understands she will have to have some other procedure done in the future  Follow-up as needed Encounter Diagnoses  Name Primary?  . S/P right knee arthroscopy Yes  . Aftercare following surgery     LMP 07/10/2013   11:44 AM Arther Abbott, MD 01/25/2016

## 2016-02-20 ENCOUNTER — Other Ambulatory Visit: Payer: Self-pay | Admitting: Family Medicine

## 2016-03-20 DIAGNOSIS — F339 Major depressive disorder, recurrent, unspecified: Secondary | ICD-10-CM | POA: Diagnosis not present

## 2016-05-27 ENCOUNTER — Other Ambulatory Visit: Payer: Self-pay | Admitting: Family Medicine

## 2016-05-29 ENCOUNTER — Other Ambulatory Visit: Payer: Self-pay | Admitting: Family Medicine

## 2016-06-23 ENCOUNTER — Encounter (HOSPITAL_COMMUNITY): Payer: Self-pay

## 2016-06-26 ENCOUNTER — Other Ambulatory Visit: Payer: Self-pay | Admitting: Family Medicine

## 2016-07-03 DIAGNOSIS — F339 Major depressive disorder, recurrent, unspecified: Secondary | ICD-10-CM | POA: Diagnosis not present

## 2016-07-13 ENCOUNTER — Other Ambulatory Visit: Payer: Self-pay | Admitting: Obstetrics & Gynecology

## 2016-07-17 ENCOUNTER — Encounter: Payer: Self-pay | Admitting: Family Medicine

## 2016-07-17 ENCOUNTER — Ambulatory Visit (INDEPENDENT_AMBULATORY_CARE_PROVIDER_SITE_OTHER): Payer: Medicare Other | Admitting: Family Medicine

## 2016-07-17 VITALS — BP 114/68 | Ht 63.0 in | Wt 191.0 lb

## 2016-07-17 DIAGNOSIS — Z1159 Encounter for screening for other viral diseases: Secondary | ICD-10-CM | POA: Diagnosis not present

## 2016-07-17 DIAGNOSIS — E7849 Other hyperlipidemia: Secondary | ICD-10-CM

## 2016-07-17 DIAGNOSIS — E784 Other hyperlipidemia: Secondary | ICD-10-CM

## 2016-07-17 DIAGNOSIS — Z79899 Other long term (current) drug therapy: Secondary | ICD-10-CM

## 2016-07-17 DIAGNOSIS — Z114 Encounter for screening for human immunodeficiency virus [HIV]: Secondary | ICD-10-CM

## 2016-07-17 DIAGNOSIS — E119 Type 2 diabetes mellitus without complications: Secondary | ICD-10-CM

## 2016-07-17 DIAGNOSIS — I10 Essential (primary) hypertension: Secondary | ICD-10-CM | POA: Diagnosis not present

## 2016-07-17 LAB — POCT GLYCOSYLATED HEMOGLOBIN (HGB A1C): Hemoglobin A1C: 6

## 2016-07-17 MED ORDER — INSULIN GLARGINE 100 UNIT/ML SOLOSTAR PEN: 6.0000 [IU] | PEN_INJECTOR | Freq: Every evening | SUBCUTANEOUS | 5 refills | Status: DC

## 2016-07-17 MED ORDER — METOPROLOL TARTRATE 25 MG PO TABS: 25.0000 mg | ORAL_TABLET | Freq: Two times a day (BID) | ORAL | 1 refills | Status: DC

## 2016-07-17 MED ORDER — METFORMIN HCL 500 MG PO TABS: ORAL_TABLET | ORAL | 1 refills | Status: DC

## 2016-07-17 MED ORDER — LOSARTAN POTASSIUM 50 MG PO TABS: 50.0000 mg | ORAL_TABLET | Freq: Every day | ORAL | 5 refills | Status: DC

## 2016-07-17 MED ORDER — ATORVASTATIN CALCIUM 10 MG PO TABS: ORAL_TABLET | ORAL | 1 refills | Status: DC

## 2016-07-17 NOTE — Patient Instructions (Signed)
Diabetes Mellitus and Food It is important for you to manage your blood sugar (glucose) level. Your blood glucose level can be greatly affected by what you eat. Eating healthier foods in the appropriate amounts throughout the day at about the same time each day will help you control your blood glucose level. It can also help slow or prevent worsening of your diabetes mellitus. Healthy eating may even help you improve the level of your blood pressure and reach or maintain a healthy weight. General recommendations for healthful eating and cooking habits include:  Eating meals and snacks regularly. Avoid going long periods of time without eating to lose weight.  Eating a diet that consists mainly of plant-based foods, such as fruits, vegetables, nuts, legumes, and whole grains.  Using low-heat cooking methods, such as baking, instead of high-heat cooking methods, such as deep frying.  Work with your dietitian to make sure you understand how to use the Nutrition Facts information on food labels. How can food affect me? Carbohydrates Carbohydrates affect your blood glucose level more than any other type of food. Your dietitian will help you determine how many carbohydrates to eat at each meal and teach you how to count carbohydrates. Counting carbohydrates is important to keep your blood glucose at a healthy level, especially if you are using insulin or taking certain medicines for diabetes mellitus. Alcohol Alcohol can cause sudden decreases in blood glucose (hypoglycemia), especially if you use insulin or take certain medicines for diabetes mellitus. Hypoglycemia can be a life-threatening condition. Symptoms of hypoglycemia (sleepiness, dizziness, and disorientation) are similar to symptoms of having too much alcohol. If your health care provider has given you approval to drink alcohol, do so in moderation and use the following guidelines:  Women should not have more than one drink per day, and men  should not have more than two drinks per day. One drink is equal to: ? 12 oz of beer. ? 5 oz of wine. ? 1 oz of hard liquor.  Do not drink on an empty stomach.  Keep yourself hydrated. Have water, diet soda, or unsweetened iced tea.  Regular soda, juice, and other mixers might contain a lot of carbohydrates and should be counted.  What foods are not recommended? As you make food choices, it is important to remember that all foods are not the same. Some foods have fewer nutrients per serving than other foods, even though they might have the same number of calories or carbohydrates. It is difficult to get your body what it needs when you eat foods with fewer nutrients. Examples of foods that you should avoid that are high in calories and carbohydrates but low in nutrients include:  Trans fats (most processed foods list trans fats on the Nutrition Facts label).  Regular soda.  Juice.  Candy.  Sweets, such as cake, pie, doughnuts, and cookies.  Fried foods.  What foods can I eat? Eat nutrient-rich foods, which will nourish your body and keep you healthy. The food you should eat also will depend on several factors, including:  The calories you need.  The medicines you take.  Your weight.  Your blood glucose level.  Your blood pressure level.  Your cholesterol level.  You should eat a variety of foods, including:  Protein. ? Lean cuts of meat. ? Proteins low in saturated fats, such as fish, egg whites, and beans. Avoid processed meats.  Fruits and vegetables. ? Fruits and vegetables that may help control blood glucose levels, such as apples,   mangoes, and yams.  Dairy products. ? Choose fat-free or low-fat dairy products, such as milk, yogurt, and cheese.  Grains, bread, pasta, and rice. ? Choose whole grain products, such as multigrain bread, whole oats, and brown rice. These foods may help control blood pressure.  Fats. ? Foods containing healthful fats, such as  nuts, avocado, olive oil, canola oil, and fish.  Does everyone with diabetes mellitus have the same meal plan? Because every person with diabetes mellitus is different, there is not one meal plan that works for everyone. It is very important that you meet with a dietitian who will help you create a meal plan that is just right for you. This information is not intended to replace advice given to you by your health care provider. Make sure you discuss any questions you have with your health care provider. Document Released: 10/06/2004 Document Revised: 06/17/2015 Document Reviewed: 12/06/2012 Elsevier Interactive Patient Education  2017 [employer] Inc[employer]  

## 2016-07-17 NOTE — Progress Notes (Signed)
   Subjective:    Patient ID: Carrie Cook, female    DOB: 09-04-1960, 56 y.o.   MRN: [id number]  Diabetes  She presents for her follow-up diabetic visit. She has type 2 diabetes mellitus. Pertinent negatives for hypoglycemia include no confusion. Pertinent negatives for diabetes include no chest pain, no fatigue, no polydipsia, no polyphagia and no weakness. She is compliant with treatment all of the time. She is following a generally unhealthy diet. She rarely participates in exercise. Home blood sugar record trend: does not check blood sugar. She does not see a podiatrist.Eye exam is not current.  A1C 6.0  Pt states no concerns today.  She uses her albuterol occasionally Uses her insulin on a regular basis  Denies any excessive thirst urination  Denies problems with her metformin  Metoprolol being used as directed. Takes her cholesterol medicine but needs refills    Review of Systems  Constitutional: Negative for activity change, appetite change and fatigue.  HENT: Negative for congestion.   Respiratory: Negative for cough.   Cardiovascular: Negative for chest pain.  Gastrointestinal: Negative for abdominal pain.  Endocrine: Negative for polydipsia and polyphagia.  Neurological: Negative for weakness.  Psychiatric/Behavioral: Negative for confusion.       Objective:   Physical Exam  Constitutional: She appears well-nourished. No distress.  Cardiovascular: Normal rate, regular rhythm and normal heart sounds.   No murmur heard. Pulmonary/Chest: Effort normal and breath sounds normal. No respiratory distress.  Musculoskeletal: She exhibits no edema.  Lymphadenopathy:    She has no cervical adenopathy.  Neurological: She is alert. She exhibits normal muscle tone.  Psychiatric: Her behavior is normal.  Vitals reviewed.         Assessment & Plan:  Diabetes very good control despite her subpar overall approach she is been very busy with the sickness and subsequent  death of her mother  Patient understands importance keeping diabetes under good control to avoid low sugars as well as to keep her diabetes under good control she will do her lab work  Hyperlipidemia continue medication check lab work previous labs reviewed  Blood pressure good control continue current measures watch diet closely  25 minutes was spent with the patient. Greater than half the time was spent in discussion and answering questions and counseling regarding the issues that the patient came in for today.

## 2016-07-21 ENCOUNTER — Other Ambulatory Visit: Payer: Self-pay | Admitting: Obstetrics & Gynecology

## 2016-08-08 DIAGNOSIS — Z114 Encounter for screening for human immunodeficiency virus [HIV]: Secondary | ICD-10-CM | POA: Diagnosis not present

## 2016-08-08 DIAGNOSIS — E119 Type 2 diabetes mellitus without complications: Secondary | ICD-10-CM | POA: Diagnosis not present

## 2016-08-08 DIAGNOSIS — Z79899 Other long term (current) drug therapy: Secondary | ICD-10-CM | POA: Diagnosis not present

## 2016-08-08 DIAGNOSIS — E784 Other hyperlipidemia: Secondary | ICD-10-CM | POA: Diagnosis not present

## 2016-08-08 DIAGNOSIS — Z1159 Encounter for screening for other viral diseases: Secondary | ICD-10-CM | POA: Diagnosis not present

## 2016-08-08 DIAGNOSIS — I10 Essential (primary) hypertension: Secondary | ICD-10-CM | POA: Diagnosis not present

## 2016-08-11 LAB — HEPATIC FUNCTION PANEL
ALT: 23 IU/L (ref 0–32)
AST: 21 IU/L (ref 0–40)
Albumin: 4.2 g/dL (ref 3.5–5.5)
Alkaline Phosphatase: 117 IU/L (ref 39–117)
Bilirubin Total: 0.4 mg/dL (ref 0.0–1.2)
Bilirubin, Direct: 0.12 mg/dL (ref 0.00–0.40)
Total Protein: 6.5 g/dL (ref 6.0–8.5)

## 2016-08-11 LAB — BASIC METABOLIC PANEL
BUN/Creatinine Ratio: 15 (ref 9–23)
BUN: 11 mg/dL (ref 6–24)
CO2: 22 mmol/L (ref 20–29)
Calcium: 9.3 mg/dL (ref 8.7–10.2)
Chloride: 102 mmol/L (ref 96–106)
Creatinine, Ser: 0.73 mg/dL (ref 0.57–1.00)
GFR calc Af Amer: 108 mL/min/{1.73_m2} (ref >59)
GFR calc non Af Amer: 94 mL/min/{1.73_m2} (ref >59)
Glucose: 144 mg/dL — ABNORMAL HIGH (ref 65–99)
Potassium: 4.3 mmol/L (ref 3.5–5.2)
Sodium: 141 mmol/L (ref 134–144)

## 2016-08-11 LAB — MICROALBUMIN / CREATININE URINE RATIO
Creatinine, Urine: 180.3 mg/dL
Microalb/Creat Ratio: 4.4 mg/g creat (ref 0.0–30.0)
Microalbumin, Urine: 7.9 ug/mL

## 2016-08-11 LAB — LIPID PANEL
Chol/HDL Ratio: 3.9 ratio (ref 0.0–4.4)
Cholesterol, Total: 167 mg/dL (ref 100–199)
HDL: 43 mg/dL (ref >39)
LDL Calculated: 78 mg/dL (ref 0–99)
Triglycerides: 230 mg/dL — ABNORMAL HIGH (ref 0–149)
VLDL Cholesterol Cal: 46 mg/dL — ABNORMAL HIGH (ref 5–40)

## 2016-08-11 LAB — HIV ANTIBODY (ROUTINE TESTING W REFLEX): HIV Screen 4th Generation wRfx: NONREACTIVE

## 2016-08-11 LAB — HEPATITIS C ANTIBODY: Hep C Virus Ab: <0.1 s/co ratio (ref 0.0–0.9)

## 2016-08-15 DIAGNOSIS — F339 Major depressive disorder, recurrent, unspecified: Secondary | ICD-10-CM | POA: Diagnosis not present

## 2016-09-13 DIAGNOSIS — F339 Major depressive disorder, recurrent, unspecified: Secondary | ICD-10-CM | POA: Diagnosis not present

## 2016-09-15 ENCOUNTER — Other Ambulatory Visit: Payer: Self-pay | Admitting: Family Medicine

## 2016-11-09 DIAGNOSIS — F339 Major depressive disorder, recurrent, unspecified: Secondary | ICD-10-CM | POA: Diagnosis not present

## 2017-01-21 ENCOUNTER — Other Ambulatory Visit: Payer: Self-pay | Admitting: Family Medicine

## 2017-01-26 DIAGNOSIS — F339 Major depressive disorder, recurrent, unspecified: Secondary | ICD-10-CM | POA: Diagnosis not present

## 2017-02-19 ENCOUNTER — Other Ambulatory Visit: Payer: Self-pay | Admitting: Family Medicine

## 2017-03-20 ENCOUNTER — Encounter: Payer: Self-pay | Admitting: Family Medicine

## 2017-03-20 ENCOUNTER — Ambulatory Visit (INDEPENDENT_AMBULATORY_CARE_PROVIDER_SITE_OTHER): Payer: Medicare Other | Admitting: Family Medicine

## 2017-03-20 VITALS — BP 126/84 | Ht 63.0 in | Wt 196.8 lb

## 2017-03-20 DIAGNOSIS — I10 Essential (primary) hypertension: Secondary | ICD-10-CM | POA: Diagnosis not present

## 2017-03-20 DIAGNOSIS — E119 Type 2 diabetes mellitus without complications: Secondary | ICD-10-CM

## 2017-03-20 DIAGNOSIS — E7849 Other hyperlipidemia: Secondary | ICD-10-CM

## 2017-03-20 DIAGNOSIS — Z79899 Other long term (current) drug therapy: Secondary | ICD-10-CM | POA: Diagnosis not present

## 2017-03-20 DIAGNOSIS — Z794 Long term (current) use of insulin: Secondary | ICD-10-CM | POA: Diagnosis not present

## 2017-03-20 LAB — POCT GLYCOSYLATED HEMOGLOBIN (HGB A1C): Hemoglobin A1C: 6.1

## 2017-03-20 MED ORDER — FLUTICASONE PROPIONATE HFA 220 MCG/ACT IN AERO: 2.0000 | INHALATION_SPRAY | Freq: Two times a day (BID) | RESPIRATORY_TRACT | 12 refills | Status: AC

## 2017-03-20 MED ORDER — METFORMIN HCL 500 MG PO TABS: ORAL_TABLET | ORAL | 1 refills | Status: DC

## 2017-03-20 MED ORDER — ATORVASTATIN CALCIUM 10 MG PO TABS: ORAL_TABLET | ORAL | 1 refills | Status: DC

## 2017-03-20 MED ORDER — INSULIN GLARGINE 100 UNIT/ML SOLOSTAR PEN: PEN_INJECTOR | SUBCUTANEOUS | 5 refills | Status: DC

## 2017-03-20 MED ORDER — LOSARTAN POTASSIUM 100 MG PO TABS: 100.0000 mg | ORAL_TABLET | Freq: Every day | ORAL | 1 refills | Status: DC

## 2017-03-20 MED ORDER — ALBUTEROL SULFATE HFA 108 (90 BASE) MCG/ACT IN AERS
2.0000 | INHALATION_SPRAY | Freq: Four times a day (QID) | RESPIRATORY_TRACT | 6 refills | Status: DC | PRN
Start: 2017-03-20 — End: 2020-11-22

## 2017-03-20 MED ORDER — METOPROLOL TARTRATE 25 MG PO TABS: 25.0000 mg | ORAL_TABLET | Freq: Two times a day (BID) | ORAL | 1 refills | Status: DC

## 2017-03-20 NOTE — Patient Instructions (Signed)
Diabetes Mellitus and Nutrition When you have diabetes (diabetes mellitus), it is very important to have healthy eating habits because your blood sugar (glucose) levels are greatly affected by what you eat and drink. Eating healthy foods in the appropriate amounts, at about the same times every day, can help you:  Control your blood glucose.  Lower your risk of heart disease.  Improve your blood pressure.  Reach or maintain a healthy weight.  Every person with diabetes is different, and each person has different needs for a meal plan. Your health care provider may recommend that you work with a diet and nutrition specialist (dietitian) to make a meal plan that is best for you. Your meal plan may vary depending on factors such as:  The calories you need.  The medicines you take.  Your weight.  Your blood glucose, blood pressure, and cholesterol levels.  Your activity level.  Other health conditions you have, such as heart or kidney disease.  How do carbohydrates affect me? Carbohydrates affect your blood glucose level more than any other type of food. Eating carbohydrates naturally increases the amount of glucose in your blood. Carbohydrate counting is a method for keeping track of how many carbohydrates you eat. Counting carbohydrates is important to keep your blood glucose at a healthy level, especially if you use insulin or take certain oral diabetes medicines. It is important to know how many carbohydrates you can safely have in each meal. This is different for every person. Your dietitian can help you calculate how many carbohydrates you should have at each meal and for snack. Foods that contain carbohydrates include:  Bread, cereal, rice, pasta, and crackers.  Potatoes and corn.  Peas, beans, and lentils.  Milk and yogurt.  Fruit and juice.  Desserts, such as cakes, cookies, ice cream, and candy.  How does alcohol affect me? Alcohol can cause a sudden decrease in blood  glucose (hypoglycemia), especially if you use insulin or take certain oral diabetes medicines. Hypoglycemia can be a life-threatening condition. Symptoms of hypoglycemia (sleepiness, dizziness, and confusion) are similar to symptoms of having too much alcohol. If your health care provider says that alcohol is safe for you, follow these guidelines:  Limit alcohol intake to no more than 1 drink per day for nonpregnant women and 2 drinks per day for men. One drink equals 12 oz of beer, 5 oz of wine, or 1 oz of hard liquor.  Do not drink on an empty stomach.  Keep yourself hydrated with water, diet soda, or unsweetened iced tea.  Keep in mind that regular soda, juice, and other mixers may contain a lot of sugar and must be counted as carbohydrates.  What are tips for following this plan? Reading food labels  Start by checking the serving size on the label. The amount of calories, carbohydrates, fats, and other nutrients listed on the label are based on one serving of the food. Many foods contain more than one serving per package.  Check the total grams (g) of carbohydrates in one serving. You can calculate the number of servings of carbohydrates in one serving by dividing the total carbohydrates by 15. For example, if a food has 30 g of total carbohydrates, it would be equal to 2 servings of carbohydrates.  Check the number of grams (g) of saturated and trans fats in one serving. Choose foods that have low or no amount of these fats.  Check the number of milligrams (mg) of sodium in one serving. Most people   should limit total sodium intake to less than 2,300 mg per day.  Always check the nutrition information of foods labeled as "low-fat" or "nonfat". These foods may be higher in added sugar or refined carbohydrates and should be avoided.  Talk to your dietitian to identify your daily goals for nutrients listed on the label. Shopping  Avoid buying canned, premade, or processed foods. These  foods tend to be high in fat, sodium, and added sugar.  Shop around the outside edge of the grocery store. This includes fresh fruits and vegetables, bulk grains, fresh meats, and fresh dairy. Cooking  Use low-heat cooking methods, such as baking, instead of high-heat cooking methods like deep frying.  Cook using healthy oils, such as olive, canola, or sunflower oil.  Avoid cooking with butter, cream, or high-fat meats. Meal planning  Eat meals and snacks regularly, preferably at the same times every day. Avoid going long periods of time without eating.  Eat foods high in fiber, such as fresh fruits, vegetables, beans, and whole grains. Talk to your dietitian about how many servings of carbohydrates you can eat at each meal.  Eat 4-6 ounces of lean protein each day, such as lean meat, chicken, fish, eggs, or tofu. 1 ounce is equal to 1 ounce of meat, chicken, or fish, 1 egg, or 1/4 cup of tofu.  Eat some foods each day that contain healthy fats, such as avocado, nuts, seeds, and fish. Lifestyle   Check your blood glucose regularly.  Exercise at least 30 minutes 5 or more days each week, or as told by your health care provider.  Take medicines as told by your health care provider.  Do not use any products that contain nicotine or tobacco, such as cigarettes and e-cigarettes. If you need help quitting, ask your health care provider.  Work with a counselor or diabetes educator to identify strategies to manage stress and any emotional and social challenges. What are some questions to ask my health care provider?  Do I need to meet with a diabetes educator?  Do I need to meet with a dietitian?  What number can I call if I have questions?  When are the best times to check my blood glucose? Where to find more information:  American Diabetes Association: diabetes.org/food-and-fitness/food  Academy of Nutrition and Dietetics:  www.eatright.org/resources/health/diseases-and-conditions/diabetes  National Institute of Diabetes and Digestive and Kidney Diseases (NIH): www.niddk.nih.gov/health-information/diabetes/overview/diet-eating-physical-activity Summary  A healthy meal plan will help you control your blood glucose and maintain a healthy lifestyle.  Working with a diet and nutrition specialist (dietitian) can help you make a meal plan that is best for you.  Keep in mind that carbohydrates and alcohol have immediate effects on your blood glucose levels. It is important to count carbohydrates and to use alcohol carefully. This information is not intended to replace advice given to you by your health care provider. Make sure you discuss any questions you have with your health care provider. Document Released: 10/06/2004 Document Revised: 02/14/2016 Document Reviewed: 02/14/2016 Elsevier Interactive Patient Education  2018 [employer] Inc.  

## 2017-03-20 NOTE — Progress Notes (Signed)
Subjective:    Patient ID: Carrie Cook, female    DOB: [birth year], 57 y.o.   MRN: [id number]   Diabetes   She has type 2 diabetes mellitus. Hypoglycemia symptoms include mood changes. Pertinent negatives for hypoglycemia include no confusion. Associated symptoms include blurred vision. Pertinent negatives for diabetes include no chest pain, no fatigue, no polydipsia, no polyphagia and no weakness. (Blurred vision before time to eat sometimes(states this is normal for her)) She is compliant with treatment all of the time. She  has not had a previous visit with a dietitian. She  rarely participates in exercise. She  does not see a podiatrist. Eye exam is not current.  Patient is diabetic she is doing the best she can and keeping her numbers looking good without checking her glucoses she is watching her diet unable to exercise on a regular basis because of orthopedic issues she hopes to have a knee surgery in the near future. She denies any chest tightness pressure pain or shortness of breath.  Is able to do some mild walking  Patient for blood pressure check up. Patient relates compliance with meds. Todays BP reviewed with the patient. Patient denies issues with medication. Patient relates reasonable diet. Patient tries to minimize salt. Patient aware of BP goals.  Patient here for follow-up regarding cholesterol.  Patient does try to maintain a reasonable diet.  Patient does take the medication on a regular basis.  Denies missing a dose.  The patient denies any obvious side effects.  Prior blood work results reviewed with the patient.  The patient is aware of his cholesterol goals and the need to keep it under good control to lessen the risk of disease.    Review of Systems  Constitutional: Negative for activity change, appetite change and fatigue.  HENT: Negative for congestion.   Eyes: Positive for blurred vision.  Respiratory: Negative for cough.   Cardiovascular: Negative for chest  pain.  Gastrointestinal: Negative for abdominal pain.  Endocrine: Negative for polydipsia and polyphagia.  Skin: Negative for color change.  Neurological: Negative for weakness.  Psychiatric/Behavioral: Negative for confusion.       Objective:   Physical Exam  Constitutional: She appears well-developed and well-nourished. No distress.  HENT:  Head: Normocephalic and atraumatic.  Eyes: Right eye exhibits no discharge. Left eye exhibits no discharge.  Neck: No tracheal deviation present.  Cardiovascular: Normal rate, regular rhythm and normal heart sounds.  No murmur heard. Pulmonary/Chest: Effort normal and breath sounds normal. No respiratory distress. She has no wheezes. She has no rales.  Musculoskeletal: She exhibits no edema.  Lymphadenopathy:    She has no cervical adenopathy.  Neurological: She is alert. She exhibits normal muscle tone.  Skin: Skin is warm and dry. No erythema.  Psychiatric: Her behavior is normal.  Vitals reviewed.         Assessment & Plan:  The patient was seen today as part of a comprehensive visit for diabetes. The importance of keeping her A1c at or below 7 was discussed. Importance of regular physical activity was discussed. Proper monitoring of glucose levels with glucometer discussed. The importance of adherence to medication as well as a controlled low starch/sugar diet was also discussed. Also discussion regarding the importance of diabetic foot checks including self check every day. Also yearly diabetic eye exams recommended. The importance of keeping blood pressure under control and keeping LDL below 70 was also discussed. Also the importance of avoiding smoking. Standard follow-up visit recommended. Finally  failure to follow good diabetic measures including self effort and compliance with recommendations can certainly increase the risk of heart disease strokes kidney failure blindness loss of limb and early death was discussed with the  patient. A1c looks good lab work ordered  HTN- Patient was seen today as part of a visit regarding hypertension. The importance of healthy diet and regular physical activity was discussed. The importance of compliance with medications discussed. Ideal goal is to keep blood pressure low elevated levels certainly below 725/36 when possible. The patient was counseled that keeping blood pressure under control lessen his risk of heart attack, stroke, kidney failure, and early death. The importance of regular follow-ups was discussed with the patient. Low-salt diet such as DASH recommended. Regular physical activity was recommended as well. Patient was advised to keep regular follow-ups.  The patient was seen today as part of an evaluation regarding hyperlipidemia. Recent lab work has been reviewed with the patient as well as the goals for good cholesterol care. In addition to this medications have been discussed the importance of compliance with diet and medications discussed as well. Patient has been informed of potential side effects of medications in the importance to notify us should any problems occur. Finally the patient is aware that poor control of cholesterol, noncompliance can dramatically increase her risk of heart attack strokes and premature death. The patient will keep regular office visits and the patient does agreed to periodic lab work.  The patient's BMI is calculated.  It is in the vital signs and acknowledged.  It is above the recommended BMI for the patient's height and weight.  The patient has been counseled regarding healthy diet, restricted portions, avoiding excessive carbohydrates/sugary foods, and increase physical activity as health permits.  It is in the patient's best interest to lower the risk of secondary illness including heart disease strokes and cancer by losing weight.  The patient acknowledges this information.  25 minutes was spent with the patient.  This statement verifies  that 25 minutes was indeed spent with the patient. Greater than half the time was spent in discussion, counseling and answering questions  regarding the issues that the patient came in for today as reflected in the diagnosis (s) please refer to documentation for further details.

## 2017-03-26 ENCOUNTER — Encounter: Payer: Self-pay | Admitting: Obstetrics & Gynecology

## 2017-03-26 ENCOUNTER — Ambulatory Visit (INDEPENDENT_AMBULATORY_CARE_PROVIDER_SITE_OTHER): Payer: Medicare Other | Admitting: Obstetrics & Gynecology

## 2017-03-26 VITALS — BP 140/80 | HR 95 | Ht 63.0 in | Wt 196.0 lb

## 2017-03-26 DIAGNOSIS — D219 Benign neoplasm of connective and other soft tissue, unspecified: Secondary | ICD-10-CM

## 2017-03-26 DIAGNOSIS — R102 Pelvic and perineal pain: Secondary | ICD-10-CM

## 2017-03-26 DIAGNOSIS — Z113 Encounter for screening for infections with a predominantly sexual mode of transmission: Secondary | ICD-10-CM

## 2017-03-26 MED ORDER — MEDROXYPROGESTERONE ACETATE 10 MG PO TABS
10.0000 mg | ORAL_TABLET | Freq: Every day | ORAL | 11 refills | Status: DC
Start: 2017-03-26 — End: 2017-06-07

## 2017-03-26 MED ORDER — HYDROCORTISONE 1 % RE CREA: 1.0000 "application " | TOPICAL_CREAM | Freq: Two times a day (BID) | RECTAL | 11 refills | Status: DC

## 2017-03-26 MED ORDER — KETOROLAC TROMETHAMINE 10 MG PO TABS: 10.0000 mg | ORAL_TABLET | Freq: Three times a day (TID) | ORAL | 0 refills | Status: DC | PRN

## 2017-03-26 NOTE — Progress Notes (Signed)
Chief Complaint  Patient presents with  . pelvic pain/ cramping    no bleeding      57 y.o. G1P1 Patient's last menstrual period was 07/10/2013. The current method of family planning is tubal ligation.  Outpatient Encounter Medications as of 03/26/2017  Medication Sig  . albuterol (PROVENTIL HFA;VENTOLIN HFA) 108 (90 Base) MCG/ACT inhaler Inhale 2 puffs into the lungs every 6 (six) hours as needed for wheezing.  Marland Kitchen atorvastatin (LIPITOR) 10 MG tablet TAKE 1 TABLET(10 MG) BY MOUTH DAILY  . cetirizine (ZYRTEC) 10 MG tablet Take 10 mg by mouth daily.  Marland Kitchen estradiol (ESTRACE) 2 MG tablet TAKE 1 TABLET BY MOUTH DAILY  . Insulin Glargine (LANTUS SOLOSTAR) 100 UNIT/ML Solostar Pen Use 12 to 14 nightly may titrate up to 24 units  . lamoTRIgine (LAMICTAL) 100 MG tablet TAKE 1 TABLET BY MOUTH AT BEDTIME.  Marland Kitchen losartan (COZAAR) 100 MG tablet Take 1 tablet (100 mg total) by mouth daily.  . metFORMIN (GLUCOPHAGE) 500 MG tablet TAKE 1 TABLET BY MOUTH TWICE DAILY WITH A MEAL  . metoprolol tartrate (LOPRESSOR) 25 MG tablet Take 1 tablet (25 mg total) by mouth 2 (two) times daily.  . Multiple Vitamin (MULTIVITAMIN WITH MINERALS) TABS tablet Take 2 tablets by mouth daily.   Marland Kitchen venlafaxine (EFFEXOR) 75 MG tablet Take 75 mg by mouth 2 (two) times daily with a meal.   . vitamin B-12 (CYANOCOBALAMIN) 1000 MCG tablet Take 1,000 mcg by mouth daily.  . fluticasone (FLOVENT HFA) 220 MCG/ACT inhaler Inhale 2 puffs into the lungs 2 (two) times daily.  . hydrocortisone (PROCTO-PAK) 1 % CREA Apply 1 application topically 2 (two) times daily.  Marland Kitchen ketorolac (TORADOL) 10 MG tablet Take 1 tablet (10 mg total) by mouth every 8 (eight) hours as needed.  . medroxyPROGESTERone (PROVERA) 10 MG tablet Take 1 tablet (10 mg total) by mouth daily.  . progesterone (PROMETRIUM) 200 MG capsule TAKE 1 CAPSULE BY MOUTH EVERY EVENING (Patient not taking: Reported on 03/20/2017)   No facility-administered encounter medications on file  as of 03/26/2017.     Subjective Carrie Cook presents to the office today complaining of midline pelvic pain No bleeding She had endometrial ablation performed by me 3 years ago However one thing she did was stop taking her progesterone which she needs to take at least cyclically because she is own estrogen She does have a history of fibroids and her pain is crampy sometimes sharp nothing really makes it better or worse is been going on now for the last 2-3 months or so She has not been taking any medication for except over-the-counter ibuprofen Past Medical History:  Diagnosis Date  . Anemia    PT HAS HAD COLONOSCOPY AND ENDOSCOPY WORK UP - NO PROBLEMS FOUND AS SOURCE OF ANEMIA -- PT HAD IRON INFUSION AT [facility] 11/13/12-AFTER SEEING HEMATOLOGIST DR. Zearing AND HE GAVE HEMATOLOGIC CLEARANCE FOR GASTRIC BYPASS SURGERY.  Marland Kitchen Anxiety   . Asthma    daily and prn inhalers  . Degenerative joint disease    right knee, spine - STATES INTERMITTENT  NUMBNESS DOWN RT LEG WITH PROLONGED STANDING OR WALKING- THINKS RELATED TO HER SPINE PROBLEMS  . Dental crowns present   . Depression   . Family history of anesthesia complication    pt's mother and sister have hx. of post-op N/V  . Fibroids    UTERINE  . Gastroesophageal reflux disease    none for 2 years since Bariatric surgery.  Marland Kitchen  H/O hiatal hernia   . History of endometriosis   . Hyperlipidemia    no current med.  . Hypertension    under control with med., has been on med. x 2 yr.  . Insulin dependent diabetes mellitus ([place])   . Lock jaw    jaw locks open if opens mouth wide  . Migraines   . Neuropathy    FEET  . Obesity   . Palpitations   . PONV (postoperative nausea and vomiting)   . Shortness of breath    with daily activities  . Sleep apnea    hasnt had to use CPAP in 2 years since she lost weight    Past Surgical History:  Procedure Laterality Date  . BREATH TEK H PYLORI N/A 08/13/2012   Procedure: BREATH TEK H  PYLORI;  Surgeon: Edward Jolly, MD;  Location: [facility];  Service: General;  Laterality: N/A;  . CARPAL TUNNEL RELEASE  01/03/2012   Procedure: CARPAL TUNNEL RELEASE;  Surgeon: Wynonia Sours, MD;  Location: [place];  Service: Orthopedics;  Laterality: Right;  . carpal tunnel release right hand Right 12/13  . COLONOSCOPY  05/2009   [id number] hemorrhoids otherwise normal colon, rectum and terminal ileum  . COLONOSCOPY WITH ESOPHAGOGASTRODUODENOSCOPY (EGD) N/A 10/28/2012   Procedure: COLONOSCOPY WITH ESOPHAGOGASTRODUODENOSCOPY (EGD);  Surgeon: Daneil Dolin, MD;  [facility];  Service: Endoscopy;  Laterality: N/A;  7:30  . DILITATION & CURRETTAGE/HYSTROSCOPY WITH NOVASURE ABLATION N/A 07/22/2014   Procedure: DILATATION & CURETTAGE/HYSTEROSCOPY WITH NOVASURE ABLATION; uterine length 6.0 cm; uterine width 3.8 cm; total ablation time 1 minute 15 seconds;  Surgeon: Florian Buff, MD;  [facility]: [facility];  Service: Gynecology;  Laterality: N/A;  . ESOPHAGOGASTRODUODENOSCOPY  5/013/2011   [id number] esophagus/multiple polyps removed s/p (hyperplastic). Gastritis without H.pylori.  Marland Kitchen GASTRIC ROUX-EN-Y N/A 11/19/2012   Procedure: LAPAROSCOPIC ROUX-EN-Y GASTRIC;  Surgeon: Edward Jolly, MD;  Location: [facility];  Service: General;  Laterality: N/A;  . GIVENS CAPSULE STUDY N/A 10/28/2012   Procedure: GIVENS CAPSULE STUDY;  Surgeon: Daneil Dolin, MD;  [facility];  Service: Endoscopy;  Laterality: N/A;  . KNEE ARTHROSCOPY WITH MEDIAL MENISECTOMY Right 10/28/2015   Procedure: RIGHT KNEE ARTHROSCOPY WITH MEDIAL MENISECTOMY;  Surgeon: Carole Civil, MD;  Location: [facility];  Service: Orthopedics;  Laterality: Right;  . LAPAROSCOPY     due to endometriosis  . PELVIC LAPAROSCOPY  11/04/1999   with fulguration of endometriosis  . TRIGGER FINGER RELEASE Right 09/24/2012   Procedure: RELEASE A-1 PULLEY OF RIGHT THUMB;  Surgeon: Wynonia Sours, MD;  Location:  [place];  Service: Orthopedics;  Laterality: Right;  . TUBAL LIGATION  1993    OB History    Gravida Para Term Preterm AB Living   1 1           SAB TAB Ectopic Multiple Live Births           1      Allergies  Allergen Reactions  . Qvar [Beclomethasone] Shortness Of Breath    Patient states that this medication causes her to feel short of breath  . Codeine Nausea And Vomiting  . Prozac [Fluoxetine Hcl] Hives  . Dyazide [Hydrochlorothiazide W-Triamterene] Cough  . Erythromycin Nausea Only  . Lisinopril Cough  . Pravastatin Other (See Comments)    BODY ACHES, FLU-LIKE SX.  . Sulfonamide Derivatives Rash    Social History   Socioeconomic History  .  Marital status: Married    Spouse name: None  . Number of children: 1  . Years of education: None  . Highest education level: None  Social Needs  . Financial resource strain: None  . Food insecurity - worry: None  . Food insecurity - inability: None  . Transportation needs - medical: None  . Transportation needs - non-medical: None  Occupational History  . Occupation: Application for disability pending  Tobacco Use  . Smoking status: Never Smoker  . Smokeless tobacco: Never Used  Substance and Sexual Activity  . Alcohol use: No  . Drug use: No  . Sexual activity: No    Birth control/protection: Surgical  Other Topics Concern  . None  Social History Narrative  . None    Family History  Problem Relation Age of Onset  . Hypertension Father   . Hyperlipidemia Father   . COPD Father   . COPD Mother   . Heart disease Mother   . Cancer Mother        Cervix  . Anesthesia problems Mother        post-op N/V  . Diabetes Maternal Grandfather   . Thyroid disease Sister   . Anesthesia problems Sister        post-op N/V  . Thyroid disease Paternal Uncle   . Diabetes Other     Medications:       Current Outpatient Medications:  .  albuterol (PROVENTIL HFA;VENTOLIN HFA) 108 (90 Base) MCG/ACT  inhaler, Inhale 2 puffs into the lungs every 6 (six) hours as needed for wheezing., Disp: 1 Inhaler, Rfl: 6 .  atorvastatin (LIPITOR) 10 MG tablet, TAKE 1 TABLET(10 MG) BY MOUTH DAILY, Disp: 90 tablet, Rfl: 1 .  cetirizine (ZYRTEC) 10 MG tablet, Take 10 mg by mouth daily., Disp: , Rfl:  .  estradiol (ESTRACE) 2 MG tablet, TAKE 1 TABLET BY MOUTH DAILY, Disp: 90 tablet, Rfl: 3 .  Insulin Glargine (LANTUS SOLOSTAR) 100 UNIT/ML Solostar Pen, Use 12 to 14 nightly may titrate up to 24 units, Disp: 5 pen, Rfl: 5 .  lamoTRIgine (LAMICTAL) 100 MG tablet, TAKE 1 TABLET BY MOUTH AT BEDTIME., Disp: , Rfl: 3 .  losartan (COZAAR) 100 MG tablet, Take 1 tablet (100 mg total) by mouth daily., Disp: 90 tablet, Rfl: 1 .  metFORMIN (GLUCOPHAGE) 500 MG tablet, TAKE 1 TABLET BY MOUTH TWICE DAILY WITH A MEAL, Disp: 180 tablet, Rfl: 1 .  metoprolol tartrate (LOPRESSOR) 25 MG tablet, Take 1 tablet (25 mg total) by mouth 2 (two) times daily., Disp: 180 tablet, Rfl: 1 .  Multiple Vitamin (MULTIVITAMIN WITH MINERALS) TABS tablet, Take 2 tablets by mouth daily. , Disp: , Rfl:  .  venlafaxine (EFFEXOR) 75 MG tablet, Take 75 mg by mouth 2 (two) times daily with a meal. , Disp: , Rfl: 1 .  vitamin B-12 (CYANOCOBALAMIN) 1000 MCG tablet, Take 1,000 mcg by mouth daily., Disp: , Rfl:  .  fluticasone (FLOVENT HFA) 220 MCG/ACT inhaler, Inhale 2 puffs into the lungs 2 (two) times daily., Disp: 1 Inhaler, Rfl: 12 .  hydrocortisone (ANUSOL-HC) 2.5 % rectal cream, Place 1 application rectally 2 (two) times daily., Disp: 30 g, Rfl: 11 .  hydrocortisone (PROCTO-PAK) 1 % CREA, Apply 1 application topically 2 (two) times daily., Disp: 1 Tube, Rfl: 11 .  ketorolac (TORADOL) 10 MG tablet, Take 1 tablet (10 mg total) by mouth every 8 (eight) hours as needed., Disp: 15 tablet, Rfl: 0 .  medroxyPROGESTERone (PROVERA) 10 MG tablet,  Take 1 tablet (10 mg total) by mouth daily., Disp: 30 tablet, Rfl: 11 .  progesterone (PROMETRIUM) 200 MG capsule, TAKE  1 CAPSULE BY MOUTH EVERY EVENING (Patient not taking: Reported on 03/20/2017), Disp: 30 capsule, Rfl: 11  Objective Blood pressure 140/80, pulse 95, height 5\' 3"  (1.6 m), weight 196 lb (88.9 kg), last menstrual period 07/10/2013.  General WDWN female NAD Vulva:  normal appearing vulva with no masses, tenderness or lesions Vagina:  normal mucosa, no discharge Cervix:  no bleeding following Pap, no cervical motion tenderness and no lesions Uterus:  normal size, contour, position, consistency, mobility, non-tender Adnexa: ovaries:present,  normal adnexa in size, nontender and no masses   Pertinent ROS No burning with urination, frequency or urgency No nausea, vomiting or diarrhea Nor fever chills or other constitutional symptoms   Labs or studies     Impression Diagnoses this Encounter::   ICD-10-CM   1. Pelvic pain R10.2 US Pelvis Complete    US PELVIS TRANSVANGINAL NON-OB (TV ONLY)  2. Screening examination for STD (sexually transmitted disease) Z11.3 GC/Chlamydia Probe Amp  3. Fibroids D21.9 US Pelvis Complete    US PELVIS TRANSVANGINAL NON-OB (TV ONLY)    Established relevant diagnosis(es): Status post endometrial ablation  Plan/Recommendations: Meds ordered this encounter  Medications  . ketorolac (TORADOL) 10 MG tablet    Sig: Take 1 tablet (10 mg total) by mouth every 8 (eight) hours as needed.    Dispense:  15 tablet    Refill:  0  . medroxyPROGESTERone (PROVERA) 10 MG tablet    Sig: Take 1 tablet (10 mg total) by mouth daily.    Dispense:  30 tablet    Refill:  11  . hydrocortisone (PROCTO-PAK) 1 % CREA    Sig: Apply 1 application topically 2 (two) times daily.    Dispense:  1 Tube    Refill:  11    Labs or Scans Ordered: Orders Placed This Encounter  Procedures  . GC/Chlamydia Probe Amp  . US Pelvis Complete  . US PELVIS TRANSVANGINAL NON-OB (TV ONLY)    Management:: Patient is placed on Provera she had not been taking it as she was supposed to  since she is on estrogen she has been on unopposed estrogen for some time We will do a sonogram to reevaluate her fibroids and cultures obtained today This could all be degenerating fibroids  Follow up Return in about 2 weeks (around 04/09/2017) for GYN sono, Follow up, with Dr Elonda Husky.      All questions were answered.

## 2017-03-28 ENCOUNTER — Telehealth: Payer: Self-pay | Admitting: Obstetrics & Gynecology

## 2017-03-28 ENCOUNTER — Telehealth: Payer: Self-pay | Admitting: *Deleted

## 2017-03-28 LAB — GC/CHLAMYDIA PROBE AMP
Chlamydia trachomatis, NAA: NEGATIVE
Neisseria gonorrhoeae by PCR: NEGATIVE

## 2017-03-28 MED ORDER — HYDROCORTISONE 2.5 % RE CREA: 1.0000 "application " | TOPICAL_CREAM | Freq: Two times a day (BID) | RECTAL | 11 refills | Status: DC

## 2017-03-28 NOTE — Telephone Encounter (Signed)
Meds ordered this encounter  Medications  . hydrocortisone (ANUSOL-HC) 2.5 % rectal cream    Sig: Place 1 application rectally 2 (two) times daily.    Dispense:  30 g    Refill:  11

## 2017-03-28 NOTE — Telephone Encounter (Signed)
Hydrocortisone 1% rectal cream not available. Alternate is HC 2.5% if appropriate. Please advise.

## 2017-04-10 ENCOUNTER — Ambulatory Visit (INDEPENDENT_AMBULATORY_CARE_PROVIDER_SITE_OTHER): Payer: Medicare Other | Admitting: Obstetrics & Gynecology

## 2017-04-10 ENCOUNTER — Encounter: Payer: Self-pay | Admitting: Obstetrics & Gynecology

## 2017-04-10 ENCOUNTER — Ambulatory Visit (INDEPENDENT_AMBULATORY_CARE_PROVIDER_SITE_OTHER): Payer: Medicare Other

## 2017-04-10 VITALS — BP 126/64 | HR 80 | Ht 63.0 in | Wt 196.0 lb

## 2017-04-10 DIAGNOSIS — D252 Subserosal leiomyoma of uterus: Secondary | ICD-10-CM | POA: Diagnosis not present

## 2017-04-10 DIAGNOSIS — D219 Benign neoplasm of connective and other soft tissue, unspecified: Secondary | ICD-10-CM

## 2017-04-10 DIAGNOSIS — R102 Pelvic and perineal pain: Secondary | ICD-10-CM

## 2017-04-10 NOTE — Progress Notes (Signed)
PELVIC US TA/TV: enlarged heterogeneous anteverted uterus w/mult fibroids,(#1) posterior fundal left subserosal fibroid 7.9 x 5.4 x 6.8 cm,(#2)anterior fundal subserosal fibroid 2 x 2.4 x 1.4 cm,complex fluid filled endometrium,no color flow,EEC 3.2 mm,normal ovaries bilat,left ovary was seen on T/A images only,no pain during ultrasound,no free fluid

## 2017-04-10 NOTE — Progress Notes (Signed)
Follow up appointment for results  Chief Complaint  Patient presents with  . Follow-up    Blood pressure 126/64, pulse 80, height 5\' 3"  (1.6 m), weight 196 lb (88.9 kg), last menstrual period 07/10/2013.  GYNECOLOGIC SONOGRAM   Carrie Cook is a 57 y.o. G1P1 postmenopausal,she is here for a pelvic sonogram for pelvic pain with a history of fibroids.  Uterus                      12.7 x 10.7 x 9.7 cm, vol 692 ml, enlarged heterogeneous anteverted uterus w/mult fibroids  Endometrium          3.2 mm, symmetrical, complex fluid filled endometrium,no color flow  Right ovary             2.7 x 2.8 x 1.4 cm, wnl  Left ovary                2 x 2.2 x 2.1 cm, wnl  Fibroids:                 (#1) posterior fundal left subserosal fibroid 7.9 x 5.4 x 6.8 cm,(#2)anterior fundal subserosal fibroid 2 x 2.4 x 1.4 cm  Technician Comments:  PELVIC US TA/TV: enlarged heterogeneous anteverted uterus w/mult fibroids,(#1) posterior fundal left subserosal fibroid 7.9 x 5.4 x 6.8 cm,(#2)anterior fundal subserosal fibroid 2 x 2.4 x 1.4 cm,complex fluid filled endometrium,no color flow,EEC 3.2 mm,normal ovaries bilat,left ovary was seen on T/A images only,no pain during ultrasound,no free fluid     [employer] 04/10/2017 2:08 PM  Clinical Impression and recommendations:  I have reviewed the sonogram results above, combined with the patient's current clinical course, below are my impressions and any appropriate recommendations for management based on the sonographic findings.  Uterus with fibroids, stable from sonogram 3 years ago Endometrium normal, no hematometra Both ovaries are normal   Florian Buff 04/10/2017 2:34 PM    MEDS ordered this encounter: No orders of the defined types were placed in this encounter.   Orders for this encounter: No orders of the defined types were placed in this encounter.   Impression: Pelvic pain  Fibroids   Plan: NSAIDs Re eval  1 month If continues consider hysterectomy for degenerating myoma  Follow Up: No follow-ups on file.       Face to face time:  10 minutes  Greater than 50% of the visit time was spent in counseling and coordination of care with the patient.  The summary and outline of the counseling and care coordination is summarized in the note above.   All questions were answered.  Past Medical History:  Diagnosis Date  . Anemia    PT HAS HAD COLONOSCOPY AND ENDOSCOPY WORK UP - NO PROBLEMS FOUND AS SOURCE OF ANEMIA -- PT HAD IRON INFUSION AT [facility] 11/13/12-AFTER SEEING HEMATOLOGIST DR. Olivet AND HE GAVE HEMATOLOGIC CLEARANCE FOR GASTRIC BYPASS SURGERY.  Marland Kitchen Anxiety   . Asthma    daily and prn inhalers  . Degenerative joint disease    right knee, spine - STATES INTERMITTENT  NUMBNESS DOWN RT LEG WITH PROLONGED STANDING OR WALKING- THINKS RELATED TO HER SPINE PROBLEMS  . Dental crowns present   . Depression   . Family history of anesthesia complication    pt's mother and sister have hx. of post-op N/V  . Fibroids    UTERINE  . Gastroesophageal reflux disease    none for 2 years since Bariatric surgery.  Marland Kitchen  H/O hiatal hernia   . History of endometriosis   . Hyperlipidemia    no current med.  . Hypertension    under control with med., has been on med. x 2 yr.  . Insulin dependent diabetes mellitus ([place])   . Lock jaw    jaw locks open if opens mouth wide  . Migraines   . Neuropathy    FEET  . Obesity   . Palpitations   . PONV (postoperative nausea and vomiting)   . Shortness of breath    with daily activities  . Sleep apnea    hasnt had to use CPAP in 2 years since she lost weight    Past Surgical History:  Procedure Laterality Date  . ABDOMINAL HYSTERECTOMY N/A 06/06/2017   Procedure: HYSTERECTOMY ABDOMINAL;  Surgeon: Florian Buff, MD;  [facility];  Service: Gynecology;  Laterality: N/A;  . BREATH TEK H PYLORI N/A 08/13/2012   Procedure: BREATH TEK H PYLORI;   Surgeon: Edward Jolly, MD;  Location: [facility];  Service: General;  Laterality: N/A;  . CARPAL TUNNEL RELEASE  01/03/2012   Procedure: CARPAL TUNNEL RELEASE;  Surgeon: Wynonia Sours, MD;  Location: [place];  Service: Orthopedics;  Laterality: Right;  . carpal tunnel release right hand Right 12/13  . COLONOSCOPY  05/2009   [id number] hemorrhoids otherwise normal colon, rectum and terminal ileum  . COLONOSCOPY WITH ESOPHAGOGASTRODUODENOSCOPY (EGD) N/A 10/28/2012   Procedure: COLONOSCOPY WITH ESOPHAGOGASTRODUODENOSCOPY (EGD);  Surgeon: Daneil Dolin, MD;  [facility];  Service: Endoscopy;  Laterality: N/A;  7:30  . DILITATION & CURRETTAGE/HYSTROSCOPY WITH NOVASURE ABLATION N/A 07/22/2014   Procedure: DILATATION & CURETTAGE/HYSTEROSCOPY WITH NOVASURE ABLATION; uterine length 6.0 cm; uterine width 3.8 cm; total ablation time 1 minute 15 seconds;  Surgeon: Florian Buff, MD;  [facility];  Service: Gynecology;  Laterality: N/A;  . ESOPHAGOGASTRODUODENOSCOPY  5/013/2011   QBH:[id number] esophagus/multiple polyps removed s/p (hyperplastic). Gastritis without H.pylori.  Marland Kitchen GASTRIC ROUX-EN-Y N/A 11/19/2012   Procedure: LAPAROSCOPIC ROUX-EN-Y GASTRIC;  Surgeon: Edward Jolly, MD;  [facility];  Service: General;  Laterality: N/A;  . GIVENS CAPSULE STUDY N/A 10/28/2012   Procedure: GIVENS CAPSULE STUDY;  Surgeon: Daneil Dolin, MD;  [facility];  Service: Endoscopy;  Laterality: N/A;  . KNEE ARTHROSCOPY WITH MEDIAL MENISECTOMY Right 10/28/2015   Procedure: RIGHT KNEE ARTHROSCOPY WITH MEDIAL MENISECTOMY;  Surgeon: Carole Civil, MD;  [facility];  Service: Orthopedics;  Laterality: Right;  . LAPAROSCOPY     due to endometriosis  . PELVIC LAPAROSCOPY  11/04/1999   with fulguration of endometriosis  . SALPINGOOPHORECTOMY Bilateral 06/06/2017   Procedure: SALPINGO OOPHORECTOMY;  Surgeon: Florian Buff, MD;  [facility];  Service:  Gynecology;  Laterality: Bilateral;  . TRIGGER FINGER RELEASE Right 09/24/2012   Procedure: RELEASE A-1 PULLEY OF RIGHT THUMB;  Surgeon: Wynonia Sours, MD;  Location: [place];  Service: Orthopedics;  Laterality: Right;  . TUBAL LIGATION  1993    OB History    Gravida  1   Para  1   Term      Preterm      AB      Living        SAB      TAB      Ectopic      Multiple      Live Births  1  Allergies  Allergen Reactions  . Qvar [Beclomethasone] Shortness Of Breath    Patient states that this medication causes her to feel short of breath  . Codeine Nausea And Vomiting  . Prozac [Fluoxetine Hcl] Hives  . Dyazide [Hydrochlorothiazide W-Triamterene] Cough  . Erythromycin Nausea Only  . Lisinopril Cough  . Pravastatin Other (See Comments)    BODY ACHES, FLU-LIKE SX.  . Sulfonamide Derivatives Rash    Social History   Socioeconomic History  . Marital status: Married    Spouse name: Not on file  . Number of children: 1  . Years of education: Not on file  . Highest education level: Not on file  Occupational History  . Occupation: Application for disability pending  Social Needs  . Financial resource strain: Not on file  . Food insecurity:    Worry: Not on file    Inability: Not on file  . Transportation needs:    Medical: Not on file    Non-medical: Not on file  Tobacco Use  . Smoking status: Never Smoker  . Smokeless tobacco: Never Used  Substance and Sexual Activity  . Alcohol use: No  . Drug use: No  . Sexual activity: Not Currently    Birth control/protection: Surgical  Lifestyle  . Physical activity:    Days per week: Not on file    Minutes per session: Not on file  . Stress: Not on file  Relationships  . Social connections:    Talks on phone: Not on file    Gets together: Not on file    Attends religious service: Not on file    Active member of club or organization: Not on file    Attends meetings of clubs or  organizations: Not on file    Relationship status: Not on file  Other Topics Concern  . Not on file  Social History Narrative  . Not on file    Family History  Problem Relation Age of Onset  . Hypertension Father   . Hyperlipidemia Father   . COPD Father   . COPD Mother   . Heart disease Mother   . Cancer Mother        Cervix  . Anesthesia problems Mother        post-op N/V  . Thyroid disease Mother   . Diabetes Maternal Grandfather   . Thyroid disease Sister   . Anesthesia problems Sister        post-op N/V  . Thyroid disease Paternal Uncle   . Diabetes Other

## 2017-04-18 DIAGNOSIS — F339 Major depressive disorder, recurrent, unspecified: Secondary | ICD-10-CM | POA: Diagnosis not present

## 2017-05-11 ENCOUNTER — Emergency Department (HOSPITAL_COMMUNITY)
Admission: EM | Admit: 2017-05-11 | Discharge: 2017-05-11 | Disposition: A | Discharge status: Home / Self Care | Payer: Medicare Other | Attending: Emergency Medicine | Admitting: Emergency Medicine

## 2017-05-11 ENCOUNTER — Encounter (HOSPITAL_COMMUNITY): Payer: Self-pay

## 2017-05-11 ENCOUNTER — Emergency Department (HOSPITAL_COMMUNITY): Payer: Medicare Other

## 2017-05-11 DIAGNOSIS — Z794 Long term (current) use of insulin: Secondary | ICD-10-CM | POA: Diagnosis not present

## 2017-05-11 DIAGNOSIS — I10 Essential (primary) hypertension: Secondary | ICD-10-CM | POA: Insufficient documentation

## 2017-05-11 DIAGNOSIS — Z79899 Other long term (current) drug therapy: Secondary | ICD-10-CM | POA: Insufficient documentation

## 2017-05-11 DIAGNOSIS — R102 Pelvic and perineal pain: Secondary | ICD-10-CM

## 2017-05-11 DIAGNOSIS — E119 Type 2 diabetes mellitus without complications: Secondary | ICD-10-CM | POA: Insufficient documentation

## 2017-05-11 DIAGNOSIS — F329 Major depressive disorder, single episode, unspecified: Secondary | ICD-10-CM | POA: Insufficient documentation

## 2017-05-11 DIAGNOSIS — F419 Anxiety disorder, unspecified: Secondary | ICD-10-CM | POA: Diagnosis not present

## 2017-05-11 DIAGNOSIS — D259 Leiomyoma of uterus, unspecified: Secondary | ICD-10-CM | POA: Insufficient documentation

## 2017-05-11 DIAGNOSIS — D219 Benign neoplasm of connective and other soft tissue, unspecified: Secondary | ICD-10-CM

## 2017-05-11 DIAGNOSIS — J45909 Unspecified asthma, uncomplicated: Secondary | ICD-10-CM | POA: Insufficient documentation

## 2017-05-11 DIAGNOSIS — R1030 Lower abdominal pain, unspecified: Secondary | ICD-10-CM | POA: Diagnosis present

## 2017-05-11 DIAGNOSIS — Z9884 Bariatric surgery status: Secondary | ICD-10-CM | POA: Diagnosis not present

## 2017-05-11 LAB — COMPREHENSIVE METABOLIC PANEL
ALT: 19 U/L (ref 14–54)
AST: 22 U/L (ref 15–41)
Albumin: 4.2 g/dL (ref 3.5–5.0)
Alkaline Phosphatase: 129 U/L — ABNORMAL HIGH (ref 38–126)
Anion gap: 13 (ref 5–15)
BUN: 6 mg/dL (ref 6–20)
CO2: 20 mmol/L — ABNORMAL LOW (ref 22–32)
Calcium: 8.9 mg/dL (ref 8.9–10.3)
Chloride: 103 mmol/L (ref 101–111)
Creatinine, Ser: 0.79 mg/dL (ref 0.44–1.00)
GFR calc Af Amer: >60 mL/min (ref >60)
GFR calc non Af Amer: >60 mL/min (ref >60)
Glucose, Bld: 290 mg/dL — ABNORMAL HIGH (ref 65–99)
Potassium: 4.3 mmol/L (ref 3.5–5.1)
Sodium: 136 mmol/L (ref 135–145)
Total Bilirubin: 0.4 mg/dL (ref 0.3–1.2)
Total Protein: 7.2 g/dL (ref 6.5–8.1)

## 2017-05-11 LAB — URINALYSIS, ROUTINE W REFLEX MICROSCOPIC
Bilirubin Urine: NEGATIVE
Glucose, UA: 150 mg/dL — AB
Hgb urine dipstick: NEGATIVE
Ketones, ur: 5 mg/dL — AB
Leukocytes, UA: NEGATIVE
Nitrite: NEGATIVE
Protein, ur: NEGATIVE mg/dL
Specific Gravity, Urine: 1.012 (ref 1.005–1.030)
pH: 5 (ref 5.0–8.0)

## 2017-05-11 LAB — CBC
HCT: 45.1 % (ref 36.0–46.0)
Hemoglobin: 15.4 g/dL — ABNORMAL HIGH (ref 12.0–15.0)
MCH: 30.6 pg (ref 26.0–34.0)
MCHC: 34.1 g/dL (ref 30.0–36.0)
MCV: 89.5 fL (ref 78.0–100.0)
Platelets: 281 10*3/uL (ref 150–400)
RBC: 5.04 MIL/uL (ref 3.87–5.11)
RDW: 12 % (ref 11.5–15.5)
WBC: 11.2 10*3/uL — ABNORMAL HIGH (ref 4.0–10.5)

## 2017-05-11 LAB — LIPASE, BLOOD: Lipase: 23 U/L (ref 11–51)

## 2017-05-11 MED ORDER — ACETAMINOPHEN 500 MG PO TABS: 1000.0000 mg | ORAL_TABLET | Freq: Three times a day (TID) | ORAL | 0 refills | Status: DC | PRN

## 2017-05-11 MED ORDER — TRAMADOL HCL 50 MG PO TABS
50.0000 mg | ORAL_TABLET | Freq: Once | ORAL | Status: AC
  Administered 2017-05-11: 50 mg via ORAL
  Filled 2017-05-11: qty 1

## 2017-05-11 MED ORDER — TRAMADOL HCL 50 MG PO TABS: 50.0000 mg | ORAL_TABLET | Freq: Three times a day (TID) | ORAL | 0 refills | Status: DC | PRN

## 2017-05-11 NOTE — ED Triage Notes (Signed)
Pt reports lower abd pain for approx one year. Reports she has endometriosis and fibroids , But pain has increased in severity this past week. [facility] was advised to come to ED by his nurse. No vaginal bleeding

## 2017-05-11 NOTE — ED Provider Notes (Signed)
[facility] Provider Note   CSN: [id number] Arrival date & time: 05/11/17  1318     History   Chief Complaint Chief Complaint  Patient presents with  . Abdominal Pain    HPI Carrie Cook is a 57 y.o. female.   Abdominal Pain   This is a chronic problem. The current episode started more than 1 week ago. The problem occurs constantly. The problem has been gradually worsening. The pain is located in the suprapubic region. The pain is moderate. Nothing aggravates the symptoms. Nothing relieves the symptoms. Past workup does not include GI consult, ultrasound or surgery.    Past Medical History:  Diagnosis Date  . Anemia    PT HAS HAD COLONOSCOPY AND ENDOSCOPY WORK UP - NO PROBLEMS FOUND AS SOURCE OF ANEMIA -- PT HAD IRON INFUSION AT [facility] 11/13/12-AFTER SEEING HEMATOLOGIST DR. El Portal AND HE GAVE HEMATOLOGIC CLEARANCE FOR GASTRIC BYPASS SURGERY.  Marland Kitchen Anxiety   . Asthma    daily and prn inhalers  . Degenerative joint disease    right knee, spine - STATES INTERMITTENT  NUMBNESS DOWN RT LEG WITH PROLONGED STANDING OR WALKING- THINKS RELATED TO HER SPINE PROBLEMS  . Dental crowns present   . Depression   . Family history of anesthesia complication    pt's mother and sister have hx. of post-op N/V  . Fibroids    UTERINE  . Gastroesophageal reflux disease    none for 2 years since Bariatric surgery.  . H/O hiatal hernia   . History of endometriosis   . Hyperlipidemia    no current med.  . Hypertension    under control with med., has been on med. x 2 yr.  . Insulin dependent diabetes mellitus ([place])   . Lock jaw    jaw locks open if opens mouth wide  . Migraines   . Neuropathy    FEET  . Obesity   . Palpitations   . PONV (postoperative nausea and vomiting)   . Shortness of breath    with daily activities  . Sleep apnea    hasnt had to use CPAP in 2 years since she lost weight    Patient Active Problem List   Diagnosis Date Noted  .  Derangement of posterior horn of medial meniscus of right knee   . Right knee pain 09/10/2015  . Iron deficiency anemia 10/16/2013  . Fibroids 08/21/2013  . Excessive or frequent menstruation 06/19/2013  . Morbid obesity ([place]) 07/12/2012  . Obstructive sleep apnea 06/21/2012  . Chest pain   . Hyperlipidemia   . Type 2 diabetes mellitus ([place])   . Obesity   . Laboratory test   . Nephrolithiasis   . Asthma   . Endometriosis   . Hypertension   . Iron deficiency anemia 05/11/2009  . GERD 05/11/2009    Past Surgical History:  Procedure Laterality Date  . BREATH TEK H PYLORI N/A 08/13/2012   Procedure: BREATH TEK H PYLORI;  Surgeon: Edward Jolly, MD;  Location: [facility];  Service: General;  Laterality: N/A;  . CARPAL TUNNEL RELEASE  01/03/2012   Procedure: CARPAL TUNNEL RELEASE;  Surgeon: Wynonia Sours, MD;  Location: [facility];  Service: Orthopedics;  Laterality: Right;  . carpal tunnel release right hand Right 12/13  . COLONOSCOPY  05/2009   QIO:[id number] hemorrhoids otherwise normal colon, rectum and terminal ileum  . COLONOSCOPY WITH ESOPHAGOGASTRODUODENOSCOPY (EGD) N/A 10/28/2012   Procedure: COLONOSCOPY WITH ESOPHAGOGASTRODUODENOSCOPY (EGD);  Surgeon:  Daneil Dolin, MD;  Location: [facility];  Service: Endoscopy;  Laterality: N/A;  7:30  . DILITATION & CURRETTAGE/HYSTROSCOPY WITH NOVASURE ABLATION N/A 07/22/2014   Procedure: DILATATION & CURETTAGE/HYSTEROSCOPY WITH NOVASURE ABLATION; uterine length 6.0 cm; uterine width 3.8 cm; total ablation time 1 minute 15 seconds;  Surgeon: Florian Buff, MD;  Location: [facility];  Service: Gynecology;  Laterality: N/A;  . ESOPHAGOGASTRODUODENOSCOPY  5/013/2011   VVO:HYWVPX esophagus/multiple polyps removed s/p (hyperplastic). Gastritis without H.pylori.  Marland Kitchen GASTRIC ROUX-EN-Y N/A 11/19/2012   Procedure: LAPAROSCOPIC ROUX-EN-Y GASTRIC;  Surgeon: Edward Jolly, MD;  Location: [facility];  Service: General;  Laterality: N/A;   . GIVENS CAPSULE STUDY N/A 10/28/2012   Procedure: GIVENS CAPSULE STUDY;  Surgeon: Daneil Dolin, MD;  [facility];  Service: Endoscopy;  Laterality: N/A;  . KNEE ARTHROSCOPY WITH MEDIAL MENISECTOMY Right 10/28/2015   Procedure: RIGHT KNEE ARTHROSCOPY WITH MEDIAL MENISECTOMY;  Surgeon: Carole Civil, MD;  Location: [facility];  Service: Orthopedics;  Laterality: Right;  . LAPAROSCOPY     due to endometriosis  . PELVIC LAPAROSCOPY  11/04/1999   with fulguration of endometriosis  . TRIGGER FINGER RELEASE Right 09/24/2012   Procedure: RELEASE A-1 PULLEY OF RIGHT THUMB;  Surgeon: Wynonia Sours, MD;  Location: [facility];  Service: Orthopedics;  Laterality: Right;  . TUBAL LIGATION  1993     OB History    Gravida  1   Para  1   Term      Preterm      AB      Living        SAB      TAB      Ectopic      Multiple      Live Births  1            Home Medications    Prior to Admission medications   Medication Sig Start Date End Date Taking? Authorizing Provider  acetaminophen (TYLENOL) 500 MG tablet Take 2 tablets (1,000 mg total) by mouth every 8 (eight) hours as needed. 05/11/17   Ravi Tuccillo, Corene Cornea, MD  albuterol (PROVENTIL HFA;VENTOLIN HFA) 108 (90 Base) MCG/ACT inhaler Inhale 2 puffs into the lungs every 6 (six) hours as needed for wheezing. 03/20/17   Kathyrn Drown, MD  atorvastatin (LIPITOR) 10 MG tablet TAKE 1 TABLET(10 MG) BY MOUTH DAILY 03/20/17   Kathyrn Drown, MD  cetirizine (ZYRTEC) 10 MG tablet Take 10 mg by mouth daily.    [provider]  estradiol (ESTRACE) 2 MG tablet TAKE 1 TABLET BY MOUTH DAILY 07/22/16   Florian Buff, MD  fluticasone (FLOVENT HFA) 220 MCG/ACT inhaler Inhale 2 puffs into the lungs 2 (two) times daily. 03/20/17   Kathyrn Drown, MD  hydrocortisone (ANUSOL-HC) 2.5 % rectal cream Place 1 application rectally 2 (two) times daily. Patient not taking: Reported on 04/10/2017 03/28/17   Florian Buff, MD    hydrocortisone (PROCTO-PAK) 1 % CREA Apply 1 application topically 2 (two) times daily. Patient not taking: Reported on 04/10/2017 03/26/17   Florian Buff, MD  Insulin Glargine (LANTUS SOLOSTAR) 100 UNIT/ML Solostar Pen Use 12 to 14 nightly may titrate up to 24 units 03/20/17   Luking, Elayne Snare, MD  ketorolac (TORADOL) 10 MG tablet Take 1 tablet (10 mg total) by mouth every 8 (eight) hours as needed. Patient not taking: Reported on 04/10/2017 03/26/17   Florian Buff, MD  lamoTRIgine (LAMICTAL)  100 MG tablet TAKE 1 TABLET BY MOUTH AT BEDTIME. 08/11/14   [provider]  losartan (COZAAR) 100 MG tablet Take 1 tablet (100 mg total) by mouth daily. 03/20/17   Kathyrn Drown, MD  medroxyPROGESTERone (PROVERA) 10 MG tablet Take 1 tablet (10 mg total) by mouth daily. 03/26/17   Florian Buff, MD  metFORMIN (GLUCOPHAGE) 500 MG tablet TAKE 1 TABLET BY MOUTH TWICE DAILY WITH A MEAL 03/20/17   Kathyrn Drown, MD  metoprolol tartrate (LOPRESSOR) 25 MG tablet Take 1 tablet (25 mg total) by mouth 2 (two) times daily. 03/20/17   Kathyrn Drown, MD  Multiple Vitamin (MULTIVITAMIN WITH MINERALS) TABS tablet Take 2 tablets by mouth daily.     [provider]  progesterone (PROMETRIUM) 200 MG capsule TAKE 1 CAPSULE BY MOUTH EVERY EVENING Patient not taking: Reported on 03/20/2017 07/13/16   Florian Buff, MD  traMADol (ULTRAM) 50 MG tablet Take 1 tablet (50 mg total) by mouth every 8 (eight) hours as needed. 05/11/17   Jesilyn Easom, Corene Cornea, MD  venlafaxine (EFFEXOR) 75 MG tablet Take 75 mg by mouth 2 (two) times daily with a meal.  12/07/13   [provider]  vitamin B-12 (CYANOCOBALAMIN) 1000 MCG tablet Take 1,000 mcg by mouth daily.    [provider]    Family History Family History  Problem Relation Age of Onset  . Hypertension Father   . Hyperlipidemia Father   . COPD Father   . COPD Mother   . Heart disease Mother   . Cancer Mother        Cervix  . Anesthesia problems Mother         post-op N/V  . Diabetes Maternal Grandfather   . Thyroid disease Sister   . Anesthesia problems Sister        post-op N/V  . Thyroid disease Paternal Uncle   . Diabetes Other     Social History Social History   Tobacco Use  . Smoking status: Never Smoker  . Smokeless tobacco: Never Used  Substance Use Topics  . Alcohol use: No  . Drug use: No     Allergies   Qvar [beclomethasone]; Codeine; Prozac [fluoxetine hcl]; Dyazide [hydrochlorothiazide w-triamterene]; Erythromycin; Lisinopril; Pravastatin; and Sulfonamide derivatives   Review of Systems Review of Systems  Gastrointestinal: Positive for abdominal pain.  All other systems reviewed and are negative.    Physical Exam Updated Vital Signs BP (!) 156/99 (BP Location: Left Arm)   Pulse 82   Temp 98.4 F (36.9 C) (Oral)   Resp 20   Wt 88.5 kg (195 lb)   LMP 07/10/2013   SpO2 99%   BMI 34.54 kg/m   Physical Exam  Constitutional: She appears well-developed and well-nourished.  HENT:  Head: Normocephalic and atraumatic.  Eyes: Pupils are equal, round, and reactive to light. EOM are normal.  Neck: Normal range of motion.  Cardiovascular: Normal rate and regular rhythm.  Pulmonary/Chest: Effort normal. No stridor. No respiratory distress.  Abdominal: Soft. Bowel sounds are normal. She exhibits no distension. There is tenderness in the suprapubic area.  Neurological: She is alert.  Skin: Skin is warm and dry.  Nursing note and vitals reviewed.    ED Treatments / Results  Labs (all labs ordered are listed, but only abnormal results are displayed) Labs Reviewed  COMPREHENSIVE METABOLIC PANEL - Abnormal; Notable for the following components:      Result Value   CO2 20 (*)    Glucose,  Bld 290 (*)    Alkaline Phosphatase 129 (*)    All other components within normal limits  CBC - Abnormal; Notable for the following components:   WBC 11.2 (*)    Hemoglobin 15.4 (*)    All other components within normal  limits  URINALYSIS, ROUTINE W REFLEX MICROSCOPIC - Abnormal; Notable for the following components:   Glucose, UA 150 (*)    Ketones, ur 5 (*)    All other components within normal limits  LIPASE, BLOOD    EKG None  Radiology US Pelvic Complete W Transvaginal And Torsion R/o  Result Date: 05/11/2017 CLINICAL DATA:  Severe pelvic pain.  Lower abdominal pain. EXAM: TRANSABDOMINAL AND TRANSVAGINAL ULTRASOUND OF PELVIS TECHNIQUE: Both transabdominal and transvaginal ultrasound examinations of the pelvis were performed. Transabdominal technique was performed for global imaging of the pelvis including uterus, ovaries, adnexal regions, and pelvic cul-de-sac. It was necessary to proceed with endovaginal exam following the transabdominal exam to visualize the uterus and endometrium. COMPARISON:  04/10/2017. FINDINGS: Uterus Measurements: 13.7 x 9.3 x 9.4 cm. 8.1 cm mass consistent with fibroid noted along the posterior left uterus. Large complex nabothian cyst measuring 4.5 cm noted. Endometrium Thickness: 27 mm.  No focal abnormality visualized. Right ovary Measurements: 3.0 x 2.5 x 2.7 cm. Normal appearance/no adnexal mass. Left ovary Measurements: 3.0 x 2.8 x 3.1 cm. Normal appearance/no adnexal mass. Other findings No abnormal free fluid. IMPRESSION: 1.  8.1 cm fibroid.  Large 4.5 cm complex nabothian cyst. 2. Severely thickened endometrium and or endometrial complex fluid collection at 27 mm. Endometrial malignancy cannot be excluded. Gynecologic evaluation is suggested. Electronically Signed   By: Marcello Moores  Register   On: 05/11/2017 15:50    Procedures Procedures (including critical care time)  Medications Ordered in ED Medications  traMADol (ULTRAM) tablet 50 mg (50 mg Oral Given 05/11/17 1432)     Initial Impression / Assessment and Plan / ED Course  I have reviewed the triage vital signs and the nursing notes.  Pertinent labs & imaging results that were available during my care of the  patient were reviewed by me and considered in my medical decision making (see chart for details).     Suspect worsening pain from fibroid,. Changed pain meds, will follow up with gynecology. No indication for ct or further imaging at this time.  Final Clinical Impressions(s) / ED Diagnoses   Final diagnoses:  Pelvic pain  Fibroid    ED Discharge Orders        Ordered    traMADol (ULTRAM) 50 MG tablet  Every 8 hours PRN     05/11/17 1720    acetaminophen (TYLENOL) 500 MG tablet  Every 8 hours PRN     05/11/17 1720       Colinda Barth, Corene Cornea, MD 05/11/17 2309

## 2017-05-15 ENCOUNTER — Other Ambulatory Visit: Payer: Self-pay | Admitting: Obstetrics & Gynecology

## 2017-05-15 ENCOUNTER — Ambulatory Visit (INDEPENDENT_AMBULATORY_CARE_PROVIDER_SITE_OTHER): Payer: Medicare Other | Admitting: Obstetrics & Gynecology

## 2017-05-15 ENCOUNTER — Encounter: Payer: Self-pay | Admitting: Obstetrics & Gynecology

## 2017-05-15 VITALS — BP 120/80 | HR 96 | Ht 63.0 in | Wt 196.0 lb

## 2017-05-15 DIAGNOSIS — D259 Leiomyoma of uterus, unspecified: Secondary | ICD-10-CM

## 2017-05-15 DIAGNOSIS — D219 Benign neoplasm of connective and other soft tissue, unspecified: Secondary | ICD-10-CM

## 2017-05-15 DIAGNOSIS — N72 Inflammatory disease of cervix uteri: Secondary | ICD-10-CM | POA: Diagnosis not present

## 2017-05-15 DIAGNOSIS — R102 Pelvic and perineal pain: Secondary | ICD-10-CM

## 2017-05-15 DIAGNOSIS — N857 Hematometra: Secondary | ICD-10-CM

## 2017-05-15 NOTE — Progress Notes (Signed)
Chief Complaint  Patient presents with  . Vaginal Bleeding    Forestine Na Friday- cramping/start bleeding Saturday, still bleeding      57 y.o. G1P1 Patient's last menstrual period was 07/10/2013. The current method of family planning is post menopausal status.  Outpatient Encounter Medications as of 05/15/2017  Medication Sig  . albuterol (PROVENTIL HFA;VENTOLIN HFA) 108 (90 Base) MCG/ACT inhaler Inhale 2 puffs into the lungs every 6 (six) hours as needed for wheezing.  Marland Kitchen atorvastatin (LIPITOR) 10 MG tablet TAKE 1 TABLET(10 MG) BY MOUTH DAILY  . cetirizine (ZYRTEC) 10 MG tablet Take 10 mg by mouth daily.  Marland Kitchen estradiol (ESTRACE) 2 MG tablet TAKE 1 TABLET BY MOUTH DAILY  . fluticasone (FLOVENT HFA) 220 MCG/ACT inhaler Inhale 2 puffs into the lungs 2 (two) times daily.  . Insulin Glargine (LANTUS SOLOSTAR) 100 UNIT/ML Solostar Pen Use 12 to 14 nightly may titrate up to 24 units  . lamoTRIgine (LAMICTAL) 100 MG tablet TAKE 1 TABLET BY MOUTH AT BEDTIME.  Marland Kitchen losartan (COZAAR) 100 MG tablet Take 1 tablet (100 mg total) by mouth daily.  . medroxyPROGESTERone (PROVERA) 10 MG tablet Take 1 tablet (10 mg total) by mouth daily.  . metFORMIN (GLUCOPHAGE) 500 MG tablet TAKE 1 TABLET BY MOUTH TWICE DAILY WITH A MEAL  . metoprolol tartrate (LOPRESSOR) 25 MG tablet Take 1 tablet (25 mg total) by mouth 2 (two) times daily.  . Multiple Vitamin (MULTIVITAMIN WITH MINERALS) TABS tablet Take 2 tablets by mouth daily.   . traMADol (ULTRAM) 50 MG tablet Take 1 tablet (50 mg total) by mouth every 8 (eight) hours as needed.  . venlafaxine (EFFEXOR) 75 MG tablet Take 75 mg by mouth 2 (two) times daily with a meal.   . vitamin B-12 (CYANOCOBALAMIN) 1000 MCG tablet Take 1,000 mcg by mouth daily.  Marland Kitchen acetaminophen (TYLENOL) 500 MG tablet Take 2 tablets (1,000 mg total) by mouth every 8 (eight) hours as needed. (Patient not taking: Reported on 05/15/2017)  . hydrocortisone (ANUSOL-HC) 2.5 % rectal cream Place 1  application rectally 2 (two) times daily. (Patient not taking: Reported on 04/10/2017)  . hydrocortisone (PROCTO-PAK) 1 % CREA Apply 1 application topically 2 (two) times daily. (Patient not taking: Reported on 04/10/2017)  . ketorolac (TORADOL) 10 MG tablet Take 1 tablet (10 mg total) by mouth every 8 (eight) hours as needed. (Patient not taking: Reported on 04/10/2017)  . progesterone (PROMETRIUM) 200 MG capsule TAKE 1 CAPSULE BY MOUTH EVERY EVENING (Patient not taking: Reported on 03/20/2017)   No facility-administered encounter medications on file as of 05/15/2017.     Subjective Carrie Cook  Is in today as a consult from the emergency department at [facility] she presented with acute onset of lower abdominal pain And vaginal bleeding Workup included an ultrasound which reveals hematometra as well as a degenerating 8 cm fibroid since that time she is continued to have some bleeding And her pain has subsequently subsided acutely but it is still  There  she rates her daily pain at about a 4 but the pain last week was about 8-10  Past Medical History:  Diagnosis Date  . Anemia    PT HAS HAD COLONOSCOPY AND ENDOSCOPY WORK UP - NO PROBLEMS FOUND AS SOURCE OF ANEMIA -- PT HAD IRON INFUSION AT [facility] 11/13/12-AFTER SEEING HEMATOLOGIST DR. Manson AND HE GAVE HEMATOLOGIC CLEARANCE FOR GASTRIC BYPASS SURGERY.  Marland Kitchen Anxiety   . Asthma    daily and prn  inhalers  . Degenerative joint disease    right knee, spine - STATES INTERMITTENT  NUMBNESS DOWN RT LEG WITH PROLONGED STANDING OR WALKING- THINKS RELATED TO HER SPINE PROBLEMS  . Dental crowns present   . Depression   . Family history of anesthesia complication    pt's mother and sister have hx. of post-op N/V  . Fibroids    UTERINE  . Gastroesophageal reflux disease    none for 2 years since Bariatric surgery.  . H/O hiatal hernia   . History of endometriosis   . Hyperlipidemia    no current med.  . Hypertension    under control with  med., has been on med. x 2 yr.  . Insulin dependent diabetes mellitus ([place])   . Lock jaw    jaw locks open if opens mouth wide  . Migraines   . Neuropathy    FEET  . Obesity   . Palpitations   . PONV (postoperative nausea and vomiting)   . Shortness of breath    with daily activities  . Sleep apnea    hasnt had to use CPAP in 2 years since she lost weight    Past Surgical History:  Procedure Laterality Date  . BREATH TEK H PYLORI N/A 08/13/2012   Procedure: BREATH TEK H PYLORI;  Surgeon: Edward Jolly, MD;  Location: [facility];  Service: General;  Laterality: N/A;  . CARPAL TUNNEL RELEASE  01/03/2012   Procedure: CARPAL TUNNEL RELEASE;  Surgeon: Wynonia Sours, MD;  Location: [place];  Service: Orthopedics;  Laterality: Right;  . carpal tunnel release right hand Right 12/13  . COLONOSCOPY  05/2009   ONG:[id number] hemorrhoids otherwise normal colon, rectum and terminal ileum  . COLONOSCOPY WITH ESOPHAGOGASTRODUODENOSCOPY (EGD) N/A 10/28/2012   Procedure: COLONOSCOPY WITH ESOPHAGOGASTRODUODENOSCOPY (EGD);  Surgeon: Daneil Dolin, MD;  [facility];  Service: Endoscopy;  Laterality: N/A;  7:30  . DILITATION & CURRETTAGE/HYSTROSCOPY WITH NOVASURE ABLATION N/A 07/22/2014   Procedure: DILATATION & CURETTAGE/HYSTEROSCOPY WITH NOVASURE ABLATION; uterine length 6.0 cm; uterine width 3.8 cm; total ablation time 1 minute 15 seconds;  Surgeon: Florian Buff, MD;  Location: [facility];  Service: Gynecology;  Laterality: N/A;  . ESOPHAGOGASTRODUODENOSCOPY  5/013/2011   KGM:WNUUVO esophagus/multiple polyps removed s/p (hyperplastic). Gastritis without H.pylori.  Marland Kitchen GASTRIC ROUX-EN-Y N/A 11/19/2012   Procedure: LAPAROSCOPIC ROUX-EN-Y GASTRIC;  Surgeon: Edward Jolly, MD;  Location: [facility];  Service: General;  Laterality: N/A;  . GIVENS CAPSULE STUDY N/A 10/28/2012   Procedure: GIVENS CAPSULE STUDY;  Surgeon: Daneil Dolin, MD;  Location[facility];  Service:  Endoscopy;  Laterality: N/A;  . KNEE ARTHROSCOPY WITH MEDIAL MENISECTOMY Right 10/28/2015   Procedure: RIGHT KNEE ARTHROSCOPY WITH MEDIAL MENISECTOMY;  Surgeon: Carole Civil, MD;  Location: [facility];  Service: Orthopedics;  Laterality: Right;  . LAPAROSCOPY     due to endometriosis  . PELVIC LAPAROSCOPY  11/04/1999   with fulguration of endometriosis  . TRIGGER FINGER RELEASE Right 09/24/2012   Procedure: RELEASE A-1 PULLEY OF RIGHT THUMB;  Surgeon: Wynonia Sours, MD;  Location: [place];  Service: Orthopedics;  Laterality: Right;  . TUBAL LIGATION  1993    OB History    Gravida  1   Para  1   Term      Preterm      AB      Living        SAB  TAB      Ectopic      Multiple      Live Births  1           Allergies  Allergen Reactions  . Qvar [Beclomethasone] Shortness Of Breath    Patient states that this medication causes her to feel short of breath  . Codeine Nausea And Vomiting  . Prozac [Fluoxetine Hcl] Hives  . Dyazide [Hydrochlorothiazide W-Triamterene] Cough  . Erythromycin Nausea Only  . Lisinopril Cough  . Pravastatin Other (See Comments)    BODY ACHES, FLU-LIKE SX.  . Sulfonamide Derivatives Rash    Social History   Socioeconomic History  . Marital status: Married    Spouse name: Not on file  . Number of children: 1  . Years of education: Not on file  . Highest education level: Not on file  Occupational History  . Occupation: Application for disability pending  Social Needs  . Financial resource strain: Not on file  . Food insecurity:    Worry: Not on file    Inability: Not on file  . Transportation needs:    Medical: Not on file    Non-medical: Not on file  Tobacco Use  . Smoking status: Never Smoker  . Smokeless tobacco: Never Used  Substance and Sexual Activity  . Alcohol use: No  . Drug use: No  . Sexual activity: Never    Birth control/protection: Surgical  Lifestyle  . Physical activity:    Days per  week: Not on file    Minutes per session: Not on file  . Stress: Not on file  Relationships  . Social connections:    Talks on phone: Not on file    Gets together: Not on file    Attends religious service: Not on file    Active member of club or organization: Not on file    Attends meetings of clubs or organizations: Not on file    Relationship status: Not on file  Other Topics Concern  . Not on file  Social History Narrative  . Not on file    Family History  Problem Relation Age of Onset  . Hypertension Father   . Hyperlipidemia Father   . COPD Father   . COPD Mother   . Heart disease Mother   . Cancer Mother        Cervix  . Anesthesia problems Mother        post-op N/V  . Thyroid disease Mother   . Diabetes Maternal Grandfather   . Thyroid disease Sister   . Anesthesia problems Sister        post-op N/V  . Thyroid disease Paternal Uncle   . Diabetes Other     Medications:       Current Outpatient Medications:  .  albuterol (PROVENTIL HFA;VENTOLIN HFA) 108 (90 Base) MCG/ACT inhaler, Inhale 2 puffs into the lungs every 6 (six) hours as needed for wheezing., Disp: 1 Inhaler, Rfl: 6 .  atorvastatin (LIPITOR) 10 MG tablet, TAKE 1 TABLET(10 MG) BY MOUTH DAILY, Disp: 90 tablet, Rfl: 1 .  cetirizine (ZYRTEC) 10 MG tablet, Take 10 mg by mouth daily., Disp: , Rfl:  .  estradiol (ESTRACE) 2 MG tablet, TAKE 1 TABLET BY MOUTH DAILY, Disp: 90 tablet, Rfl: 3 .  fluticasone (FLOVENT HFA) 220 MCG/ACT inhaler, Inhale 2 puffs into the lungs 2 (two) times daily., Disp: 1 Inhaler, Rfl: 12 .  Insulin Glargine (LANTUS SOLOSTAR) 100 UNIT/ML Solostar Pen, Use 12 to 14  nightly may titrate up to 24 units, Disp: 5 pen, Rfl: 5 .  lamoTRIgine (LAMICTAL) 100 MG tablet, TAKE 1 TABLET BY MOUTH AT BEDTIME., Disp: , Rfl: 3 .  losartan (COZAAR) 100 MG tablet, Take 1 tablet (100 mg total) by mouth daily., Disp: 90 tablet, Rfl: 1 .  medroxyPROGESTERone (PROVERA) 10 MG tablet, Take 1 tablet (10 mg total)  by mouth daily., Disp: 30 tablet, Rfl: 11 .  metFORMIN (GLUCOPHAGE) 500 MG tablet, TAKE 1 TABLET BY MOUTH TWICE DAILY WITH A MEAL, Disp: 180 tablet, Rfl: 1 .  metoprolol tartrate (LOPRESSOR) 25 MG tablet, Take 1 tablet (25 mg total) by mouth 2 (two) times daily., Disp: 180 tablet, Rfl: 1 .  Multiple Vitamin (MULTIVITAMIN WITH MINERALS) TABS tablet, Take 2 tablets by mouth daily. , Disp: , Rfl:  .  traMADol (ULTRAM) 50 MG tablet, Take 1 tablet (50 mg total) by mouth every 8 (eight) hours as needed., Disp: 15 tablet, Rfl: 0 .  venlafaxine (EFFEXOR) 75 MG tablet, Take 75 mg by mouth 2 (two) times daily with a meal. , Disp: , Rfl: 1 .  vitamin B-12 (CYANOCOBALAMIN) 1000 MCG tablet, Take 1,000 mcg by mouth daily., Disp: , Rfl:  .  acetaminophen (TYLENOL) 500 MG tablet, Take 2 tablets (1,000 mg total) by mouth every 8 (eight) hours as needed. (Patient not taking: Reported on 05/15/2017), Disp: 30 tablet, Rfl: 0 .  hydrocortisone (ANUSOL-HC) 2.5 % rectal cream, Place 1 application rectally 2 (two) times daily. (Patient not taking: Reported on 04/10/2017), Disp: 30 g, Rfl: 11 .  hydrocortisone (PROCTO-PAK) 1 % CREA, Apply 1 application topically 2 (two) times daily. (Patient not taking: Reported on 04/10/2017), Disp: 1 Tube, Rfl: 11 .  ketorolac (TORADOL) 10 MG tablet, Take 1 tablet (10 mg total) by mouth every 8 (eight) hours as needed. (Patient not taking: Reported on 04/10/2017), Disp: 15 tablet, Rfl: 0 .  progesterone (PROMETRIUM) 200 MG capsule, TAKE 1 CAPSULE BY MOUTH EVERY EVENING (Patient not taking: Reported on 03/20/2017), Disp: 30 capsule, Rfl: 11  Objective Blood pressure 120/80, pulse 96, height 5\' 3"  (1.6 m), weight 196 lb (88.9 kg), last menstrual period 07/10/2013.  General WDWN female NAD Vulva:  normal appearing vulva with no masses, tenderness or lesions Vagina: Blood present in the vagina no lesions Cervix:  no cervical motion tenderness and no lesions Uterus: Endometrial biopsy was  performed after cervical dilation and a significant amount of dark present blood was coming through the cervix consistent with hematometra blood  Adnexa: ovaries:present,  normal adnexa in size, nontender and no masses   Pertinent ROS No burning with urination, frequency or urgency No nausea, vomiting or diarrhea Nor fever chills or other constitutional symptoms   Labs or studies  reviewed sonogram and labs from the hospital    Impression Diagnoses this Encounter::   ICD-10-CM   1. Fibroids D21.9   2. Hematometra N85.7 Endometrial biopsy  3. Pelvic pain R10.2     Established relevant diagnosis(es): Status post endometrial ablation 3+ years ago  Plan/Recommendations: No orders of the defined types were placed in this encounter.   Labs or Scans Ordered: Orders Placed This Encounter  Procedures  . Endometrial biopsy    Management:: Porsche has a long-standing myoma which we have defined prior to even do doing her endometrial ablation 3+ years ago unfortunately she is having increasing pain from it probably secondary to degeneration   her daily life is interrupted as a result of it, additionally with a hematometra  we discussed options including conservative management and she wants to proceed with definitive surgical management  She is scheduled for a abdominal hysterectomy with bilateral salpingo-oophorectomy on Jun 06, 2017  Follow up Return in about 1 month (around 06/14/2017) for Post Op.       All questions were answered.

## 2017-05-16 ENCOUNTER — Other Ambulatory Visit: Payer: Self-pay | Admitting: Family Medicine

## 2017-05-20 ENCOUNTER — Other Ambulatory Visit: Payer: Self-pay | Admitting: Family Medicine

## 2017-05-21 ENCOUNTER — Ambulatory Visit: Payer: Medicare Other | Admitting: Obstetrics & Gynecology

## 2017-05-29 NOTE — Patient Instructions (Signed)
Carrie Cook  05/29/2017     @PREFPERIOPPHARMACY @   Your procedure is scheduled on  06/06/2017 .  Report to [facility] at  1015   A.M.  Call this number if you have problems the morning of surgery:  [phone]   Remember:  Do not eat food or drink liquids after midnight.  Take these medicines the morning of surgery with A SIP OF WATER  Zyrtec, cozaar, metoprolol, ultram, effexor. Use your inhalers before you come and bring a rescue inhaler with you if you have one. Take 7 units of your Lantus the night before your procedure. DO NOT take any medications for diabetes the morning of your surgery.   Do not wear jewelry, make-up or nail polish.  Do not wear lotions, powders, or perfumes, or deodorant.  Do not shave 48 hours prior to surgery.  Men may shave face and neck.  Do not bring valuables to the hospital.  [facility] is not responsible for any belongings or valuables.  Contacts, dentures or bridgework may not be worn into surgery.  Leave your suitcase in the car.  After surgery it may be brought to your room.  For patients admitted to the hospital, discharge time will be determined by your treatment team.  Patients discharged the day of surgery will not be allowed to drive home.   Name and phone number of your driver:   family Special instructions:  None  Please read over the following fact sheets that you were given. Pain Booklet, Coughing and Deep Breathing, Blood Transfusion Information, MRSA Information, Surgical Site Infection Prevention, Anesthesia Post-op Instructions and Care and Recovery After Surgery       Abdominal Hysterectomy Abdominal hysterectomy is a surgery to remove your womb (uterus). The womb is the part of your body that holds a growing baby. You may need this procedure if:  You have cancer.  You have growths (tumors or fibroids) in your uterus.  You have long-term (chronic) pain.  You are bleeding.  Your womb has  slipped down into your vagina.  You have a condition in which the tissue that lines the womb grows outside of its normal place.  You have an infection in your womb.  You have problems with your period.  You may also need other reproductive parts removed. This will depend on why you need to have the surgery. What happens before the procedure? Staying hydrated Follow instructions from your doctor about hydration. This may include:  Up to 2 hours before the procedure - you may continue to drink clear liquids, such as: ? Water. ? Fruit juice. ? Black coffee. ? Plain tea.  Eating and drinking restrictions Follow instructions from your doctor about eating and drinking. These may include:  8 hours before the procedure - stop eating heavy meals or foods, such as: ? Meat. ? Fried foods. ? Fatty foods.  6 hours before the procedure - stop eating light meals or foods, such as: ? Toast. ? Cereal.  6 hours before the procedure - stop drinking: ? Milk ? Drinks that have milk in them.  2 hours before the procedure - stop drinking clear liquids.  Medicines  Ask your doctor about: ? Changing or stopping your normal medicines. This is important if you take diabetes medicines or blood thinners. ? Taking medicines such as aspirin and ibuprofen. These medicines can thin your blood. Do not take these medicines before  your procedure if your doctor tells you not to.  You may be given antibiotic medicine. This can help prevent infection.  You may be asked to take medicines that help you poop (laxatives). General instructions  Ask your doctor how your surgical site will be marked or identified.  You may be asked to shower with a germ-killing soap.  Plan to have someone take you home from the hospital.  Do not use any products that contain nicotine or tobacco, such as cigarettes and e-cigarettes. If you need help quitting, ask your doctor.  You may have an exam or tests done.  You may  have a blood or urine sample taken.  You may need to have an enema to clean out your rectum and lower colon.  Talk to your doctor about the changes this procedure may cause. These can be physical or emotional. What happens during the procedure?  To lower your risk of infection: ? Your health care team will wash or clean their hands. ? Your skin will be washed with soap. ? Hair may be removed from the surgical area.  An IV tube will be put into one of your veins.  You will be given one or more of the following: ? A medicine to help you relax (sedative). ? A medicine to make you fall asleep (general anesthetic).  Tight-fitting (compression) stockings will be placed on your legs to help with circulation.  A thin, flexible tube (catheter) will be inserted to help drain your urine.  The doctor will make a cut (incision) through the skin in your lower belly. It may go side-to-side or up-and-down.  The doctor will move the body tissues that cover your womb.  The doctor will remove your womb.  The doctor may take out any other parts that need to be removed.  The doctor will control the bleeding.  The doctor will close your cut with stitches (sutures), skin glue, or adhesive strips.  A bandage (dressing) will be placed over the cut. The procedure may vary among doctors and hospitals. What happens after the procedure?  You will be given pain medicine if you need it.  Your blood pressure, heart rate, breathing rate, and blood oxygen level will be watched until the medicines you were given have worn off.  You will need to stay in the hospital to recover. Ask your doctor how long you will need to stay in the hospital after your procedure.  You may have a liquid diet at first. You will most likely return to your usual diet the day after surgery.  You will still have the urinary catheter in place. It will likely be removed the day after surgery.  You may have to wear compression  stockings. These stockings help to prevent blood clots and reduce swelling in your legs.  You will be encouraged to walk as soon as possible. You will also use a device or do breathing exercises to keep your lungs clear.  You may need to use a sanitary pad for vaginal discharge. Summary  Abdominal hysterectomy is a surgery to remove your womb (uterus). The womb is the part of your body that holds a growing baby.  Talk to your doctor about the changes this procedure may cause. These can be physical or emotional.  You will be given pain medicine if you need it.  You will need to stay in the hospital to recover for one to two days. Ask your doctor how long you will need to stay  in the hospital after your procedure. This information is not intended to replace advice given to you by your health care provider. Make sure you discuss any questions you have with your health care provider. Document Released: 01/14/2013 Document Revised: 12/29/2015 Document Reviewed: 12/29/2015 Elsevier Interactive Patient Education  2017 [employer] [place] [employer].  Abdominal Hysterectomy, Care After This sheet gives you information about how to care for yourself after your procedure. Your doctor may also give you more specific instructions. If you have problems or questions, contact your doctor. Follow these instructions at home: Bathing  Do not take baths, swim, or use a hot tub until your doctor says it is okay. Ask your doctor if you can take showers. You may only be allowed to take sponge baths for bathing.  Keep the bandage (dressing) dry until your doctor says it can be taken off. Surgical cut ( incision) care  Follow instructions from your doctor about how to take care of your cut from surgery. Make sure you: ? Wash your hands with soap and water before you change your bandage (dressing). If you cannot use soap and water, use hand sanitizer. ? Change your bandage as told by your doctor. ? Leave stitches (sutures),  skin glue, or skin tape (adhesive) strips in place. They may need to stay in place for 2 weeks or longer. If tape strips get loose and curl up, you may trim the loose edges. Do not remove tape strips completely unless your doctor says it is okay.  Check your surgical cut area every day for signs of infection. Check for: ? Redness, swelling, or pain. ? Fluid or blood. ? Warmth. ? Pus or a bad smell. Activity  Do gentle, daily exercise as told by your doctor. You may be told to take short walks every day and go farther each time.  Do not lift anything that is heavier than 10 lb (4.5 kg), or the limit that your doctor tells you, until he or she says that it is safe.  Do not drive or use heavy machinery while taking prescription pain medicine.  Do not drive for 24 hours if you were given a medicine to help you relax (sedative).  Follow your doctor's advice about exercise, driving, and general activities. Ask your doctor what activities are safe for you. Lifestyle  Do not douche, use tampons, or have sex for at least 6 weeks or as told by your doctor.  Do not drink alcohol until your doctor says it is okay.  Drink enough fluid to keep your pee (urine) clear or pale yellow.  Try to have someone at home with you for the first 1-2 weeks to help.  Do not use any products that contain nicotine or tobacco, such as cigarettes and e-cigarettes. These can slow down healing. If you need help quitting, ask your doctor. General instructions  Take over-the-counter and prescription medicines only as told by your doctor.  Do not take aspirin or ibuprofen. These medicines can cause bleeding.  To prevent or treat constipation while you are taking prescription pain medicine, your doctor may suggest that you: ? Drink enough fluid to keep your urine clear or pale yellow. ? Take over-the-counter or prescription medicines. ? Eat foods that are high in fiber, such as:  Fresh fruits and  vegetables.  Whole grains.  Beans. ? Limit foods that are high in fat and processed sugars, such as fried and sweet foods.  Keep all follow-up visits as told by your doctor. This is important.  Contact a doctor if:  You have chills or fever.  You have redness, swelling, or pain around your cut.  You have fluid or blood coming from your cut.  Your cut feels warm to the touch.  You have pus or a bad smell coming from your cut.  Your cut breaks open.  You feel dizzy or light-headed.  You have pain or bleeding when you pee.  You keep having watery poop (diarrhea).  You keep feeling sick to your stomach (nauseous) or keep throwing up (vomiting).  You have unusual fluid (discharge) coming from your vagina.  You have a rash.  You have a reaction to your medicine.  Your pain medicine does not help. Get help right away if:  You have a fever and your symptoms get worse all of a sudden.  You have very bad belly (abdominal) pain.  You are short of breath.  You pass out (faint).  You have pain, swelling, or redness of your leg.  You bleed a lot from your vagina and notice clumps of blood (clots). Summary  Do not take baths, swim, or use a hot tub until your doctor says it is okay. Ask your doctor if you can take showers. You may only be allowed to take sponge baths for bathing.  Follow your doctor's advice about exercise, driving, and general activities. Ask your doctor what activities are safe for you.  Do not lift anything that is heavier than 10 lb (4.5 kg), or the limit that your doctor tells you, until he or she says that it is safe.  Try to have someone at home with you for the first 1-2 weeks to help. This information is not intended to replace advice given to you by your health care provider. Make sure you discuss any questions you have with your health care provider. Document Released: 10/19/2007 Document Revised: 12/29/2015 Document Reviewed:  12/29/2015 Elsevier Interactive Patient Education  2017 [employer]  Bilateral Salpingo-Oophorectomy Bilateral salpingo-oophorectomy is the surgical removal of both fallopian tubes and both ovaries. The ovaries are reproductive organs that produce eggs in women. The fallopian tubes allow eggs to move from the ovaries to the uterus. You may need this procedure if you:  Have had your uterus removed. This procedure is usually done after the uterus is removed.  Have cancer of the fallopian tubes or ovaries.  Have a high risk of cancer of the fallopian tubes or ovaries.  There are three different techniques that can be used for this procedure:  Open. One large incision will be made in your abdomen.  Laparoscopic. A thin, lighted tube with a small camera on the end (laparoscope) will be used to help perform the procedure. The laparoscope will allow your surgeon to make several small incisions in the abdomen instead of one large incision.  Robot-assisted. A computer will be used to control surgical instruments that are attached to robotic arms. A laparoscope may also be used with this technique.  As a result of this procedure, you will become sterile (unable to become pregnant), and you will go into menopause (no longer able to have menstrual periods). You may develop symptoms of menopause such as hot flashes, night sweats, and mood changes. Your sex drive may also be affected. Tell a health care provider about:  Any allergies you have.  All medicines you are taking, including vitamins, herbs, eye drops, creams, and over-the-counter medicines.  Any problems you or family members have had with anesthetic medicines.  Any blood  disorders you have.  Any surgeries you have had.  Any medical conditions you have.  Whether you are pregnant or may be pregnant. What are the risks? Generally, this is a safe procedure. However, problems may occur,  including:  Infection.  Bleeding.  Allergic reactions to medicines.  Damage to other structures or organs.  Blood clots in the legs or lungs.  What happens before the procedure? Staying hydrated Follow instructions from your health care provider about hydration, which may include:  Up to 2 hours before the procedure - you may continue to drink clear liquids, such as water, clear fruit juice, black coffee, and plain tea.  Eating and drinking restrictions Follow instructions from your health care provider about eating and drinking, which may include:  8 hours before the procedure - stop eating heavy meals or foods such as meat, fried foods, or fatty foods.  6 hours before the procedure - stop eating light meals or foods, such as toast or cereal.  6 hours before the procedure - stop drinking milk or drinks that contain milk.  2 hours before the procedure - stop drinking clear liquids.  Medicines  Ask your health care provider about: ? Changing or stopping your regular medicines. This is especially important if you are taking diabetes medicines or blood thinners. ? Taking medicines such as aspirin and ibuprofen. These medicines can thin your blood. Do not take these medicines before your procedure if your health care provider instructs you not to.  You may be given antibiotic medicine to help prevent infection. General instructions  Do not smoke for at least 2 weeks before your procedure or as told by your health care provider.  You may have an exam or testing.  You may have a blood or urine sample taken.  Ask your health care provider how your surgical site will be marked or identified.  Plan to have someone take you home from the hospital.  If you will be going home right after the procedure, plan to have someone with you for 24 hours. What happens during the procedure?  To reduce your risk of infection: ? Your health care team will wash or sanitize their  hands. ? Your skin will be washed with soap. ? Hair may be removed from the surgical area.  An IV tube will be inserted into one of your veins.  You will be given one or more of the following: ? A medicine to help you relax (sedative). ? A medicine to make you fall asleep (general anesthetic).  A thin tube (catheter) will be inserted through your urethra and into your bladder. The catheter drains urine during your procedure.  Depending on the type of surgery you are having, your surgeon will do one of the following: ? Make one incision in your abdomen (open surgery). ? Make two small incisions in your abdomen (laparoscopic surgery). The laparoscope will be passed through one incision, and surgical instruments will be passed through the other. ? Make several small incisions in your abdomen (robot-assisted surgery). A laparoscope and other surgical instruments may be passed through the incisions.  Your fallopian tubes and ovaries will be cut away from the uterus and removed.  Your blood vessels will be clamped and tied to prevent too much bleeding.  The incision(s) in your abdomen will be closed with stitches (sutures) or staples.  A bandage (dressing) may be placed over your incision(s). The procedure may vary among health care providers and hospitals. What happens after the procedure?  Your blood pressure, heart rate, breathing rate, and blood oxygen level will be monitored until the medicines you were given have worn off.  You may continue to receive fluids and medicines through an IV tube.  You may continue to have a catheter draining your urine.  You may have to wear compression stockings. These stockings help to prevent blood clots and reduce swelling in your legs.  You will be given pain medicine as needed.  Do not drive for 24 hours if you received a sedative. Summary  Bilateral salpingo-oophorectomy is a procedure to remove both fallopian tubes and both  ovaries.  There are three different techniques that can be used for this procedure, including open, laparoscopic, and robotic. Talk with your health care provider about how your procedure will be done.  As a result of this procedure, you will become sterile and you will go into menopause.  Plan to have someone take you home from the hospital. This information is not intended to replace advice given to you by your health care provider. Make sure you discuss any questions you have with your health care provider. Document Released: 01/09/2005 Document Revised: 02/14/2016 Document Reviewed: 02/14/2016 Elsevier Interactive Patient Education  2018 [place].  Bilateral Salpingo-Oophorectomy, Care After This sheet gives you information about how to care for yourself after your procedure. Your health care provider may also give you more specific instructions. If you have problems or questions, contact your health care provider. What can I expect after the procedure? After the procedure, it is common to have:  Abdominal pain.  Some occasional vaginal bleeding (spotting).  Tiredness.  Symptoms of menopause, such as hot flashes, night sweats, or mood swings.  Follow these instructions at home: Incision care  Keep your incision area and your bandage (dressing) clean and dry.  Follow instructions from your health care provider about how to take care of your incision. Make sure you: ? Wash your hands with soap and water before you change your dressing. If soap and water are not available, use hand sanitizer. ? Change your dressing as told by your health care provider. ? Leave stitches (sutures), staples, skin glue, or adhesive strips in place. These skin closures may need to stay in place for 2 weeks or longer. If adhesive strip edges start to loosen and curl up, you may trim the loose edges. Do not remove adhesive strips completely unless your health care provider tells you to do  that.  Check your incision area every day for signs of infection. Check for: ? Redness, swelling, or pain. ? Fluid or blood. ? Warmth. ? Pus or a bad smell. Activity  Do not drive or use heavy machinery while taking prescription pain medicine.  Do not drive for 24 hours if you received a medicine to help you relax (sedative) during your procedure.  Take frequent, short walks throughout the day. Rest when you get tired. Ask your health care provider what activities are safe for you.  Avoid activity that requires great effort. Also, avoid heavy lifting. Do not lift anything that is heavier than 10 lbs. (4.5 kg), or the limit that your health care provider tells you, until he or she says that it is safe to do so.  Do not douche, use tampons, or have sex until your health care provider approves. General instructions  To prevent or treat constipation while you are taking prescription pain medicine, your health care provider may recommend that you: ? Drink enough fluid to keep your  urine clear or pale yellow. ? Take over-the-counter or prescription medicines. ? Eat foods that are high in fiber, such as fresh fruits and vegetables, whole grains, and beans. ? Limit foods that are high in fat and processed sugars, such as fried and sweet foods.  Take over-the-counter and prescription medicines only as told by your health care provider.  Do not take baths, swim, or use a hot tub until your health care provider approves. Ask your health care provider if you can take showers. You may only be allowed to take sponge baths for bathing.  Wear compression stockings as told by your health care provider. These stockings help to prevent blood clots and reduce swelling in your legs.  Keep all follow-up visits as told by your health care provider. This is important. Contact a health care provider if:  You have pain when you urinate.  You have pus or a bad smelling discharge coming from your  vagina.  You have redness, swelling, or pain around your incision.  You have fluid or blood coming from your incision.  Your incision feels warm to the touch.  You have pus or a bad smell coming from your incision.  You have a fever.  Your incision starts to break open.  You have pain in the abdomen, and it gets worse or does not get better when you take medicine.  You develop a rash.  You develop nausea and vomiting.  You feel lightheaded. Get help right away if:  You develop pain in your chest or leg.  You become short of breath.  You faint.  You have increased bleeding from your vagina. Summary  After the procedure, it is common to have pain, bleeding in the vagina, and symptoms of menopause.  Follow instructions from your health care provider about how to take care of your incision.  Follow instructions from your health care provider about activities and restrictions.  Check your incision every day for signs of infection and report any symptoms to your health care provider. This information is not intended to replace advice given to you by your health care provider. Make sure you discuss any questions you have with your health care provider. Document Released: 01/09/2005 Document Revised: 02/14/2016 Document Reviewed: 02/14/2016 Elsevier Interactive Patient Education  2018 [place] Anesthesia, Adult General anesthesia is the use of medicines to make a person "go to sleep" (be unconscious) for a medical procedure. General anesthesia is often recommended when a procedure:  Is long.  Requires you to be still or in an unusual position.  Is major and can cause you to lose blood.  Is impossible to do without general anesthesia.  The medicines used for general anesthesia are called general anesthetics. In addition to making you sleep, the medicines:  Prevent pain.  Control your blood pressure.  Relax your muscles.  Tell a health care provider  about:  Any allergies you have.  All medicines you are taking, including vitamins, herbs, eye drops, creams, and over-the-counter medicines.  Any problems you or family members have had with anesthetic medicines.  Types of anesthetics you have had in the past.  Any bleeding disorders you have.  Any surgeries you have had.  Any medical conditions you have.  Any history of heart or lung conditions, such as heart failure, sleep apnea, or chronic obstructive pulmonary disease (COPD).  Whether you are pregnant or may be pregnant.  Whether you use tobacco, alcohol, marijuana, or street drugs.  Any history of  Armed forces logistics/support/administrative officer.  Any history of depression or anxiety. What are the risks? Generally, this is a safe procedure. However, problems may occur, including:  Allergic reaction to anesthetics.  Lung and heart problems.  Inhaling food or liquids from your stomach into your lungs (aspiration).  Injury to nerves.  Waking up during your procedure and being unable to move (rare).  Extreme agitation or a state of mental confusion (delirium) when you wake up from the anesthetic.  Air in the bloodstream, which can lead to stroke.  These problems are more likely to develop if you are having a major surgery or if you have an advanced medical condition. You can prevent some of these complications by answering all of your health care provider's questions thoroughly and by following all pre-procedure instructions. General anesthesia can cause side effects, including:  Nausea or vomiting  A sore throat from the breathing tube.  Feeling cold or shivery.  Feeling tired, washed out, or achy.  Sleepiness or drowsiness.  Confusion or agitation.  What happens before the procedure? Staying hydrated Follow instructions from your health care provider about hydration, which may include:  Up to 2 hours before the procedure - you may continue to drink clear liquids, such as water, clear  fruit juice, black coffee, and plain tea.  Eating and drinking restrictions Follow instructions from your health care provider about eating and drinking, which may include:  8 hours before the procedure - stop eating heavy meals or foods such as meat, fried foods, or fatty foods.  6 hours before the procedure - stop eating light meals or foods, such as toast or cereal.  6 hours before the procedure - stop drinking milk or drinks that contain milk.  2 hours before the procedure - stop drinking clear liquids.  Medicines  Ask your health care provider about: ? Changing or stopping your regular medicines. This is especially important if you are taking diabetes medicines or blood thinners. ? Taking medicines such as aspirin and ibuprofen. These medicines can thin your blood. Do not take these medicines before your procedure if your health care provider instructs you not to. ? Taking new dietary supplements or medicines. Do not take these during the week before your procedure unless your health care provider approves them.  If you are told to take a medicine or to continue taking a medicine on the day of the procedure, take the medicine with sips of water. General instructions   Ask if you will be going home the same day, the following day, or after a longer hospital stay. ? Plan to have someone take you home. ? Plan to have someone stay with you for the first 24 hours after you leave the hospital or clinic.  For 3-6 weeks before the procedure, try not to use any tobacco products, such as cigarettes, chewing tobacco, and e-cigarettes.  You may brush your teeth on the morning of the procedure, but make sure to spit out the toothpaste. What happens during the procedure?  You will be given anesthetics through a mask and through an IV tube in one of your veins.  You may receive medicine to help you relax (sedative).  As soon as you are asleep, a breathing tube may be used to help you  breathe.  An anesthesia specialist will stay with you throughout the procedure. He or she will help keep you comfortable and safe by continuing to give you medicines and adjusting the amount of medicine that you get. He or  she will also watch your blood pressure, pulse, and oxygen levels to make sure that the anesthetics do not cause any problems.  If a breathing tube was used to help you breathe, it will be removed before you wake up. The procedure may vary among health care providers and hospitals. What happens after the procedure?  You will wake up, often slowly, after the procedure is complete, usually in a recovery area.  Your blood pressure, heart rate, breathing rate, and blood oxygen level will be monitored until the medicines you were given have worn off.  You may be given medicine to help you calm down if you feel anxious or agitated.  If you will be going home the same day, your health care provider may check to make sure you can stand, drink, and urinate.  Your health care providers will treat your pain and side effects before you go home.  Do not drive for 24 hours if you received a sedative.  You may: ? Feel nauseous and vomit. ? Have a sore throat. ? Have mental slowness. ? Feel cold or shivery. ? Feel sleepy. ? Feel tired. ? Feel sore or achy, even in parts of your body where you did not have surgery. This information is not intended to replace advice given to you by your health care provider. Make sure you discuss any questions you have with your health care provider. Document Released: 04/18/2007 Document Revised: 06/22/2015 Document Reviewed: 12/24/2014 Elsevier Interactive Patient Education  2018 [place] [facility], Adult, Care After These instructions provide you with information about caring for yourself after your procedure. Your health care provider may also give you more specific instructions. Your treatment has been planned according to current  medical practices, but problems sometimes occur. Call your health care provider if you have any problems or questions after your procedure. What can I expect after the procedure? After the procedure, it is common to have:  Vomiting.  A sore throat.  Mental slowness.  It is common to feel:  Nauseous.  Cold or shivery.  Sleepy.  Tired.  Sore or achy, even in parts of your body where you did not have surgery.  Follow these instructions at home: For at least 24 hours after the procedure:  Do not: ? Participate in activities where you could fall or become injured. ? Drive. ? Use heavy machinery. ? Drink alcohol. ? Take sleeping pills or medicines that cause drowsiness. ? Make important decisions or sign legal documents. ? Take care of children on your own.  Rest. Eating and drinking  If you vomit, drink water, juice, or soup when you can drink without vomiting.  Drink enough fluid to keep your urine clear or pale yellow.  Make sure you have little or no nausea before eating solid foods.  Follow the diet recommended by your health care provider. General instructions  Have a responsible adult stay with you until you are awake and alert.  Return to your normal activities as told by your health care provider. Ask your health care provider what activities are safe for you.  Take over-the-counter and prescription medicines only as told by your health care provider.  If you smoke, do not smoke without supervision.  Keep all follow-up visits as told by your health care provider. This is important. Contact a health care provider if:  You continue to have nausea or vomiting at home, and medicines are not helpful.  You cannot drink fluids or start eating again.  You  cannot urinate after 8-12 hours.  You develop a skin rash.  You have fever.  You have increasing redness at the site of your procedure. Get help right away if:  You have difficulty breathing.  You  have chest pain.  You have unexpected bleeding.  You feel that you are having a life-threatening or urgent problem. This information is not intended to replace advice given to you by your health care provider. Make sure you discuss any questions you have with your health care provider. Document Released: 04/17/2000 Document Revised: 06/14/2015 Document Reviewed: 12/24/2014 Elsevier Interactive Patient Education  [employer].

## 2017-05-30 ENCOUNTER — Other Ambulatory Visit: Payer: Self-pay | Admitting: Obstetrics & Gynecology

## 2017-06-01 ENCOUNTER — Encounter (HOSPITAL_COMMUNITY)
Admission: RE | Admit: 2017-06-01 | Discharge: 2017-06-01 | Disposition: A | Discharge status: Home / Self Care | Payer: Medicare Other | Source: Ambulatory Visit | Attending: Obstetrics & Gynecology | Admitting: Obstetrics & Gynecology

## 2017-06-01 ENCOUNTER — Other Ambulatory Visit: Payer: Self-pay

## 2017-06-01 ENCOUNTER — Encounter (HOSPITAL_COMMUNITY): Payer: Self-pay

## 2017-06-01 DIAGNOSIS — Z79899 Other long term (current) drug therapy: Secondary | ICD-10-CM | POA: Insufficient documentation

## 2017-06-01 DIAGNOSIS — Z794 Long term (current) use of insulin: Secondary | ICD-10-CM | POA: Insufficient documentation

## 2017-06-01 DIAGNOSIS — D219 Benign neoplasm of connective and other soft tissue, unspecified: Secondary | ICD-10-CM | POA: Insufficient documentation

## 2017-06-01 DIAGNOSIS — R9431 Abnormal electrocardiogram [ECG] [EKG]: Secondary | ICD-10-CM | POA: Insufficient documentation

## 2017-06-01 DIAGNOSIS — N857 Hematometra: Secondary | ICD-10-CM | POA: Diagnosis not present

## 2017-06-01 DIAGNOSIS — R102 Pelvic and perineal pain: Secondary | ICD-10-CM | POA: Diagnosis not present

## 2017-06-01 DIAGNOSIS — Z01810 Encounter for preprocedural cardiovascular examination: Secondary | ICD-10-CM | POA: Insufficient documentation

## 2017-06-01 LAB — CBC
HCT: 44.7 % (ref 36.0–46.0)
Hemoglobin: 15.2 g/dL — ABNORMAL HIGH (ref 12.0–15.0)
MCH: 30.6 pg (ref 26.0–34.0)
MCHC: 34 g/dL (ref 30.0–36.0)
MCV: 90.1 fL (ref 78.0–100.0)
Platelets: 282 10*3/uL (ref 150–400)
RBC: 4.96 MIL/uL (ref 3.87–5.11)
RDW: 12 % (ref 11.5–15.5)
WBC: 9.1 10*3/uL (ref 4.0–10.5)

## 2017-06-01 LAB — URINALYSIS, ROUTINE W REFLEX MICROSCOPIC
Bilirubin Urine: NEGATIVE
Glucose, UA: >=500 mg/dL — AB
Hgb urine dipstick: NEGATIVE
Ketones, ur: NEGATIVE mg/dL
Leukocytes, UA: NEGATIVE
Nitrite: NEGATIVE
Protein, ur: NEGATIVE mg/dL
Specific Gravity, Urine: 1.005 (ref 1.005–1.030)
pH: 6 (ref 5.0–8.0)

## 2017-06-01 LAB — COMPREHENSIVE METABOLIC PANEL
ALT: 22 U/L (ref 14–54)
AST: 26 U/L (ref 15–41)
Albumin: 3.8 g/dL (ref 3.5–5.0)
Alkaline Phosphatase: 113 U/L (ref 38–126)
Anion gap: 9 (ref 5–15)
BUN: 10 mg/dL (ref 6–20)
CO2: 21 mmol/L — ABNORMAL LOW (ref 22–32)
Calcium: 9 mg/dL (ref 8.9–10.3)
Chloride: 104 mmol/L (ref 101–111)
Creatinine, Ser: 0.68 mg/dL (ref 0.44–1.00)
GFR calc Af Amer: >60 mL/min (ref >60)
GFR calc non Af Amer: >60 mL/min (ref >60)
Glucose, Bld: 294 mg/dL — ABNORMAL HIGH (ref 65–99)
Potassium: 3.8 mmol/L (ref 3.5–5.1)
Sodium: 134 mmol/L — ABNORMAL LOW (ref 135–145)
Total Bilirubin: 0.4 mg/dL (ref 0.3–1.2)
Total Protein: 6.6 g/dL (ref 6.5–8.1)

## 2017-06-01 LAB — RAPID HIV SCREEN (HIV 1/2 AB+AG)
HIV 1/2 Antibodies: NONREACTIVE
HIV-1 P24 Antigen - HIV24: NONREACTIVE

## 2017-06-01 LAB — HEMOGLOBIN A1C
Hgb A1c MFr Bld: 7 % — ABNORMAL HIGH (ref 4.8–5.6)
Mean Plasma Glucose: 154.2 mg/dL

## 2017-06-01 LAB — GLUCOSE, CAPILLARY: Glucose-Capillary: 271 mg/dL — ABNORMAL HIGH (ref 65–99)

## 2017-06-01 LAB — HCG, QUANTITATIVE, PREGNANCY: hCG, Beta Chain, Quant, S: <1 m[IU]/mL (ref <5)

## 2017-06-06 ENCOUNTER — Inpatient Hospital Stay (HOSPITAL_COMMUNITY)
Admission: RE | Admit: 2017-06-06 | Discharge: 2017-06-07 | DRG: 743 | Disposition: A | Discharge status: Home / Self Care | Payer: Medicare Other | Attending: Obstetrics & Gynecology | Admitting: Obstetrics & Gynecology

## 2017-06-06 ENCOUNTER — Inpatient Hospital Stay (HOSPITAL_COMMUNITY): Payer: Medicare Other | Admitting: Anesthesiology

## 2017-06-06 ENCOUNTER — Encounter (HOSPITAL_COMMUNITY)
Admission: RE | Disposition: A | Discharge status: Home / Self Care | Payer: Self-pay | Source: Home / Self Care | Attending: Obstetrics & Gynecology

## 2017-06-06 ENCOUNTER — Other Ambulatory Visit: Payer: Self-pay

## 2017-06-06 ENCOUNTER — Encounter (HOSPITAL_COMMUNITY): Payer: Self-pay | Admitting: Emergency Medicine

## 2017-06-06 DIAGNOSIS — Z792 Long term (current) use of antibiotics: Secondary | ICD-10-CM | POA: Diagnosis not present

## 2017-06-06 DIAGNOSIS — J45909 Unspecified asthma, uncomplicated: Secondary | ICD-10-CM | POA: Diagnosis present

## 2017-06-06 DIAGNOSIS — D259 Leiomyoma of uterus, unspecified: Secondary | ICD-10-CM | POA: Diagnosis present

## 2017-06-06 DIAGNOSIS — Z888 Allergy status to other drugs, medicaments and biological substances status: Secondary | ICD-10-CM | POA: Diagnosis not present

## 2017-06-06 DIAGNOSIS — Z79899 Other long term (current) drug therapy: Secondary | ICD-10-CM

## 2017-06-06 DIAGNOSIS — Z9884 Bariatric surgery status: Secondary | ICD-10-CM | POA: Diagnosis not present

## 2017-06-06 DIAGNOSIS — N800 Endometriosis of uterus: Secondary | ICD-10-CM | POA: Diagnosis not present

## 2017-06-06 DIAGNOSIS — Z794 Long term (current) use of insulin: Secondary | ICD-10-CM | POA: Diagnosis not present

## 2017-06-06 DIAGNOSIS — G894 Chronic pain syndrome: Secondary | ICD-10-CM | POA: Diagnosis not present

## 2017-06-06 DIAGNOSIS — Z833 Family history of diabetes mellitus: Secondary | ICD-10-CM | POA: Diagnosis not present

## 2017-06-06 DIAGNOSIS — G4733 Obstructive sleep apnea (adult) (pediatric): Secondary | ICD-10-CM | POA: Diagnosis present

## 2017-06-06 DIAGNOSIS — I10 Essential (primary) hypertension: Secondary | ICD-10-CM | POA: Diagnosis present

## 2017-06-06 DIAGNOSIS — Z882 Allergy status to sulfonamides status: Secondary | ICD-10-CM

## 2017-06-06 DIAGNOSIS — Z881 Allergy status to other antibiotic agents status: Secondary | ICD-10-CM | POA: Diagnosis not present

## 2017-06-06 DIAGNOSIS — E785 Hyperlipidemia, unspecified: Secondary | ICD-10-CM | POA: Diagnosis present

## 2017-06-06 DIAGNOSIS — N857 Hematometra: Secondary | ICD-10-CM | POA: Diagnosis present

## 2017-06-06 DIAGNOSIS — R102 Pelvic and perineal pain: Secondary | ICD-10-CM | POA: Diagnosis not present

## 2017-06-06 DIAGNOSIS — Z90710 Acquired absence of both cervix and uterus: Secondary | ICD-10-CM | POA: Diagnosis present

## 2017-06-06 DIAGNOSIS — D252 Subserosal leiomyoma of uterus: Secondary | ICD-10-CM | POA: Diagnosis not present

## 2017-06-06 DIAGNOSIS — N838 Other noninflammatory disorders of ovary, fallopian tube and broad ligament: Secondary | ICD-10-CM | POA: Diagnosis not present

## 2017-06-06 DIAGNOSIS — E119 Type 2 diabetes mellitus without complications: Secondary | ICD-10-CM | POA: Diagnosis present

## 2017-06-06 HISTORY — PX: ABDOMINAL HYSTERECTOMY: SHX81

## 2017-06-06 HISTORY — PX: SALPINGOOPHORECTOMY: SHX82

## 2017-06-06 LAB — TYPE AND SCREEN
ABO/RH(D): O POS
Antibody Screen: NEGATIVE

## 2017-06-06 LAB — GLUCOSE, CAPILLARY
Glucose-Capillary: 182 mg/dL — ABNORMAL HIGH (ref 65–99)
Glucose-Capillary: 173 mg/dL — ABNORMAL HIGH (ref 65–99)
Glucose-Capillary: 260 mg/dL — ABNORMAL HIGH (ref 65–99)

## 2017-06-06 SURGERY — HYSTERECTOMY, ABDOMINAL
Anesthesia: General | Site: Abdomen

## 2017-06-06 MED ORDER — LAMOTRIGINE 100 MG PO TABS
100.0000 mg | ORAL_TABLET | Freq: Every day | ORAL | Status: DC
  Administered 2017-06-06: 100 mg via ORAL
  Filled 2017-06-06: qty 1

## 2017-06-06 MED ORDER — ZOLPIDEM TARTRATE 10 MG PO TABS: 10.0000 mg | ORAL_TABLET | Freq: Every evening | ORAL | Status: DC | PRN

## 2017-06-06 MED ORDER — KCL IN DEXTROSE-NACL 40-5-0.45 MEQ/L-%-% IV SOLN
INTRAVENOUS | Status: AC
  Administered 2017-06-06 – 2017-06-07 (×3): via INTRAVENOUS

## 2017-06-06 MED ORDER — DIPHENHYDRAMINE HCL 25 MG PO CAPS: 25.0000 mg | ORAL_CAPSULE | Freq: Four times a day (QID) | ORAL | Status: DC | PRN

## 2017-06-06 MED ORDER — BUPIVACAINE LIPOSOME 1.3 % IJ SUSP
20.0000 mL | Freq: Once | INTRAMUSCULAR | Status: DC
Start: 2017-06-06 — End: 2017-06-06

## 2017-06-06 MED ORDER — VENLAFAXINE HCL 75 MG PO TABS
75.0000 mg | ORAL_TABLET | Freq: Every day | ORAL | Status: DC
  Administered 2017-06-07: 75 mg via ORAL
  Filled 2017-06-06 (×2): qty 1
  Filled 2017-06-06: qty 2

## 2017-06-06 MED ORDER — LIDOCAINE HCL (CARDIAC) PF 100 MG/5ML IV SOSY
PREFILLED_SYRINGE | INTRAVENOUS | Status: DC | PRN
  Administered 2017-06-06: 100 mg via INTRAVENOUS

## 2017-06-06 MED ORDER — KETOROLAC TROMETHAMINE 30 MG/ML IJ SOLN
INTRAMUSCULAR | Status: AC
  Filled 2017-06-06: qty 1

## 2017-06-06 MED ORDER — BUPIVACAINE LIPOSOME 1.3 % IJ SUSP
INTRAMUSCULAR | Status: AC
Start: 2017-06-06 — End: ?
  Filled 2017-06-06: qty 20

## 2017-06-06 MED ORDER — SUGAMMADEX SODIUM 500 MG/5ML IV SOLN
INTRAVENOUS | Status: DC | PRN
  Administered 2017-06-06: 200 mg via INTRAVENOUS

## 2017-06-06 MED ORDER — VENLAFAXINE HCL 37.5 MG PO TABS
150.0000 mg | ORAL_TABLET | Freq: Every day | ORAL | Status: DC
  Administered 2017-06-06: 150 mg via ORAL
  Filled 2017-06-06: qty 4

## 2017-06-06 MED ORDER — MIDAZOLAM HCL 2 MG/2ML IJ SOLN
INTRAMUSCULAR | Status: DC | PRN
  Administered 2017-06-06 (×2): 1 mg via INTRAVENOUS

## 2017-06-06 MED ORDER — BUPIVACAINE LIPOSOME 1.3 % IJ SUSP
INTRAMUSCULAR | Status: DC | PRN
  Administered 2017-06-06: 20 mL

## 2017-06-06 MED ORDER — KETOROLAC TROMETHAMINE 30 MG/ML IJ SOLN
30.0000 mg | Freq: Four times a day (QID) | INTRAMUSCULAR | Status: AC
  Administered 2017-06-06 – 2017-06-07 (×3): 30 mg via INTRAVENOUS
  Filled 2017-06-06 (×3): qty 1

## 2017-06-06 MED ORDER — SIMETHICONE 80 MG PO CHEW: 80.0000 mg | CHEWABLE_TABLET | Freq: Four times a day (QID) | ORAL | Status: DC | PRN

## 2017-06-06 MED ORDER — HYDROMORPHONE HCL 1 MG/ML IJ SOLN
0.2500 mg | INTRAMUSCULAR | Status: DC | PRN
Start: 2017-06-06 — End: 2017-06-06
  Administered 2017-06-06 (×4): 0.5 mg via INTRAVENOUS
  Filled 2017-06-06 (×3): qty 0.5

## 2017-06-06 MED ORDER — ONDANSETRON HCL 4 MG/2ML IJ SOLN: 4.0000 mg | Freq: Once | INTRAMUSCULAR | Status: DC | PRN

## 2017-06-06 MED ORDER — ALBUTEROL SULFATE (2.5 MG/3ML) 0.083% IN NEBU: 3.0000 mL | INHALATION_SOLUTION | Freq: Four times a day (QID) | RESPIRATORY_TRACT | Status: DC | PRN

## 2017-06-06 MED ORDER — PROMETHAZINE HCL 25 MG/ML IJ SOLN
25.0000 mg | Freq: Four times a day (QID) | INTRAMUSCULAR | Status: DC | PRN
  Administered 2017-06-06 (×2): 25 mg via INTRAVENOUS
  Filled 2017-06-06 (×2): qty 1

## 2017-06-06 MED ORDER — SUGAMMADEX SODIUM 500 MG/5ML IV SOLN
INTRAVENOUS | Status: AC
  Filled 2017-06-06: qty 5

## 2017-06-06 MED ORDER — ONDANSETRON HCL 4 MG/2ML IJ SOLN
INTRAMUSCULAR | Status: DC | PRN
  Administered 2017-06-06: 4 mg via INTRAVENOUS

## 2017-06-06 MED ORDER — PROPOFOL 10 MG/ML IV BOLUS
INTRAVENOUS | Status: DC | PRN
  Administered 2017-06-06: 160 mg via INTRAVENOUS

## 2017-06-06 MED ORDER — ATORVASTATIN CALCIUM 10 MG PO TABS
10.0000 mg | ORAL_TABLET | Freq: Every day | ORAL | Status: DC
  Administered 2017-06-06: 10 mg via ORAL
  Filled 2017-06-06: qty 1

## 2017-06-06 MED ORDER — CEFAZOLIN SODIUM-DEXTROSE 2-4 GM/100ML-% IV SOLN
2.0000 g | INTRAVENOUS | Status: AC
  Administered 2017-06-06: 2 g via INTRAVENOUS

## 2017-06-06 MED ORDER — HYDROMORPHONE HCL 1 MG/ML IJ SOLN
1.0000 mg | INTRAMUSCULAR | Status: DC | PRN
  Administered 2017-06-06 (×2): 1 mg via INTRAVENOUS
  Filled 2017-06-06 (×2): qty 1

## 2017-06-06 MED ORDER — LORATADINE 10 MG PO TABS
10.0000 mg | ORAL_TABLET | Freq: Every day | ORAL | Status: DC
  Administered 2017-06-07: 10 mg via ORAL
  Filled 2017-06-06: qty 1

## 2017-06-06 MED ORDER — ZOLPIDEM TARTRATE 5 MG PO TABS: 5.0000 mg | ORAL_TABLET | Freq: Every evening | ORAL | Status: DC | PRN

## 2017-06-06 MED ORDER — BUDESONIDE 0.25 MG/2ML IN SUSP
0.2500 mg | Freq: Two times a day (BID) | RESPIRATORY_TRACT | Status: DC
  Administered 2017-06-06 – 2017-06-07 (×2): 0.25 mg via RESPIRATORY_TRACT
  Filled 2017-06-06 (×2): qty 2

## 2017-06-06 MED ORDER — ONDANSETRON HCL 4 MG/2ML IJ SOLN
INTRAMUSCULAR | Status: AC
  Filled 2017-06-06: qty 2

## 2017-06-06 MED ORDER — METOPROLOL TARTRATE 25 MG PO TABS
25.0000 mg | ORAL_TABLET | Freq: Two times a day (BID) | ORAL | Status: DC
  Administered 2017-06-06 – 2017-06-07 (×2): 25 mg via ORAL
  Filled 2017-06-06 (×2): qty 1

## 2017-06-06 MED ORDER — DOCUSATE SODIUM 100 MG PO CAPS
100.0000 mg | ORAL_CAPSULE | Freq: Two times a day (BID) | ORAL | Status: DC
  Administered 2017-06-06 – 2017-06-07 (×2): 100 mg via ORAL
  Filled 2017-06-06 (×2): qty 1

## 2017-06-06 MED ORDER — KETOROLAC TROMETHAMINE 30 MG/ML IJ SOLN
30.0000 mg | Freq: Once | INTRAMUSCULAR | Status: AC
  Administered 2017-06-06: 30 mg via INTRAVENOUS

## 2017-06-06 MED ORDER — SENNOSIDES-DOCUSATE SODIUM 8.6-50 MG PO TABS: 1.0000 | ORAL_TABLET | Freq: Every evening | ORAL | Status: DC | PRN

## 2017-06-06 MED ORDER — GLYCOPYRROLATE 0.2 MG/ML IJ SOLN
INTRAMUSCULAR | Status: DC | PRN
  Administered 2017-06-06 (×2): 0.1 mg via INTRAVENOUS

## 2017-06-06 MED ORDER — INSULIN GLARGINE 100 UNIT/ML ~~LOC~~ SOLN
14.0000 [IU] | Freq: Every day | SUBCUTANEOUS | Status: DC
  Administered 2017-06-06: 14 [IU] via SUBCUTANEOUS
  Filled 2017-06-06 (×2): qty 0.14

## 2017-06-06 MED ORDER — ONDANSETRON HCL 8 MG PO TABS
8.0000 mg | ORAL_TABLET | Freq: Four times a day (QID) | ORAL | Status: DC | PRN
  Administered 2017-06-07: 8 mg via ORAL
  Filled 2017-06-06 (×2): qty 1
  Filled 2017-06-06: qty 2

## 2017-06-06 MED ORDER — SODIUM CHLORIDE 0.9 % IV SOLN: 8.0000 mg | Freq: Four times a day (QID) | INTRAVENOUS | Status: DC | PRN

## 2017-06-06 MED ORDER — PROPOFOL 10 MG/ML IV BOLUS
INTRAVENOUS | Status: AC
  Filled 2017-06-06: qty 20

## 2017-06-06 MED ORDER — HYDROMORPHONE HCL 1 MG/ML IJ SOLN
INTRAMUSCULAR | Status: AC
  Filled 2017-06-06: qty 0.5

## 2017-06-06 MED ORDER — SUCCINYLCHOLINE CHLORIDE 200 MG/10ML IV SOSY
PREFILLED_SYRINGE | INTRAVENOUS | Status: DC | PRN
  Administered 2017-06-06: 120 mg via INTRAVENOUS

## 2017-06-06 MED ORDER — METFORMIN HCL 500 MG PO TABS
500.0000 mg | ORAL_TABLET | Freq: Two times a day (BID) | ORAL | Status: DC
  Administered 2017-06-06: 500 mg via ORAL
  Filled 2017-06-06 (×2): qty 1

## 2017-06-06 MED ORDER — ROCURONIUM BROMIDE 100 MG/10ML IV SOLN
INTRAVENOUS | Status: DC | PRN
  Administered 2017-06-06: 10 mg via INTRAVENOUS
  Administered 2017-06-06 (×4): 5 mg via INTRAVENOUS
  Administered 2017-06-06: 20 mg via INTRAVENOUS
  Administered 2017-06-06: 5 mg via INTRAVENOUS

## 2017-06-06 MED ORDER — FENTANYL CITRATE (PF) 100 MCG/2ML IJ SOLN
INTRAMUSCULAR | Status: DC | PRN
  Administered 2017-06-06 (×4): 50 ug via INTRAVENOUS
  Administered 2017-06-06 (×2): 25 ug via INTRAVENOUS

## 2017-06-06 MED ORDER — SODIUM CHLORIDE 0.9 % IR SOLN
Status: DC | PRN
  Administered 2017-06-06: 2000 mL

## 2017-06-06 MED ORDER — LOSARTAN POTASSIUM 50 MG PO TABS
100.0000 mg | ORAL_TABLET | Freq: Every day | ORAL | Status: DC
  Administered 2017-06-07: 100 mg via ORAL
  Filled 2017-06-06: qty 2

## 2017-06-06 MED ORDER — BISACODYL 10 MG RE SUPP: 10.0000 mg | Freq: Every day | RECTAL | Status: DC | PRN

## 2017-06-06 MED ORDER — SUGAMMADEX SODIUM 200 MG/2ML IV SOLN
INTRAVENOUS | Status: AC
  Filled 2017-06-06: qty 2

## 2017-06-06 MED ORDER — MEPERIDINE HCL 50 MG/ML IJ SOLN: 6.2500 mg | INTRAMUSCULAR | Status: DC | PRN

## 2017-06-06 MED ORDER — HYDROCODONE-ACETAMINOPHEN 7.5-325 MG PO TABS: 1.0000 | ORAL_TABLET | Freq: Once | ORAL | Status: DC | PRN

## 2017-06-06 MED ORDER — HEMOSTATIC AGENTS (NO CHARGE) OPTIME
TOPICAL | Status: DC | PRN
  Administered 2017-06-06: 1 via TOPICAL

## 2017-06-06 MED ORDER — LACTATED RINGERS IV SOLN
INTRAVENOUS | Status: DC
  Administered 2017-06-06 (×3): via INTRAVENOUS

## 2017-06-06 MED ORDER — PHENYLEPHRINE HCL 10 MG/ML IJ SOLN
INTRAMUSCULAR | Status: DC | PRN
  Administered 2017-06-06 (×2): 40 ug via INTRAVENOUS
  Administered 2017-06-06: 80 ug via INTRAVENOUS
  Administered 2017-06-06 (×3): 40 ug via INTRAVENOUS

## 2017-06-06 MED ORDER — KETOROLAC TROMETHAMINE 30 MG/ML IJ SOLN
30.0000 mg | Freq: Once | INTRAMUSCULAR | Status: DC | PRN
  Filled 2017-06-06: qty 1

## 2017-06-06 MED ORDER — MIDAZOLAM HCL 2 MG/2ML IJ SOLN
INTRAMUSCULAR | Status: AC
  Filled 2017-06-06: qty 2

## 2017-06-06 MED ORDER — FENTANYL CITRATE (PF) 250 MCG/5ML IJ SOLN
INTRAMUSCULAR | Status: AC
  Filled 2017-06-06: qty 5

## 2017-06-06 MED ORDER — CEFAZOLIN SODIUM-DEXTROSE 2-4 GM/100ML-% IV SOLN
INTRAVENOUS | Status: AC
  Filled 2017-06-06: qty 100

## 2017-06-06 MED ORDER — GLYCOPYRROLATE 0.2 MG/ML IJ SOLN
INTRAMUSCULAR | Status: AC
  Filled 2017-06-06: qty 1

## 2017-06-06 SURGICAL SUPPLY — 43 items
ADH SKN CLS APL DERMABOND .7 (GAUZE/BANDAGES/DRESSINGS) ×2
CLOTH BEACON ORANGE TIMEOUT ST (SAFETY) ×3 IMPLANT
COVER LIGHT HANDLE STERIS (MISCELLANEOUS) ×6 IMPLANT
DERMABOND ADVANCED (GAUZE/BANDAGES/DRESSINGS) ×1
DERMABOND ADVANCED .7 DNX12 (GAUZE/BANDAGES/DRESSINGS) ×2 IMPLANT
DRAPE WARM FLUID 44X44 (DRAPE) ×3 IMPLANT
DRSG OPSITE POSTOP 4X10 (GAUZE/BANDAGES/DRESSINGS) ×1 IMPLANT
DRSG OPSITE POSTOP 4X8 (GAUZE/BANDAGES/DRESSINGS) ×3 IMPLANT
DRSG TELFA 3X8 NADH (GAUZE/BANDAGES/DRESSINGS) ×3 IMPLANT
ELECT REM PT RETURN 9FT ADLT (ELECTROSURGICAL) ×3
ELECTRODE REM PT RTRN 9FT ADLT (ELECTROSURGICAL) ×2 IMPLANT
GLOVE BIOGEL PI IND STRL 7.0 (GLOVE) ×4 IMPLANT
GLOVE BIOGEL PI IND STRL 8 (GLOVE) ×2 IMPLANT
GLOVE BIOGEL PI INDICATOR 7.0 (GLOVE) ×2
GLOVE BIOGEL PI INDICATOR 8 (GLOVE) ×1
GLOVE ECLIPSE 8.0 STRL XLNG CF (GLOVE) ×3 IMPLANT
GOWN STRL REUS W/TWL LRG LVL3 (GOWN DISPOSABLE) ×6 IMPLANT
GOWN STRL REUS W/TWL XL LVL3 (GOWN DISPOSABLE) ×3 IMPLANT
HEMOSTAT ARISTA ABSORB 3G PWDR (MISCELLANEOUS) ×1 IMPLANT
INST SET MAJOR GENERAL (KITS) ×3 IMPLANT
KIT TURNOVER KIT A (KITS) ×3 IMPLANT
MANIFOLD NEPTUNE II (INSTRUMENTS) ×3 IMPLANT
NDL HYPO 21X1.5 SAFETY (NEEDLE) ×2 IMPLANT
NEEDLE HYPO 21X1.5 SAFETY (NEEDLE) ×3 IMPLANT
NS IRRIG 1000ML POUR BTL (IV SOLUTION) ×6 IMPLANT
PACK ABDOMINAL MAJOR (CUSTOM PROCEDURE TRAY) ×3 IMPLANT
PAD ARMBOARD 7.5X6 YLW CONV (MISCELLANEOUS) ×3 IMPLANT
PAD DRESSING TELFA 3X8 NADH (GAUZE/BANDAGES/DRESSINGS) ×2 IMPLANT
RETRACTOR WND ALEXIS 25 LRG (MISCELLANEOUS) IMPLANT
RTRCTR WOUND ALEXIS 25CM LRG (MISCELLANEOUS) ×3
SET BASIN LINEN APH (SET/KITS/TRAYS/PACK) ×3 IMPLANT
STAPLER VISISTAT 35W (STAPLE) ×1 IMPLANT
SUT CHROMIC 0 CT 1 (SUTURE) ×3 IMPLANT
SUT MON AB 3-0 SH 27 (SUTURE) ×1 IMPLANT
SUT VIC AB 0 CT1 27 (SUTURE) ×12
SUT VIC AB 0 CT1 27XBRD ANTBC (SUTURE) ×2 IMPLANT
SUT VIC AB 0 CT1 27XCR 8 STRN (SUTURE) ×4 IMPLANT
SUT VIC AB 0 CTX 36 (SUTURE) ×3
SUT VIC AB 0 CTX36XBRD ANTBCTR (SUTURE) ×2 IMPLANT
SUT VICRYL 3 0 (SUTURE) ×3 IMPLANT
SYR 20CC LL (SYRINGE) ×3 IMPLANT
TOWEL BLUE STERILE X RAY DET (MISCELLANEOUS) ×3 IMPLANT
TRAY FOLEY CATH 16FR SILVER (SET/KITS/TRAYS/PACK) ×1 IMPLANT

## 2017-06-06 NOTE — Progress Notes (Signed)
[name] catheter removed per order. Awaiting pt to void.

## 2017-06-06 NOTE — Plan of Care (Signed)
Gait unsteady, one person assist. At current time patient states pain level is within defined parameters. Patient calls for assistance. Surgical dressing intact.

## 2017-06-06 NOTE — Anesthesia Preprocedure Evaluation (Signed)
Anesthesia Evaluation  Patient identified by MRN, date of birth, ID band Patient awake    Reviewed: Allergy & Precautions, H&P , NPO status , Patient's Chart, lab work & pertinent test results, reviewed documented beta blocker date and time   History of Anesthesia Complications (+) PONV and history of anesthetic complications  Airway Mallampati: II  TM Distance: >3 FB Neck ROM: full    Dental no notable dental hx.    Pulmonary neg pulmonary ROS, shortness of breath, asthma , sleep apnea ,    Pulmonary exam normal breath sounds clear to auscultation       Cardiovascular Exercise Tolerance: Good hypertension, negative cardio ROS   Rhythm:regular Rate:Normal     Neuro/Psych  Headaches, PSYCHIATRIC DISORDERS Anxiety Depression negative neurological ROS  negative psych ROS   GI/Hepatic negative GI ROS, Neg liver ROS, hiatal hernia, GERD  ,  Endo/Other  negative endocrine ROSdiabetes  Renal/GU Renal diseasenegative Renal ROS  negative genitourinary   Musculoskeletal   Abdominal   Peds  Hematology negative hematology ROS (+) anemia ,   Anesthesia Other Findings   Reproductive/Obstetrics negative OB ROS                             Anesthesia Physical Anesthesia Plan  ASA: II  Anesthesia Plan: General   Post-op Pain Management:    Induction:   PONV Risk Score and Plan: Ondansetron and Dexamethasone  Airway Management Planned:   Additional Equipment:   Intra-op Plan:   Post-operative Plan:   Informed Consent: I have reviewed the patients History and Physical, chart, labs and discussed the procedure including the risks, benefits and alternatives for the proposed anesthesia with the patient or authorized representative who has indicated his/her understanding and acceptance.   Dental Advisory Given  Plan Discussed with: CRNA  Anesthesia Plan Comments:         Anesthesia  Quick Evaluation

## 2017-06-06 NOTE — H&P (Signed)
Preoperative History and Physical  Carrie Cook is a 57 y.o. G1P1 with Patient's last menstrual period was 07/10/2013. admitted for a TAH BSO.  Previous notes: Is in today as a consult from the emergency department at [facility] she presented with acute onset of lower abdominal pain And vaginal bleeding Workup included an ultrasound which reveals hematometra as well as a degenerating 8 cm fibroid since that time she is continued to have some bleeding And her pain has subsequently subsided acutely but it is still  There  she rates her daily pain at about a 4 but the pain last week was about 8-10  Carrie Cook has a long-standing myoma which we have defined prior to even do doing her endometrial ablation 3+ years ago unfortunately she is having increasing pain from it probably secondary to degeneration   her daily life is interrupted as a result of it, additionally with a hematometra we discussed options including conservative management and she wants to proceed with definitive surgical management  She is scheduled for a abdominal hysterectomy with bilateral salpingo-oophorectomy on Jun 06, 2017    PMH:    Past Medical History:  Diagnosis Date  . Anemia    PT HAS HAD COLONOSCOPY AND ENDOSCOPY WORK UP - NO PROBLEMS FOUND AS SOURCE OF ANEMIA -- PT HAD IRON INFUSION AT [facility] 11/13/12-AFTER SEEING HEMATOLOGIST DR. Dellwood AND HE GAVE HEMATOLOGIC CLEARANCE FOR GASTRIC BYPASS SURGERY.  Marland Kitchen Anxiety   . Asthma    daily and prn inhalers  . Degenerative joint disease    right knee, spine - STATES INTERMITTENT  NUMBNESS DOWN RT LEG WITH PROLONGED STANDING OR WALKING- THINKS RELATED TO HER SPINE PROBLEMS  . Dental crowns present   . Depression   . Family history of anesthesia complication    pt's mother and sister have hx. of post-op N/V  . Fibroids    UTERINE  . Gastroesophageal reflux disease    none for 2 years since Bariatric surgery.  . H/O hiatal hernia   . History of endometriosis    . Hyperlipidemia    no current med.  . Hypertension    under control with med., has been on med. x 2 yr.  . Insulin dependent diabetes mellitus ([place])   . Lock jaw    jaw locks open if opens mouth wide  . Migraines   . Neuropathy    FEET  . Obesity   . Palpitations   . PONV (postoperative nausea and vomiting)   . Shortness of breath    with daily activities  . Sleep apnea    hasnt had to use CPAP in 2 years since she lost weight    PSH:     Past Surgical History:  Procedure Laterality Date  . BREATH TEK H PYLORI N/A 08/13/2012   Procedure: BREATH TEK H PYLORI;  Surgeon: Edward Jolly, MD;  Location: [facility];  Service: General;  Laterality: N/A;  . CARPAL TUNNEL RELEASE  01/03/2012   Procedure: CARPAL TUNNEL RELEASE;  Surgeon: Wynonia Sours, MD;  Location: [facility];  Service: Orthopedics;  Laterality: Right;  . carpal tunnel release right hand Right 12/13  . COLONOSCOPY  05/2009   SEG:[id number] hemorrhoids otherwise normal colon, rectum and terminal ileum  . COLONOSCOPY WITH ESOPHAGOGASTRODUODENOSCOPY (EGD) N/A 10/28/2012   Procedure: COLONOSCOPY WITH ESOPHAGOGASTRODUODENOSCOPY (EGD);  Surgeon: Daneil Dolin, MD;  [facility];  Service: Endoscopy;  Laterality: N/A;  7:30  . DILITATION & CURRETTAGE/HYSTROSCOPY WITH  NOVASURE ABLATION N/A 07/22/2014   Procedure: DILATATION & CURETTAGE/HYSTEROSCOPY WITH NOVASURE ABLATION; uterine length 6.0 cm; uterine width 3.8 cm; total ablation time 1 minute 15 seconds;  Surgeon: Florian Buff, MD;  Location: [facility];  Service: Gynecology;  Laterality: N/A;  . ESOPHAGOGASTRODUODENOSCOPY  5/013/2011   HEN:IDPOEU esophagus/multiple polyps removed s/p (hyperplastic). Gastritis without H.pylori.  Marland Kitchen GASTRIC ROUX-EN-Y N/A 11/19/2012   Procedure: LAPAROSCOPIC ROUX-EN-Y GASTRIC;  Surgeon: Edward Jolly, MD;  Location: [facility];  Service: General;  Laterality: N/A;  . GIVENS CAPSULE STUDY N/A 10/28/2012   Procedure:  GIVENS CAPSULE STUDY;  Surgeon: Daneil Dolin, MD;  [facility];  Service: Endoscopy;  Laterality: N/A;  . KNEE ARTHROSCOPY WITH MEDIAL MENISECTOMY Right 10/28/2015   Procedure: RIGHT KNEE ARTHROSCOPY WITH MEDIAL MENISECTOMY;  Surgeon: Carole Civil, MD;  Location: [facility];  Service: Orthopedics;  Laterality: Right;  . LAPAROSCOPY     due to endometriosis  . PELVIC LAPAROSCOPY  11/04/1999   with fulguration of endometriosis  . TRIGGER FINGER RELEASE Right 09/24/2012   Procedure: RELEASE A-1 PULLEY OF RIGHT THUMB;  Surgeon: Wynonia Sours, MD;  Location: [place];  Service: Orthopedics;  Laterality: Right;  . TUBAL LIGATION  1993    POb/GynH:      OB History    Gravida  1   Para  1   Term      Preterm      AB      Living        SAB      TAB      Ectopic      Multiple      Live Births  1           SH:   Social History   Tobacco Use  . Smoking status: Never Smoker  . Smokeless tobacco: Never Used  Substance Use Topics  . Alcohol use: No  . Drug use: No    FH:    Family History  Problem Relation Age of Onset  . Hypertension Father   . Hyperlipidemia Father   . COPD Father   . COPD Mother   . Heart disease Mother   . Cancer Mother        Cervix  . Anesthesia problems Mother        post-op N/V  . Thyroid disease Mother   . Diabetes Maternal Grandfather   . Thyroid disease Sister   . Anesthesia problems Sister        post-op N/V  . Thyroid disease Paternal Uncle   . Diabetes Other      Allergies:  Allergies  Allergen Reactions  . Qvar [Beclomethasone] Shortness Of Breath    Patient states that this medication causes her to feel short of breath  . Codeine Nausea And Vomiting  . Prozac [Fluoxetine Hcl] Hives  . Dyazide [Hydrochlorothiazide W-Triamterene] Cough  . Erythromycin Nausea Only  . Lisinopril Cough  . Pravastatin Other (See Comments)    BODY ACHES, FLU-LIKE SX.  . Sulfonamide Derivatives Rash     Medications:       Current Facility-Administered Medications:  .  bupivacaine liposome (EXPAREL) 1.3 % injection 266 mg, 20 mL, Infiltration, Once, Eure, Luther H, MD .  ceFAZolin (ANCEF) IVPB 2g/100 mL premix, 2 g, Intravenous, On Call to OR, Florian Buff, MD .  lactated ringers infusion, , Intravenous, Continuous, Nicanor Alcon, MD, Last Rate: 50 mL/hr at 06/06/17 1034  Review of  Systems:   Review of Systems  Constitutional: Negative for fever, chills, weight loss, malaise/fatigue and diaphoresis.  HENT: Negative for hearing loss, ear pain, nosebleeds, congestion, sore throat, neck pain, tinnitus and ear discharge.   Eyes: Negative for blurred vision, double vision, photophobia, pain, discharge and redness.  Respiratory: Negative for cough, hemoptysis, sputum production, shortness of breath, wheezing and stridor.   Cardiovascular: Negative for chest pain, palpitations, orthopnea, claudication, leg swelling and PND.  Gastrointestinal: Positive for abdominal pain. Negative for heartburn, nausea, vomiting, diarrhea, constipation, blood in stool and melena.  Genitourinary: Negative for dysuria, urgency, frequency, hematuria and flank pain.  Musculoskeletal: Negative for myalgias, back pain, joint pain and falls.  Skin: Negative for itching and rash.  Neurological: Negative for dizziness, tingling, tremors, sensory change, speech change, focal weakness, seizures, loss of consciousness, weakness and headaches.  Endo/Heme/Allergies: Negative for environmental allergies and polydipsia. Does not bruise/bleed easily.  Psychiatric/Behavioral: Negative for depression, suicidal ideas, hallucinations, memory loss and substance abuse. The patient is not nervous/anxious and does not have insomnia.      PHYSICAL EXAM:  Blood pressure (!) 130/92, pulse 74, temperature 98.5 F (36.9 C), temperature source Oral, resp. rate 10, last menstrual period 07/10/2013, SpO2 96 %.    Vitals  reviewed. Constitutional: She is oriented to person, place, and time. She appears well-developed and well-nourished.  HENT:  Head: Normocephalic and atraumatic.  Right Ear: External ear normal.  Left Ear: External ear normal.  Nose: Nose normal.  Mouth/Throat: Oropharynx is clear and moist.  Eyes: Conjunctivae and EOM are normal. Pupils are equal, round, and reactive to light. Right eye exhibits no discharge. Left eye exhibits no discharge. No scleral icterus.  Neck: Normal range of motion. Neck supple. No tracheal deviation present. No thyromegaly present.  Cardiovascular: Normal rate, regular rhythm, normal heart sounds and intact distal pulses.  Exam reveals no gallop and no friction rub.   No murmur heard. Respiratory: Effort normal and breath sounds normal. No respiratory distress. She has no wheezes. She has no rales. She exhibits no tenderness.  GI: Soft. Bowel sounds are normal. She exhibits no distension and no mass. There is tenderness. There is no rebound and no guarding.  Genitourinary:       Vulva is normal without lesions Vagina is pink moist without discharge Cervix normal in appearance and pap is normal Uterus is 10 week size tender to palpation Adnexa is negative with normal sized ovaries by sonogram  Musculoskeletal: Normal range of motion. She exhibits no edema and no tenderness.  Neurological: She is alert and oriented to person, place, and time. She has normal reflexes. She displays normal reflexes. No cranial nerve deficit. She exhibits normal muscle tone. Coordination normal.  Skin: Skin is warm and dry. No rash noted. No erythema. No pallor.  Psychiatric: She has a normal mood and affect. Her behavior is normal. Judgment and thought content normal.    Labs: Results for orders placed or performed during the hospital encounter of 06/06/17 (from the past 336 hour(s))  Glucose, capillary   Collection Time: 06/06/17  9:21 AM  Result Value Ref Range    Glucose-Capillary 182 (H) 65 - 99 mg/dL  Results for orders placed or performed during the hospital encounter of 06/01/17 (from the past 336 hour(s))  CBC   Collection Time: 06/01/17  1:11 PM  Result Value Ref Range   WBC 9.1 4.0 - 10.5 K/uL   RBC 4.96 3.87 - 5.11 MIL/uL   Hemoglobin 15.2 (H) 12.0 -  15.0 g/dL   HCT 44.7 36.0 - 46.0 %   MCV 90.1 78.0 - 100.0 fL   MCH 30.6 26.0 - 34.0 pg   MCHC 34.0 30.0 - 36.0 g/dL   RDW 12.0 11.5 - 15.5 %   Platelets 282 150 - 400 K/uL  Comprehensive metabolic panel   Collection Time: 06/01/17  1:11 PM  Result Value Ref Range   Sodium 134 (L) 135 - 145 mmol/L   Potassium 3.8 3.5 - 5.1 mmol/L   Chloride 104 101 - 111 mmol/L   CO2 21 (L) 22 - 32 mmol/L   Glucose, Bld 294 (H) 65 - 99 mg/dL   BUN 10 6 - 20 mg/dL   Creatinine, Ser 0.68 0.44 - 1.00 mg/dL   Calcium 9.0 8.9 - 10.3 mg/dL   Total Protein 6.6 6.5 - 8.1 g/dL   Albumin 3.8 3.5 - 5.0 g/dL   AST 26 15 - 41 U/L   ALT 22 14 - 54 U/L   Alkaline Phosphatase 113 38 - 126 U/L   Total Bilirubin 0.4 0.3 - 1.2 mg/dL   GFR calc non Af Amer >60 >60 mL/min   GFR calc Af Amer >60 >60 mL/min   Anion gap 9 5 - 15  hCG, quantitative, pregnancy   Collection Time: 06/01/17  1:11 PM  Result Value Ref Range   hCG, Beta Chain, Quant, S <1 <5 mIU/mL  Rapid HIV screen (HIV 1/2 Ab+Ag)   Collection Time: 06/01/17  1:11 PM  Result Value Ref Range   HIV-1 P24 Antigen - HIV24 NON REACTIVE NON REACTIVE   HIV 1/2 Antibodies NON REACTIVE NON REACTIVE   Interpretation (HIV Ag Ab)      A non reactive test result means that HIV 1 or HIV 2 antibodies and HIV 1 p24 antigen were not detected in the specimen.  Urinalysis, Routine w reflex microscopic   Collection Time: 06/01/17  1:11 PM  Result Value Ref Range   Color, Urine STRAW (A) YELLOW   APPearance CLEAR CLEAR   Specific Gravity, Urine 1.005 1.005 - 1.030   pH 6.0 5.0 - 8.0   Glucose, UA >=500 (A) NEGATIVE mg/dL   Hgb urine dipstick NEGATIVE NEGATIVE    Bilirubin Urine NEGATIVE NEGATIVE   Ketones, ur NEGATIVE NEGATIVE mg/dL   Protein, ur NEGATIVE NEGATIVE mg/dL   Nitrite NEGATIVE NEGATIVE   Leukocytes, UA NEGATIVE NEGATIVE   RBC / HPF 0-5 0 - 5 RBC/hpf   WBC, UA 0-5 0 - 5 WBC/hpf   Bacteria, UA RARE (A) NONE SEEN   Mucus PRESENT   Type and screen   Collection Time: 06/01/17  1:11 PM  Result Value Ref Range   ABO/RH(D) O POS    Antibody Screen NEG    Sample Expiration 06/15/2017    Extend sample reason      NO TRANSFUSIONS OR PREGNANCY IN THE PAST 3 MONTHS Performed at [facility], [address] [place], [place] [address]   Hemoglobin A1c   Collection Time: 06/01/17  1:17 PM  Result Value Ref Range   Hgb A1c MFr Bld 7.0 (H) 4.8 - 5.6 %   Mean Plasma Glucose 154.2 mg/dL  Glucose, capillary   Collection Time: 06/01/17  1:24 PM  Result Value Ref Range   Glucose-Capillary 271 (H) 65 - 99 mg/dL    EKG: Orders placed or performed during the hospital encounter of 06/01/17  . EKG 12-Lead  . EKG 12-Lead    Imaging Studies: US Pelvic Complete W Transvaginal  And Torsion R/o  Addendum Date: 05/15/2017   ADDENDUM REPORT: 05/15/2017 10:18 ADDENDUM: Endometrium was incorrectly measured on the initial exam. Endometrial canal contains a significant amount of complicated fluid question blood. Endometrium measures 4 mm thick. Discussed with Dr. Elonda Husky. Electronically Signed   By: Lavonia Dana M.D.   On: 05/15/2017 10:18   Result Date: 05/15/2017 CLINICAL DATA:  Severe pelvic pain.  Lower abdominal pain. EXAM: TRANSABDOMINAL AND TRANSVAGINAL ULTRASOUND OF PELVIS TECHNIQUE: Both transabdominal and transvaginal ultrasound examinations of the pelvis were performed. Transabdominal technique was performed for global imaging of the pelvis including uterus, ovaries, adnexal regions, and pelvic cul-de-sac. It was necessary to proceed with endovaginal exam following the transabdominal exam to visualize the uterus and endometrium. COMPARISON:  04/10/2017.  FINDINGS: Uterus Measurements: 13.7 x 9.3 x 9.4 cm. 8.1 cm mass consistent with fibroid noted along the posterior left uterus. Large complex nabothian cyst measuring 4.5 cm noted. Endometrium Thickness: 27 mm.  No focal abnormality visualized. Right ovary Measurements: 3.0 x 2.5 x 2.7 cm. Normal appearance/no adnexal mass. Left ovary Measurements: 3.0 x 2.8 x 3.1 cm. Normal appearance/no adnexal mass. Other findings No abnormal free fluid. IMPRESSION: 1.  8.1 cm fibroid.  Large 4.5 cm complex nabothian cyst. 2. Severely thickened endometrium and or endometrial complex fluid collection at 27 mm. Endometrial malignancy cannot be excluded. Gynecologic evaluation is suggested. Electronically Signed: By: Marcello Moores  Register On: 05/11/2017 15:50      Assessment: Degenerating myoma causing severe pelvic pain Fibroids Minimal post ablation hematometra which I don not think is primary source of pain Pt desires definitive management  Patient Active Problem List   Diagnosis Date Noted  . Derangement of posterior horn of medial meniscus of right knee   . Right knee pain 09/10/2015  . Iron deficiency anemia 10/16/2013  . Fibroids 08/21/2013  . Excessive or frequent menstruation 06/19/2013  . Morbid obesity ([place]) 07/12/2012  . Obstructive sleep apnea 06/21/2012  . Chest pain   . Hyperlipidemia   . Type 2 diabetes mellitus ([place])   . Obesity   . Laboratory test   . Nephrolithiasis   . Asthma   . Endometriosis   . Hypertension   . Iron deficiency anemia 05/11/2009  . GERD 05/11/2009    Plan: TAH BSO  Pt understands the risks of surgery including but not limited t  excessive bleeding requiring transfusion or reoperation, post-operative infection requiring prolonged hospitalization or re-hospitalization and antibiotic therapy, and damage to other organs including bladder, bowel, ureters and major vessels.  The patient also understands the alternative treatment options which were discussed in full.  All  questions were answered.  Florian Buff 06/06/2017 10:42 AM   Florian Buff 06/06/2017 10:40 AM

## 2017-06-06 NOTE — Anesthesia Procedure Notes (Signed)
Procedure Name: Intubation Date/Time: 06/06/2017 11:03 AM Performed by: Georgeanne Nim, CRNA Pre-anesthesia Checklist: Patient identified, Emergency Drugs available, Suction available, Patient being monitored and Timeout performed Patient Re-evaluated:Patient Re-evaluated prior to induction Oxygen Delivery Method: Circle system utilized Preoxygenation: Pre-oxygenation with 100% oxygen Induction Type: IV induction Ventilation: Mask ventilation without difficulty and Oral airway inserted - appropriate to patient size Tube size: 7.0 mm Number of attempts: 1 Airway Equipment and Method: Stylet Secured at: 21 cm Tube secured with: Tape Dental Injury: Teeth and Oropharynx as per pre-operative assessment

## 2017-06-06 NOTE — Op Note (Signed)
Preoperative diagnosis:  1.  Enlarged fibroid uterus with fibroid degeneration                                          2.  Pelvic pain, chronic                                         3.  S/p endometrial ablation                                            Postoperative diagnosis:  Same as above  Procedure:  Abdominal hysterectomy, total, with removal of both tubes and ovaries  Surgeon:  Florian Buff  Assistant:    Anesthesia:  General endotracheal  Preoperative clinical summary:  Carrie Cook is a 57 y.o. G1P1 with Patient's last menstrual period was 07/10/2013. admitted for a TAH BSO.  Previous notes: Is in today as a consult from the emergency department Carrie Cook presented with acute onset of lower abdominal pain And vaginal bleeding Workup included an ultrasound which reveals hematometra as well as a degenerating 8 cm fibroid since that time she is continued to have some bleeding And her pain has subsequently subsided acutely but it is still There she rates her daily pain at about a 4 but the pain last week was about 8-10  Carrie Cook has a long-standing myoma which we have defined prior to even do doing her endometrial ablation 3+ years ago unfortunately she is having increasing pain from it probably secondary to degeneration  her daily life is interrupted as a result of it,additionally with a hematometra we discussed options including conservative management and she wants to proceed with definitive surgical management  She is scheduled for a abdominal hysterectomy with bilateral salpingo-oophorectomy on Jun 06, 2017    Intraoperative findings: enlarged fibroid uterus, one which was left parametrial interfering with the blood supply on the left  Description of operation:  Patient was taken to the operating room and placed in the supine position where she underwent general endotracheal anesthesia.  She was then prepped and draped in the usual sterile fashion and a  Foley catheter was placed for continuous bladder drainage.  A Pfannenstiel skin incision was made and carried down sharply to the rectus fascia which was scored in the midline and extended laterally.  The fascia was taken off the muscles superiorly and inferiorly without difficulty.  The muscles were divided.  The peritoneal cavity was entered.  An medium Alexis self-retaining retractor was placed.  The upper abdomen was packed away. Both uterine cornu were grasped with Coker clamps.  The left round ligament was suture ligated and coagulated with the electrocautery unit.  The left vesicouterine serosal flap was created.  An avascular window in in the peritoneum was created and the infundibulo pelvic  ligament was cross clamped, cut and suture ligated.  The right round ligament was suture ligated and cut with the electrocautery unit.  The vesicouterine serosal flap on the right was created.  An avascular window in the peritoneum was created and the right infundibulopelvic ligament was cross clamped, cut and double suture ligated.  Thus both ovaries and tubes were removed.  The  uterine vessels were skeletonized bilaterally.  The uterine vessels were clamped bilaterally,  then cut and suture ligated.  Two more pedicles were taken down the cervix medial to the uterine vessels.  Each pedicle was clamped cut and suture ligated with good resulting hemostasis. The vagina was cross clamped and the cervix was removed.  The vagina was closed with figure of eight sutures with good hemostasis.  The pelvis was irrigated vigorously and all pedicles were examined and found to be hemostatic.    All specimens were sent to pathology for routine evaluation.  The Alexis self-retaining retractor was removed and the pelvis was irrigated vigorously.  All packs were removed and all counts were correct at this point x 3.  The muscles and peritoneum were reapproximated loosely.  The fascia was closed with 0 Vicryl running.  The subcutaneous  tissue was reapproximated using 2-0 plain gut.  The skin was closed using skin staples.   The patient was awakened from anesthesia taken to the recovery room in good stable condition. All sponge instrument and needle counts were correct x 3.  The patient received Ancef and Toradol prophylactically preoperatively.  Estimated blood loss for the procedure was 750  cc.  Florian Buff 06/06/2017 12:54 PM

## 2017-06-06 NOTE — Anesthesia Postprocedure Evaluation (Signed)
Anesthesia Post Note  Patient: Carrie Cook  Procedure(s) Performed: HYSTERECTOMY ABDOMINAL (N/A Abdomen) SALPINGO OOPHORECTOMY (Bilateral Abdomen)  Patient location during evaluation: PACU Anesthesia Type: General Level of consciousness: awake and alert Pain management: pain level controlled Vital Signs Assessment: post-procedure vital signs reviewed and stable Respiratory status: spontaneous breathing and patient connected to nasal cannula oxygen Cardiovascular status: stable Postop Assessment: no apparent nausea or vomiting Anesthetic complications: no     Last Vitals:  Vitals:   06/06/17 1305 06/06/17 1315  BP: (!) 158/91 (!) 178/99  Pulse:  88  Resp: 16 19  Temp: 37 C   SpO2: 96% 92%    Last Pain:  Vitals:   06/06/17 1330  TempSrc:   PainSc: 6                  ADAMS, AMY A

## 2017-06-06 NOTE — Transfer of Care (Signed)
Immediate Anesthesia Transfer of Care Note  Patient: Carrie Cook  Procedure(s) Performed: HYSTERECTOMY ABDOMINAL (N/A Abdomen) SALPINGO OOPHORECTOMY (Bilateral Abdomen)  Patient Location: PACU  Anesthesia Type:General  Level of Consciousness: awake and patient cooperative  Airway & Oxygen Therapy: Patient Spontanous Breathing and Patient connected to nasal cannula oxygen  Post-op Assessment: Report given to RN and Post -op Vital signs reviewed and stable  Post vital signs: Reviewed and stable  Last Vitals:  Vitals Value Taken Time  BP    Temp    Pulse    Resp    SpO2      Last Pain:  Vitals:   06/06/17 0926  TempSrc: Oral  PainSc: 0-No pain         Complications: No apparent anesthesia complications

## 2017-06-06 NOTE — Addendum Note (Signed)
Addendum  created 06/06/17 1352 by Mickel Baas, CRNA   Charge Capture section accepted

## 2017-06-07 ENCOUNTER — Encounter (HOSPITAL_COMMUNITY): Payer: Self-pay | Admitting: Obstetrics & Gynecology

## 2017-06-07 LAB — BASIC METABOLIC PANEL
Anion gap: 8 (ref 5–15)
BUN: 9 mg/dL (ref 6–20)
CO2: 24 mmol/L (ref 22–32)
Calcium: 8 mg/dL — ABNORMAL LOW (ref 8.9–10.3)
Chloride: 100 mmol/L — ABNORMAL LOW (ref 101–111)
Creatinine, Ser: 0.67 mg/dL (ref 0.44–1.00)
GFR calc Af Amer: >60 mL/min (ref >60)
GFR calc non Af Amer: >60 mL/min (ref >60)
Glucose, Bld: 239 mg/dL — ABNORMAL HIGH (ref 65–99)
Potassium: 4.6 mmol/L (ref 3.5–5.1)
Sodium: 132 mmol/L — ABNORMAL LOW (ref 135–145)

## 2017-06-07 LAB — CBC
HCT: 30.4 % — ABNORMAL LOW (ref 36.0–46.0)
Hemoglobin: 10.3 g/dL — ABNORMAL LOW (ref 12.0–15.0)
MCH: 30.8 pg (ref 26.0–34.0)
MCHC: 33.9 g/dL (ref 30.0–36.0)
MCV: 91 fL (ref 78.0–100.0)
Platelets: 221 10*3/uL (ref 150–400)
RBC: 3.34 MIL/uL — ABNORMAL LOW (ref 3.87–5.11)
RDW: 12.2 % (ref 11.5–15.5)
WBC: 11.5 10*3/uL — ABNORMAL HIGH (ref 4.0–10.5)

## 2017-06-07 MED ORDER — ONDANSETRON HCL 8 MG PO TABS: 8.0000 mg | ORAL_TABLET | Freq: Four times a day (QID) | ORAL | 0 refills | Status: DC | PRN

## 2017-06-07 MED ORDER — OXYCODONE-ACETAMINOPHEN 5-325 MG PO TABS: 1.0000 | ORAL_TABLET | ORAL | 0 refills | Status: DC | PRN

## 2017-06-07 MED ORDER — KETOROLAC TROMETHAMINE 10 MG PO TABS: 10.0000 mg | ORAL_TABLET | Freq: Three times a day (TID) | ORAL | 0 refills | Status: DC | PRN

## 2017-06-07 MED ORDER — CEPHALEXIN 500 MG PO CAPS: 500.0000 mg | ORAL_CAPSULE | Freq: Three times a day (TID) | ORAL | 0 refills | Status: DC

## 2017-06-07 MED ORDER — LEVOFLOXACIN IN D5W 750 MG/150ML IV SOLN
750.0000 mg | Freq: Once | INTRAVENOUS | Status: AC
  Administered 2017-06-07: 750 mg via INTRAVENOUS
  Filled 2017-06-07: qty 150

## 2017-06-07 NOTE — Addendum Note (Signed)
Addendum  created 06/07/17 1329 by Mickel Baas, CRNA   Sign clinical note

## 2017-06-07 NOTE — Addendum Note (Signed)
Addendum  created 06/07/17 0759 by Georgeanne Nim, CRNA   Intraprocedure Staff edited

## 2017-06-07 NOTE — Discharge Summary (Signed)
Physician Discharge Summary  Patient ID: Carrie Cook MRN: [id number] DOB/AGE: 55-30-1962 55 y.o.  Admit date: 06/06/2017 Discharge date: 06/07/2017  Admission Diagnoses: Degenerating myoma Pelvic pain Discharge Diagnoses:  Active Problems:   S/P complete hysterectomy   Discharged Condition: good  Hospital Course: post op urinary retention due to diabetes and pelvic surgery, will leave foley in for the week  Consults: None  Significant Diagnostic Studies: labs:   Treatments: antibiotics: Levaquin and surgery: TAH BSO  Discharge Exam: Blood pressure 121/71, pulse 89, temperature 98.5 F (36.9 C), temperature source Oral, resp. rate 18, height 5\' 3"  (1.6 m), weight 193 lb (87.5 kg), last menstrual period 07/10/2013, SpO2 96 %. General appearance: alert, cooperative and no distress GI: soft, non-tender; bowel sounds normal; no masses,  no organomegaly Incision/Wound:clean dry intact  Disposition: Discharge disposition: 01-Home or Self Care       Discharge Instructions    Call MD for:  persistant nausea and vomiting   Complete by:  As directed    Call MD for:  severe uncontrolled pain   Complete by:  As directed    Call MD for:  temperature >100.4   Complete by:  As directed    Diet - low sodium heart healthy   Complete by:  As directed    Discharge instructions   Complete by:  As directed    Leave foley in until seen in office next week   Driving Restrictions   Complete by:  As directed    No driving for 1 week   Increase activity slowly   Complete by:  As directed    Leave dressing on - Keep it clean, dry, and intact until clinic visit   Complete by:  As directed    Lifting restrictions   Complete by:  As directed    Do not lift more than 10 pounds for 6 weeks   Sexual Activity Restrictions   Complete by:  As directed    No sex for 6 weeks     Allergies as of 06/07/2017      Reactions   Qvar [beclomethasone] Shortness Of Breath   Patient states that  this medication causes her to feel short of breath   Codeine Nausea And Vomiting   Prozac [fluoxetine Hcl] Hives   Dyazide [hydrochlorothiazide W-triamterene] Cough   Erythromycin Nausea Only   Lisinopril Cough   Pravastatin Other (See Comments)   BODY ACHES, FLU-LIKE SX.   Sulfonamide Derivatives Rash      Medication List    STOP taking these medications   acetaminophen 500 MG tablet Commonly known as:  TYLENOL   ibuprofen 200 MG tablet Commonly known as:  ADVIL,MOTRIN   medroxyPROGESTERone 10 MG tablet Commonly known as:  PROVERA   progesterone 200 MG capsule Commonly known as:  PROMETRIUM   traMADol 50 MG tablet Commonly known as:  ULTRAM     TAKE these medications   albuterol 108 (90 Base) MCG/ACT inhaler Commonly known as:  PROVENTIL HFA;VENTOLIN HFA Inhale 2 puffs into the lungs every 6 (six) hours as needed for wheezing.   atorvastatin 10 MG tablet Commonly known as:  LIPITOR TAKE 1 TABLET(10 MG) BY MOUTH DAILY What changed:    how much to take  how to take this  when to take this  additional instructions   cephALEXin 500 MG capsule Commonly known as:  KEFLEX Take 1 capsule (500 mg total) by mouth 3 (three) times daily.   cetirizine 10 MG tablet Commonly known  as:  ZYRTEC Take 10 mg by mouth daily.   estradiol 2 MG tablet Commonly known as:  ESTRACE TAKE 1 TABLET BY MOUTH DAILY What changed:    how much to take  how to take this  when to take this   fluticasone 220 MCG/ACT inhaler Commonly known as:  FLOVENT HFA Inhale 2 puffs into the lungs 2 (two) times daily.   hydrocortisone 1 % Crea Commonly known as:  PROCTO-PAK Apply 1 application topically 2 (two) times daily.   hydrocortisone 2.5 % rectal cream Commonly known as:  ANUSOL-HC Place 1 application rectally 2 (two) times daily.   Insulin Glargine 100 UNIT/ML Solostar Pen Commonly known as:  LANTUS SOLOSTAR Use 12 to 14 nightly may titrate up to 24 units What changed:     how much to take  how to take this  when to take this  additional instructions   ketorolac 10 MG tablet Commonly known as:  TORADOL Take 1 tablet (10 mg total) by mouth every 8 (eight) hours as needed. What changed:  Another medication with the same name was added. Make sure you understand how and when to take each.   ketorolac 10 MG tablet Commonly known as:  TORADOL Take 1 tablet (10 mg total) by mouth every 8 (eight) hours as needed. What changed:  You were already taking a medication with the same name, and this prescription was added. Make sure you understand how and when to take each.   lamoTRIgine 100 MG tablet Commonly known as:  LAMICTAL TAKE 1 TABLET BY MOUTH AT BEDTIME.   losartan 100 MG tablet Commonly known as:  COZAAR Take 1 tablet (100 mg total) by mouth daily.   metFORMIN 500 MG tablet Commonly known as:  GLUCOPHAGE TAKE 1 TABLET BY MOUTH TWICE DAILY WITH A MEAL What changed:    how much to take  how to take this  when to take this  additional instructions   metoprolol tartrate 25 MG tablet Commonly known as:  LOPRESSOR Take 1 tablet (25 mg total) by mouth 2 (two) times daily.   multivitamin with minerals Tabs tablet Take 1 tablet by mouth 2 (two) times daily.   ondansetron 8 MG tablet Commonly known as:  ZOFRAN Take 1 tablet (8 mg total) by mouth every 6 (six) hours as needed for nausea.   oxyCODONE-acetaminophen 5-325 MG tablet Commonly known as:  PERCOCET Take 1-2 tablets by mouth every 4 (four) hours as needed for severe pain.   venlafaxine 75 MG tablet Commonly known as:  EFFEXOR Take 75 mg by mouth 2 (two) times daily with a meal. TAKE 1 TABLET (75 MG) IN THE MORNING, AND 2 TABLETS (150 MG) BY MOUTH IN THE EVENING.   Vitamin B-12 CR 1500 MCG Tbcr Take 1,500 mcg by mouth daily.      Follow-up Information    Florian Buff, MD Follow up on 06/14/2017.   Specialties:  Obstetrics and Gynecology, Radiology Contact information: [place] [address] [phone]           Signed: Florian Buff 06/07/2017, 1:26 PM

## 2017-06-07 NOTE — Progress Notes (Signed)
Inpatient Diabetes Program Recommendations  AACE/ADA: New Consensus Statement on Inpatient Glycemic Control (2019)  Target Ranges:  Prepandial:   less than 140 mg/dL      Peak postprandial:   less than 180 mg/dL (1-2 hours)      Critically ill patients:  140 - 180 mg/dL  Results for DAANA, PETRASEK (MRN [id number]) as of 06/07/2017 07:22  Ref. Range 06/07/2017 05:19  Glucose Latest Ref Range: 65 - 99 mg/dL 239 (H)   Results for LYNETTA, TOMCZAK (MRN [id number]) as of 06/07/2017 07:22  Ref. Range 06/06/2017 09:21 06/06/2017 13:09 06/06/2017 21:40  Glucose-Capillary Latest Ref Range: 65 - 99 mg/dL 182 (H) 173 (H) 260 (H)   Results for KATALEA, UCCI (MRN [id number]) as of 06/07/2017 07:22  Ref. Range 06/01/2017 13:17  Hemoglobin A1C Latest Ref Range: 4.8 - 5.6 % 7.0 (H)   Review of Glycemic Control  Diabetes history: DM2 Outpatient Diabetes medications: Lantus 14 units QHS, Metformin 500 mg BID Current orders for Inpatient glycemic control: Lantus 14 units QHS, Metformin 500 mg BID  Inpatient Diabetes Program Recommendations: Correction (SSI): While inpatient, please consider ordering CBGs with Novolog 0-9 units TID with meals and Novolog 0-5 units QHS for bedtime correction.  Thanks, Barnie Alderman, RN, MSN, CDE Diabetes Coordinator Inpatient Diabetes Program [phone] (Team Pager from 8am to 5pm)

## 2017-06-07 NOTE — Discharge Instructions (Signed)
Abdominal Hysterectomy, Care After °This sheet gives you information about how to care for yourself after your procedure. Your health care provider may also give you more specific instructions. If you have problems or questions, contact your health care provider. °What can I expect after the procedure? °After your procedure, it is common to have: °· Pain. °· Fatigue. °· Poor appetite. °· Less interest in sex. °· Vaginal bleeding and discharge. You may need to use a sanitary napkin after this procedure. ° °Follow these instructions at home: °Bathing °· Do not take baths, swim, or use a hot tub until your health care provider approves. Ask your health care provider if you can take showers. You may only be allowed to take sponge baths for bathing. °· Keep the bandage (dressing) dry until your health care provider says it can be removed. °Incision care °· Follow instructions from your health care provider about how to take care of your incision. Make sure you: °? Wash your hands with soap and water before you change your bandage (dressing). If soap and water are not available, use hand sanitizer. °? Change your dressing as told by your health care provider. °? Leave stitches (sutures), skin glue, or adhesive strips in place. These skin closures may need to stay in place for 2 weeks or longer. If adhesive strip edges start to loosen and curl up, you may trim the loose edges. Do not remove adhesive strips completely unless your health care provider tells you to do that. °· Check your incision area every day for signs of infection. Check for: °? Redness, swelling, or pain. °? Fluid or blood. °? Warmth. °? Pus or a bad smell. °Activity °· Do gentle, daily exercises as told by your health care provider. You may be told to take short walks every day and go farther each time. °· Do not lift anything that is heavier than 10 lb (4.5 kg), or the limit that your health care provider tells you, until he or she says that it is  safe. °· Do not drive or use heavy machinery while taking prescription pain medicine. °· Do not drive for 24 hours if you were given a medicine to help you relax (sedative). °· Follow your health care provider's instructions about exercise, driving, and general activities. Ask your health care provider what activities are safe for you. °Lifestyle °· Do not douche, use tampons, or have sex for at least 6 weeks or as told by your health care provider. °· Do not drink alcohol until your health care provider approves. °· Drink enough fluid to keep your urine clear or pale yellow. °· Try to have someone at home with you for the first 1-2 weeks to help. °· Do not use any products that contain nicotine or tobacco, such as cigarettes and e-cigarettes. These can delay healing. If you need help quitting, ask your health care provider. °General instructions °· Take over-the-counter and prescription medicines only as told by your health care provider. °· Do not take aspirin or ibuprofen. These medicines can cause bleeding. °· To prevent or treat constipation while you are taking prescription pain medicine, your health care provider may recommend that you: °? Drink enough fluid to keep your urine clear or pale yellow. °? Take over-the-counter or prescription medicines. °? Eat foods that are high in fiber, such as fresh fruits and vegetables, whole grains, and beans. °? Limit foods that are high in fat and processed sugars, such as fried and sweet foods. °· Keep all   follow-up visits as told by your health care provider. This is important. °Contact a health care provider if: °· You have chills or fever. °· You have redness, swelling, or pain around your incision. °· You have fluid or blood coming from your incision. °· Your incision feels warm to the touch. °· You have pus or a bad smell coming from your incision. °· Your incision breaks open. °· You feel dizzy or light-headed. °· You have pain or bleeding when you urinate. °· You  have persistent diarrhea. °· You have persistent nausea and vomiting. °· You have abnormal vaginal discharge. °· You have a rash. °· You have any type of abnormal reaction or you develop an allergy to your medicine. °· Your pain medicine does not help. °Get help right away if: °· You have a fever and your symptoms suddenly get worse. °· You have severe abdominal pain. °· You have shortness of breath. °· You faint. °· You have pain, swelling, or redness in your leg. °· You have heavy vaginal bleeding with blood clots. °Summary °· After your procedure, it is common to have pain, fatigue and vaginal discharge. °· Do not take baths, swim, or use a hot tub until your health care provider approves. Ask your health care provider if you can take showers. You may only be allowed to take sponge baths for bathing. °· Follow your health care provider's instructions about exercise, driving, and general activities. Ask your health care provider what activities are safe for you. °· Do not lift anything that is heavier than 10 lb (4.5 kg), or the limit that your health care provider tells you, until he or she says that it is safe. °· Try to have someone at home with you for the first 1-2 weeks to help. °This information is not intended to replace advice given to you by your health care provider. Make sure you discuss any questions you have with your health care provider. °Document Released: 07/29/2004 Document Revised: 12/29/2015 Document Reviewed: 12/29/2015 °Elsevier Interactive Patient Education © 2017 [employer] ° °

## 2017-06-07 NOTE — Progress Notes (Signed)
Patient had not voided in  Over 6 hours since removal of foley. Patient reported feeling full, bladder scan showed over 300  ml. Notified Dr. Elonda Husky, order received to in and out x 1. 400 ml returned.

## 2017-06-07 NOTE — Anesthesia Postprocedure Evaluation (Signed)
Anesthesia Post Note  Patient: Carrie Cook  Procedure(s) Performed: HYSTERECTOMY ABDOMINAL (N/A Abdomen) SALPINGO OOPHORECTOMY (Bilateral Abdomen)  Patient location during evaluation: Nursing Unit Anesthesia Type: General Level of consciousness: awake and alert and oriented Pain management: pain level controlled Vital Signs Assessment: post-procedure vital signs reviewed and stable Respiratory status: spontaneous breathing Cardiovascular status: stable : Some nausea last night relieved with Zofran. Anesthetic complications: no     Last Vitals:  Vitals:   06/07/17 0400 06/07/17 0747  BP: 121/71   Pulse: 89   Resp: 18   Temp: 36.9 C   SpO2: 97% 96%    Last Pain:  Vitals:   06/07/17 0909  TempSrc:   PainSc: 0-No pain                 Merian Wroe A

## 2017-06-12 ENCOUNTER — Other Ambulatory Visit: Payer: Self-pay

## 2017-06-12 ENCOUNTER — Ambulatory Visit (INDEPENDENT_AMBULATORY_CARE_PROVIDER_SITE_OTHER): Payer: Medicare Other | Admitting: Obstetrics & Gynecology

## 2017-06-12 ENCOUNTER — Encounter: Payer: Self-pay | Admitting: Obstetrics & Gynecology

## 2017-06-12 VITALS — BP 138/82 | HR 105 | Ht 63.0 in | Wt 194.0 lb

## 2017-06-12 DIAGNOSIS — Z9889 Other specified postprocedural states: Secondary | ICD-10-CM

## 2017-06-12 DIAGNOSIS — Z90710 Acquired absence of both cervix and uterus: Secondary | ICD-10-CM

## 2017-06-12 NOTE — Progress Notes (Signed)
HPI: Patient returns for routine postoperative follow-up having undergone TAH BSO on 06/06/2017.  The patient's immediate postoperative recovery has been unremarkable. Since hospital discharge the patient reports foley catheter is still in place, due to post operative bladder atony probably secondary to her diabetes.   Current Outpatient Medications: albuterol (PROVENTIL HFA;VENTOLIN HFA) 108 (90 Base) MCG/ACT inhaler, Inhale 2 puffs into the lungs every 6 (six) hours as needed for wheezing., Disp: 1 Inhaler, Rfl: 6 atorvastatin (LIPITOR) 10 MG tablet, TAKE 1 TABLET(10 MG) BY MOUTH DAILY (Patient taking differently: Take 10 mg by mouth daily at 2 PM. ), Disp: 90 tablet, Rfl: 1 cephALEXin (KEFLEX) 500 MG capsule, Take 1 capsule (500 mg total) by mouth 3 (three) times daily., Disp: 21 capsule, Rfl: 0 cetirizine (ZYRTEC) 10 MG tablet, Take 10 mg by mouth daily., Disp: , Rfl:  Cyanocobalamin (VITAMIN B-12 CR) 1500 MCG TBCR, Take 1,500 mcg by mouth daily., Disp: , Rfl:  estradiol (ESTRACE) 2 MG tablet, TAKE 1 TABLET BY MOUTH DAILY (Patient taking differently: TAKE 1 TABLET BY MOUTH DAILY AT NIGHT.), Disp: 90 tablet, Rfl: 3 fluticasone (FLOVENT HFA) 220 MCG/ACT inhaler, Inhale 2 puffs into the lungs 2 (two) times daily., Disp: 1 Inhaler, Rfl: 12 hydrocortisone (ANUSOL-HC) 2.5 % rectal cream, Place 1 application rectally 2 (two) times daily., Disp: 30 g, Rfl: 11 hydrocortisone (PROCTO-PAK) 1 % CREA, Apply 1 application topically 2 (two) times daily., Disp: 1 Tube, Rfl: 11 Insulin Glargine (LANTUS SOLOSTAR) 100 UNIT/ML Solostar Pen, Use 12 to 14 nightly may titrate up to 24 units (Patient taking differently: Inject 14 Units into the skin at bedtime. ), Disp: 5 pen, Rfl: 5 ketorolac (TORADOL) 10 MG tablet, Take 1 tablet (10 mg total) by mouth every 8 (eight) hours as needed., Disp: 15 tablet, Rfl: 0 lamoTRIgine (LAMICTAL) 100 MG tablet, TAKE 1 TABLET BY MOUTH AT BEDTIME., Disp: , Rfl: 3 losartan (COZAAR)  100 MG tablet, Take 1 tablet (100 mg total) by mouth daily., Disp: 90 tablet, Rfl: 1 metFORMIN (GLUCOPHAGE) 500 MG tablet, TAKE 1 TABLET BY MOUTH TWICE DAILY WITH A MEAL (Patient taking differently: Take 500 mg by mouth 2 (two) times daily. TAKE 1 TABLET BY MOUTH TWICE DAILY WITH A MEAL), Disp: 180 tablet, Rfl: 1 metoprolol tartrate (LOPRESSOR) 25 MG tablet, Take 1 tablet (25 mg total) by mouth 2 (two) times daily., Disp: 180 tablet, Rfl: 1 Multiple Vitamin (MULTIVITAMIN WITH MINERALS) TABS tablet, Take 1 tablet by mouth 2 (two) times daily. , Disp: , Rfl:  ondansetron (ZOFRAN) 8 MG tablet, Take 1 tablet (8 mg total) by mouth every 6 (six) hours as needed for nausea., Disp: 20 tablet, Rfl: 0 oxyCODONE-acetaminophen (PERCOCET) 5-325 MG tablet, Take 1-2 tablets by mouth every 4 (four) hours as needed for severe pain., Disp: 30 tablet, Rfl: 0 venlafaxine (EFFEXOR) 75 MG tablet, Take 75 mg by mouth 2 (two) times daily with a meal. TAKE 1 TABLET (75 MG) IN THE MORNING, AND 2 TABLETS (150 MG) BY MOUTH IN THE EVENING., Disp: , Rfl: 1 ketorolac (TORADOL) 10 MG tablet, Take 1 tablet (10 mg total) by mouth every 8 (eight) hours as needed. (Patient not taking: Reported on 04/10/2017), Disp: 15 tablet, Rfl: 0  No current facility-administered medications for this visit.     Blood pressure 138/82, pulse (!) 105, height 5\' 3"  (1.6 m), weight 194 lb (88 kg), last menstrual period 07/10/2013.  Physical Exam: Incision clean dry intact Abdomen soft 1/2 staples removed normal post op   Diagnostic Tests:  Pathology: benign  Impression: S/p TAH BSO  Plan: Foley removed and pt instructed on how to and when to self cath  Follow up: 1 week for remainder of staple removal Florian Buff, MD

## 2017-06-19 ENCOUNTER — Encounter: Payer: Self-pay | Admitting: Obstetrics & Gynecology

## 2017-06-19 ENCOUNTER — Ambulatory Visit (INDEPENDENT_AMBULATORY_CARE_PROVIDER_SITE_OTHER): Payer: Medicare Other | Admitting: Obstetrics & Gynecology

## 2017-06-19 VITALS — BP 120/80 | HR 94 | Ht 63.0 in | Wt 191.0 lb

## 2017-06-19 DIAGNOSIS — Z9889 Other specified postprocedural states: Secondary | ICD-10-CM

## 2017-06-19 DIAGNOSIS — Z90710 Acquired absence of both cervix and uterus: Secondary | ICD-10-CM

## 2017-06-19 NOTE — Progress Notes (Signed)
HPI: Patient returns for routine postoperative follow-up having undergone TAH BSO on 06/06/2017.  The patient's immediate postoperative recovery has been unremarkable. Since hospital discharge the patient reports foley catheter is still in place, due to post operative bladder atony probably secondary to her diabetes.   Current Outpatient Medications: albuterol (PROVENTIL HFA;VENTOLIN HFA) 108 (90 Base) MCG/ACT inhaler, Inhale 2 puffs into the lungs every 6 (six) hours as needed for wheezing., Disp: 1 Inhaler, Rfl: 6 atorvastatin (LIPITOR) 10 MG tablet, TAKE 1 TABLET(10 MG) BY MOUTH DAILY (Patient taking differently: Take 10 mg by mouth daily at 2 PM. ), Disp: 90 tablet, Rfl: 1 cephALEXin (KEFLEX) 500 MG capsule, Take 1 capsule (500 mg total) by mouth 3 (three) times daily., Disp: 21 capsule, Rfl: 0 cetirizine (ZYRTEC) 10 MG tablet, Take 10 mg by mouth daily., Disp: , Rfl:  Cyanocobalamin (VITAMIN B-12 CR) 1500 MCG TBCR, Take 1,500 mcg by mouth daily., Disp: , Rfl:  estradiol (ESTRACE) 2 MG tablet, TAKE 1 TABLET BY MOUTH DAILY (Patient taking differently: TAKE 1 TABLET BY MOUTH DAILY AT NIGHT.), Disp: 90 tablet, Rfl: 3 fluticasone (FLOVENT HFA) 220 MCG/ACT inhaler, Inhale 2 puffs into the lungs 2 (two) times daily., Disp: 1 Inhaler, Rfl: 12 hydrocortisone (ANUSOL-HC) 2.5 % rectal cream, Place 1 application rectally 2 (two) times daily., Disp: 30 g, Rfl: 11 hydrocortisone (PROCTO-PAK) 1 % CREA, Apply 1 application topically 2 (two) times daily., Disp: 1 Tube, Rfl: 11 Insulin Glargine (LANTUS SOLOSTAR) 100 UNIT/ML Solostar Pen, Use 12 to 14 nightly may titrate up to 24 units (Patient taking differently: Inject 14 Units into the skin at bedtime. ), Disp: 5 pen, Rfl: 5 ketorolac (TORADOL) 10 MG tablet, Take 1 tablet (10 mg total) by mouth every 8 (eight) hours as needed., Disp: 15 tablet, Rfl: 0 lamoTRIgine (LAMICTAL) 100 MG tablet, TAKE 1 TABLET BY MOUTH AT BEDTIME., Disp: , Rfl: 3 losartan (COZAAR)  100 MG tablet, Take 1 tablet (100 mg total) by mouth daily., Disp: 90 tablet, Rfl: 1 metFORMIN (GLUCOPHAGE) 500 MG tablet, TAKE 1 TABLET BY MOUTH TWICE DAILY WITH A MEAL (Patient taking differently: Take 500 mg by mouth 2 (two) times daily. TAKE 1 TABLET BY MOUTH TWICE DAILY WITH A MEAL), Disp: 180 tablet, Rfl: 1 metoprolol tartrate (LOPRESSOR) 25 MG tablet, Take 1 tablet (25 mg total) by mouth 2 (two) times daily., Disp: 180 tablet, Rfl: 1 Multiple Vitamin (MULTIVITAMIN WITH MINERALS) TABS tablet, Take 1 tablet by mouth 2 (two) times daily. , Disp: , Rfl:  ondansetron (ZOFRAN) 8 MG tablet, Take 1 tablet (8 mg total) by mouth every 6 (six) hours as needed for nausea., Disp: 20 tablet, Rfl: 0 oxyCODONE-acetaminophen (PERCOCET) 5-325 MG tablet, Take 1-2 tablets by mouth every 4 (four) hours as needed for severe pain., Disp: 30 tablet, Rfl: 0 venlafaxine (EFFEXOR) 75 MG tablet, Take 75 mg by mouth 2 (two) times daily with a meal. TAKE 1 TABLET (75 MG) IN THE MORNING, AND 2 TABLETS (150 MG) BY MOUTH IN THE EVENING., Disp: , Rfl: 1 ketorolac (TORADOL) 10 MG tablet, Take 1 tablet (10 mg total) by mouth every 8 (eight) hours as needed. (Patient not taking: Reported on 04/10/2017), Disp: 15 tablet, Rfl: 0  No current facility-administered medications for this visit.     Blood pressure 138/82, pulse (!) 105, height 5\' 3"  (1.6 m), weight 194 lb (88 kg), last menstrual period 07/10/2013.  Physical Exam: Incision clean dry intact Abdomen soft 1/2 staples removed normal post op   Diagnostic Tests:  Pathology: benign  Impression: S/p TAH BSO  Plan: Foley removed and pt instructed on how to and when to self cath  Follow up: Staples removed, gentian violet placed and bladder is working well Florian Buff, MD

## 2017-06-22 ENCOUNTER — Telehealth: Payer: Self-pay | Admitting: *Deleted

## 2017-06-22 NOTE — Telephone Encounter (Signed)
Patient called stating she is having constant pelvic pressure and burning with urination along with blood in her urine.  She has been self-cathing up until this past weekend.  She also is having some slight vaginal itching and thought she was getting a yeast infection but now thinks she has an UTI.  Please advise.

## 2017-06-25 ENCOUNTER — Telehealth: Payer: Self-pay | Admitting: *Deleted

## 2017-06-25 ENCOUNTER — Telehealth: Payer: Self-pay | Admitting: Obstetrics & Gynecology

## 2017-06-25 MED ORDER — CIPROFLOXACIN HCL 500 MG PO TABS: 500.0000 mg | ORAL_TABLET | Freq: Two times a day (BID) | ORAL | 0 refills | Status: DC

## 2017-06-25 NOTE — Telephone Encounter (Signed)
Meds ordered this encounter  Medications   ciprofloxacin (CIPRO) 500 MG tablet    Sig: Take 1 tablet (500 mg total) by mouth 2 (two) times daily.    Dispense:  14 tablet    Refill:  0    

## 2017-06-25 NOTE — Telephone Encounter (Signed)
Patient informed Dr Elonda Husky sent in [place].

## 2017-07-16 ENCOUNTER — Encounter: Payer: Self-pay | Admitting: Obstetrics & Gynecology

## 2017-07-16 ENCOUNTER — Ambulatory Visit (INDEPENDENT_AMBULATORY_CARE_PROVIDER_SITE_OTHER): Payer: Medicare Other | Admitting: Obstetrics & Gynecology

## 2017-07-16 VITALS — BP 104/71 | HR 80 | Ht 63.0 in | Wt 191.0 lb

## 2017-07-16 DIAGNOSIS — Z90710 Acquired absence of both cervix and uterus: Secondary | ICD-10-CM

## 2017-07-16 DIAGNOSIS — B9689 Other specified bacterial agents as the cause of diseases classified elsewhere: Secondary | ICD-10-CM

## 2017-07-16 DIAGNOSIS — N760 Acute vaginitis: Secondary | ICD-10-CM

## 2017-07-16 MED ORDER — METRONIDAZOLE 0.75 % VA GEL: VAGINAL | 0 refills | Status: DC

## 2017-07-16 NOTE — Progress Notes (Signed)
  HPI: Patient returns for routine postoperative follow-up having undergone TAHBSO on 06/06/2017.  The patient's immediate postoperative recovery has been unremarkable. Since hospital discharge the patient reports vaginal irritation, otherwise doing well.   Current Outpatient Medications: albuterol (PROVENTIL HFA;VENTOLIN HFA) 108 (90 Base) MCG/ACT inhaler, Inhale 2 puffs into the lungs every 6 (six) hours as needed for wheezing., Disp: 1 Inhaler, Rfl: 6 atorvastatin (LIPITOR) 10 MG tablet, TAKE 1 TABLET(10 MG) BY MOUTH DAILY (Patient taking differently: Take 10 mg by mouth daily at 2 PM. ), Disp: 90 tablet, Rfl: 1 cetirizine (ZYRTEC) 10 MG tablet, Take 10 mg by mouth daily., Disp: , Rfl:  Cyanocobalamin (VITAMIN B-12 CR) 1500 MCG TBCR, Take 1,500 mcg by mouth daily., Disp: , Rfl:  estradiol (ESTRACE) 2 MG tablet, TAKE 1 TABLET BY MOUTH DAILY (Patient taking differently: TAKE 1 TABLET BY MOUTH DAILY AT NIGHT.), Disp: 90 tablet, Rfl: 3 fluticasone (FLOVENT HFA) 220 MCG/ACT inhaler, Inhale 2 puffs into the lungs 2 (two) times daily., Disp: 1 Inhaler, Rfl: 12 Insulin Glargine (LANTUS SOLOSTAR) 100 UNIT/ML Solostar Pen, Use 12 to 14 nightly may titrate up to 24 units (Patient taking differently: Inject 14 Units into the skin at bedtime. ), Disp: 5 pen, Rfl: 5 ketorolac (TORADOL) 10 MG tablet, Take 1 tablet (10 mg total) by mouth every 8 (eight) hours as needed., Disp: 15 tablet, Rfl: 0 lamoTRIgine (LAMICTAL) 100 MG tablet, TAKE 1 TABLET BY MOUTH AT BEDTIME., Disp: , Rfl: 3 losartan (COZAAR) 100 MG tablet, Take 1 tablet (100 mg total) by mouth daily., Disp: 90 tablet, Rfl: 1 metFORMIN (GLUCOPHAGE) 500 MG tablet, TAKE 1 TABLET BY MOUTH TWICE DAILY WITH A MEAL (Patient taking differently: Take 500 mg by mouth 2 (two) times daily. TAKE 1 TABLET BY MOUTH TWICE DAILY WITH A MEAL), Disp: 180 tablet, Rfl: 1 metoprolol tartrate (LOPRESSOR) 25 MG tablet, Take 1 tablet (25 mg total) by mouth 2 (two) times daily.,  Disp: 180 tablet, Rfl: 1 Multiple Vitamin (MULTIVITAMIN WITH MINERALS) TABS tablet, Take 1 tablet by mouth 2 (two) times daily. , Disp: , Rfl:  venlafaxine (EFFEXOR) 75 MG tablet, Take 75 mg by mouth 2 (two) times daily with a meal. TAKE 1 TABLET (75 MG) IN THE MORNING, AND 2 TABLETS (150 MG) BY MOUTH IN THE EVENING., Disp: , Rfl: 1  No current facility-administered medications for this visit.     Blood pressure 104/71, pulse 80, height 5\' 3"  (1.6 m), weight 191 lb (86.6 kg), last menstrual period 07/10/2013.  Physical Exam: Incision clean dry intact Abdomen benign Vaginal cuff healing well  Diagnostic Tests:   Pathology: benign  Impression: S/p TAHBSO, normal post op course +BV  Plan: Meds ordered this encounter  Medications  . metroNIDAZOLE (METROGEL VAGINAL) 0.75 % vaginal gel    Sig: Nightly x 5 nights    Dispense:  70 g    Refill:  0     Follow up: 1  years  Florian Buff, MD

## 2017-07-22 ENCOUNTER — Other Ambulatory Visit: Payer: Self-pay | Admitting: Obstetrics & Gynecology

## 2017-08-03 DIAGNOSIS — F339 Major depressive disorder, recurrent, unspecified: Secondary | ICD-10-CM | POA: Diagnosis not present

## 2017-08-15 ENCOUNTER — Other Ambulatory Visit: Payer: Self-pay | Admitting: Family Medicine

## 2017-09-14 DIAGNOSIS — F339 Major depressive disorder, recurrent, unspecified: Secondary | ICD-10-CM | POA: Diagnosis not present

## 2017-09-17 ENCOUNTER — Ambulatory Visit (INDEPENDENT_AMBULATORY_CARE_PROVIDER_SITE_OTHER): Payer: Medicare Other | Admitting: Family Medicine

## 2017-09-17 ENCOUNTER — Encounter: Payer: Self-pay | Admitting: Family Medicine

## 2017-09-17 VITALS — BP 126/82 | Ht 63.0 in | Wt 193.8 lb

## 2017-09-17 DIAGNOSIS — E7849 Other hyperlipidemia: Secondary | ICD-10-CM

## 2017-09-17 DIAGNOSIS — Z794 Long term (current) use of insulin: Secondary | ICD-10-CM

## 2017-09-17 DIAGNOSIS — E119 Type 2 diabetes mellitus without complications: Secondary | ICD-10-CM

## 2017-09-17 DIAGNOSIS — I10 Essential (primary) hypertension: Secondary | ICD-10-CM | POA: Diagnosis not present

## 2017-09-17 LAB — POCT GLYCOSYLATED HEMOGLOBIN (HGB A1C): Hemoglobin A1C: 6.1 % — AB (ref 4.0–5.6)

## 2017-09-17 MED ORDER — PEN NEEDLES 32G X 4 MM MISC: 32.0000 g | Freq: Every day | 5 refills | Status: DC

## 2017-09-17 NOTE — Progress Notes (Signed)
Subjective:    Patient ID: Carrie Cook, female    DOB: 02-Sep-1960, 57 y.o.   MRN: [id number]  Diabetes  She presents for her follow-up diabetic visit. She has type 2 diabetes mellitus. Pertinent negatives for hypoglycemia include no confusion or dizziness. Pertinent negatives for diabetes include no chest pain, no fatigue, no polydipsia, no polyphagia and no weakness. Risk factors for coronary artery disease include diabetes mellitus, hypertension and post-menopausal. Current diabetic treatment includes insulin injections. She is compliant with treatment all of the time. Her weight is stable. She is following a diabetic diet. She has not had a previous visit with a dietitian. She does not see a podiatrist.Eye exam is not current.   Patient recently had hysterectomy she states her sugars were very high while in the hospital.  This raises in the question the accuracy of today's reading.  We will repeat A1c  Patient for blood pressure check up.  The patient does have hypertension.  The patient is on medication.  Patient relates compliance with meds. Todays BP reviewed with the patient. Patient denies issues with medication. Patient relates reasonable diet. Patient tries to minimize salt. Patient aware of BP goals.  Patient here for follow-up regarding cholesterol.  The patient does have hyperlipidemia.  Patient does try to maintain a reasonable diet.  Patient does take the medication on a regular basis.  Denies missing a dose.  The patient denies any obvious side effects.  Prior blood work results reviewed with the patient.  The patient is aware of his cholesterol goals and the need to keep it under good control to lessen the risk of disease.  Patient is disabled does not work Is at home Results for orders placed or performed in visit on 09/17/17  POCT glycosylated hemoglobin (Hb A1C)  Result Value Ref Range   Hemoglobin A1C 6.1 (A) 4.0 - 5.6 %   HbA1c POC (<> result, manual entry)     HbA1c,  POC (prediabetic range)     HbA1c, POC (controlled diabetic range)      Her specialist is managing her emotions and sleep/moods Review of Systems  Constitutional: Negative for activity change, appetite change and fatigue.  HENT: Negative for congestion and rhinorrhea.   Respiratory: Negative for cough and shortness of breath.   Cardiovascular: Negative for chest pain and leg swelling.  Gastrointestinal: Negative for abdominal pain and diarrhea.  Endocrine: Negative for polydipsia and polyphagia.  Skin: Negative for color change.  Neurological: Negative for dizziness and weakness.  Psychiatric/Behavioral: Negative for behavioral problems and confusion.       Objective:   Physical Exam  Constitutional: She appears well-nourished. No distress.  HENT:  Head: Normocephalic and atraumatic.  Eyes: Right eye exhibits no discharge. Left eye exhibits no discharge.  Neck: No tracheal deviation present.  Cardiovascular: Normal rate, regular rhythm and normal heart sounds.  No murmur heard. Pulmonary/Chest: Effort normal and breath sounds normal. No respiratory distress.  Musculoskeletal: She exhibits no edema.  Lymphadenopathy:    She has no cervical adenopathy.  Neurological: She is alert. Coordination normal.  Skin: Skin is warm and dry.  Psychiatric: She has a normal mood and affect. Her behavior is normal.  Vitals reviewed.         Assessment & Plan:  Diabetes fair control check A1c through the lab for more accurate readings patient to write readings down on a regular basis and send those to [place]  HTN- Patient was seen today as part of a visit  regarding hypertension. The importance of healthy diet and regular physical activity was discussed. The importance of compliance with medications discussed.  Ideal goal is to keep blood pressure low elevated levels certainly below 809/98 when possible.  The patient was counseled that keeping blood pressure under control lessen his risk of  complications.  The importance of regular follow-ups was discussed with the patient.  Low-salt diet such as DASH recommended.  Regular physical activity was recommended as well.  Patient was advised to keep regular follow-ups.  The patient was seen today as part of an evaluation regarding hyperlipidemia.  Recent lab work has been reviewed with the patient as well as the goals for good cholesterol care.  In addition to this medications have been discussed the importance of compliance with diet and medications discussed as well.  Finally the patient is aware that poor control of cholesterol, noncompliance can dramatically increase the risk of complications. The patient will keep regular office visits and the patient does agreed to periodic lab work. Check cholesterol profile await results  Patient to follow-up 6 months sooner problems

## 2017-09-18 ENCOUNTER — Other Ambulatory Visit: Payer: Self-pay | Admitting: Family Medicine

## 2017-11-11 ENCOUNTER — Other Ambulatory Visit: Payer: Self-pay | Admitting: Family Medicine

## 2017-11-30 DIAGNOSIS — F339 Major depressive disorder, recurrent, unspecified: Secondary | ICD-10-CM | POA: Diagnosis not present

## 2018-01-11 ENCOUNTER — Other Ambulatory Visit: Payer: Self-pay | Admitting: Family Medicine

## 2018-02-07 ENCOUNTER — Other Ambulatory Visit: Payer: Self-pay | Admitting: Family Medicine

## 2018-02-22 DIAGNOSIS — F339 Major depressive disorder, recurrent, unspecified: Secondary | ICD-10-CM | POA: Diagnosis not present

## 2018-03-19 DIAGNOSIS — F339 Major depressive disorder, recurrent, unspecified: Secondary | ICD-10-CM | POA: Diagnosis not present

## 2018-03-21 ENCOUNTER — Ambulatory Visit: Payer: Medicare Other | Admitting: Family Medicine

## 2018-03-22 ENCOUNTER — Ambulatory Visit (INDEPENDENT_AMBULATORY_CARE_PROVIDER_SITE_OTHER): Payer: Medicare Other | Admitting: Family Medicine

## 2018-03-22 ENCOUNTER — Encounter: Payer: Self-pay | Admitting: Family Medicine

## 2018-03-22 VITALS — BP 126/74 | Ht 63.0 in | Wt 200.0 lb

## 2018-03-22 DIAGNOSIS — I10 Essential (primary) hypertension: Secondary | ICD-10-CM

## 2018-03-22 DIAGNOSIS — E7849 Other hyperlipidemia: Secondary | ICD-10-CM | POA: Diagnosis not present

## 2018-03-22 DIAGNOSIS — Z79899 Other long term (current) drug therapy: Secondary | ICD-10-CM

## 2018-03-22 DIAGNOSIS — F321 Major depressive disorder, single episode, moderate: Secondary | ICD-10-CM | POA: Diagnosis not present

## 2018-03-22 DIAGNOSIS — E119 Type 2 diabetes mellitus without complications: Secondary | ICD-10-CM

## 2018-03-22 DIAGNOSIS — R1013 Epigastric pain: Secondary | ICD-10-CM | POA: Diagnosis not present

## 2018-03-22 DIAGNOSIS — R6881 Early satiety: Secondary | ICD-10-CM | POA: Diagnosis not present

## 2018-03-22 MED ORDER — METFORMIN HCL 500 MG PO TABS: ORAL_TABLET | ORAL | 1 refills | Status: DC

## 2018-03-22 MED ORDER — ATORVASTATIN CALCIUM 10 MG PO TABS: 10.0000 mg | ORAL_TABLET | Freq: Every day | ORAL | 1 refills | Status: DC

## 2018-03-22 MED ORDER — INSULIN GLARGINE 100 UNIT/ML SOLOSTAR PEN: PEN_INJECTOR | SUBCUTANEOUS | 5 refills | Status: DC

## 2018-03-22 MED ORDER — LOSARTAN POTASSIUM 100 MG PO TABS: ORAL_TABLET | ORAL | 1 refills | Status: DC

## 2018-03-22 NOTE — Progress Notes (Signed)
Subjective:    Patient ID: Carrie Cook, female    DOB: 07/27/1960, 58 y.o.   MRN: [id number]  Diabetes  She presents for her follow-up diabetic visit. She has type 2 diabetes mellitus. Pertinent negatives for hypoglycemia include no confusion or dizziness. Pertinent negatives for diabetes include no chest pain, no fatigue, no polydipsia, no polyphagia and no weakness. She is compliant with treatment all of the time. Home blood sugar record trend: does not check blood sugar. She does not see a podiatrist.Eye exam is not current (planning on going in the next couple months).   Results for orders placed or performed in visit on 09/17/17  POCT glycosylated hemoglobin (Hb A1C)  Result Value Ref Range   Hemoglobin A1C 6.1 (A) 4.0 - 5.6 %   HbA1c POC (<> result, manual entry)     HbA1c, POC (prediabetic range)     HbA1c, POC (controlled diabetic range)     Patient relates have food when she eats it feels like it causes her to feel bloated and discomfort in the epigastric region she has had a gastric bypass surgery she also states that time she feels like the food is just sitting in her stomach she also has diabetes so there could be some element of gastroparesis  .bvlip  Patient for blood pressure check up.  The patient does have hypertension.  The patient is on medication.  Patient relates compliance with meds. Todays BP reviewed with the patient. Patient denies issues with medication. Patient relates reasonable diet. Patient tries to minimize salt. Patient aware of BP goals.   Review of Systems  Constitutional: Negative for activity change, appetite change and fatigue.  HENT: Negative for congestion and rhinorrhea.   Respiratory: Negative for cough and shortness of breath.   Cardiovascular: Negative for chest pain and leg swelling.  Gastrointestinal: Negative for abdominal pain and diarrhea.  Endocrine: Negative for polydipsia and polyphagia.  Skin: Negative for color change.    Neurological: Negative for dizziness and weakness.  Psychiatric/Behavioral: Negative for behavioral problems and confusion.       Objective:   Physical Exam Vitals signs reviewed.  Constitutional:      General: She is not in acute distress. HENT:     Head: Normocephalic and atraumatic.  Eyes:     General:        Right eye: No discharge.        Left eye: No discharge.  Neck:     Trachea: No tracheal deviation.  Cardiovascular:     Rate and Rhythm: Normal rate and regular rhythm.     Heart sounds: Normal heart sounds. No murmur.  Pulmonary:     Effort: Pulmonary effort is normal. No respiratory distress.     Breath sounds: Normal breath sounds.  Lymphadenopathy:     Cervical: No cervical adenopathy.  Skin:    General: Skin is warm and dry.  Neurological:     Mental Status: She is alert.     Coordination: Coordination normal.  Psychiatric:        Behavior: Behavior normal.           Assessment & Plan:  Diabetes overall good control watch diet work hard at trying to lose weight  Depression followed by psychiatry with [facility] they have her on Abilify as well as an antidepressant watch diet closely avoid weight gain if possible  Early satiety with epigastric pain although not severe she describes feeling full when she eats no true pain she has  had a gastric bypass we will look into whether or not having a barium swallow versus gastric emptying study would be the best plan of action we will touch base with gastroenterology regarding this eventually she may end up having to have a EGD  Blood pressure good control continue current measures  Hyperlipidemia previous labs reviewed new labs ordered continue current measures  25 minutes was spent with the patient.  This statement verifies that 25 minutes was indeed spent with the patient.  More than 50% of this visit-total duration of the visit-was spent in counseling and coordination of care. The issues that the patient came  in for today as reflected in the diagnosis (s) please refer to documentation for further details.  Continue care with psychiatry for her depression

## 2018-03-30 ENCOUNTER — Telehealth: Payer: Self-pay | Admitting: Family Medicine

## 2018-03-30 DIAGNOSIS — R6881 Early satiety: Secondary | ICD-10-CM

## 2018-03-30 DIAGNOSIS — R1013 Epigastric pain: Secondary | ICD-10-CM

## 2018-03-30 NOTE — Telephone Encounter (Signed)
Patient with mild early satiety, occasional epigastric discomfort, history of gastric bypass  We will connect with gastroenterology to see if they recommend gastric emptying study, barium swallow/upper GI, or EGD

## 2018-04-01 LAB — POCT GLYCOSYLATED HEMOGLOBIN (HGB A1C): Hemoglobin A1C: 6.1 % — AB (ref 4.0–5.6)

## 2018-04-01 NOTE — Progress Notes (Signed)
Hemoglobin A1C has been resulted.

## 2018-05-09 DIAGNOSIS — F339 Major depressive disorder, recurrent, unspecified: Secondary | ICD-10-CM | POA: Diagnosis not present

## 2018-05-11 NOTE — Telephone Encounter (Signed)
Patient was having early satiety along with nausea and abdominal discomfort  I told the patient that I would discuss this with gastroenterology  I did discuss this with Reginia Forts stated that the patient may end up needing to have a EGD at some point but they do not recommend it currently because of coronavirus outbreak   please let patient know that I did discuss this with gastroenterology.  At this present moment they do not recommend any major testing because her coronavirus They did state that they can do a virtual visit with her regarding her abdominal symptoms or they can see her in the office when coronavirus settles down Patient does have a upcoming follow-up appointment we can discuss it further at that time if she wishes or we can put in referral as well at this time

## 2018-05-13 NOTE — Telephone Encounter (Signed)
Left message to return call 

## 2018-05-14 ENCOUNTER — Encounter: Payer: Self-pay | Admitting: Family Medicine

## 2018-05-14 NOTE — Addendum Note (Signed)
Addended by: Dairl Ponder on: 05/14/2018 10:41 AM   Modules accepted: Orders

## 2018-05-14 NOTE — Telephone Encounter (Signed)
Patient advised per Dr Nicki Reaper:  Dr Nicki Reaper discussed this with Reginia Forts stated that the patient may end up needing to have a EGD at some point but they do not recommend it currently because of coronavirus outbreak  At this present moment they do not recommend any major testing because her coronavirus They did state that they can do a virtual visit with her regarding her abdominal symptoms or they can see her in the office when coronavirus settles down Patient does have a upcoming follow-up appointment we can discuss it further at that time if she wishes or we can put in referral as well at this time Patient verbalized understanding and would like to proceed with referral to GI office Referral ordered in [facility]

## 2018-05-27 ENCOUNTER — Other Ambulatory Visit: Payer: Self-pay

## 2018-05-27 ENCOUNTER — Encounter: Payer: Self-pay | Admitting: Gastroenterology

## 2018-05-27 ENCOUNTER — Telehealth: Payer: Self-pay

## 2018-05-27 ENCOUNTER — Ambulatory Visit (INDEPENDENT_AMBULATORY_CARE_PROVIDER_SITE_OTHER): Payer: Medicare Other | Admitting: Gastroenterology

## 2018-05-27 DIAGNOSIS — R1013 Epigastric pain: Secondary | ICD-10-CM

## 2018-05-27 DIAGNOSIS — R1310 Dysphagia, unspecified: Secondary | ICD-10-CM | POA: Diagnosis not present

## 2018-05-27 DIAGNOSIS — G8929 Other chronic pain: Secondary | ICD-10-CM | POA: Insufficient documentation

## 2018-05-27 NOTE — Patient Instructions (Signed)
We have arranged an upper endoscopy and dilatation in the near future with Dr. Gala Romney.  I have provided samples of Linzess to take once each morning on an empty stomach, 30 minutes before breakfast.   We will see you back in 3 months! We may need to do a CT scan if upper endoscopy is not revealing.  It was a pleasure to see you today. I strive to create trusting relationships with patients to provide genuine, compassionate, and quality care. I value your feedback. If you receive a survey regarding your visit,  I greatly appreciate you taking time to fill this out.   Annitta Needs, PhD, ANP-BC [facility]

## 2018-05-27 NOTE — H&P (View-Only) (Signed)
Referring Provider: Dr. Sallee Lange Primary Care Physician:  Kathyrn Drown, MD  Primary GI: Dr. Gala Romney  Virtual Visit via Telephone Note Due to COVID-19, visit is conducted virtually and was requested by patient.   I connected with Idamae Schuller on 05/27/18 at  2:00 PM EDT by telephone and verified that I am speaking with the correct person using two identifiers.   I discussed the limitations, risks, security and privacy concerns of performing an evaluation and management service by telephone and the availability of in person appointments. I also discussed with the patient that there may be a patient responsible charge related to this service. The patient expressed understanding and agreed to proceed.  Chief Complaint  Patient presents with  . Abdominal Pain    hx gastric bypass; occurs occ when starts eating  . Emesis  . Constipation    occ; sometimes loose     History of Present Illness: 58 year old female with history of IDA in 2014, recommending EGD/colonoscopy. Last seen by [place] in 2014. EGD with gastric polyps, otherwise negative. TCS with mild colonic diverticulosis, otherwise normal. Capsule study with very tiny non-bleeding erosion in proximal small bowel. No GI source for IDA. Referred by Dr. Wolfgang Phoenix due to abdominal pain   At least once per day food doesn't want to "go through". Gets hard in upper abdomen. Starts sweating. Sometimes if laying on left side, will help ease off the pain. Sometimes has to vomit. Hit or miss. Present since surgery. Gastric bypass October 2014 by [facility]. Dr. Excell Seltzer. Started gaining weight over the past 4-5 years. Starting weight pre-op 230-240. Lowest weight 173. Over time, started to regain weight loss. Now between 203-207.  Taking anti-depressants and wonders if this contributed to weight gain. 2014 UGI completed shortly after surgery, with normal appearance s/p gastric bypass, mild dysmotility.  Has always had trouble  with eggs since surgery, causing pain. Tries to chew up meat really well. Sometimes swallows quickly. Eats a lot of trail mix.    Constipation: significant constipation that started post-op gastric bypass. Now does not have any firm BMs. Mushy, sometimes diarrhea. May go 3-4 times one day after going a few days without a BM. Unproductive stools. Feels like not emptying out.   Has not seen Dr. Excell Seltzer recently. Followed for a year post-op. With weight gain stopped going.   Needs knee replacement but has had to put on hold.   Will take Tylenol and alternate with Ibuprofen for body aches. Taking Ibuprofen more often, taking 3-4 once a day.   Past Medical History:  Diagnosis Date  . Anemia    PT HAS HAD COLONOSCOPY AND ENDOSCOPY WORK UP - NO PROBLEMS FOUND AS SOURCE OF ANEMIA -- PT HAD IRON INFUSION AT [facility] 11/13/12-AFTER SEEING HEMATOLOGIST DR. Ramsey AND HE GAVE HEMATOLOGIC CLEARANCE FOR GASTRIC BYPASS SURGERY.  Marland Kitchen Anxiety   . Asthma    daily and prn inhalers  . Degenerative joint disease    right knee, spine - STATES INTERMITTENT  NUMBNESS DOWN RT LEG WITH PROLONGED STANDING OR WALKING- THINKS RELATED TO HER SPINE PROBLEMS  . Dental crowns present   . Depression   . Family history of anesthesia complication    pt's mother and sister have hx. of post-op N/V  . Fibroids    UTERINE  . Gastroesophageal reflux disease    none for 2 years since Bariatric surgery.  . H/O hiatal hernia   . History of endometriosis   . Hyperlipidemia  no current med.  . Hypertension    under control with med., has been on med. x 2 yr.  . Insulin dependent diabetes mellitus ([place])   . Lock jaw    jaw locks open if opens mouth wide  . Migraines   . Neuropathy    FEET  . Obesity   . Palpitations   . PONV (postoperative nausea and vomiting)   . Shortness of breath    with daily activities  . Sleep apnea    hasnt had to use CPAP in 2 years since she lost weight     Past Surgical History:   Procedure Laterality Date  . ABDOMINAL HYSTERECTOMY N/A 06/06/2017   Procedure: HYSTERECTOMY ABDOMINAL;  Surgeon: Florian Buff, MD;  [facility];  Service: Gynecology;  Laterality: N/A;  . BREATH TEK H PYLORI N/A 08/13/2012   Procedure: BREATH TEK H PYLORI;  Surgeon: Edward Jolly, MD;  Location: [facility];  Service: General;  Laterality: N/A;  . CARPAL TUNNEL RELEASE  01/03/2012   Procedure: CARPAL TUNNEL RELEASE;  Surgeon: Wynonia Sours, MD;  Location: [place];  Service: Orthopedics;  Laterality: Right;  . carpal tunnel release right hand Right 12/13  . COLONOSCOPY  05/2009   LNL:[id number] hemorrhoids otherwise normal colon, rectum and terminal ileum  . COLONOSCOPY WITH ESOPHAGOGASTRODUODENOSCOPY (EGD) N/A 10/28/2012   Procedure: COLONOSCOPY WITH ESOPHAGOGASTRODUODENOSCOPY (EGD);  Surgeon: Daneil Dolin, MD;  [facility];  Service: Endoscopy;  Laterality: N/A;  7:30  . DILITATION & CURRETTAGE/HYSTROSCOPY WITH NOVASURE ABLATION N/A 07/22/2014   Procedure: DILATATION & CURETTAGE/HYSTEROSCOPY WITH NOVASURE ABLATION; uterine length 6.0 cm; uterine width 3.8 cm; total ablation time 1 minute 15 seconds;  Surgeon: Florian Buff, MD;  [facility];  Service: Gynecology;  Laterality: N/A;  . ESOPHAGOGASTRODUODENOSCOPY  5/013/2011   DEY:CXKGYJ esophagus/multiple polyps removed s/p (hyperplastic). Gastritis without H.pylori.  Marland Kitchen GASTRIC ROUX-EN-Y N/A 11/19/2012   Procedure: LAPAROSCOPIC ROUX-EN-Y GASTRIC;  Surgeon: Edward Jolly, MD;  Location: [facility];  Service: General;  Laterality: N/A;  . GIVENS CAPSULE STUDY N/A 10/28/2012   Procedure: GIVENS CAPSULE STUDY;  Surgeon: Daneil Dolin, MD;  [facility];  Service: Endoscopy;  Laterality: N/A;  . KNEE ARTHROSCOPY WITH MEDIAL MENISECTOMY Right 10/28/2015   Procedure: RIGHT KNEE ARTHROSCOPY WITH MEDIAL MENISECTOMY;  Surgeon: Carole Civil, MD;  [facility];  Service: Orthopedics;   Laterality: Right;  . LAPAROSCOPY     due to endometriosis  . PELVIC LAPAROSCOPY  11/04/1999   with fulguration of endometriosis  . SALPINGOOPHORECTOMY Bilateral 06/06/2017   Procedure: SALPINGO OOPHORECTOMY;  Surgeon: Florian Buff, MD;  [facility];  Service: Gynecology;  Laterality: Bilateral;  . TRIGGER FINGER RELEASE Right 09/24/2012   Procedure: RELEASE A-1 PULLEY OF RIGHT THUMB;  Surgeon: Wynonia Sours, MD;  Location: [place];  Service: Orthopedics;  Laterality: Right;  . TUBAL LIGATION  1993     Current Meds  Medication Sig  . albuterol (PROVENTIL HFA;VENTOLIN HFA) 108 (90 Base) MCG/ACT inhaler Inhale 2 puffs into the lungs every 6 (six) hours as needed for wheezing.  . cetirizine (ZYRTEC) 10 MG tablet Take 10 mg by mouth daily.  . Cyanocobalamin (B-12) 2500 MCG SUBL Place under the tongue. One every morning  . estradiol (ESTRACE) 2 MG tablet TAKE 1 TABLET BY MOUTH DAILY AT NIGHT.  Marland Kitchen fluticasone (FLOVENT HFA) 220 MCG/ACT inhaler Inhale 2 puffs into the lungs 2 (two) times daily.  Marland Kitchen  Insulin Glargine (LANTUS SOLOSTAR) 100 UNIT/ML Solostar Pen Use 24 units qhs. May titrate to 30 units qhs  . Insulin Pen Needle (PEN NEEDLES) 32G X 4 MM MISC 32 g by Does not apply route daily.  Marland Kitchen lamoTRIgine (LAMICTAL) 100 MG tablet Take 150 mg by mouth daily.   Marland Kitchen losartan (COZAAR) 100 MG tablet TAKE 1 AND 1/2 TABLETS(150 MG) BY MOUTH DAILY  . Melatonin 10 MG SUBL Place under the tongue. One in the evening  . metFORMIN (GLUCOPHAGE) 500 MG tablet TAKE 1 TABLET(500 MG) BY MOUTH TWICE DAILY WITH A MEAL  . Multiple Vitamin (MULTIVITAMIN WITH MINERALS) TABS tablet Take 1 tablet by mouth 2 (two) times daily.   Marland Kitchen venlafaxine (EFFEXOR) 75 MG tablet Take 75 mg by mouth 2 (two) times daily with a meal. TAKE 1 TABLET (75 MG) IN THE MORNING, AND 2 TABLETS (150 MG) BY MOUTH IN THE EVENING.      Review of Systems: Gen: see HPI CV: Denies chest pain, palpitations, syncope, peripheral edema,  and claudication. Resp: Denies dyspnea at rest, cough, wheezing, coughing up blood, and pleurisy. GI: see HPI Derm: Denies rash, itching, dry skin Psych: Denies depression, anxiety, memory loss, confusion. No homicidal or suicidal ideation.  Heme: Denies bruising, bleeding, and enlarged lymph nodes.  Observations/Objective: No distress via video call. Cheerful. . Weight 203-207  Ht 5'3"  Assessment and Plan: Pleasant 58 year old female with history of gastric bypass in 2014 by [facility], presenting with chronic epigastric pain, dysphagia, daily episodes of upper abdominal bloating, intermittent N/V. Present since surgery. UGI in 2014 after gastric bypass with normal surgical changes and mild esophageal dysmotility. Last EGD prior to gastric bypass in 2014 with gastric polyps. Endorses use of NSAIDs.  Discussed diagnostic EGD in near future with possible dilatation. Would be concerned about anastomotic ulcer in setting of NSAIDs, possible anastomotic stricture. I doubt we are dealing with an internal hernia, as she is actually gaining weight; however, would recommend CT scan if EGD is negative.   Constipation: worsening since surgery. Linzess 145 mcg samples provied to patient.   Proceed with upper endoscopy/dilatation in the near future with Dr. Gala Romney. The risks, benefits, and alternatives have been discussed in detail with patient. They have stated understanding and desire to proceed.  CT if EGD unrevealing 3 months   Follow Up Instructions:    I discussed the assessment and treatment plan with the patient. The patient was provided an opportunity to ask questions and all were answered. The patient agreed with the plan and demonstrated an understanding of the instructions.   The patient was advised to call back or seek an in-person evaluation if the symptoms worsen or if the condition fails to improve as anticipated.  I provided 35 minutes of face-to-face time during this video call encounter.    Annitta Needs, PhD, ANP-BC [facility]

## 2018-05-27 NOTE — Progress Notes (Signed)
Referring Provider: Dr. Sallee Lange Primary Care Physician:  Kathyrn Drown, MD  Primary GI: Dr. Gala Romney  Virtual Visit via Telephone Note Due to COVID-19, visit is conducted virtually and was requested by patient.   I connected with Carrie Cook on 05/27/18 at  2:00 PM EDT by telephone and verified that I am speaking with the correct person using two identifiers.   I discussed the limitations, risks, security and privacy concerns of performing an evaluation and management service by telephone and the availability of in person appointments. I also discussed with the patient that there may be a patient responsible charge related to this service. The patient expressed understanding and agreed to proceed.  Chief Complaint  Patient presents with  . Abdominal Pain    hx gastric bypass; occurs occ when starts eating  . Emesis  . Constipation    occ; sometimes loose     History of Present Illness: 58 year old female with history of IDA in 2014, recommending EGD/colonoscopy. Last seen by [place] in 2014. EGD with gastric polyps, otherwise negative. TCS with mild colonic diverticulosis, otherwise normal. Capsule study with very tiny non-bleeding erosion in proximal small bowel. No GI source for IDA. Referred by Dr. Wolfgang Phoenix due to abdominal pain   At least once per day food doesn't want to "go through". Gets hard in upper abdomen. Starts sweating. Sometimes if laying on left side, will help ease off the pain. Sometimes has to vomit. Hit or miss. Present since surgery. Gastric bypass October 2014 by [facility]. Dr. Excell Seltzer. Started gaining weight over the past 4-5 years. Starting weight pre-op 230-240. Lowest weight 173. Over time, started to regain weight loss. Now between 203-207.  Taking anti-depressants and wonders if this contributed to weight gain. 2014 UGI completed shortly after surgery, with normal appearance s/p gastric bypass, mild dysmotility.  Has always had trouble  with eggs since surgery, causing pain. Tries to chew up meat really well. Sometimes swallows quickly. Eats a lot of trail mix.    Constipation: significant constipation that started post-op gastric bypass. Now does not have any firm BMs. Mushy, sometimes diarrhea. May go 3-4 times one day after going a few days without a BM. Unproductive stools. Feels like not emptying out.   Has not seen Dr. Excell Seltzer recently. Followed for a year post-op. With weight gain stopped going.   Needs knee replacement but has had to put on hold.   Will take Tylenol and alternate with Ibuprofen for body aches. Taking Ibuprofen more often, taking 3-4 once a day.   Past Medical History:  Diagnosis Date  . Anemia    PT HAS HAD COLONOSCOPY AND ENDOSCOPY WORK UP - NO PROBLEMS FOUND AS SOURCE OF ANEMIA -- PT HAD IRON INFUSION AT [facility] 11/13/12-AFTER SEEING HEMATOLOGIST DR. Mulberry AND HE GAVE HEMATOLOGIC CLEARANCE FOR GASTRIC BYPASS SURGERY.  Marland Kitchen Anxiety   . Asthma    daily and prn inhalers  . Degenerative joint disease    right knee, spine - STATES INTERMITTENT  NUMBNESS DOWN RT LEG WITH PROLONGED STANDING OR WALKING- THINKS RELATED TO HER SPINE PROBLEMS  . Dental crowns present   . Depression   . Family history of anesthesia complication    pt's mother and sister have hx. of post-op N/V  . Fibroids    UTERINE  . Gastroesophageal reflux disease    none for 2 years since Bariatric surgery.  . H/O hiatal hernia   . History of endometriosis   . Hyperlipidemia  no current med.  . Hypertension    under control with med., has been on med. x 2 yr.  . Insulin dependent diabetes mellitus ([place])   . Lock jaw    jaw locks open if opens mouth wide  . Migraines   . Neuropathy    FEET  . Obesity   . Palpitations   . PONV (postoperative nausea and vomiting)   . Shortness of breath    with daily activities  . Sleep apnea    hasnt had to use CPAP in 2 years since she lost weight     Past Surgical History:   Procedure Laterality Date  . ABDOMINAL HYSTERECTOMY N/A 06/06/2017   Procedure: HYSTERECTOMY ABDOMINAL;  Surgeon: Florian Buff, MD;  [facility]: [facility];  Service: Gynecology;  Laterality: N/A;  . BREATH TEK H PYLORI N/A 08/13/2012   Procedure: BREATH TEK H PYLORI;  Surgeon: Edward Jolly, MD;  Location: [facility];  Service: General;  Laterality: N/A;  . CARPAL TUNNEL RELEASE  01/03/2012   Procedure: CARPAL TUNNEL RELEASE;  Surgeon: Wynonia Sours, MD;  Location: [place];  Service: Orthopedics;  Laterality: Right;  . carpal tunnel release right hand Right 12/13  . COLONOSCOPY  05/2009   ITG:[id number] hemorrhoids otherwise normal colon, rectum and terminal ileum  . COLONOSCOPY WITH ESOPHAGOGASTRODUODENOSCOPY (EGD) N/A 10/28/2012   Procedure: COLONOSCOPY WITH ESOPHAGOGASTRODUODENOSCOPY (EGD);  Surgeon: Daneil Dolin, MD;  [facility];  Service: Endoscopy;  Laterality: N/A;  7:30  . DILITATION & CURRETTAGE/HYSTROSCOPY WITH NOVASURE ABLATION N/A 07/22/2014   Procedure: DILATATION & CURETTAGE/HYSTEROSCOPY WITH NOVASURE ABLATION; uterine length 6.0 cm; uterine width 3.8 cm; total ablation time 1 minute 15 seconds;  Surgeon: Florian Buff, MD;  [facility];  Service: Gynecology;  Laterality: N/A;  . ESOPHAGOGASTRODUODENOSCOPY  5/013/2011   RAX:ENMMHW esophagus/multiple polyps removed s/p (hyperplastic). Gastritis without H.pylori.  Marland Kitchen GASTRIC ROUX-EN-Y N/A 11/19/2012   Procedure: LAPAROSCOPIC ROUX-EN-Y GASTRIC;  Surgeon: Edward Jolly, MD;  [facility];  Service: General;  Laterality: N/A;  . GIVENS CAPSULE STUDY N/A 10/28/2012   Procedure: GIVENS CAPSULE STUDY;  Surgeon: Daneil Dolin, MD;  [facility];  Service: Endoscopy;  Laterality: N/A;  . KNEE ARTHROSCOPY WITH MEDIAL MENISECTOMY Right 10/28/2015   Procedure: RIGHT KNEE ARTHROSCOPY WITH MEDIAL MENISECTOMY;  Surgeon: Carole Civil, MD;  [facility];  Service: Orthopedics;   Laterality: Right;  . LAPAROSCOPY     due to endometriosis  . PELVIC LAPAROSCOPY  11/04/1999   with fulguration of endometriosis  . SALPINGOOPHORECTOMY Bilateral 06/06/2017   Procedure: SALPINGO OOPHORECTOMY;  Surgeon: Florian Buff, MD;  [facility];  Service: Gynecology;  Laterality: Bilateral;  . TRIGGER FINGER RELEASE Right 09/24/2012   Procedure: RELEASE A-1 PULLEY OF RIGHT THUMB;  Surgeon: Wynonia Sours, MD;  Location: [place];  Service: Orthopedics;  Laterality: Right;  . TUBAL LIGATION  1993     Current Meds  Medication Sig  . albuterol (PROVENTIL HFA;VENTOLIN HFA) 108 (90 Base) MCG/ACT inhaler Inhale 2 puffs into the lungs every 6 (six) hours as needed for wheezing.  . cetirizine (ZYRTEC) 10 MG tablet Take 10 mg by mouth daily.  . Cyanocobalamin (B-12) 2500 MCG SUBL Place under the tongue. One every morning  . estradiol (ESTRACE) 2 MG tablet TAKE 1 TABLET BY MOUTH DAILY AT NIGHT.  Marland Kitchen fluticasone (FLOVENT HFA) 220 MCG/ACT inhaler Inhale 2 puffs into the lungs 2 (two) times daily.  Marland Kitchen  Insulin Glargine (LANTUS SOLOSTAR) 100 UNIT/ML Solostar Pen Use 24 units qhs. May titrate to 30 units qhs  . Insulin Pen Needle (PEN NEEDLES) 32G X 4 MM MISC 32 g by Does not apply route daily.  Marland Kitchen lamoTRIgine (LAMICTAL) 100 MG tablet Take 150 mg by mouth daily.   Marland Kitchen losartan (COZAAR) 100 MG tablet TAKE 1 AND 1/2 TABLETS(150 MG) BY MOUTH DAILY  . Melatonin 10 MG SUBL Place under the tongue. One in the evening  . metFORMIN (GLUCOPHAGE) 500 MG tablet TAKE 1 TABLET(500 MG) BY MOUTH TWICE DAILY WITH A MEAL  . Multiple Vitamin (MULTIVITAMIN WITH MINERALS) TABS tablet Take 1 tablet by mouth 2 (two) times daily.   Marland Kitchen venlafaxine (EFFEXOR) 75 MG tablet Take 75 mg by mouth 2 (two) times daily with a meal. TAKE 1 TABLET (75 MG) IN THE MORNING, AND 2 TABLETS (150 MG) BY MOUTH IN THE EVENING.      Review of Systems: Gen: see HPI CV: Denies chest pain, palpitations, syncope, peripheral edema,  and claudication. Resp: Denies dyspnea at rest, cough, wheezing, coughing up blood, and pleurisy. GI: see HPI Derm: Denies rash, itching, dry skin Psych: Denies depression, anxiety, memory loss, confusion. No homicidal or suicidal ideation.  Heme: Denies bruising, bleeding, and enlarged lymph nodes.  Observations/Objective: No distress via video call. Cheerful. . Weight 203-207  Ht 5'3"  Assessment and Plan: Pleasant 58 year old female with history of gastric bypass in 2014 by [facility], presenting with chronic epigastric pain, dysphagia, daily episodes of upper abdominal bloating, intermittent N/V. Present since surgery. UGI in 2014 after gastric bypass with normal surgical changes and mild esophageal dysmotility. Last EGD prior to gastric bypass in 2014 with gastric polyps. Endorses use of NSAIDs.  Discussed diagnostic EGD in near future with possible dilatation. Would be concerned about anastomotic ulcer in setting of NSAIDs, possible anastomotic stricture. I doubt we are dealing with an internal hernia, as she is actually gaining weight; however, would recommend CT scan if EGD is negative.   Constipation: worsening since surgery. Linzess 145 mcg samples provied to patient.   Proceed with upper endoscopy/dilatation in the near future with Dr. Gala Romney. The risks, benefits, and alternatives have been discussed in detail with patient. They have stated understanding and desire to proceed.  CT if EGD unrevealing 3 months   Follow Up Instructions:    I discussed the assessment and treatment plan with the patient. The patient was provided an opportunity to ask questions and all were answered. The patient agreed with the plan and demonstrated an understanding of the instructions.   The patient was advised to call back or seek an in-person evaluation if the symptoms worsen or if the condition fails to improve as anticipated.  I provided 35 minutes of face-to-face time during this video call encounter.    Annitta Needs, PhD, ANP-BC [facility]

## 2018-05-27 NOTE — Telephone Encounter (Signed)
Called pt, EGD/-/+DIL w/Propofol w/RMR scheduled for 06/06/18 at 8:00am. Instructions given verbally and will mail after pre-op appt is scheduled. Pt is also active on MyChart. Orders entered.

## 2018-05-28 ENCOUNTER — Encounter: Payer: Self-pay | Admitting: Internal Medicine

## 2018-05-28 NOTE — Telephone Encounter (Signed)
Called and informed pt of pre-op appt 06/03/18 at 9:00am. Appt letter mailed with procedure instructions.

## 2018-05-29 NOTE — Patient Instructions (Signed)
Carrie Cook  05/29/2018     @PREFPERIOPPHARMACY @   Your procedure is scheduled on  06/06/2018.  Report to [facility] at  645   A.M.  Call this number if you have problems the morning of surgery:  [phone]   Remember:  Follow the diet instructions given to you by Dr Roseanne Kaufman office.                   Take these medicines the morning of surgery with A SIP OF WATER  Zyrtec, effexor. Use your inhaler before you come. Only take 1/2 of your usual insulin the night before your procedure.    Do not wear jewelry, make-up or nail polish.  Do not wear lotions, powders, or perfumes, or deodorant.  Do not shave 48 hours prior to surgery.  Men may shave face and neck.  Do not bring valuables to the hospital.  [facility] is not responsible for any belongings or valuables.  Contacts, dentures or bridgework may not be worn into surgery.  Leave your suitcase in the car.  After surgery it may be brought to your room.  For patients admitted to the hospital, discharge time will be determined by your treatment team.  Patients discharged the day of surgery will not be allowed to drive home.   Name and phone number of your driver:   family Special instructions:  None  Please read over the following fact sheets that you were given. Anesthesia Post-op Instructions and Care and Recovery After Surgery       Upper Endoscopy, Adult, Care After This sheet gives you information about how to care for yourself after your procedure. Your health care provider may also give you more specific instructions. If you have problems or questions, contact your health care provider. What can I expect after the procedure? After the procedure, it is common to have:  A sore throat.  Mild stomach pain or discomfort.  Bloating.  Nausea. Follow these instructions at home:   Follow instructions from your health care provider about what to eat or drink after your procedure.  Return to your  normal activities as told by your health care provider. Ask your health care provider what activities are safe for you.  Take over-the-counter and prescription medicines only as told by your health care provider.  Do not drive for 24 hours if you were given a sedative during your procedure.  Keep all follow-up visits as told by your health care provider. This is important. Contact a health care provider if you have:  A sore throat that lasts longer than one day.  Trouble swallowing. Get help right away if:  You vomit blood or your vomit looks like coffee grounds.  You have: ? A fever. ? Bloody, black, or tarry stools. ? A severe sore throat or you cannot swallow. ? Difficulty breathing. ? Severe pain in your chest or abdomen. Summary  After the procedure, it is common to have a sore throat, mild stomach discomfort, bloating, and nausea.  Do not drive for 24 hours if you were given a sedative during the procedure.  Follow instructions from your health care provider about what to eat or drink after your procedure.  Return to your normal activities as told by your health care provider. This information is not intended to replace advice given to you by your health care provider. Make sure you discuss any questions you have with your health care  provider. Document Released: 07/11/2011 Document Revised: 06/11/2017 Document Reviewed: 06/11/2017 Elsevier Interactive Patient Education  2019 [employer]  Esophageal Dilatation Esophageal dilatation, also called esophageal dilation, is a procedure to widen or open (dilate) a blocked or narrowed part of the esophagus. The esophagus is the part of the body that moves food and liquid from the mouth to the stomach. You may need this procedure if:  You have a buildup of scar tissue in your esophagus that makes it difficult, painful, or impossible to swallow. This can be caused by gastroesophageal reflux disease (GERD).  You have cancer of  the esophagus.  There is a problem with how food moves through your esophagus. In some cases, you may need this procedure repeated at a later time to dilate the esophagus gradually. Tell a health care provider about:  Any allergies you have.  All medicines you are taking, including vitamins, herbs, eye drops, creams, and over-the-counter medicines.  Any problems you or family members have had with anesthetic medicines.  Any blood disorders you have.  Any surgeries you have had.  Any medical conditions you have.  Any antibiotic medicines you are required to take before dental procedures.  Whether you are pregnant or may be pregnant. What are the risks? Generally, this is a safe procedure. However, problems may occur, including:  Bleeding due to a tear in the lining of the esophagus.  A hole (perforation) in the esophagus. What happens before the procedure?  Follow instructions from your health care provider about eating or drinking restrictions.  Ask your health care provider about changing or stopping your regular medicines. This is especially important if you are taking diabetes medicines or blood thinners.  Plan to have someone take you home from the hospital or clinic.  Plan to have a responsible adult care for you for at least 24 hours after you leave the hospital or clinic. This is important. What happens during the procedure?  You may be given a medicine to help you relax (sedative).  A numbing medicine may be sprayed into the back of your throat, or you may gargle the medicine.  Your health care provider may perform the dilatation using various surgical instruments, such as: ? Simple dilators. This instrument is carefully placed in the esophagus to stretch it. ? Guided wire bougies. This involves using an endoscope to insert a wire into the esophagus. A dilator is passed over this wire to enlarge the esophagus. Then the wire is removed. ? Balloon dilators. An  endoscope with a small balloon at the end is inserted into the esophagus. The balloon is inflated to stretch the esophagus and open it up. The procedure may vary among health care providers and hospitals. What happens after the procedure?  Your blood pressure, heart rate, breathing rate, and blood oxygen level will be monitored until the medicines you were given have worn off.  Your throat may feel slightly sore and numb. This will improve slowly over time.  You will not be allowed to eat or drink until your throat is no longer numb.  When you are able to drink, urinate, and sit on the edge of the bed without nausea or dizziness, you may be able to return home. Follow these instructions at home:  Take over-the-counter and prescription medicines only as told by your health care provider.  Do not drive for 24 hours if you were given a sedative during your procedure.  You should have a responsible adult with you for 24 hours  after the procedure.  Follow instructions from your health care provider about any eating or drinking restrictions.  Do not use any products that contain nicotine or tobacco, such as cigarettes and e-cigarettes. If you need help quitting, ask your health care provider.  Keep all follow-up visits as told by your health care provider. This is important. Get help right away if you:  Have a fever.  Have chest pain.  Have pain that is not relieved by medication.  Have trouble breathing.  Have trouble swallowing.  Vomit blood. Summary  Esophageal dilatation, also called esophageal dilation, is a procedure to widen or open (dilate) a blocked or narrowed part of the esophagus.  Plan to have someone take you home from the hospital or clinic.  For this procedure, a numbing medicine may be sprayed into the back of your throat, or you may gargle the medicine.  Do not drive for 24 hours if you were given a sedative during your procedure. This information is not  intended to replace advice given to you by your health care provider. Make sure you discuss any questions you have with your health care provider. Document Released: 03/02/2005 Document Revised: 11/14/2016 Document Reviewed: 11/14/2016 Elsevier Interactive Patient Education  2019 [place], Care After These instructions provide you with information about caring for yourself after your procedure. Your health care provider may also give you more specific instructions. Your treatment has been planned according to current medical practices, but problems sometimes occur. Call your health care provider if you have any problems or questions after your procedure. What can I expect after the procedure? After your procedure, you may:  Feel sleepy for several hours.  Feel clumsy and have poor balance for several hours.  Feel forgetful about what happened after the procedure.  Have poor judgment for several hours.  Feel nauseous or vomit.  Have a sore throat if you had a breathing tube during the procedure. Follow these instructions at home: For at least 24 hours after the procedure:      Have a responsible adult stay with you. It is important to have someone help care for you until you are awake and alert.  Rest as needed.  Do not: ? Participate in activities in which you could fall or become injured. ? Drive. ? Use heavy machinery. ? Drink alcohol. ? Take sleeping pills or medicines that cause drowsiness. ? Make important decisions or sign legal documents. ? Take care of children on your own. Eating and drinking  Follow the diet that is recommended by your health care provider.  If you vomit, drink water, juice, or soup when you can drink without vomiting.  Make sure you have little or no nausea before eating solid foods. General instructions  Take over-the-counter and prescription medicines only as told by your health care provider.  If you have  sleep apnea, surgery and certain medicines can increase your risk for breathing problems. Follow instructions from your health care provider about wearing your sleep device: ? Anytime you are sleeping, including during daytime naps. ? While taking prescription pain medicines, sleeping medicines, or medicines that make you drowsy.  If you smoke, do not smoke without supervision.  Keep all follow-up visits as told by your health care provider. This is important. Contact a health care provider if:  You keep feeling nauseous or you keep vomiting.  You feel light-headed.  You develop a rash.  You have a fever. Get help right away if:  You have trouble breathing. Summary  For several hours after your procedure, you may feel sleepy and have poor judgment.  Have a responsible adult stay with you for at least 24 hours or until you are awake and alert. This information is not intended to replace advice given to you by your health care provider. Make sure you discuss any questions you have with your health care provider. Document Released: 05/02/2015 Document Revised: 08/25/2016 Document Reviewed: 05/02/2015 Elsevier Interactive Patient Education  2019 [employer].

## 2018-06-03 ENCOUNTER — Encounter (HOSPITAL_COMMUNITY)
Admission: RE | Admit: 2018-06-03 | Discharge: 2018-06-03 | Disposition: A | Discharge status: Home / Self Care | Payer: Medicare Other | Source: Ambulatory Visit | Attending: Internal Medicine | Admitting: Internal Medicine

## 2018-06-03 ENCOUNTER — Encounter (HOSPITAL_COMMUNITY): Payer: Self-pay

## 2018-06-03 ENCOUNTER — Other Ambulatory Visit: Payer: Self-pay

## 2018-06-03 DIAGNOSIS — G4730 Sleep apnea, unspecified: Secondary | ICD-10-CM | POA: Diagnosis not present

## 2018-06-03 DIAGNOSIS — Z6835 Body mass index (BMI) 35.0-35.9, adult: Secondary | ICD-10-CM | POA: Diagnosis not present

## 2018-06-03 DIAGNOSIS — Z79899 Other long term (current) drug therapy: Secondary | ICD-10-CM | POA: Diagnosis not present

## 2018-06-03 DIAGNOSIS — R1013 Epigastric pain: Secondary | ICD-10-CM | POA: Diagnosis not present

## 2018-06-03 DIAGNOSIS — Z9884 Bariatric surgery status: Secondary | ICD-10-CM | POA: Diagnosis not present

## 2018-06-03 DIAGNOSIS — E1140 Type 2 diabetes mellitus with diabetic neuropathy, unspecified: Secondary | ICD-10-CM | POA: Diagnosis not present

## 2018-06-03 DIAGNOSIS — R1310 Dysphagia, unspecified: Secondary | ICD-10-CM | POA: Diagnosis not present

## 2018-06-03 DIAGNOSIS — Z8719 Personal history of other diseases of the digestive system: Secondary | ICD-10-CM | POA: Diagnosis not present

## 2018-06-03 DIAGNOSIS — K5900 Constipation, unspecified: Secondary | ICD-10-CM | POA: Diagnosis not present

## 2018-06-03 DIAGNOSIS — K210 Gastro-esophageal reflux disease with esophagitis: Secondary | ICD-10-CM | POA: Diagnosis not present

## 2018-06-03 DIAGNOSIS — F329 Major depressive disorder, single episode, unspecified: Secondary | ICD-10-CM | POA: Diagnosis not present

## 2018-06-03 DIAGNOSIS — Z01812 Encounter for preprocedural laboratory examination: Secondary | ICD-10-CM | POA: Insufficient documentation

## 2018-06-03 DIAGNOSIS — J45909 Unspecified asthma, uncomplicated: Secondary | ICD-10-CM | POA: Diagnosis not present

## 2018-06-03 DIAGNOSIS — Z79890 Hormone replacement therapy (postmenopausal): Secondary | ICD-10-CM | POA: Diagnosis not present

## 2018-06-03 DIAGNOSIS — E669 Obesity, unspecified: Secondary | ICD-10-CM | POA: Diagnosis not present

## 2018-06-03 DIAGNOSIS — G8929 Other chronic pain: Secondary | ICD-10-CM | POA: Diagnosis not present

## 2018-06-03 DIAGNOSIS — F419 Anxiety disorder, unspecified: Secondary | ICD-10-CM | POA: Diagnosis not present

## 2018-06-03 DIAGNOSIS — I10 Essential (primary) hypertension: Secondary | ICD-10-CM | POA: Diagnosis not present

## 2018-06-03 DIAGNOSIS — Z794 Long term (current) use of insulin: Secondary | ICD-10-CM | POA: Diagnosis not present

## 2018-06-03 HISTORY — DX: Personal history of urinary calculi: Z87.442

## 2018-06-03 LAB — BASIC METABOLIC PANEL
Anion gap: 12 (ref 5–15)
BUN: 13 mg/dL (ref 6–20)
CO2: 22 mmol/L (ref 22–32)
Calcium: 9 mg/dL (ref 8.9–10.3)
Chloride: 103 mmol/L (ref 98–111)
Creatinine, Ser: 0.72 mg/dL (ref 0.44–1.00)
GFR calc Af Amer: >60 mL/min (ref >60)
GFR calc non Af Amer: >60 mL/min (ref >60)
Glucose, Bld: 245 mg/dL — ABNORMAL HIGH (ref 70–99)
Potassium: 3.7 mmol/L (ref 3.5–5.1)
Sodium: 137 mmol/L (ref 135–145)

## 2018-06-06 ENCOUNTER — Ambulatory Visit (HOSPITAL_COMMUNITY): Payer: Medicare Other | Admitting: Anesthesiology

## 2018-06-06 ENCOUNTER — Other Ambulatory Visit: Payer: Self-pay

## 2018-06-06 ENCOUNTER — Ambulatory Visit (HOSPITAL_COMMUNITY)
Admission: RE | Admit: 2018-06-06 | Discharge: 2018-06-06 | Disposition: A | Discharge status: Home / Self Care | Payer: Medicare Other | Attending: Internal Medicine | Admitting: Internal Medicine

## 2018-06-06 ENCOUNTER — Encounter (HOSPITAL_COMMUNITY): Payer: Self-pay | Admitting: Emergency Medicine

## 2018-06-06 ENCOUNTER — Encounter (HOSPITAL_COMMUNITY)
Admission: RE | Disposition: A | Discharge status: Home / Self Care | Payer: Self-pay | Source: Home / Self Care | Attending: Internal Medicine

## 2018-06-06 DIAGNOSIS — I10 Essential (primary) hypertension: Secondary | ICD-10-CM | POA: Insufficient documentation

## 2018-06-06 DIAGNOSIS — E1140 Type 2 diabetes mellitus with diabetic neuropathy, unspecified: Secondary | ICD-10-CM | POA: Diagnosis not present

## 2018-06-06 DIAGNOSIS — Z8719 Personal history of other diseases of the digestive system: Secondary | ICD-10-CM | POA: Insufficient documentation

## 2018-06-06 DIAGNOSIS — J45909 Unspecified asthma, uncomplicated: Secondary | ICD-10-CM | POA: Diagnosis not present

## 2018-06-06 DIAGNOSIS — Z6835 Body mass index (BMI) 35.0-35.9, adult: Secondary | ICD-10-CM | POA: Insufficient documentation

## 2018-06-06 DIAGNOSIS — K209 Esophagitis, unspecified: Secondary | ICD-10-CM

## 2018-06-06 DIAGNOSIS — K210 Gastro-esophageal reflux disease with esophagitis: Secondary | ICD-10-CM | POA: Insufficient documentation

## 2018-06-06 DIAGNOSIS — R1013 Epigastric pain: Secondary | ICD-10-CM | POA: Diagnosis not present

## 2018-06-06 DIAGNOSIS — F329 Major depressive disorder, single episode, unspecified: Secondary | ICD-10-CM | POA: Insufficient documentation

## 2018-06-06 DIAGNOSIS — G4730 Sleep apnea, unspecified: Secondary | ICD-10-CM | POA: Insufficient documentation

## 2018-06-06 DIAGNOSIS — Z79899 Other long term (current) drug therapy: Secondary | ICD-10-CM | POA: Insufficient documentation

## 2018-06-06 DIAGNOSIS — E669 Obesity, unspecified: Secondary | ICD-10-CM | POA: Insufficient documentation

## 2018-06-06 DIAGNOSIS — F419 Anxiety disorder, unspecified: Secondary | ICD-10-CM | POA: Insufficient documentation

## 2018-06-06 DIAGNOSIS — K5900 Constipation, unspecified: Secondary | ICD-10-CM | POA: Insufficient documentation

## 2018-06-06 DIAGNOSIS — G8929 Other chronic pain: Secondary | ICD-10-CM | POA: Insufficient documentation

## 2018-06-06 DIAGNOSIS — Z794 Long term (current) use of insulin: Secondary | ICD-10-CM | POA: Insufficient documentation

## 2018-06-06 DIAGNOSIS — Z9884 Bariatric surgery status: Secondary | ICD-10-CM | POA: Diagnosis not present

## 2018-06-06 DIAGNOSIS — R1310 Dysphagia, unspecified: Secondary | ICD-10-CM | POA: Diagnosis not present

## 2018-06-06 DIAGNOSIS — Z79890 Hormone replacement therapy (postmenopausal): Secondary | ICD-10-CM | POA: Insufficient documentation

## 2018-06-06 HISTORY — PX: ESOPHAGOGASTRODUODENOSCOPY (EGD) WITH PROPOFOL: SHX5813

## 2018-06-06 HISTORY — PX: MALONEY DILATION: SHX5535

## 2018-06-06 LAB — GLUCOSE, CAPILLARY: Glucose-Capillary: 134 mg/dL — ABNORMAL HIGH (ref 70–99)

## 2018-06-06 SURGERY — ESOPHAGOGASTRODUODENOSCOPY (EGD) WITH PROPOFOL
Anesthesia: Monitor Anesthesia Care

## 2018-06-06 MED ORDER — PROPOFOL 500 MG/50ML IV EMUL
INTRAVENOUS | Status: DC | PRN
  Administered 2018-06-06: 150 ug/kg/min via INTRAVENOUS

## 2018-06-06 MED ORDER — LACTATED RINGERS IV SOLN
INTRAVENOUS | Status: DC
  Administered 2018-06-06: 08:00:00 via INTRAVENOUS

## 2018-06-06 MED ORDER — CHLORHEXIDINE GLUCONATE CLOTH 2 % EX PADS: 6.0000 | MEDICATED_PAD | Freq: Once | CUTANEOUS | Status: DC

## 2018-06-06 MED ORDER — PROPOFOL 10 MG/ML IV BOLUS
INTRAVENOUS | Status: AC
  Filled 2018-06-06: qty 40

## 2018-06-06 MED ORDER — PROPOFOL 10 MG/ML IV BOLUS
INTRAVENOUS | Status: DC | PRN
  Administered 2018-06-06: 10 mg via INTRAVENOUS
  Administered 2018-06-06: 20 mg via INTRAVENOUS
  Administered 2018-06-06: 40 mg via INTRAVENOUS
  Administered 2018-06-06 (×3): 20 mg via INTRAVENOUS

## 2018-06-06 NOTE — Discharge Instructions (Signed)
EGD Discharge instructions Please read the instructions outlined below and refer to this sheet in the next few weeks. These discharge instructions provide you with general information on caring for yourself after you leave the hospital. Your doctor may also give you specific instructions. While your treatment has been planned according to the most current medical practices available, unavoidable complications occasionally occur. If you have any problems or questions after discharge, please call your doctor. ACTIVITY  You may resume your regular activity but move at a slower pace for the next 24 hours.   Take frequent rest periods for the next 24 hours.   Walking will help expel (get rid of) the air and reduce the bloated feeling in your abdomen.   No driving for 24 hours (because of the anesthesia (medicine) used during the test).   You may shower.   Do not sign any important legal documents or operate any machinery for 24 hours (because of the anesthesia used during the test).  NUTRITION  Drink plenty of fluids.   You may resume your normal diet.   Begin with a light meal and progress to your normal diet.   Avoid alcoholic beverages for 24 hours or as instructed by your caregiver.  MEDICATIONS  You may resume your normal medications unless your caregiver tells you otherwise.  WHAT YOU CAN EXPECT TODAY  You may experience abdominal discomfort such as a feeling of fullness or gas pains.  FOLLOW-UP  Your doctor will discuss the results of your test with you.  SEEK IMMEDIATE MEDICAL ATTENTION IF ANY OF THE FOLLOWING OCCUR:  Excessive nausea (feeling sick to your stomach) and/or vomiting.   Severe abdominal pain and distention (swelling).   Trouble swallowing.   Temperature over 101 F (37.8 C).   Rectal bleeding or vomiting of blood.   Gastroesophageal Reflux Disease, Adult Gastroesophageal reflux (GER) happens when acid from the stomach flows up into the tube that  connects the mouth and the stomach (esophagus). Normally, food travels down the esophagus and stays in the stomach to be digested. With GER, food and stomach acid sometimes move back up into the esophagus. You may have a disease called gastroesophageal reflux disease (GERD) if the reflux: Happens often. Causes frequent or very bad symptoms. Causes problems such as damage to the esophagus. When this happens, the esophagus becomes sore and swollen (inflamed). Over time, GERD can make small holes (ulcers) in the lining of the esophagus. What are the causes? This condition is caused by a problem with the muscle between the esophagus and the stomach. When this muscle is weak or not normal, it does not close properly to keep food and acid from coming back up from the stomach. The muscle can be weak because of: Tobacco use. Pregnancy. Having a certain type of hernia (hiatal hernia). Alcohol use. Certain foods and drinks, such as coffee, chocolate, onions, and peppermint. What increases the risk? You are more likely to develop this condition if you: Are overweight. Have a disease that affects your connective tissue. Use NSAID medicines. What are the signs or symptoms? Symptoms of this condition include: Heartburn. Difficult or painful swallowing. The feeling of having a lump in the throat. A bitter taste in the mouth. Bad breath. Having a lot of saliva. Having an upset or bloated stomach. Belching. Chest pain. Different conditions can cause chest pain. Make sure you see your doctor if you have chest pain. Shortness of breath or noisy breathing (wheezing). Ongoing (chronic) cough or a cough at  night. Wearing away of the surface of teeth (tooth enamel). Weight loss. How is this treated? Treatment will depend on how bad your symptoms are. Your doctor may suggest: Changes to your diet. Medicine. Surgery. Follow these instructions at home: Eating and drinking  Follow a diet as told by your  doctor. You may need to avoid foods and drinks such as: Coffee and tea (with or without caffeine). Drinks that contain alcohol. Energy drinks and sports drinks. Bubbly (carbonated) drinks or sodas. Chocolate and cocoa. Peppermint and mint flavorings. Garlic and onions. Horseradish. Spicy and acidic foods. These include peppers, chili powder, curry powder, vinegar, hot sauces, and BBQ sauce. Citrus fruit juices and citrus fruits, such as oranges, lemons, and limes. Tomato-based foods. These include red sauce, chili, salsa, and pizza with red sauce. Fried and fatty foods. These include donuts, french fries, potato chips, and high-fat dressings. High-fat meats. These include hot dogs, rib eye steak, sausage, ham, and bacon. High-fat dairy items, such as whole milk, butter, and cream cheese. Eat small meals often. Avoid eating large meals. Avoid drinking large amounts of liquid with your meals. Avoid eating meals during the 2-3 hours before bedtime. Avoid lying down right after you eat. Do not exercise right after you eat. Lifestyle  Do not use any products that contain nicotine or tobacco. These include cigarettes, e-cigarettes, and chewing tobacco. If you need help quitting, ask your doctor. Try to lower your stress. If you need help doing this, ask your doctor. If you are overweight, lose an amount of weight that is healthy for you. Ask your doctor about a safe weight loss goal. General instructions Pay attention to any changes in your symptoms. Take over-the-counter and prescription medicines only as told by your doctor. Do not take aspirin, ibuprofen, or other NSAIDs unless your doctor says it is okay. Wear loose clothes. Do not wear anything tight around your waist. Raise (elevate) the head of your bed about 6 inches (15 cm). Avoid bending over if this makes your symptoms worse. Keep all follow-up visits as told by your doctor. This is important. Contact a doctor if: You have new  symptoms. You lose weight and you do not know why. You have trouble swallowing or it hurts to swallow. You have wheezing or a cough that keeps happening. Your symptoms do not get better with treatment. You have a hoarse voice. Get help right away if: You have pain in your arms, neck, jaw, teeth, or back. You feel sweaty, dizzy, or light-headed. You have chest pain or shortness of breath. You throw up (vomit) and your throw-up looks like blood or coffee grounds. You pass out (faint). Your poop (stool) is bloody or black. You cannot swallow, drink, or eat. Summary If a person has gastroesophageal reflux disease (GERD), food and stomach acid move back up into the esophagus and cause symptoms or problems such as damage to the esophagus. Treatment will depend on how bad your symptoms are. Follow a diet as told by your doctor. Take all medicines only as told by your doctor. This information is not intended to replace advice given to you by your health care provider. Make sure you discuss any questions you have with your health care provider. Document Released: 06/28/2007 Document Revised: 07/18/2017 Document Reviewed: 07/18/2017 Elsevier Interactive Patient Education  2019 [employer].     GERD information provided  Begin Protonix 40 mg daily.  Contrast CT of the abdomen and pelvis for abdominal pain; status post gastric bypass surgery.  Rule out internal hernia, etc.  Further recommendations to follow.

## 2018-06-06 NOTE — Transfer of Care (Signed)
Immediate Anesthesia Transfer of Care Note  Patient: Carrie Cook  Procedure(s) Performed: ESOPHAGOGASTRODUODENOSCOPY (EGD) WITH PROPOFOL (N/A ) MALONEY DILATION (N/A )  Patient Location: PACU  Anesthesia Type:MAC  Level of Consciousness: awake  Airway & Oxygen Therapy: Patient Spontanous Breathing  Post-op Assessment: Report given to RN  Post vital signs: Reviewed and stable  Last Vitals:  Vitals Value Taken Time  BP 116/79 06/06/2018  8:36 AM  Temp 36.8 C 06/06/2018  8:32 AM  Pulse 84 06/06/2018  8:43 AM  Resp 15 06/06/2018  8:43 AM  SpO2 96 % 06/06/2018  8:43 AM  Vitals shown include unvalidated device data.  Last Pain:  Vitals:   06/06/18 0832  TempSrc:   PainSc: 0-No pain         Complications: No apparent anesthesia complications

## 2018-06-06 NOTE — Addendum Note (Signed)
Addendum  created 06/06/18 1241 by Mickel Baas, CRNA   Charge Capture section accepted

## 2018-06-06 NOTE — Op Note (Signed)
[facility] Patient Name: Carrie Cook Procedure Date: 06/06/2018 7:55 AM MRN: [id number] Date of Birth: 1960/04/21 Attending MD: Norvel Richards , MD CSN: [id number] Age: 58 Admit Type: Outpatient Procedure:                Upper GI endoscopy Indications:              Epigastric abdominal pain, Dysphagia Providers:                Norvel Richards, MD, Jeanann Lewandowsky. Sharon Seller, RN,                            Raphael Gibney, Technician, Aram Candela Referring MD:              Medicines:                Propofol per Anesthesia Complications:            No immediate complications. Estimated Blood Loss:     Estimated blood loss: none. Procedure:                After obtaining informed consent, the endoscope was                            passed under direct vision. Throughout the                            procedure, the patient's blood pressure, pulse, and                            oxygen saturations were monitored continuously. The                            GIF-H190 ([id number]) scope was introduced through the                            and advanced to the afferent jejunal loop. The                            upper GI endoscopy was accomplished without                            difficulty. The patient tolerated the procedure                            well. Scope In: 8:21:44 AM Scope Out: 8:26:55 AM Total Procedure Duration: 0 hours 5 minutes 11 seconds  Findings:      LA Grade A (one or more mucosal breaks less than 5 mm, not extending       between tops of 2 mucosal folds) esophagitis with no bleeding was found.       Esophagus appeared widely patent. No Barrett's epithelium seen. The       scope was withdrawn. Dilation was performed with a Maloney dilator with       mild resistance at 34 Fr. The dilation site was examined following       endoscope reinsertion and showed no change. Estimated blood loss: none.      Small gastric  pouch. Surgically altered stomach with Jasmine Pang       2configuration; normal appearing effernet / afferent limb. Somewhat       friable anastomosis but no ulcer or infiltrating process. Surgical       staple protruding from anastomosis; No ulcer or infiltrating process       seen. Impression:               - LA Grade A esophagitis. Dilated.                           -Status post prior gastric obesity procedure with                            Billroth II type configuration. Patient has                            complicated reflux by definition, finding                            esophagitis. Today's findings would not initially                            explain all of her abdominal pain. Moderate Sedation:      Moderate (conscious) sedation was personally administered by an       anesthesia professional. The following parameters were monitored: oxygen       saturation, heart rate, blood pressure, respiratory rate, EKG, adequacy       of pulmonary ventilation, and response to care. Recommendation:           - Patient has a contact number available for                            emergencies. The signs and symptoms of potential                            delayed complications were discussed with the                            patient. Return to normal activities tomorrow.                            Written discharge instructions were provided to the                            patient.                           - Advance diet as tolerated.                           - Continue present medications. Begin Protonix 40                            mg daily. Proceed with a contrast CT of the abdomen  and pelvis to further evaluate nominal pain; rule                            out internal hernia, etc. Plan for office visit in                            3 months. Procedure Code(s):        --- Professional ---                           [phone], Esophagogastroduodenoscopy, flexible,                             transoral; diagnostic, including collection of                            specimen(s) by brushing or washing, when performed                            (separate procedure)                           43450, Dilation of esophagus, by unguided sound or                            bougie, single or multiple passes Diagnosis Code(s):        --- Professional ---                           K20.9, Esophagitis, unspecified                           R10.13, Epigastric pain                           R13.10, Dysphagia, unspecified CPT copyright 2019 [employer]. All rights reserved. The codes documented in this report are preliminary and upon coder review may  be revised to meet current compliance requirements. Cristopher Estimable. Sylvana Bonk, MD Norvel Richards, MD 06/06/2018 8:39:45 AM This report has been signed electronically. Number of Addenda: 0

## 2018-06-06 NOTE — Interval H&P Note (Signed)
History and Physical Interval Note:  06/06/2018 8:06 AM  Carrie Cook  has presented today for surgery, with the diagnosis of dysphagia, abdominal pain.  The various methods of treatment have been discussed with the patient and family. After consideration of risks, benefits and other options for treatment, the patient has consented to  Procedure(s) with comments: ESOPHAGOGASTRODUODENOSCOPY (EGD) WITH PROPOFOL (N/A) - 8:00am MALONEY DILATION (N/A) as a surgical intervention.  The patient's history has been reviewed, patient examined, no change in status, stable for surgery.  I have reviewed the patient's chart and labs.  Questions were answered to the patient's satisfaction.     Carrie Cook  No change.  Dysphagia, upper abdominal pain, reflux.  EGD with esophageal dilation plan.The risks, benefits, limitations, alternatives and imponderables have been reviewed with the patient. Potential for esophageal dilation, biopsy, etc. have also been reviewed.  Questions have been answered. All parties agreeable.

## 2018-06-06 NOTE — Anesthesia Preprocedure Evaluation (Signed)
Anesthesia Evaluation  Patient identified by MRN, date of birth, ID band Patient awake    Reviewed: Allergy & Precautions, H&P , NPO status , Patient's Chart, lab work & pertinent test results, reviewed documented beta blocker date and time   History of Anesthesia Complications (+) PONV and history of anesthetic complications  Airway Mallampati: II  TM Distance: >3 FB Neck ROM: full   Comment: Pt C/O TMJ symptoms Dental no notable dental hx.    Pulmonary neg pulmonary ROS, shortness of breath, asthma , sleep apnea ,    Pulmonary exam normal breath sounds clear to auscultation       Cardiovascular Exercise Tolerance: Good hypertension, negative cardio ROS   Rhythm:regular Rate:Normal     Neuro/Psych  Headaches, PSYCHIATRIC DISORDERS Anxiety Depression negative neurological ROS  negative psych ROS   GI/Hepatic negative GI ROS, Neg liver ROS, hiatal hernia, GERD  ,  Endo/Other  negative endocrine ROSdiabetes  Renal/GU Renal diseasenegative Renal ROS  negative genitourinary   Musculoskeletal   Abdominal   Peds  Hematology negative hematology ROS (+) Blood dyscrasia, anemia ,   Anesthesia Other Findings   Reproductive/Obstetrics negative OB ROS                             Anesthesia Physical  Anesthesia Plan  ASA: II  Anesthesia Plan: MAC   Post-op Pain Management:    Induction:   PONV Risk Score and Plan:   Airway Management Planned:   Additional Equipment:   Intra-op Plan:   Post-operative Plan:   Informed Consent: I have reviewed the patients History and Physical, chart, labs and discussed the procedure including the risks, benefits and alternatives for the proposed anesthesia with the patient or authorized representative who has indicated his/her understanding and acceptance.     Dental Advisory Given  Plan Discussed with: CRNA  Anesthesia Plan Comments:          Anesthesia Quick Evaluation

## 2018-06-06 NOTE — Anesthesia Postprocedure Evaluation (Signed)
Anesthesia Post Note  Patient: Carrie Cook  Procedure(s) Performed: ESOPHAGOGASTRODUODENOSCOPY (EGD) WITH PROPOFOL (N/A ) MALONEY DILATION (N/A )  Patient location during evaluation: PACU Anesthesia Type: MAC Level of consciousness: awake and alert and oriented Pain management: pain level controlled Vital Signs Assessment: post-procedure vital signs reviewed and stable Respiratory status: spontaneous breathing Cardiovascular status: stable Postop Assessment: no apparent nausea or vomiting Anesthetic complications: no     Last Vitals:  Vitals:   06/06/18 0648 06/06/18 0832  BP: 131/81 116/79  Pulse: 90   Resp: 17 20  Temp: 37 C 36.8 C  SpO2: 97% 97%    Last Pain:  Vitals:   06/06/18 0832  TempSrc:   PainSc: 0-No pain                 Ben Sanz

## 2018-06-10 ENCOUNTER — Encounter (HOSPITAL_COMMUNITY): Payer: Self-pay | Admitting: Internal Medicine

## 2018-07-11 ENCOUNTER — Other Ambulatory Visit: Payer: Self-pay | Admitting: Obstetrics & Gynecology

## 2018-07-22 ENCOUNTER — Ambulatory Visit (INDEPENDENT_AMBULATORY_CARE_PROVIDER_SITE_OTHER): Payer: Medicare Other | Admitting: Family Medicine

## 2018-07-22 ENCOUNTER — Other Ambulatory Visit: Payer: Self-pay

## 2018-07-22 DIAGNOSIS — I10 Essential (primary) hypertension: Secondary | ICD-10-CM

## 2018-07-22 DIAGNOSIS — Z794 Long term (current) use of insulin: Secondary | ICD-10-CM

## 2018-07-22 DIAGNOSIS — K219 Gastro-esophageal reflux disease without esophagitis: Secondary | ICD-10-CM

## 2018-07-22 DIAGNOSIS — E119 Type 2 diabetes mellitus without complications: Secondary | ICD-10-CM | POA: Diagnosis not present

## 2018-07-22 DIAGNOSIS — R5383 Other fatigue: Secondary | ICD-10-CM | POA: Diagnosis not present

## 2018-07-22 DIAGNOSIS — E7849 Other hyperlipidemia: Secondary | ICD-10-CM | POA: Diagnosis not present

## 2018-07-22 MED ORDER — LOSARTAN POTASSIUM 100 MG PO TABS: ORAL_TABLET | ORAL | 1 refills | Status: DC

## 2018-07-22 NOTE — Progress Notes (Signed)
Subjective:    Patient ID: Carrie Cook, female    DOB: Nov 30, 1960, 58 y.o.   MRN: [id number]  Diabetes She presents for her follow-up diabetic visit. She has type 2 diabetes mellitus. Pertinent negatives for hypoglycemia include no confusion or dizziness. Pertinent negatives for diabetes include no chest pain, no fatigue, no polydipsia, no polyphagia and no weakness. Risk factors for coronary artery disease include dyslipidemia, diabetes mellitus and post-menopausal. Current diabetic treatment includes insulin injections and oral agent (monotherapy). She is compliant with treatment all of the time.   Patient stopped her Lipitor and Abilify due to body aches (Abilify prescribed by Mental health)  Patient has hyperlipidemia she is at risk of having cardiac related symptoms/heart attack related to her condition is very important for her to be on a statin we discussed this in detail she will try the statin 2 days/week to see if she does better   Patient for blood pressure check up.  The patient does have hypertension.  The patient is on medication.  Patient relates compliance with meds. Todays BP reviewed with the patient. Patient denies issues with medication. Patient relates reasonable diet. Patient tries to minimize salt. Patient aware of BP goals.  Patient does have ongoing trouble with reflux.  Takes medication on a regular basis.  Tries to minimize foods as best they can.  They understand the importance of dietary compliance.  May also try to avoid eating a large meal close to bedtime.  Patient denies any dysphagia denies hematochezia.  States medicine does a good job keeping the problem under good control.  Without the medication may certainly have issues.They desire to continue taking their medication.  Patient has morbid obesity trying to watch her diet try to watch her intake and exercise on a regular basis  Patient with high risk of COVID related illness I talked with her in detail  about how to reduce this Virtual Visit via Video Note  I connected with Carrie Cook on 07/22/18 at  3:00 PM EDT by a video enabled telemedicine application and verified that I am speaking with the correct person using two identifiers.  Location: Patient: home Provider: office   I discussed the limitations of evaluation and management by telemedicine and the availability of in person appointments. The patient expressed understanding and agreed to proceed.  History of Present Illness:    Observations/Objective:   Assessment and Plan:   Follow Up Instructions:    I discussed the assessment and treatment plan with the patient. The patient was provided an opportunity to ask questions and all were answered. The patient agreed with the plan and demonstrated an understanding of the instructions.   The patient was advised to call back or seek an in-person evaluation if the symptoms worsen or if the condition fails to improve as anticipated.  I provided 25 minutes of non-face-to-face time during this encounter.        Review of Systems  Constitutional: Negative for activity change, appetite change and fatigue.  HENT: Negative for congestion and rhinorrhea.   Respiratory: Negative for cough and shortness of breath.   Cardiovascular: Negative for chest pain and leg swelling.  Gastrointestinal: Negative for abdominal pain and diarrhea.  Endocrine: Negative for polydipsia and polyphagia.  Skin: Negative for color change.  Neurological: Negative for dizziness and weakness.  Psychiatric/Behavioral: Negative for behavioral problems and confusion.       Objective:   Physical Exam  Patient had virtual visit Appears to be in no  distress Atraumatic Neuro able to relate and oriented No apparent resp distress Color normal       Assessment & Plan:  The patient was seen today as part of a comprehensive visit for diabetes. The importance of keeping her A1c at or below 7 was  discussed.  Importance of regular physical activity was discussed.   The importance of adherence to medication as well as a controlled low starch/sugar diet was also discussed.  Standard follow-up visit recommended.  Also patient aware failure to keep diabetes under control increases the risk of complications.  HTN- Patient was seen today as part of a visit regarding hypertension. The importance of healthy diet and regular physical activity was discussed. The importance of compliance with medications discussed.  Ideal goal is to keep blood pressure low elevated levels certainly below 219/75 when possible.  The patient was counseled that keeping blood pressure under control lessen his risk of complications.  The importance of regular follow-ups was discussed with the patient.  Low-salt diet such as DASH recommended.  Regular physical activity was recommended as well.  Patient was advised to keep regular follow-ups.  The patient was seen today as part of an evaluation regarding hyperlipidemia.  Recent lab work has been reviewed with the patient as well as the goals for good cholesterol care.  In addition to this medications have been discussed the importance of compliance with diet and medications discussed as well.  Finally the patient is aware that poor control of cholesterol, noncompliance can dramatically increase the risk of complications. The patient will keep regular office visits and the patient does agreed to periodic lab work.  The patient's BMI is calculated.  The patient does have obesity.  The patient does try to some degree staying active and watching diet.  It is in the vital signs and acknowledged.  It is above the recommended BMI for the patient's height and weight.  The patient has been counseled regarding healthy diet, restricted portions, avoiding excessive carbohydrates/sugary foods, and increase physical activity as health permits.  It is in the patient's best interest to  lower the risk of secondary illness including heart disease strokes and cancer by losing weight.  The patient acknowledges this information.  Reduction of COVID risk discussed   Patient will try cholesterol medicine 2 days/week over the next month and let us know  Patient will do lab work  25 minutes was spent with the patient.  This statement verifies that 25 minutes was indeed spent with the patient.  More than 50% of this visit-total duration of the visit-was spent in counseling and coordination of care. The issues that the patient came in for today as reflected in the diagnosis (s) please refer to documentation for further details.

## 2018-07-24 DIAGNOSIS — F339 Major depressive disorder, recurrent, unspecified: Secondary | ICD-10-CM | POA: Diagnosis not present

## 2018-07-31 DIAGNOSIS — Z794 Long term (current) use of insulin: Secondary | ICD-10-CM | POA: Diagnosis not present

## 2018-07-31 DIAGNOSIS — E119 Type 2 diabetes mellitus without complications: Secondary | ICD-10-CM | POA: Diagnosis not present

## 2018-07-31 DIAGNOSIS — E7849 Other hyperlipidemia: Secondary | ICD-10-CM | POA: Diagnosis not present

## 2018-07-31 DIAGNOSIS — I10 Essential (primary) hypertension: Secondary | ICD-10-CM | POA: Diagnosis not present

## 2018-07-31 DIAGNOSIS — R5383 Other fatigue: Secondary | ICD-10-CM | POA: Diagnosis not present

## 2018-08-01 LAB — HEPATIC FUNCTION PANEL
ALT: 12 IU/L (ref 0–32)
AST: 11 IU/L (ref 0–40)
Albumin: 4.2 g/dL (ref 3.8–4.9)
Alkaline Phosphatase: 132 IU/L — ABNORMAL HIGH (ref 39–117)
Bilirubin Total: 0.2 mg/dL (ref 0.0–1.2)
Bilirubin, Direct: 0.06 mg/dL (ref 0.00–0.40)
Total Protein: 6.1 g/dL (ref 6.0–8.5)

## 2018-08-01 LAB — BASIC METABOLIC PANEL
BUN/Creatinine Ratio: 12 (ref 9–23)
BUN: 9 mg/dL (ref 6–24)
CO2: 23 mmol/L (ref 20–29)
Calcium: 9.1 mg/dL (ref 8.7–10.2)
Chloride: 100 mmol/L (ref 96–106)
Creatinine, Ser: 0.77 mg/dL (ref 0.57–1.00)
GFR calc Af Amer: 100 mL/min/{1.73_m2} (ref >59)
GFR calc non Af Amer: 87 mL/min/{1.73_m2} (ref >59)
Glucose: 138 mg/dL — ABNORMAL HIGH (ref 65–99)
Potassium: 4.7 mmol/L (ref 3.5–5.2)
Sodium: 139 mmol/L (ref 134–144)

## 2018-08-01 LAB — MICROALBUMIN / CREATININE URINE RATIO
Creatinine, Urine: 176.8 mg/dL
Microalb/Creat Ratio: 6 mg/g creat (ref 0–29)
Microalbumin, Urine: 10 ug/mL

## 2018-08-01 LAB — LIPID PANEL
Chol/HDL Ratio: 4.5 ratio — ABNORMAL HIGH (ref 0.0–4.4)
Cholesterol, Total: 205 mg/dL — ABNORMAL HIGH (ref 100–199)
HDL: 46 mg/dL (ref >39)
LDL Calculated: 115 mg/dL — ABNORMAL HIGH (ref 0–99)
Triglycerides: 221 mg/dL — ABNORMAL HIGH (ref 0–149)
VLDL Cholesterol Cal: 44 mg/dL — ABNORMAL HIGH (ref 5–40)

## 2018-08-01 LAB — HEMOGLOBIN A1C
Est. average glucose Bld gHb Est-mCnc: 151 mg/dL
Hgb A1c MFr Bld: 6.9 % — ABNORMAL HIGH (ref 4.8–5.6)

## 2018-08-01 LAB — TSH: TSH: 1.46 u[IU]/mL (ref 0.450–4.500)

## 2018-09-04 NOTE — Progress Notes (Signed)
Primary Care Physician:  Kathyrn Drown, MD  Primary GI: Dr. Gala Romney   Chief Complaint  Patient presents with  . Abdominal Pain    mid upper    HPI:   Carrie Cook is a 58 y.o. female presenting today with a history of gastric bypass in 2014, presenting in follow-up for epigastric pain, dysphagia, bloating, N/V present since time of gastric bypass. EGD May 2020 with LA Grade A esophagitis s/p dilation. Starting weight pre-op gastric bypass was 230-240. Lowest weight 173. Over time, started to regain.  Constipation: since gastric bypass. Alternating between constipation for 2-3 days or going multiple times a day with cow patties. Had recommended Linzess 145 but was unable to pick up. Feels backed up and unproductive.   States esophagus feels much better after taking Protonix. Reflux better after starting Protonix. Sometimes has burning up into chest. Burning in upper abdomen. Not as bad as previously. Still feels like food hangs when eating. Gets nauseated right away with eating at times. Mouth starts watering. Unable to attribute to foods. Doesn't happen all the time. Vomiting once a week, now improved but used to be more frequent. If laying on left side, eases it better. Soft foods are not an issue. Feels like a rock in her abdomen.   Staple visible at anastomosis.   Past Medical History:  Diagnosis Date  . Anemia    PT HAS HAD COLONOSCOPY AND ENDOSCOPY WORK UP - NO PROBLEMS FOUND AS SOURCE OF ANEMIA -- PT HAD IRON INFUSION AT [facility] 11/13/12-AFTER SEEING HEMATOLOGIST DR. [place] AND HE GAVE HEMATOLOGIC CLEARANCE FOR GASTRIC BYPASS SURGERY.  Marland Kitchen Anxiety   . Asthma    daily and prn inhalers  . Degenerative joint disease    right knee, spine - STATES INTERMITTENT  NUMBNESS DOWN RT LEG WITH PROLONGED STANDING OR WALKING- THINKS RELATED TO HER SPINE PROBLEMS  . Dental crowns present   . Depression   . Family history of anesthesia complication    pt's mother and sister have  hx. of post-op N/V  . Fibroids    UTERINE  . Gastroesophageal reflux disease    none for 2 years since Bariatric surgery.  . H/O hiatal hernia   . History of endometriosis   . History of kidney stones   . Hyperlipidemia   . Hypertension    under control with med., has been on med. x 2 yr.  . Insulin dependent diabetes mellitus ([place])   . Lock jaw    jaw locks open if opens mouth wide  . Migraines   . Neuropathy    FEET  . Obesity   . Palpitations   . PONV (postoperative nausea and vomiting)   . Shortness of breath    with daily activities  . Sleep apnea    post-op didn't need CPAP but now states needs it again but not using    Past Surgical History:  Procedure Laterality Date  . ABDOMINAL HYSTERECTOMY N/A 06/06/2017   Procedure: HYSTERECTOMY ABDOMINAL;  Surgeon: Florian Buff, MD;  Location: [facility];  Service: Gynecology;  Laterality: N/A;  . BREATH TEK H PYLORI N/A 08/13/2012   Procedure: BREATH TEK H PYLORI;  Surgeon: Edward Jolly, MD;  Location: [facility];  Service: General;  Laterality: N/A;  . CARPAL TUNNEL RELEASE  01/03/2012   Procedure: CARPAL TUNNEL RELEASE;  Surgeon: Wynonia Sours, MD;  Location: [facility];  Service: Orthopedics;  Laterality: Right;  .  carpal tunnel release right hand Right 12/13  . COLONOSCOPY  05/2009   [id number] hemorrhoids otherwise normal colon, rectum and terminal ileum  . COLONOSCOPY WITH ESOPHAGOGASTRODUODENOSCOPY (EGD) N/A 10/28/2012   Procedure: COLONOSCOPY WITH ESOPHAGOGASTRODUODENOSCOPY (EGD);  Surgeon: Daneil Dolin, MD;  [facility];  Service: Endoscopy;  Laterality: N/A;  7:30  . DILITATION & CURRETTAGE/HYSTROSCOPY WITH NOVASURE ABLATION N/A 07/22/2014   Procedure: DILATATION & CURETTAGE/HYSTEROSCOPY WITH NOVASURE ABLATION; uterine length 6.0 cm; uterine width 3.8 cm; total ablation time 1 minute 15 seconds;  Surgeon: Florian Buff, MD;  Location: [facility];  Service: Gynecology;  Laterality: N/A;  .  ESOPHAGOGASTRODUODENOSCOPY  5/013/2011   [id number] esophagus/multiple polyps removed s/p (hyperplastic). Gastritis without H.pylori.  . ESOPHAGOGASTRODUODENOSCOPY (EGD) WITH PROPOFOL N/A 06/06/2018   with LA Grade A esophagitis s/p dilation.   Marland Kitchen GASTRIC ROUX-EN-Y N/A 11/19/2012   Procedure: LAPAROSCOPIC ROUX-EN-Y GASTRIC;  Surgeon: Edward Jolly, MD;  Location: [facility];  Service: General;  Laterality: N/A;  . GIVENS CAPSULE STUDY N/A 10/28/2012   Procedure: GIVENS CAPSULE STUDY;  Surgeon: Daneil Dolin, MD;  [facility];  Service: Endoscopy;  Laterality: N/A;  . KNEE ARTHROSCOPY WITH MEDIAL MENISECTOMY Right 10/28/2015   Procedure: RIGHT KNEE ARTHROSCOPY WITH MEDIAL MENISECTOMY;  Surgeon: Carole Civil, MD;  Location: [facility];  Service: Orthopedics;  Laterality: Right;  . LAPAROSCOPY     due to endometriosis  . MALONEY DILATION N/A 06/06/2018   Procedure: Venia Minks DILATION;  Surgeon: Daneil Dolin, MD;  [facility];  Service: Endoscopy;  Laterality: N/A;  . PELVIC LAPAROSCOPY  11/04/1999   with fulguration of endometriosis  . SALPINGOOPHORECTOMY Bilateral 06/06/2017   Procedure: SALPINGO OOPHORECTOMY;  Surgeon: Florian Buff, MD;  Location: [facility];  Service: Gynecology;  Laterality: Bilateral;  . TRIGGER FINGER RELEASE Right 09/24/2012   Procedure: RELEASE A-1 PULLEY OF RIGHT THUMB;  Surgeon: Wynonia Sours, MD;  Location: [facility];  Service: Orthopedics;  Laterality: Right;  . TUBAL LIGATION  1993    Current Outpatient Medications  Medication Sig Dispense Refill  . albuterol (PROVENTIL HFA;VENTOLIN HFA) 108 (90 Base) MCG/ACT inhaler Inhale 2 puffs into the lungs every 6 (six) hours as needed for wheezing. 1 Inhaler 6  . ARIPiprazole (ABILIFY) 2 MG tablet Take 1 tablet by mouth daily.    . cetirizine (ZYRTEC) 10 MG tablet Take 10 mg by mouth daily.    . Cyanocobalamin (VITAMIN B-12) 1000 MCG SUBL Place 1,000 mcg under the tongue daily.    Marland Kitchen  estradiol (ESTRACE) 2 MG tablet Take 1 tablet (2 mg total) by mouth at bedtime. 90 tablet 3  . fluticasone (FLOVENT HFA) 220 MCG/ACT inhaler Inhale 2 puffs into the lungs 2 (two) times daily. (Patient taking differently: Inhale 2 puffs into the lungs 2 (two) times daily as needed (shortness of breath). ) 1 Inhaler 12  . Insulin Glargine (LANTUS SOLOSTAR) 100 UNIT/ML Solostar Pen Use 24 units qhs. May titrate to 30 units qhs (Patient taking differently: Inject 24 Units into the skin at bedtime. May titrate to 30 units qhs) 5 pen 5  . Insulin Pen Needle (PEN NEEDLES) 32G X 4 MM MISC 32 g by Does not apply route daily. 90 each 5  . lamoTRIgine (LAMICTAL) 100 MG tablet Take 150 mg by mouth at bedtime.   3  . losartan (COZAAR) 100 MG tablet TAKE 1 TABLET BY MOUTH DAILY 90 tablet 1  . Melatonin 10 MG SUBL Place  10 mg under the tongue at bedtime.     . metFORMIN (GLUCOPHAGE) 500 MG tablet TAKE 1 TABLET(500 MG) BY MOUTH TWICE DAILY WITH A MEAL (Patient taking differently: Take 500 mg by mouth 2 (two) times daily with a meal. ) 180 tablet 1  . Multiple Vitamin (MULTIVITAMIN WITH MINERALS) TABS tablet Take 2 tablets by mouth daily.     . pantoprazole (PROTONIX) 40 MG tablet Take 1 tablet by mouth daily.    Marland Kitchen venlafaxine (EFFEXOR) 75 MG tablet Take 75 mg by mouth 3 (three) times daily.   1   No current facility-administered medications for this visit.     Allergies as of 09/05/2018 - Review Complete 09/05/2018  Allergen Reaction Noted  . Qvar [beclomethasone] Shortness Of Breath 04/14/2014  . Codeine Nausea And Vomiting   . Prozac [fluoxetine hcl] Hives 05/11/2009  . Lipitor [atorvastatin calcium] Other (See Comments) 05/31/2018  . Dyazide [hydrochlorothiazide w-triamterene] Cough 05/11/2009  . Erythromycin Nausea Only 01/19/2011  . Lisinopril Cough 05/11/2009  . Pravastatin Other (See Comments) 06/21/2012  . Sulfonamide derivatives Hives and Rash     Family History  Problem Relation Age of Onset   . Hypertension Father   . Hyperlipidemia Father   . COPD Father   . COPD Mother   . Heart disease Mother   . Cancer Mother        Cervix  . Anesthesia problems Mother        post-op N/V  . Thyroid disease Mother   . Diabetes Maternal Grandfather   . Thyroid disease Sister   . Anesthesia problems Sister        post-op N/V  . Thyroid disease Paternal Uncle   . Diabetes Other   . Colon cancer Neg Hx   . Colon polyps Neg Hx     Social History   Socioeconomic History  . Marital status: Married    Spouse name: Not on file  . Number of children: 1  . Years of education: Not on file  . Highest education level: Not on file  Occupational History  . Occupation: disability  Social Needs  . Financial resource strain: Not on file  . Food insecurity    Worry: Not on file    Inability: Not on file  . Transportation needs    Medical: Not on file    Non-medical: Not on file  Tobacco Use  . Smoking status: Never Smoker  . Smokeless tobacco: Never Used  Substance and Sexual Activity  . Alcohol use: No  . Drug use: No  . Sexual activity: Not Currently    Birth control/protection: Surgical  Lifestyle  . Physical activity    Days per week: Not on file    Minutes per session: Not on file  . Stress: Not on file  Relationships  . Social Herbalist on phone: Not on file    Gets together: Not on file    Attends religious service: Not on file    Active member of club or organization: Not on file    Attends meetings of clubs or organizations: Not on file    Relationship status: Not on file  Other Topics Concern  . Not on file  Social History Narrative  . Not on file    Review of Systems: Gen: Denies fever, chills, anorexia. Denies fatigue, weakness, weight loss.  CV: Denies chest pain, palpitations, syncope, peripheral edema, and claudication. Resp: Denies dyspnea at rest, cough, wheezing, coughing up blood, and  pleurisy. GI: see HPI Derm: Denies rash, itching, dry  skin Psych: Denies depression, anxiety, memory loss, confusion. No homicidal or suicidal ideation.  Heme: Denies bruising, bleeding, and enlarged lymph nodes.  Physical Exam: BP (!) 154/89   Pulse (!) 104   Temp (!) 96.9 F (36.1 C) (Temporal)   Ht 5\' 3"  (1.6 m)   Wt 200 lb 3.2 oz (90.8 kg)   LMP 07/10/2013   BMI 35.46 kg/m  General:   Alert and oriented. No distress noted. Pleasant and cooperative.  Head:  Normocephalic and atraumatic. Eyes:  Conjuctiva clear without scleral icterus. Mouth:  Oral mucosa pink and moist.  Abdomen:  +BS, soft, non-tender and non-distended. No rebound or guarding. No HSM or masses noted. Msk:  Symmetrical without gross deformities. Normal posture. Extremities:  Without edema. Neurologic:  Alert and  oriented x4 Psych:  Alert and cooperative. Normal mood and affect.

## 2018-09-05 ENCOUNTER — Encounter: Payer: Self-pay | Admitting: *Deleted

## 2018-09-05 ENCOUNTER — Ambulatory Visit (INDEPENDENT_AMBULATORY_CARE_PROVIDER_SITE_OTHER): Payer: Medicare Other | Admitting: Gastroenterology

## 2018-09-05 ENCOUNTER — Other Ambulatory Visit: Payer: Self-pay

## 2018-09-05 ENCOUNTER — Encounter: Payer: Self-pay | Admitting: Gastroenterology

## 2018-09-05 VITALS — BP 154/89 | HR 104 | Temp 96.9°F | Ht 63.0 in | Wt 200.2 lb

## 2018-09-05 DIAGNOSIS — R1013 Epigastric pain: Secondary | ICD-10-CM

## 2018-09-05 DIAGNOSIS — K5900 Constipation, unspecified: Secondary | ICD-10-CM

## 2018-09-05 DIAGNOSIS — G8929 Other chronic pain: Secondary | ICD-10-CM

## 2018-09-05 NOTE — Assessment & Plan Note (Signed)
Start Amitiza 8 mcg po BID with food. Samples provided. 4-6 months follow-up.

## 2018-09-05 NOTE — Patient Instructions (Signed)
I have ordered an upper GI to assess your anatomy.  For constipation: start Amitiza 1 gelcap WITH FOOD twice a day (take with food to avoid nausea). Let me know how this works for you!  We will see you in 4-6 months!  I enjoyed seeing you again today! As you know, I value our relationship and want to provide genuine, compassionate, and quality care. I welcome your feedback. If you receive a survey regarding your visit,  I greatly appreciate you taking time to fill this out. See you next time!  Annitta Needs, PhD, ANP-BC [facility]

## 2018-09-05 NOTE — Progress Notes (Signed)
cc'ed to pcp °

## 2018-09-05 NOTE — Assessment & Plan Note (Signed)
58 year old female with history of gastric bypass in 2014, presenting with persistent early satiety and unable to progress foods past soft textures, sensation of heaviness in upper abdomen and discomfort/burning, intermittent nausea and needing to vomit. Recently underwent EGD with LA Grade A esophagitis s/p dilatation. Staple was protruding at anastomosis per procedure note. Although GERD symptoms are improved on Protonix, she is still unable to advance diet. I would like to pursue UGI to see if there is any narrowing of the anastomosis, motility disorder, etc. Will refer her back to [facility] as well after review of UGI.

## 2018-09-10 ENCOUNTER — Ambulatory Visit (HOSPITAL_COMMUNITY)
Admission: RE | Admit: 2018-09-10 | Discharge: 2018-09-10 | Disposition: A | Discharge status: Home / Self Care | Payer: Medicare Other | Source: Ambulatory Visit | Attending: Gastroenterology | Admitting: Gastroenterology

## 2018-09-10 ENCOUNTER — Other Ambulatory Visit: Payer: Self-pay

## 2018-09-10 DIAGNOSIS — R1013 Epigastric pain: Secondary | ICD-10-CM | POA: Insufficient documentation

## 2018-09-10 DIAGNOSIS — K449 Diaphragmatic hernia without obstruction or gangrene: Secondary | ICD-10-CM | POA: Diagnosis not present

## 2018-09-10 DIAGNOSIS — G8929 Other chronic pain: Secondary | ICD-10-CM | POA: Insufficient documentation

## 2018-09-11 ENCOUNTER — Telehealth: Payer: Self-pay | Admitting: Gastroenterology

## 2018-09-11 DIAGNOSIS — R1013 Epigastric pain: Secondary | ICD-10-CM

## 2018-09-11 DIAGNOSIS — G8929 Other chronic pain: Secondary | ICD-10-CM

## 2018-09-11 DIAGNOSIS — R1310 Dysphagia, unspecified: Secondary | ICD-10-CM

## 2018-09-11 NOTE — Telephone Encounter (Signed)
Referral sent to [facility] via [facility]. 

## 2018-09-11 NOTE — Telephone Encounter (Signed)
[facility] clinical pool: please refer patient back to [facility], (surgeon in 2014 was Dr. Excell Seltzer) for follow-up. History of gastric bypass. Difficulty advancing diet from soft foods.

## 2018-09-11 NOTE — Progress Notes (Signed)
Carrie Cook: your upper GI shows a patent anastomosis (where the stomach and intestine are attached), and you have a small hiatal hernia. No obvious stricture at the anastomosis. I would like to get you back to see Dr. Excell Seltzer and am sending him a message in our medical record system. I will have our office get a referral going to him! (sent in [place]).

## 2018-09-11 NOTE — Addendum Note (Signed)
Addended by: Zara Council C on: 09/11/2018 12:54 PM   Modules accepted: Orders

## 2018-10-16 DIAGNOSIS — F339 Major depressive disorder, recurrent, unspecified: Secondary | ICD-10-CM | POA: Diagnosis not present

## 2018-10-24 DIAGNOSIS — Z9884 Bariatric surgery status: Secondary | ICD-10-CM | POA: Diagnosis not present

## 2018-10-31 ENCOUNTER — Other Ambulatory Visit: Payer: Self-pay | Admitting: Family Medicine

## 2018-11-26 ENCOUNTER — Ambulatory Visit: Payer: Medicare Other | Admitting: Dietician

## 2018-11-28 ENCOUNTER — Other Ambulatory Visit: Payer: Self-pay | Admitting: Internal Medicine

## 2018-12-11 DIAGNOSIS — F339 Major depressive disorder, recurrent, unspecified: Secondary | ICD-10-CM | POA: Diagnosis not present

## 2019-01-08 ENCOUNTER — Ambulatory Visit (INDEPENDENT_AMBULATORY_CARE_PROVIDER_SITE_OTHER): Payer: Medicare Other | Admitting: Gastroenterology

## 2019-01-08 ENCOUNTER — Encounter: Payer: Self-pay | Admitting: Gastroenterology

## 2019-01-08 ENCOUNTER — Other Ambulatory Visit: Payer: Self-pay

## 2019-01-08 DIAGNOSIS — G8929 Other chronic pain: Secondary | ICD-10-CM | POA: Diagnosis not present

## 2019-01-08 DIAGNOSIS — R1013 Epigastric pain: Secondary | ICD-10-CM | POA: Diagnosis not present

## 2019-01-08 DIAGNOSIS — K2100 Gastro-esophageal reflux disease with esophagitis, without bleeding: Secondary | ICD-10-CM

## 2019-01-08 NOTE — Patient Instructions (Signed)
Continue Protonix daily.  I am glad you will be seeing the Nutritionist!  We will see you back in 6 months or sooner if needed!  Have a wonderful holiday season!  I enjoyed seeing you again today! As you know, I value our relationship and want to provide genuine, compassionate, and quality care. I welcome your feedback. If you receive a survey regarding your visit,  I greatly appreciate you taking time to fill this out. See you next time!  Annitta Needs, PhD, ANP-BC [facility]

## 2019-01-08 NOTE — Progress Notes (Signed)
Primary Care Physician:  Kathyrn Drown, MD  Primary GI: Dr. Gala Romney   Patient Location: Home   Provider Location: [facility] office   Reason for Visit: Follow-up    Persons present on the virtual encounter, with roles: NP and patient   Total time (minutes) spent on medical discussion: 15 minutes   Due to COVID-19, visit was conducted using virtual method.  Visit was requested by patient.  Virtual Visit via Video Note Due to COVID-19, visit is conducted virtually and was requested by patient.   I connected with Carrie Cook on 01/08/19 at  9:30 AM EST by telephone and verified that I am speaking with the correct person using two identifiers.   I discussed the limitations, risks, security and privacy concerns of performing an evaluation and management service by telephone and the availability of in person appointments. I also discussed with the patient that there may be a patient responsible charge related to this service. The patient expressed understanding and agreed to proceed.  Chief Complaint  Patient presents with  . Abdominal Pain    epigastric, unchanged  . Nausea    with vomiting "some"  . diarrhea/constipation     History of Present Illness: Carrie Cook is a 58 y.o. female presenting today with a history of gastric bypass in 2014, presenting in follow-up for epigastric pain, dysphagia, bloating, N/V present since time of gastric bypass. EGD May 2020 with LA Grade A esophagitis s/p dilation. Starting weight pre-op gastric bypass was 230-240. Lowest weight 173. Over time, started to regain. Repeat UG with small gastric reservoir, with small amount identified as small hiatal hernia unchanged since 2014. Patent GJ anastomosis without obstruction. She was referred to [facility] for further evaluation and felt that most of her symptoms related to psychosocial issues.     Constipation: resolved. Loose and mushy at times. Doesn't come out firm.  Believes stress and diet-related.  Sees nutritionist right before New Year. Doesn't feel like she needs Amitiza.   Abdominal pain: still with bloating at times. At baseline. Chronic.   GERD: Protonix daily. GERD is better. Improved from baseline.   Nausea: still a few times a week. Wonders if related to eating. Chronic migraines.    Goes back in a couple months to [place].   Scheduled to see orthopedic surgeon, Dr. Aline Brochure in a few weeks. Has needed knee replacement.   Past Medical History:  Diagnosis Date  . Anemia    PT HAS HAD COLONOSCOPY AND ENDOSCOPY WORK UP - NO PROBLEMS FOUND AS SOURCE OF ANEMIA -- PT HAD IRON INFUSION AT [facility] 11/13/12-AFTER SEEING HEMATOLOGIST DR. [place] AND HE GAVE HEMATOLOGIC CLEARANCE FOR GASTRIC BYPASS SURGERY.  Marland Kitchen Anxiety   . Asthma    daily and prn inhalers  . Degenerative joint disease    right knee, spine - STATES INTERMITTENT  NUMBNESS DOWN RT LEG WITH PROLONGED STANDING OR WALKING- THINKS RELATED TO HER SPINE PROBLEMS  . Dental crowns present   . Depression   . Family history of anesthesia complication    pt's mother and sister have hx. of post-op N/V  . Fibroids    UTERINE  . Gastroesophageal reflux disease    none for 2 years since Bariatric surgery.  . H/O hiatal hernia   . History of endometriosis   . History of kidney stones   . Hyperlipidemia   . Hypertension    under control with med., has been on med. x 2 yr.  . Insulin  dependent diabetes mellitus   . Lock jaw    jaw locks open if opens mouth wide  . Migraines   . Neuropathy    FEET  . Obesity   . Palpitations   . PONV (postoperative nausea and vomiting)   . Shortness of breath    with daily activities  . Sleep apnea    post-op didn't need CPAP but now states needs it again but not using     Past Surgical History:  Procedure Laterality Date  . ABDOMINAL HYSTERECTOMY N/A 06/06/2017   Procedure: HYSTERECTOMY ABDOMINAL;  Surgeon: Florian Buff, MD;  Location: [facility];  Service: Gynecology;  Laterality:  N/A;  . BREATH TEK H PYLORI N/A 08/13/2012   Procedure: BREATH TEK H PYLORI;  Surgeon: Edward Jolly, MD;  Location: [facility];  Service: General;  Laterality: N/A;  . CARPAL TUNNEL RELEASE  01/03/2012   Procedure: CARPAL TUNNEL RELEASE;  Surgeon: Wynonia Sours, MD;  Location: [place];  Service: Orthopedics;  Laterality: Right;  . carpal tunnel release right hand Right 12/13  . COLONOSCOPY  05/2009   [id number] hemorrhoids otherwise normal colon, rectum and terminal ileum  . COLONOSCOPY WITH ESOPHAGOGASTRODUODENOSCOPY (EGD) N/A 10/28/2012   Procedure: COLONOSCOPY WITH ESOPHAGOGASTRODUODENOSCOPY (EGD);  Surgeon: Daneil Dolin, MD;  [facility];  Service: Endoscopy;  Laterality: N/A;  7:30  . DILITATION & CURRETTAGE/HYSTROSCOPY WITH NOVASURE ABLATION N/A 07/22/2014   Procedure: DILATATION & CURETTAGE/HYSTEROSCOPY WITH NOVASURE ABLATION; uterine length 6.0 cm; uterine width 3.8 cm; total ablation time 1 minute 15 seconds;  Surgeon: Florian Buff, MD;  Location: [facility];  Service: Gynecology;  Laterality: N/A;  . ESOPHAGOGASTRODUODENOSCOPY  5/013/2011   [id number] esophagus/multiple polyps removed s/p (hyperplastic). Gastritis without H.pylori.  . ESOPHAGOGASTRODUODENOSCOPY (EGD) WITH PROPOFOL N/A 06/06/2018   with LA Grade A esophagitis s/p dilation.   Marland Kitchen GASTRIC ROUX-EN-Y N/A 11/19/2012   Procedure: LAPAROSCOPIC ROUX-EN-Y GASTRIC;  Surgeon: Edward Jolly, MD;  Location: [facility];  Service: General;  Laterality: N/A;  . GIVENS CAPSULE STUDY N/A 10/28/2012   Procedure: GIVENS CAPSULE STUDY;  Surgeon: Daneil Dolin, MD;  [facility];  Service: Endoscopy;  Laterality: N/A;  . KNEE ARTHROSCOPY WITH MEDIAL MENISECTOMY Right 10/28/2015   Procedure: RIGHT KNEE ARTHROSCOPY WITH MEDIAL MENISECTOMY;  Surgeon: Carole Civil, MD;  Location: [facility];  Service: Orthopedics;  Laterality: Right;  . LAPAROSCOPY     due to endometriosis  . MALONEY DILATION N/A  06/06/2018   Procedure: Venia Minks DILATION;  Surgeon: Daneil Dolin, MD;  [facility];  Service: Endoscopy;  Laterality: N/A;  . PELVIC LAPAROSCOPY  11/04/1999   with fulguration of endometriosis  . SALPINGOOPHORECTOMY Bilateral 06/06/2017   Procedure: SALPINGO OOPHORECTOMY;  Surgeon: Florian Buff, MD;  Location: [facility];  Service: Gynecology;  Laterality: Bilateral;  . TRIGGER FINGER RELEASE Right 09/24/2012   Procedure: RELEASE A-1 PULLEY OF RIGHT THUMB;  Surgeon: Wynonia Sours, MD;  Location: [place];  Service: Orthopedics;  Laterality: Right;  . TUBAL LIGATION  1993     Current Meds  Medication Sig  . albuterol (PROVENTIL HFA;VENTOLIN HFA) 108 (90 Base) MCG/ACT inhaler Inhale 2 puffs into the lungs every 6 (six) hours as needed for wheezing.  . ARIPiprazole (ABILIFY) 2 MG tablet Take 1 tablet by mouth daily.  . cetirizine (ZYRTEC) 10 MG tablet Take 10 mg by mouth daily.  . Cyanocobalamin (VITAMIN B-12) 1000 MCG SUBL Place 1,000 mcg  under the tongue daily.  Marland Kitchen estradiol (ESTRACE) 2 MG tablet Take 1 tablet (2 mg total) by mouth at bedtime.  . fluticasone (FLOVENT HFA) 220 MCG/ACT inhaler Inhale 2 puffs into the lungs 2 (two) times daily. (Patient taking differently: Inhale 2 puffs into the lungs 2 (two) times daily as needed (shortness of breath). )  . Insulin Glargine (LANTUS SOLOSTAR) 100 UNIT/ML Solostar Pen Use 24 units qhs. May titrate to 30 units qhs (Patient taking differently: Inject 24 Units into the skin at bedtime. May titrate to 30 units qhs)  . Insulin Pen Needle (PEN NEEDLES) 32G X 4 MM MISC 32 g by Does not apply route daily.  Marland Kitchen lamoTRIgine (LAMICTAL) 100 MG tablet Take 150 mg by mouth 2 (two) times daily.   Marland Kitchen losartan (COZAAR) 100 MG tablet TAKE 1 TABLET BY MOUTH DAILY  . Melatonin 10 MG SUBL Place 10 mg under the tongue at bedtime.   . metFORMIN (GLUCOPHAGE) 500 MG tablet TAKE 1 TABLET(500 MG) BY MOUTH TWICE DAILY WITH A MEAL  . Multiple Vitamin  (MULTIVITAMIN WITH MINERALS) TABS tablet Take 1 tablet by mouth daily.   . pantoprazole (PROTONIX) 40 MG tablet TAKE 1 TABLET BY MOUTH EVERY DAY  . venlafaxine (EFFEXOR) 100 MG tablet Take 100 mg by mouth 2 (two) times daily with a meal.      Family History  Problem Relation Age of Onset  . Hypertension Father   . Hyperlipidemia Father   . COPD Father   . COPD Mother   . Heart disease Mother   . Cancer Mother        Cervix  . Anesthesia problems Mother        post-op N/V  . Thyroid disease Mother   . Diabetes Maternal Grandfather   . Thyroid disease Sister   . Anesthesia problems Sister        post-op N/V  . Thyroid disease Paternal Uncle   . Diabetes Other   . Colon cancer Neg Hx   . Colon polyps Neg Hx     Social History   Socioeconomic History  . Marital status: Married    Spouse name: Not on file  . Number of children: 1  . Years of education: Not on file  . Highest education level: Not on file  Occupational History  . Occupation: disability  Tobacco Use  . Smoking status: Never Smoker  . Smokeless tobacco: Never Used  Substance and Sexual Activity  . Alcohol use: No  . Drug use: No  . Sexual activity: Not Currently    Birth control/protection: Surgical  Other Topics Concern  . Not on file  Social History Narrative  . Not on file   Social Determinants of Health   Financial Resource Strain:   . Difficulty of Paying Living Expenses: Not on file  Food Insecurity:   . Worried About Charity fundraiser in the Last Year: Not on file  . Ran Out of Food in the Last Year: Not on file  Transportation Needs:   . Lack of Transportation (Medical): Not on file  . Lack of Transportation (Non-Medical): Not on file  Physical Activity:   . Days of Exercise per Week: Not on file  . Minutes of Exercise per Session: Not on file  Stress:   . Feeling of Stress : Not on file  Social Connections:   . Frequency of Communication with Friends and Family: Not on file  .  Frequency of Social Gatherings with Friends and  Family: Not on file  . Attends Religious Services: Not on file  . Active Member of Clubs or Organizations: Not on file  . Attends Archivist Meetings: Not on file  . Marital Status: Not on file       Review of Systems: Gen: Denies fever, chills, anorexia. Denies fatigue, weakness, weight loss.  CV: Denies chest pain, palpitations, syncope, peripheral edema, and claudication. Resp: Denies dyspnea at rest, cough, wheezing, coughing up blood, and pleurisy. GI: see HPI Derm: Denies rash, itching, dry skin Psych: Denies depression, anxiety, memory loss, confusion. No homicidal or suicidal ideation.  Heme: Denies bruising, bleeding, and enlarged lymph nodes.  Observations/Objective: No distress. Maintains eye contact. Well-dressed. Conversant and pleasant on video call.   Assessment and Plan: 58 year old female with history of gastric bypass in 2014, chronic epigastric pain, bloating, nausea, which has been present since bariatric surgery. Thorough evaluation here with EGD on file earlier this year noting esophagitis, UGI with patent anastomosis, and evaluation again by [facility] due to history of bariatric surgery. Symptoms worsen with stress and are most related to psychosocial etiology. No CT on file, but she does not have any alarm signs/symptoms and presentation not typical of internal hernia.   GERD: Controlled with PPI daily  Constipation: alternating with loose stool and dietary-related. Keep appt with nutritionist.   Abdominal pain: at baseline and no change. No alarm features.   Return in 6 months or earlier if needed.   Follow Up Instructions: See AVS   I discussed the assessment and treatment plan with the patient. The patient was provided an opportunity to ask questions and all were answered. The patient agreed with the plan and demonstrated an understanding of the instructions.   The patient was advised to call back or  seek an in-person evaluation if the symptoms worsen or if the condition fails to improve as anticipated.  I provided 15 minutes of non-face-to-face time during this encounter.  Annitta Needs, PhD, ANP-BC [facility]

## 2019-01-13 ENCOUNTER — Encounter: Payer: Self-pay | Admitting: Orthopedic Surgery

## 2019-01-13 ENCOUNTER — Ambulatory Visit: Payer: Medicare Other

## 2019-01-13 ENCOUNTER — Ambulatory Visit (INDEPENDENT_AMBULATORY_CARE_PROVIDER_SITE_OTHER): Payer: Medicare Other | Admitting: Orthopedic Surgery

## 2019-01-13 ENCOUNTER — Other Ambulatory Visit: Payer: Self-pay

## 2019-01-13 VITALS — BP 158/95 | HR 110 | Ht 63.0 in | Wt 202.0 lb

## 2019-01-13 DIAGNOSIS — G8929 Other chronic pain: Secondary | ICD-10-CM

## 2019-01-13 DIAGNOSIS — M25561 Pain in right knee: Secondary | ICD-10-CM

## 2019-01-13 NOTE — Patient Instructions (Addendum)
You have decided to proceed with knee replacement surgery. You have decided not to continue with nonoperative measures such as but not limited to oral medication, weight loss, activity modification, physical therapy, bracing, or injection.  We will perform the procedure commonly known as total knee replacement. Some of the risks associated with knee replacement surgery include but are not limited to Bleeding Infection Swelling Stiffness Blood clot Pulmonary embolism  Loosening of the implant Pain that persists even after surgery  Infection is especially devastating complication of knee surgery although rare. If infection does occur your implant will usually have to be removed and several surgeries and antibiotics will be needed to eradicate the infection prior to performing a repeat replacement.   In some cases amputation is required to eradicate the infection. In other rare cases a knee fusion is needed   In compliance with recent [place] law in federal regulation regarding opioid use and abuse and addiction, we will taper (stop) opioid medication after 2 weeks.  If you're not comfortable with these risks and would like to continue with nonoperative treatment please let Dr. Aline Brochure know prior to your surgery.   Preparing for Knee Replacement Getting prepared before knee replacement surgery can make your recovery easier and more comfortable. This document provides some tips and guidelines that will help you prepare for your surgery. Talk with your health care provider so you can learn what to expect before, during, and after surgery. Ask questions if you do not understand something. To ease concerns about your financial responsibilities, call your insurance company as soon as you decide to have surgery. Ask how much of your surgery and hospital stay will be covered. Also ask about coverage for medical equipment, rehabilitation facilities, and home care. How should I arrange for  help? In the first couple weeks after surgery, it will likely be harder for you to do some of your regular activities. You may get tired easily, and you will have limited movement in your leg. Follow these guidelines to make sure you have all the help you need after your surgery:  Plan to have someone take you home from the hospital. Your health care provider will tell you how many days you can expect to be in the hospital.  Cancel all your work, caregiving, and volunteer responsibilities for at least 4-6 weeks after your surgery.  Plan to have someone stay with you day and night for the first week. This person should be someone you are comfortable with. You may need this person to help you with your exercises and personal care, such as bathing and using the toilet.  If you live alone, arrange for someone to take care of your home and pets for the first 4-6 weeks after surgery.  Arrange for drivers to take you to and from follow-up appointments, the grocery store, and other places you may need to go for at least 4-6 weeks.  Consider applying for a disabled parking permit. To get an application, contact the Department of Motor Vehicles or your health care provider's office. How should I prepare my home?      Pick a recovery spot, but do not plan on recovering in bed. Sitting upright is better for your health. You may want to use a recliner with a small table nearby. Place the items you use most frequently on that table. These may include the TV remote, a cordless phone, a cell phone, a book or laptop computer, and a water glass.  To see  if you will be able to move around in your home with a walker, hold your hands out about 6 inches (15 cm) from your sides, and walk from your recovery spot to your kitchen and bathroom. Then walk from your bed to the bathroom. If you do not hit anything with your hands, you will have enough room for a walker.  Minimize the use of stairs after you return home to  reduce your risk of falling or tripping.  Remove all clutter from your floors. Also remove any throw rugs. This will help you avoid tripping after your surgery.  Move the items you use most often in your kitchen, bathroom, and bedroom to shelves and drawers that are at countertop height.  Prepare a few meals to freeze and reheat later.  Consider getting safety equipment that will be helpful during your recovery, such as: ? Grab bars added in the shower and near the toilet. ? A raised toilet seat to help you get on and off the toilet more easily. ? A tub or shower bench. How should I prepare my body?  Have a preoperative exam. ? During the exam, your health care provider will make sure that your body is healthy enough to safely have the surgery. ? When you go to the exam, bring a complete list of all your medicines and supplements, including herbs and vitamins. ? You may need to have additional tests to ensure your safety.  Have elective dental care and routine cleanings done before your surgery. Germs from anywhere in your body, including your mouth, can travel to your new joint and infect it. It is important that you do not have any dental work done for at least 3 months after your surgery.  Maintain a healthy diet. Do not change your diet before surgery unless your health care provider tells you to do that.  Do not use any products that contain nicotine or tobacco, such as cigarettes and e-cigarettes. These can delay bone healing after surgery. If you need help quitting, ask your health care provider.  Tell your health care provider if: ? You develop any skin infections or skin irritations. You may need to improve the condition of your skin before surgery. ? You have a fever, a cold, or any other illness in the week before your surgery.  Do not drink any alcohol for at least 48 hours before surgery.  The day before your surgery, follow instructions from your health care provider  about showering, eating, drinking, and taking medicines. These directions are for your safety.  Talk to your health care provider about doing exercises before your surgery. ? Be sure to follow the exercise program only as directed by your health care provider. ? Doing these exercises in the weeks before your surgery may help reduce pain and improve function after surgery. Summary  Getting prepared before knee replacement surgery can make your recovery easier and more comfortable.  Prepare your home and arrange for help at home.  Keep all of your preoperative appointments to ensure that you are ready for your surgery.  Plan to have someone take you home from the hospital and stay with you day and night for the first week. This information is not intended to replace advice given to you by your health care provider. Make sure you discuss any questions you have with your health care provider. Document Released: 04/15/2010 Document Revised: 02/26/2017 Document Reviewed: 02/26/2017 Elsevier Patient Education  [employer].  Total Knee Replacement  Total knee replacement is a surgery to replace a bad knee joint with a man-made (prosthetic) joint. The man-made joint is called a prosthesis. This joint may be made of plastic, metal, or ceramic parts. It replaces parts of the thigh bone (femur), lower leg bone (tibia), and kneecap (patella). This is done to reduce pain and help you move better. Tell your doctor about:  Any allergies you have.  All medicines you are taking. This includes vitamins, herbs, eye drops, creams, and over-the-counter medicines.  Any problems you or family members have had with anesthetic medicines.  Any blood disorders you have.  Any surgeries you have had.  Any medical conditions you have.  Whether you are pregnant or may be pregnant. What are the risks? Generally, this is a safe procedure. However, problems may occur, such  as:  Infection.  Bleeding.  Blood clot.  Allergic reaction to medicines.  Damage to nerves and other parts of the knee.  Not being able to move your knee as much.  A feeling that the knee is weak or unstable.  Loosening of the new joint.  Pain that does not go away (chronic pain). What happens before the procedure? Staying hydrated Follow instructions from your doctor about hydration. These may include:  Up to 2 hours before the procedure - you may continue to drink clear liquids. These include water, clear fruit juice, black coffee, and plain tea.  Eating and drinking Follow instructions from your doctor about eating and drinking. These may include:  8 hours before the procedure - stop eating heavy meals or foods. These include meat, fried foods, or fatty foods.  6 hours before the procedure - stop eating light meals or foods. These include toast or cereal.  6 hours before the procedure - stop drinking milk or drinks that contain milk.  2 hours before the procedure - stop drinking clear liquids. Medicines Ask your doctor about:  Changing or stopping your normal medicines. This is important.  Taking aspirin and ibuprofen. Do not take these medicines unless your doctor tells you to take them.  Taking over-the-counter medicines, vitamins, herbs, and supplements. Tests and exams  You may have a physical exam.  You may have tests, such as: ? X-rays. ? MRI. ? CT scan. ? Bone scans.  You may have a blood or urine sample taken. Lifestyle   If your doctor tells you to do physical therapy, do exercises as told.  Keep your body and teeth clean. Germs from anywhere in your body can infect your new joint. Tell your doctor if: ? You plan to have dental care and routine cleanings. ? You get any skin infections.  If you are overweight, work with your doctor to reach a safe weight. Extra weight can affect your knee.  Do not use any products that contain nicotine or  tobacco, such as cigarettes, e-cigarettes, and chewing tobacco. These can delay healing. If you need help quitting, ask your doctor. General instructions  Plan to have someone take you home from the hospital or clinic.  If you will be going home right after the procedure, plan to have someone with you for at least 24 hours. It is best to have someone to help care for you for at least 4-6 weeks after surgery.  Do not shave your legs just before surgery. This will be done in the hospital if needed.  Ask your doctor: ? How your surgery site will be marked. ? What steps will be taken to help  prevent the spread of germs. These may include:  Removing hair at the surgery site.  Washing skin with a germ-killing soap. What happens during the procedure?  An IV tube will be put into one of your veins.  You will be given one or more of the following: ? A medicine to help you relax (sedative). ? A medicine to numb everything below the injection site (peripheral nerve block). ? A medicine that numbs your body below the waist (spinal anesthetic). ? A medicine to make you fall asleep (general anesthetic).  A cut (incision) will be made in your knee.  Damaged parts of your thigh bone, lower leg bone, and kneecap will be removed.  Parts of the new joint will be placed on your thigh bone, your lower leg bone, and the underside of your kneecap.  One or more small tubes (drains) may be placed near your cut. This will help to drain fluid.  Your cut will be closed. Your doctor will use stitches (sutures), skin glue, or skin tape (adhesive) strips.  A bandage (dressing) will be placed over your cut. The procedure may vary among doctors and hospitals. What happens after the procedure?  You will be monitored until you leave the hospital or clinic. Your blood pressure, heart rate, breathing rate, and blood oxygen level will be checked.  You will be given medicines for pain.  You may: ? Keep getting  fluids through an IV tube. ? Have fluid coming from the drain. ? Have to wear special socks (compression stockings). ? Be given a knee joint motion machine (continuous passive motion machine) to use at home.  You will be told to move around as much as you can.  Do not drive until your health care provider tells you it is okay. Summary  Total knee replacement is a surgery to replace a bad knee joint with a man-made joint.  Before the procedure, follow what you are told about eating, drinking, and taking medicines.  Plan to have someone take you home from the hospital or clinic. This information is not intended to replace advice given to you by your health care provider. Make sure you discuss any questions you have with your health care provider. Document Released: 04/03/2011 Document Revised: 08/23/2017 Document Reviewed: 08/23/2017 Elsevier Patient Education  2020 [employer].

## 2019-01-13 NOTE — Progress Notes (Signed)
Carrie Cook  01/13/2019  Body mass index is 35.78 kg/m.   HISTORY SECTION :  Chief Complaint  Patient presents with  . Knee Pain    right    Carrie Cook is 58 years old she had a previous knee arthroscopy back in 2017 and had grade 4 chondral changes of the medial femoral condyle and required a meniscectomy.  She presents now with 2 to 3-year history of progressively increasing knee pain difficulty climbing the steps and weightbearing discomfort.  She has been taking care of her sick relatives and has been doing a lot of walking and extra shopping for them and has noticed that the pain has worsened as well.  Pain is rated moderate to severe nonradiating primarily over the medial compartment of the knee and the medial patellofemoral joint  She has a history of asthma diabetes with the most recent hemoglobin A1c of 6.9 as well as hypertension and she is followed by Dr. Wolfgang Phoenix  BMI is 23  Her husband is healthy lives at home she has 7 steps to get in the house no steps when she is in.    Review of Systems  Respiratory: Negative for shortness of breath.   Cardiovascular: Negative for chest pain.  Neurological: Negative for sensory change.  All other systems reviewed and are negative.    has a past medical history of Anemia, Anxiety, Asthma, Degenerative joint disease, Dental crowns present, Depression, Family history of anesthesia complication, Fibroids, Gastroesophageal reflux disease, H/O hiatal hernia, History of endometriosis, History of kidney stones, Hyperlipidemia, Hypertension, Insulin dependent diabetes mellitus, Lock jaw, Migraines, Neuropathy, Obesity, Palpitations, PONV (postoperative nausea and vomiting), Shortness of breath, and Sleep apnea.   Past Surgical History:  Procedure Laterality Date  . ABDOMINAL HYSTERECTOMY N/A 06/06/2017   Procedure: HYSTERECTOMY ABDOMINAL;  Surgeon: Florian Buff, MD;  Location: [facility];  Service: Gynecology;  Laterality: N/A;  . BREATH  TEK H PYLORI N/A 08/13/2012   Procedure: BREATH TEK H PYLORI;  Surgeon: Edward Jolly, MD;  Location: [facility];  Service: General;  Laterality: N/A;  . CARPAL TUNNEL RELEASE  01/03/2012   Procedure: CARPAL TUNNEL RELEASE;  Surgeon: Wynonia Sours, MD;  Location: [facility];  Service: Orthopedics;  Laterality: Right;  . carpal tunnel release right hand Right 12/13  . COLONOSCOPY  05/2009   [id number] hemorrhoids otherwise normal colon, rectum and terminal ileum  . COLONOSCOPY WITH ESOPHAGOGASTRODUODENOSCOPY (EGD) N/A 10/28/2012   Procedure: COLONOSCOPY WITH ESOPHAGOGASTRODUODENOSCOPY (EGD);  Surgeon: Daneil Dolin, MD;  Location: [facility];  Service: Endoscopy;  Laterality: N/A;  7:30  . DILITATION & CURRETTAGE/HYSTROSCOPY WITH NOVASURE ABLATION N/A 07/22/2014   Procedure: DILATATION & CURETTAGE/HYSTEROSCOPY WITH NOVASURE ABLATION; uterine length 6.0 cm; uterine width 3.8 cm; total ablation time 1 minute 15 seconds;  Surgeon: Florian Buff, MD;  Location: [facility];  Service: Gynecology;  Laterality: N/A;  . ESOPHAGOGASTRODUODENOSCOPY  5/013/2011   [id number] esophagus/multiple polyps removed s/p (hyperplastic). Gastritis without H.pylori.  . ESOPHAGOGASTRODUODENOSCOPY (EGD) WITH PROPOFOL N/A 06/06/2018   with LA Grade A esophagitis s/p dilation.   Marland Kitchen GASTRIC ROUX-EN-Y N/A 11/19/2012   Procedure: LAPAROSCOPIC ROUX-EN-Y GASTRIC;  Surgeon: Edward Jolly, MD;  Location: [facility];  Service: General;  Laterality: N/A;  . GIVENS CAPSULE STUDY N/A 10/28/2012   Procedure: GIVENS CAPSULE STUDY;  Surgeon: Daneil Dolin, MD;  [facility];  Service: Endoscopy;  Laterality: N/A;  . KNEE ARTHROSCOPY WITH MEDIAL MENISECTOMY Right 10/28/2015   Procedure:  RIGHT KNEE ARTHROSCOPY WITH MEDIAL MENISECTOMY;  Surgeon: Carole Civil, MD;  Location: [facility];  Service: Orthopedics;  Laterality: Right;  . LAPAROSCOPY     due to endometriosis  . MALONEY DILATION N/A 06/06/2018    Procedure: Venia Minks DILATION;  Surgeon: Daneil Dolin, MD;  Location: [facility];  Service: Endoscopy;  Laterality: N/A;  . PELVIC LAPAROSCOPY  11/04/1999   with fulguration of endometriosis  . SALPINGOOPHORECTOMY Bilateral 06/06/2017   Procedure: SALPINGO OOPHORECTOMY;  Surgeon: Florian Buff, MD;  Location: [facility];  Service: Gynecology;  Laterality: Bilateral;  . TRIGGER FINGER RELEASE Right 09/24/2012   Procedure: RELEASE A-1 PULLEY OF RIGHT THUMB;  Surgeon: Wynonia Sours, MD;  Location: [facility];  Service: Orthopedics;  Laterality: Right;  . TUBAL LIGATION  1993    Body mass index is 35.78 kg/m.   Allergies  Allergen Reactions  . Qvar [Beclomethasone] Shortness Of Breath    Patient states that this medication causes her to feel short of breath  . Codeine Nausea And Vomiting  . Prozac [Fluoxetine Hcl] Hives  . Lipitor [Atorvastatin Calcium] Other (See Comments)    Body aches, flu-like symptoms  . Dyazide [Hydrochlorothiazide W-Triamterene] Cough  . Erythromycin Nausea Only  . Lisinopril Cough  . Pravastatin Other (See Comments)    BODY ACHES, FLU-LIKE SX.  . Sulfonamide Derivatives Hives and Rash     Current Outpatient Medications:  .  albuterol (PROVENTIL HFA;VENTOLIN HFA) 108 (90 Base) MCG/ACT inhaler, Inhale 2 puffs into the lungs every 6 (six) hours as needed for wheezing., Disp: 1 Inhaler, Rfl: 6 .  ARIPiprazole (ABILIFY) 2 MG tablet, one po bid, Disp: , Rfl:  .  cetirizine (ZYRTEC) 10 MG tablet, Take 10 mg by mouth daily., Disp: , Rfl:  .  Cyanocobalamin (VITAMIN B-12) 1000 MCG SUBL, Place 1,000 mcg under the tongue daily., Disp: , Rfl:  .  estradiol (ESTRACE) 2 MG tablet, Take 1 tablet (2 mg total) by mouth at bedtime., Disp: 90 tablet, Rfl: 3 .  fluticasone (FLOVENT HFA) 220 MCG/ACT inhaler, Inhale 2 puffs into the lungs 2 (two) times daily. (Patient taking differently: Inhale 2 puffs into the lungs 2 (two) times daily as needed (shortness of breath).  ), Disp: 1 Inhaler, Rfl: 12 .  Insulin Glargine (LANTUS SOLOSTAR) 100 UNIT/ML Solostar Pen, Use 24 units qhs. May titrate to 30 units qhs (Patient taking differently: Inject 24 Units into the skin at bedtime. May titrate to 30 units qhs), Disp: 5 pen, Rfl: 5 .  Insulin Pen Needle (PEN NEEDLES) 32G X 4 MM MISC, 32 g by Does not apply route daily., Disp: 90 each, Rfl: 5 .  lamoTRIgine (LAMICTAL) 100 MG tablet, Take 150 mg by mouth 2 (two) times daily. , Disp: , Rfl: 3 .  losartan (COZAAR) 100 MG tablet, TAKE 1 TABLET BY MOUTH DAILY, Disp: 90 tablet, Rfl: 1 .  Melatonin 10 MG SUBL, Place 10 mg under the tongue at bedtime. , Disp: , Rfl:  .  metFORMIN (GLUCOPHAGE) 500 MG tablet, TAKE 1 TABLET(500 MG) BY MOUTH TWICE DAILY WITH A MEAL, Disp: 180 tablet, Rfl: 0 .  Multiple Vitamin (MULTIVITAMIN WITH MINERALS) TABS tablet, Take 1 tablet by mouth daily. , Disp: , Rfl:  .  pantoprazole (PROTONIX) 40 MG tablet, TAKE 1 TABLET BY MOUTH EVERY DAY, Disp: 90 tablet, Rfl: 3 .  venlafaxine (EFFEXOR) 100 MG tablet, Take 100 mg by mouth 2 (two) times daily with a meal. , Disp: ,  Rfl: 1   PHYSICAL EXAM SECTION: 1) BP (!) 158/95   Pulse (!) 110   Ht 5\' 3"  (1.6 m)   Wt 202 lb (91.6 kg)   LMP 07/10/2013   BMI 35.78 kg/m   Body mass index is 35.78 kg/m. General appearance: Well-developed well-nourished no gross deformities  2) Cardiovascular normal pulse and perfusion in the lower extremities normal color without edema  3) Neurologically deep tendon reflexes are equal and normal, no sensation loss or deficits no pathologic reflexes  4) Psychological: Awake alert and oriented x3 mood and affect normal  5) Skin no lacerations or ulcerations no nodularity no palpable masses, no erythema or nodularity  6) Musculoskeletal:   Left knee is noted to be in full extension with full range of motion no tenderness or instability muscle tone and strength are normal skin is clean dry and intact without erythema  Right  knee does not quite make full extension has excellent flexion of 130 degrees stable in all planes tender over the medial joint line and peripatellar structures medially with stable ligaments normal strength skin is warm dry tach without erythema   MEDICAL DECISION SECTION:  Encounter Diagnosis  Name Primary?  . Chronic pain of right knee Yes    Imaging See dictated report x-rays show varus knee with medial compartment narrowing: Grade 3 of knee  Plan:  (Rx., Inj., surg., Frx, MRI/CT, XR:2)  CONSENT ELEMENTS  1. Clinical issues (DX) primary osteoarthritis right knee 2. Options surgical versus continued nonoperative treatment 3. Pros and cons improved function and decreased pain risks associated with knee replacement 4. Uncertainty with outcomes not applicable 5. Assess px understanding yes 6. Discern/assess patient preference knee replacement surgery  Risks and benefits and complications explained including alternative treatments. The patient has opted for surgical treatment   We discussed with Ms. Apollo are scheduling issues related to Covid and bed placement.  We do expect 23-hour admission/discharge.  Right total knee  10:12 AM Arther Abbott, MD  01/13/2019

## 2019-01-15 DIAGNOSIS — F339 Major depressive disorder, recurrent, unspecified: Secondary | ICD-10-CM | POA: Diagnosis not present

## 2019-01-21 ENCOUNTER — Other Ambulatory Visit: Payer: Self-pay

## 2019-01-21 ENCOUNTER — Encounter: Payer: Medicare Other | Attending: Surgery | Admitting: Skilled Nursing Facility1

## 2019-01-21 DIAGNOSIS — Z9884 Bariatric surgery status: Secondary | ICD-10-CM | POA: Diagnosis not present

## 2019-01-21 DIAGNOSIS — E669 Obesity, unspecified: Secondary | ICD-10-CM

## 2019-01-21 NOTE — Patient Instructions (Addendum)
-  Pull out the C-PAP and wear it every night   -Take the appropriate multivitamin and 3 calcium citrate a day   -Think about Creating balanced meals/preplanning and having a lunch box on you  -Check your blood sugar 2 times a week: once in the morning before you eat anything and once 2 hours after a meal

## 2019-01-21 NOTE — Progress Notes (Signed)
Bariatric Nutrition Follow-Up Visit Medical Nutrition Therapy   NUTRITION ASSESSMENT   Anthropometrics   Body Composition Scale Date  Weight  lbs 205.3  Total Body Fat  % 42.1     Visceral Fat 14  Fat-Free Mass  % 57.8     Total Body Water  % 43.4     Muscle-Mass  lbs 29.2  BMI 36  Body Fat Displacement ---        Torso  lbs 53.5        Left Leg  lbs 10.7        Right Leg  lbs 10.7        Left Arm  lbs 5.3        Right Arm  lbs 5.3   Clinical  Medical hx: depression, anxiety Medications: insulin, metformin, protonix, vitamin b12 Labs: A1C 6.9   Lifestyle & Dietary Hx  Pt states she takes car of her grandchildren and parents. Pt states she has been waiting to have knee replacements.  Pt states she does not check her blood sugar levels.  Pt states she takes no care for herself and also struggles with money stating her husband is a TEFL teacher.  Pt states she does have a problem with low sugar spells.  Pt states her teeth are not in good condition so she cannot eat nuts and raw vegetables.  Pt states she has anxiety with low blood sugars with diabetes.  Pt states she does not sleep well at night due to not wearing her C-PAP and pains.  Pt states she has been chewing all of her medicine thinking she was not allowed to swallow pills: Dietitian advised she can take capsules if she feels they do not cause issue.  Pt states she has worked with multiple therapists and physicians all telling her the same thing which is she has to work on self care and draw boundaries with her family.   Estimated daily fluid intake: unknown oz Estimated daily protein intake: 60+ g Supplements: centrum Current average weekly physical activity: ADL's  24-Hr Dietary Recall: fast food a couple times a week First Meal: sugary cereal  Snack: crackers Second Meal: fast food Snack: cookies Third Meal: frozen meal or fast food Snack: cereal and other snacks Beverages: 4-5 a day coffee, 32 ounces  maybe water  Post-Op Goals/ Signs/ Symptoms Using straws: no Drinking while eating: no Chewing/swallowing difficulties: no Changes in vision: no Changes to mood/headaches: no Hair loss/changes to skin/nails: no Difficulty focusing/concentrating: no Sweating: no Dizziness/lightheadedness: no Palpitations: no Carbonated/caffeinated beverages: no N/V/D/C/Gas: no Abdominal pain: no Dumping syndrome: no    NUTRITION DIAGNOSIS  Overweight/obesity ([place]-3.3) related to past poor dietary habits and physical inactivity as evidenced by completed bariatric surgery and following dietary guidelines for continued weight loss and healthy nutrition status.     NUTRITION INTERVENTION Nutrition counseling (C-1) and education (E-2) to facilitate bariatric surgery goals, including: . The importance of consuming adequate calories as well as certain nutrients daily due to the body's need for essential vitamins, minerals, and fats . The importance of daily physical activity and to reach a goal of at least 150 minutes of moderate to vigorous physical activity weekly (or as directed by their physician) due to benefits such as increased musculature and improved lab values  Goals: -Pull out the C-PAP and wear it every night  -Take the appropriate multivitamin and 3 calcium citrate a day  -Think about Creating balanced meals/preplanning and having a lunch box on you -  Check your blood sugar 2 times a week: once in the morning before you eat anything and once 2 hours after a meal  Handouts Provided Include   Meal ideas  Learning Style & Readiness for Change Teaching method utilized: Visual & Auditory  Demonstrated degree of understanding via: Teach Back  Barriers to learning/adherence to lifestyle change: struggles with taking care of herself due to trouble with saying no to her family.   RD's Notes for Next Visit . Assess adherence to pt chosen goals   MONITORING & EVALUATION Dietary intake, weekly  physical activity, body weight,

## 2019-01-30 ENCOUNTER — Other Ambulatory Visit: Payer: Self-pay | Admitting: *Deleted

## 2019-01-30 ENCOUNTER — Other Ambulatory Visit: Payer: Self-pay | Admitting: Family Medicine

## 2019-01-30 MED ORDER — PEN NEEDLES 32G X 4 MM MISC: 32.0000 g | Freq: Every day | 0 refills | Status: DC

## 2019-01-30 NOTE — Telephone Encounter (Signed)
Please schedule follow up and then route back so we can send in refill

## 2019-01-30 NOTE — Telephone Encounter (Signed)
Scheduled 1/12  Also needs refill on Insulin Pen Needle (PEN NEEDLES) 32G X 4 MM MISC

## 2019-01-30 NOTE — Telephone Encounter (Signed)
May have 1 refill needs to do virtual follow-up visit within the next 60 days

## 2019-01-30 NOTE — Telephone Encounter (Signed)
Last med check up 07/22/18

## 2019-02-04 ENCOUNTER — Ambulatory Visit (INDEPENDENT_AMBULATORY_CARE_PROVIDER_SITE_OTHER): Payer: Medicare Other | Admitting: Family Medicine

## 2019-02-04 DIAGNOSIS — D509 Iron deficiency anemia, unspecified: Secondary | ICD-10-CM

## 2019-02-04 DIAGNOSIS — I10 Essential (primary) hypertension: Secondary | ICD-10-CM

## 2019-02-04 DIAGNOSIS — Z794 Long term (current) use of insulin: Secondary | ICD-10-CM

## 2019-02-04 DIAGNOSIS — E7849 Other hyperlipidemia: Secondary | ICD-10-CM | POA: Diagnosis not present

## 2019-02-04 DIAGNOSIS — E119 Type 2 diabetes mellitus without complications: Secondary | ICD-10-CM | POA: Diagnosis not present

## 2019-02-04 MED ORDER — AMLODIPINE BESYLATE 2.5 MG PO TABS: ORAL_TABLET | ORAL | 6 refills | Status: DC

## 2019-02-04 MED ORDER — ROSUVASTATIN CALCIUM 5 MG PO TABS: ORAL_TABLET | ORAL | 6 refills | Status: DC

## 2019-02-04 NOTE — Progress Notes (Signed)
Subjective:    Patient ID: Carrie Cook, female    DOB: 1960/06/17, 59 y.o.   MRN: [id number]  Diabetes She presents for her follow-up diabetic visit. She has type 2 diabetes mellitus. Hypoglycemia symptoms include dizziness and headaches. Pertinent negatives for hypoglycemia include no confusion. (Pt states her BP has been high at other provider appts) Pertinent negatives for diabetes include no chest pain, no fatigue, no polydipsia, no polyphagia and no weakness. She has had a previous visit with a dietitian. She does not see a podiatrist.Eye exam is not current.  Hypertension This is a chronic problem. Associated symptoms include headaches. Pertinent negatives include no chest pain or shortness of breath. There are no compliance problems.   AiVirtual Visit via Telephone Note  I connected with Carrie Cook on 02/04/19 at  1:40 PM EST by telephone and verified that I am speaking with the correct person using two identifiers.  Location: Patient: home Provider: office   I discussed the limitations, risks, security and privacy concerns of performing an evaluation and management service by telephone and the availability of in person appointments. I also discussed with the patient that there may be a patient responsible charge related to this service. The patient expressed understanding and agreed to proceed.   History of Present Illness:    Observations/Objective:   Assessment and Plan:   Follow Up Instructions:    I discussed the assessment and treatment plan with the patient. The patient was provided an opportunity to ask questions and all were answered. The patient agreed with the plan and demonstrated an understanding of the instructions.   The patient was advised to call back or seek an in-person evaluation if the symptoms worsen or if the condition fails to improve as anticipated.  I provided 16 minutes of non-face-to-face time during this  encounter.       Review of Systems  Constitutional: Negative for activity change, appetite change and fatigue.  HENT: Negative for congestion and rhinorrhea.   Respiratory: Negative for cough and shortness of breath.   Cardiovascular: Negative for chest pain and leg swelling.  Gastrointestinal: Negative for abdominal pain and diarrhea.  Endocrine: Negative for polydipsia and polyphagia.  Skin: Negative for color change.  Neurological: Positive for dizziness and headaches. Negative for weakness.  Psychiatric/Behavioral: Negative for behavioral problems and confusion.       Objective:   Physical Exam  No acute distress virtual visit      Assessment & Plan:  1. Iron deficiency anemia, unspecified iron deficiency anemia type Previously when she had surgery her hemoglobin a little bit low we will recheck this again she has surgery potentially coming up for a knee replacement - Hemoglobin A1c - Lipid Profile - Hepatic function panel - Basic Metabolic Panel (BMET) - CBC with Differential - Ferritin  2. Essential hypertension Blood pressure she thinks is under good control hopefully her next visit will be in person continue current medicine - Hemoglobin A1c - Lipid Profile - Hepatic function panel - Basic Metabolic Panel (BMET) - CBC with Differential - Ferritin  3. Morbid obesity ([place]) Patient is trying to work hard to try and lose some weight trying to minimize her calories unable to do a lot of activity because of her osteoarthritis but she does do some walking and some activity - Hemoglobin A1c - Lipid Profile - Hepatic function panel - Basic Metabolic Panel (BMET) - CBC with Differential - Ferritin  4. Other hyperlipidemia Cholesterol issues very important for patient  to be on cholesterol medicine.  We will go ahead with low-dose cholesterol medicine if she cannot tolerate it she can let us know - Hemoglobin A1c - Lipid Profile - Hepatic function panel - Basic  Metabolic Panel (BMET) - CBC with Differential - Ferritin  5. Type 2 diabetes mellitus without complication, with long-term current use of insulin ([place]) Diabetes hopefully her A1c looks good she needs to do her blood work this was ordered continue her current medications follow-up if ongoing troubles - Hemoglobin A1c - Lipid Profile - Hepatic function panel - Basic Metabolic Panel (BMET) - CBC with Differential - Ferritin Next complete visit would be in 4 to 6 months hopefully here in person

## 2019-02-17 DIAGNOSIS — Z794 Long term (current) use of insulin: Secondary | ICD-10-CM | POA: Diagnosis not present

## 2019-02-17 DIAGNOSIS — E7849 Other hyperlipidemia: Secondary | ICD-10-CM | POA: Diagnosis not present

## 2019-02-17 DIAGNOSIS — E119 Type 2 diabetes mellitus without complications: Secondary | ICD-10-CM | POA: Diagnosis not present

## 2019-02-17 DIAGNOSIS — I10 Essential (primary) hypertension: Secondary | ICD-10-CM | POA: Diagnosis not present

## 2019-02-17 DIAGNOSIS — D509 Iron deficiency anemia, unspecified: Secondary | ICD-10-CM | POA: Diagnosis not present

## 2019-02-18 LAB — LIPID PANEL
Chol/HDL Ratio: 3.9 ratio (ref 0.0–4.4)
Cholesterol, Total: 177 mg/dL (ref 100–199)
HDL: 45 mg/dL (ref >39)
LDL Chol Calc (NIH): 92 mg/dL (ref 0–99)
Triglycerides: 237 mg/dL — ABNORMAL HIGH (ref 0–149)
VLDL Cholesterol Cal: 40 mg/dL (ref 5–40)

## 2019-02-18 LAB — HEPATIC FUNCTION PANEL
ALT: 12 IU/L (ref 0–32)
AST: 17 IU/L (ref 0–40)
Albumin: 4.1 g/dL (ref 3.8–4.9)
Alkaline Phosphatase: 148 IU/L — ABNORMAL HIGH (ref 39–117)
Bilirubin Total: 0.3 mg/dL (ref 0.0–1.2)
Bilirubin, Direct: 0.07 mg/dL (ref 0.00–0.40)
Total Protein: 6.3 g/dL (ref 6.0–8.5)

## 2019-02-18 LAB — CBC WITH DIFFERENTIAL/PLATELET
Basophils Absolute: 0.1 10*3/uL (ref 0.0–0.2)
Basos: 1 %
EOS (ABSOLUTE): 0.2 10*3/uL (ref 0.0–0.4)
Eos: 2 %
Hematocrit: 37.6 % (ref 34.0–46.6)
Hemoglobin: 12.2 g/dL (ref 11.1–15.9)
Immature Grans (Abs): 0 10*3/uL (ref 0.0–0.1)
Immature Granulocytes: 0 %
Lymphocytes Absolute: 2.1 10*3/uL (ref 0.7–3.1)
Lymphs: 24 %
MCH: 25.9 pg — ABNORMAL LOW (ref 26.6–33.0)
MCHC: 32.4 g/dL (ref 31.5–35.7)
MCV: 80 fL (ref 79–97)
Monocytes Absolute: 0.8 10*3/uL (ref 0.1–0.9)
Monocytes: 8 %
Neutrophils Absolute: 5.9 10*3/uL (ref 1.4–7.0)
Neutrophils: 65 %
Platelets: 339 10*3/uL (ref 150–450)
RBC: 4.71 x10E6/uL (ref 3.77–5.28)
RDW: 13.5 % (ref 11.7–15.4)
WBC: 9 10*3/uL (ref 3.4–10.8)

## 2019-02-18 LAB — BASIC METABOLIC PANEL
BUN/Creatinine Ratio: 16 (ref 9–23)
BUN: 10 mg/dL (ref 6–24)
CO2: 25 mmol/L (ref 20–29)
Calcium: 9 mg/dL (ref 8.7–10.2)
Chloride: 102 mmol/L (ref 96–106)
Creatinine, Ser: 0.63 mg/dL (ref 0.57–1.00)
GFR calc Af Amer: 116 mL/min/{1.73_m2} (ref >59)
GFR calc non Af Amer: 101 mL/min/{1.73_m2} (ref >59)
Glucose: 138 mg/dL — ABNORMAL HIGH (ref 65–99)
Potassium: 4.4 mmol/L (ref 3.5–5.2)
Sodium: 140 mmol/L (ref 134–144)

## 2019-02-18 LAB — HEMOGLOBIN A1C
Est. average glucose Bld gHb Est-mCnc: 192 mg/dL
Hgb A1c MFr Bld: 8.3 % — ABNORMAL HIGH (ref 4.8–5.6)

## 2019-02-18 LAB — FERRITIN: Ferritin: 9 ng/mL — ABNORMAL LOW (ref 15–150)

## 2019-02-24 ENCOUNTER — Ambulatory Visit: Payer: Medicare Other | Admitting: Skilled Nursing Facility1

## 2019-02-25 ENCOUNTER — Ambulatory Visit (INDEPENDENT_AMBULATORY_CARE_PROVIDER_SITE_OTHER): Payer: Medicare Other | Admitting: Family Medicine

## 2019-02-25 ENCOUNTER — Encounter: Payer: Self-pay | Admitting: Family Medicine

## 2019-02-25 ENCOUNTER — Other Ambulatory Visit: Payer: Self-pay

## 2019-02-25 VITALS — BP 138/82 | Temp 97.2°F | Wt 206.8 lb

## 2019-02-25 DIAGNOSIS — Z794 Long term (current) use of insulin: Secondary | ICD-10-CM

## 2019-02-25 DIAGNOSIS — D509 Iron deficiency anemia, unspecified: Secondary | ICD-10-CM

## 2019-02-25 DIAGNOSIS — E7849 Other hyperlipidemia: Secondary | ICD-10-CM | POA: Diagnosis not present

## 2019-02-25 DIAGNOSIS — G4730 Sleep apnea, unspecified: Secondary | ICD-10-CM | POA: Diagnosis not present

## 2019-02-25 DIAGNOSIS — K2100 Gastro-esophageal reflux disease with esophagitis, without bleeding: Secondary | ICD-10-CM | POA: Diagnosis not present

## 2019-02-25 DIAGNOSIS — Z1211 Encounter for screening for malignant neoplasm of colon: Secondary | ICD-10-CM

## 2019-02-25 DIAGNOSIS — I10 Essential (primary) hypertension: Secondary | ICD-10-CM

## 2019-02-25 DIAGNOSIS — E119 Type 2 diabetes mellitus without complications: Secondary | ICD-10-CM

## 2019-02-25 MED ORDER — ROSUVASTATIN CALCIUM 5 MG PO TABS: ORAL_TABLET | ORAL | 5 refills | Status: DC

## 2019-02-25 NOTE — Progress Notes (Signed)
Subjective:    Patient ID: Carrie Cook, female    DOB: 1960/03/06, 59 y.o.   MRN: [id number]  Hypertension This is a chronic problem. Pertinent negatives include no chest pain or shortness of breath. (Severe headache, chest pain, right side pain that occurs mostly at night) There are no compliance problems.   Pt taking Amlodipine 2.5 mg daily, Losartan 100 mg daily.  She denies any elevated blood pressure she states she is under a lot of stress she is trying to do better with her diet and has been seen dietitian recently She states her sugars have been okay but she has not been checking them frequently she states she will check them more often and send [place] some readings Patient denies numbness or tingling States her sleep apnea is doing better now that she is utilizing the machine on a regular basis.  She is trying to do a better job of this  Should patient be on Lipitor or Crestor? Pt is taking Crestor at this time.  Had discussion with patient she had some confusion regarding this I recommend Crestor 5 mg daily she is tolerating this currently Review of Systems  Constitutional: Negative for activity change, appetite change and fatigue.  HENT: Negative for congestion and rhinorrhea.   Respiratory: Negative for cough and shortness of breath.   Cardiovascular: Negative for chest pain and leg swelling.  Gastrointestinal: Negative for abdominal pain and diarrhea.  Endocrine: Negative for polydipsia and polyphagia.  Skin: Negative for color change.  Neurological: Negative for dizziness and weakness.  Psychiatric/Behavioral: Negative for behavioral problems and confusion.       Objective:   Physical Exam Vitals reviewed.  Constitutional:      General: She is not in acute distress. HENT:     Head: Normocephalic and atraumatic.  Eyes:     General:        Right eye: No discharge.        Left eye: No discharge.  Neck:     Trachea: No tracheal deviation.  Cardiovascular:     Rate  and Rhythm: Normal rate and regular rhythm.     Heart sounds: Normal heart sounds. No murmur.  Pulmonary:     Effort: Pulmonary effort is normal. No respiratory distress.     Breath sounds: Normal breath sounds.  Lymphadenopathy:     Cervical: No cervical adenopathy.  Skin:    General: Skin is warm and dry.  Neurological:     Mental Status: She is alert.     Coordination: Coordination normal.  Psychiatric:        Behavior: Behavior normal.    Diabetic foot exam normal       Assessment & Plan:  1. Essential hypertension Blood pressure good control continue current measures minimize salt in diet  2. Type 2 diabetes mellitus without complication, with long-term current use of insulin ([place]) Diabetes could be better.  Very important for patient to monitor her sugars closely send [place] some readings for review.  We did discuss the importance of healthy diet regular activity  3. Gastroesophageal reflux disease with esophagitis without hemorrhage Reflux under good control but she does have iron deficient anemia.  We will work toward getting her treated with iron infusions I will contact her gastroenterologist regarding this  4. Other hyperlipidemia Hyperlipidemia Crestor 5 mg every single day watch diet follow labs reviewed previous labs reviewed  5. Morbid obesity ([place]) Weight reduction recommended by watching diet staying physically active she has had  gastric bypass surgery but has gained back some weight Abilify may be playing a role in this  6. Sleep apnea, unspecified type Use her sleep apnea machine on a regular basis Patient was encouraged to keep up on her mammograms She was encouraged in female health checkups at least every 3 years 7. Screening for colon cancer Will check to make sure she does not have any occult blood - IFOBT POC (occult bld, rslt in office); Future  Iron deficient anemia-infusion recommended we will try to channel this through GI  Patient under a lot  of stress she is taking on too much responsibility with her grandchildren she needs to have her children step up and take care of the kids more often this will require her some internal fortitude to do this I recommended that she read a book called no boundaries by [employer]

## 2019-02-26 ENCOUNTER — Other Ambulatory Visit: Payer: Self-pay | Admitting: *Deleted

## 2019-02-26 DIAGNOSIS — D509 Iron deficiency anemia, unspecified: Secondary | ICD-10-CM

## 2019-02-26 NOTE — Progress Notes (Signed)
Referral put in.

## 2019-02-28 ENCOUNTER — Encounter: Payer: Self-pay | Admitting: Family Medicine

## 2019-03-04 ENCOUNTER — Other Ambulatory Visit: Payer: Self-pay

## 2019-03-04 ENCOUNTER — Encounter: Payer: Medicare Other | Attending: Surgery | Admitting: Skilled Nursing Facility1

## 2019-03-04 ENCOUNTER — Other Ambulatory Visit: Payer: Self-pay | Admitting: *Deleted

## 2019-03-04 DIAGNOSIS — Z882 Allergy status to sulfonamides status: Secondary | ICD-10-CM | POA: Diagnosis not present

## 2019-03-04 DIAGNOSIS — Z794 Long term (current) use of insulin: Secondary | ICD-10-CM | POA: Diagnosis not present

## 2019-03-04 DIAGNOSIS — M1711 Unilateral primary osteoarthritis, right knee: Secondary | ICD-10-CM | POA: Diagnosis not present

## 2019-03-04 DIAGNOSIS — Z79899 Other long term (current) drug therapy: Secondary | ICD-10-CM | POA: Insufficient documentation

## 2019-03-04 DIAGNOSIS — F329 Major depressive disorder, single episode, unspecified: Secondary | ICD-10-CM | POA: Insufficient documentation

## 2019-03-04 DIAGNOSIS — E119 Type 2 diabetes mellitus without complications: Secondary | ICD-10-CM | POA: Insufficient documentation

## 2019-03-04 DIAGNOSIS — Z713 Dietary counseling and surveillance: Secondary | ICD-10-CM | POA: Insufficient documentation

## 2019-03-04 DIAGNOSIS — I10 Essential (primary) hypertension: Secondary | ICD-10-CM | POA: Diagnosis not present

## 2019-03-04 DIAGNOSIS — Z888 Allergy status to other drugs, medicaments and biological substances status: Secondary | ICD-10-CM | POA: Insufficient documentation

## 2019-03-04 DIAGNOSIS — F419 Anxiety disorder, unspecified: Secondary | ICD-10-CM | POA: Diagnosis not present

## 2019-03-04 DIAGNOSIS — Z885 Allergy status to narcotic agent status: Secondary | ICD-10-CM | POA: Diagnosis not present

## 2019-03-04 DIAGNOSIS — Z9884 Bariatric surgery status: Secondary | ICD-10-CM | POA: Insufficient documentation

## 2019-03-04 DIAGNOSIS — J45909 Unspecified asthma, uncomplicated: Secondary | ICD-10-CM | POA: Diagnosis not present

## 2019-03-04 DIAGNOSIS — E669 Obesity, unspecified: Secondary | ICD-10-CM

## 2019-03-04 MED ORDER — LOSARTAN POTASSIUM 100 MG PO TABS: ORAL_TABLET | ORAL | 1 refills | Status: DC

## 2019-03-04 NOTE — Progress Notes (Signed)
Bariatric Nutrition Follow-Up Visit Medical Nutrition Therapy   NUTRITION ASSESSMENT   Anthropometrics   Body Composition Scale Date 03/04/2019  Weight  lbs 205.3 204.6  Total Body Fat  % 42.1 42.1     Visceral Fat 14 14  Fat-Free Mass  % 57.8 57.8     Total Body Water  % 43.4 43.4     Muscle-Mass  lbs 29.2 29.2  BMI 36 35.8  Body Fat Displacement ---         Torso  lbs 53.5 53.5        Left Leg  lbs 10.7 10.6        Right Leg  lbs 10.7 10.6        Left Arm  lbs 5.3 5.3        Right Arm  lbs 5.3 5.3   Clinical  Medical hx: depression, anxiety Medications: insulin, metformin, protonix, vitamin b12 Labs: A1C 6.9   Lifestyle & Dietary Hx  Pt states she cut out eating nutter butters by stopping buying them. Pt states she has been emotionally eating and not grocery shopped leading her to eat more carbs. Pt states she has not started checking her blood sugar citing stress as her barrier. Pt states she does not have time to check her sugar in the morning but she states she might do it later in the day. Pt states she recently discovered she is anemic so has to get an infusion. Pt states she did start wearing her C-PAP again. Pt states she did say no a couple times to her son for watching his 4 kids. Pt states she stresses herself about making sure her grandson stays in school. Pt state she is trying to limit fatty meats which is hard due to her husband eating fatty meats often.  Pt needs reminders she has done very well and not discourage herself.   Estimated daily fluid intake: unknown oz Estimated daily protein intake: 60+ g Supplements: centrum Current average weekly physical activity: ADL's  24-Hr Dietary Recall: fast food a couple times a week; almonds First Meal: sugary cereal  Snack: crackers Second Meal: fast food Snack: cookies Third Meal: frozen meal or fast food Snack: cereal and other snacks Beverages: 4-5 a day coffee, 32 ounces maybe water  Post-Op Goals/ Signs/  Symptoms Using straws: no Drinking while eating: no Chewing/swallowing difficulties: no Changes in vision: no Changes to mood/headaches: no Hair loss/changes to skin/nails: no Difficulty focusing/concentrating: no Sweating: no Dizziness/lightheadedness: no Palpitations: no Carbonated/caffeinated beverages: no N/V/D/C/Gas: no Abdominal pain: no Dumping syndrome: no    NUTRITION DIAGNOSIS  Overweight/obesity ([place]-3.3) related to past poor dietary habits and physical inactivity as evidenced by completed bariatric surgery and following dietary guidelines for continued weight loss and healthy nutrition status.     NUTRITION INTERVENTION Nutrition counseling (C-1) and education (E-2) to facilitate bariatric surgery goals, including: . The importance of consuming adequate calories as well as certain nutrients daily due to the body's need for essential vitamins, minerals, and fats . The importance of daily physical activity and to reach a goal of at least 150 minutes of moderate to vigorous physical activity weekly (or as directed by their physician) due to benefits such as increased musculature and improved lab values  Goals: -Great job on reaching some of your goals!!  -Look into getting your C-PAP fixed  -Check your blood sugar at any times in the day as often as you can  -[place] job on saying No!!  -Start taking  the appropriate multivitamin   -start eating healthy snacks and bring them with you in a cooler  Handouts Provided Include   Meal ideas  Learning Style & Readiness for Change Teaching method utilized: Visual & Auditory  Demonstrated degree of understanding via: Teach Back  Barriers to learning/adherence to lifestyle change: struggles with taking care of herself due to trouble with saying no to her family.   RD's Notes for Next Visit . Assess adherence to pt chosen goals   MONITORING & EVALUATION Dietary intake, weekly physical activity, body weight,

## 2019-03-04 NOTE — Patient Instructions (Addendum)
-  Great job on reaching some of your goals!!  -Look into getting your C-PAP fixed  -Check your blood sugar at any times in the day as often as you can  -Great job on saying No!!  -Start taking the appropriate multivitamin   -start eating healthy snacks and bring them with you in a cooler

## 2019-03-06 ENCOUNTER — Other Ambulatory Visit: Payer: Self-pay | Admitting: Orthopedic Surgery

## 2019-03-06 ENCOUNTER — Telehealth: Payer: Self-pay | Admitting: Radiology

## 2019-03-06 DIAGNOSIS — M1711 Unilateral primary osteoarthritis, right knee: Secondary | ICD-10-CM

## 2019-03-06 NOTE — Telephone Encounter (Signed)
Patient on list for scheduling a right total knee replacement  I left message for her to call me back to schedule

## 2019-03-06 NOTE — Telephone Encounter (Signed)
March 9th

## 2019-03-18 ENCOUNTER — Telehealth: Payer: Self-pay | Admitting: Orthopedic Surgery

## 2019-03-18 NOTE — Telephone Encounter (Signed)
I called her she will call back to RS when numbers are better

## 2019-03-18 NOTE — Telephone Encounter (Signed)
She states her Hemoglobin is 9 she needs to have iron infusion Will not start until after surgery I told her we may need to RS until this number is better  Do you want to postpone her knee replacement? 04/01/19 If so, how long do you want to postpone?

## 2019-03-18 NOTE — Telephone Encounter (Signed)
Needs to have hg 111-2 before surgery so post pone until we have a lab stating that

## 2019-03-18 NOTE — Telephone Encounter (Signed)
Carrie Cook called and left a message on our voicemail stating that she has learned from her PCP that she is anemic.  She says she is scheduled to have IV therapy for this.  She is scheduled to have a total knee done on 04/01/19.  She does have questions regarding her having the surgery.  Would you call her please?  Thanks so much

## 2019-03-19 ENCOUNTER — Telehealth: Payer: Self-pay | Admitting: *Deleted

## 2019-03-19 ENCOUNTER — Other Ambulatory Visit: Payer: Self-pay | Admitting: *Deleted

## 2019-03-19 MED ORDER — LANTUS SOLOSTAR 100 UNIT/ML ~~LOC~~ SOPN: PEN_INJECTOR | SUBCUTANEOUS | 5 refills | Status: DC

## 2019-03-19 NOTE — Telephone Encounter (Signed)
Pt calling to check on referral. States she got a letter to call if she has not heard anything. Called number on the letter and the gi office did not know what she was talking about.  States her total knee replacement has been put off due to her iron being low and now she does not know what to do. I see the referral was sent. Can you check on this.

## 2019-03-19 NOTE — Telephone Encounter (Signed)
Spoke with pt, she states that [facility] doesn't do iron infusion, changed referral to [facility], pt aware that their office will contact her

## 2019-03-25 ENCOUNTER — Encounter (HOSPITAL_COMMUNITY): Payer: Self-pay | Admitting: *Deleted

## 2019-03-25 ENCOUNTER — Other Ambulatory Visit: Payer: Self-pay

## 2019-03-26 ENCOUNTER — Inpatient Hospital Stay (HOSPITAL_COMMUNITY): Payer: Medicare Other | Attending: Hematology | Admitting: Hematology

## 2019-03-26 ENCOUNTER — Inpatient Hospital Stay (HOSPITAL_COMMUNITY): Payer: Medicare Other

## 2019-03-26 VITALS — BP 143/78 | HR 112 | Temp 97.1°F | Resp 16 | Ht 63.0 in | Wt 205.8 lb

## 2019-03-26 DIAGNOSIS — E785 Hyperlipidemia, unspecified: Secondary | ICD-10-CM | POA: Insufficient documentation

## 2019-03-26 DIAGNOSIS — I10 Essential (primary) hypertension: Secondary | ICD-10-CM | POA: Diagnosis not present

## 2019-03-26 DIAGNOSIS — D509 Iron deficiency anemia, unspecified: Secondary | ICD-10-CM | POA: Diagnosis not present

## 2019-03-26 DIAGNOSIS — E669 Obesity, unspecified: Secondary | ICD-10-CM | POA: Diagnosis not present

## 2019-03-26 DIAGNOSIS — F419 Anxiety disorder, unspecified: Secondary | ICD-10-CM | POA: Insufficient documentation

## 2019-03-26 DIAGNOSIS — Z794 Long term (current) use of insulin: Secondary | ICD-10-CM | POA: Insufficient documentation

## 2019-03-26 DIAGNOSIS — G4730 Sleep apnea, unspecified: Secondary | ICD-10-CM | POA: Diagnosis not present

## 2019-03-26 DIAGNOSIS — Z8052 Family history of malignant neoplasm of bladder: Secondary | ICD-10-CM | POA: Diagnosis not present

## 2019-03-26 DIAGNOSIS — Z90710 Acquired absence of both cervix and uterus: Secondary | ICD-10-CM | POA: Insufficient documentation

## 2019-03-26 DIAGNOSIS — Z803 Family history of malignant neoplasm of breast: Secondary | ICD-10-CM | POA: Insufficient documentation

## 2019-03-26 DIAGNOSIS — N920 Excessive and frequent menstruation with regular cycle: Secondary | ICD-10-CM | POA: Diagnosis not present

## 2019-03-26 DIAGNOSIS — Z9884 Bariatric surgery status: Secondary | ICD-10-CM | POA: Diagnosis not present

## 2019-03-26 DIAGNOSIS — E119 Type 2 diabetes mellitus without complications: Secondary | ICD-10-CM | POA: Diagnosis not present

## 2019-03-26 DIAGNOSIS — Z79899 Other long term (current) drug therapy: Secondary | ICD-10-CM | POA: Diagnosis not present

## 2019-03-26 DIAGNOSIS — R5383 Other fatigue: Secondary | ICD-10-CM | POA: Diagnosis not present

## 2019-03-26 DIAGNOSIS — Z8719 Personal history of other diseases of the digestive system: Secondary | ICD-10-CM | POA: Insufficient documentation

## 2019-03-26 DIAGNOSIS — Z7951 Long term (current) use of inhaled steroids: Secondary | ICD-10-CM | POA: Diagnosis not present

## 2019-03-26 DIAGNOSIS — Z87442 Personal history of urinary calculi: Secondary | ICD-10-CM | POA: Diagnosis not present

## 2019-03-26 LAB — SAVE SMEAR(SSMR), FOR PROVIDER SLIDE REVIEW

## 2019-03-26 LAB — COMPREHENSIVE METABOLIC PANEL
ALT: 20 U/L (ref 0–44)
AST: 18 U/L (ref 15–41)
Albumin: 4 g/dL (ref 3.5–5.0)
Alkaline Phosphatase: 135 U/L — ABNORMAL HIGH (ref 38–126)
Anion gap: 11 (ref 5–15)
BUN: 11 mg/dL (ref 6–20)
CO2: 24 mmol/L (ref 22–32)
Calcium: 9 mg/dL (ref 8.9–10.3)
Chloride: 102 mmol/L (ref 98–111)
Creatinine, Ser: 0.68 mg/dL (ref 0.44–1.00)
GFR calc Af Amer: >60 mL/min (ref >60)
GFR calc non Af Amer: >60 mL/min (ref >60)
Glucose, Bld: 185 mg/dL — ABNORMAL HIGH (ref 70–99)
Potassium: 4.4 mmol/L (ref 3.5–5.1)
Sodium: 137 mmol/L (ref 135–145)
Total Bilirubin: 0.4 mg/dL (ref 0.3–1.2)
Total Protein: 7.1 g/dL (ref 6.5–8.1)

## 2019-03-26 LAB — RETICULOCYTES
Immature Retic Fract: 15.2 % (ref 2.3–15.9)
RBC.: 4.94 MIL/uL (ref 3.87–5.11)
Retic Count, Absolute: 70.6 10*3/uL (ref 19.0–186.0)
Retic Ct Pct: 1.4 % (ref 0.4–3.1)

## 2019-03-26 LAB — VITAMIN D 25 HYDROXY (VIT D DEFICIENCY, FRACTURES): Vit D, 25-Hydroxy: 19.2 ng/mL — ABNORMAL LOW (ref 30–100)

## 2019-03-26 LAB — CBC WITH DIFFERENTIAL/PLATELET
Basophils Absolute: 0 10*3/uL (ref 0.0–0.1)
Basophils Relative: 0 %
Eosinophils Absolute: 0.1 10*3/uL (ref 0.0–0.5)
Eosinophils Relative: 1 %
HCT: 40.4 % (ref 36.0–46.0)
Hemoglobin: 12.5 g/dL (ref 12.0–15.0)
Lymphocytes Relative: 21 %
Lymphs Abs: 2.3 10*3/uL (ref 0.7–4.0)
MCH: 25.2 pg — ABNORMAL LOW (ref 26.0–34.0)
MCHC: 30.9 g/dL (ref 30.0–36.0)
MCV: 81.5 fL (ref 80.0–100.0)
Monocytes Absolute: 0.8 10*3/uL (ref 0.1–1.0)
Monocytes Relative: 7 %
Neutro Abs: 7.5 10*3/uL (ref 1.7–7.7)
Neutrophils Relative %: 70 %
Platelets: 350 10*3/uL (ref 150–400)
RBC: 4.96 MIL/uL (ref 3.87–5.11)
RDW: 13.9 % (ref 11.5–15.5)
WBC: 10.7 10*3/uL — ABNORMAL HIGH (ref 4.0–10.5)
nRBC: 0 % (ref 0.0–0.2)

## 2019-03-26 LAB — FOLATE: Folate: 19.1 ng/mL (ref >5.9)

## 2019-03-26 LAB — IRON AND TIBC
Iron: 12 ug/dL — ABNORMAL LOW (ref 28–170)
Saturation Ratios: 3 % — ABNORMAL LOW (ref 10.4–31.8)
TIBC: 383 ug/dL (ref 250–450)
UIBC: 371 ug/dL

## 2019-03-26 LAB — VITAMIN B12: Vitamin B-12: 1381 pg/mL — ABNORMAL HIGH (ref 180–914)

## 2019-03-26 LAB — LACTATE DEHYDROGENASE: LDH: 152 U/L (ref 98–192)

## 2019-03-26 LAB — FERRITIN: Ferritin: 8 ng/mL — ABNORMAL LOW (ref 11–307)

## 2019-03-26 NOTE — Assessment & Plan Note (Addendum)
1.  Severe iron deficiency state: -She had a history of gastric bypass done in 2015. -Ferritin was severely low on 02/17/2019 at 8. -Colonoscopy in October 2014 shows mild colonic diverticulosis with normal exam.  Ultrasound of the abdomen in July 2014 shows normal spleen and liver. -Denies any bleeding per rectum or melena. -Has been taking iron tablet intermittently.  Complains of severe tiredness. -Based on her severe iron deficiency state and decreased absorption from gastric bypass surgery, we have recommended Feraheme weekly x2.  We discussed about the side effects in detail. -We will also check for coexisting nutritional deficiencies including [id number], folic acid, copper levels.  We will also check for hemolysis with LDH, reticulocyte count and haptoglobin. -We will reevaluate her in 2 months with repeat labs.  2.  Family history: -Mother had cervical cancer.  Father had bladder cancer.  Maternal aunt had throat cancer.  Paternal aunt had breast cancer. -I do not see any recent mammograms in our system.  We will follow up on it with the patient.

## 2019-03-26 NOTE — Patient Instructions (Addendum)
[facility] at [facility] Discharge Instructions  Follow up in 2 months with labs    Thank you for choosing [facility] at [facility] to provide your oncology and hematology care.  To afford each patient quality time with our provider, please arrive at least 15 minutes before your scheduled appointment time.   If you have a lab appointment with the Cancer Center please come in thru the Main Entrance and check in at the main information desk.  You need to re-schedule your appointment should you arrive 10 or more minutes late.  We strive to give you quality time with our providers, and arriving late affects you and other patients whose appointments are after yours.  Also, if you no show three or more times for appointments you may be dismissed from the clinic at the providers discretion.     Again, thank you for choosing [facility].  Our hope is that these requests will decrease the amount of time that you wait before being seen by our physicians.       _____________________________________________________________  Should you have questions after your visit to  Cancer Center, please contact our office at [phone] between the hours of 8:00 a.m. and 4:30 p.m.  Voicemails left after 4:00 p.m. will not be returned until the following business day.  For prescription refill requests, have your pharmacy contact our office and allow 72 hours.    Due to Covid, you will need to wear a mask upon entering the hospital. If you do not have a mask, a mask will be given to you at the Main Entrance upon arrival. For doctor visits, patients may have 1 support person with them. For treatment visits, patients can not have anyone with them due to social distancing guidelines and our immunocompromised population.      

## 2019-03-26 NOTE — Progress Notes (Signed)
CONSULT NOTE  Patient Care Team: Kathyrn Drown, MD as PCP - General (Family Medicine) Rothbart, Cristopher Estimable, MD (Cardiology) Himmelrich, Bryson Ha, RD (Inactive) as Dietitian (Bariatrics)  CHIEF COMPLAINTS/PURPOSE OF CONSULTATION: Iron deficiency anemia  HISTORY OF PRESENTING ILLNESS:  Carrie Cook 59 y.o. female was sent here by her PCP Dr. Sallee Lange for iron deficiency anemia. Patient reports she was on Protonix back in 2013 and 14 where she struggled with iron deficiency anemia and needing iron infusions.  She had a gastric bypass surgery done in 2014 and no longer needed the Protonix.  Patient reports she also had a heavy menstrual cycle which also contributing to her anemia.  Patient had a hysterectomy done in 2019.  Patient reports she has gained about half of her weight back from her gastric bypass surgery and needed to go back on her Protonix.  Since she started her Protonix back she has noticed she has been severely fatigued.  Her iron levels were checked at her PCP and her ferritin was 9.  She reports she has tried oral iron supplements in the past and they do not work for her.  She does report having obstructive sleep apnea and uses a CPAP at night.  She has used this since 2014 prior to her gastric bypass surgery.  She did stop using it for a number of years but has recently started using it back since gaining weight back.  She denies any over-the-counter NSAID ingestion or any antiplatelet agents.  She does report she has a small amount of bleeding due to her external hemorrhoid.  But denies any epistasis or hematuria.  She denies any recent chest pain on exertion, shortness of breath on minimal exertion, presyncopal episodes or palpitations.  She has no prior history or diagnosis of cancer.  And her age-appropriate screenings are up-to-date.  Patient denies any pica and eats a variety diet.  She denies ever needing a blood transfusion.  The last time she donated blood was over 20 years  ago. -She has a family history of a mother with cervical cancer.  Father with bladder cancer.  Maternal aunt with throat cancer.  And paternal aunt with breast cancer.    MEDICAL HISTORY:  Past Medical History:  Diagnosis Date  . Anemia    PT HAS HAD COLONOSCOPY AND ENDOSCOPY WORK UP - NO PROBLEMS FOUND AS SOURCE OF ANEMIA -- PT HAD IRON INFUSION AT [facility] 11/13/12-AFTER SEEING HEMATOLOGIST DR. Watson AND HE GAVE HEMATOLOGIC CLEARANCE FOR GASTRIC BYPASS SURGERY.  Marland Kitchen Anxiety   . Asthma    daily and prn inhalers  . Degenerative joint disease    right knee, spine - STATES INTERMITTENT  NUMBNESS DOWN RT LEG WITH PROLONGED STANDING OR WALKING- THINKS RELATED TO HER SPINE PROBLEMS  . Dental crowns present   . Depression   . Family history of anesthesia complication    pt's mother and sister have hx. of post-op N/V  . Fibroids    UTERINE  . Gastroesophageal reflux disease    none for 2 years since Bariatric surgery.  . H/O hiatal hernia   . History of endometriosis   . History of kidney stones   . Hyperlipidemia   . Hypertension    under control with med., has been on med. x 2 yr.  . Insulin dependent diabetes mellitus   . Lock jaw    jaw locks open if opens mouth wide  . Migraines   . Neuropathy    FEET  .  Obesity   . Palpitations   . PONV (postoperative nausea and vomiting)   . Shortness of breath    with daily activities  . Sleep apnea    post-op didn't need CPAP but now states needs it again but not using    SURGICAL HISTORY: Past Surgical History:  Procedure Laterality Date  . ABDOMINAL HYSTERECTOMY N/A 06/06/2017   Procedure: HYSTERECTOMY ABDOMINAL;  Surgeon: Florian Buff, MD;  Location: [facility];  Service: Gynecology;  Laterality: N/A;  . BREATH TEK H PYLORI N/A 08/13/2012   Procedure: BREATH TEK H PYLORI;  Surgeon: Edward Jolly, MD;  Location: [facility];  Service: General;  Laterality: N/A;  . CARPAL TUNNEL RELEASE  01/03/2012   Procedure: CARPAL  TUNNEL RELEASE;  Surgeon: Wynonia Sours, MD;  Location: [facility];  Service: Orthopedics;  Laterality: Right;  . carpal tunnel release right hand Right 12/13  . COLONOSCOPY  05/2009   [id number] hemorrhoids otherwise normal colon, rectum and terminal ileum  . COLONOSCOPY WITH ESOPHAGOGASTRODUODENOSCOPY (EGD) N/A 10/28/2012   Procedure: COLONOSCOPY WITH ESOPHAGOGASTRODUODENOSCOPY (EGD);  Surgeon: Daneil Dolin, MD;  [facility];  Service: Endoscopy;  Laterality: N/A;  7:30  . DILITATION & CURRETTAGE/HYSTROSCOPY WITH NOVASURE ABLATION N/A 07/22/2014   Procedure: DILATATION & CURETTAGE/HYSTEROSCOPY WITH NOVASURE ABLATION; uterine length 6.0 cm; uterine width 3.8 cm; total ablation time 1 minute 15 seconds;  Surgeon: Florian Buff, MD;  Location: [facility];  Service: Gynecology;  Laterality: N/A;  . ESOPHAGOGASTRODUODENOSCOPY  5/013/2011   [id number] esophagus/multiple polyps removed s/p (hyperplastic). Gastritis without H.pylori.  . ESOPHAGOGASTRODUODENOSCOPY (EGD) WITH PROPOFOL N/A 06/06/2018   with LA Grade A esophagitis s/p dilation.   Marland Kitchen GASTRIC ROUX-EN-Y N/A 11/19/2012   Procedure: LAPAROSCOPIC ROUX-EN-Y GASTRIC;  Surgeon: Edward Jolly, MD;  Location[facility];  Service: General;  Laterality: N/A;  . GIVENS CAPSULE STUDY N/A 10/28/2012   Procedure: GIVENS CAPSULE STUDY;  Surgeon: Daneil Dolin, MD;  [facility];  Service: Endoscopy;  Laterality: N/A;  . KNEE ARTHROSCOPY WITH MEDIAL MENISECTOMY Right 10/28/2015   Procedure: RIGHT KNEE ARTHROSCOPY WITH MEDIAL MENISECTOMY;  Surgeon: Carole Civil, MD;  Location: [facility];  Service: Orthopedics;  Laterality: Right;  . LAPAROSCOPY     due to endometriosis  . MALONEY DILATION N/A 06/06/2018   Procedure: Venia Minks DILATION;  Surgeon: Daneil Dolin, MD;  [facility];  Service: Endoscopy;  Laterality: N/A;  . PELVIC LAPAROSCOPY  11/04/1999   with fulguration of endometriosis  . SALPINGOOPHORECTOMY  Bilateral 06/06/2017   Procedure: SALPINGO OOPHORECTOMY;  Surgeon: Florian Buff, MD;  Location: [facility];  Service: Gynecology;  Laterality: Bilateral;  . TRIGGER FINGER RELEASE Right 09/24/2012   Procedure: RELEASE A-1 PULLEY OF RIGHT THUMB;  Surgeon: Wynonia Sours, MD;  Location: [facility];  Service: Orthopedics;  Laterality: Right;  . TUBAL LIGATION  1993    SOCIAL HISTORY: Social History   Socioeconomic History  . Marital status: Married    Spouse name: Herbie Baltimore  . Number of children: 1  . Years of education: Not on file  . Highest education level: Not on file  Occupational History  . Occupation: disability  Tobacco Use  . Smoking status: Never Smoker  . Smokeless tobacco: Never Used  Substance and Sexual Activity  . Alcohol use: No  . Drug use: No  . Sexual activity: Not Currently    Birth control/protection: Surgical  Other Topics Concern  . Not on  file  Social History Narrative  . Not on file   Social Determinants of Health   Financial Resource Strain:   . Difficulty of Paying Living Expenses: Not on file  Food Insecurity:   . Worried About Charity fundraiser in the Last Year: Not on file  . Ran Out of Food in the Last Year: Not on file  Transportation Needs:   . Lack of Transportation (Medical): Not on file  . Lack of Transportation (Non-Medical): Not on file  Physical Activity:   . Days of Exercise per Week: Not on file  . Minutes of Exercise per Session: Not on file  Stress:   . Feeling of Stress : Not on file  Social Connections:   . Frequency of Communication with Friends and Family: Not on file  . Frequency of Social Gatherings with Friends and Family: Not on file  . Attends Religious Services: Not on file  . Active Member of Clubs or Organizations: Not on file  . Attends Archivist Meetings: Not on file  . Marital Status: Not on file  Intimate Partner Violence:   . Fear of Current or Ex-Partner: Not on file  . Emotionally  Abused: Not on file  . Physically Abused: Not on file  . Sexually Abused: Not on file    FAMILY HISTORY: Family History  Problem Relation Age of Onset  . Hypertension Father   . Hyperlipidemia Father   . COPD Father   . COPD Mother   . Heart disease Mother   . Cancer Mother        Cervix  . Anesthesia problems Mother        post-op N/V  . Thyroid disease Mother   . Diabetes Maternal Grandfather   . Thyroid disease Sister   . Anesthesia problems Sister        post-op N/V  . Thyroid disease Paternal Uncle   . Diabetes Other   . Colon cancer Neg Hx   . Colon polyps Neg Hx     ALLERGIES:  is allergic to qvar [beclomethasone]; codeine; prozac [fluoxetine hcl]; lipitor [atorvastatin calcium]; dyazide [hydrochlorothiazide w-triamterene]; erythromycin; lisinopril; pravastatin; and sulfonamide derivatives.  MEDICATIONS:  Current Outpatient Medications  Medication Sig Dispense Refill  . albuterol (PROVENTIL HFA;VENTOLIN HFA) 108 (90 Base) MCG/ACT inhaler Inhale 2 puffs into the lungs every 6 (six) hours as needed for wheezing. (Patient not taking: Reported on 03/25/2019) 1 Inhaler 6  . amLODipine (NORVASC) 2.5 MG tablet Take one tablet po daily (Patient taking differently: Take 2.5 mg by mouth daily. Take one tablet po daily) 30 tablet 6  . ARIPiprazole (ABILIFY) 2 MG tablet Take 2 mg by mouth daily.     . cetirizine (ZYRTEC) 10 MG tablet Take 10 mg by mouth daily.    . Cyanocobalamin (VITAMIN B-12) 1000 MCG SUBL Place 1,000 mcg under the tongue daily.    Marland Kitchen estradiol (ESTRACE) 2 MG tablet Take 1 tablet (2 mg total) by mouth at bedtime. 90 tablet 3  . fluticasone (FLOVENT HFA) 220 MCG/ACT inhaler Inhale 2 puffs into the lungs 2 (two) times daily. (Patient not taking: Reported on 03/25/2019) 1 Inhaler 12  . Insulin Glargine (LANTUS SOLOSTAR) 100 UNIT/ML Solostar Pen Use 24 units qhs. May titrate to 30 units qhs 5 pen 5  . Insulin Pen Needle (PEN NEEDLES) 32G X 4 MM MISC 32 g by Does not  apply route daily. 90 each 0  . lamoTRIgine (LAMICTAL) 100 MG tablet Take 150 mg by  mouth 2 (two) times daily.   3  . losartan (COZAAR) 100 MG tablet TAKE 1 TABLET BY MOUTH DAILY 90 tablet 1  . Melatonin 10 MG SUBL Place 10 mg under the tongue at bedtime.     . metFORMIN (GLUCOPHAGE) 500 MG tablet TAKE 1 TABLET(500 MG) BY MOUTH TWICE DAILY WITH A MEAL 180 tablet 0  . Multiple Vitamin (MULTIVITAMIN WITH MINERALS) TABS tablet Take 1 tablet by mouth daily.     . pantoprazole (PROTONIX) 40 MG tablet TAKE 1 TABLET BY MOUTH EVERY DAY 90 tablet 3  . rosuvastatin (CRESTOR) 5 MG tablet Take one tablet po every day 30 tablet 5  . venlafaxine (EFFEXOR) 100 MG tablet Take 100 mg by mouth 2 (two) times daily with a meal.   1   No current facility-administered medications for this visit.    REVIEW OF SYSTEMS:   Constitutional: Denies fevers, chills or abnormal night sweats, +severe fatigue Respiratory: Denies cough, dyspnea or wheezes Cardiovascular: Denies palpitation, chest discomfort or lower extremity swelling Gastrointestinal:  Denies nausea, heartburn or change in bowel habits Skin: Denies abnormal skin rashes Lymphatics: Denies new lymphadenopathy or easy bruising Neurological:Denies numbness, tingling or new weaknesses Behavioral/Psych: Mood is stable, no new changes  All other systems were reviewed with the patient and are negative.  PHYSICAL EXAMINATION: ECOG PERFORMANCE STATUS: 1 - Symptomatic but completely ambulatory  Vitals:   03/26/19 0934  BP: (!) 143/78  Pulse: (!) 112  Resp: 16  Temp: (!) 97.1 F (36.2 C)  SpO2: 97%   Filed Weights   03/26/19 0934  Weight: 205 lb 12.8 oz (93.4 kg)    GENERAL:alert, no distress and comfortable SKIN: skin color, texture, turgor are normal, no rashes or significant lesions NECK: supple, thyroid normal size, non-tender, without nodularity LYMPH:  no palpable lymphadenopathy in the cervical, axillary or inguinal LUNGS: clear to  auscultation and percussion with normal breathing effort HEART: regular rate & rhythm and no murmurs and no lower extremity edema ABDOMEN:abdomen soft, non-tender and normal bowel sounds Musculoskeletal:no cyanosis of digits and no clubbing  PSYCH: alert & oriented x 3 with fluent speech NEURO: no focal motor/sensory deficits  LABORATORY DATA:  I have reviewed the data as listed Recent Results (from the past 2160 hour(s))  Hemoglobin A1c     Status: Abnormal   Collection Time: 02/17/19  8:59 AM  Result Value Ref Range   Hgb A1c MFr Bld 8.3 (H) 4.8 - 5.6 %    Comment:          Prediabetes: 5.7 - 6.4          Diabetes: >6.4          Glycemic control for adults with diabetes: <7.0    Est. average glucose Bld gHb Est-mCnc 192 mg/dL  Lipid Profile     Status: Abnormal   Collection Time: 02/17/19  8:59 AM  Result Value Ref Range   Cholesterol, Total 177 100 - 199 mg/dL   Triglycerides 237 (H) 0 - 149 mg/dL   HDL 45 >39 mg/dL   VLDL Cholesterol Cal 40 5 - 40 mg/dL   LDL Chol Calc (NIH) 92 0 - 99 mg/dL   Chol/HDL Ratio 3.9 0.0 - 4.4 ratio    Comment:                                   T. Chol/HDL Ratio  Men  Women                               1/2 Avg.Risk  3.4    3.3                                   Avg.Risk  5.0    4.4                                2X Avg.Risk  9.6    7.1                                3X Avg.Risk 23.4   11.0   Hepatic function panel     Status: Abnormal   Collection Time: 02/17/19  8:59 AM  Result Value Ref Range   Total Protein 6.3 6.0 - 8.5 g/dL   Albumin 4.1 3.8 - 4.9 g/dL   Bilirubin Total 0.3 0.0 - 1.2 mg/dL   Bilirubin, Direct 0.07 0.00 - 0.40 mg/dL   Alkaline Phosphatase 148 (H) 39 - 117 IU/L   AST 17 0 - 40 IU/L   ALT 12 0 - 32 IU/L  Basic Metabolic Panel (BMET)     Status: Abnormal   Collection Time: 02/17/19  8:59 AM  Result Value Ref Range   Glucose 138 (H) 65 - 99 mg/dL   BUN 10 6 - 24 mg/dL    Creatinine, Ser 0.63 0.57 - 1.00 mg/dL   GFR calc non Af Amer 101 >59 mL/min/1.73   GFR calc Af Amer 116 >59 mL/min/1.73   BUN/Creatinine Ratio 16 9 - 23   Sodium 140 134 - 144 mmol/L   Potassium 4.4 3.5 - 5.2 mmol/L   Chloride 102 96 - 106 mmol/L   CO2 25 20 - 29 mmol/L   Calcium 9.0 8.7 - 10.2 mg/dL  CBC with Differential     Status: Abnormal   Collection Time: 02/17/19  8:59 AM  Result Value Ref Range   WBC 9.0 3.4 - 10.8 x10E3/uL   RBC 4.71 3.77 - 5.28 x10E6/uL   Hemoglobin 12.2 11.1 - 15.9 g/dL   Hematocrit 37.6 34.0 - 46.6 %   MCV 80 79 - 97 fL   MCH 25.9 (L) 26.6 - 33.0 pg   MCHC 32.4 31.5 - 35.7 g/dL   RDW 13.5 11.7 - 15.4 %   Platelets 339 150 - 450 x10E3/uL   Neutrophils 65 Not Estab. %   Lymphs 24 Not Estab. %   Monocytes 8 Not Estab. %   Eos 2 Not Estab. %   Basos 1 Not Estab. %   Neutrophils Absolute 5.9 1.4 - 7.0 x10E3/uL   Lymphocytes Absolute 2.1 0.7 - 3.1 x10E3/uL   Monocytes Absolute 0.8 0.1 - 0.9 x10E3/uL   EOS (ABSOLUTE) 0.2 0.0 - 0.4 x10E3/uL   Basophils Absolute 0.1 0.0 - 0.2 x10E3/uL   Immature Granulocytes 0 Not Estab. %   Immature Grans (Abs) 0.0 0.0 - 0.1 x10E3/uL  Ferritin     Status: Abnormal   Collection Time: 02/17/19  8:59 AM  Result Value Ref Range   Ferritin 9 (L) 15 - 150 ng/mL  CBC with Differential/Platelet     Status: Abnormal   Collection Time: 03/26/19  10:59 AM  Result Value Ref Range   WBC 10.7 (H) 4.0 - 10.5 K/uL   RBC 4.96 3.87 - 5.11 MIL/uL   Hemoglobin 12.5 12.0 - 15.0 g/dL   HCT 40.4 36.0 - 46.0 %   MCV 81.5 80.0 - 100.0 fL   MCH 25.2 (L) 26.0 - 34.0 pg   MCHC 30.9 30.0 - 36.0 g/dL   RDW 13.9 11.5 - 15.5 %   Platelets 350 150 - 400 K/uL   nRBC 0.0 0.0 - 0.2 %   Neutrophils Relative % 70 %   Neutro Abs 7.5 1.7 - 7.7 K/uL   Lymphocytes Relative 21 %   Lymphs Abs 2.3 0.7 - 4.0 K/uL   Monocytes Relative 7 %   Monocytes Absolute 0.8 0.1 - 1.0 K/uL   Eosinophils Relative 1 %   Eosinophils Absolute 0.1 0.0 - 0.5 K/uL    Basophils Relative 0 %   Basophils Absolute 0.0 0.0 - 0.1 K/uL    Comment: Performed at [facility], [address], [place], [place] [address]  Comprehensive metabolic panel     Status: Abnormal   Collection Time: 03/26/19 10:59 AM  Result Value Ref Range   Sodium 137 135 - 145 mmol/L   Potassium 4.4 3.5 - 5.1 mmol/L   Chloride 102 98 - 111 mmol/L   CO2 24 22 - 32 mmol/L   Glucose, Bld 185 (H) 70 - 99 mg/dL    Comment: Glucose reference range applies only to samples taken after fasting for at least 8 hours.   BUN 11 6 - 20 mg/dL   Creatinine, Ser 0.68 0.44 - 1.00 mg/dL   Calcium 9.0 8.9 - 10.3 mg/dL   Total Protein 7.1 6.5 - 8.1 g/dL   Albumin 4.0 3.5 - 5.0 g/dL   AST 18 15 - 41 U/L   ALT 20 0 - 44 U/L   Alkaline Phosphatase 135 (H) 38 - 126 U/L   Total Bilirubin 0.4 0.3 - 1.2 mg/dL   GFR calc non Af Amer >60 >60 mL/min   GFR calc Af Amer >60 >60 mL/min   Anion gap 11 5 - 15    Comment: Performed at [facility], [address] [place], [place] [address]  Ferritin     Status: Abnormal   Collection Time: 03/26/19 10:59 AM  Result Value Ref Range   Ferritin 8 (L) 11 - 307 ng/mL    Comment: Performed at [facility] [address] [place], [place] [address]  Iron and TIBC     Status: Abnormal   Collection Time: 03/26/19 10:59 AM  Result Value Ref Range   Iron 12 (L) 28 - 170 ug/dL   TIBC 383 250 - 450 ug/dL   Saturation Ratios 3 (L) 10.4 - 31.8 %   UIBC 371 ug/dL    Comment: Performed at [facility], [address], [place], [place] [address]  Save Smear [facility])     Status: None   Collection Time: 03/26/19 10:59 AM  Result Value Ref Range   Smear Review SMEAR STAINED AND AVAILABLE FOR REVIEW     Comment: Performed at [facility], [address], [place], [place] [address]  Reticulocytes     Status: None   Collection Time: 03/26/19 10:59 AM  Result Value Ref Range   Retic Ct Pct 1.4 0.4 - 3.1 %   RBC. 4.94 3.87 - 5.11 MIL/uL   Retic Count, Absolute 70.6 19.0 - 186.0 K/uL    Immature Retic Fract 15.2 2.3 -  15.9 %    Comment: Performed at [facility], [address], [place], [place] [address]  Folate     Status: None   Collection Time: 03/26/19 10:59 AM  Result Value Ref Range   Folate 19.1 >5.9 ng/mL    Comment: Performed at [facility], [address], [place], [place] [address]  VITAMIN D 25 Hydroxy (Vit-D Deficiency, Fractures)     Status: Abnormal   Collection Time: 03/26/19 10:59 AM  Result Value Ref Range   Vit D, 25-Hydroxy 19.20 (L) 30 - 100 ng/mL    Comment: (NOTE) Vitamin D deficiency has been defined by the Institute of Medicine  and an Endocrine Society practice guideline as a level of serum 25-OH  vitamin D less than 20 ng/mL (1,2). The Endocrine Society went on to  further define vitamin D insufficiency as a level between 21 and 29  ng/mL (2). 1. IOM (Institute of Medicine). 2010. Dietary reference intakes for  calcium and D. [place]: The [employer]. 2. Holick MF, Binkley Victoria, Bischoff-Ferrari HA, et al. Evaluation,  treatment, and prevention of vitamin D deficiency: an Endocrine  Society clinical practice guideline, JCEM. 2011 Jul; 96(7): 1911-30. Performed at [facility], [place] [address], [place], [place] [address]   Vitamin B12     Status: Abnormal   Collection Time: 03/26/19 10:59 AM  Result Value Ref Range   Vitamin B-12 1,381 (H) 180 - 914 pg/mL    Comment: (NOTE) This assay is not validated for testing neonatal or myeloproliferative syndrome specimens for Vitamin B12 levels. Performed at [facility], [address], [place], [place] [address]   Lactate dehydrogenase     Status: None   Collection Time: 03/26/19 10:59 AM  Result Value Ref Range   LDH 152 98 - 192 U/L    Comment: Performed at [facility], [address], [place], [place] [address]    RADIOGRAPHIC STUDIES: I have personally reviewed the radiological images as listed and agreed with the findings in the report. I have independently  examined and elicited history from the patient.  I agree with the HPI written by my nurse practitioner Wenda Low, FNP.  I have independently formulated my assessment and plan.  ASSESSMENT & PLAN:  Iron deficiency anemia 1.  Severe iron deficiency state: -She had a history of gastric bypass done in 2015. -Ferritin was severely low on 02/17/2019 at 8. -Colonoscopy in October 2014 shows mild colonic diverticulosis with normal exam.  Ultrasound of the abdomen in July 2014 shows normal spleen and liver. -Denies any bleeding per rectum or melena. -Has been taking iron tablet intermittently.  Complains of severe tiredness. -Based on her severe iron deficiency state and decreased absorption from gastric bypass surgery, we have recommended Feraheme weekly x2.  We discussed about the side effects in detail. -We will also check for coexisting nutritional deficiencies including [id number], folic acid, copper levels.  We will also check for hemolysis with LDH, reticulocyte count and haptoglobin. -We will reevaluate her in 2 months with repeat labs.  2.  Family history: -Mother had cervical cancer.  Father had bladder cancer.  Maternal aunt had throat cancer.  Paternal aunt had breast cancer. -I do not see any recent mammograms in our system.  We will follow up on it with the patient.     All questions were answered. The patient knows to call the clinic with any problems, questions or concerns.      Derek Jack, MD 03/26/19 5:29 PM

## 2019-03-27 LAB — HAPTOGLOBIN: Haptoglobin: 113 mg/dL (ref 33–346)

## 2019-03-28 ENCOUNTER — Encounter (HOSPITAL_COMMUNITY): Payer: Self-pay

## 2019-03-28 ENCOUNTER — Encounter (HOSPITAL_COMMUNITY): Payer: Medicare Other

## 2019-03-28 ENCOUNTER — Inpatient Hospital Stay (HOSPITAL_COMMUNITY): Payer: Medicare Other

## 2019-03-28 ENCOUNTER — Other Ambulatory Visit: Payer: Self-pay

## 2019-03-28 VITALS — BP 130/74 | HR 91 | Temp 98.1°F | Resp 18

## 2019-03-28 DIAGNOSIS — D509 Iron deficiency anemia, unspecified: Secondary | ICD-10-CM | POA: Diagnosis not present

## 2019-03-28 LAB — COPPER, SERUM: Copper: 187 ug/dL — ABNORMAL HIGH (ref 80–158)

## 2019-03-28 MED ORDER — SODIUM CHLORIDE 0.9 % IV SOLN: Freq: Once | INTRAVENOUS | Status: AC

## 2019-03-28 MED ORDER — SODIUM CHLORIDE 0.9 % IV SOLN
510.0000 mg | Freq: Once | INTRAVENOUS | Status: AC
  Administered 2019-03-28: 510 mg via INTRAVENOUS
  Filled 2019-03-28: qty 510

## 2019-03-28 NOTE — Patient Instructions (Signed)
[facility] Discharge Instructions for Patients Receiving Chemotherapy  Today you received the following chemotherapy agents   To help prevent nausea and vomiting after your treatment, we encourage you to take your nausea medication   If you develop nausea and vomiting that is not controlled by your nausea medication, call the clinic.   BELOW ARE SYMPTOMS THAT SHOULD BE REPORTED IMMEDIATELY:  *FEVER GREATER THAN 100.5 F  *CHILLS WITH OR WITHOUT FEVER  NAUSEA AND VOMITING THAT IS NOT CONTROLLED WITH YOUR NAUSEA MEDICATION  *UNUSUAL SHORTNESS OF BREATH  *UNUSUAL BRUISING OR BLEEDING  TENDERNESS IN MOUTH AND THROAT WITH OR WITHOUT PRESENCE OF ULCERS  *URINARY PROBLEMS  *BOWEL PROBLEMS  UNUSUAL RASH Items with * indicate a potential emergency and should be followed up as soon as possible.  Feel free to call the clinic should you have any questions or concerns. The clinic phone number is [phone].  Please show the CHEMO ALERT CARD at check-in to the Emergency Department and triage nurse.   

## 2019-03-28 NOTE — Progress Notes (Signed)
Patient presents today for Feraheme infusion. Vital signs stable. Patient has no complaints of any changes since her last visit. Patient requests copy of lab work. Labs printed and given to patient at this time.   Feraheme given today per MD orders. Tolerated infusion without adverse affects. Vital signs stable. No complaints at this time. Discharged from clinic ambulatory. F/U with [facility] as scheduled.

## 2019-03-31 ENCOUNTER — Other Ambulatory Visit (HOSPITAL_COMMUNITY): Payer: Medicare Other

## 2019-04-01 ENCOUNTER — Encounter (HOSPITAL_COMMUNITY): Admission: RE | Payer: Self-pay | Source: Home / Self Care

## 2019-04-01 ENCOUNTER — Ambulatory Visit (HOSPITAL_COMMUNITY): Admission: RE | Admit: 2019-04-01 | Payer: Medicare Other | Source: Home / Self Care | Admitting: Orthopedic Surgery

## 2019-04-01 ENCOUNTER — Encounter: Payer: Medicare Other | Attending: Surgery | Admitting: Skilled Nursing Facility1

## 2019-04-01 ENCOUNTER — Other Ambulatory Visit: Payer: Self-pay

## 2019-04-01 DIAGNOSIS — Z794 Long term (current) use of insulin: Secondary | ICD-10-CM | POA: Diagnosis not present

## 2019-04-01 DIAGNOSIS — F329 Major depressive disorder, single episode, unspecified: Secondary | ICD-10-CM | POA: Diagnosis not present

## 2019-04-01 DIAGNOSIS — Z9884 Bariatric surgery status: Secondary | ICD-10-CM | POA: Diagnosis not present

## 2019-04-01 DIAGNOSIS — Z713 Dietary counseling and surveillance: Secondary | ICD-10-CM | POA: Diagnosis not present

## 2019-04-01 DIAGNOSIS — E119 Type 2 diabetes mellitus without complications: Secondary | ICD-10-CM | POA: Diagnosis not present

## 2019-04-01 DIAGNOSIS — I10 Essential (primary) hypertension: Secondary | ICD-10-CM | POA: Insufficient documentation

## 2019-04-01 DIAGNOSIS — J45909 Unspecified asthma, uncomplicated: Secondary | ICD-10-CM | POA: Diagnosis not present

## 2019-04-01 DIAGNOSIS — Z882 Allergy status to sulfonamides status: Secondary | ICD-10-CM | POA: Insufficient documentation

## 2019-04-01 DIAGNOSIS — Z888 Allergy status to other drugs, medicaments and biological substances status: Secondary | ICD-10-CM | POA: Diagnosis not present

## 2019-04-01 DIAGNOSIS — E669 Obesity, unspecified: Secondary | ICD-10-CM

## 2019-04-01 DIAGNOSIS — F419 Anxiety disorder, unspecified: Secondary | ICD-10-CM | POA: Insufficient documentation

## 2019-04-01 DIAGNOSIS — Z885 Allergy status to narcotic agent status: Secondary | ICD-10-CM | POA: Insufficient documentation

## 2019-04-01 DIAGNOSIS — M1711 Unilateral primary osteoarthritis, right knee: Secondary | ICD-10-CM | POA: Insufficient documentation

## 2019-04-01 DIAGNOSIS — Z79899 Other long term (current) drug therapy: Secondary | ICD-10-CM | POA: Diagnosis not present

## 2019-04-01 LAB — METHYLMALONIC ACID, SERUM: Methylmalonic Acid, Quantitative: 128 nmol/L (ref 0–378)

## 2019-04-01 SURGERY — ARTHROPLASTY, KNEE, TOTAL
Anesthesia: Choice | Site: Knee | Laterality: Right

## 2019-04-01 NOTE — Progress Notes (Signed)
Bariatric Nutrition Follow-Up Visit Medical Nutrition Therapy   NUTRITION ASSESSMENT   Anthropometrics   Body Composition Scale Date 03/04/2019  Weight  lbs 205.3 204.6  Total Body Fat  % 42.1 42.1     Visceral Fat 14 14  Fat-Free Mass  % 57.8 57.8     Total Body Water  % 43.4 43.4     Muscle-Mass  lbs 29.2 29.2  BMI 36 35.8  Body Fat Displacement ---         Torso  lbs 53.5 53.5        Left Leg  lbs 10.7 10.6        Right Leg  lbs 10.7 10.6        Left Arm  lbs 5.3 5.3        Right Arm  lbs 5.3 5.3   Clinical  Medical hx: depression, anxiety Medications: insulin, metformin, protonix, vitamin b12 Labs: A1C 6.9   Lifestyle & Dietary Hx  Pt needs reminders she has done very well and not discourage herself.   Pt states she was supposed to have knee surgery but she needs iron infusions first.  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  Pt states she has never tested her well and also states she does not have a filtration system.  Pt states he is tried and run down and has had to run the kids around.  Pt states she did not check her blood sugar art all. Pt states he did order new equipment for her C-PAP. Pt states she has gotten back to eating chocolate but has avoided cookies.  Pt states she is realizing her husband does not treat her well.   Estimated daily fluid intake: unknown oz Estimated daily protein intake: 60+ g Supplements: none Current average weekly physical activity: ADL's  24-Hr Dietary Recall: fast food a couple times a week; almonds First Meal: sugary cereal  Snack: crackers Second Meal: fast food Snack: cookies Third Meal: frozen meal or fast food Snack: cereal and other snacks Beverages: 4-5 a day coffee, 32 ounces maybe water  Post-Op Goals/ Signs/ Symptoms Using straws: no Drinking while eating: no Chewing/swallowing difficulties: no Changes in vision: no Changes to mood/headaches: no Hair loss/changes to skin/nails:  no Difficulty focusing/concentrating: no Sweating: no Dizziness/lightheadedness: no Palpitations: no Carbonated/caffeinated beverages: no N/V/D/C/Gas: no Abdominal pain: no Dumping syndrome: no    NUTRITION DIAGNOSIS  Overweight/obesity ([place]-3.3) related to past poor dietary habits and physical inactivity as evidenced by completed bariatric surgery and following dietary guidelines for continued weight loss and healthy nutrition status.     NUTRITION INTERVENTION Nutrition counseling (C-1) and education (E-2) to facilitate bariatric surgery goals, including: . The importance of consuming adequate calories as well as certain nutrients daily due to the body's need for essential vitamins, minerals, and fats . The importance of daily physical activity and to reach a goal of at least 150 minutes of moderate to vigorous physical activity weekly (or as directed by their physician) due to benefits such as increased musculature and improved lab values  Goals: -Great job on reaching some of your goals!!  -continue wearing your C-PAP  -Check your blood sugar at any times in the day as often as you can  -Great job on saying No!!  -Start taking the appropriate multivitamin (celebrate from [place]) and start taking calcium   -start eating healthy snacks and bring them with you in a cooler  -Get a water testing kit today  and test your water today, get a couple gallons of water while you are at the store   Handouts Provided Include   Meal ideas  Learning Style & Readiness for Change Teaching method utilized: Visual & Auditory  Demonstrated degree of understanding via: Teach Back  Barriers to learning/adherence to lifestyle change: struggles with taking care of herself due to trouble with saying no to her family.   RD's Notes for Next Visit . Assess adherence to pt chosen goals   MONITORING & EVALUATION Dietary intake, weekly physical activity, body weight,

## 2019-04-03 NOTE — Progress Notes (Deleted)
..  Pharmacist Chemotherapy Monitoring - Follow Up Assessment    I verify that I have reviewed each item in the below checklist:  Regimen:  . Patient due for treatment based on regimen frequency. . Plan date matches the patient's scheduled date. . Premedications are appropriate for the patient's regimen. . Assessed if patient has a supportive care therapy plans, and if patient is due for treatment.  Organ Function and Labs: . Appropriate labs and drug-specific monitoring are ordered prior to the patient's treatment.  Dose Assessment: . If applicable to patient's regimen, lifetime cumulative doses have been properly documented and assessed.  Toxicity Monitoring/Prevention: . Patient has appropriate take home medications prescribed.  . Medication allergies and previous infusion related reactions, if applicable, have been reviewed and addressed.  Order Review: . Treatment plan orders signed and/or patient is scheduled to see a provider prior to their treatment.  Plan for follow-up and/or issues identified: . {yes/no:20286} . I-vent associated with next due treatment: {yes/no:20286} . MD and/or nursing notified: {yes/no:20286}  Wynona Neat 04/03/2019 1:35 PM

## 2019-04-04 ENCOUNTER — Inpatient Hospital Stay (HOSPITAL_COMMUNITY): Payer: Medicare Other

## 2019-04-04 ENCOUNTER — Encounter (HOSPITAL_COMMUNITY): Payer: Self-pay

## 2019-04-04 ENCOUNTER — Other Ambulatory Visit: Payer: Self-pay

## 2019-04-04 VITALS — BP 118/77 | HR 98 | Temp 97.1°F | Resp 18

## 2019-04-04 DIAGNOSIS — D509 Iron deficiency anemia, unspecified: Secondary | ICD-10-CM

## 2019-04-04 MED ORDER — SODIUM CHLORIDE 0.9 % IV SOLN
510.0000 mg | Freq: Once | INTRAVENOUS | Status: AC
  Administered 2019-04-04: 510 mg via INTRAVENOUS
  Filled 2019-04-04: qty 510

## 2019-04-04 MED ORDER — SODIUM CHLORIDE 0.9 % IV SOLN: Freq: Once | INTRAVENOUS | Status: AC

## 2019-04-04 NOTE — Progress Notes (Signed)
Patient tolerated iron infusion with no complaints voiced.  Peripheral IV site clean and dry with good blood return noted before and after infusion.  Band aid applied.  VSS with discharge and left ambulatory with no s/s of distress noted.  

## 2019-04-24 ENCOUNTER — Ambulatory Visit: Payer: Medicare Other | Attending: Internal Medicine

## 2019-04-24 DIAGNOSIS — Z23 Encounter for immunization: Secondary | ICD-10-CM

## 2019-04-24 NOTE — Progress Notes (Signed)
   Covid-19 Vaccination Clinic  Name:  Carrie Cook    MRN: [id number] DOB: [birth year]  04/24/2019  [name] was observed post Covid-19 immunization for 15 minutes without incident. She was provided with Vaccine Information Sheet and instruction to access the V-Safe system.   [name] was instructed to call 911 with any severe reactions post vaccine: Marland Kitchen Difficulty breathing  . Swelling of face and throat  . A fast heartbeat  . A bad rash all over body  . Dizziness and weakness   Immunizations Administered    Name Date Dose VIS Date Route   Moderna COVID-19 Vaccine 04/24/2019 11:06 AM 0.5 mL 12/24/2018 Intramuscular   Manufacturer: Moderna   Lot: [id number]   [id number]

## 2019-04-29 ENCOUNTER — Encounter: Payer: Medicare Other | Attending: Surgery | Admitting: Skilled Nursing Facility1

## 2019-04-29 ENCOUNTER — Other Ambulatory Visit: Payer: Self-pay | Admitting: Family Medicine

## 2019-04-29 DIAGNOSIS — F419 Anxiety disorder, unspecified: Secondary | ICD-10-CM | POA: Insufficient documentation

## 2019-04-29 DIAGNOSIS — I10 Essential (primary) hypertension: Secondary | ICD-10-CM | POA: Insufficient documentation

## 2019-04-29 DIAGNOSIS — J45909 Unspecified asthma, uncomplicated: Secondary | ICD-10-CM | POA: Diagnosis not present

## 2019-04-29 DIAGNOSIS — Z882 Allergy status to sulfonamides status: Secondary | ICD-10-CM | POA: Diagnosis not present

## 2019-04-29 DIAGNOSIS — M1711 Unilateral primary osteoarthritis, right knee: Secondary | ICD-10-CM | POA: Diagnosis not present

## 2019-04-29 DIAGNOSIS — Z794 Long term (current) use of insulin: Secondary | ICD-10-CM | POA: Diagnosis not present

## 2019-04-29 DIAGNOSIS — Z885 Allergy status to narcotic agent status: Secondary | ICD-10-CM | POA: Insufficient documentation

## 2019-04-29 DIAGNOSIS — Z79899 Other long term (current) drug therapy: Secondary | ICD-10-CM | POA: Insufficient documentation

## 2019-04-29 DIAGNOSIS — E119 Type 2 diabetes mellitus without complications: Secondary | ICD-10-CM | POA: Insufficient documentation

## 2019-04-29 DIAGNOSIS — F329 Major depressive disorder, single episode, unspecified: Secondary | ICD-10-CM | POA: Insufficient documentation

## 2019-04-29 DIAGNOSIS — Z713 Dietary counseling and surveillance: Secondary | ICD-10-CM | POA: Diagnosis not present

## 2019-04-29 DIAGNOSIS — Z888 Allergy status to other drugs, medicaments and biological substances status: Secondary | ICD-10-CM | POA: Insufficient documentation

## 2019-04-29 DIAGNOSIS — Z9884 Bariatric surgery status: Secondary | ICD-10-CM | POA: Diagnosis not present

## 2019-04-29 DIAGNOSIS — E669 Obesity, unspecified: Secondary | ICD-10-CM

## 2019-04-29 NOTE — Progress Notes (Signed)
Bariatric Nutrition Follow-Up Visit Medical Nutrition Therapy   NUTRITION ASSESSMENT   Anthropometrics   Body Composition Scale Date 03/04/2019  Weight  lbs 205.3 204.6  Total Body Fat  % 42.1 42.1     Visceral Fat 14 14  Fat-Free Mass  % 57.8 57.8     Total Body Water  % 43.4 43.4     Muscle-Mass  lbs 29.2 29.2  BMI 36 35.8  Body Fat Displacement ---         Torso  lbs 53.5 53.5        Left Leg  lbs 10.7 10.6        Right Leg  lbs 10.7 10.6        Left Arm  lbs 5.3 5.3        Right Arm  lbs 5.3 5.3   Clinical  Medical hx: depression, anxiety Medications: insulin, metformin, protonix, vitamin b12 Labs: A1C 6.9   Lifestyle & Dietary Hx  Pt needs reminders she has done very well and not discourage herself.   MyChart Appt.  Pt states she tested her well water and it was high in copper so she is now drinking gallons of water and will get a filter when she can save up enough money and will test her water every 6 months now. Pt states she is still saying no to her children. Pt states she has not worked on getting in [employer]. Pt states she will start taking the bariatric advantage ultra solo + iron but forget to take calcium. Pt states since stopping her drinking water she has better vision and feels better all around. Pt states her husband over does it with weed killer around the well.  Estimated daily fluid intake: unknown oz Estimated daily protein intake: 60+ g Supplements: none Current average weekly physical activity: ADL's  24-Hr Dietary Recall: fast food a couple times a week; almonds First Meal: sugary cereal  Snack: crackers Second Meal: fast food Snack: cookies Third Meal: frozen meal or fast food Snack: cereal and other snacks Beverages: 4-5 a day coffee, 32 ounces maybe water  Post-Op Goals/ Signs/ Symptoms Using straws: no Drinking while eating: no Chewing/swallowing difficulties: no Changes in vision: no Changes to mood/headaches: no Hair  loss/changes to skin/nails: no Difficulty focusing/concentrating: no Sweating: no Dizziness/lightheadedness: no Palpitations: no Carbonated/caffeinated beverages: no N/V/D/C/Gas: no Abdominal pain: no Dumping syndrome: no    NUTRITION DIAGNOSIS  Overweight/obesity ([place]-3.3) related to past poor dietary habits and physical inactivity as evidenced by completed bariatric surgery and following dietary guidelines for continued weight loss and healthy nutrition status.     NUTRITION INTERVENTION Nutrition counseling (C-1) and education (E-2) to facilitate bariatric surgery goals, including: . The importance of consuming adequate calories as well as certain nutrients daily due to the body's need for essential vitamins, minerals, and fats . The importance of daily physical activity and to reach a goal of at least 150 minutes of moderate to vigorous physical activity weekly (or as directed by their physician) due to benefits such as increased musculature and improved lab values  Goals: -Great job on reaching some of your goals!! -continue wearing your C-PAP -Check your blood sugar at any times in the day as often as you can -Great job on saying No!! -take ultra solo + iron; capsule is okay but have food on your stomach first -take 3 calcium citrate a day taken at least 2 hours a part -Advsie your husband to stop spraying chemicals around the  well  Handouts Provided Include   Meal ideas  Learning Style & Readiness for Change Teaching method utilized: Visual & Auditory  Demonstrated degree of understanding via: Teach Back  Barriers to learning/adherence to lifestyle change: struggles with taking care of herself due to trouble with saying no to her family.   RD's Notes for Next Visit . Assess adherence to pt chosen goals   MONITORING & EVALUATION Dietary intake, weekly physical activity, body weight,

## 2019-04-30 DIAGNOSIS — F339 Major depressive disorder, recurrent, unspecified: Secondary | ICD-10-CM | POA: Diagnosis not present

## 2019-05-27 ENCOUNTER — Ambulatory Visit: Payer: Medicare Other | Attending: Internal Medicine

## 2019-05-27 DIAGNOSIS — Z23 Encounter for immunization: Secondary | ICD-10-CM

## 2019-05-27 NOTE — Progress Notes (Signed)
   Covid-19 Vaccination Clinic  Name:  Carrie Cook    MRN: [id number] DOB: 15-Aug-1960  05/27/2019  Ms. Garron was observed post Covid-19 immunization for 15 minutes without incident. She was provided with Vaccine Information Sheet and instruction to access the V-Safe system.   Ms. Bromberg was instructed to call 911 with any severe reactions post vaccine: Marland Kitchen Difficulty breathing  . Swelling of face and throat  . A fast heartbeat  . A bad rash all over body  . Dizziness and weakness   Immunizations Administered    Name Date Dose VIS Date Route   Moderna COVID-19 Vaccine 05/27/2019 12:15 PM 0.5 mL 12/2018 Intramuscular   Manufacturer: Moderna   Lot: [id number]   [id number]

## 2019-05-28 ENCOUNTER — Other Ambulatory Visit (HOSPITAL_COMMUNITY): Payer: Self-pay | Admitting: *Deleted

## 2019-05-28 DIAGNOSIS — D509 Iron deficiency anemia, unspecified: Secondary | ICD-10-CM

## 2019-05-29 ENCOUNTER — Other Ambulatory Visit: Payer: Self-pay

## 2019-05-29 ENCOUNTER — Inpatient Hospital Stay (HOSPITAL_COMMUNITY): Payer: Medicare Other | Attending: Hematology

## 2019-05-29 DIAGNOSIS — R200 Anesthesia of skin: Secondary | ICD-10-CM | POA: Insufficient documentation

## 2019-05-29 DIAGNOSIS — Z9884 Bariatric surgery status: Secondary | ICD-10-CM | POA: Insufficient documentation

## 2019-05-29 DIAGNOSIS — Z803 Family history of malignant neoplasm of breast: Secondary | ICD-10-CM | POA: Insufficient documentation

## 2019-05-29 DIAGNOSIS — R42 Dizziness and giddiness: Secondary | ICD-10-CM | POA: Diagnosis not present

## 2019-05-29 DIAGNOSIS — F329 Major depressive disorder, single episode, unspecified: Secondary | ICD-10-CM | POA: Insufficient documentation

## 2019-05-29 DIAGNOSIS — Z87442 Personal history of urinary calculi: Secondary | ICD-10-CM | POA: Insufficient documentation

## 2019-05-29 DIAGNOSIS — Z79899 Other long term (current) drug therapy: Secondary | ICD-10-CM | POA: Insufficient documentation

## 2019-05-29 DIAGNOSIS — I10 Essential (primary) hypertension: Secondary | ICD-10-CM | POA: Diagnosis not present

## 2019-05-29 DIAGNOSIS — Z801 Family history of malignant neoplasm of trachea, bronchus and lung: Secondary | ICD-10-CM | POA: Insufficient documentation

## 2019-05-29 DIAGNOSIS — D509 Iron deficiency anemia, unspecified: Secondary | ICD-10-CM | POA: Insufficient documentation

## 2019-05-29 DIAGNOSIS — G4700 Insomnia, unspecified: Secondary | ICD-10-CM | POA: Diagnosis not present

## 2019-05-29 DIAGNOSIS — Z90721 Acquired absence of ovaries, unilateral: Secondary | ICD-10-CM | POA: Diagnosis not present

## 2019-05-29 DIAGNOSIS — E669 Obesity, unspecified: Secondary | ICD-10-CM | POA: Diagnosis not present

## 2019-05-29 DIAGNOSIS — G4730 Sleep apnea, unspecified: Secondary | ICD-10-CM | POA: Diagnosis not present

## 2019-05-29 DIAGNOSIS — R51 Headache with orthostatic component, not elsewhere classified: Secondary | ICD-10-CM | POA: Diagnosis not present

## 2019-05-29 DIAGNOSIS — E119 Type 2 diabetes mellitus without complications: Secondary | ICD-10-CM | POA: Insufficient documentation

## 2019-05-29 DIAGNOSIS — R5383 Other fatigue: Secondary | ICD-10-CM | POA: Insufficient documentation

## 2019-05-29 DIAGNOSIS — E785 Hyperlipidemia, unspecified: Secondary | ICD-10-CM | POA: Diagnosis not present

## 2019-05-29 DIAGNOSIS — Z794 Long term (current) use of insulin: Secondary | ICD-10-CM | POA: Diagnosis not present

## 2019-05-29 DIAGNOSIS — K5730 Diverticulosis of large intestine without perforation or abscess without bleeding: Secondary | ICD-10-CM | POA: Diagnosis not present

## 2019-05-29 LAB — VITAMIN B12: Vitamin B-12: 618 pg/mL (ref 180–914)

## 2019-05-29 LAB — IRON AND TIBC
Iron: 66 ug/dL (ref 28–170)
Saturation Ratios: 24 % (ref 10.4–31.8)
TIBC: 273 ug/dL (ref 250–450)
UIBC: 207 ug/dL

## 2019-05-29 LAB — CBC WITH DIFFERENTIAL/PLATELET
Abs Immature Granulocytes: 0.02 10*3/uL (ref 0.00–0.07)
Basophils Absolute: 0 10*3/uL (ref 0.0–0.1)
Basophils Relative: 1 %
Eosinophils Absolute: 0.2 10*3/uL (ref 0.0–0.5)
Eosinophils Relative: 3 %
HCT: 48 % — ABNORMAL HIGH (ref 36.0–46.0)
Hemoglobin: 15.9 g/dL — ABNORMAL HIGH (ref 12.0–15.0)
Immature Granulocytes: 0 %
Lymphocytes Relative: 22 %
Lymphs Abs: 1.4 10*3/uL (ref 0.7–4.0)
MCH: 29 pg (ref 26.0–34.0)
MCHC: 33.1 g/dL (ref 30.0–36.0)
MCV: 87.4 fL (ref 80.0–100.0)
Monocytes Absolute: 0.6 10*3/uL (ref 0.1–1.0)
Monocytes Relative: 9 %
Neutro Abs: 4.1 10*3/uL (ref 1.7–7.7)
Neutrophils Relative %: 65 %
Platelets: 274 10*3/uL (ref 150–400)
RBC: 5.49 MIL/uL — ABNORMAL HIGH (ref 3.87–5.11)
RDW: 15.2 % (ref 11.5–15.5)
WBC: 6.2 10*3/uL (ref 4.0–10.5)
nRBC: 0 % (ref 0.0–0.2)

## 2019-05-29 LAB — COMPREHENSIVE METABOLIC PANEL
ALT: 26 U/L (ref 0–44)
AST: 25 U/L (ref 15–41)
Albumin: 4 g/dL (ref 3.5–5.0)
Alkaline Phosphatase: 135 U/L — ABNORMAL HIGH (ref 38–126)
Anion gap: 10 (ref 5–15)
BUN: 8 mg/dL (ref 6–20)
CO2: 25 mmol/L (ref 22–32)
Calcium: 9 mg/dL (ref 8.9–10.3)
Chloride: 101 mmol/L (ref 98–111)
Creatinine, Ser: 0.79 mg/dL (ref 0.44–1.00)
GFR calc Af Amer: >60 mL/min (ref >60)
GFR calc non Af Amer: >60 mL/min (ref >60)
Glucose, Bld: 337 mg/dL — ABNORMAL HIGH (ref 70–99)
Potassium: 4 mmol/L (ref 3.5–5.1)
Sodium: 136 mmol/L (ref 135–145)
Total Bilirubin: 0.4 mg/dL (ref 0.3–1.2)
Total Protein: 7.3 g/dL (ref 6.5–8.1)

## 2019-05-29 LAB — LACTATE DEHYDROGENASE: LDH: 128 U/L (ref 98–192)

## 2019-05-29 LAB — VITAMIN D 25 HYDROXY (VIT D DEFICIENCY, FRACTURES): Vit D, 25-Hydroxy: 19.87 ng/mL — ABNORMAL LOW (ref 30–100)

## 2019-05-29 LAB — FOLATE: Folate: 20.4 ng/mL (ref >5.9)

## 2019-05-29 LAB — FERRITIN: Ferritin: 59 ng/mL (ref 11–307)

## 2019-06-04 ENCOUNTER — Inpatient Hospital Stay (HOSPITAL_BASED_OUTPATIENT_CLINIC_OR_DEPARTMENT_OTHER): Payer: Medicare Other | Admitting: Nurse Practitioner

## 2019-06-04 ENCOUNTER — Encounter (HOSPITAL_COMMUNITY): Payer: Self-pay | Admitting: Nurse Practitioner

## 2019-06-04 ENCOUNTER — Other Ambulatory Visit: Payer: Self-pay

## 2019-06-04 VITALS — BP 144/94 | HR 106 | Temp 97.1°F | Resp 16 | Wt 207.0 lb

## 2019-06-04 DIAGNOSIS — E559 Vitamin D deficiency, unspecified: Secondary | ICD-10-CM

## 2019-06-04 DIAGNOSIS — D509 Iron deficiency anemia, unspecified: Secondary | ICD-10-CM

## 2019-06-04 LAB — METHYLMALONIC ACID, SERUM: Methylmalonic Acid, Quantitative: 158 nmol/L (ref 0–378)

## 2019-06-04 NOTE — Assessment & Plan Note (Signed)
1.  Severe iron deficiency state: -She had a history of gastric bypass done 2015. -Ferritin was severely low on 02/17/2019 at 8. -Colonoscopy in October 2014 showed mild colonic diverticulosis with normal exam. -Ultrasound of the abdomen in July 2014 showed normal spleen and liver. -Denies any bright red bleeding per rectum or melena. -She has been taking iron tablets intermittently.  Complains of severe tiredness. -Last IV Feraheme was given on 03/28/2019 and 04/04/2019 -She did feel some energy improvement with the IV iron. -Her labs on 05/29/2019 showed her hemoglobin 15.7, ferritin 59, percent saturation 24 -I do not recommend any IV iron at this time. -She will follow-up in 3 months with repeat labs.  2.  Family history: -Mother had cervical cancer. -Father had bladder cancer. -Maternal aunt had throat cancer. -Maternal aunt had breast cancer.

## 2019-06-04 NOTE — Progress Notes (Signed)
[place], [place] [address]   CLINIC:  Medical Oncology/Hematology  PCP:  Kathyrn Drown, MD [address] [place] [address] [phone]   REASON FOR VISIT: Follow-up for iron deficiency anemia   CURRENT THERAPY: Intermittent iron infusions   INTERVAL HISTORY:  Carrie Cook 59 y.o. female returns for routine follow-up for iron deficiency anemia.  Patient reports she did have some energy improvement with her last iron infusions.  She denies any bright red bleeding per rectum.  She denies any melena.  She denies any easy bruising or bleeding. Denies any nausea, vomiting, or diarrhea. Denies any new pains. Had not noticed any recent bleeding such as epistaxis, hematuria or hematochezia. Denies recent chest pain on exertion, shortness of breath on minimal exertion, pre-syncopal episodes, or palpitations. Denies any numbness or tingling in hands or feet. Denies any recent fevers, infections, or recent hospitalizations. Patient reports appetite at 100% and energy level at 0%.  She is eating well maintain her weight this time.    REVIEW OF SYSTEMS:  Review of Systems  Constitutional: Positive for fatigue.  Neurological: Positive for dizziness, headaches and numbness.  Psychiatric/Behavioral: Positive for depression and sleep disturbance. The patient is nervous/anxious.   All other systems reviewed and are negative.    PAST MEDICAL/SURGICAL HISTORY:  Past Medical History:  Diagnosis Date  . Anemia    PT HAS HAD COLONOSCOPY AND ENDOSCOPY WORK UP - NO PROBLEMS FOUND AS SOURCE OF ANEMIA -- PT HAD IRON INFUSION AT [facility] 11/13/12-AFTER SEEING HEMATOLOGIST DR. Mesick AND HE GAVE HEMATOLOGIC CLEARANCE FOR GASTRIC BYPASS SURGERY.  Marland Kitchen Anxiety   . Asthma    daily and prn inhalers  . Degenerative joint disease    right knee, spine - STATES INTERMITTENT  NUMBNESS DOWN RT LEG WITH PROLONGED STANDING OR WALKING- THINKS RELATED TO HER  SPINE PROBLEMS  . Dental crowns present   . Depression   . Family history of anesthesia complication    pt's mother and sister have hx. of post-op N/V  . Fibroids    UTERINE  . Gastroesophageal reflux disease    none for 2 years since Bariatric surgery.  . H/O hiatal hernia   . History of endometriosis   . History of kidney stones   . Hyperlipidemia   . Hypertension    under control with med., has been on med. x 2 yr.  . Insulin dependent diabetes mellitus   . Lock jaw    jaw locks open if opens mouth wide  . Migraines   . Neuropathy    FEET  . Obesity   . Palpitations   . PONV (postoperative nausea and vomiting)   . Shortness of breath    with daily activities  . Sleep apnea    post-op didn't need CPAP but now states needs it again but not using   Past Surgical History:  Procedure Laterality Date  . ABDOMINAL HYSTERECTOMY N/A 06/06/2017   Procedure: HYSTERECTOMY ABDOMINAL;  Surgeon: Florian Buff, MD;  Location: [facility];  Service: Gynecology;  Laterality: N/A;  . BREATH TEK H PYLORI N/A 08/13/2012   Procedure: BREATH TEK H PYLORI;  Surgeon: Edward Jolly, MD;  Location: [facility];  Service: General;  Laterality: N/A;  . CARPAL TUNNEL RELEASE  01/03/2012   Procedure: CARPAL TUNNEL RELEASE;  Surgeon: Wynonia Sours, MD;  Location: [place];  Service: Orthopedics;  Laterality: Right;  . carpal tunnel release right  hand Right 12/13  . COLONOSCOPY  05/2009   [id number] hemorrhoids otherwise normal colon, rectum and terminal ileum  . COLONOSCOPY WITH ESOPHAGOGASTRODUODENOSCOPY (EGD) N/A 10/28/2012   Procedure: COLONOSCOPY WITH ESOPHAGOGASTRODUODENOSCOPY (EGD);  Surgeon: Daneil Dolin, MD;  [facility];  Service: Endoscopy;  Laterality: N/A;  7:30  . DILITATION & CURRETTAGE/HYSTROSCOPY WITH NOVASURE ABLATION N/A 07/22/2014   Procedure: DILATATION & CURETTAGE/HYSTEROSCOPY WITH NOVASURE ABLATION; uterine length 6.0 cm; uterine width 3.8 cm; total  ablation time 1 minute 15 seconds;  Surgeon: Florian Buff, MD;  Location: [facility];  Service: Gynecology;  Laterality: N/A;  . ESOPHAGOGASTRODUODENOSCOPY  5/013/2011   [id number] esophagus/multiple polyps removed s/p (hyperplastic). Gastritis without H.pylori.  . ESOPHAGOGASTRODUODENOSCOPY (EGD) WITH PROPOFOL N/A 06/06/2018   with LA Grade A esophagitis s/p dilation.   Marland Kitchen GASTRIC ROUX-EN-Y N/A 11/19/2012   Procedure: LAPAROSCOPIC ROUX-EN-Y GASTRIC;  Surgeon: Edward Jolly, MD;  Location[facility];  Service: General;  Laterality: N/A;  . GIVENS CAPSULE STUDY N/A 10/28/2012   Procedure: GIVENS CAPSULE STUDY;  Surgeon: Daneil Dolin, MD;  [facility];  Service: Endoscopy;  Laterality: N/A;  . KNEE ARTHROSCOPY WITH MEDIAL MENISECTOMY Right 10/28/2015   Procedure: RIGHT KNEE ARTHROSCOPY WITH MEDIAL MENISECTOMY;  Surgeon: Carole Civil, MD;  Location: [facility];  Service: Orthopedics;  Laterality: Right;  . LAPAROSCOPY     due to endometriosis  . MALONEY DILATION N/A 06/06/2018   Procedure: Venia Minks DILATION;  Surgeon: Daneil Dolin, MD;  [facility];  Service: Endoscopy;  Laterality: N/A;  . PELVIC LAPAROSCOPY  11/04/1999   with fulguration of endometriosis  . SALPINGOOPHORECTOMY Bilateral 06/06/2017   Procedure: SALPINGO OOPHORECTOMY;  Surgeon: Florian Buff, MD;  Location: [facility];  Service: Gynecology;  Laterality: Bilateral;  . TRIGGER FINGER RELEASE Right 09/24/2012   Procedure: RELEASE A-1 PULLEY OF RIGHT THUMB;  Surgeon: Wynonia Sours, MD;  Location: [facility];  Service: Orthopedics;  Laterality: Right;  . TUBAL LIGATION  1993     SOCIAL HISTORY:  Social History   Socioeconomic History  . Marital status: Married    Spouse name: Herbie Baltimore  . Number of children: 1  . Years of education: Not on file  . Highest education level: Not on file  Occupational History  . Occupation: disability  Tobacco Use  . Smoking status: Never Smoker  . Smokeless  tobacco: Never Used  Substance and Sexual Activity  . Alcohol use: No  . Drug use: No  . Sexual activity: Not Currently    Birth control/protection: Surgical  Other Topics Concern  . Not on file  Social History Narrative  . Not on file   Social Determinants of Health   Financial Resource Strain:   . Difficulty of Paying Living Expenses:   Food Insecurity:   . Worried About Charity fundraiser in the Last Year:   . Arboriculturist in the Last Year:   Transportation Needs:   . Film/video editor (Medical):   Marland Kitchen Lack of Transportation (Non-Medical):   Physical Activity:   . Days of Exercise per Week:   . Minutes of Exercise per Session:   Stress:   . Feeling of Stress :   Social Connections:   . Frequency of Communication with Friends and Family:   . Frequency of Social Gatherings with Friends and Family:   . Attends Religious Services:   . Active Member of Clubs or Organizations:   . Attends Club  or Organization Meetings:   Marland Kitchen Marital Status:   Intimate Partner Violence:   . Fear of Current or Ex-Partner:   . Emotionally Abused:   Marland Kitchen Physically Abused:   . Sexually Abused:     FAMILY HISTORY:  Family History  Problem Relation Age of Onset  . Hypertension Father   . Hyperlipidemia Father   . COPD Father   . COPD Mother   . Heart disease Mother   . Cancer Mother        Cervix  . Anesthesia problems Mother        post-op N/V  . Thyroid disease Mother   . Diabetes Maternal Grandfather   . Thyroid disease Sister   . Anesthesia problems Sister        post-op N/V  . Thyroid disease Paternal Uncle   . Diabetes Other   . Colon cancer Neg Hx   . Colon polyps Neg Hx     CURRENT MEDICATIONS:  Outpatient Encounter Medications as of 06/04/2019  Medication Sig Note  . ARIPiprazole (ABILIFY) 5 MG tablet 1/2 po bid   . lamoTRIgine (LAMICTAL) 100 MG tablet one po bid   . albuterol (PROVENTIL HFA;VENTOLIN HFA) 108 (90 Base) MCG/ACT inhaler Inhale 2 puffs into the lungs  every 6 (six) hours as needed for wheezing.   Marland Kitchen amLODipine (NORVASC) 2.5 MG tablet Take one tablet po daily (Patient taking differently: Take 2.5 mg by mouth daily. Take one tablet po daily)   . cetirizine (ZYRTEC) 10 MG tablet Take 10 mg by mouth daily.   . Cyanocobalamin (VITAMIN B-12) 1000 MCG SUBL Place 1,000 mcg under the tongue daily.   Marland Kitchen estradiol (ESTRACE) 2 MG tablet Take 1 tablet (2 mg total) by mouth at bedtime.   . fluticasone (FLOVENT HFA) 220 MCG/ACT inhaler Inhale 2 puffs into the lungs 2 (two) times daily.   . Insulin Glargine (LANTUS SOLOSTAR) 100 UNIT/ML Solostar Pen Use 24 units qhs. May titrate to 30 units qhs   . Insulin Pen Needle (PEN NEEDLES) 32G X 4 MM MISC 32 g by Does not apply route daily.   Marland Kitchen losartan (COZAAR) 100 MG tablet TAKE 1 TABLET BY MOUTH DAILY   . Melatonin 10 MG SUBL Place 10 mg under the tongue at bedtime.    . metFORMIN (GLUCOPHAGE) 500 MG tablet TAKE 1 TABLET(500 MG) BY MOUTH TWICE DAILY WITH A MEAL   . Multiple Vitamin (MULTIVITAMIN WITH MINERALS) TABS tablet Take 1 tablet by mouth daily.    . rosuvastatin (CRESTOR) 5 MG tablet Take one tablet po every day   . venlafaxine (EFFEXOR) 100 MG tablet Take 100 mg by mouth 2 (two) times daily with a meal.    . [DISCONTINUED] ARIPiprazole (ABILIFY) 2 MG tablet Take 2 mg by mouth daily.    . [DISCONTINUED] ARIPiprazole (ABILIFY) 5 MG tablet 1/2 po bid   . [DISCONTINUED] lamoTRIgine (LAMICTAL) 100 MG tablet Take 150 mg by mouth 2 (two) times daily.  02/04/2019: 100 mg BID  . [DISCONTINUED] pantoprazole (PROTONIX) 40 MG tablet TAKE 1 TABLET BY MOUTH EVERY DAY (Patient not taking: Reported on 06/04/2019)    No facility-administered encounter medications on file as of 06/04/2019.    ALLERGIES:  Allergies  Allergen Reactions  . Qvar [Beclomethasone] Shortness Of Breath    Patient states that this medication causes her to feel short of breath  . Codeine Nausea And Vomiting and Nausea Only  . Prozac [Fluoxetine  Hcl] Hives  . Ace Inhibitors Other (See Comments)  .  Erythromycin Ethylsuccinate Hives and Nausea Only  . Fluoxetine Hcl Other (See Comments)  . Hydrochlorothiazide Cough  . Lipitor [Atorvastatin Calcium] Other (See Comments)    Body aches, flu-like symptoms  . Other Other (See Comments)  . Triamterene   . Dyazide [Hydrochlorothiazide W-Triamterene] Cough  . Erythromycin Nausea Only  . Lisinopril Cough  . Pravastatin Other (See Comments)    BODY ACHES, FLU-LIKE SX.  Marland Kitchen Sulfa Antibiotics Rash  . Sulfonamide Derivatives Hives and Rash     PHYSICAL EXAM:  ECOG Performance status: 1  Vitals:   06/04/19 1056  BP: (!) 144/94  Pulse: (!) 106  Resp: 16  Temp: (!) 97.1 F (36.2 C)  SpO2: 97%   Filed Weights   06/04/19 1056  Weight: 207 lb (93.9 kg)   Physical Exam Constitutional:      Appearance: Normal appearance. She is normal weight.  Cardiovascular:     Rate and Rhythm: Normal rate and regular rhythm.     Heart sounds: Normal heart sounds.  Pulmonary:     Effort: Pulmonary effort is normal.     Breath sounds: Normal breath sounds.  Abdominal:     General: Bowel sounds are normal.     Palpations: Abdomen is soft.  Musculoskeletal:        General: Normal range of motion.  Skin:    General: Skin is warm and dry.  Neurological:     Mental Status: She is alert and oriented to person, place, and time. Mental status is at baseline.  Psychiatric:        Mood and Affect: Mood normal.        Behavior: Behavior normal.        Thought Content: Thought content normal.        Judgment: Judgment normal.      LABORATORY DATA:  I have reviewed the labs as listed.  CBC    Component Value Date/Time   WBC 6.2 05/29/2019 1020   RBC 5.49 (H) 05/29/2019 1020   HGB 15.9 (H) 05/29/2019 1020   HGB 12.2 02/17/2019 0859   HCT 48.0 (H) 05/29/2019 1020   HCT 37.6 02/17/2019 0859   PLT 274 05/29/2019 1020   PLT 339 02/17/2019 0859   MCV 87.4 05/29/2019 1020   MCV 80 02/17/2019  0859   MCH 29.0 05/29/2019 1020   MCHC 33.1 05/29/2019 1020   RDW 15.2 05/29/2019 1020   RDW 13.5 02/17/2019 0859   LYMPHSABS 1.4 05/29/2019 1020   LYMPHSABS 2.1 02/17/2019 0859   MONOABS 0.6 05/29/2019 1020   EOSABS 0.2 05/29/2019 1020   EOSABS 0.2 02/17/2019 0859   BASOSABS 0.0 05/29/2019 1020   BASOSABS 0.1 02/17/2019 0859   CMP Latest Ref Rng & Units 05/29/2019 03/26/2019 02/17/2019  Glucose 70 - 99 mg/dL 337(H) 185(H) 138(H)  BUN 6 - 20 mg/dL 8 11 10   Creatinine 0.44 - 1.00 mg/dL 0.79 0.68 0.63  Sodium 135 - 145 mmol/L 136 137 140  Potassium 3.5 - 5.1 mmol/L 4.0 4.4 4.4  Chloride 98 - 111 mmol/L 101 102 102  CO2 22 - 32 mmol/L 25 24 25   Calcium 8.9 - 10.3 mg/dL 9.0 9.0 9.0  Total Protein 6.5 - 8.1 g/dL 7.3 7.1 6.3  Total Bilirubin 0.3 - 1.2 mg/dL 0.4 0.4 0.3  Alkaline Phos 38 - 126 U/L 135(H) 135(H) 148(H)  AST 15 - 41 U/L 25 18 17   ALT 0 - 44 U/L 26 20 12     All questions were answered to patient's  stated satisfaction. Encouraged patient to call with any new concerns or questions before his next visit to the cancer center and we can certain see him sooner, if needed.     ASSESSMENT & PLAN:  Iron deficiency anemia 1.  Severe iron deficiency state: -She had a history of gastric bypass done 2015. -Ferritin was severely low on 02/17/2019 at 8. -Colonoscopy in October 2014 showed mild colonic diverticulosis with normal exam. -Ultrasound of the abdomen in July 2014 showed normal spleen and liver. -Denies any bright red bleeding per rectum or melena. -She has been taking iron tablets intermittently.  Complains of severe tiredness. -Last IV Feraheme was given on 03/28/2019 and 04/04/2019 -She did feel some energy improvement with the IV iron. -Her labs on 05/29/2019 showed her hemoglobin 15.7, ferritin 59, percent saturation 24 -I do not recommend any IV iron at this time. -She will follow-up in 3 months with repeat labs.  2.  Family history: -Mother had cervical cancer. -Father  had bladder cancer. -Maternal aunt had throat cancer. -Maternal aunt had breast cancer.     Orders placed this encounter:  Orders Placed This Encounter  Procedures  . Lactate dehydrogenase  . CBC with Differential/Platelet  . Comprehensive metabolic panel  . Ferritin  . Iron and TIBC  . Vitamin B12  . VITAMIN D 25 Hydroxy (Vit-D Deficiency, Fractures)  . Folate  . Copper, serum      Francene Finders, FNP-C [facility] [phone]

## 2019-06-04 NOTE — Patient Instructions (Signed)
[facility] at [facility] Discharge Instructions  Follow up in 3 months with lab s   Thank you for choosing [facility] at [facility] to provide your oncology and hematology care.  To afford each patient quality time with our provider, please arrive at least 15 minutes before your scheduled appointment time.   If you have a lab appointment with the Cancer Center please come in thru the Main Entrance and check in at the main information desk.  You need to re-schedule your appointment should you arrive 10 or more minutes late.  We strive to give you quality time with our providers, and arriving late affects you and other patients whose appointments are after yours.  Also, if you no show three or more times for appointments you may be dismissed from the clinic at the providers discretion.     Again, thank you for choosing [facility].  Our hope is that these requests will decrease the amount of time that you wait before being seen by our physicians.       _____________________________________________________________  Should you have questions after your visit to [facility], please contact our office at [phone] between the hours of 8:00 a.m. and 4:30 p.m.  Voicemails left after 4:00 p.m. will not be returned until the following business day.  For prescription refill requests, have your pharmacy contact our office and allow 72 hours.    Due to Covid, you will need to wear a mask upon entering the hospital. If you do not have a mask, a mask will be given to you at the Main Entrance upon arrival. For doctor visits, patients may have 1 support person with them. For treatment visits, patients can not have anyone with them due to social distancing guidelines and our immunocompromised population.      

## 2019-06-18 MED ORDER — ERGOCALCIFEROL 1.25 MG (50000 UT) PO CAPS: 50000.0000 [IU] | ORAL_CAPSULE | ORAL | 3 refills | Status: DC

## 2019-06-18 NOTE — Addendum Note (Signed)
Addended by: Glennie Isle on: 06/18/2019 09:09 AM   Modules accepted: Orders

## 2019-06-26 ENCOUNTER — Telehealth: Payer: Self-pay | Admitting: Orthopedic Surgery

## 2019-06-26 NOTE — Telephone Encounter (Signed)
Carrie Cook called this afternoon and stated that she has been cleared for surgery.   She wants to know if she needs to come in before scheduling surgery or if you can proceed without  An office visit.  Please advise  Thanks

## 2019-06-27 NOTE — Telephone Encounter (Addendum)
Right total knee I will call her to RS  I need the clearance letter too

## 2019-06-30 ENCOUNTER — Ambulatory Visit: Payer: Medicare Other | Admitting: Skilled Nursing Facility1

## 2019-06-30 ENCOUNTER — Other Ambulatory Visit: Payer: Self-pay | Admitting: Orthopedic Surgery

## 2019-06-30 NOTE — Telephone Encounter (Signed)
Put on schedule for June 29th, her labs have improved/ to you FYI    Previous TKR cancelled due to low ferritin/ iron   Recent Results (from the past 2160 hour(s))  CBC with Differential     Status: Abnormal   Collection Time: 05/29/19 10:20 AM  Result Value Ref Range   WBC 6.2 4.0 - 10.5 K/uL   RBC 5.49 (H) 3.87 - 5.11 MIL/uL   Hemoglobin 15.9 (H) 12.0 - 15.0 g/dL   HCT 48.0 (H) 36.0 - 46.0 %   MCV 87.4 80.0 - 100.0 fL   MCH 29.0 26.0 - 34.0 pg   MCHC 33.1 30.0 - 36.0 g/dL   RDW 15.2 11.5 - 15.5 %   Platelets 274 150 - 400 K/uL   nRBC 0.0 0.0 - 0.2 %   Neutrophils Relative % 65 %   Neutro Abs 4.1 1.7 - 7.7 K/uL   Lymphocytes Relative 22 %   Lymphs Abs 1.4 0.7 - 4.0 K/uL   Monocytes Relative 9 %   Monocytes Absolute 0.6 0.1 - 1.0 K/uL   Eosinophils Relative 3 %   Eosinophils Absolute 0.2 0.0 - 0.5 K/uL   Basophils Relative 1 %   Basophils Absolute 0.0 0.0 - 0.1 K/uL   Immature Granulocytes 0 %   Abs Immature Granulocytes 0.02 0.00 - 0.07 K/uL    Comment: Performed at [facility], [address], [place], [place] [address]  Comprehensive metabolic panel     Status: Abnormal   Collection Time: 05/29/19 10:20 AM  Result Value Ref Range   Sodium 136 135 - 145 mmol/L   Potassium 4.0 3.5 - 5.1 mmol/L   Chloride 101 98 - 111 mmol/L   CO2 25 22 - 32 mmol/L   Glucose, Bld 337 (H) 70 - 99 mg/dL    Comment: Glucose reference range applies only to samples taken after fasting for at least 8 hours.   BUN 8 6 - 20 mg/dL   Creatinine, Ser 0.79 0.44 - 1.00 mg/dL   Calcium 9.0 8.9 - 10.3 mg/dL   Total Protein 7.3 6.5 - 8.1 g/dL   Albumin 4.0 3.5 - 5.0 g/dL   AST 25 15 - 41 U/L   ALT 26 0 - 44 U/L   Alkaline Phosphatase 135 (H) 38 - 126 U/L   Total Bilirubin 0.4 0.3 - 1.2 mg/dL   GFR calc non Af Amer >60 >60 mL/min   GFR calc Af Amer >60 >60 mL/min   Anion gap 10 5 - 15    Comment: Performed at [facility], [address] [place], [place] [address]  Lactate dehydrogenase      Status: None   Collection Time: 05/29/19 10:20 AM  Result Value Ref Range   LDH 128 98 - 192 U/L    Comment: Performed at [facility], [address] [place], [place] [address]  Methylmalonic acid, serum     Status: None   Collection Time: 05/29/19 10:20 AM  Result Value Ref Range   Methylmalonic Acid, Quantitative 158 0 - 378 nmol/L   Disclaimer: Comment     Comment: (NOTE) This test was developed and its performance characteristics determined by [facility]. It has not been cleared or approved by the Food and Drug Administration. Performed At: [facility] [place], [place] [id number] Rush Farmer MD [id number]   Folate     Status: None   Collection Time: 05/29/19 10:20 AM  Result Value Ref Range   Folate 20.4 >5.9 ng/mL  Comment: Performed at [facility], [address], [place], [place] [address]  Vitamin B12     Status: None   Collection Time: 05/29/19 10:20 AM  Result Value Ref Range   Vitamin B-12 618 180 - 914 pg/mL    Comment: (NOTE) This assay is not validated for testing neonatal or myeloproliferative syndrome specimens for Vitamin B12 levels. Performed at [facility], [address] [place], [place] [address]   Iron and TIBC     Status: None   Collection Time: 05/29/19 10:20 AM  Result Value Ref Range   Iron 66 28 - 170 ug/dL   TIBC 273 250 - 450 ug/dL   Saturation Ratios 24 10.4 - 31.8 %   UIBC 207 ug/dL    Comment: Performed at [facility], [address] [place], [place] [address]  Ferritin     Status: None   Collection Time: 05/29/19 10:20 AM  Result Value Ref Range   Ferritin 59 11 - 307 ng/mL    Comment: Performed at [facility], [address] [place], [place] [address]  Vitamin D 25 hydroxy     Status: Abnormal   Collection Time: 05/29/19 10:20 AM  Result Value Ref Range   Vit D, 25-Hydroxy 19.87 (L) 30 - 100 ng/mL    Comment: (NOTE) Vitamin D deficiency has been defined by the Institute of Medicine  and an Endocrine  Society practice guideline as a level of serum 25-OH  vitamin D less than 20 ng/mL (1,2). The Endocrine Society went on to  further define vitamin D insufficiency as a level between 21 and 29  ng/mL (2). 1. IOM (Institute of Medicine). 2010. Dietary reference intakes for  calcium and D. [place]: The [employer]. 2. Holick MF, Binkley Crowley, Bischoff-Ferrari HA, et al. Evaluation,  treatment, and prevention of vitamin D deficiency: an Endocrine  Society clinical practice guideline, JCEM. 2011 Jul; 96(7): 1911-30. Performed at [facility], [place] [address] [place], [place] [address]

## 2019-07-09 ENCOUNTER — Ambulatory Visit: Payer: Medicare Other | Admitting: Gastroenterology

## 2019-07-09 ENCOUNTER — Other Ambulatory Visit: Payer: Self-pay | Admitting: *Deleted

## 2019-07-09 DIAGNOSIS — F341 Dysthymic disorder: Secondary | ICD-10-CM | POA: Diagnosis not present

## 2019-07-10 ENCOUNTER — Other Ambulatory Visit: Payer: Self-pay | Admitting: Obstetrics & Gynecology

## 2019-07-10 MED ORDER — ESTRADIOL 2 MG PO TABS: 2.0000 mg | ORAL_TABLET | Freq: Every day | ORAL | 3 refills | Status: DC

## 2019-07-10 NOTE — Progress Notes (Signed)
refilled 

## 2019-07-14 NOTE — Patient Instructions (Signed)
Carrie Cook  07/14/2019     @PREFPERIOPPHARMACY @   Your procedure is scheduled on  07/22/2019 .  Report to [facility] at  [phone]  A.M.  Call this number if you have problems the morning of surgery:  [phone]   Remember:  Do not eat or drink after midnight.                       Take these medicines the morning of surgery with A SIP OF WATER  Amlodipine, zyrtec, lamictal, effexor. Use your inhaler before you come. Take 1/2 of your night time insulin dose the night before your procedure. DO NOT take any medications for diabetes the morning of your procedure.    Do not wear jewelry, make-up or nail polish.  Do not wear lotions, powders, or perfumes. Please wear deodorant and brush your teeth.  Do not shave 48 hours prior to surgery.  Men may shave face and neck.  Do not bring valuables to the hospital.  [name] is not responsible for any belongings or valuables.  Contacts, dentures or bridgework may not be worn into surgery.  Leave your suitcase in the car.  After surgery it may be brought to your room.  For patients admitted to the hospital, discharge time will be determined by your treatment team.  Patients discharged the day of surgery will not be allowed to drive home.   Name and phone number of your driver:   family Special instructions:  DO NOT smoke the morning of your procedure.  Please read over the following fact sheets that you were given. Pain Booklet, Coughing and Deep Breathing, Blood Transfusion Information, Total Joint Packet, MRSA Information, Surgical Site Infection Prevention, Anesthesia Post-op Instructions and Care and Recovery After Surgery       Total Knee Replacement, Care After This sheet gives you information about how to care for yourself after your procedure. Your health care provider may also give you more specific instructions. If you have problems or questions, contact your health care provider. What can I expect after  the procedure? After the procedure, it is common to have:  Pain.  Swelling.  A small amount of blood or clear fluid coming from your incision.  Limited range of motion. Follow these instructions at home: Medicines  Take over-the-counter and prescription medicines only as told by your health care provider.  If you were prescribed a blood thinner (anticoagulant), take it as told by your health care provider.  Ask your health care provider if the medicine prescribed to you: ? Requires you to avoid driving or using heavy machinery. ? Can cause constipation. You may need to take actions to prevent or treat constipation, such as:  Drink enough fluid to keep your urine pale yellow.  Take over-the-counter or prescription medicines.  Eat foods that are high in fiber, such as beans, whole grains, and fresh fruits and vegetables.  Limit foods that are high in fat and processed sugars, such as fried or sweet foods. Bathing  Do not take baths, swim, or use a hot tub until your health care provider approves. Ask your health care provider if you may take showers. You may only be allowed to take sponge baths.  Keep your bandage (dressing) dry until your health care provider says it can be removed. Incision care and drain care   Follow instructions from your health care provider about how to  take care of your incision. Make sure you: ? Wash your hands with soap and water before and after you change your dressing. If soap and water are not available, use hand sanitizer. ? Change your dressing as told by your health care provider. ? Leave stitches (sutures), skin glue, or adhesive strips in place. These skin closures may need to stay in place for 2 weeks or longer. If adhesive strip edges start to loosen and curl up, you may trim the loose edges. Do not remove adhesive strips completely unless your health care provider tells you to do that.  Check your incision area and drain site every day for  signs of infection. Check for: ? More redness, swelling, or pain. ? More fluid or blood. ? Warmth. ? Pus or a bad smell.  If you have a drain, follow instructions from your health care provider about caring for it. Managing pain, stiffness, and swelling      If directed, put ice on your knee. ? Put ice in a plastic bag or use the icing device (cold flow pad or cryocuff) that you were given. Follow instructions from your health care provider about how to use the icing device. ? Place a towel between your skin and the bag or between your skin and the icing device. ? Leave the ice on for 20 minutes, 2-3 times per day.  If directed, apply heat to the affected area before you exercise. Use the heat source that your health care provider recommends, such as a moist heat pack or a heating pad. ? Place a towel between your skin and the heat source. ? Leave the heat on for 20-30 minutes. ? Remove the heat if your skin turns bright red. This is especially important if you are unable to feel pain, heat, or cold. You may have a greater risk of getting burned.  Move your toes often to avoid stiffness and to lessen swelling.  Raise (elevate) your leg above the level of your heart while you are sitting or lying down. ? Use several pillows to keep your leg straight. ? Do not put a pillow just under the knee. If the knee is bent for a long time, this may lead to stiffness.  Wear elastic knee support as told by your health care provider. Activity  Rest as told by your health care provider.  Avoid sitting for a long time without moving. Get up to take short walks every 1-2 hours. This is important to improve blood flow and breathing. Ask for help if you feel weak or unsteady.  Ask your health care provider what activities are safe for you.  Avoid high-impact activities, including running, jumping rope, and jumping jacks.  Do not play contact sports until your health care provider approves.  Do  exercises as told by your physical therapist.  If you have been sent home with a continuous passive motion machine, use it as told by your health care provider. Safety   Do not use your leg to support your body weight until your health care provider approves. Use crutches or a walker as told by your health care provider.  Do not drive until your health care provider approves. Ask your health care provider when it is safe to drive. General instructions  Do not use any products that contain nicotine or tobacco, such as cigarettes, e-cigarettes, and chewing tobacco. These can delay healing after surgery. If you need help quitting, ask your health care provider.  Wear compression stockings  as told by your health care provider.  Tell your health care provider if you plan to have dental work. Also: ? Tell your dentist about your joint replacement. ? Ask your health care provider if there are any special instructions you need to follow before having dental care and routine cleanings.  Keep all follow-up visits as told by your health care provider. This is important. Contact a health care provider if you have:  More redness, swelling, or pain around your incision or drain.  More fluid or blood coming from your incision or drain.  Pus or a bad smell coming from your incision or drain.  Warmth on your incision or drain site.  A fever.  An incision that breaks open.  Knee pain that does not go away.  Range of motion in your knee that is getting worse.  A prosthesis that feels loose. Get help right away if you have:  Pain or swelling in your calf or thigh.  Shortness of breath or difficulty breathing.  Chest pain. Summary  After the procedure, it is common to have pain and swelling, blood or fluid coming from your incision, and limited range of motion.  Follow instructions from your health care provider about how to take care of your incision.  Use crutches or a walker as told  by your health care provider.  If you were prescribed a blood thinner (anticoagulant), take it as told by your health care provider.  Keep all follow-up visits as told by your health care provider. This is important. This information is not intended to replace advice given to you by your health care provider. Make sure you discuss any questions you have with your health care provider. Document Revised: 05/20/2018 Document Reviewed: 08/23/2017 Elsevier Patient Education  [place].  Spinal Anesthesia and Epidural Anesthesia, Care After This sheet gives you information about how to care for yourself after your procedure. Your doctor may also give you more specific instructions. If you have problems or questions, call your doctor. Follow these instructions at home: For at least 24 hours after the procedure:   Have a responsible adult stay with you. It is important to have someone help care for you until you are awake and alert.  Rest as needed.  Do not do activities where you could fall or get hurt (injured).  Do not drive.  Do not use heavy machinery.  Do not drink alcohol.  Do not take sleeping pills or medicines that make you sleepy (drowsy).  Do not make important decisions.  Do not sign legal documents.  Do not take care of children on your own. Eating and drinking  If you throw up (vomit), drink water, juice, or soup when nausea and vomiting stop.  Drink enough fluid to keep your pee (urine) pale yellow.  Make sure you do not feel like throwing up (nauseous) before you eat solid foods.  Follow the diet that your doctor recommends. General instructions  Return to your normal activities as told by your doctor. Ask your doctor what activities are safe for you.  Take over-the-counter and prescription medicines only as told by your doctor.  If you have sleep apnea, surgery and certain medicines can raise your risk for breathing problems. Follow instructions  from your doctor about when to wear your sleep device. Your doctor may tell you to wear your sleep device: ? Anytime you are sleeping, including during daytime naps. ? While taking prescription pain medicines, sleeping pills, or medicines that  make you sleepy.  Do not use any products that contain nicotine or tobacco. This includes cigarettes and e-cigarettes. ? If you need help quitting, ask your doctor. ? If you smoke, do not smoke by yourself. Make sure someone is nearby in case you need help.  Keep all follow-up visits as told by your doctor. This is important. Contact a doctor if:  It has been more than one day since your procedure and you feel like throwing up.  It has been more than one day since your procedure and you throw up.  You have a rash. Get help right away if:  You have a fever.  You have a headache that lasts a long time.  You have a very bad headache.  Your vision is blurry.  You see two of a single object (double vision).  You are dizzy or light-headed.  You faint.  Your arms or legs tingle, feel weak, or get numb.  You have trouble breathing.  You cannot pee (urinate). Summary  After the procedure, have a responsible adult stay with you at home until you are fully awake and alert.  Do not do activities that might get you injured. Do not drive, use heavy machinery, drink alcohol, or make important decisions for 24 hours after the procedure.  Take medicines as told by your doctor. Do not use products that contain nicotine or tobacco.  Get help right away if you have a fever, blurry vision, difficulty breathing or passing urine, or weakness or numbness in arms or legs. This information is not intended to replace advice given to you by your health care provider. Make sure you discuss any questions you have with your health care provider. Document Revised: 12/22/2016 Document Reviewed: 05/03/2015 Elsevier Patient Education  2020 [place] Anesthesia, Adult, Care After This sheet gives you information about how to care for yourself after your procedure. Your health care provider may also give you more specific instructions. If you have problems or questions, contact your health care provider. What can I expect after the procedure? After the procedure, the following side effects are common:  Pain or discomfort at the IV site.  Nausea.  Vomiting.  Sore throat.  Trouble concentrating.  Feeling cold or chills.  Weak or tired.  Sleepiness and fatigue.  Soreness and body aches. These side effects can affect parts of the body that were not involved in surgery. Follow these instructions at home:  For at least 24 hours after the procedure:  Have a responsible adult stay with you. It is important to have someone help care for you until you are awake and alert.  Rest as needed.  Do not: ? Participate in activities in which you could fall or become injured. ? Drive. ? Use heavy machinery. ? Drink alcohol. ? Take sleeping pills or medicines that cause drowsiness. ? Make important decisions or sign legal documents. ? Take care of children on your own. Eating and drinking  Follow any instructions from your health care provider about eating or drinking restrictions.  When you feel hungry, start by eating small amounts of foods that are soft and easy to digest (bland), such as toast. Gradually return to your regular diet.  Drink enough fluid to keep your urine pale yellow.  If you vomit, rehydrate by drinking water, juice, or clear broth. General instructions  If you have sleep apnea, surgery and certain medicines can increase your risk for breathing problems. Follow instructions from your health care  provider about wearing your sleep device: ? Anytime you are sleeping, including during daytime naps. ? While taking prescription pain medicines, sleeping medicines, or medicines that make you  drowsy.  Return to your normal activities as told by your health care provider. Ask your health care provider what activities are safe for you.  Take over-the-counter and prescription medicines only as told by your health care provider.  If you smoke, do not smoke without supervision.  Keep all follow-up visits as told by your health care provider. This is important. Contact a health care provider if:  You have nausea or vomiting that does not get better with medicine.  You cannot eat or drink without vomiting.  You have pain that does not get better with medicine.  You are unable to pass urine.  You develop a skin rash.  You have a fever.  You have redness around your IV site that gets worse. Get help right away if:  You have difficulty breathing.  You have chest pain.  You have blood in your urine or stool, or you vomit blood. Summary  After the procedure, it is common to have a sore throat or nausea. It is also common to feel tired.  Have a responsible adult stay with you for the first 24 hours after general anesthesia. It is important to have someone help care for you until you are awake and alert.  When you feel hungry, start by eating small amounts of foods that are soft and easy to digest (bland), such as toast. Gradually return to your regular diet.  Drink enough fluid to keep your urine pale yellow.  Return to your normal activities as told by your health care provider. Ask your health care provider what activities are safe for you. This information is not intended to replace advice given to you by your health care provider. Make sure you discuss any questions you have with your health care provider. Document Revised: 01/12/2017 Document Reviewed: 08/25/2016 Elsevier Patient Education  [place]. How to Use Chlorhexidine for Bathing Chlorhexidine gluconate (CHG) is a germ-killing (antiseptic) solution that is used to clean the skin. It can get rid of  the bacteria that normally live on the skin and can keep them away for about 24 hours. To clean your skin with CHG, you may be given:  A CHG solution to use in the shower or as part of a sponge bath.  A prepackaged cloth that contains CHG. Cleaning your skin with CHG may help lower the risk for infection:  While you are staying in the intensive care unit of the hospital.  If you have a vascular access, such as a central line, to provide short-term or long-term access to your veins.  If you have a catheter to drain urine from your bladder.  If you are on a ventilator. A ventilator is a machine that helps you breathe by moving air in and out of your lungs.  After surgery. What are the risks? Risks of using CHG include:  A skin reaction.  Hearing loss, if CHG gets in your ears.  Eye injury, if CHG gets in your eyes and is not rinsed out.  The CHG product catching fire. Make sure that you avoid smoking and flames after applying CHG to your skin. Do not use CHG:  If you have a chlorhexidine allergy or have previously reacted to chlorhexidine.  On babies younger than 34 months of age. How to use CHG solution  Use CHG only  as told by your health care provider, and follow the instructions on the label.  Use the full amount of CHG as directed. Usually, this is one bottle. During a shower Follow these steps when using CHG solution during a shower (unless your health care provider gives you different instructions): 1. Start the shower. 2. Use your normal soap and shampoo to wash your face and hair. 3. Turn off the shower or move out of the shower stream. 4. Pour the CHG onto a clean washcloth. Do not use any type of brush or rough-edged sponge. 5. Starting at your neck, lather your body down to your toes. Make sure you follow these instructions: ? If you will be having surgery, pay special attention to the part of your body where you will be having surgery. Scrub this area for at  least 1 minute. ? Do not use CHG on your head or face. If the solution gets into your ears or eyes, rinse them well with water. ? Avoid your genital area. ? Avoid any areas of skin that have broken skin, cuts, or scrapes. ? Scrub your back and under your arms. Make sure to wash skin folds. 6. Let the lather sit on your skin for 1-2 minutes or as long as told by your health care provider. 7. Thoroughly rinse your entire body in the shower. Make sure that all body creases and crevices are rinsed well. 8. Dry off with a clean towel. Do not put any substances on your body afterward--such as powder, lotion, or perfume--unless you are told to do so by your health care provider. Only use lotions that are recommended by the manufacturer. 9. Put on clean clothes or pajamas. 10. If it is the night before your surgery, sleep in clean sheets.  During a sponge bath Follow these steps when using CHG solution during a sponge bath (unless your health care provider gives you different instructions): 1. Use your normal soap and shampoo to wash your face and hair. 2. Pour the CHG onto a clean washcloth. 3. Starting at your neck, lather your body down to your toes. Make sure you follow these instructions: ? If you will be having surgery, pay special attention to the part of your body where you will be having surgery. Scrub this area for at least 1 minute. ? Do not use CHG on your head or face. If the solution gets into your ears or eyes, rinse them well with water. ? Avoid your genital area. ? Avoid any areas of skin that have broken skin, cuts, or scrapes. ? Scrub your back and under your arms. Make sure to wash skin folds. 4. Let the lather sit on your skin for 1-2 minutes or as long as told by your health care provider. 5. Using a different clean, wet washcloth, thoroughly rinse your entire body. Make sure that all body creases and crevices are rinsed well. 6. Dry off with a clean towel. Do not put any  substances on your body afterward--such as powder, lotion, or perfume--unless you are told to do so by your health care provider. Only use lotions that are recommended by the manufacturer. 7. Put on clean clothes or pajamas. 8. If it is the night before your surgery, sleep in clean sheets. How to use CHG prepackaged cloths  Only use CHG cloths as told by your health care provider, and follow the instructions on the label.  Use the CHG cloth on clean, dry skin.  Do not use the CHG  cloth on your head or face unless your health care provider tells you to.  When washing with the CHG cloth: ? Avoid your genital area. ? Avoid any areas of skin that have broken skin, cuts, or scrapes. Before surgery Follow these steps when using a CHG cloth to clean before surgery (unless your health care provider gives you different instructions): 1. Using the CHG cloth, vigorously scrub the part of your body where you will be having surgery. Scrub using a back-and-forth motion for 3 minutes. The area on your body should be completely wet with CHG when you are done scrubbing. 2. Do not rinse. Discard the cloth and let the area air-dry. Do not put any substances on the area afterward, such as powder, lotion, or perfume. 3. Put on clean clothes or pajamas. 4. If it is the night before your surgery, sleep in clean sheets.  For general bathing Follow these steps when using CHG cloths for general bathing (unless your health care provider gives you different instructions). 1. Use a separate CHG cloth for each area of your body. Make sure you wash between any folds of skin and between your fingers and toes. Wash your body in the following order, switching to a new cloth after each step: ? The front of your neck, shoulders, and chest. ? Both of your arms, under your arms, and your hands. ? Your stomach and groin area, avoiding the genitals. ? Your right leg and foot. ? Your left leg and foot. ? The back of your neck,  your back, and your buttocks. 2. Do not rinse. Discard the cloth and let the area air-dry. Do not put any substances on your body afterward--such as powder, lotion, or perfume--unless you are told to do so by your health care provider. Only use lotions that are recommended by the manufacturer. 3. Put on clean clothes or pajamas. Contact a health care provider if:  Your skin gets irritated after scrubbing.  You have questions about using your solution or cloth. Get help right away if:  Your eyes become very red or swollen.  Your eyes itch badly.  Your skin itches badly and is red or swollen.  Your hearing changes.  You have trouble seeing.  You have swelling or tingling in your mouth or throat.  You have trouble breathing.  You swallow any chlorhexidine. Summary  Chlorhexidine gluconate (CHG) is a germ-killing (antiseptic) solution that is used to clean the skin. Cleaning your skin with CHG may help to lower your risk for infection.  You may be given CHG to use for bathing. It may be in a bottle or in a prepackaged cloth to use on your skin. Carefully follow your health care provider's instructions and the instructions on the product label.  Do not use CHG if you have a chlorhexidine allergy.  Contact your health care provider if your skin gets irritated after scrubbing. This information is not intended to replace advice given to you by your health care provider. Make sure you discuss any questions you have with your health care provider. Document Revised: 03/28/2018 Document Reviewed: 12/07/2016 Elsevier Patient Education  [place].

## 2019-07-15 ENCOUNTER — Other Ambulatory Visit: Payer: Self-pay | Admitting: Orthopedic Surgery

## 2019-07-15 DIAGNOSIS — M1711 Unilateral primary osteoarthritis, right knee: Secondary | ICD-10-CM

## 2019-07-18 ENCOUNTER — Other Ambulatory Visit (HOSPITAL_COMMUNITY)
Admission: RE | Admit: 2019-07-18 | Discharge: 2019-07-18 | Disposition: A | Discharge status: Home / Self Care | Payer: Medicare Other | Source: Ambulatory Visit | Attending: Orthopedic Surgery | Admitting: Orthopedic Surgery

## 2019-07-18 ENCOUNTER — Encounter (HOSPITAL_COMMUNITY)
Admission: RE | Admit: 2019-07-18 | Discharge: 2019-07-18 | Disposition: A | Discharge status: Home / Self Care | Payer: Medicare Other | Source: Ambulatory Visit | Attending: Orthopedic Surgery | Admitting: Orthopedic Surgery

## 2019-07-18 ENCOUNTER — Encounter (HOSPITAL_COMMUNITY): Payer: Self-pay

## 2019-07-18 ENCOUNTER — Other Ambulatory Visit: Payer: Self-pay

## 2019-07-18 DIAGNOSIS — Z01818 Encounter for other preprocedural examination: Secondary | ICD-10-CM | POA: Diagnosis not present

## 2019-07-18 DIAGNOSIS — Z20822 Contact with and (suspected) exposure to covid-19: Secondary | ICD-10-CM | POA: Diagnosis not present

## 2019-07-18 LAB — BASIC METABOLIC PANEL
Anion gap: 10 (ref 5–15)
BUN: 14 mg/dL (ref 6–20)
CO2: 25 mmol/L (ref 22–32)
Calcium: 9 mg/dL (ref 8.9–10.3)
Chloride: 101 mmol/L (ref 98–111)
Creatinine, Ser: 0.66 mg/dL (ref 0.44–1.00)
GFR calc Af Amer: >60 mL/min (ref >60)
GFR calc non Af Amer: >60 mL/min (ref >60)
Glucose, Bld: 247 mg/dL — ABNORMAL HIGH (ref 70–99)
Potassium: 4.3 mmol/L (ref 3.5–5.1)
Sodium: 136 mmol/L (ref 135–145)

## 2019-07-18 LAB — CBC WITH DIFFERENTIAL/PLATELET
Abs Immature Granulocytes: 0.03 10*3/uL (ref 0.00–0.07)
Basophils Absolute: 0 10*3/uL (ref 0.0–0.1)
Basophils Relative: 0 %
Eosinophils Absolute: 0.1 10*3/uL (ref 0.0–0.5)
Eosinophils Relative: 2 %
HCT: 45.1 % (ref 36.0–46.0)
Hemoglobin: 15.4 g/dL — ABNORMAL HIGH (ref 12.0–15.0)
Immature Granulocytes: 0 %
Lymphocytes Relative: 20 %
Lymphs Abs: 1.8 10*3/uL (ref 0.7–4.0)
MCH: 30.5 pg (ref 26.0–34.0)
MCHC: 34.1 g/dL (ref 30.0–36.0)
MCV: 89.3 fL (ref 80.0–100.0)
Monocytes Absolute: 0.7 10*3/uL (ref 0.1–1.0)
Monocytes Relative: 8 %
Neutro Abs: 6.3 10*3/uL (ref 1.7–7.7)
Neutrophils Relative %: 70 %
Platelets: 285 10*3/uL (ref 150–400)
RBC: 5.05 MIL/uL (ref 3.87–5.11)
RDW: 13.1 % (ref 11.5–15.5)
WBC: 8.9 10*3/uL (ref 4.0–10.5)
nRBC: 0 % (ref 0.0–0.2)

## 2019-07-18 LAB — TYPE AND SCREEN
ABO/RH(D): O POS
Antibody Screen: NEGATIVE

## 2019-07-18 LAB — PREPARE RBC (CROSSMATCH)

## 2019-07-19 LAB — SARS CORONAVIRUS 2 (TAT 6-24 HRS): SARS Coronavirus 2: NEGATIVE

## 2019-07-21 ENCOUNTER — Other Ambulatory Visit: Payer: Self-pay

## 2019-07-21 ENCOUNTER — Other Ambulatory Visit (HOSPITAL_COMMUNITY)
Admission: RE | Admit: 2019-07-21 | Discharge: 2019-07-21 | Disposition: A | Discharge status: Home / Self Care | Payer: Medicare Other | Source: Ambulatory Visit | Attending: Orthopedic Surgery | Admitting: Orthopedic Surgery

## 2019-07-21 DIAGNOSIS — Z794 Long term (current) use of insulin: Secondary | ICD-10-CM | POA: Insufficient documentation

## 2019-07-21 DIAGNOSIS — E118 Type 2 diabetes mellitus with unspecified complications: Secondary | ICD-10-CM | POA: Diagnosis not present

## 2019-07-21 LAB — HEMOGLOBIN A1C
Hgb A1c MFr Bld: 7.3 % — ABNORMAL HIGH (ref 4.8–5.6)
Mean Plasma Glucose: 162.81 mg/dL

## 2019-07-21 NOTE — H&P (Signed)
HISTORY SECTION :       Chief Complaint  Patient presents with  . Knee Pain      right     Carrie Cook is 59 years old she had a previous knee arthroscopy back in 2017 and had grade 4 chondral changes of the medial femoral condyle and required a meniscectomy.   She presents now with 2 to 3-year history of progressively increasing knee pain difficulty climbing the steps and weightbearing discomfort.   She has been taking care of her sick relatives and has been doing a lot of walking and extra shopping for them and has noticed that the pain has worsened as well.   Pain is rated moderate to severe nonradiating primarily over the medial compartment of the knee and the medial patellofemoral joint   She has a history of asthma diabetes with the most recent hemoglobin A1c of 6.9 as well as hypertension and she is followed by Dr. Wolfgang Phoenix   BMI is 89   Her husband is healthy lives at home she has 7 steps to get in the house no steps when she is in.       Review of Systems  Respiratory: Negative for shortness of breath.   Cardiovascular: Negative for chest pain.  Neurological: Negative for sensory change.  All other systems reviewed and are negative.      has a past medical history of Anemia, Anxiety, Asthma, Degenerative joint disease, Dental crowns present, Depression, Family history of anesthesia complication, Fibroids, Gastroesophageal reflux disease, H/O hiatal hernia, History of endometriosis, History of kidney stones, Hyperlipidemia, Hypertension, Insulin dependent diabetes mellitus, Lock jaw, Migraines, Neuropathy, Obesity, Palpitations, PONV (postoperative nausea and vomiting), Shortness of breath, and Sleep apnea.    Past Surgical History:  Procedure Laterality Date  . ABDOMINAL HYSTERECTOMY N/A 06/06/2017    Procedure: HYSTERECTOMY ABDOMINAL;  Surgeon: Florian Buff, MD;  Location: [facility];  Service: Gynecology;  Laterality: N/A;  . BREATH TEK H PYLORI N/A 08/13/2012    Procedure:  BREATH TEK H PYLORI;  Surgeon: Edward Jolly, MD;  Location: [facility];  Service: General;  Laterality: N/A;  . CARPAL TUNNEL RELEASE   01/03/2012    Procedure: CARPAL TUNNEL RELEASE;  Surgeon: Wynonia Sours, MD;  Location: [place];  Service: Orthopedics;  Laterality: Right;  . carpal tunnel release right hand Right 12/13  . COLONOSCOPY   05/2009    EPP:IRJJOACZ hemorrhoids otherwise normal colon, rectum and terminal ileum  . COLONOSCOPY WITH ESOPHAGOGASTRODUODENOSCOPY (EGD) N/A 10/28/2012    Procedure: COLONOSCOPY WITH ESOPHAGOGASTRODUODENOSCOPY (EGD);  Surgeon: Daneil Dolin, MD;  [facility];  Service: Endoscopy;  Laterality: N/A;  7:30  . DILITATION & CURRETTAGE/HYSTROSCOPY WITH NOVASURE ABLATION N/A 07/22/2014    Procedure: DILATATION & CURETTAGE/HYSTEROSCOPY WITH NOVASURE ABLATION; uterine length 6.0 cm; uterine width 3.8 cm; total ablation time 1 minute 15 seconds;  Surgeon: Florian Buff, MD;  Location: [facility];  Service: Gynecology;  Laterality: N/A;  . ESOPHAGOGASTRODUODENOSCOPY   5/013/2011    YSA:YTKZSW esophagus/multiple polyps removed s/p (hyperplastic). Gastritis without H.pylori.  . ESOPHAGOGASTRODUODENOSCOPY (EGD) WITH PROPOFOL N/A 06/06/2018    with LA Grade A esophagitis s/p dilation.   Marland Kitchen GASTRIC ROUX-EN-Y N/A 11/19/2012    Procedure: LAPAROSCOPIC ROUX-EN-Y GASTRIC;  Surgeon: Edward Jolly, MD;  Location: [facility];  Service: General;  Laterality: N/A;  . GIVENS CAPSULE STUDY N/A 10/28/2012    Procedure: GIVENS CAPSULE STUDY;  Surgeon: Daneil Dolin, MD;  [facility];  Service: Endoscopy;  Laterality: N/A;  . KNEE ARTHROSCOPY WITH MEDIAL MENISECTOMY Right 10/28/2015    Procedure: RIGHT KNEE ARTHROSCOPY WITH MEDIAL MENISECTOMY;  Surgeon: Carole Civil, MD;  Location: [facility];  Service: Orthopedics;  Laterality: Right;  . LAPAROSCOPY        due to endometriosis  . MALONEY DILATION N/A 06/06/2018    Procedure: Venia Minks DILATION;   Surgeon: Daneil Dolin, MD;  [facility]: [facility];  Service: Endoscopy;  Laterality: N/A;  . PELVIC LAPAROSCOPY   11/04/1999    with fulguration of endometriosis  . SALPINGOOPHORECTOMY Bilateral 06/06/2017    Procedure: SALPINGO OOPHORECTOMY;  Surgeon: Florian Buff, MD;  Location: [facility];  Service: Gynecology;  Laterality: Bilateral;  . TRIGGER FINGER RELEASE Right 09/24/2012    Procedure: RELEASE A-1 PULLEY OF RIGHT THUMB;  Surgeon: Wynonia Sours, MD;  Location: [place];  Service: Orthopedics;  Laterality: Right;  . TUBAL LIGATION   1993      Body mass index is 35.78 kg/m.          Allergies  Allergen Reactions  . Qvar [Beclomethasone] Shortness Of Breath      Patient states that this medication causes her to feel short of breath  . Codeine Nausea And Vomiting  . Prozac [Fluoxetine Hcl] Hives  . Lipitor [Atorvastatin Calcium] Other (See Comments)      Body aches, flu-like symptoms  . Dyazide [Hydrochlorothiazide W-Triamterene] Cough  . Erythromycin Nausea Only  . Lisinopril Cough  . Pravastatin Other (See Comments)      BODY ACHES, FLU-LIKE SX.  . Sulfonamide Derivatives Hives and Rash        Current Outpatient Medications:  .  albuterol (PROVENTIL HFA;VENTOLIN HFA) 108 (90 Base) MCG/ACT inhaler, Inhale 2 puffs into the lungs every 6 (six) hours as needed for wheezing., Disp: 1 Inhaler, Rfl: 6 .  ARIPiprazole (ABILIFY) 2 MG tablet, one po bid, Disp: , Rfl:  .  cetirizine (ZYRTEC) 10 MG tablet, Take 10 mg by mouth daily., Disp: , Rfl:  .  Cyanocobalamin (VITAMIN B-12) 1000 MCG SUBL, Place 1,000 mcg under the tongue daily., Disp: , Rfl:  .  estradiol (ESTRACE) 2 MG tablet, Take 1 tablet (2 mg total) by mouth at bedtime., Disp: 90 tablet, Rfl: 3 .  fluticasone (FLOVENT HFA) 220 MCG/ACT inhaler, Inhale 2 puffs into the lungs 2 (two) times daily. (Patient taking differently: Inhale 2 puffs into the lungs 2 (two) times daily as needed (shortness of breath). ),  Disp: 1 Inhaler, Rfl: 12 .  Insulin Glargine (LANTUS SOLOSTAR) 100 UNIT/ML Solostar Pen, Use 24 units qhs. May titrate to 30 units qhs (Patient taking differently: Inject 24 Units into the skin at bedtime. May titrate to 30 units qhs), Disp: 5 pen, Rfl: 5 .  Insulin Pen Needle (PEN NEEDLES) 32G X 4 MM MISC, 32 g by Does not apply route daily., Disp: 90 each, Rfl: 5 .  lamoTRIgine (LAMICTAL) 100 MG tablet, Take 150 mg by mouth 2 (two) times daily. , Disp: , Rfl: 3 .  losartan (COZAAR) 100 MG tablet, TAKE 1 TABLET BY MOUTH DAILY, Disp: 90 tablet, Rfl: 1 .  Melatonin 10 MG SUBL, Place 10 mg under the tongue at bedtime. , Disp: , Rfl:  .  metFORMIN (GLUCOPHAGE) 500 MG tablet, TAKE 1 TABLET(500 MG) BY MOUTH TWICE DAILY WITH A MEAL, Disp: 180 tablet, Rfl: 0 .  Multiple Vitamin (MULTIVITAMIN WITH MINERALS) TABS tablet, Take 1 tablet by mouth  daily. , Disp: , Rfl:  .  pantoprazole (PROTONIX) 40 MG tablet, TAKE 1 TABLET BY MOUTH EVERY DAY, Disp: 90 tablet, Rfl: 3 .  venlafaxine (EFFEXOR) 100 MG tablet, Take 100 mg by mouth 2 (two) times daily with a meal. , Disp: , Rfl: 1     PHYSICAL EXAM SECTION: 1) BP (!) 158/95   Pulse (!) 110   Ht 5\' 3"  (1.6 m)   Wt 202 lb (91.6 kg)   LMP 07/10/2013   BMI 35.78 kg/m   Body mass index is 35.78 kg/m. General appearance: Well-developed well-nourished no gross deformities  2) Cardiovascular normal pulse and perfusion in the lower extremities normal color without edema  3) Neurologically deep tendon reflexes are equal and normal, no sensation loss or deficits no pathologic reflexes   4) Psychological: Awake alert and oriented x3 mood and affect normal   5) Skin no lacerations or ulcerations no nodularity no palpable masses, no erythema or nodularity   6) Musculoskeletal:    Left knee is noted to be in full extension with full range of motion no tenderness or instability muscle tone and strength are normal skin is clean dry and intact without erythema    Right knee does not quite make full extension has excellent flexion of 130 degrees stable in all planes tender over the medial joint line and peripatellar structures medially with stable ligaments normal strength skin is warm dry tach without erythema     MEDICAL DECISION SECTION:      Encounter Diagnosis  Name Primary?  . Chronic pain of right knee Yes      Imaging See dictated report x-rays show varus knee with medial compartment narrowing: Grade 3 of knee   Plan:  (Rx., Inj., surg., Frx, MRI/CT, XR:2)   CONSENT ELEMENTS   1. Clinical issues (DX) primary osteoarthritis right knee 2. Options surgical versus continued nonoperative treatment 3. Pros and cons improved function and decreased pain risks associated with knee replacement 4. Uncertainty with outcomes not applicable 5. Assess px understanding yes 6. Discern/assess patient preference knee replacement surgery   Risks and benefits and complications explained including alternative treatments. The patient has opted for surgical treatment    We discussed with Ms. Cannady are scheduling issues related to Covid and bed placement.  We do expect 23-hour admission/discharge.   Right total knee

## 2019-07-22 ENCOUNTER — Other Ambulatory Visit: Payer: Self-pay

## 2019-07-22 ENCOUNTER — Encounter (HOSPITAL_COMMUNITY)
Admission: RE | Disposition: A | Discharge status: Home / Self Care | Payer: Self-pay | Source: Home / Self Care | Attending: Orthopedic Surgery

## 2019-07-22 ENCOUNTER — Encounter (HOSPITAL_COMMUNITY): Payer: Self-pay | Admitting: Orthopedic Surgery

## 2019-07-22 ENCOUNTER — Ambulatory Visit (HOSPITAL_COMMUNITY): Payer: Medicare Other | Admitting: Anesthesiology

## 2019-07-22 ENCOUNTER — Observation Stay (HOSPITAL_COMMUNITY)
Admission: RE | Admit: 2019-07-22 | Discharge: 2019-07-23 | Disposition: A | Discharge status: Home / Self Care | Payer: Medicare Other | Attending: Orthopedic Surgery | Admitting: Orthopedic Surgery

## 2019-07-22 ENCOUNTER — Ambulatory Visit (HOSPITAL_COMMUNITY): Payer: Medicare Other

## 2019-07-22 DIAGNOSIS — F329 Major depressive disorder, single episode, unspecified: Secondary | ICD-10-CM | POA: Insufficient documentation

## 2019-07-22 DIAGNOSIS — F419 Anxiety disorder, unspecified: Secondary | ICD-10-CM | POA: Insufficient documentation

## 2019-07-22 DIAGNOSIS — Z794 Long term (current) use of insulin: Secondary | ICD-10-CM | POA: Insufficient documentation

## 2019-07-22 DIAGNOSIS — M1711 Unilateral primary osteoarthritis, right knee: Principal | ICD-10-CM | POA: Insufficient documentation

## 2019-07-22 DIAGNOSIS — J45909 Unspecified asthma, uncomplicated: Secondary | ICD-10-CM | POA: Insufficient documentation

## 2019-07-22 DIAGNOSIS — Z96651 Presence of right artificial knee joint: Secondary | ICD-10-CM | POA: Diagnosis not present

## 2019-07-22 DIAGNOSIS — E119 Type 2 diabetes mellitus without complications: Secondary | ICD-10-CM | POA: Insufficient documentation

## 2019-07-22 DIAGNOSIS — Z79899 Other long term (current) drug therapy: Secondary | ICD-10-CM | POA: Insufficient documentation

## 2019-07-22 DIAGNOSIS — Z9884 Bariatric surgery status: Secondary | ICD-10-CM | POA: Diagnosis not present

## 2019-07-22 DIAGNOSIS — Z79890 Hormone replacement therapy (postmenopausal): Secondary | ICD-10-CM | POA: Insufficient documentation

## 2019-07-22 DIAGNOSIS — M1712 Unilateral primary osteoarthritis, left knee: Secondary | ICD-10-CM | POA: Diagnosis present

## 2019-07-22 DIAGNOSIS — K219 Gastro-esophageal reflux disease without esophagitis: Secondary | ICD-10-CM | POA: Diagnosis not present

## 2019-07-22 DIAGNOSIS — Z471 Aftercare following joint replacement surgery: Secondary | ICD-10-CM | POA: Diagnosis not present

## 2019-07-22 DIAGNOSIS — I10 Essential (primary) hypertension: Secondary | ICD-10-CM | POA: Insufficient documentation

## 2019-07-22 DIAGNOSIS — G8918 Other acute postprocedural pain: Secondary | ICD-10-CM | POA: Diagnosis not present

## 2019-07-22 DIAGNOSIS — G4730 Sleep apnea, unspecified: Secondary | ICD-10-CM | POA: Insufficient documentation

## 2019-07-22 HISTORY — PX: TOTAL KNEE ARTHROPLASTY: SHX125

## 2019-07-22 LAB — GLUCOSE, CAPILLARY
Glucose-Capillary: 156 mg/dL — ABNORMAL HIGH (ref 70–99)
Glucose-Capillary: 134 mg/dL — ABNORMAL HIGH (ref 70–99)
Glucose-Capillary: 294 mg/dL — ABNORMAL HIGH (ref 70–99)
Glucose-Capillary: 265 mg/dL — ABNORMAL HIGH (ref 70–99)

## 2019-07-22 SURGERY — ARTHROPLASTY, KNEE, TOTAL
Anesthesia: Spinal | Site: Knee | Laterality: Right

## 2019-07-22 MED ORDER — METHOCARBAMOL 500 MG PO TABS: 500.0000 mg | ORAL_TABLET | Freq: Four times a day (QID) | ORAL | Status: DC | PRN

## 2019-07-22 MED ORDER — PROPOFOL 10 MG/ML IV BOLUS
INTRAVENOUS | Status: AC
  Filled 2019-07-22: qty 20

## 2019-07-22 MED ORDER — DIPHENHYDRAMINE HCL 12.5 MG/5ML PO ELIX
12.5000 mg | ORAL_SOLUTION | ORAL | Status: DC | PRN
  Filled 2019-07-22: qty 10

## 2019-07-22 MED ORDER — GABAPENTIN 300 MG PO CAPS
300.0000 mg | ORAL_CAPSULE | Freq: Three times a day (TID) | ORAL | Status: DC
  Administered 2019-07-22 – 2019-07-23 (×3): 300 mg via ORAL
  Filled 2019-07-22 (×3): qty 1

## 2019-07-22 MED ORDER — LIDOCAINE 2% (20 MG/ML) 5 ML SYRINGE
INTRAMUSCULAR | Status: DC | PRN
  Administered 2019-07-22: 40 mg via INTRAVENOUS

## 2019-07-22 MED ORDER — INSULIN ASPART 100 UNIT/ML ~~LOC~~ SOLN
0.0000 [IU] | Freq: Three times a day (TID) | SUBCUTANEOUS | Status: DC
  Administered 2019-07-22: 5 [IU] via SUBCUTANEOUS
  Administered 2019-07-22: 1 [IU] via SUBCUTANEOUS
  Administered 2019-07-23: 3 [IU] via SUBCUTANEOUS
  Administered 2019-07-23: 2 [IU] via SUBCUTANEOUS
  Filled 2019-07-22: qty 0.09

## 2019-07-22 MED ORDER — INSULIN GLARGINE 100 UNIT/ML ~~LOC~~ SOLN
25.0000 [IU] | Freq: Every day | SUBCUTANEOUS | Status: DC
  Administered 2019-07-22: 25 [IU] via SUBCUTANEOUS
  Filled 2019-07-22 (×2): qty 0.25

## 2019-07-22 MED ORDER — OXYCODONE HCL 5 MG PO TABS
5.0000 mg | ORAL_TABLET | Freq: Once | ORAL | Status: AC
  Administered 2019-07-22: 5 mg via ORAL
  Filled 2019-07-22: qty 1

## 2019-07-22 MED ORDER — PROPOFOL 10 MG/ML IV BOLUS
INTRAVENOUS | Status: AC
  Filled 2019-07-22: qty 40

## 2019-07-22 MED ORDER — BUPIVACAINE LIPOSOME 1.3 % IJ SUSP
INTRAMUSCULAR | Status: AC
  Filled 2019-07-22: qty 20

## 2019-07-22 MED ORDER — CHLORHEXIDINE GLUCONATE 0.12 % MT SOLN
15.0000 mL | Freq: Once | OROMUCOSAL | Status: AC
  Administered 2019-07-22: 15 mL via OROMUCOSAL

## 2019-07-22 MED ORDER — CALCIUM CITRATE 200 MG PO TABS
2.0000 | ORAL_TABLET | Freq: Every day | ORAL | Status: DC
  Administered 2019-07-23: 400 mg via ORAL
  Filled 2019-07-22 (×4): qty 2

## 2019-07-22 MED ORDER — METOCLOPRAMIDE HCL 5 MG/ML IJ SOLN: 5.0000 mg | Freq: Three times a day (TID) | INTRAMUSCULAR | Status: DC | PRN

## 2019-07-22 MED ORDER — PHENYLEPHRINE 40 MCG/ML (10ML) SYRINGE FOR IV PUSH (FOR BLOOD PRESSURE SUPPORT)
PREFILLED_SYRINGE | INTRAVENOUS | Status: AC
  Filled 2019-07-22: qty 30

## 2019-07-22 MED ORDER — MIDAZOLAM HCL 5 MG/5ML IJ SOLN
INTRAMUSCULAR | Status: DC | PRN
  Administered 2019-07-22: 2 mg via INTRAVENOUS

## 2019-07-22 MED ORDER — LORATADINE 10 MG PO TABS
10.0000 mg | ORAL_TABLET | Freq: Every day | ORAL | Status: DC
  Administered 2019-07-23: 10 mg via ORAL
  Filled 2019-07-22: qty 1

## 2019-07-22 MED ORDER — ADULT MULTIVITAMIN W/MINERALS CH
1.0000 | ORAL_TABLET | Freq: Every day | ORAL | Status: DC
  Administered 2019-07-23: 1 via ORAL
  Filled 2019-07-22: qty 1

## 2019-07-22 MED ORDER — BUPIVACAINE IN DEXTROSE 0.75-8.25 % IT SOLN
INTRATHECAL | Status: DC | PRN
  Administered 2019-07-22: 1.6 mL via INTRATHECAL

## 2019-07-22 MED ORDER — PREGABALIN 50 MG PO CAPS
50.0000 mg | ORAL_CAPSULE | Freq: Once | ORAL | Status: AC
  Administered 2019-07-22: 50 mg via ORAL
  Filled 2019-07-22: qty 1

## 2019-07-22 MED ORDER — LOSARTAN POTASSIUM 50 MG PO TABS
100.0000 mg | ORAL_TABLET | Freq: Every day | ORAL | Status: DC
  Administered 2019-07-22 – 2019-07-23 (×2): 100 mg via ORAL
  Filled 2019-07-22 (×2): qty 2

## 2019-07-22 MED ORDER — METHOCARBAMOL 1000 MG/10ML IJ SOLN
500.0000 mg | Freq: Once | INTRAVENOUS | Status: AC
  Administered 2019-07-22: 500 mg via INTRAVENOUS
  Filled 2019-07-22: qty 5

## 2019-07-22 MED ORDER — FENTANYL CITRATE (PF) 250 MCG/5ML IJ SOLN
INTRAMUSCULAR | Status: AC
  Filled 2019-07-22: qty 5

## 2019-07-22 MED ORDER — ALUM & MAG HYDROXIDE-SIMETH 200-200-20 MG/5ML PO SUSP
30.0000 mL | ORAL | Status: DC | PRN
  Filled 2019-07-22: qty 30

## 2019-07-22 MED ORDER — SUCCINYLCHOLINE CHLORIDE 200 MG/10ML IV SOSY
PREFILLED_SYRINGE | INTRAVENOUS | Status: AC
  Filled 2019-07-22: qty 10

## 2019-07-22 MED ORDER — MENTHOL 3 MG MT LOZG
1.0000 | LOZENGE | OROMUCOSAL | Status: DC | PRN
  Filled 2019-07-22: qty 9

## 2019-07-22 MED ORDER — CHLORHEXIDINE GLUCONATE CLOTH 2 % EX PADS: 6.0000 | MEDICATED_PAD | Freq: Every day | CUTANEOUS | Status: DC

## 2019-07-22 MED ORDER — ONDANSETRON HCL 4 MG/2ML IJ SOLN: 4.0000 mg | Freq: Once | INTRAMUSCULAR | Status: DC | PRN

## 2019-07-22 MED ORDER — PHENYLEPHRINE 40 MCG/ML (10ML) SYRINGE FOR IV PUSH (FOR BLOOD PRESSURE SUPPORT)
PREFILLED_SYRINGE | INTRAVENOUS | Status: DC | PRN
  Administered 2019-07-22: 40 ug via INTRAVENOUS
  Administered 2019-07-22 (×4): 80 ug via INTRAVENOUS
  Administered 2019-07-22: 40 ug via INTRAVENOUS

## 2019-07-22 MED ORDER — SODIUM CHLORIDE 0.9 % IV SOLN: INTRAVENOUS | Status: DC

## 2019-07-22 MED ORDER — TRANEXAMIC ACID-NACL 1000-0.7 MG/100ML-% IV SOLN
1000.0000 mg | Freq: Once | INTRAVENOUS | Status: AC
  Administered 2019-07-22: 1000 mg via INTRAVENOUS
  Filled 2019-07-22: qty 100

## 2019-07-22 MED ORDER — ORAL CARE MOUTH RINSE: 15.0000 mL | Freq: Once | OROMUCOSAL | Status: AC

## 2019-07-22 MED ORDER — BUDESONIDE 0.25 MG/2ML IN SUSP
0.2500 mg | Freq: Two times a day (BID) | RESPIRATORY_TRACT | Status: DC
  Administered 2019-07-22 – 2019-07-23 (×2): 0.25 mg via RESPIRATORY_TRACT
  Filled 2019-07-22 (×2): qty 2

## 2019-07-22 MED ORDER — HYDROCODONE-ACETAMINOPHEN 7.5-325 MG PO TABS
1.0000 | ORAL_TABLET | ORAL | Status: DC | PRN
  Administered 2019-07-22 (×2): 2 via ORAL
  Administered 2019-07-23 (×2): 1 via ORAL
  Filled 2019-07-22 (×2): qty 2
  Filled 2019-07-22: qty 1
  Filled 2019-07-22: qty 2

## 2019-07-22 MED ORDER — ONDANSETRON HCL 4 MG/2ML IJ SOLN
4.0000 mg | Freq: Once | INTRAMUSCULAR | Status: AC
  Administered 2019-07-22: 4 mg via INTRAVENOUS
  Filled 2019-07-22: qty 2

## 2019-07-22 MED ORDER — ONDANSETRON HCL 4 MG/2ML IJ SOLN
4.0000 mg | Freq: Four times a day (QID) | INTRAMUSCULAR | Status: DC | PRN
  Administered 2019-07-22: 4 mg via INTRAVENOUS
  Filled 2019-07-22 (×2): qty 2

## 2019-07-22 MED ORDER — CEFAZOLIN SODIUM-DEXTROSE 2-4 GM/100ML-% IV SOLN
INTRAVENOUS | Status: AC
  Filled 2019-07-22: qty 100

## 2019-07-22 MED ORDER — MELATONIN 3 MG PO TABS
3.0000 mg | ORAL_TABLET | Freq: Every day | ORAL | Status: DC
  Administered 2019-07-22: 3 mg via ORAL
  Filled 2019-07-22: qty 1

## 2019-07-22 MED ORDER — AMLODIPINE BESYLATE 5 MG PO TABS
2.5000 mg | ORAL_TABLET | Freq: Every day | ORAL | Status: DC
  Administered 2019-07-23: 2.5 mg via ORAL
  Filled 2019-07-22 (×2): qty 1

## 2019-07-22 MED ORDER — TRAMADOL HCL 50 MG PO TABS
50.0000 mg | ORAL_TABLET | Freq: Four times a day (QID) | ORAL | Status: DC
  Administered 2019-07-22 – 2019-07-23 (×4): 50 mg via ORAL
  Filled 2019-07-22 (×4): qty 1

## 2019-07-22 MED ORDER — LAMOTRIGINE 100 MG PO TABS
100.0000 mg | ORAL_TABLET | Freq: Two times a day (BID) | ORAL | Status: DC
  Administered 2019-07-22 – 2019-07-23 (×2): 100 mg via ORAL
  Filled 2019-07-22 (×2): qty 1

## 2019-07-22 MED ORDER — MORPHINE SULFATE (PF) 2 MG/ML IV SOLN
0.5000 mg | INTRAVENOUS | Status: DC | PRN
  Administered 2019-07-22 – 2019-07-23 (×2): 1 mg via INTRAVENOUS
  Filled 2019-07-22 (×3): qty 1

## 2019-07-22 MED ORDER — ROPIVACAINE HCL 5 MG/ML IJ SOLN
INTRAMUSCULAR | Status: AC
  Filled 2019-07-22: qty 30

## 2019-07-22 MED ORDER — LACTATED RINGERS IV SOLN: INTRAVENOUS | Status: DC

## 2019-07-22 MED ORDER — TRANEXAMIC ACID-NACL 1000-0.7 MG/100ML-% IV SOLN
1000.0000 mg | INTRAVENOUS | Status: AC
  Administered 2019-07-22: 1000 mg via INTRAVENOUS

## 2019-07-22 MED ORDER — CEFAZOLIN SODIUM-DEXTROSE 2-4 GM/100ML-% IV SOLN
2.0000 g | Freq: Four times a day (QID) | INTRAVENOUS | Status: AC
  Administered 2019-07-22 (×2): 2 g via INTRAVENOUS
  Filled 2019-07-22 (×2): qty 100

## 2019-07-22 MED ORDER — ONDANSETRON HCL 4 MG PO TABS
4.0000 mg | ORAL_TABLET | Freq: Four times a day (QID) | ORAL | Status: DC | PRN
  Administered 2019-07-23: 4 mg via ORAL
  Filled 2019-07-22 (×2): qty 1

## 2019-07-22 MED ORDER — ONDANSETRON HCL 4 MG/2ML IJ SOLN
INTRAMUSCULAR | Status: AC
  Filled 2019-07-22: qty 2

## 2019-07-22 MED ORDER — TRANEXAMIC ACID-NACL 1000-0.7 MG/100ML-% IV SOLN
INTRAVENOUS | Status: AC
  Filled 2019-07-22: qty 100

## 2019-07-22 MED ORDER — ESTRADIOL 1 MG PO TABS
2.0000 mg | ORAL_TABLET | Freq: Every evening | ORAL | Status: DC
  Administered 2019-07-22: 2 mg via ORAL
  Filled 2019-07-22: qty 2

## 2019-07-22 MED ORDER — ONDANSETRON HCL 4 MG/2ML IJ SOLN
INTRAMUSCULAR | Status: AC
  Filled 2019-07-22: qty 4

## 2019-07-22 MED ORDER — METHOCARBAMOL 1000 MG/10ML IJ SOLN
500.0000 mg | Freq: Four times a day (QID) | INTRAVENOUS | Status: DC | PRN
  Filled 2019-07-22: qty 5

## 2019-07-22 MED ORDER — 0.9 % SODIUM CHLORIDE (POUR BTL) OPTIME
TOPICAL | Status: DC | PRN
  Administered 2019-07-22: 1000 mL

## 2019-07-22 MED ORDER — POLYETHYLENE GLYCOL 3350 17 G PO PACK
17.0000 g | PACK | Freq: Every day | ORAL | Status: DC
  Administered 2019-07-23: 17 g via ORAL
  Filled 2019-07-22 (×2): qty 1

## 2019-07-22 MED ORDER — DOCUSATE SODIUM 100 MG PO CAPS
100.0000 mg | ORAL_CAPSULE | Freq: Two times a day (BID) | ORAL | Status: DC
  Administered 2019-07-22 – 2019-07-23 (×2): 100 mg via ORAL
  Filled 2019-07-22 (×2): qty 1

## 2019-07-22 MED ORDER — METOCLOPRAMIDE HCL 5 MG PO TABS
5.0000 mg | ORAL_TABLET | Freq: Three times a day (TID) | ORAL | Status: DC | PRN
  Filled 2019-07-22: qty 2

## 2019-07-22 MED ORDER — VITAMIN D (ERGOCALCIFEROL) 1.25 MG (50000 UNIT) PO CAPS: 50000.0000 [IU] | ORAL_CAPSULE | ORAL | Status: DC

## 2019-07-22 MED ORDER — METFORMIN HCL 500 MG PO TABS
500.0000 mg | ORAL_TABLET | Freq: Two times a day (BID) | ORAL | Status: DC
  Administered 2019-07-22 – 2019-07-23 (×2): 500 mg via ORAL
  Filled 2019-07-22 (×2): qty 1

## 2019-07-22 MED ORDER — VENLAFAXINE HCL 37.5 MG PO TABS
75.0000 mg | ORAL_TABLET | Freq: Two times a day (BID) | ORAL | Status: DC
  Administered 2019-07-22 – 2019-07-23 (×2): 75 mg via ORAL
  Filled 2019-07-22 (×2): qty 2

## 2019-07-22 MED ORDER — HYDROMORPHONE HCL 1 MG/ML IJ SOLN: 0.2500 mg | INTRAMUSCULAR | Status: DC | PRN

## 2019-07-22 MED ORDER — LIDOCAINE 2% (20 MG/ML) 5 ML SYRINGE
INTRAMUSCULAR | Status: AC
  Filled 2019-07-22: qty 5

## 2019-07-22 MED ORDER — NAPROXEN 250 MG PO TABS
250.0000 mg | ORAL_TABLET | Freq: Two times a day (BID) | ORAL | Status: DC
  Administered 2019-07-22 – 2019-07-23 (×2): 250 mg via ORAL
  Filled 2019-07-22 (×2): qty 1

## 2019-07-22 MED ORDER — PHENYLEPHRINE HCL-NACL 10-0.9 MG/250ML-% IV SOLN
INTRAVENOUS | Status: DC | PRN
  Administered 2019-07-22: 30 ug/min via INTRAVENOUS

## 2019-07-22 MED ORDER — ALBUTEROL SULFATE (2.5 MG/3ML) 0.083% IN NEBU
3.0000 mL | INHALATION_SOLUTION | Freq: Four times a day (QID) | RESPIRATORY_TRACT | Status: DC | PRN
  Administered 2019-07-22 – 2019-07-23 (×2): 3 mL via RESPIRATORY_TRACT
  Filled 2019-07-22 (×2): qty 3

## 2019-07-22 MED ORDER — MIDAZOLAM HCL 2 MG/2ML IJ SOLN
INTRAMUSCULAR | Status: AC
  Filled 2019-07-22: qty 2

## 2019-07-22 MED ORDER — PROPOFOL 500 MG/50ML IV EMUL
INTRAVENOUS | Status: DC | PRN
  Administered 2019-07-22: 75 ug/kg/min via INTRAVENOUS

## 2019-07-22 MED ORDER — FENTANYL CITRATE (PF) 100 MCG/2ML IJ SOLN
INTRAMUSCULAR | Status: DC | PRN
  Administered 2019-07-22: 25 ug via INTRAVENOUS
  Administered 2019-07-22: 50 ug via INTRAVENOUS
  Administered 2019-07-22: 25 ug via INTRAVENOUS

## 2019-07-22 MED ORDER — ASPIRIN 81 MG PO CHEW
81.0000 mg | CHEWABLE_TABLET | Freq: Two times a day (BID) | ORAL | Status: DC
  Administered 2019-07-22 – 2019-07-23 (×2): 81 mg via ORAL
  Filled 2019-07-22 (×2): qty 1

## 2019-07-22 MED ORDER — PHENOL 1.4 % MT LIQD
1.0000 | OROMUCOSAL | Status: DC | PRN
  Filled 2019-07-22: qty 177

## 2019-07-22 MED ORDER — CEFAZOLIN SODIUM-DEXTROSE 2-4 GM/100ML-% IV SOLN
2.0000 g | INTRAVENOUS | Status: AC
  Administered 2019-07-22: 2 g via INTRAVENOUS

## 2019-07-22 MED ORDER — ROSUVASTATIN CALCIUM 10 MG PO TABS
5.0000 mg | ORAL_TABLET | Freq: Every day | ORAL | Status: DC
  Administered 2019-07-22 – 2019-07-23 (×2): 5 mg via ORAL
  Filled 2019-07-22 (×2): qty 1

## 2019-07-22 MED ORDER — ROCURONIUM BROMIDE 10 MG/ML (PF) SYRINGE
PREFILLED_SYRINGE | INTRAVENOUS | Status: AC
  Filled 2019-07-22: qty 10

## 2019-07-22 MED ORDER — SODIUM CHLORIDE 0.9 % IR SOLN
Status: DC | PRN
  Administered 2019-07-22: 3000 mL

## 2019-07-22 MED ORDER — ONDANSETRON HCL 4 MG/2ML IJ SOLN
INTRAMUSCULAR | Status: DC | PRN
  Administered 2019-07-22: 4 mg via INTRAVENOUS

## 2019-07-22 SURGICAL SUPPLY — 63 items
ATTUNE PS FEM RT SZ 4 CEM KNEE (Femur) ×1 IMPLANT
BANDAGE ESMARK 6X9 LF (GAUZE/BANDAGES/DRESSINGS) ×1 IMPLANT
BASEPLATE TIB CMT FB PCKT SZ4 (Stem) ×1 IMPLANT
BLADE HEX COATED 2.75 (ELECTRODE) ×2 IMPLANT
BLADE SAGITTAL 25.0X1.27X90 (BLADE) ×2 IMPLANT
BLADE SAW SGTL 11.0X1.19X90.0M (BLADE) ×2 IMPLANT
BNDG CMPR 9X6 STRL LF SNTH (GAUZE/BANDAGES/DRESSINGS) ×1
BNDG ESMARK 6X9 LF (GAUZE/BANDAGES/DRESSINGS) ×2
BSPLAT TIB 4 CMNT FX BRNG STRL (Stem) ×1 IMPLANT
CEMENT HV SMART SET (Cement) ×4 IMPLANT
CLOTH BEACON ORANGE TIMEOUT ST (SAFETY) ×2 IMPLANT
COOLER CRYO CUFF IC AND MOTOR (MISCELLANEOUS) ×2 IMPLANT
COOLER ICEMAN CLASSIC (MISCELLANEOUS) ×1 IMPLANT
COVER LIGHT HANDLE STERIS (MISCELLANEOUS) ×4 IMPLANT
COVER WAND RF STERILE (DRAPES) ×2 IMPLANT
CUFF TOURN SGL QUICK 34 (TOURNIQUET CUFF) ×2
CUFF TRNQT CYL 34X4.125X (TOURNIQUET CUFF) ×1 IMPLANT
DRAPE BACK TABLE (DRAPES) ×2 IMPLANT
DRAPE EXTREMITY T 121X128X90 (DISPOSABLE) ×2 IMPLANT
DRSG MEPILEX BORDER 4X12 (GAUZE/BANDAGES/DRESSINGS) ×2 IMPLANT
DURAPREP 26ML APPLICATOR (WOUND CARE) ×4 IMPLANT
ELECT REM PT RETURN 9FT ADLT (ELECTROSURGICAL) ×2
ELECTRODE REM PT RTRN 9FT ADLT (ELECTROSURGICAL) ×1 IMPLANT
GLOVE BIOGEL PI IND STRL 7.0 (GLOVE) ×2 IMPLANT
GLOVE BIOGEL PI INDICATOR 7.0 (GLOVE) ×4
GLOVE ECLIPSE 7.0 STRL STRAW (GLOVE) ×3 IMPLANT
GLOVE SKINSENSE NS SZ8.0 LF (GLOVE) ×2
GLOVE SKINSENSE STRL SZ8.0 LF (GLOVE) ×2 IMPLANT
GLOVE SS N UNI LF 8.5 STRL (GLOVE) ×2 IMPLANT
GOWN STRL REUS W/TWL LRG LVL3 (GOWN DISPOSABLE) ×6 IMPLANT
GOWN STRL REUS W/TWL XL LVL3 (GOWN DISPOSABLE) ×2 IMPLANT
HANDPIECE INTERPULSE COAX TIP (DISPOSABLE) ×2
HOOD W/PEELAWAY (MISCELLANEOUS) ×8 IMPLANT
INSERT TIB ATTUNE FB SZ1X6 (Insert) ×1 IMPLANT
INST SET MAJOR BONE (KITS) ×2 IMPLANT
IV NS IRRIG 3000ML ARTHROMATIC (IV SOLUTION) ×2 IMPLANT
KIT BLADEGUARD II DBL (SET/KITS/TRAYS/PACK) ×2 IMPLANT
KIT TURNOVER KIT A (KITS) ×2 IMPLANT
MANIFOLD NEPTUNE II (INSTRUMENTS) ×2 IMPLANT
MARKER SKIN DUAL TIP RULER LAB (MISCELLANEOUS) ×2 IMPLANT
NS IRRIG 1000ML POUR BTL (IV SOLUTION) ×2 IMPLANT
PACK TOTAL JOINT (CUSTOM PROCEDURE TRAY) ×1 IMPLANT
PAD ARMBOARD 7.5X6 YLW CONV (MISCELLANEOUS) ×2 IMPLANT
PAD COLD SHLDR WRAP-ON (PAD) ×1 IMPLANT
PATELLA MEDIAL ATTUN 35MM KNEE (Knees) ×1 IMPLANT
PENCIL SMOKE EVACUATOR (MISCELLANEOUS) ×2 IMPLANT
PILLOW KNEE EXTENSION 0 DEG (MISCELLANEOUS) ×2 IMPLANT
PIN THREADED HEADED SIGMA (PIN) ×2 IMPLANT
PIN/DRILL PACK ORTHO 1/8X3.0 (PIN) ×2 IMPLANT
SAW OSC TIP CART 19.5X105X1.3 (SAW) ×2 IMPLANT
SET BASIN LINEN APH (SET/KITS/TRAYS/PACK) ×2 IMPLANT
SET HNDPC FAN SPRY TIP SCT (DISPOSABLE) ×1 IMPLANT
STAPLER VISISTAT 35W (STAPLE) ×2 IMPLANT
SUT BRALON NAB BRD #1 30IN (SUTURE) ×3 IMPLANT
SUT MNCRL 0 VIOLET CTX 36 (SUTURE) ×1 IMPLANT
SUT MON AB 0 CT1 (SUTURE) ×2 IMPLANT
SUT MONOCRYL 0 CTX 36 (SUTURE) ×2
SYR BULB IRRIG 60ML STRL (SYRINGE) ×2 IMPLANT
TOWEL OR 17X26 4PK STRL BLUE (TOWEL DISPOSABLE) ×2 IMPLANT
TOWER CARTRIDGE SMART MIX (DISPOSABLE) ×2 IMPLANT
TRAY FOLEY MTR SLVR 16FR STAT (SET/KITS/TRAYS/PACK) ×2 IMPLANT
WATER STERILE IRR 1000ML POUR (IV SOLUTION) ×4 IMPLANT
YANKAUER SUCT 12FT TUBE ARGYLE (SUCTIONS) ×2 IMPLANT

## 2019-07-22 NOTE — Brief Op Note (Signed)
07/22/2019  9:49 AM  PATIENT:  Carrie Cook  59 y.o. female  PRE-OPERATIVE DIAGNOSIS:  osteoarthritis right knee  POST-OPERATIVE DIAGNOSIS:  osteoarthritis right knee  PROCEDURE:  Procedure(s): TOTAL KNEE ARTHROPLASTY (Right)   ATTUNE // TKA // 33F 4T 6 POLY 35 P   Femoral cut 11 Tibial cut 3 from the medial side Patella cut from 23-12  Findings Grade 4 chondral degeneration medial femoral condyle and tibial plateau ACL PCL intact medial lateral meniscus intact small medial osteophyte on the tibia mild grade 2 chondral degeneration patella  Assisted by Fulton Mole  SURGEON:  Surgeon(s) and Role:    * Carole Civil, MD - Primary  PHYSICIAN ASSISTANT:   Anesthesia spinal anesthetic and then in the PACU patient had a saphenous nerve block   EBL:  25 mL   BLOOD ADMINISTERED:none  DRAINS: none   LOCAL MEDICATIONS USED:  NONE  SPECIMEN:  No Specimen  DISPOSITION OF SPECIMEN:  N/A  COUNTS:  YES  TOURNIQUET:   Total Tourniquet Time Documented: Thigh (Right) - 77 minutes Total: Thigh (Right) - 77 minutes   DICTATION: .Viviann Spare Dictation  PLAN OF CARE: Admit to inpatient   PATIENT DISPOSITION:  PACU - hemodynamically stable.   Delay start of Pharmacological VTE agent (>24hrs) due to surgical blood loss or risk of bleeding: yes   Dictation of surgical procedure  The patient was identified by 2 approved identification mechanisms. The operative extremity was evaluated and found to be acceptable for surgical treatment today. The chart was reviewed. The surgical site was confirmed and marked.  The patient was taken to the operating room and given appropriate antibiotic Ancef 2 g. This is consistent with the SCIP protocol.  The patient was given the following anesthetic: Spinal, saphenous nerve block in PACU  The patient was then placed supine on the operating table. A Foley catheter was inserted. The operative extremity was prepped and draped sterilely  from the toes to the groin.  Timeout procedure was executed confirming the patient's name, surgical site, antibiotic administration, x-rays available, and implants available.  The operative limb,  was exsanguinated with a six-inch Esmarch and the tourniquet was inflated to 300 mmHg.  A straight midline incision was made over the right KNEE and taken down to the extensor mechanism. A medial arthrotomy was performed. The patella was everted and the patellofemoral band was released, along with the patellar fat pad.  The anterior cruciate ligament and PCL were resected.  The anterior horns of the lateral and medial meniscus were resected. The medial soft tissue sleeve was elevated to the mid coronal plane.  A three-eighths inch drill bit was used to enter the femoral canal which was decompressed with suction and irrigation until clear.   The distal femoral cutting guide was set for 11 mm distal resection,  5valgus alignment, for a left knee. The distal femur was resected and checked for flatness.  The Depuy Sigma sizing femoral guide was placed and the femur was sized to a size 4.   A 4-in-1 cutting block was placed along with collateral ligament retractors and the distal femoral cuts were completed.   The external alignment guide for the tibial resection was then applied to the distal and proximal tibia and set for anatomic slope along with 3 MM resection  from the medial.   Rotational alignment was set using the malleolus, the tibial tubercle and the tibial spines.  The proximal tibia was resected along with  residual menisci. The tibia was  sized using a base plate to a size 4.    Spacer blocks were used to confirm equal flexion extension gaps with releases done as needed. A size 6 MM spacer block gave equal stability and flexion extension.  The correct sized notch cutting guide for the femur was then applied and the notch cut was made.  Trial reduction was completed using 4 femur 4  tibia 6 polyethylene insert trial implants. Patella tracking was normal  We then skeletonized the patella. It measured 23 in thickness and the patellar resection was set for 12 millimeters. the patellar resection was completed. The patella diameter measured 35. We then drilled the peg holes for the patella  The proximal tibia was prepared using the size 4 base plate.  Thorough irrigation was performed and the bone was dried and prepared for cement. The cement was mixed on the back table using third generation preparation techniques  The implants were then cemented in place and excess cement was removed. The cement was allowed to cure. Irrigation was repeated and excess and residual bone fragments and cement were removed.  The extensor mechanism was closed with #1 Bralon suture followed by subcutaneous tissue closure using 0 Monocryl suture  Skin approximation was performed using staples  A sterile dressing was applied, followed by a ace wrapped from foot to thigh and then a Cryo/Cuff was placed The patient was taken recovery room in stable condition

## 2019-07-22 NOTE — Transfer of Care (Signed)
Immediate Anesthesia Transfer of Care Note  Patient: Carrie Cook  Procedure(s) Performed: TOTAL KNEE ARTHROPLASTY (Right Knee)  Patient Location: PACU  Anesthesia Type:MAC and Spinal  Level of Consciousness: awake, alert  and oriented  Airway & Oxygen Therapy: Patient Spontanous Breathing  Post-op Assessment: Report given to RN and Post -op Vital signs reviewed and stable  Post vital signs: Reviewed and stable  Last Vitals:  Vitals Value Taken Time  BP 119/82 07/22/19 0952  Temp    Pulse 79 07/22/19 0958  Resp 11 07/22/19 0958  SpO2 96 % 07/22/19 0958  Vitals shown include unvalidated device data.  Last Pain:  Vitals:   07/22/19 0645  TempSrc: Oral      Patients Stated Pain Goal: 6 (01/31/30 3557)  Complications: No complications documented.

## 2019-07-22 NOTE — Plan of Care (Signed)
  Problem: Acute Rehab PT Goals(only PT should resolve) Goal: Pt Will Go Supine/Side To Sit Outcome: Progressing Flowsheets (Taken 07/22/2019 1545) Pt will go Supine/Side to Sit: with modified independence Goal: Patient Will Transfer Sit To/From Stand Outcome: Progressing Flowsheets (Taken 07/22/2019 1545) Patient will transfer sit to/from stand: with modified independence Goal: Pt Will Transfer Bed To Chair/Chair To Bed Outcome: Progressing Flowsheets (Taken 07/22/2019 1545) Pt will Transfer Bed to Chair/Chair to Bed: with modified independence Goal: Pt Will Ambulate Outcome: Progressing Flowsheets (Taken 07/22/2019 1545) Pt will Ambulate:  100 feet  with modified independence  with rolling walker   3:46 PM, 07/22/19 Lonell Grandchild, MPT Physical Therapist with [facility] [phone] office [phone] mobile phone

## 2019-07-22 NOTE — Anesthesia Procedure Notes (Signed)
Spinal  Patient location during procedure: OR Start time: 07/22/2019 8:39 AM End time: 07/22/2019 8:52 AM Staffing Performed: anesthesiologist  Anesthesiologist: Louann Sjogren, MD Resident/CRNA: Gwyndolyn Saxon, CRNA Preanesthetic Checklist Completed: patient identified, IV checked, site marked, risks and benefits discussed, surgical consent, monitors and equipment checked, pre-op evaluation and timeout performed Spinal Block Patient position: sitting Prep: Betadine Patient monitoring: heart rate, continuous pulse ox and blood pressure Approach: midline Location: L3-4 Injection technique: single-shot Needle Needle type: Pencan  Needle gauge: 24 G Needle length: 10 cm Assessment Sensory level: T8

## 2019-07-22 NOTE — Evaluation (Signed)
Physical Therapy Evaluation Patient Details Name: Carrie Cook MRN: [id number] DOB: January 06, 1961 Today's Date: 07/22/2019  RIGHT KNEE ROM:  0 - 90 degrees AMBULATION DISTANCE: 40 feet using RW with Supervision    History of Present Illness  Carrie Cook is a 59 y/o female s/p Right TKA on 07/22/19 with the diagnosis of osteoarthritis right knee.  Clinical Impression  Patient presents labile and required pain medication prior to therapy due to c/o severe right knee pain.  Patient instructed in HEP with good return demonstrated and understanding acknowledged, demonstrates slow labored cadence with limited weightbearing on RLE due to pain, made it into hallway before requesting to return to bedside and tolerated sitting up in chair with RLE dangling after therapy - NT notified.  Patient will benefit from continued physical therapy in hospital and recommended venue below to increase strength, balance, endurance for safe ADLs and gait.    Follow Up Recommendations Home health PT;Supervision for mobility/OOB;Supervision - Intermittent    Equipment Recommendations  None recommended by PT    Recommendations for Other Services       Precautions / Restrictions Precautions Precautions: None Restrictions Weight Bearing Restrictions: Yes RLE Weight Bearing: Weight bearing as tolerated      Mobility  Bed Mobility Overal bed mobility: Needs Assistance Bed Mobility: Supine to Sit     Supine to sit: Modified independent (Device/Increase time);Supervision     General bed mobility comments: increased time, labored movement  Transfers Overall transfer level: Needs assistance Equipment used: Rolling walker (2 wheeled) Transfers: Sit to/from [employer] Transfers Sit to Stand: Supervision Stand pivot transfers: Supervision;Min guard       General transfer comment: increased time, labored movement  Ambulation/Gait Ambulation/Gait assistance: Supervision Gait Distance  (Feet): 40 Feet Assistive device: Rolling walker (2 wheeled) Gait Pattern/deviations: Decreased step length - right;Decreased stance time - right;Decreased stride length;Antalgic Gait velocity: decreased   General Gait Details: slow labored cadence with limited weightbearing on RLE due to increased pain, fair return for right heel to toe stepping after verbal cues  Stairs            Wheelchair Mobility    Modified Rankin (Stroke Patients Only)       Balance Overall balance assessment: Needs assistance Sitting-balance support: Feet supported;No upper extremity supported Sitting balance-Leahy Scale: Good Sitting balance - Comments: seated at EOB   Standing balance support: During functional activity;Bilateral upper extremity supported Standing balance-Leahy Scale: Fair Standing balance comment: using RW                             Pertinent Vitals/Pain Pain Assessment: Faces Faces Pain Scale: Hurts even more Pain Location: right knee Pain Descriptors / Indicators: Sore;Grimacing;Crying;Guarding Pain Intervention(s): Limited activity within patient's tolerance;Monitored during session;Premedicated before session;Repositioned    Home Living Family/patient expects to be discharged to:: Private residence Living Arrangements: Spouse/significant other Available Help at Discharge: Family;Available PRN/intermittently Type of Home: House Home Access: Stairs to enter Entrance Stairs-Rails: None Entrance Stairs-Number of Steps: 3 Home Layout: One level Home Equipment: Walker - 2 wheels;Bedside commode      Prior Function Level of Independence: Independent         Comments: Hydrographic surveyor, drives     Hand Dominance   Dominant Hand: Right    Extremity/Trunk Assessment   Upper Extremity Assessment Upper Extremity Assessment: Overall WFL for tasks assessed    Lower Extremity Assessment Lower Extremity Assessment: RLE deficits/detail;Overall [facility]  for tasks assessed RLE Deficits / Details: grossly -4/5 RLE: Unable to fully assess due to pain RLE Sensation: WNL RLE Coordination: WNL    Cervical / Trunk Assessment Cervical / Trunk Assessment: Normal  Communication   Communication: No difficulties  Cognition Arousal/Alertness: Awake/alert Behavior During Therapy: WFL for tasks assessed/performed Overall Cognitive Status: Within Functional Limits for tasks assessed                                        General Comments      Exercises Total Joint Exercises Ankle Circles/Pumps: Supine;10 reps;Both;Strengthening;AROM Quad Sets: Supine;5 reps;Both;Strengthening;AROM Gluteal Sets: Supine;5 reps;Both;Strengthening;AROM Short Arc Quad: Supine;Right;Strengthening;AROM;10 reps Heel Slides: Supine;5 reps;Right;Strengthening;AROM Goniometric ROM: 0 - 90 degrees   Assessment/Plan    PT Assessment Patient needs continued PT services  PT Problem List Decreased strength;Decreased range of motion;Decreased activity tolerance;Decreased balance;Decreased mobility       PT Treatment Interventions DME instruction;Gait training;Stair training;Functional mobility training;Therapeutic activities;Therapeutic exercise;Patient/family education;Balance training    PT Goals (Current goals can be found in the Care Plan section)  Acute Rehab PT Goals Patient Stated Goal: return home with family to assist PT Goal Formulation: With patient Time For Goal Achievement: 07/24/19 Potential to Achieve Goals: Good    Frequency BID   Barriers to discharge        Co-evaluation               AM-PAC PT "6 Clicks" Mobility  Outcome Measure Help needed turning from your back to your side while in a flat bed without using bedrails?: None Help needed moving from lying on your back to sitting on the side of a flat bed without using bedrails?: A Little Help needed moving to and from a bed to a chair (including a wheelchair)?: A  Little Help needed standing up from a chair using your arms (e.g., wheelchair or bedside chair)?: None Help needed to walk in hospital room?: A Little Help needed climbing 3-5 steps with a railing? : A Lot 6 Click Score: 19    End of Session   Activity Tolerance: Patient tolerated treatment well;Patient limited by fatigue;Patient limited by pain Patient left: in chair;with call bell/phone within reach Nurse Communication: Mobility status PT Visit Diagnosis: Unsteadiness on feet (R26.81);Other abnormalities of gait and mobility (R26.89);Muscle weakness (generalized) (M62.81)    Time: 0998-3382 PT Time Calculation (min) (ACUTE ONLY): 34 min   Charges:   PT Evaluation $PT Eval Moderate Complexity: 1 Mod PT Treatments $Therapeutic Activity: 23-37 mins        3:44 PM, 07/22/19 Lonell Grandchild, MPT Physical Therapist with [facility] [phone] office [phone] mobile phone

## 2019-07-22 NOTE — Anesthesia Procedure Notes (Signed)
Anesthesia Regional Block: Adductor canal block   Pre-Anesthetic Checklist: ,, timeout performed, Correct Patient, Correct Site, Correct Laterality, Correct Procedure, Correct Position, site marked, Risks and benefits discussed,  Surgical consent,  Pre-op evaluation,  At surgeon's request and post-op pain management  Laterality: Right  Prep: Maximum Sterile Barrier Precautions used, chloraprep       Needles:  Injection technique: Single-shot  Needle Type: Stimulator Needle - 80     Needle Length: 9cm  Needle Gauge: 22     Additional Needles:   Procedures:,,,, ultrasound used (permanent image in chart),,,,  Narrative:  Start time: 07/22/2019 10:01 AM End time: 07/22/2019 10:05 AM Injection made incrementally with aspirations every 30 mL.  Performed by: Personally

## 2019-07-22 NOTE — TOC Initial Note (Signed)
Transition of Care [facility]) - Initial/Assessment Note   Patient Details  Name: Carrie Cook MRN: [id number] Date of Birth: [birth year]  Transition of Care [facility]) CM/SW Contact:    Sherie Don, LCSW Phone Number: 07/22/2019, 1:16 PM  Clinical Narrative: Patient is a 59 year old female under observation following surgery for primary osteoarthritis of right knee. TOC received consult for HH/DME needs. CSW spoke with patient to complete assessment. Per patient, she lives at home with her husband, who will be working during the day while she recovers at home. Patient reported a walker and BSC were delivered to her house by [employer]. CSW updated patient that [facility] will provide HHPT upon discharge. TOC to follow.  Expected Discharge Plan: [place] Barriers to Discharge: Continued Medical Work up  Patient Goals and CMS Choice Patient states their goals for this hospitalization and ongoing recovery are:: Return home CMS Medicare.gov Compare Post Acute Care list provided to:: Patient Choice offered to / list presented to : Patient  Expected Discharge Plan and Services Expected Discharge Plan: [place] In-house Referral: Clinical Social Work Discharge Planning Services: NA Post Acute Care Choice: [place] arrangements for the past 2 months: Single Family Home          DME Arranged: N/A DME Agency: NA HH Arranged: PT [place] Agency: [facility] [employer]  Prior Living Arrangements/Services Living arrangements for the past 2 months: [place] with:: Spouse Patient language and need for interpreter reviewed:: Yes Do you feel safe going back to the place where you live?: Yes Need for Family Participation in Patient Care: No (Comment) Care giver support system in place?: Yes (comment) Walker Kehr (husband)) Current home services: DME [facility] and BSC from [employer]) Criminal Activity/Legal Involvement  Pertinent to Current Situation/Hospitalization: No - Comment as needed  Permission Sought/Granted Permission sought to share information with : Chartered certified accountant granted to share info w AGENCY: [facility]  Emotional Assessment Appearance:: Appears stated age Attitude/Demeanor/Rapport: Engaged Affect (typically observed): Accepting, Appropriate Orientation: : Oriented to Self, Oriented to Place, Oriented to  Time, Oriented to Situation Alcohol / Substance Use: Not Applicable Psych Involvement: No (comment)  Admission diagnosis:  Primary localized osteoarthritis of left knee [M17.12] Patient Active Problem List   Diagnosis Date Noted  . Primary localized osteoarthritis of left knee 07/22/2019  . Primary osteoarthritis of right knee   . Constipation 09/05/2018  . Abdominal pain, chronic, epigastric 05/27/2018  . Dysphagia 05/27/2018  . Depression, major, single episode, moderate ([place]) 03/22/2018  . S/P complete hysterectomy 06/06/2017  . Derangement of posterior horn of medial meniscus of right knee   . Right knee pain 09/10/2015  . Iron deficiency anemia 10/16/2013  . Fibroids 08/21/2013  . Excessive or frequent menstruation 06/19/2013  . Morbid obesity ([place]) 07/12/2012  . Obstructive sleep apnea 06/21/2012  . Chest pain   . Hyperlipidemia   . Type 2 diabetes mellitus ([place])   . Obesity   . Laboratory test   . Nephrolithiasis   . Asthma   . Endometriosis   . Hypertension   . Iron deficiency anemia 05/11/2009  . GERD 05/11/2009   PCP:  Kathyrn Drown, MD Pharmacy:   [facility] [place] [address] AT [address] [place] [address] Phone: [phone] Fax: [phone]  Readmission Risk Interventions No flowsheet  data found.

## 2019-07-22 NOTE — Interval H&P Note (Signed)
History and Physical Interval Note:  07/22/2019 7:27 AM  Carrie Cook  has presented today for surgery, with the diagnosis of osteoarthritis right knee.  The various methods of treatment have been discussed with the patient and family. After consideration of risks, benefits and other options for treatment, the patient has consented to  Procedure(s): TOTAL KNEE ARTHROPLASTY (Right) as a surgical intervention.  The patient's history has been reviewed, patient examined, no change in status, stable for surgery.  I have reviewed the patient's chart and labs.  Questions were answered to the patient's satisfaction.     Arther Abbott

## 2019-07-22 NOTE — Op Note (Signed)
07/22/2019  9:49 AM  PATIENT:  Carrie Cook  59 y.o. female  PRE-OPERATIVE DIAGNOSIS:  osteoarthritis right knee  POST-OPERATIVE DIAGNOSIS:  osteoarthritis right knee  PROCEDURE:  Procedure(s): TOTAL KNEE ARTHROPLASTY (Right)   ATTUNE // TKA // 75F 4T 6 POLY 35 P   Femoral cut 11 Tibial cut 3 from the medial side Patella cut from 23-12  Findings Grade 4 chondral degeneration medial femoral condyle and tibial plateau ACL PCL intact medial lateral meniscus intact small medial osteophyte on the tibia mild grade 2 chondral degeneration patella  Assisted by [place] Mole  SURGEON:  Surgeon(s) and Role:    * Carole Civil, MD - Primary  PHYSICIAN ASSISTANT:   Anesthesia spinal anesthetic and then in the PACU patient had a saphenous nerve block   EBL:  25 mL   BLOOD ADMINISTERED:none  DRAINS: none   LOCAL MEDICATIONS USED:  NONE  SPECIMEN:  No Specimen  DISPOSITION OF SPECIMEN:  N/A  COUNTS:  YES  TOURNIQUET:   Total Tourniquet Time Documented: Thigh (Right) - 77 minutes Total: Thigh (Right) - 77 minutes   DICTATION: .Viviann Spare Dictation  PLAN OF CARE: Admit to inpatient   PATIENT DISPOSITION:  PACU - hemodynamically stable.   Delay start of Pharmacological VTE agent (>24hrs) due to surgical blood loss or risk of bleeding: yes   Dictation of surgical procedure  The patient was identified by 2 approved identification mechanisms. The operative extremity was evaluated and found to be acceptable for surgical treatment today. The chart was reviewed. The surgical site was confirmed and marked.  The patient was taken to the operating room and given appropriate antibiotic Ancef 2 g. This is consistent with the SCIP protocol.  The patient was given the following anesthetic: Spinal, saphenous nerve block in PACU  The patient was then placed supine on the operating table. A Foley catheter was inserted. The operative extremity was prepped and draped sterilely  from the toes to the groin.  Timeout procedure was executed confirming the patient's name, surgical site, antibiotic administration, x-rays available, and implants available.  The operative limb,  was exsanguinated with a six-inch Esmarch and the tourniquet was inflated to 300 mmHg.  A straight midline incision was made over the right KNEE and taken down to the extensor mechanism. A medial arthrotomy was performed. The patella was everted and the patellofemoral band was released, along with the patellar fat pad.  The anterior cruciate ligament and PCL were resected.  The anterior horns of the lateral and medial meniscus were resected. The medial soft tissue sleeve was elevated to the mid coronal plane.  A three-eighths inch drill bit was used to enter the femoral canal which was decompressed with suction and irrigation until clear.   The distal femoral cutting guide was set for 11 mm distal resection,  5valgus alignment, for a left knee. The distal femur was resected and checked for flatness.  The Depuy Sigma sizing femoral guide was placed and the femur was sized to a size 4.   A 4-in-1 cutting block was placed along with collateral ligament retractors and the distal femoral cuts were completed.   The external alignment guide for the tibial resection was then applied to the distal and proximal tibia and set for anatomic slope along with 3 MM resection  from the medial.   Rotational alignment was set using the malleolus, the tibial tubercle and the tibial spines.  The proximal tibia was resected along with  residual menisci. The tibia was  sized using a base plate to a size 4.    Spacer blocks were used to confirm equal flexion extension gaps with releases done as needed. A size 6 MM spacer block gave equal stability and flexion extension.  The correct sized notch cutting guide for the femur was then applied and the notch cut was made.  Trial reduction was completed using 4 femur 4  tibia 6 polyethylene insert trial implants. Patella tracking was normal  We then skeletonized the patella. It measured 23 in thickness and the patellar resection was set for 12 millimeters. the patellar resection was completed. The patella diameter measured 35. We then drilled the peg holes for the patella  The proximal tibia was prepared using the size 4 base plate.  Thorough irrigation was performed and the bone was dried and prepared for cement. The cement was mixed on the back table using third generation preparation techniques  The implants were then cemented in place and excess cement was removed. The cement was allowed to cure. Irrigation was repeated and excess and residual bone fragments and cement were removed.  The extensor mechanism was closed with #1 Bralon suture followed by subcutaneous tissue closure using 0 Monocryl suture  Skin approximation was performed using staples  A sterile dressing was applied, followed by a ace wrapped from foot to thigh and then a Cryo/Cuff was placed The patient was taken recovery room in stable condition

## 2019-07-22 NOTE — Anesthesia Preprocedure Evaluation (Signed)
Anesthesia Evaluation  Patient identified by MRN, date of birth, ID band Patient awake    Reviewed: Allergy & Precautions, H&P , NPO status , Patient's Chart, lab work & pertinent test results, reviewed documented beta blocker date and time   History of Anesthesia Complications (+) PONV and history of anesthetic complications  Airway Mallampati: II  TM Distance: >3 FB Neck ROM: full    Dental no notable dental hx. (+) Teeth Intact   Pulmonary shortness of breath, asthma , sleep apnea ,    Pulmonary exam normal breath sounds clear to auscultation       Cardiovascular Exercise Tolerance: Good hypertension, negative cardio ROS   Rhythm:regular Rate:Normal     Neuro/Psych  Headaches, PSYCHIATRIC DISORDERS Anxiety Depression    GI/Hepatic Neg liver ROS, hiatal hernia, GERD  Medicated,  Endo/Other  negative endocrine ROSdiabetes  Renal/GU Renal disease  negative genitourinary   Musculoskeletal   Abdominal   Peds  Hematology  (+) Blood dyscrasia, anemia ,   Anesthesia Other Findings   Reproductive/Obstetrics negative OB ROS                             Anesthesia Physical Anesthesia Plan  ASA: III  Anesthesia Plan: Spinal   Post-op Pain Management:    Induction:   PONV Risk Score and Plan: 3 and Propofol infusion  Airway Management Planned:   Additional Equipment:   Intra-op Plan:   Post-operative Plan:   Informed Consent: I have reviewed the patients History and Physical, chart, labs and discussed the procedure including the risks, benefits and alternatives for the proposed anesthesia with the patient or authorized representative who has indicated his/her understanding and acceptance.     Dental Advisory Given  Plan Discussed with: CRNA  Anesthesia Plan Comments:         Anesthesia Quick Evaluation

## 2019-07-22 NOTE — Care Management Obs Status (Signed)
[place] NOTIFICATION   Patient Details  Name: Carrie Cook MRN: [id number] Date of Birth: [birth year]   Medicare Observation Status Notification Given:  Yes    Tommy Medal 07/22/2019, 1:38 PM

## 2019-07-22 NOTE — Anesthesia Postprocedure Evaluation (Signed)
Anesthesia Post Note  Patient: Carrie Cook  Procedure(s) Performed: TOTAL KNEE ARTHROPLASTY (Right Knee)  Patient location during evaluation: PACU Anesthesia Type: Spinal Level of consciousness: awake Pain management: pain level controlled Vital Signs Assessment: post-procedure vital signs reviewed and stable Respiratory status: spontaneous breathing Cardiovascular status: blood pressure returned to baseline Postop Assessment: no headache and no backache Anesthetic complications: no   No complications documented.   Last Vitals:  Vitals:   07/22/19 1512 07/22/19 1604  BP: (!) 144/87 (!) 157/84  Pulse: 86 93  Resp: 20 18  Temp:  36.8 C  SpO2: 96% 94%    Last Pain:  Vitals:   07/22/19 1648  TempSrc:   PainSc: [place]

## 2019-07-23 ENCOUNTER — Encounter (HOSPITAL_COMMUNITY): Payer: Self-pay | Admitting: Orthopedic Surgery

## 2019-07-23 DIAGNOSIS — M1711 Unilateral primary osteoarthritis, right knee: Secondary | ICD-10-CM | POA: Diagnosis not present

## 2019-07-23 LAB — CBC
HCT: 40.7 % (ref 36.0–46.0)
Hemoglobin: 13.4 g/dL (ref 12.0–15.0)
MCH: 30.6 pg (ref 26.0–34.0)
MCHC: 32.9 g/dL (ref 30.0–36.0)
MCV: 92.9 fL (ref 80.0–100.0)
Platelets: 228 10*3/uL (ref 150–400)
RBC: 4.38 MIL/uL (ref 3.87–5.11)
RDW: 12.8 % (ref 11.5–15.5)
WBC: 10.8 10*3/uL — ABNORMAL HIGH (ref 4.0–10.5)
nRBC: 0 % (ref 0.0–0.2)

## 2019-07-23 LAB — BASIC METABOLIC PANEL
Anion gap: 9 (ref 5–15)
BUN: 8 mg/dL (ref 6–20)
CO2: 27 mmol/L (ref 22–32)
Calcium: 8.5 mg/dL — ABNORMAL LOW (ref 8.9–10.3)
Chloride: 100 mmol/L (ref 98–111)
Creatinine, Ser: 0.64 mg/dL (ref 0.44–1.00)
GFR calc Af Amer: >60 mL/min (ref >60)
GFR calc non Af Amer: >60 mL/min (ref >60)
Glucose, Bld: 197 mg/dL — ABNORMAL HIGH (ref 70–99)
Potassium: 4.3 mmol/L (ref 3.5–5.1)
Sodium: 136 mmol/L (ref 135–145)

## 2019-07-23 LAB — GLUCOSE, CAPILLARY: Glucose-Capillary: 174 mg/dL — ABNORMAL HIGH (ref 70–99)

## 2019-07-23 MED ORDER — GABAPENTIN 300 MG PO CAPS: 300.0000 mg | ORAL_CAPSULE | Freq: Three times a day (TID) | ORAL | 1 refills | Status: DC

## 2019-07-23 MED ORDER — DOCUSATE SODIUM 100 MG PO CAPS: 100.0000 mg | ORAL_CAPSULE | Freq: Two times a day (BID) | ORAL | 0 refills | Status: DC

## 2019-07-23 MED ORDER — HYDROCODONE-ACETAMINOPHEN 7.5-325 MG PO TABS: 1.0000 | ORAL_TABLET | ORAL | 0 refills | Status: AC | PRN

## 2019-07-23 MED ORDER — TRAMADOL HCL 50 MG PO TABS: 50.0000 mg | ORAL_TABLET | Freq: Four times a day (QID) | ORAL | 0 refills | Status: AC | PRN

## 2019-07-23 MED ORDER — METHOCARBAMOL 500 MG PO TABS: 500.0000 mg | ORAL_TABLET | Freq: Four times a day (QID) | ORAL | 1 refills | Status: DC | PRN

## 2019-07-23 MED ORDER — POLYETHYLENE GLYCOL 3350 17 G PO PACK: 17.0000 g | PACK | Freq: Every day | ORAL | 0 refills | Status: DC

## 2019-07-23 MED ORDER — ASPIRIN 81 MG PO CHEW: 81.0000 mg | CHEWABLE_TABLET | Freq: Two times a day (BID) | ORAL | 0 refills | Status: DC

## 2019-07-23 NOTE — TOC Transition Note (Signed)
Transition of Care [facility]) - CM/SW Discharge Note   Patient Details  Name: Carrie Cook MRN: [id number] Date of Birth: [birth year]  Transition of Care [facility]) CM/SW Contact:  Salome Arnt, LCSW Phone Number: 07/23/2019, 4:02 PM   Clinical Narrative:  Pt discharging home today with home health. LCSW confirmed with [facility] that home health orders were received prior to surgery.  Equipment also delivered prior to surgery. No further needs per pt.     Final next level of care: [place] Barriers to Discharge: Barriers Resolved   Patient Goals and CMS Choice Patient states their goals for this hospitalization and ongoing recovery are:: Return home CMS Medicare.gov Compare Post Acute Care list provided to:: Patient Choice offered to / list presented to : Patient  Discharge Placement                    Patient and family notified of of transfer: 07/23/19  Discharge Plan and Services In-house Referral: Clinical Social Work Discharge Planning Services: NA Post Acute Care Choice: Home Health          DME Arranged: N/A DME Agency: NA       HH Arranged: PT [place] [facility]: [facility] [employer] Date [place]: 07/23/19 Time [place]: 1600 Representative spoke with at [place]: [place] at [place] ([place]) Interventions     Readmission Risk Interventions No flowsheet data found.

## 2019-07-23 NOTE — Progress Notes (Signed)
Patient ID: Carrie Cook, female   DOB: 23-Sep-1960, 58 y.o.   MRN: [id number]  POD 1 RIGHT TKA   BP (!) 144/96 (BP Location: Left Arm)   Pulse 94   Temp 98.1 F (36.7 C) (Oral)   Resp 16   Ht 5\' 3"  (1.6 m)   Wt 92.1 kg   LMP 07/10/2013   SpO2 96%   BMI 35.97 kg/m   CBC Latest Ref Rng & Units 07/23/2019 07/18/2019 05/29/2019  WBC 4.0 - 10.5 K/uL 10.8(H) 8.9 6.2  Hemoglobin 12.0 - 15.0 g/dL 13.4 15.4(H) 15.9(H)  Hematocrit 36 - 46 % 40.7 45.1 48.0(H)  Platelets 150 - 400 K/uL 228 285 274   BMP Latest Ref Rng & Units 07/23/2019 07/18/2019 05/29/2019  Glucose 70 - 99 mg/dL 197(H) 247(H) 337(H)  BUN 6 - 20 mg/dL 8 14 8   Creatinine 0.44 - 1.00 mg/dL 0.64 0.66 0.79  BUN/Creat Ratio 9 - 23 - - -  Sodium 135 - 145 mmol/L 136 136 136  Potassium 3.5 - 5.1 mmol/L 4.3 4.3 4.0  Chloride 98 - 111 mmol/L 100 101 101  CO2 22 - 32 mmol/L 27 25 25   Calcium 8.9 - 10.3 mg/dL 8.5(L) 9.0 9.0   GLUCOSE IS HIGH, CONTINUE SLIDING SCALE GOAL 110   C/O NAUSEA RELIEVED BY ANTIEMETICS  SORE QUAD/ FROM TOURNIQUET, WILL RESOLVE   PT TODAY; IF TOLERATES STAIRS WE CAN D/C TO HOME WITH PT

## 2019-07-23 NOTE — Progress Notes (Signed)
Foley catheter removed without difficulty, no c/o pain or discomfort.

## 2019-07-23 NOTE — Progress Notes (Signed)
Nsg Discharge Note  Admit Date:  07/22/2019 Discharge date: 07/23/2019   Carrie Cook to be D/C'd per MD order.  AVS completed.  Copy for chart, and copy for patient signed, and dated. Patient/caregiver able to verbalize understanding.  Discharge Medication: Allergies as of 07/23/2019      Reactions   Qvar [beclomethasone] Shortness Of Breath   Patient states that this medication causes her to feel short of breath   Prozac [fluoxetine Hcl] Hives   Ace Inhibitors Cough   Erythromycin Ethylsuccinate Hives, Nausea Only   Fluoxetine Hcl Other (See Comments)   unknown   Hydrochlorothiazide Cough   Lipitor [atorvastatin Calcium] Other (See Comments)   Body aches, flu-like symptoms   Dyazide [hydrochlorothiazide W-triamterene] Cough   Erythromycin Nausea Only   Lisinopril Cough   Pravastatin Other (See Comments)   BODY ACHES, FLU-LIKE SX.   Sulfa Antibiotics Rash   Sulfonamide Derivatives Hives, Rash      Medication List    TAKE these medications   albuterol 108 (90 Base) MCG/ACT inhaler Commonly known as: VENTOLIN HFA Inhale 2 puffs into the lungs every 6 (six) hours as needed for wheezing.   amLODipine 2.5 MG tablet Commonly known as: NORVASC Take one tablet po daily What changed:   how much to take  how to take this  when to take this  additional instructions   aspirin 81 MG chewable tablet Chew 1 tablet (81 mg total) by mouth 2 (two) times daily.   Calcium Citrate 200 MG Tabs Take 400 mg by mouth daily.   cetirizine 10 MG tablet Commonly known as: ZYRTEC Take 10 mg by mouth daily.   docusate sodium 100 MG capsule Commonly known as: COLACE Take 1 capsule (100 mg total) by mouth 2 (two) times daily.   ergocalciferol 1.25 MG (50000 UT) capsule Commonly known as: VITAMIN D2 Take 1 capsule (50,000 Units total) by mouth once a week. What changed: when to take this   estradiol 2 MG tablet Commonly known as: ESTRACE Take 1 tablet (2 mg total) by mouth at  bedtime. What changed: when to take this   fluticasone 220 MCG/ACT inhaler Commonly known as: Flovent HFA Inhale 2 puffs into the lungs 2 (two) times daily. What changed:   when to take this  reasons to take this   gabapentin 300 MG capsule Commonly known as: NEURONTIN Take 1 capsule (300 mg total) by mouth 3 (three) times daily.   HYDROcodone-acetaminophen 7.5-325 MG tablet Commonly known as: NORCO Take 1 tablet by mouth every 4 (four) hours as needed for up to 7 days for severe pain (pain score 7-10).   LaMICtal 100 MG tablet Generic drug: lamoTRIgine Take 100 mg by mouth 2 (two) times daily.   Lantus SoloStar 100 UNIT/ML Solostar Pen Generic drug: insulin glargine Use 24 units qhs. May titrate to 30 units qhs What changed:   how much to take  how to take this  when to take this  additional instructions   losartan 100 MG tablet Commonly known as: COZAAR TAKE 1 TABLET BY MOUTH DAILY What changed:   how much to take  how to take this  when to take this  additional instructions   Melatonin 3 MG Subl Place 3 mg under the tongue at bedtime.   metFORMIN 500 MG tablet Commonly known as: GLUCOPHAGE TAKE 1 TABLET(500 MG) BY MOUTH TWICE DAILY WITH A MEAL What changed: See the new instructions.   methocarbamol 500 MG tablet Commonly known as: ROBAXIN  Take 1 tablet (500 mg total) by mouth every 6 (six) hours as needed for muscle spasms.   multivitamin with minerals Tabs tablet Take 1 tablet by mouth daily.   Pen Needles 32G X 4 MM Misc 32 g by Does not apply route daily.   polyethylene glycol 17 g packet Commonly known as: MIRALAX / GLYCOLAX Take 17 g by mouth daily. Start taking on: July 24, 2019   rosuvastatin 5 MG tablet Commonly known as: Crestor Take one tablet po every day What changed:   how much to take  how to take this  when to take this  additional instructions   traMADol 50 MG tablet Commonly known as: ULTRAM Take 1 tablet (50  mg total) by mouth every 6 (six) hours as needed for up to 7 days.   venlafaxine 75 MG tablet Commonly known as: EFFEXOR Take 75 mg by mouth 2 (two) times daily with a meal.       Discharge Assessment: Vitals:   07/23/19 0815 07/23/19 1400  BP:  (!) 152/79  Pulse:  (!) 104  Resp:  17  Temp:  98.2 F (36.8 C)  SpO2: 94% 93%   Skin clean, dry and intact without evidence of skin break down, no evidence of skin tears noted. IV catheter discontinued intact. Site without signs and symptoms of complications - no redness or edema noted at insertion site, patient denies c/o pain - only slight tenderness at site.  Dressing with slight pressure applied.  D/c Instructions-Education: Discharge instructions given to patient/family with verbalized understanding. D/c education completed with patient/family including follow up instructions, medication list, d/c activities limitations if indicated, with other d/c instructions as indicated by MD - patient able to verbalize understanding, all questions fully answered. Patient instructed to return to ED, call 911, or call MD for any changes in condition.  Patient escorted via [place], and D/C home via private auto.  Zachery Conch, RN 07/23/2019 4:14 PM

## 2019-07-23 NOTE — Progress Notes (Signed)
Physical Therapy Treatment Patient Details Name: Carrie Cook MRN: [id number] DOB: December 24, 1960 Today's Date: 07/23/2019  RIGHT KNEE ROM:  0 - 95 degrees AMBULATION DISTANCE: 100 feet using RW with Mod Independence/Supervision     History of Present Illness Carrie Cook is a 59 y/o female s/p Right TKA on 07/22/19 with the diagnosis of osteoarthritis right knee.    PT Comments    Patient demonstrates fair/good return for going up down 3 steps in stair having to use both hands on 1 side rail going up and leaning on therapist shoulders coming down without loss of balance.  Patient advised to have her spouse assist with understanding acknowledged for proper position of helper and step sequence when going up/down steps acknowledged. Patient demonstrates increased endurance/distance for gait training with improvement for RLE heel to toe stepping without loss of balance and increased right knee flexion while self stretching seated at bedside.  Patient will benefit from continued physical therapy in hospital and recommended venue below to increase strength, balance, endurance for safe ADLs and gait.   Follow Up Recommendations  Home health PT;Supervision for mobility/OOB;Supervision - Intermittent     Equipment Recommendations  None recommended by PT    Recommendations for Other Services       Precautions / Restrictions Precautions Precautions: Fall Restrictions Weight Bearing Restrictions: Yes RLE Weight Bearing: Weight bearing as tolerated    Mobility  Bed Mobility Overal bed mobility: Modified Independent                Transfers Overall transfer level: Modified independent                  Ambulation/Gait Ambulation/Gait assistance: Supervision;Modified independent (Device/Increase time) Gait Distance (Feet): 100 Feet Assistive device: Rolling walker (2 wheeled) Gait Pattern/deviations: Decreased step length - right;Decreased stance time -  right;Decreased stride length;Antalgic Gait velocity: decreased   General Gait Details: demonstrates increased endurance/distance for ambulation with fair/good return for right heel to toe stepping after verbal cues, limited secondary to c/o fatigue   Stairs Stairs: Yes Stairs assistance: Min assist Stair Management: One rail Right;Step to pattern Number of Stairs: 3 General stair comments: Patient demonstrates fair/good return for going up/down steps with hand held assistance and use of both hands on 1 siderail without loss of balance   Wheelchair Mobility    Modified Rankin (Stroke Patients Only)       Balance Overall balance assessment: Needs assistance Sitting-balance support: Feet supported;No upper extremity supported Sitting balance-Leahy Scale: Good Sitting balance - Comments: seated at EOB   Standing balance support: During functional activity;Bilateral upper extremity supported Standing balance-Leahy Scale: Fair Standing balance comment: fair/good using RW                            Cognition Arousal/Alertness: Awake/alert Behavior During Therapy: WFL for tasks assessed/performed Overall Cognitive Status: Within Functional Limits for tasks assessed                                        Exercises Total Joint Exercises Ankle Circles/Pumps: Supine;10 reps;Strengthening;AROM;Right Quad Sets: Supine;AROM;10 reps;Strengthening;Right Short Arc Quad: Supine;Right;Strengthening;AROM;10 reps Heel Slides: Supine;Right;Strengthening;AROM;10 reps Goniometric ROM: 0 - 95 degrees    General Comments        Pertinent Vitals/Pain Pain Assessment: 0-10 Pain Score: 4  Pain Location: right knee Pain Descriptors / Indicators:  Sore;Grimacing Pain Intervention(s): Limited activity within patient's tolerance;Monitored during session;Premedicated before session    Home Living                      Prior Function            PT Goals  (current goals can now be found in the care plan section) Acute Rehab PT Goals PT Goal Formulation: With patient Time For Goal Achievement: 07/24/19 Potential to Achieve Goals: Good Progress towards PT goals: Progressing toward goals    Frequency    BID      PT Plan Current plan remains appropriate    Co-evaluation              AM-PAC PT "6 Clicks" Mobility   Outcome Measure  Help needed turning from your back to your side while in a flat bed without using bedrails?: None Help needed moving from lying on your back to sitting on the side of a flat bed without using bedrails?: None Help needed moving to and from a bed to a chair (including a wheelchair)?: A Little Help needed standing up from a chair using your arms (e.g., wheelchair or bedside chair)?: None Help needed to walk in hospital room?: A Little Help needed climbing 3-5 steps with a railing? : A Little 6 Click Score: 21    End of Session   Activity Tolerance: Patient tolerated treatment well;Patient limited by fatigue Patient left: in bed;with call bell/phone within reach Nurse Communication: Mobility status PT Visit Diagnosis: Unsteadiness on feet (R26.81);Other abnormalities of gait and mobility (R26.89);Muscle weakness (generalized) (M62.81)     Time: 7017-7939 PT Time Calculation (min) (ACUTE ONLY): 36 min  Charges:  $Gait Training: 8-22 mins $Therapeutic Exercise: 8-22 mins                     10:29 AM, 07/23/19 Lonell Grandchild, MPT Physical Therapist with [facility] [phone] office [phone] mobile phone

## 2019-07-23 NOTE — Plan of Care (Signed)

## 2019-07-24 LAB — GLUCOSE, CAPILLARY: Glucose-Capillary: 216 mg/dL — ABNORMAL HIGH (ref 70–99)

## 2019-07-24 NOTE — Discharge Summary (Signed)
Physician Discharge Summary  Patient ID: Carrie Cook MRN: [id number] DOB/AGE: 57-Dec-1962 57 y.o.  Admit date: 07/22/2019 Discharge date: July 23, 2019 Admission Diagnoses: Osteoarthritis right knee Discharge Diagnoses: Same  Discharge condition stable  Procedure right total knee  Implants attune fixed-bearing total knee posterior stabilized size 4 femur size 4 tibia size 6 polyethylene insert  Hospital course patient had surgery on the day of admission  Did well with therapy had therapy on postop day 1 June 30 did well neurovascularly intact awake alert and oriented good walking ability       Discharge Exam: BP (!) 152/79 (BP Location: Left Arm)   Pulse (!) 104   Temp 98.2 F (36.8 C) (Oral)   Resp 17   Ht 5\' 3"  (1.6 m)   Wt 92.1 kg   LMP 07/10/2013   SpO2 93%   BMI 35.97 kg/m  Physical Exam  CBC Latest Ref Rng & Units 07/23/2019 07/18/2019 05/29/2019  WBC 4.0 - 10.5 K/uL 10.8(H) 8.9 6.2  Hemoglobin 12.0 - 15.0 g/dL 13.4 15.4(H) 15.9(H)  Hematocrit 36 - 46 % 40.7 45.1 48.0(H)  Platelets 150 - 400 K/uL 228 285 274    BMP Latest Ref Rng & Units 07/23/2019 07/18/2019 05/29/2019  Glucose 70 - 99 mg/dL 197(H) 247(H) 337(H)  BUN 6 - 20 mg/dL 8 14 8   Creatinine 0.44 - 1.00 mg/dL 0.64 0.66 0.79  BUN/Creat Ratio 9 - 23 - - -  Sodium 135 - 145 mmol/L 136 136 136  Potassium 3.5 - 5.1 mmol/L 4.3 4.3 4.0  Chloride 98 - 111 mmol/L 100 101 101  CO2 22 - 32 mmol/L 27 25 25   Calcium 8.9 - 10.3 mg/dL 8.5(L) 9.0 9.0     Disposition: Discharge disposition: 01-Home or Self Care       Discharge Instructions    Call MD / Call 911   Complete by: As directed    If you experience chest pain or shortness of breath, CALL 911 and be transported to the hospital emergency room.  If you develope a fever above 101 F, pus (white drainage) or increased drainage or redness at the wound, or calf pain, call your surgeon's office.   Constipation Prevention   Complete by: As directed     Drink plenty of fluids.  Prune juice may be helpful.  You may use a stool softener, such as Colace (over the counter) 100 mg twice a day.  Use MiraLax (over the counter) for constipation as needed.   Diet - low sodium heart healthy   Complete by: As directed    Discharge instructions   Complete by: As directed    Instructions for home  Do the exercises as prescribed by the therapist  Wound care, no showers no tub bath until the staples come out.  The staples will come out in 2 weeks.  You can leave the dressing on with the stockings on.  If the dressing becomes soiled then it can be changed.  You will apply 2-3 4 x 4's with tape to cover the wound and then reapply the stockings  The stockings will be worn for 4 weeks to prevent blood clots along with taking aspirin for 4 weeks  CPM machine 4 hours a day start at 60 degrees increase up to 120 degrees, increase 10 degrees every day until you reach the maximum amount on your CPM if you feel excessive pain in the machine reduce the number by 10  Pain medication: You have to opioid  pain medications tramadol and hydrocodone you also have gabapentin to decrease the amount of opioids that you are taking for the first 1 to 2 weeks use the hydrocodone as prescribed and take the tramadol continuously as prescribed  After 2 weeks take the tramadol and the gabapentin for pain  At the end of 6 weeks try to use Tylenol or Naprosyn or ibuprofen for pain relief this will prevent any opioid dependence or addiction  You also have a muscle relaxer and you have a prescription for MiraLAX and Colace to prevent constipation you should also drink plenty of fluids and add prune juice if needed   Increase activity slowly as tolerated   Complete by: As directed      Allergies as of 07/23/2019      Reactions   Qvar [beclomethasone] Shortness Of Breath   Patient states that this medication causes her to feel short of breath   Prozac [fluoxetine Hcl] Hives   Ace  Inhibitors Cough   Erythromycin Ethylsuccinate Hives, Nausea Only   Fluoxetine Hcl Other (See Comments)   unknown   Hydrochlorothiazide Cough   Lipitor [atorvastatin Calcium] Other (See Comments)   Body aches, flu-like symptoms   Dyazide [hydrochlorothiazide W-triamterene] Cough   Erythromycin Nausea Only   Lisinopril Cough   Pravastatin Other (See Comments)   BODY ACHES, FLU-LIKE SX.   Sulfa Antibiotics Rash   Sulfonamide Derivatives Hives, Rash      Medication List    TAKE these medications   albuterol 108 (90 Base) MCG/ACT inhaler Commonly known as: VENTOLIN HFA Inhale 2 puffs into the lungs every 6 (six) hours as needed for wheezing.   amLODipine 2.5 MG tablet Commonly known as: NORVASC Take one tablet po daily What changed:   how much to take  how to take this  when to take this  additional instructions   aspirin 81 MG chewable tablet Chew 1 tablet (81 mg total) by mouth 2 (two) times daily.   Calcium Citrate 200 MG Tabs Take 400 mg by mouth daily.   cetirizine 10 MG tablet Commonly known as: ZYRTEC Take 10 mg by mouth daily.   docusate sodium 100 MG capsule Commonly known as: COLACE Take 1 capsule (100 mg total) by mouth 2 (two) times daily.   ergocalciferol 1.25 MG (50000 UT) capsule Commonly known as: VITAMIN D2 Take 1 capsule (50,000 Units total) by mouth once a week. What changed: when to take this   estradiol 2 MG tablet Commonly known as: ESTRACE Take 1 tablet (2 mg total) by mouth at bedtime. What changed: when to take this   fluticasone 220 MCG/ACT inhaler Commonly known as: Flovent HFA Inhale 2 puffs into the lungs 2 (two) times daily. What changed:   when to take this  reasons to take this   gabapentin 300 MG capsule Commonly known as: NEURONTIN Take 1 capsule (300 mg total) by mouth 3 (three) times daily.   HYDROcodone-acetaminophen 7.5-325 MG tablet Commonly known as: NORCO Take 1 tablet by mouth every 4 (four) hours as  needed for up to 7 days for severe pain (pain score 7-10).   LaMICtal 100 MG tablet Generic drug: lamoTRIgine Take 100 mg by mouth 2 (two) times daily.   Lantus SoloStar 100 UNIT/ML Solostar Pen Generic drug: insulin glargine Use 24 units qhs. May titrate to 30 units qhs What changed:   how much to take  how to take this  when to take this  additional instructions   losartan 100 MG  tablet Commonly known as: COZAAR TAKE 1 TABLET BY MOUTH DAILY What changed:   how much to take  how to take this  when to take this  additional instructions   Melatonin 3 MG Subl Place 3 mg under the tongue at bedtime.   metFORMIN 500 MG tablet Commonly known as: GLUCOPHAGE TAKE 1 TABLET(500 MG) BY MOUTH TWICE DAILY WITH A MEAL What changed: See the new instructions.   methocarbamol 500 MG tablet Commonly known as: ROBAXIN Take 1 tablet (500 mg total) by mouth every 6 (six) hours as needed for muscle spasms.   multivitamin with minerals Tabs tablet Take 1 tablet by mouth daily.   Pen Needles 32G X 4 MM Misc 32 g by Does not apply route daily.   polyethylene glycol 17 g packet Commonly known as: MIRALAX / GLYCOLAX Take 17 g by mouth daily.   rosuvastatin 5 MG tablet Commonly known as: Crestor Take one tablet po every day What changed:   how much to take  how to take this  when to take this  additional instructions   traMADol 50 MG tablet Commonly known as: ULTRAM Take 1 tablet (50 mg total) by mouth every 6 (six) hours as needed for up to 7 days.   venlafaxine 75 MG tablet Commonly known as: EFFEXOR Take 75 mg by mouth 2 (two) times daily with a meal.        Signed: Arther Abbott 07/24/2019, 2:07 PM

## 2019-07-26 DIAGNOSIS — Z7982 Long term (current) use of aspirin: Secondary | ICD-10-CM | POA: Diagnosis not present

## 2019-07-26 DIAGNOSIS — G4733 Obstructive sleep apnea (adult) (pediatric): Secondary | ICD-10-CM | POA: Diagnosis not present

## 2019-07-26 DIAGNOSIS — D509 Iron deficiency anemia, unspecified: Secondary | ICD-10-CM | POA: Diagnosis not present

## 2019-07-26 DIAGNOSIS — N809 Endometriosis, unspecified: Secondary | ICD-10-CM | POA: Diagnosis not present

## 2019-07-26 DIAGNOSIS — Z79891 Long term (current) use of opiate analgesic: Secondary | ICD-10-CM | POA: Diagnosis not present

## 2019-07-26 DIAGNOSIS — R32 Unspecified urinary incontinence: Secondary | ICD-10-CM | POA: Diagnosis not present

## 2019-07-26 DIAGNOSIS — K219 Gastro-esophageal reflux disease without esophagitis: Secondary | ICD-10-CM | POA: Diagnosis not present

## 2019-07-26 DIAGNOSIS — Z7951 Long term (current) use of inhaled steroids: Secondary | ICD-10-CM | POA: Diagnosis not present

## 2019-07-26 DIAGNOSIS — E785 Hyperlipidemia, unspecified: Secondary | ICD-10-CM | POA: Diagnosis not present

## 2019-07-26 DIAGNOSIS — I10 Essential (primary) hypertension: Secondary | ICD-10-CM | POA: Diagnosis not present

## 2019-07-26 DIAGNOSIS — E119 Type 2 diabetes mellitus without complications: Secondary | ICD-10-CM | POA: Diagnosis not present

## 2019-07-26 DIAGNOSIS — D219 Benign neoplasm of connective and other soft tissue, unspecified: Secondary | ICD-10-CM | POA: Diagnosis not present

## 2019-07-26 DIAGNOSIS — Z471 Aftercare following joint replacement surgery: Secondary | ICD-10-CM | POA: Diagnosis not present

## 2019-07-26 DIAGNOSIS — J45909 Unspecified asthma, uncomplicated: Secondary | ICD-10-CM | POA: Diagnosis not present

## 2019-07-26 DIAGNOSIS — Z794 Long term (current) use of insulin: Secondary | ICD-10-CM | POA: Diagnosis not present

## 2019-07-26 DIAGNOSIS — Z96651 Presence of right artificial knee joint: Secondary | ICD-10-CM | POA: Diagnosis not present

## 2019-07-26 DIAGNOSIS — F329 Major depressive disorder, single episode, unspecified: Secondary | ICD-10-CM | POA: Diagnosis not present

## 2019-07-29 ENCOUNTER — Telehealth: Payer: Self-pay | Admitting: Orthopedic Surgery

## 2019-07-29 NOTE — Telephone Encounter (Signed)
Call received from [facility], physical therapist for verbal orders as follows:  1 Time a week for 1 week 3 Times a week for 3 weeks  ph# [phone]; may leave detailed message.

## 2019-07-29 NOTE — Telephone Encounter (Signed)
Called with verbal orders.  

## 2019-08-01 ENCOUNTER — Other Ambulatory Visit: Payer: Self-pay | Admitting: Family Medicine

## 2019-08-06 ENCOUNTER — Ambulatory Visit (INDEPENDENT_AMBULATORY_CARE_PROVIDER_SITE_OTHER): Payer: Medicare Other | Admitting: Orthopedic Surgery

## 2019-08-06 ENCOUNTER — Other Ambulatory Visit: Payer: Self-pay

## 2019-08-06 ENCOUNTER — Encounter: Payer: Self-pay | Admitting: Orthopedic Surgery

## 2019-08-06 VITALS — Ht 63.0 in | Wt 194.0 lb

## 2019-08-06 DIAGNOSIS — G8918 Other acute postprocedural pain: Secondary | ICD-10-CM

## 2019-08-06 MED ORDER — TRAMADOL HCL 50 MG PO TABS: 50.0000 mg | ORAL_TABLET | Freq: Four times a day (QID) | ORAL | 0 refills | Status: AC | PRN

## 2019-08-06 NOTE — Progress Notes (Signed)
Chief Complaint  Patient presents with  . Routine Post Op    DOS 07/22/19  . Medication Refill    ? tramadol/hydrocodone     POD 14 s/p RT TKA   Wound clean staples out   Refill tramadol  Start PT   Fu 4 weeks   Encounter Diagnosis  Name Primary?  . Post-op pain Yes    Meds ordered this encounter  Medications  . traMADol (ULTRAM) 50 MG tablet    Sig: Take 1 tablet (50 mg total) by mouth every 6 (six) hours as needed for up to 7 days.    Dispense:  28 tablet    Refill:  0

## 2019-08-11 ENCOUNTER — Encounter (HOSPITAL_COMMUNITY): Payer: Self-pay

## 2019-08-11 ENCOUNTER — Other Ambulatory Visit: Payer: Self-pay

## 2019-08-11 ENCOUNTER — Ambulatory Visit (HOSPITAL_COMMUNITY): Payer: Medicare Other | Attending: Orthopedic Surgery

## 2019-08-11 DIAGNOSIS — M25661 Stiffness of right knee, not elsewhere classified: Secondary | ICD-10-CM | POA: Insufficient documentation

## 2019-08-11 DIAGNOSIS — M25561 Pain in right knee: Secondary | ICD-10-CM | POA: Diagnosis not present

## 2019-08-11 DIAGNOSIS — R2689 Other abnormalities of gait and mobility: Secondary | ICD-10-CM | POA: Insufficient documentation

## 2019-08-11 NOTE — Therapy (Signed)
[place], [place], [address] Phone: [phone]   Fax:  [phone]   L Knee AROM: 0-100 degrees Ambulation: ~250 ft with [facility]  Physical Therapy Evaluation  Patient Details  Name: Carrie Cook MRN: [id number] Date of Birth: [birth year] Referring Provider (PT): Arther Abbott, MD   Encounter Date: 08/11/2019   PT End of Session - 08/11/19 1208    Visit Number 1    Number of Visits 18    Date for PT Re-Evaluation 09/26/19    Authorization Type Medicare Part A and B    Authorization Time Period 08/11/19 to 09/26/19    Progress Note Due on Visit 10    PT Start Time 0947    PT Stop Time 1029    PT Time Calculation (min) 42 min    Activity Tolerance Patient tolerated treatment well    Behavior During Therapy [facility] for tasks assessed/performed           Past Medical History:  Diagnosis Date  . Anemia    PT HAS HAD COLONOSCOPY AND ENDOSCOPY WORK UP - NO PROBLEMS FOUND AS SOURCE OF ANEMIA -- PT HAD IRON INFUSION AT [facility] 11/13/12-AFTER SEEING HEMATOLOGIST DR. [place] AND HE GAVE HEMATOLOGIC CLEARANCE FOR GASTRIC BYPASS SURGERY.  Marland Kitchen Anxiety   . Asthma    daily and prn inhalers  . Degenerative joint disease    right knee, spine - STATES INTERMITTENT  NUMBNESS DOWN RT LEG WITH PROLONGED STANDING OR WALKING- THINKS RELATED TO HER SPINE PROBLEMS  . Dental crowns present   . Depression   . Family history of anesthesia complication    pt's mother and sister have hx. of post-op N/V  . Fibroids    UTERINE  . Gastroesophageal reflux disease    none for 2 years since Bariatric surgery.  . H/O hiatal hernia   . History of endometriosis   . History of kidney stones   . Hyperlipidemia   . Hypertension    under control with med., has been on med. x 2 yr.  . Insulin dependent diabetes mellitus   . Lock jaw    jaw locks open if opens mouth wide  . Migraines   . Neuropathy    FEET  . Obesity   . Palpitations     . PONV (postoperative nausea and vomiting)   . Shortness of breath    with daily activities  . Sleep apnea    post-op didn't need CPAP but now states needs it again but not using    Past Surgical History:  Procedure Laterality Date  . ABDOMINAL HYSTERECTOMY N/A 06/06/2017   Procedure: HYSTERECTOMY ABDOMINAL;  Surgeon: Florian Buff, MD;  Location: [facility];  Service: Gynecology;  Laterality: N/A;  . BREATH TEK H PYLORI N/A 08/13/2012   Procedure: BREATH TEK H PYLORI;  Surgeon: Edward Jolly, MD;  Location: [facility];  Service: General;  Laterality: N/A;  . CARPAL TUNNEL RELEASE  01/03/2012   Procedure: CARPAL TUNNEL RELEASE;  Surgeon: Wynonia Sours, MD;  Location: [facility];  Service: Orthopedics;  Laterality: Right;  . carpal tunnel release right hand Right 12/13  . COLONOSCOPY  05/2009   ONG:EXBMWUXL hemorrhoids otherwise normal colon, rectum and terminal ileum  . COLONOSCOPY WITH ESOPHAGOGASTRODUODENOSCOPY (EGD) N/A 10/28/2012   Procedure: COLONOSCOPY WITH ESOPHAGOGASTRODUODENOSCOPY (EGD);  Surgeon: Daneil Dolin, MD;  [facility];  Service: Endoscopy;  Laterality: N/A;  7:30  . DILITATION &  CURRETTAGE/HYSTROSCOPY WITH NOVASURE ABLATION N/A 07/22/2014   Procedure: DILATATION & CURETTAGE/HYSTEROSCOPY WITH NOVASURE ABLATION; uterine length 6.0 cm; uterine width 3.8 cm; total ablation time 1 minute 15 seconds;  Surgeon: Florian Buff, MD;  Location: [facility];  Service: Gynecology;  Laterality: N/A;  . ESOPHAGOGASTRODUODENOSCOPY  5/013/2011   MVH:QIONGE esophagus/multiple polyps removed s/p (hyperplastic). Gastritis without H.pylori.  . ESOPHAGOGASTRODUODENOSCOPY (EGD) WITH PROPOFOL N/A 06/06/2018   with LA Grade A esophagitis s/p dilation.   Marland Kitchen GASTRIC ROUX-EN-Y N/A 11/19/2012   Procedure: LAPAROSCOPIC ROUX-EN-Y GASTRIC;  Surgeon: Edward Jolly, MD;  Location: [facility];  Service: General;  Laterality: N/A;  . GIVENS CAPSULE STUDY N/A 10/28/2012    Procedure: GIVENS CAPSULE STUDY;  Surgeon: Daneil Dolin, MD;  [facility];  Service: Endoscopy;  Laterality: N/A;  . KNEE ARTHROSCOPY WITH MEDIAL MENISECTOMY Right 10/28/2015   Procedure: RIGHT KNEE ARTHROSCOPY WITH MEDIAL MENISECTOMY;  Surgeon: Carole Civil, MD;  Location[facility];  Service: Orthopedics;  Laterality: Right;  . LAPAROSCOPY     due to endometriosis  . MALONEY DILATION N/A 06/06/2018   Procedure: Venia Minks DILATION;  Surgeon: Daneil Dolin, MD;  [facility];  Service: Endoscopy;  Laterality: N/A;  . PELVIC LAPAROSCOPY  11/04/1999   with fulguration of endometriosis  . SALPINGOOPHORECTOMY Bilateral 06/06/2017   Procedure: SALPINGO OOPHORECTOMY;  Surgeon: Florian Buff, MD;  Location: [facility];  Service: Gynecology;  Laterality: Bilateral;  . TOTAL KNEE ARTHROPLASTY Right 07/22/2019   Procedure: TOTAL KNEE ARTHROPLASTY;  Surgeon: Carole Civil, MD;  Location: [facility];  Service: Orthopedics;  Laterality: Right;  . TRIGGER FINGER RELEASE Right 09/24/2012   Procedure: RELEASE A-1 PULLEY OF RIGHT THUMB;  Surgeon: Wynonia Sours, MD;  Location: [facility];  Service: Orthopedics;  Laterality: Right;  . TUBAL LIGATION  1993    There were no vitals filed for this visit.    Subjective Assessment - 08/11/19 0951    Subjective Pt reports had R TKA 07/22/19; 3 years prior has scope and meniscal removal. Prior to surgery R knee buckled or decreased R swing due to pain resulting in 1 fall/month, not using AD. Pt reports no falls since surgery using RW and has weaned to [facility]. Pt reports completing steps to enter home without difficulty. Pt reports taking tramadol due to pain at night, limited in sleep; pt is using icepack to alleviate throbbing pain at night. Pt reports completing light household chores, requires increased time to complete or allows spouse to assist. Pt reports still using CPM machine 0-95 deg    How long can you sit comfortably? 30  minutes    How long can you stand comfortably? 20 minutes    How long can you walk comfortably? no issues    Patient Stated Goals get full ROM and be able to walk for exercise    Currently in Pain? Yes    Pain Score 4     Pain Location Knee    Pain Orientation Right    Pain Descriptors / Indicators Tightness;Throbbing    Pain Type Surgical pain    Pain Onset 1 to 4 weeks ago    Pain Frequency Constant    Aggravating Factors  laying down, prlonged knee extension    Pain Relieving Factors ice, pain medication    Effect of Pain on Daily Activities limited              [facility] PT Assessment - 08/11/19 0001  Assessment   Medical Diagnosis R TKA    Referring Provider (PT) Arther Abbott, MD    Onset Date/Surgical Date 07/22/19    Next MD Visit Mid August 2021    Prior Therapy acute and HHPT with good results      Precautions   Precautions None      Restrictions   Weight Bearing Restrictions No      Balance Screen   Has the patient fallen in the past 6 months Yes   pre surgery   How many times? 1x/month    Has the patient had a decrease in activity level because of a fear of falling?  No    Is the patient reluctant to leave their home because of a fear of falling?  No      Prior Function   Level of Independence Independent with household mobility with device    Leisure walking, driving grandkids around      Cognition   Overall Cognitive Status Within Functional Limits for tasks assessed      Observation/Other Assessments   Observations incision intact, no drainage, no bruising noted; knee joint warm to touch, no errythema    Focus on Therapeutic Outcomes (FOTO)  57% limited      Observation/Other Assessments-Edema    Edema Circumferential      Circumferential Edema   Circumferential - Right 14.25 inches    Circumferential - Left  12.75 inches      Sensation   Light Touch Impaired Detail    Additional Comments decreased sensation lateral knee down to mid calf       Functional Tests   Functional tests Sit to Stand      Sit to Stand   Comments 5x STS: 11.62 sec, BUE assisting, RLE extended 1-2 inches, no increased pain      ROM / Strength   AROM / PROM / Strength AROM;Strength      AROM   AROM Assessment Site Knee    Right/Left Knee Right    Right Knee Extension 7   from extension   Right Knee Flexion 100      Strength   Strength Assessment Site Hip;Knee;Ankle    Right Hip Flexion 4+/5    Right Hip ABduction 5/5    Left Hip Flexion 4+/5    Left Hip ABduction 5/5    Right Knee Flexion 4+/5    Right Knee Extension 4+/5    Left Knee Flexion 5/5    Left Knee Extension 5/5    Right Ankle Dorsiflexion 5/5    Left Ankle Dorsiflexion 5/5      Palpation   Patella mobility good mobility, no restrictions noted    Palpation comment TTP throughout medial knee joint down to mid calf      Ambulation/Gait   Ambulation/Gait Yes    Ambulation/Gait Assistance 6: Modified independent (Device/Increase time)    Ambulation Distance (Feet) 226 Feet    Assistive device Straight cane    Gait Pattern Step-through pattern;Decreased stance time - right;Decreased stride length;Decreased hip/knee flexion - right;Decreased weight shift to right    Ambulation Surface Level;Indoor    Gait velocity 0.90 m/s    Gait Comments decreased weight-shift and stance on RLE initially, slightly improves with distance; SPC for steadying, no loss of balance or falls      Balance   Balance Assessed Yes      Static Standing Balance   Static Standing - Balance Support No upper extremity supported    Static  Standing Balance -  Activities  Single Leg Stance - Right Leg;Single Leg Stance - Left Leg    Static Standing - Comment/# of Minutes R: 8 sec no pain, L: 30+ sec            Objective measurements completed on examination: See above findings.        PT Education - 08/11/19 1207    Education Details Assessment findings, FOTO findings, reviewed HEP, artificial  knee ROM, s/s of infection and DVT. Discussed desensitization techniques to improve painful sensation along medial knee joint and lower leg.    Person(s) Educated Patient    Methods Explanation;Demonstration    Comprehension Verbalized understanding;Returned demonstration            PT Short Term Goals - 08/11/19 1217      PT SHORT TERM GOAL #1   Title Pt will perform HEP correctly and consistently to improve ROM, strength, ambulation tolerance and standing tolerance.    Time 3    Period Weeks    Status New    Target Date 09/01/19      PT SHORT TERM GOAL #2   Title Pt will demonstrate R knee AROM 0-100 deg to improve ability to perform functional activities around the home.    Time 3    Period Weeks    Status New             PT Long Term Goals - 08/11/19 1219      PT LONG TERM GOAL #1   Title Pt will demonstrate R knee AROM 0-120 deg to improve ability to perform functional activities around the home.    Time 6    Period Weeks    Status New    Target Date 09/26/19      PT LONG TERM GOAL #2   Title Pt will improve R knee strength to equal L knee strength to improve ability to perform transfers and ambulation without compensations.    Time 6    Period Weeks    Status New      PT LONG TERM GOAL #3   Title Pt will ambulate at 1.0 m/s without AD to allow pt to begin walking program for personal exercise program.    Time 6    Period Weeks    Status New                  Plan - 08/11/19 1209    Clinical Impression Statement Pt is a pleasant 59YO female s/p R TKA on 07/22/19. Pt demonstrates deficits in R knee AROM, strength, balance, dependence on SPC for gait, impaired transfers, and increased pain with mobility. Pt requiring increased time to complete 5x STS and requiring BUE assisting to rise and for controlled lowering. Pt ambulates at 0.9 m/s cadence, but using SPC and demonstrates decreased weight-shift to RLE. Pt would benefit from skilled PT interventions  to improve deficits noted and return to PLOF.    Examination-Activity Limitations Locomotion Level;Sleep;Stand    Examination-Participation Restrictions Cleaning;Driving;Laundry    Stability/Clinical Decision Making Stable/Uncomplicated    Clinical Decision Making Low    Rehab Potential Good    PT Frequency 3x / week    PT Duration 6 weeks    PT Treatment/Interventions ADLs/Self Care Home Management;Aquatic Therapy;Biofeedback;Cryotherapy;Electrical Stimulation;Moist Heat;DME Instruction;Gait training;Stair training;Functional mobility training;Therapeutic activities;Therapeutic exercise;Balance training;Neuromuscular re-education;Patient/family education;Manual techniques;Scar mobilization;Passive range of motion;Dry needling;Taping;Joint Manipulations    PT Next Visit Plan Begin R quad strengthening, focus on gaining ROM, stretching. Use  manual for pain control and to gain ROM as needed.    PT Home Exercise Plan Eval: quad set, SLR, heel slides    Consulted and Agree with Plan of Care Patient           Patient will benefit from skilled therapeutic intervention in order to improve the following deficits and impairments:  Abnormal gait, Decreased activity tolerance, Decreased balance, Decreased range of motion, Decreased strength, Hypomobility, Increased edema, Impaired perceived functional ability, Impaired sensation, Pain  Visit Diagnosis: Acute pain of right knee  Stiffness of right knee, not elsewhere classified  Other abnormalities of gait and mobility     Problem List Patient Active Problem List   Diagnosis Date Noted  . Primary localized osteoarthritis of left knee 07/22/2019  . Primary osteoarthritis of right knee   . Constipation 09/05/2018  . Abdominal pain, chronic, epigastric 05/27/2018  . Dysphagia 05/27/2018  . Depression, major, single episode, moderate ([place]) 03/22/2018  . S/P complete hysterectomy 06/06/2017  . Derangement of posterior horn of medial meniscus of  right knee   . Right knee pain 09/10/2015  . Iron deficiency anemia 10/16/2013  . Fibroids 08/21/2013  . Excessive or frequent menstruation 06/19/2013  . Morbid obesity ([place]) 07/12/2012  . Obstructive sleep apnea 06/21/2012  . Chest pain   . Hyperlipidemia   . Type 2 diabetes mellitus ([place])   . Obesity   . Laboratory test   . Nephrolithiasis   . Asthma   . Endometriosis   . Hypertension   . Iron deficiency anemia 05/11/2009  . GERD 05/11/2009     Talbot Grumbling PT, DPT 08/11/19, 12:25 PM [place] [address] [place], [place], [address] Phone: [phone]   Fax:  [phone]  Name: LEHUA FLORES MRN: [id number] Date of Birth: [birth year]

## 2019-08-13 ENCOUNTER — Ambulatory Visit (HOSPITAL_COMMUNITY): Payer: Medicare Other | Admitting: Physical Therapy

## 2019-08-13 ENCOUNTER — Encounter (HOSPITAL_COMMUNITY): Payer: Self-pay | Admitting: Physical Therapy

## 2019-08-13 ENCOUNTER — Other Ambulatory Visit: Payer: Self-pay

## 2019-08-13 DIAGNOSIS — M25561 Pain in right knee: Secondary | ICD-10-CM | POA: Diagnosis not present

## 2019-08-13 DIAGNOSIS — M25661 Stiffness of right knee, not elsewhere classified: Secondary | ICD-10-CM

## 2019-08-13 DIAGNOSIS — R2689 Other abnormalities of gait and mobility: Secondary | ICD-10-CM

## 2019-08-13 NOTE — Therapy (Signed)
[place], [place], [address] Phone: [phone]   Fax:  [phone] Knee Extension Right;AROM   Knee Extension Limitations 5   Knee Flexion Right;AROM   Knee Flexion Limitations 115    Gait Training 250 feet with [facility]     Physical Therapy Treatment  Patient Details  Name: Carrie Cook MRN: [id number] Date of Birth: September 20, 1960 Referring Provider (PT): Arther Abbott, MD   Encounter Date: 08/13/2019   PT End of Session - 08/13/19 1117    Visit Number 2    Number of Visits 18    Date for PT Re-Evaluation 09/26/19    Authorization Type Medicare Part A and B    Authorization Time Period 08/11/19 to 09/26/19    Progress Note Due on Visit 10    PT Start Time 1118    PT Stop Time 1200    PT Time Calculation (min) 42 min    Activity Tolerance Patient tolerated treatment well    Behavior During Therapy [facility] for tasks assessed/performed           Past Medical History:  Diagnosis Date  . Anemia    PT HAS HAD COLONOSCOPY AND ENDOSCOPY WORK UP - NO PROBLEMS FOUND AS SOURCE OF ANEMIA -- PT HAD IRON INFUSION AT [facility] 11/13/12-AFTER SEEING HEMATOLOGIST DR. [place] AND HE GAVE HEMATOLOGIC CLEARANCE FOR GASTRIC BYPASS SURGERY.  Marland Kitchen Anxiety   . Asthma    daily and prn inhalers  . Degenerative joint disease    right knee, spine - STATES INTERMITTENT  NUMBNESS DOWN RT LEG WITH PROLONGED STANDING OR WALKING- THINKS RELATED TO HER SPINE PROBLEMS  . Dental crowns present   . Depression   . Family history of anesthesia complication    pt's mother and sister have hx. of post-op N/V  . Fibroids    UTERINE  . Gastroesophageal reflux disease    none for 2 years since Bariatric surgery.  . H/O hiatal hernia   . History of endometriosis   . History of kidney stones   . Hyperlipidemia   . Hypertension    under control with med., has been on med. x 2 yr.  . Insulin dependent diabetes mellitus   . Lock jaw    jaw locks  open if opens mouth wide  . Migraines   . Neuropathy    FEET  . Obesity   . Palpitations   . PONV (postoperative nausea and vomiting)   . Shortness of breath    with daily activities  . Sleep apnea    post-op didn't need CPAP but now states needs it again but not using    Past Surgical History:  Procedure Laterality Date  . ABDOMINAL HYSTERECTOMY N/A 06/06/2017   Procedure: HYSTERECTOMY ABDOMINAL;  Surgeon: Florian Buff, MD;  [facility];  Service: Gynecology;  Laterality: N/A;  . BREATH TEK H PYLORI N/A 08/13/2012   Procedure: BREATH TEK H PYLORI;  Surgeon: Edward Jolly, MD;  [facility];  Service: General;  Laterality: N/A;  . CARPAL TUNNEL RELEASE  01/03/2012   Procedure: CARPAL TUNNEL RELEASE;  Surgeon: Wynonia Sours, MD;  Location: [facility];  Service: Orthopedics;  Laterality: Right;  . carpal tunnel release right hand Right 12/13  . COLONOSCOPY  05/2009   [id number] hemorrhoids otherwise normal colon, rectum and terminal ileum  . COLONOSCOPY WITH ESOPHAGOGASTRODUODENOSCOPY (EGD) N/A 10/28/2012   Procedure: COLONOSCOPY WITH ESOPHAGOGASTRODUODENOSCOPY (EGD);  Surgeon: Cristopher Estimable  Rourk, MD;  [facility];  Service: Endoscopy;  Laterality: N/A;  7:30  . DILITATION & CURRETTAGE/HYSTROSCOPY WITH NOVASURE ABLATION N/A 07/22/2014   Procedure: DILATATION & CURETTAGE/HYSTEROSCOPY WITH NOVASURE ABLATION; uterine length 6.0 cm; uterine width 3.8 cm; total ablation time 1 minute 15 seconds;  Surgeon: Florian Buff, MD;  [facility];  Service: Gynecology;  Laterality: N/A;  . ESOPHAGOGASTRODUODENOSCOPY  5/013/2011   NUU:[id number] esophagus/multiple polyps removed s/p (hyperplastic). Gastritis without H.pylori.  . ESOPHAGOGASTRODUODENOSCOPY (EGD) WITH PROPOFOL N/A 06/06/2018   with LA Grade A esophagitis s/p dilation.   Marland Kitchen GASTRIC ROUX-EN-Y N/A 11/19/2012   Procedure: LAPAROSCOPIC ROUX-EN-Y GASTRIC;  Surgeon: Edward Jolly, MD;  [facility];  Service: General;  Laterality: N/A;  . GIVENS CAPSULE STUDY N/A 10/28/2012   Procedure: GIVENS CAPSULE STUDY;  Surgeon: Daneil Dolin, MD;  [facility];  Service: Endoscopy;  Laterality: N/A;  . KNEE ARTHROSCOPY WITH MEDIAL MENISECTOMY Right 10/28/2015   Procedure: RIGHT KNEE ARTHROSCOPY WITH MEDIAL MENISECTOMY;  Surgeon: Carole Civil, MD;  [facility];  Service: Orthopedics;  Laterality: Right;  . LAPAROSCOPY     due to endometriosis  . MALONEY DILATION N/A 06/06/2018   Procedure: Venia Minks DILATION;  Surgeon: Daneil Dolin, MD;  [facility];  Service: Endoscopy;  Laterality: N/A;  . PELVIC LAPAROSCOPY  11/04/1999   with fulguration of endometriosis  . SALPINGOOPHORECTOMY Bilateral 06/06/2017   Procedure: SALPINGO OOPHORECTOMY;  Surgeon: Florian Buff, MD;  [facility];  Service: Gynecology;  Laterality: Bilateral;  . TOTAL KNEE ARTHROPLASTY Right 07/22/2019   Procedure: TOTAL KNEE ARTHROPLASTY;  Surgeon: Carole Civil, MD;  [facility];  Service: Orthopedics;  Laterality: Right;  . TRIGGER FINGER RELEASE Right 09/24/2012   Procedure: RELEASE A-1 PULLEY OF RIGHT THUMB;  Surgeon: Wynonia Sours, MD;  Location: [facility];  Service: Orthopedics;  Laterality: Right;  . TUBAL LIGATION  1993    There were no vitals filed for this visit.   Subjective Assessment - 08/13/19 1121    Subjective Patient says knee is sore today. Says she thinks she slept funny, and was uncomfortable at night. Says HEP is going fine.    How long can you sit comfortably? 30 minutes    How long can you stand comfortably? 20 minutes    How long can you walk comfortably? no issues    Patient Stated Goals get full ROM and be able to walk for exercise    Currently in Pain? Yes    Pain Score 5     Pain Location Knee    Pain Orientation Right    Pain Descriptors / Indicators Aching;Sore    Pain Type Surgical pain    Pain Onset 1 to 4 weeks ago    Pain  Frequency Constant                             [facility] Adult PT Treatment/Exercise - 08/13/19 0001      Exercises   Exercises Knee/Hip      Knee/Hip Exercises: Standing   Heel Raises Both;15 reps    Gait Training 250 feet with SPC       Knee/Hip Exercises: Seated   Sit to Sand 1 set;15 reps;with UE support      Knee/Hip Exercises: Supine   Quad Sets Right;1 set;15 reps    Quad Sets Limitations 5" hold  Heel Slides Right;15 reps    Bridges Both;1 set;10 reps    Straight Leg Raises Right;1 set;15 reps    Knee Extension Right;AROM    Knee Extension Limitations 5    Knee Flexion Right;AROM    Knee Flexion Limitations 115    Other Supine Knee/Hip Exercises heel prop with towel 2 min       Manual Therapy   Manual Therapy Edema management    Manual therapy comments Manuals performed separate from all other activity     Edema Management retrograde message to RT knee for improved fluid return and decreased pain                     PT Short Term Goals - 08/11/19 1217      PT SHORT TERM GOAL #1   Title Pt will perform HEP correctly and consistently to improve ROM, strength, ambulation tolerance and standing tolerance.    Time 3    Period Weeks    Status New    Target Date 09/01/19      PT SHORT TERM GOAL #2   Title Pt will demonstrate R knee AROM 0-100 deg to improve ability to perform functional activities around the home.    Time 3    Period Weeks    Status New             PT Long Term Goals - 08/11/19 1219      PT LONG TERM GOAL #1   Title Pt will demonstrate R knee AROM 0-120 deg to improve ability to perform functional activities around the home.    Time 6    Period Weeks    Status New    Target Date 09/26/19      PT LONG TERM GOAL #2   Title Pt will improve R knee strength to equal L knee strength to improve ability to perform transfers and ambulation without compensations.    Time 6    Period Weeks    Status New      PT  LONG TERM GOAL #3   Title Pt will ambulate at 1.0 m/s without AD to allow pt to begin walking program for personal exercise program.    Time 6    Period Weeks    Status New                 Plan - 08/13/19 1207    Clinical Impression Statement Patient tolerated session well today and shows good progress toward therapy goals. Reviewed goals and HEP today. Initiated OP ther ex program. Patient with overall good tolerance. Increased discomfort and noted restriction with knee extension during heel prop. Educated patient on importance of compliance with stretching HEP for restoring full knee AROM. Added manual therapy to address edema and pain, patient reports decreased pain with walking post session. Patient will continue to benefit from skilled therapy services to progress knee strength and mobility to reduce pain and improve functional mobility.    Examination-Activity Limitations Locomotion Level;Sleep;Stand    Examination-Participation Restrictions Cleaning;Driving;Laundry    Stability/Clinical Decision Making Stable/Uncomplicated    Rehab Potential Good    PT Frequency 3x / week    PT Duration 6 weeks    PT Treatment/Interventions ADLs/Self Care Home Management;Aquatic Therapy;Biofeedback;Cryotherapy;Electrical Stimulation;Moist Heat;DME Instruction;Gait training;Stair training;Functional mobility training;Therapeutic activities;Therapeutic exercise;Balance training;Neuromuscular re-education;Patient/family education;Manual techniques;Scar mobilization;Passive range of motion;Dry needling;Taping;Joint Manipulations    PT Next Visit Plan Begin R quad strengthening, focus on gaining ROM, stretching. Use manual for  pain control and to gain ROM as needed. Add standing calf stretch and knee drivers next visit    PT Home Exercise Plan Eval: quad set, SLR, heel slides    Consulted and Agree with Plan of Care Patient           Patient will benefit from skilled therapeutic intervention in  order to improve the following deficits and impairments:  Abnormal gait, Decreased activity tolerance, Decreased balance, Decreased range of motion, Decreased strength, Hypomobility, Increased edema, Impaired perceived functional ability, Impaired sensation, Pain  Visit Diagnosis: Acute pain of right knee  Stiffness of right knee, not elsewhere classified  Other abnormalities of gait and mobility     Problem List Patient Active Problem List   Diagnosis Date Noted  . Primary localized osteoarthritis of left knee 07/22/2019  . Primary osteoarthritis of right knee   . Constipation 09/05/2018  . Abdominal pain, chronic, epigastric 05/27/2018  . Dysphagia 05/27/2018  . Depression, major, single episode, moderate ([place]) 03/22/2018  . S/P complete hysterectomy 06/06/2017  . Derangement of posterior horn of medial meniscus of right knee   . Right knee pain 09/10/2015  . Iron deficiency anemia 10/16/2013  . Fibroids 08/21/2013  . Excessive or frequent menstruation 06/19/2013  . Morbid obesity ([place]) 07/12/2012  . Obstructive sleep apnea 06/21/2012  . Chest pain   . Hyperlipidemia   . Type 2 diabetes mellitus ([place])   . Obesity   . Laboratory test   . Nephrolithiasis   . Asthma   . Endometriosis   . Hypertension   . Iron deficiency anemia 05/11/2009  . GERD 05/11/2009   12:13 PM, 08/13/19 Josue Hector PT DPT  Physical Therapist with [facility]  [phone] [place] [address] [place], [place], [address] Phone: [phone]   Fax:  [phone]  Name: JASHANTI CLINKSCALE MRN: [id number] Date of Birth: 21-Jan-1961

## 2019-08-14 ENCOUNTER — Encounter (HOSPITAL_COMMUNITY): Payer: Self-pay

## 2019-08-14 ENCOUNTER — Ambulatory Visit (HOSPITAL_COMMUNITY): Payer: Medicare Other

## 2019-08-14 DIAGNOSIS — M25561 Pain in right knee: Secondary | ICD-10-CM

## 2019-08-14 DIAGNOSIS — R2689 Other abnormalities of gait and mobility: Secondary | ICD-10-CM

## 2019-08-14 DIAGNOSIS — M25661 Stiffness of right knee, not elsewhere classified: Secondary | ICD-10-CM

## 2019-08-14 NOTE — Therapy (Signed)
[place], [place], [address] Phone: [phone]   Fax:  [phone]   Knee AROM: 5-112 deg Ambulation: 500 ft with [facility] Physical Therapy Treatment  Patient Details  Name: Carrie Cook MRN: [id number] Date of Birth: August 24, 1960 Referring Provider (PT): Arther Abbott, MD   Encounter Date: 08/14/2019   PT End of Session - 08/14/19 0958    Visit Number 3    Number of Visits 18    Date for PT Re-Evaluation 09/26/19    Authorization Type Medicare Part A and B    Authorization Time Period 08/11/19 to 09/26/19    Progress Note Due on Visit 10    PT Start Time 0953    PT Stop Time 1031    PT Time Calculation (min) 38 min    Activity Tolerance Patient tolerated treatment well    Behavior During Therapy [facility] for tasks assessed/performed           Past Medical History:  Diagnosis Date  . Anemia    PT HAS HAD COLONOSCOPY AND ENDOSCOPY WORK UP - NO PROBLEMS FOUND AS SOURCE OF ANEMIA -- PT HAD IRON INFUSION AT [facility] 11/13/12-AFTER SEEING HEMATOLOGIST DR. [place] AND HE GAVE HEMATOLOGIC CLEARANCE FOR GASTRIC BYPASS SURGERY.  Marland Kitchen Anxiety   . Asthma    daily and prn inhalers  . Degenerative joint disease    right knee, spine - STATES INTERMITTENT  NUMBNESS DOWN RT LEG WITH PROLONGED STANDING OR WALKING- THINKS RELATED TO HER SPINE PROBLEMS  . Dental crowns present   . Depression   . Family history of anesthesia complication    pt's mother and sister have hx. of post-op N/V  . Fibroids    UTERINE  . Gastroesophageal reflux disease    none for 2 years since Bariatric surgery.  . H/O hiatal hernia   . History of endometriosis   . History of kidney stones   . Hyperlipidemia   . Hypertension    under control with med., has been on med. x 2 yr.  . Insulin dependent diabetes mellitus   . Lock jaw    jaw locks open if opens mouth wide  . Migraines   . Neuropathy    FEET  . Obesity   . Palpitations   . PONV  (postoperative nausea and vomiting)   . Shortness of breath    with daily activities  . Sleep apnea    post-op didn't need CPAP but now states needs it again but not using    Past Surgical History:  Procedure Laterality Date  . ABDOMINAL HYSTERECTOMY N/A 06/06/2017   Procedure: HYSTERECTOMY ABDOMINAL;  Surgeon: Florian Buff, MD;  [facility];  Service: Gynecology;  Laterality: N/A;  . BREATH TEK H PYLORI N/A 08/13/2012   Procedure: BREATH TEK H PYLORI;  Surgeon: Edward Jolly, MD;  [facility];  Service: General;  Laterality: N/A;  . CARPAL TUNNEL RELEASE  01/03/2012   Procedure: CARPAL TUNNEL RELEASE;  Surgeon: Wynonia Sours, MD;  Location: [facility];  Service: Orthopedics;  Laterality: Right;  . carpal tunnel release right hand Right 12/13  . COLONOSCOPY  05/2009   LGX:[id number] hemorrhoids otherwise normal colon, rectum and terminal ileum  . COLONOSCOPY WITH ESOPHAGOGASTRODUODENOSCOPY (EGD) N/A 10/28/2012   Procedure: COLONOSCOPY WITH ESOPHAGOGASTRODUODENOSCOPY (EGD);  Surgeon: Daneil Dolin, MD;  [facility];  Service: Endoscopy;  Laterality: N/A;  7:30  . DILITATION & CURRETTAGE/HYSTROSCOPY WITH NOVASURE  ABLATION N/A 07/22/2014   Procedure: DILATATION & CURETTAGE/HYSTEROSCOPY WITH NOVASURE ABLATION; uterine length 6.0 cm; uterine width 3.8 cm; total ablation time 1 minute 15 seconds;  Surgeon: Florian Buff, MD;  [facility];  Service: Gynecology;  Laterality: N/A;  . ESOPHAGOGASTRODUODENOSCOPY  5/013/2011   ZOX:[id number] esophagus/multiple polyps removed s/p (hyperplastic). Gastritis without H.pylori.  . ESOPHAGOGASTRODUODENOSCOPY (EGD) WITH PROPOFOL N/A 06/06/2018   with LA Grade A esophagitis s/p dilation.   Marland Kitchen GASTRIC ROUX-EN-Y N/A 11/19/2012   Procedure: LAPAROSCOPIC ROUX-EN-Y GASTRIC;  Surgeon: Edward Jolly, MD;  [facility];  Service: General;  Laterality: N/A;  . GIVENS CAPSULE STUDY N/A 10/28/2012   Procedure: GIVENS  CAPSULE STUDY;  Surgeon: Daneil Dolin, MD;  [facility];  Service: Endoscopy;  Laterality: N/A;  . KNEE ARTHROSCOPY WITH MEDIAL MENISECTOMY Right 10/28/2015   Procedure: RIGHT KNEE ARTHROSCOPY WITH MEDIAL MENISECTOMY;  Surgeon: Carole Civil, MD;  [facility];  Service: Orthopedics;  Laterality: Right;  . LAPAROSCOPY     due to endometriosis  . MALONEY DILATION N/A 06/06/2018   Procedure: Venia Minks DILATION;  Surgeon: Daneil Dolin, MD;  [facility];  Service: Endoscopy;  Laterality: N/A;  . PELVIC LAPAROSCOPY  11/04/1999   with fulguration of endometriosis  . SALPINGOOPHORECTOMY Bilateral 06/06/2017   Procedure: SALPINGO OOPHORECTOMY;  Surgeon: Florian Buff, MD;  [facility];  Service: Gynecology;  Laterality: Bilateral;  . TOTAL KNEE ARTHROPLASTY Right 07/22/2019   Procedure: TOTAL KNEE ARTHROPLASTY;  Surgeon: Carole Civil, MD;  [facility];  Service: Orthopedics;  Laterality: Right;  . TRIGGER FINGER RELEASE Right 09/24/2012   Procedure: RELEASE A-1 PULLEY OF RIGHT THUMB;  Surgeon: Wynonia Sours, MD;  Location: [facility];  Service: Orthopedics;  Laterality: Right;  . TUBAL LIGATION  1993    There were no vitals filed for this visit.   Subjective Assessment - 08/14/19 1000    Subjective Pt reports she overdid it walking around the home without her [facility] yesterday and is sore today. Pt reports she is still icing and resting it at night.    How long can you sit comfortably? 30 minutes    How long can you stand comfortably? 20 minutes    How long can you walk comfortably? no issues    Patient Stated Goals get full ROM and be able to walk for exercise    Currently in Pain? Yes    Pain Score 4     Pain Location Knee    Pain Orientation Right    Pain Descriptors / Indicators Sore    Pain Onset 1 to 4 weeks ago    Pain Frequency Constant    Aggravating Factors  laying down, prolonged knee extension    Pain Relieving Factors  ice, pain medication    Effect of Pain on Daily Activities limited                   [facility] Adult PT Treatment/Exercise - 08/14/19 0001      Ambulation/Gait   Stairs Yes    Stair Management Technique One rail Right;Alternating pattern;Step to pattern    Number of Stairs 8    Height of Stairs 6    Gait Comments step to pattern, progressing to reciprocal pattern with single UE support and good steadiness      Knee/Hip Exercises: Stretches   Passive Hamstring Stretch Right;3 reps;30 seconds    Passive Hamstring Stretch Limitations 12" step  Knee: Self-Stretch to increase Flexion Right;10 seconds    Knee: Self-Stretch Limitations 10 reps on 12" step    Gastroc Stretch Both;3 reps;30 seconds      Knee/Hip Exercises: Standing   Forward Step Up Right;15 reps;Hand Hold: 1;Step Height: 6"    Other Standing Knee Exercises tandem stance, 30 sec, each LE back      Knee/Hip Exercises: Seated   Sit to Sand 2 sets;10 reps;without UE support      Knee/Hip Exercises: Supine   Quad Sets Right;15 reps    Quad Sets Limitations 5 sec hold    Straight Leg Raises Right;15 reps    Knee Extension Right;AROM    Knee Extension Limitations 5    Knee Flexion Right;AROM    Knee Flexion Limitations 112    Other Supine Knee/Hip Exercises heel prop with towel 2 min                   PT Education - 08/14/19 0958    Education Details Exercise technique, continue HEP    Person(s) Educated Patient    Methods Explanation;Demonstration    Comprehension Verbalized understanding;Returned demonstration            PT Short Term Goals - 08/14/19 0959      PT SHORT TERM GOAL #1   Title Pt will perform HEP correctly and consistently to improve ROM, strength, ambulation tolerance and standing tolerance.    Time 3    Period Weeks    Status On-going    Target Date 09/01/19      PT SHORT TERM GOAL #2   Title Pt will demonstrate R knee AROM 0-100 deg to improve ability to perform functional  activities around the home.    Time 3    Period Weeks    Status On-going             PT Long Term Goals - 08/14/19 1000      PT LONG TERM GOAL #1   Title Pt will demonstrate R knee AROM 0-120 deg to improve ability to perform functional activities around the home.    Time 6    Period Weeks    Status On-going      PT LONG TERM GOAL #2   Title Pt will improve R knee strength to equal L knee strength to improve ability to perform transfers and ambulation without compensations.    Time 6    Period Weeks    Status On-going      PT LONG TERM GOAL #3   Title Pt will ambulate at 1.0 m/s without AD to allow pt to begin walking program for personal exercise program.    Time 6    Period Weeks    Status On-going                 Plan - 08/14/19 1032    Clinical Impression Statement Began with standing stretches to improve knee ROM with good static holds. Continued R LE strengthening with intermittent cues for form. Pt tolerates increased reps and sets with pain while performing that subsides with rest. Pt with slight weight-shift to LLE with STS reps, able to correct minimally with verbal cues to increase RLE weight-bearing. Performed steps with good reciprocal pattern, requiring single UE support for steadying. Pt performs tandem stance with greater difficulty with RLE back, requiring assist on parallel bars after 10 sec and mild unsteadiness. Continue to progress as able.    Examination-Activity Limitations Locomotion Level;Sleep;Stand  Examination-Participation Restrictions Cleaning;Driving;Laundry    Stability/Clinical Decision Making Stable/Uncomplicated    Rehab Potential Good    PT Frequency 3x / week    PT Duration 6 weeks    PT Treatment/Interventions ADLs/Self Care Home Management;Aquatic Therapy;Biofeedback;Cryotherapy;Electrical Stimulation;Moist Heat;DME Instruction;Gait training;Stair training;Functional mobility training;Therapeutic activities;Therapeutic  exercise;Balance training;Neuromuscular re-education;Patient/family education;Manual techniques;Scar mobilization;Passive range of motion;Dry needling;Taping;Joint Manipulations    PT Next Visit Plan Begin R quad strengthening, focus on gaining ROM, stretching. Use manual for pain control and to gain ROM as needed.    PT Home Exercise Plan Eval: quad set, SLR, heel slides; 7/22: heel prop    Consulted and Agree with Plan of Care Patient           Patient will benefit from skilled therapeutic intervention in order to improve the following deficits and impairments:  Abnormal gait, Decreased activity tolerance, Decreased balance, Decreased range of motion, Decreased strength, Hypomobility, Increased edema, Impaired perceived functional ability, Impaired sensation, Pain  Visit Diagnosis: Acute pain of right knee  Stiffness of right knee, not elsewhere classified  Other abnormalities of gait and mobility     Problem List Patient Active Problem List   Diagnosis Date Noted  . Primary localized osteoarthritis of left knee 07/22/2019  . Primary osteoarthritis of right knee   . Constipation 09/05/2018  . Abdominal pain, chronic, epigastric 05/27/2018  . Dysphagia 05/27/2018  . Depression, major, single episode, moderate ([place]) 03/22/2018  . S/P complete hysterectomy 06/06/2017  . Derangement of posterior horn of medial meniscus of right knee   . Right knee pain 09/10/2015  . Iron deficiency anemia 10/16/2013  . Fibroids 08/21/2013  . Excessive or frequent menstruation 06/19/2013  . Morbid obesity ([place]) 07/12/2012  . Obstructive sleep apnea 06/21/2012  . Chest pain   . Hyperlipidemia   . Type 2 diabetes mellitus ([place])   . Obesity   . Laboratory test   . Nephrolithiasis   . Asthma   . Endometriosis   . Hypertension   . Iron deficiency anemia 05/11/2009  . GERD 05/11/2009     Talbot Grumbling PT, DPT 08/14/19, 10:33 AM [place] [address] [place], [place], [address] Phone: [phone]   Fax:  [phone]  Name: EMERY BINZ MRN: [id number] Date of Birth: 10/13/1960

## 2019-08-19 ENCOUNTER — Encounter (HOSPITAL_COMMUNITY): Payer: Self-pay | Admitting: Physical Therapy

## 2019-08-19 ENCOUNTER — Ambulatory Visit (HOSPITAL_COMMUNITY): Payer: Medicare Other | Admitting: Physical Therapy

## 2019-08-19 ENCOUNTER — Other Ambulatory Visit: Payer: Self-pay

## 2019-08-19 DIAGNOSIS — M25561 Pain in right knee: Secondary | ICD-10-CM | POA: Diagnosis not present

## 2019-08-19 DIAGNOSIS — R2689 Other abnormalities of gait and mobility: Secondary | ICD-10-CM

## 2019-08-19 DIAGNOSIS — M25661 Stiffness of right knee, not elsewhere classified: Secondary | ICD-10-CM

## 2019-08-19 NOTE — Therapy (Signed)
[place], [place], [address] Phone: [phone]   Fax:  [phone]  Physical Therapy Treatment  Patient Details  Name: Carrie Cook MRN: [id number] Date of Birth: 1960/03/14 Referring Provider (PT): Arther Abbott, MD   Encounter Date: 08/19/2019 RIGHT KNEE ROM: 5-121 degrees AMBULATION DISTANCE: 150 feet     PT End of Session - 08/19/19 1256    Visit Number 4    Number of Visits 18    Date for PT Re-Evaluation 09/26/19    Authorization Type Medicare Part A and B    Authorization Time Period 08/11/19 to 09/26/19    Progress Note Due on Visit 10    PT Start Time 1300    PT Stop Time 1340    PT Time Calculation (min) 40 min    Activity Tolerance Patient tolerated treatment well    Behavior During Therapy [facility] for tasks assessed/performed           Past Medical History:  Diagnosis Date  . Anemia    PT HAS HAD COLONOSCOPY AND ENDOSCOPY WORK UP - NO PROBLEMS FOUND AS SOURCE OF ANEMIA -- PT HAD IRON INFUSION AT [facility] 11/13/12-AFTER SEEING HEMATOLOGIST DR. Garrison AND HE GAVE HEMATOLOGIC CLEARANCE FOR GASTRIC BYPASS SURGERY.  Marland Kitchen Anxiety   . Asthma    daily and prn inhalers  . Degenerative joint disease    right knee, spine - STATES INTERMITTENT  NUMBNESS DOWN RT LEG WITH PROLONGED STANDING OR WALKING- THINKS RELATED TO HER SPINE PROBLEMS  . Dental crowns present   . Depression   . Family history of anesthesia complication    pt's mother and sister have hx. of post-op N/V  . Fibroids    UTERINE  . Gastroesophageal reflux disease    none for 2 years since Bariatric surgery.  . H/O hiatal hernia   . History of endometriosis   . History of kidney stones   . Hyperlipidemia   . Hypertension    under control with med., has been on med. x 2 yr.  . Insulin dependent diabetes mellitus   . Lock jaw    jaw locks open if opens mouth wide  . Migraines   . Neuropathy    FEET  . Obesity   .  Palpitations   . PONV (postoperative nausea and vomiting)   . Shortness of breath    with daily activities  . Sleep apnea    post-op didn't need CPAP but now states needs it again but not using    Past Surgical History:  Procedure Laterality Date  . ABDOMINAL HYSTERECTOMY N/A 06/06/2017   Procedure: HYSTERECTOMY ABDOMINAL;  Surgeon: Florian Buff, MD;  [facility];  Service: Gynecology;  Laterality: N/A;  . BREATH TEK H PYLORI N/A 08/13/2012   Procedure: BREATH TEK H PYLORI;  Surgeon: Edward Jolly, MD;  Location: [facility];  Service: General;  Laterality: N/A;  . CARPAL TUNNEL RELEASE  01/03/2012   Procedure: CARPAL TUNNEL RELEASE;  Surgeon: Wynonia Sours, MD;  Location: [place];  Service: Orthopedics;  Laterality: Right;  . carpal tunnel release right hand Right 12/13  . COLONOSCOPY  05/2009   [id number] hemorrhoids otherwise normal colon, rectum and terminal ileum  . COLONOSCOPY WITH ESOPHAGOGASTRODUODENOSCOPY (EGD) N/A 10/28/2012   Procedure: COLONOSCOPY WITH ESOPHAGOGASTRODUODENOSCOPY (EGD);  Surgeon: Daneil Dolin, MD;  [facility];  Service: Endoscopy;  Laterality: N/A;  7:30  . DILITATION & CURRETTAGE/HYSTROSCOPY WITH  NOVASURE ABLATION N/A 07/22/2014   Procedure: DILATATION & CURETTAGE/HYSTEROSCOPY WITH NOVASURE ABLATION; uterine length 6.0 cm; uterine width 3.8 cm; total ablation time 1 minute 15 seconds;  Surgeon: Florian Buff, MD;  [facility];  Service: Gynecology;  Laterality: N/A;  . ESOPHAGOGASTRODUODENOSCOPY  5/013/2011   PJK:[id number] esophagus/multiple polyps removed s/p (hyperplastic). Gastritis without H.pylori.  . ESOPHAGOGASTRODUODENOSCOPY (EGD) WITH PROPOFOL N/A 06/06/2018   with LA Grade A esophagitis s/p dilation.   Marland Kitchen GASTRIC ROUX-EN-Y N/A 11/19/2012   Procedure: LAPAROSCOPIC ROUX-EN-Y GASTRIC;  Surgeon: Edward Jolly, MD;  [facility];  Service: General;  Laterality: N/A;  . GIVENS CAPSULE STUDY N/A  10/28/2012   Procedure: GIVENS CAPSULE STUDY;  Surgeon: Daneil Dolin, MD;  [facility];  Service: Endoscopy;  Laterality: N/A;  . KNEE ARTHROSCOPY WITH MEDIAL MENISECTOMY Right 10/28/2015   Procedure: RIGHT KNEE ARTHROSCOPY WITH MEDIAL MENISECTOMY;  Surgeon: Carole Civil, MD;  [facility];  Service: Orthopedics;  Laterality: Right;  . LAPAROSCOPY     due to endometriosis  . MALONEY DILATION N/A 06/06/2018   Procedure: Venia Minks DILATION;  Surgeon: Daneil Dolin, MD;  [facility];  Service: Endoscopy;  Laterality: N/A;  . PELVIC LAPAROSCOPY  11/04/1999   with fulguration of endometriosis  . SALPINGOOPHORECTOMY Bilateral 06/06/2017   Procedure: SALPINGO OOPHORECTOMY;  Surgeon: Florian Buff, MD;  [facility];  Service: Gynecology;  Laterality: Bilateral;  . TOTAL KNEE ARTHROPLASTY Right 07/22/2019   Procedure: TOTAL KNEE ARTHROPLASTY;  Surgeon: Carole Civil, MD;  [facility];  Service: Orthopedics;  Laterality: Right;  . TRIGGER FINGER RELEASE Right 09/24/2012   Procedure: RELEASE A-1 PULLEY OF RIGHT THUMB;  Surgeon: Wynonia Sours, MD;  Location: [place];  Service: Orthopedics;  Laterality: Right;  . TUBAL LIGATION  1993    There were no vitals filed for this visit.   Subjective Assessment - 08/19/19 1256    Subjective Patient states her knee has tightness some days. Patient states she feels she can bend her knee more when she stands. Her home exercises are going well.    How long can you sit comfortably? 30 minutes    How long can you stand comfortably? 20 minutes    How long can you walk comfortably? no issues    Patient Stated Goals get full ROM and be able to walk for exercise    Currently in Pain? Yes    Pain Score 3     Pain Location Knee    Pain Orientation Right    Pain Onset 1 to 4 weeks ago                             [facility] Adult PT Treatment/Exercise - 08/19/19 0001      Knee/Hip  Exercises: Stretches   Passive Hamstring Stretch Right;3 reps;30 seconds    Passive Hamstring Stretch Limitations 12" step    Gastroc Stretch Both;3 reps;30 seconds      Knee/Hip Exercises: Aerobic   Recumbent Bike 5 minutes rocking for dynamic warm up and ROM      Knee/Hip Exercises: Standing   Heel Raises Both;15 reps    Heel Raises Limitations heel and toe raises 15x each    Terminal Knee Extension 1 set;10 reps    Terminal Knee Extension Limitations 10 second holds, purple band    Lateral Step Up 1 set;Right;10 reps;Hand Hold: 2;Step Height: 4"  Forward Step Up Right;15 reps;Hand Hold: 1;Step Height: 6";2 sets      Knee/Hip Exercises: Supine   Quad Sets Right;15 reps    Quad Sets Limitations 5 second holds    Straight Leg Raises Right;15 reps    Straight Leg Raises Limitations with quad set    Knee Extension Right;AROM    Knee Extension Limitations lacking 4    Knee Flexion Right;AROM    Knee Flexion Limitations 121                  PT Education - 08/19/19 1256    Education Details Exercise technique, continue HEP    Person(s) Educated Patient    Methods Explanation;Demonstration    Comprehension Verbalized understanding;Returned demonstration            PT Short Term Goals - 08/14/19 0959      PT SHORT TERM GOAL #1   Title Pt will perform HEP correctly and consistently to improve ROM, strength, ambulation tolerance and standing tolerance.    Time 3    Period Weeks    Status On-going    Target Date 09/01/19      PT SHORT TERM GOAL #2   Title Pt will demonstrate R knee AROM 0-100 deg to improve ability to perform functional activities around the home.    Time 3    Period Weeks    Status On-going             PT Long Term Goals - 08/14/19 1000      PT LONG TERM GOAL #1   Title Pt will demonstrate R knee AROM 0-120 deg to improve ability to perform functional activities around the home.    Time 6    Period Weeks    Status On-going      PT  LONG TERM GOAL #2   Title Pt will improve R knee strength to equal L knee strength to improve ability to perform transfers and ambulation without compensations.    Time 6    Period Weeks    Status On-going      PT LONG TERM GOAL #3   Title Pt will ambulate at 1.0 m/s without AD to allow pt to begin walking program for personal exercise program.    Time 6    Period Weeks    Status On-going                 Plan - 08/19/19 1257    Clinical Impression Statement Patient demonstrates improving knee flexion ROM today to [facility] but continues to lack TKE. Patient able to complete SLR with minimal quad lag but has c/o pain while completing. Patient ambulating well without SPC with minimally antalgic gait and R knee flexed in stance phase. Patient requires frequent cueing for sequencing of step up exercise due to tendency to use LLE. She initially has difficultly completing requiring unilateral UE support but is able to complete without UE support. Patient demonstrates good eccentric control with lateral step downs but requires bilateral UE support and she fatigues quickly.  Patient demonstrates good endurance with TKE exercise but continues to lack TKE ROM. Patient educated on proper gait mechanics at end of session as well as heel prop for promoting extension ROM. Patient will continue to benefit from skilled physical therapy in order to reduce impairment and improve function.    Examination-Activity Limitations Locomotion Level;Sleep;Stand    Examination-Participation Restrictions Cleaning;Driving;Laundry    Stability/Clinical Decision Making Stable/Uncomplicated    Rehab Potential  Good    PT Frequency 3x / week    PT Duration 6 weeks    PT Treatment/Interventions ADLs/Self Care Home Management;Aquatic Therapy;Biofeedback;Cryotherapy;Electrical Stimulation;Moist Heat;DME Instruction;Gait training;Stair training;Functional mobility training;Therapeutic activities;Therapeutic exercise;Balance  training;Neuromuscular re-education;Patient/family education;Manual techniques;Scar mobilization;Passive range of motion;Dry needling;Taping;Joint Manipulations    PT Next Visit Plan Begin R quad strengthening, focus on gaining ROM, stretching. Use manual for pain control and to gain ROM as needed.    PT Home Exercise Plan Eval: quad set, SLR, heel slides; 7/22: heel prop    Consulted and Agree with Plan of Care Patient           Patient will benefit from skilled therapeutic intervention in order to improve the following deficits and impairments:  Abnormal gait, Decreased activity tolerance, Decreased balance, Decreased range of motion, Decreased strength, Hypomobility, Increased edema, Impaired perceived functional ability, Impaired sensation, Pain  Visit Diagnosis: Acute pain of right knee  Stiffness of right knee, not elsewhere classified  Other abnormalities of gait and mobility     Problem List Patient Active Problem List   Diagnosis Date Noted  . Primary localized osteoarthritis of left knee 07/22/2019  . Primary osteoarthritis of right knee   . Constipation 09/05/2018  . Abdominal pain, chronic, epigastric 05/27/2018  . Dysphagia 05/27/2018  . Depression, major, single episode, moderate ([place]) 03/22/2018  . S/P complete hysterectomy 06/06/2017  . Derangement of posterior horn of medial meniscus of right knee   . Right knee pain 09/10/2015  . Iron deficiency anemia 10/16/2013  . Fibroids 08/21/2013  . Excessive or frequent menstruation 06/19/2013  . Morbid obesity ([place]) 07/12/2012  . Obstructive sleep apnea 06/21/2012  . Chest pain   . Hyperlipidemia   . Type 2 diabetes mellitus ([place])   . Obesity   . Laboratory test   . Nephrolithiasis   . Asthma   . Endometriosis   . Hypertension   . Iron deficiency anemia 05/11/2009  . GERD 05/11/2009    1:43 PM, 08/19/19 Mearl Latin PT, DPT Physical Therapist at [place], [place], [address] Phone: [phone]   Fax:  [phone]  Name: Carrie Cook MRN: [id number] Date of Birth: Apr 09, 1960

## 2019-08-21 ENCOUNTER — Ambulatory Visit (HOSPITAL_COMMUNITY): Payer: Medicare Other | Admitting: Physical Therapy

## 2019-08-21 ENCOUNTER — Encounter (HOSPITAL_COMMUNITY): Payer: Self-pay | Admitting: Physical Therapy

## 2019-08-21 ENCOUNTER — Other Ambulatory Visit: Payer: Self-pay

## 2019-08-21 DIAGNOSIS — R2689 Other abnormalities of gait and mobility: Secondary | ICD-10-CM

## 2019-08-21 DIAGNOSIS — M25561 Pain in right knee: Secondary | ICD-10-CM

## 2019-08-21 DIAGNOSIS — M25661 Stiffness of right knee, not elsewhere classified: Secondary | ICD-10-CM

## 2019-08-21 NOTE — Therapy (Signed)
[place], [place], [address] Phone: [phone]   Fax:  [phone]  Physical Therapy Treatment  Patient Details  Name: Carrie Cook MRN: [id number] Date of Birth: 12/03/1960 Referring Provider (PT): Arther Abbott, MD   Encounter Date: 08/21/2019   PT End of Session - 08/21/19 0946    Visit Number 5    Number of Visits 18    Date for PT Re-Evaluation 09/26/19    Authorization Type Medicare Part A and B    Authorization Time Period 08/11/19 to 09/26/19    Progress Note Due on Visit 10    PT Start Time 0946    PT Stop Time 1026    PT Time Calculation (min) 40 min    Activity Tolerance Patient tolerated treatment well    Behavior During Therapy [facility] for tasks assessed/performed           Past Medical History:  Diagnosis Date  . Anemia    PT HAS HAD COLONOSCOPY AND ENDOSCOPY WORK UP - NO PROBLEMS FOUND AS SOURCE OF ANEMIA -- PT HAD IRON INFUSION AT [facility] 11/13/12-AFTER SEEING HEMATOLOGIST DR. Marlton AND HE GAVE HEMATOLOGIC CLEARANCE FOR GASTRIC BYPASS SURGERY.  Marland Kitchen Anxiety   . Asthma    daily and prn inhalers  . Degenerative joint disease    right knee, spine - STATES INTERMITTENT  NUMBNESS DOWN RT LEG WITH PROLONGED STANDING OR WALKING- THINKS RELATED TO HER SPINE PROBLEMS  . Dental crowns present   . Depression   . Family history of anesthesia complication    pt's mother and sister have hx. of post-op N/V  . Fibroids    UTERINE  . Gastroesophageal reflux disease    none for 2 years since Bariatric surgery.  . H/O hiatal hernia   . History of endometriosis   . History of kidney stones   . Hyperlipidemia   . Hypertension    under control with med., has been on med. x 2 yr.  . Insulin dependent diabetes mellitus   . Lock jaw    jaw locks open if opens mouth wide  . Migraines   . Neuropathy    FEET  . Obesity   . Palpitations   . PONV (postoperative nausea and vomiting)   . Shortness of  breath    with daily activities  . Sleep apnea    post-op didn't need CPAP but now states needs it again but not using    Past Surgical History:  Procedure Laterality Date  . ABDOMINAL HYSTERECTOMY N/A 06/06/2017   Procedure: HYSTERECTOMY ABDOMINAL;  Surgeon: Florian Buff, MD;  [facility];  Service: Gynecology;  Laterality: N/A;  . BREATH TEK H PYLORI N/A 08/13/2012   Procedure: BREATH TEK H PYLORI;  Surgeon: Edward Jolly, MD;  Location: [facility];  Service: General;  Laterality: N/A;  . CARPAL TUNNEL RELEASE  01/03/2012   Procedure: CARPAL TUNNEL RELEASE;  Surgeon: Wynonia Sours, MD;  Location: [facility];  Service: Orthopedics;  Laterality: Right;  . carpal tunnel release right hand Right 12/13  . COLONOSCOPY  05/2009   [id number] hemorrhoids otherwise normal colon, rectum and terminal ileum  . COLONOSCOPY WITH ESOPHAGOGASTRODUODENOSCOPY (EGD) N/A 10/28/2012   Procedure: COLONOSCOPY WITH ESOPHAGOGASTRODUODENOSCOPY (EGD);  Surgeon: Daneil Dolin, MD;  [facility];  Service: Endoscopy;  Laterality: N/A;  7:30  . DILITATION & CURRETTAGE/HYSTROSCOPY WITH NOVASURE ABLATION N/A 07/22/2014   Procedure: DILATATION & CURETTAGE/HYSTEROSCOPY WITH  NOVASURE ABLATION; uterine length 6.0 cm; uterine width 3.8 cm; total ablation time 1 minute 15 seconds;  Surgeon: Florian Buff, MD;  [facility];  Service: Gynecology;  Laterality: N/A;  . ESOPHAGOGASTRODUODENOSCOPY  5/013/2011   ZOX:WRUEAV esophagus/multiple polyps removed s/p (hyperplastic). Gastritis without H.pylori.  . ESOPHAGOGASTRODUODENOSCOPY (EGD) WITH PROPOFOL N/A 06/06/2018   with LA Grade A esophagitis s/p dilation.   Marland Kitchen GASTRIC ROUX-EN-Y N/A 11/19/2012   Procedure: LAPAROSCOPIC ROUX-EN-Y GASTRIC;  Surgeon: Edward Jolly, MD;  [facility];  Service: General;  Laterality: N/A;  . GIVENS CAPSULE STUDY N/A 10/28/2012   Procedure: GIVENS CAPSULE STUDY;  Surgeon: Daneil Dolin, MD;   [facility];  Service: Endoscopy;  Laterality: N/A;  . KNEE ARTHROSCOPY WITH MEDIAL MENISECTOMY Right 10/28/2015   Procedure: RIGHT KNEE ARTHROSCOPY WITH MEDIAL MENISECTOMY;  Surgeon: Carole Civil, MD;  [facility];  Service: Orthopedics;  Laterality: Right;  . LAPAROSCOPY     due to endometriosis  . MALONEY DILATION N/A 06/06/2018   Procedure: Venia Minks DILATION;  Surgeon: Daneil Dolin, MD;  [facility];  Service: Endoscopy;  Laterality: N/A;  . PELVIC LAPAROSCOPY  11/04/1999   with fulguration of endometriosis  . SALPINGOOPHORECTOMY Bilateral 06/06/2017   Procedure: SALPINGO OOPHORECTOMY;  Surgeon: Florian Buff, MD;  [facility];  Service: Gynecology;  Laterality: Bilateral;  . TOTAL KNEE ARTHROPLASTY Right 07/22/2019   Procedure: TOTAL KNEE ARTHROPLASTY;  Surgeon: Carole Civil, MD;  [facility];  Service: Orthopedics;  Laterality: Right;  . TRIGGER FINGER RELEASE Right 09/24/2012   Procedure: RELEASE A-1 PULLEY OF RIGHT THUMB;  Surgeon: Wynonia Sours, MD;  Location: [place];  Service: Orthopedics;  Laterality: Right;  . TUBAL LIGATION  1993    There were no vitals filed for this visit.   Subjective Assessment - 08/21/19 0943    Subjective Patient states her knee was really stiff this morning but she did some stretching which helped. She was swollen after last session but no more than normal.    How long can you sit comfortably? 30 minutes    How long can you stand comfortably? 20 minutes    How long can you walk comfortably? no issues    Patient Stated Goals get full ROM and be able to walk for exercise    Currently in Pain? Yes    Pain Score 4     Pain Location Knee    Pain Orientation Right    Pain Onset 1 to 4 weeks ago                             [facility] Adult PT Treatment/Exercise - 08/21/19 0001      Knee/Hip Exercises: Stretches   Gastroc Stretch Both;3 reps;30 seconds      Knee/Hip  Exercises: Aerobic   Recumbent Bike 5 minutes rocking for dynamic warm up and ROM      Knee/Hip Exercises: Machines for Strengthening   Other Machine leg press 30# 2x10 eccentric control      Knee/Hip Exercises: Standing   Terminal Knee Extension 1 set;10 reps    Terminal Knee Extension Limitations 10 second holds, purple band    Lateral Step Up Right;10 reps;Hand Hold: 2;Step Height: 6";2 sets    Forward Step Up Right;15 reps;Hand Hold: 1;Step Height: 6";2 sets    Other Standing Knee Exercises tandem stance, 30 sec, each LE back; SLS  30 seconds bilateral      Knee/Hip Exercises: Supine   Quad Sets Right;15 reps    Straight Leg Raises Right;15 reps    Straight Leg Raises Limitations with quad set    Knee Extension Right;AROM    Knee Extension Limitations lacking 2    Knee Flexion Right;AROM    Knee Flexion Limitations 120                  PT Education - 08/21/19 0943    Education Details Exercise technique, continue HEP    Person(s) Educated Patient    Methods Explanation;Demonstration    Comprehension Verbalized understanding;Returned demonstration            PT Short Term Goals - 08/14/19 0959      PT SHORT TERM GOAL #1   Title Pt will perform HEP correctly and consistently to improve ROM, strength, ambulation tolerance and standing tolerance.    Time 3    Period Weeks    Status On-going    Target Date 09/01/19      PT SHORT TERM GOAL #2   Title Pt will demonstrate R knee AROM 0-100 deg to improve ability to perform functional activities around the home.    Time 3    Period Weeks    Status On-going             PT Long Term Goals - 08/14/19 1000      PT LONG TERM GOAL #1   Title Pt will demonstrate R knee AROM 0-120 deg to improve ability to perform functional activities around the home.    Time 6    Period Weeks    Status On-going      PT LONG TERM GOAL #2   Title Pt will improve R knee strength to equal L knee strength to improve ability to  perform transfers and ambulation without compensations.    Time 6    Period Weeks    Status On-going      PT LONG TERM GOAL #3   Title Pt will ambulate at 1.0 m/s without AD to allow pt to begin walking program for personal exercise program.    Time 6    Period Weeks    Status On-going                 Plan - 08/21/19 0947    Clinical Impression Statement Patient demonstrating improving knee extension ROM and has been trying to improve with heel prop at home. Patient showing good quad set with minimal/no quad lag today and minimal knee pain. Patient able to complete step exercises with unilateral UE support with good mechanics. Patient demonstrates good eccentric control with lateral step down from increased height today but fatigues in quads quickly requiring bilateral UE support. Patient demonstrates good static balance with tandem stance and SLS. Patient requires cueing for eccentric control with leg press with good carry over. Patient will continue to benefit from skilled physical therapy in order to reduce impairment and improve function.    Examination-Activity Limitations Locomotion Level;Sleep;Stand    Examination-Participation Restrictions Cleaning;Driving;Laundry    Stability/Clinical Decision Making Stable/Uncomplicated    Rehab Potential Good    PT Frequency 3x / week    PT Duration 6 weeks    PT Treatment/Interventions ADLs/Self Care Home Management;Aquatic Therapy;Biofeedback;Cryotherapy;Electrical Stimulation;Moist Heat;DME Instruction;Gait training;Stair training;Functional mobility training;Therapeutic activities;Therapeutic exercise;Balance training;Neuromuscular re-education;Patient/family education;Manual techniques;Scar mobilization;Passive range of motion;Dry needling;Taping;Joint Manipulations    PT Next Visit Plan Begin R quad strengthening,  focus on gaining ROM, stretching. Use manual for pain control and to gain ROM as needed.    PT Home Exercise Plan Eval: quad  set, SLR, heel slides; 7/22: heel prop    Consulted and Agree with Plan of Care Patient           Patient will benefit from skilled therapeutic intervention in order to improve the following deficits and impairments:  Abnormal gait, Decreased activity tolerance, Decreased balance, Decreased range of motion, Decreased strength, Hypomobility, Increased edema, Impaired perceived functional ability, Impaired sensation, Pain  Visit Diagnosis: Acute pain of right knee  Stiffness of right knee, not elsewhere classified  Other abnormalities of gait and mobility     Problem List Patient Active Problem List   Diagnosis Date Noted  . Primary localized osteoarthritis of left knee 07/22/2019  . Primary osteoarthritis of right knee   . Constipation 09/05/2018  . Abdominal pain, chronic, epigastric 05/27/2018  . Dysphagia 05/27/2018  . Depression, major, single episode, moderate ([place]) 03/22/2018  . S/P complete hysterectomy 06/06/2017  . Derangement of posterior horn of medial meniscus of right knee   . Right knee pain 09/10/2015  . Iron deficiency anemia 10/16/2013  . Fibroids 08/21/2013  . Excessive or frequent menstruation 06/19/2013  . Morbid obesity ([place]) 07/12/2012  . Obstructive sleep apnea 06/21/2012  . Chest pain   . Hyperlipidemia   . Type 2 diabetes mellitus ([place])   . Obesity   . Laboratory test   . Nephrolithiasis   . Asthma   . Endometriosis   . Hypertension   . Iron deficiency anemia 05/11/2009  . GERD 05/11/2009    10:27 AM, 08/21/19 Mearl Latin PT, DPT Physical Therapist at [place], [place], [address] Phone: [phone]   Fax:  [phone]  Name: Carrie Cook MRN: [id number] Date of Birth: Feb 17, 1960

## 2019-08-25 ENCOUNTER — Ambulatory Visit (HOSPITAL_COMMUNITY): Payer: Medicare Other | Attending: Orthopedic Surgery | Admitting: Physical Therapy

## 2019-08-25 ENCOUNTER — Encounter (HOSPITAL_COMMUNITY): Payer: Self-pay | Admitting: Physical Therapy

## 2019-08-25 ENCOUNTER — Other Ambulatory Visit: Payer: Self-pay

## 2019-08-25 DIAGNOSIS — M25661 Stiffness of right knee, not elsewhere classified: Secondary | ICD-10-CM | POA: Diagnosis not present

## 2019-08-25 DIAGNOSIS — M25561 Pain in right knee: Secondary | ICD-10-CM | POA: Insufficient documentation

## 2019-08-25 DIAGNOSIS — Z471 Aftercare following joint replacement surgery: Secondary | ICD-10-CM | POA: Diagnosis not present

## 2019-08-25 DIAGNOSIS — R2689 Other abnormalities of gait and mobility: Secondary | ICD-10-CM | POA: Diagnosis not present

## 2019-08-26 NOTE — Therapy (Signed)
[facility] [address] [place], [place], [address] Phone: [phone]   Fax:  [phone] Knee Extension Limitations 1  Knee Flexion Right;AROM  Knee Flexion Limitations 121   Physical Therapy Treatment  Patient Details  Name: Carrie Cook MRN: [id number] Date of Birth: 1960-03-17 Referring Provider (PT): Arther Abbott, MD   Encounter Date: 08/25/2019   PT End of Session - 08/25/19 1740    Visit Number 6    Number of Visits 18    Date for PT Re-Evaluation 09/26/19    Authorization Type Medicare Part A and B    Authorization Time Period 08/11/19 to 09/26/19    Progress Note Due on Visit 10    PT Start Time 1735    PT Stop Time 1815    PT Time Calculation (min) 40 min    Activity Tolerance Patient tolerated treatment well    Behavior During Therapy [facility] for tasks assessed/performed           Past Medical History:  Diagnosis Date  . Anemia    PT HAS HAD COLONOSCOPY AND ENDOSCOPY WORK UP - NO PROBLEMS FOUND AS SOURCE OF ANEMIA -- PT HAD IRON INFUSION AT [facility] 11/13/12-AFTER SEEING HEMATOLOGIST DR. [place] AND HE GAVE HEMATOLOGIC CLEARANCE FOR GASTRIC BYPASS SURGERY.  Marland Kitchen Anxiety   . Asthma    daily and prn inhalers  . Degenerative joint disease    right knee, spine - STATES INTERMITTENT  NUMBNESS DOWN RT LEG WITH PROLONGED STANDING OR WALKING- THINKS RELATED TO HER SPINE PROBLEMS  . Dental crowns present   . Depression   . Family history of anesthesia complication    pt's mother and sister have hx. of post-op N/V  . Fibroids    UTERINE  . Gastroesophageal reflux disease    none for 2 years since Bariatric surgery.  . H/O hiatal hernia   . History of endometriosis   . History of kidney stones   . Hyperlipidemia   . Hypertension    under control with med., has been on med. x 2 yr.  . Insulin dependent diabetes mellitus   . Lock jaw    jaw locks open if opens mouth wide  . Migraines   . Neuropathy    FEET  .  Obesity   . Palpitations   . PONV (postoperative nausea and vomiting)   . Shortness of breath    with daily activities  . Sleep apnea    post-op didn't need CPAP but now states needs it again but not using    Past Surgical History:  Procedure Laterality Date  . ABDOMINAL HYSTERECTOMY N/A 06/06/2017   Procedure: HYSTERECTOMY ABDOMINAL;  Surgeon: Florian Buff, MD;  [facility];  Service: Gynecology;  Laterality: N/A;  . BREATH TEK H PYLORI N/A 08/13/2012   Procedure: BREATH TEK H PYLORI;  Surgeon: Edward Jolly, MD;  Location: [facility];  Service: General;  Laterality: N/A;  . CARPAL TUNNEL RELEASE  01/03/2012   Procedure: CARPAL TUNNEL RELEASE;  Surgeon: Wynonia Sours, MD;  Location: [facility];  Service: Orthopedics;  Laterality: Right;  . carpal tunnel release right hand Right 12/13  . COLONOSCOPY  05/2009   FIE:PPIRJJOA hemorrhoids otherwise normal colon, rectum and terminal ileum  . COLONOSCOPY WITH ESOPHAGOGASTRODUODENOSCOPY (EGD) N/A 10/28/2012   Procedure: COLONOSCOPY WITH ESOPHAGOGASTRODUODENOSCOPY (EGD);  Surgeon: Daneil Dolin, MD;  [facility];  Service: Endoscopy;  Laterality: N/A;  7:30  . DILITATION &  CURRETTAGE/HYSTROSCOPY WITH NOVASURE ABLATION N/A 07/22/2014   Procedure: DILATATION & CURETTAGE/HYSTEROSCOPY WITH NOVASURE ABLATION; uterine length 6.0 cm; uterine width 3.8 cm; total ablation time 1 minute 15 seconds;  Surgeon: Florian Buff, MD;  [facility];  Service: Gynecology;  Laterality: N/A;  . ESOPHAGOGASTRODUODENOSCOPY  5/013/2011   KPT:WSFKCL esophagus/multiple polyps removed s/p (hyperplastic). Gastritis without H.pylori.  . ESOPHAGOGASTRODUODENOSCOPY (EGD) WITH PROPOFOL N/A 06/06/2018   with LA Grade A esophagitis s/p dilation.   Marland Kitchen GASTRIC ROUX-EN-Y N/A 11/19/2012   Procedure: LAPAROSCOPIC ROUX-EN-Y GASTRIC;  Surgeon: Edward Jolly, MD;  [facility];  Service: General;  Laterality: N/A;  . GIVENS CAPSULE STUDY  N/A 10/28/2012   Procedure: GIVENS CAPSULE STUDY;  Surgeon: Daneil Dolin, MD;  [facility];  Service: Endoscopy;  Laterality: N/A;  . KNEE ARTHROSCOPY WITH MEDIAL MENISECTOMY Right 10/28/2015   Procedure: RIGHT KNEE ARTHROSCOPY WITH MEDIAL MENISECTOMY;  Surgeon: Carole Civil, MD;  [facility];  Service: Orthopedics;  Laterality: Right;  . LAPAROSCOPY     due to endometriosis  . MALONEY DILATION N/A 06/06/2018   Procedure: Venia Minks DILATION;  Surgeon: Daneil Dolin, MD;  [facility];  Service: Endoscopy;  Laterality: N/A;  . PELVIC LAPAROSCOPY  11/04/1999   with fulguration of endometriosis  . SALPINGOOPHORECTOMY Bilateral 06/06/2017   Procedure: SALPINGO OOPHORECTOMY;  Surgeon: Florian Buff, MD;  [facility];  Service: Gynecology;  Laterality: Bilateral;  . TOTAL KNEE ARTHROPLASTY Right 07/22/2019   Procedure: TOTAL KNEE ARTHROPLASTY;  Surgeon: Carole Civil, MD;  [facility];  Service: Orthopedics;  Laterality: Right;  . TRIGGER FINGER RELEASE Right 09/24/2012   Procedure: RELEASE A-1 PULLEY OF RIGHT THUMB;  Surgeon: Wynonia Sours, MD;  Location: [facility];  Service: Orthopedics;  Laterality: Right;  . TUBAL LIGATION  1993    There were no vitals filed for this visit.   Subjective Assessment - 08/25/19 1740    Subjective Patient says she had a bad day with pain yesterday. Says she had to do some things that required her to get around quick, and had a misstep. Leg was throbbing last night, feels better now.    How long can you sit comfortably? 30 minutes    How long can you stand comfortably? 20 minutes    How long can you walk comfortably? no issues    Patient Stated Goals get full ROM and be able to walk for exercise    Currently in Pain? Yes    Pain Score 3     Pain Location Knee    Pain Orientation Right    Pain Descriptors / Indicators Sore    Pain Type Surgical pain    Pain Onset 1 to 4 weeks ago    Pain Frequency  Constant                  08/25/19 0001  Knee/Hip Exercises: Stretches  Gastroc Stretch Both;3 reps;30 seconds  Gastroc Stretch Limitations slant board   Knee/Hip Exercises: Aerobic  Recumbent Bike 4 min dynamic warmup seat 7 lv 2   Knee/Hip Exercises: Machines for Ambulance person TKE 30# 2x 10   Cybex Leg Press leg press 30# 2x10 eccentric control  Knee/Hip Exercises: Standing  Heel Raises Both;20 reps  Lateral Step Up Right;1 set;15 reps;Hand Hold: 0;Step Height: 6"  Forward Step Up Right;1 set;15 reps;Hand Hold: 0;Step Height: 6"  Hip Abduction Both;1 set;15 reps  Knee Flexion  Right;1 set;15 reps  SLS 3 x 20" solid floor   Knee/Hip Exercises: Supine  Knee Extension Right;AROM  Knee Extension Limitations 1  Knee Flexion Right;AROM  Knee Flexion Limitations 121          PT Short Term Goals - 08/14/19 0959      PT SHORT TERM GOAL #1   Title Pt will perform HEP correctly and consistently to improve ROM, strength, ambulation tolerance and standing tolerance.    Time 3    Period Weeks    Status On-going    Target Date 09/01/19      PT SHORT TERM GOAL #2   Title Pt will demonstrate R knee AROM 0-100 deg to improve ability to perform functional activities around the home.    Time 3    Period Weeks    Status On-going             PT Long Term Goals - 08/14/19 1000      PT LONG TERM GOAL #1   Title Pt will demonstrate R knee AROM 0-120 deg to improve ability to perform functional activities around the home.    Time 6    Period Weeks    Status On-going      PT LONG TERM GOAL #2   Title Pt will improve R knee strength to equal L knee strength to improve ability to perform transfers and ambulation without compensations.    Time 6    Period Weeks    Status On-going      PT LONG TERM GOAL #3   Title Pt will ambulate at 1.0 m/s without AD to allow pt to begin walking program for personal exercise program.    Time 6    Period Weeks     Status On-going                 Plan - 08/25/19 5366    Clinical Impression Statement Patient demos good progress toward therapy goals. Patient continues to be limited by eccentric weakness with lowering from steps and by increased pain with functional activity. Patient shows good AROM. Patient requires verbal cues for eccentric control during leg pressing. Patient will continue to benefit from skilled therapy services to progress knee strength and mobility to reduce pain and improve LOF with functional mobility.    Examination-Activity Limitations Locomotion Level;Sleep;Stand    Examination-Participation Restrictions Cleaning;Driving;Laundry    Stability/Clinical Decision Making Stable/Uncomplicated    Rehab Potential Good    PT Frequency 3x / week    PT Duration 6 weeks    PT Treatment/Interventions ADLs/Self Care Home Management;Aquatic Therapy;Biofeedback;Cryotherapy;Electrical Stimulation;Moist Heat;DME Instruction;Gait training;Stair training;Functional mobility training;Therapeutic activities;Therapeutic exercise;Balance training;Neuromuscular re-education;Patient/family education;Manual techniques;Scar mobilization;Passive range of motion;Dry needling;Taping;Joint Manipulations    PT Next Visit Plan Continue to progress functional knee strength with focus on eccentric control. Use manual for pain control and to gain ROM as needed.    PT Home Exercise Plan Eval: quad set, SLR, heel slides; 7/22: heel prop    Consulted and Agree with Plan of Care Patient           Patient will benefit from skilled therapeutic intervention in order to improve the following deficits and impairments:  Abnormal gait, Decreased activity tolerance, Decreased balance, Decreased range of motion, Decreased strength, Hypomobility, Increased edema, Impaired perceived functional ability, Impaired sensation, Pain  Visit Diagnosis: Acute pain of right knee  Stiffness of right knee, not elsewhere  classified  Other abnormalities of gait and mobility  Problem List Patient Active Problem List   Diagnosis Date Noted  . Primary localized osteoarthritis of left knee 07/22/2019  . Primary osteoarthritis of right knee   . Constipation 09/05/2018  . Abdominal pain, chronic, epigastric 05/27/2018  . Dysphagia 05/27/2018  . Depression, major, single episode, moderate ([place]) 03/22/2018  . S/P complete hysterectomy 06/06/2017  . Derangement of posterior horn of medial meniscus of right knee   . Right knee pain 09/10/2015  . Iron deficiency anemia 10/16/2013  . Fibroids 08/21/2013  . Excessive or frequent menstruation 06/19/2013  . Morbid obesity ([place]) 07/12/2012  . Obstructive sleep apnea 06/21/2012  . Chest pain   . Hyperlipidemia   . Type 2 diabetes mellitus ([place])   . Obesity   . Laboratory test   . Nephrolithiasis   . Asthma   . Endometriosis   . Hypertension   . Iron deficiency anemia 05/11/2009  . GERD 05/11/2009    8:45 AM, 08/26/19 Josue Hector PT DPT  Physical Therapist with [facility]  [phone] [place] [address] [place], [place], [address] Phone: [phone]   Fax:  [phone]  Name: ZALEIGH BERMINGHAM MRN: [id number] Date of Birth: 1960-06-08

## 2019-08-27 ENCOUNTER — Other Ambulatory Visit: Payer: Self-pay

## 2019-08-27 ENCOUNTER — Ambulatory Visit (HOSPITAL_COMMUNITY): Payer: Medicare Other

## 2019-08-27 ENCOUNTER — Encounter (HOSPITAL_COMMUNITY): Payer: Self-pay

## 2019-08-27 DIAGNOSIS — M25661 Stiffness of right knee, not elsewhere classified: Secondary | ICD-10-CM

## 2019-08-27 DIAGNOSIS — M25561 Pain in right knee: Secondary | ICD-10-CM

## 2019-08-27 DIAGNOSIS — R2689 Other abnormalities of gait and mobility: Secondary | ICD-10-CM

## 2019-08-27 NOTE — Therapy (Signed)
[facility] [place], [place], [address] Phone: [phone]   Fax:  [phone]  Physical Therapy Treatment  Patient Details  Name: Carrie Cook MRN: [id number] Date of Birth: [birth year] Referring Provider (PT): Arther Abbott, MD   Encounter Date: 08/27/2019   PT End of Session - 08/27/19 1457    Visit Number 7    Number of Visits 18    Date for PT Re-Evaluation 09/26/19    Authorization Type Medicare Part A and B    Authorization Time Period 08/11/19 to 09/26/19    Progress Note Due on Visit 10    PT Start Time 1450    PT Stop Time 1533    PT Time Calculation (min) 43 min    Activity Tolerance Patient tolerated treatment well    Behavior During Therapy [facility] for tasks assessed/performed           Past Medical History:  Diagnosis Date  . Anemia    PT HAS HAD COLONOSCOPY AND ENDOSCOPY WORK UP - NO PROBLEMS FOUND AS SOURCE OF ANEMIA -- PT HAD IRON INFUSION AT [facility] 11/13/12-AFTER SEEING HEMATOLOGIST DR. [place] AND HE GAVE HEMATOLOGIC CLEARANCE FOR GASTRIC BYPASS SURGERY.  Marland Kitchen Anxiety   . Asthma    daily and prn inhalers  . Degenerative joint disease    right knee, spine - STATES INTERMITTENT  NUMBNESS DOWN RT LEG WITH PROLONGED STANDING OR WALKING- THINKS RELATED TO HER SPINE PROBLEMS  . Dental crowns present   . Depression   . Family history of anesthesia complication    pt's mother and sister have hx. of post-op N/V  . Fibroids    UTERINE  . Gastroesophageal reflux disease    none for 2 years since Bariatric surgery.  . H/O hiatal hernia   . History of endometriosis   . History of kidney stones   . Hyperlipidemia   . Hypertension    under control with med., has been on med. x 2 yr.  . Insulin dependent diabetes mellitus   . Lock jaw    jaw locks open if opens mouth wide  . Migraines   . Neuropathy    FEET  . Obesity   . Palpitations   . PONV (postoperative nausea and vomiting)   . Shortness of  breath    with daily activities  . Sleep apnea    post-op didn't need CPAP but now states needs it again but not using    Past Surgical History:  Procedure Laterality Date  . ABDOMINAL HYSTERECTOMY N/A 06/06/2017   Procedure: HYSTERECTOMY ABDOMINAL;  Surgeon: Florian Buff, MD;  [facility];  Service: Gynecology;  Laterality: N/A;  . BREATH TEK H PYLORI N/A 08/13/2012   Procedure: BREATH TEK H PYLORI;  Surgeon: Edward Jolly, MD;  Location: [facility];  Service: General;  Laterality: N/A;  . CARPAL TUNNEL RELEASE  01/03/2012   Procedure: CARPAL TUNNEL RELEASE;  Surgeon: Wynonia Sours, MD;  Location: [facility];  Service: Orthopedics;  Laterality: Right;  . carpal tunnel release right hand Right 12/13  . COLONOSCOPY  05/2009   YOV:ZCHYIFOY hemorrhoids otherwise normal colon, rectum and terminal ileum  . COLONOSCOPY WITH ESOPHAGOGASTRODUODENOSCOPY (EGD) N/A 10/28/2012   Procedure: COLONOSCOPY WITH ESOPHAGOGASTRODUODENOSCOPY (EGD);  Surgeon: Daneil Dolin, MD;  [facility];  Service: Endoscopy;  Laterality: N/A;  7:30  . DILITATION & CURRETTAGE/HYSTROSCOPY WITH NOVASURE ABLATION N/A 07/22/2014   Procedure: DILATATION & CURETTAGE/HYSTEROSCOPY WITH  NOVASURE ABLATION; uterine length 6.0 cm; uterine width 3.8 cm; total ablation time 1 minute 15 seconds;  Surgeon: Florian Buff, MD;  [facility];  Service: Gynecology;  Laterality: N/A;  . ESOPHAGOGASTRODUODENOSCOPY  5/013/2011   WUJ:WJXBJY esophagus/multiple polyps removed s/p (hyperplastic). Gastritis without H.pylori.  . ESOPHAGOGASTRODUODENOSCOPY (EGD) WITH PROPOFOL N/A 06/06/2018   with LA Grade A esophagitis s/p dilation.   Marland Kitchen GASTRIC ROUX-EN-Y N/A 11/19/2012   Procedure: LAPAROSCOPIC ROUX-EN-Y GASTRIC;  Surgeon: Edward Jolly, MD;  [facility];  Service: General;  Laterality: N/A;  . GIVENS CAPSULE STUDY N/A 10/28/2012   Procedure: GIVENS CAPSULE STUDY;  Surgeon: Daneil Dolin, MD;   [facility];  Service: Endoscopy;  Laterality: N/A;  . KNEE ARTHROSCOPY WITH MEDIAL MENISECTOMY Right 10/28/2015   Procedure: RIGHT KNEE ARTHROSCOPY WITH MEDIAL MENISECTOMY;  Surgeon: Carole Civil, MD;  [facility];  Service: Orthopedics;  Laterality: Right;  . LAPAROSCOPY     due to endometriosis  . MALONEY DILATION N/A 06/06/2018   Procedure: Venia Minks DILATION;  Surgeon: Daneil Dolin, MD;  [facility];  Service: Endoscopy;  Laterality: N/A;  . PELVIC LAPAROSCOPY  11/04/1999   with fulguration of endometriosis  . SALPINGOOPHORECTOMY Bilateral 06/06/2017   Procedure: SALPINGO OOPHORECTOMY;  Surgeon: Florian Buff, MD;  [facility];  Service: Gynecology;  Laterality: Bilateral;  . TOTAL KNEE ARTHROPLASTY Right 07/22/2019   Procedure: TOTAL KNEE ARTHROPLASTY;  Surgeon: Carole Civil, MD;  [facility];  Service: Orthopedics;  Laterality: Right;  . TRIGGER FINGER RELEASE Right 09/24/2012   Procedure: RELEASE A-1 PULLEY OF RIGHT THUMB;  Surgeon: Wynonia Sours, MD;  Location: [facility];  Service: Orthopedics;  Laterality: Right;  . TUBAL LIGATION  1993    There were no vitals filed for this visit.   Subjective Assessment - 08/27/19 1455    Subjective Pt reports she began to drive earlier this week.  Reports she continues to be limited by pain and swelling present proximal knee.    Patient Stated Goals get full ROM and be able to walk for exercise    Currently in Pain? Yes    Pain Score 2     Pain Location Knee    Pain Orientation Right    Pain Descriptors / Indicators Sore;Tightness    Pain Type Surgical pain    Pain Onset 1 to 4 weeks ago    Pain Frequency Constant                             [facility] Adult PT Treatment/Exercise - 08/27/19 0001      Knee/Hip Exercises: Machines for Strengthening   Cybex Leg Press leg press 30# 2x10 eccentric control      Knee/Hip Exercises: Standing   Lateral Step Up  Right;1 set;15 reps;Hand Hold: 0;Step Height: 6"    Forward Step Up Right;1 set;15 reps;Hand Hold: 0;Step Height: 6"    Step Down Right;10 reps;Hand Hold: 1;Step Height: 6"    Functional Squat 15 reps    Stairs 7in reciprocal pattern 3RT    SLS with Vectors 3x5" intermittent HHA      Manual Therapy   Manual Therapy Myofascial release;Edema management    Manual therapy comments Manual complete separate than rest of tx    Edema Management Decongitive technqiues for edema control wiht LE elevated    Myofascial Release scar tissue massage to address adhesions  PT Short Term Goals - 08/14/19 0959      PT SHORT TERM GOAL #1   Title Pt will perform HEP correctly and consistently to improve ROM, strength, ambulation tolerance and standing tolerance.    Time 3    Period Weeks    Status On-going    Target Date 09/01/19      PT SHORT TERM GOAL #2   Title Pt will demonstrate R knee AROM 0-100 deg to improve ability to perform functional activities around the home.    Time 3    Period Weeks    Status On-going             PT Long Term Goals - 08/14/19 1000      PT LONG TERM GOAL #1   Title Pt will demonstrate R knee AROM 0-120 deg to improve ability to perform functional activities around the home.    Time 6    Period Weeks    Status On-going      PT LONG TERM GOAL #2   Title Pt will improve R knee strength to equal L knee strength to improve ability to perform transfers and ambulation without compensations.    Time 6    Period Weeks    Status On-going      PT LONG TERM GOAL #3   Title Pt will ambulate at 1.0 m/s without AD to allow pt to begin walking program for personal exercise program.    Time 6    Period Weeks    Status On-going                 Plan - 08/27/19 1524    Clinical Impression Statement Began session with manual to address edema presesnt proximal knee, added MFR to address scar tissue adhesion with reports of pain relief  and improved knee mobility following.  Pt progressing well with functional strengthening exercises, continues to exhibit difficulty with eccentric control descending stairs.  No reports of increased pain at EOS.  AROM 1-121 degrees.    Examination-Activity Limitations Locomotion Level;Sleep;Stand    Examination-Participation Restrictions Cleaning;Driving;Laundry    Stability/Clinical Decision Making Stable/Uncomplicated    Clinical Decision Making Low    Rehab Potential Good    PT Frequency 3x / week    PT Duration 6 weeks    PT Treatment/Interventions ADLs/Self Care Home Management;Aquatic Therapy;Biofeedback;Cryotherapy;Electrical Stimulation;Moist Heat;DME Instruction;Gait training;Stair training;Functional mobility training;Therapeutic activities;Therapeutic exercise;Balance training;Neuromuscular re-education;Patient/family education;Manual techniques;Scar mobilization;Passive range of motion;Dry needling;Taping;Joint Manipulations    PT Next Visit Plan Continue to progress functional knee strength with focus on eccentric control. Use manual for pain control and to gain ROM as needed.  Complete progress note prior MD apt next week.    PT Home Exercise Plan Eval: quad set, SLR, heel slides; 7/22: heel prop           Patient will benefit from skilled therapeutic intervention in order to improve the following deficits and impairments:  Abnormal gait, Decreased activity tolerance, Decreased balance, Decreased range of motion, Decreased strength, Hypomobility, Increased edema, Impaired perceived functional ability, Impaired sensation, Pain  Visit Diagnosis: Stiffness of right knee, not elsewhere classified  Other abnormalities of gait and mobility  Acute pain of right knee     Problem List Patient Active Problem List   Diagnosis Date Noted  . Primary localized osteoarthritis of left knee 07/22/2019  . Primary osteoarthritis of right knee   . Constipation 09/05/2018  . Abdominal  pain, chronic, epigastric 05/27/2018  . Dysphagia 05/27/2018  .  Depression, major, single episode, moderate ([place]) 03/22/2018  . S/P complete hysterectomy 06/06/2017  . Derangement of posterior horn of medial meniscus of right knee   . Right knee pain 09/10/2015  . Iron deficiency anemia 10/16/2013  . Fibroids 08/21/2013  . Excessive or frequent menstruation 06/19/2013  . Morbid obesity ([place]) 07/12/2012  . Obstructive sleep apnea 06/21/2012  . Chest pain   . Hyperlipidemia   . Type 2 diabetes mellitus ([place])   . Obesity   . Laboratory test   . Nephrolithiasis   . Asthma   . Endometriosis   . Hypertension   . Iron deficiency anemia 05/11/2009  . GERD 05/11/2009   Ihor Austin, LPTA/CLT; CBIS [phone]  Aldona Lento 08/27/2019, 3:31 PM  [facility] [address] [place], [place], [address] Phone: [phone]   Fax:  [phone]  Name: Carrie Cook MRN: [id number] Date of Birth: Sep 14, 1960

## 2019-08-28 ENCOUNTER — Other Ambulatory Visit: Payer: Self-pay | Admitting: Family Medicine

## 2019-08-29 ENCOUNTER — Encounter (HOSPITAL_COMMUNITY): Payer: Self-pay

## 2019-08-29 ENCOUNTER — Other Ambulatory Visit: Payer: Self-pay

## 2019-08-29 ENCOUNTER — Ambulatory Visit (HOSPITAL_COMMUNITY): Payer: Medicare Other

## 2019-08-29 DIAGNOSIS — R2689 Other abnormalities of gait and mobility: Secondary | ICD-10-CM

## 2019-08-29 DIAGNOSIS — M25661 Stiffness of right knee, not elsewhere classified: Secondary | ICD-10-CM

## 2019-08-29 DIAGNOSIS — M25561 Pain in right knee: Secondary | ICD-10-CM | POA: Diagnosis not present

## 2019-08-29 NOTE — Therapy (Signed)
[place], [place], [address] Phone: [phone]   Fax:  [phone]  Physical Therapy Treatment  Patient Details  Name: Carrie Cook MRN: [id number] Date of Birth: 1960-11-27 Referring Provider (PT): Arther Abbott, MD   Encounter Date: 08/29/2019   PT End of Session - 08/29/19 1723    Visit Number 8    Number of Visits 18    Date for PT Re-Evaluation 09/26/19    Authorization Type Medicare Part A and B    Authorization Time Period 08/11/19 to 09/26/19    Progress Note Due on Visit 10    PT Start Time 1704    PT Stop Time 1742    PT Time Calculation (min) 38 min    Activity Tolerance Patient tolerated treatment well;Patient limited by pain;No increased pain    Behavior During Therapy WFL for tasks assessed/performed           Past Medical History:  Diagnosis Date  . Anemia    PT HAS HAD COLONOSCOPY AND ENDOSCOPY WORK UP - NO PROBLEMS FOUND AS SOURCE OF ANEMIA -- PT HAD IRON INFUSION AT [facility] 11/13/12-AFTER SEEING HEMATOLOGIST DR. Fellows AND HE GAVE HEMATOLOGIC CLEARANCE FOR GASTRIC BYPASS SURGERY.  Marland Kitchen Anxiety   . Asthma    daily and prn inhalers  . Degenerative joint disease    right knee, spine - STATES INTERMITTENT  NUMBNESS DOWN RT LEG WITH PROLONGED STANDING OR WALKING- THINKS RELATED TO HER SPINE PROBLEMS  . Dental crowns present   . Depression   . Family history of anesthesia complication    pt's mother and sister have hx. of post-op N/V  . Fibroids    UTERINE  . Gastroesophageal reflux disease    none for 2 years since Bariatric surgery.  . H/O hiatal hernia   . History of endometriosis   . History of kidney stones   . Hyperlipidemia   . Hypertension    under control with med., has been on med. x 2 yr.  . Insulin dependent diabetes mellitus   . Lock jaw    jaw locks open if opens mouth wide  . Migraines   . Neuropathy    FEET  . Obesity   . Palpitations   . PONV  (postoperative nausea and vomiting)   . Shortness of breath    with daily activities  . Sleep apnea    post-op didn't need CPAP but now states needs it again but not using    Past Surgical History:  Procedure Laterality Date  . ABDOMINAL HYSTERECTOMY N/A 06/06/2017   Procedure: HYSTERECTOMY ABDOMINAL;  Surgeon: Florian Buff, MD;  [facility];  Service: Gynecology;  Laterality: N/A;  . BREATH TEK H PYLORI N/A 08/13/2012   Procedure: BREATH TEK H PYLORI;  Surgeon: Edward Jolly, MD;  Location: [facility];  Service: General;  Laterality: N/A;  . CARPAL TUNNEL RELEASE  01/03/2012   Procedure: CARPAL TUNNEL RELEASE;  Surgeon: Wynonia Sours, MD;  Location: [facility];  Service: Orthopedics;  Laterality: Right;  . carpal tunnel release right hand Right 12/13  . COLONOSCOPY  05/2009   JEH:[id number] hemorrhoids otherwise normal colon, rectum and terminal ileum  . COLONOSCOPY WITH ESOPHAGOGASTRODUODENOSCOPY (EGD) N/A 10/28/2012   Procedure: COLONOSCOPY WITH ESOPHAGOGASTRODUODENOSCOPY (EGD);  Surgeon: Daneil Dolin, MD;  [facility];  Service: Endoscopy;  Laterality: N/A;  7:30  . DILITATION & CURRETTAGE/HYSTROSCOPY WITH NOVASURE ABLATION N/A 07/22/2014  Procedure: DILATATION & CURETTAGE/HYSTEROSCOPY WITH NOVASURE ABLATION; uterine length 6.0 cm; uterine width 3.8 cm; total ablation time 1 minute 15 seconds;  Surgeon: Florian Buff, MD;  [facility];  Service: Gynecology;  Laterality: N/A;  . ESOPHAGOGASTRODUODENOSCOPY  5/013/2011   OAC:ZYSAYT esophagus/multiple polyps removed s/p (hyperplastic). Gastritis without H.pylori.  . ESOPHAGOGASTRODUODENOSCOPY (EGD) WITH PROPOFOL N/A 06/06/2018   with LA Grade A esophagitis s/p dilation.   Marland Kitchen GASTRIC ROUX-EN-Y N/A 11/19/2012   Procedure: LAPAROSCOPIC ROUX-EN-Y GASTRIC;  Surgeon: Edward Jolly, MD;  [facility];  Service: General;  Laterality: N/A;  . GIVENS CAPSULE STUDY N/A 10/28/2012   Procedure: GIVENS  CAPSULE STUDY;  Surgeon: Daneil Dolin, MD;  [facility];  Service: Endoscopy;  Laterality: N/A;  . KNEE ARTHROSCOPY WITH MEDIAL MENISECTOMY Right 10/28/2015   Procedure: RIGHT KNEE ARTHROSCOPY WITH MEDIAL MENISECTOMY;  Surgeon: Carole Civil, MD;  [facility];  Service: Orthopedics;  Laterality: Right;  . LAPAROSCOPY     due to endometriosis  . MALONEY DILATION N/A 06/06/2018   Procedure: Venia Minks DILATION;  Surgeon: Daneil Dolin, MD;  [facility];  Service: Endoscopy;  Laterality: N/A;  . PELVIC LAPAROSCOPY  11/04/1999   with fulguration of endometriosis  . SALPINGOOPHORECTOMY Bilateral 06/06/2017   Procedure: SALPINGO OOPHORECTOMY;  Surgeon: Florian Buff, MD;  [facility];  Service: Gynecology;  Laterality: Bilateral;  . TOTAL KNEE ARTHROPLASTY Right 07/22/2019   Procedure: TOTAL KNEE ARTHROPLASTY;  Surgeon: Carole Civil, MD;  [facility];  Service: Orthopedics;  Laterality: Right;  . TRIGGER FINGER RELEASE Right 09/24/2012   Procedure: RELEASE A-1 PULLEY OF RIGHT THUMB;  Surgeon: Wynonia Sours, MD;  Location: [facility];  Service: Orthopedics;  Laterality: Right;  . TUBAL LIGATION  1993    There were no vitals filed for this visit.   Subjective Assessment - 08/29/19 1722    Subjective Pt reports she has company at home this week, has done a lot of sitting in car taking grandson to foot ball practice and standing.  Increased pain scale 4/10.    Patient Stated Goals get full ROM and be able to walk for exercise    Currently in Pain? Yes    Pain Score 4     Pain Location Knee    Pain Orientation Right    Pain Descriptors / Indicators Sharp;Sore;Aching;Tightness    Pain Type Surgical pain    Pain Onset 1 to 4 weeks ago    Pain Frequency Constant    Aggravating Factors  laying down, prolonged knee extension    Pain Relieving Factors ice, pain medication                             [facility] Adult PT  Treatment/Exercise - 08/29/19 0001      Knee/Hip Exercises: Stretches   Gastroc Stretch Both;3 reps;30 seconds    Gastroc Stretch Limitations slant board       Knee/Hip Exercises: Standing   Heel Raises Both;20 reps    Heel Raises Limitations heel and toe raises 15x each    Lateral Step Up Right;1 set;15 reps;Step Height: 4";Hand Hold: 1    Lateral Step Up Limitations reduced height for pain control    Forward Step Up Right;1 set;15 reps;Hand Hold: 0;Step Height: 6"    Step Down Right;10 reps;2 sets;Hand Hold: 1;Step Height: 4"    Functional Squat 15 reps  Functional Squat Limitations minisquat      Knee/Hip Exercises: Supine   Knee Extension Right;AROM    Knee Extension Limitations 1    Knee Flexion Right;AROM    Knee Flexion Limitations 120      Manual Therapy   Manual Therapy Myofascial release;Edema management    Manual therapy comments Manual complete separate than rest of tx    Edema Management Decongitive technqiues for edema control wiht LE elevated    Myofascial Release scar tissue massage to address adhesions                     PT Short Term Goals - 08/14/19 0959      PT SHORT TERM GOAL #1   Title Pt will perform HEP correctly and consistently to improve ROM, strength, ambulation tolerance and standing tolerance.    Time 3    Period Weeks    Status On-going    Target Date 09/01/19      PT SHORT TERM GOAL #2   Title Pt will demonstrate R knee AROM 0-100 deg to improve ability to perform functional activities around the home.    Time 3    Period Weeks    Status On-going             PT Long Term Goals - 08/14/19 1000      PT LONG TERM GOAL #1   Title Pt will demonstrate R knee AROM 0-120 deg to improve ability to perform functional activities around the home.    Time 6    Period Weeks    Status On-going      PT LONG TERM GOAL #2   Title Pt will improve R knee strength to equal L knee strength to improve ability to perform transfers and  ambulation without compensations.    Time 6    Period Weeks    Status On-going      PT LONG TERM GOAL #3   Title Pt will ambulate at 1.0 m/s without AD to allow pt to begin walking program for personal exercise program.    Time 6    Period Weeks    Status On-going                 Plan - 08/29/19 1724    Clinical Impression Statement Pt arrived wiht reports of increased pain today.  Began session wiht manual to address edema present proximal knee and scar tissue massage to address adhesions on incision.  AROM 1-120.  Reduced strengthening for pain control.  Pt continues to be limited by pain, reports increased mobility following manual and no reports of increased pain with functional strengthening activities.  Pt encouraged to return to AD for weight bearing to assist with pain, verbalized understanding.  EOS pt reports pain scale 2/10.    Examination-Activity Limitations Locomotion Level;Sleep;Stand    Examination-Participation Restrictions Cleaning;Driving;Laundry    Stability/Clinical Decision Making Stable/Uncomplicated    Clinical Decision Making Low    Rehab Potential Good    PT Frequency 3x / week    PT Duration 6 weeks    PT Treatment/Interventions ADLs/Self Care Home Management;Aquatic Therapy;Biofeedback;Cryotherapy;Electrical Stimulation;Moist Heat;DME Instruction;Gait training;Stair training;Functional mobility training;Therapeutic activities;Therapeutic exercise;Balance training;Neuromuscular re-education;Patient/family education;Manual techniques;Scar mobilization;Passive range of motion;Dry needling;Taping;Joint Manipulations    PT Next Visit Plan Continue to progress functional knee strength with focus on eccentric control. Use manual for pain control and to gain ROM as needed.  Complete progress note prior MD apt next Wednesday, August 11th.  PT Home Exercise Plan Eval: quad set, SLR, heel slides; 7/22: heel prop           Patient will benefit from skilled  therapeutic intervention in order to improve the following deficits and impairments:  Abnormal gait, Decreased activity tolerance, Decreased balance, Decreased range of motion, Decreased strength, Hypomobility, Increased edema, Impaired perceived functional ability, Impaired sensation, Pain  Visit Diagnosis: Other abnormalities of gait and mobility  Acute pain of right knee  Stiffness of right knee, not elsewhere classified     Problem List Patient Active Problem List   Diagnosis Date Noted  . Primary localized osteoarthritis of left knee 07/22/2019  . Primary osteoarthritis of right knee   . Constipation 09/05/2018  . Abdominal pain, chronic, epigastric 05/27/2018  . Dysphagia 05/27/2018  . Depression, major, single episode, moderate ([place]) 03/22/2018  . S/P complete hysterectomy 06/06/2017  . Derangement of posterior horn of medial meniscus of right knee   . Right knee pain 09/10/2015  . Iron deficiency anemia 10/16/2013  . Fibroids 08/21/2013  . Excessive or frequent menstruation 06/19/2013  . Morbid obesity ([place]) 07/12/2012  . Obstructive sleep apnea 06/21/2012  . Chest pain   . Hyperlipidemia   . Type 2 diabetes mellitus ([place])   . Obesity   . Laboratory test   . Nephrolithiasis   . Asthma   . Endometriosis   . Hypertension   . Iron deficiency anemia 05/11/2009  . GERD 05/11/2009   Ihor Austin, LPTA/CLT; CBIS [phone]  Aldona Lento 08/29/2019, 5:46 PM  [facility], [place], [address] Phone: [phone]   Fax:  [phone]  Name: KATORIA YETMAN MRN: [id number] Date of Birth: [birth year]

## 2019-09-01 ENCOUNTER — Encounter (HOSPITAL_COMMUNITY): Payer: Self-pay | Admitting: Physical Therapy

## 2019-09-01 ENCOUNTER — Ambulatory Visit (HOSPITAL_COMMUNITY): Payer: Medicare Other | Admitting: Physical Therapy

## 2019-09-01 ENCOUNTER — Other Ambulatory Visit: Payer: Self-pay

## 2019-09-01 DIAGNOSIS — R2689 Other abnormalities of gait and mobility: Secondary | ICD-10-CM

## 2019-09-01 DIAGNOSIS — M25561 Pain in right knee: Secondary | ICD-10-CM | POA: Diagnosis not present

## 2019-09-01 DIAGNOSIS — M25661 Stiffness of right knee, not elsewhere classified: Secondary | ICD-10-CM

## 2019-09-01 DIAGNOSIS — Z96651 Presence of right artificial knee joint: Secondary | ICD-10-CM | POA: Insufficient documentation

## 2019-09-01 NOTE — Therapy (Signed)
[facility] [address] [place], [place], [address] Phone: [phone]   Fax:  [phone] Knee Extension Right;AROM   Knee Extension Limitations 1   Knee Flexion Right;AROM   Knee Flexion Limitations 127    Physical Therapy Treatment  Patient Details  Name: Carrie Cook MRN: [id number] Date of Birth: 1960/04/25 Referring Provider (PT): Arther Abbott, MD   Encounter Date: 09/01/2019   PT End of Session - 09/01/19 1522    Visit Number 9    Number of Visits 18    Date for PT Re-Evaluation 09/26/19    Authorization Type Medicare Part A and B    Authorization Time Period 08/11/19 to 09/26/19    Progress Note Due on Visit 10    PT Start Time 1520    PT Stop Time 1600    PT Time Calculation (min) 40 min    Activity Tolerance Patient tolerated treatment well    Behavior During Therapy [facility] for tasks assessed/performed           Past Medical History:  Diagnosis Date  . Anemia    PT HAS HAD COLONOSCOPY AND ENDOSCOPY WORK UP - NO PROBLEMS FOUND AS SOURCE OF ANEMIA -- PT HAD IRON INFUSION AT [facility] 11/13/12-AFTER SEEING HEMATOLOGIST DR. Chestnut AND HE GAVE HEMATOLOGIC CLEARANCE FOR GASTRIC BYPASS SURGERY.  Marland Kitchen Anxiety   . Asthma    daily and prn inhalers  . Degenerative joint disease    right knee, spine - STATES INTERMITTENT  NUMBNESS DOWN RT LEG WITH PROLONGED STANDING OR WALKING- THINKS RELATED TO HER SPINE PROBLEMS  . Dental crowns present   . Depression   . Family history of anesthesia complication    pt's mother and sister have hx. of post-op N/V  . Fibroids    UTERINE  . Gastroesophageal reflux disease    none for 2 years since Bariatric surgery.  . H/O hiatal hernia   . History of endometriosis   . History of kidney stones   . Hyperlipidemia   . Hypertension    under control with med., has been on med. x 2 yr.  . Insulin dependent diabetes mellitus   . Lock jaw    jaw locks open if opens mouth wide  . Migraines    . Neuropathy    FEET  . Obesity   . Palpitations   . PONV (postoperative nausea and vomiting)   . Shortness of breath    with daily activities  . Sleep apnea    post-op didn't need CPAP but now states needs it again but not using    Past Surgical History:  Procedure Laterality Date  . ABDOMINAL HYSTERECTOMY N/A 06/06/2017   Procedure: HYSTERECTOMY ABDOMINAL;  Surgeon: Florian Buff, MD;  [facility];  Service: Gynecology;  Laterality: N/A;  . BREATH TEK H PYLORI N/A 08/13/2012   Procedure: BREATH TEK H PYLORI;  Surgeon: Edward Jolly, MD;  Location: [facility];  Service: General;  Laterality: N/A;  . CARPAL TUNNEL RELEASE  01/03/2012   Procedure: CARPAL TUNNEL RELEASE;  Surgeon: Wynonia Sours, MD;  Location: [facility];  Service: Orthopedics;  Laterality: Right;  . carpal tunnel release right hand Right 12/13  . COLONOSCOPY  05/2009   HWE:[id number] hemorrhoids otherwise normal colon, rectum and terminal ileum  . COLONOSCOPY WITH ESOPHAGOGASTRODUODENOSCOPY (EGD) N/A 10/28/2012   Procedure: COLONOSCOPY WITH ESOPHAGOGASTRODUODENOSCOPY (EGD);  Surgeon: Daneil Dolin, MD;  [facility];  Service: Endoscopy;  Laterality: N/A;  7:30  . DILITATION & CURRETTAGE/HYSTROSCOPY WITH NOVASURE ABLATION N/A 07/22/2014   Procedure: DILATATION & CURETTAGE/HYSTEROSCOPY WITH NOVASURE ABLATION; uterine length 6.0 cm; uterine width 3.8 cm; total ablation time 1 minute 15 seconds;  Surgeon: Florian Buff, MD;  [facility];  Service: Gynecology;  Laterality: N/A;  . ESOPHAGOGASTRODUODENOSCOPY  5/013/2011   VPX:TGGYIR esophagus/multiple polyps removed s/p (hyperplastic). Gastritis without H.pylori.  . ESOPHAGOGASTRODUODENOSCOPY (EGD) WITH PROPOFOL N/A 06/06/2018   with LA Grade A esophagitis s/p dilation.   Marland Kitchen GASTRIC ROUX-EN-Y N/A 11/19/2012   Procedure: LAPAROSCOPIC ROUX-EN-Y GASTRIC;  Surgeon: Edward Jolly, MD;  [facility];  Service: General;  Laterality:  N/A;  . GIVENS CAPSULE STUDY N/A 10/28/2012   Procedure: GIVENS CAPSULE STUDY;  Surgeon: Daneil Dolin, MD;  [facility];  Service: Endoscopy;  Laterality: N/A;  . KNEE ARTHROSCOPY WITH MEDIAL MENISECTOMY Right 10/28/2015   Procedure: RIGHT KNEE ARTHROSCOPY WITH MEDIAL MENISECTOMY;  Surgeon: Carole Civil, MD;  [facility];  Service: Orthopedics;  Laterality: Right;  . LAPAROSCOPY     due to endometriosis  . MALONEY DILATION N/A 06/06/2018   Procedure: Venia Minks DILATION;  Surgeon: Daneil Dolin, MD;  [facility];  Service: Endoscopy;  Laterality: N/A;  . PELVIC LAPAROSCOPY  11/04/1999   with fulguration of endometriosis  . SALPINGOOPHORECTOMY Bilateral 06/06/2017   Procedure: SALPINGO OOPHORECTOMY;  Surgeon: Florian Buff, MD;  [facility];  Service: Gynecology;  Laterality: Bilateral;  . TOTAL KNEE ARTHROPLASTY Right 07/22/2019   Procedure: TOTAL KNEE ARTHROPLASTY;  Surgeon: Carole Civil, MD;  [facility];  Service: Orthopedics;  Laterality: Right;  . TRIGGER FINGER RELEASE Right 09/24/2012   Procedure: RELEASE A-1 PULLEY OF RIGHT THUMB;  Surgeon: Wynonia Sours, MD;  Location: [facility];  Service: Orthopedics;  Laterality: Right;  . TUBAL LIGATION  1993    There were no vitals filed for this visit.   Subjective Assessment - 09/01/19 1523    Subjective Patient says she was really sore after Friday so she has been "babying it" since then. Says she is doing a little better today, knee pain is about a 2.    Patient Stated Goals get full ROM and be able to walk for exercise    Currently in Pain? Yes    Pain Score 2     Pain Location Knee    Pain Orientation Right    Pain Descriptors / Indicators Sore;Tightness    Pain Type Surgical pain    Pain Onset More than a month ago    Pain Frequency Constant                             [facility] Adult PT Treatment/Exercise - 09/01/19 0001      Knee/Hip Exercises:  Stretches   Gastroc Stretch Both;3 reps;30 seconds    Gastroc Stretch Limitations slant board       Knee/Hip Exercises: Aerobic   Recumbent Bike 4 min dynamic warmup seat 7 lv1      Knee/Hip Exercises: Standing   Heel Raises Both;20 reps    Heel Raises Limitations from incline     Knee Flexion Both;2 sets;10 reps    Knee Flexion Limitations 1#    Hip Abduction Both;2 sets;10 reps    Abduction Limitations 1#    Lateral Step Up Right;1 set;15 reps;Hand Hold: 1;Step Height: 6"    Forward Step  Up Right;1 set;15 reps;Hand Hold: 0;Step Height: 6"    Step Down Right;Hand Hold: 1;15 reps;2 sets;Step Height: 4";Step Height: 6"   1st set 4 inch, 2nd set 6 inch    Functional Squat 15 reps    Functional Squat Limitations minisquat    SLS 3 x 15" ea solid floor       Knee/Hip Exercises: Supine   Knee Extension Right;AROM    Knee Extension Limitations 1    Knee Flexion Right;AROM    Knee Flexion Limitations 127      Manual Therapy   Manual Therapy Soft tissue mobilization;Myofascial release    Manual therapy comments Manual complete separate than rest of tx    Soft tissue mobilization STM to distal quad    Myofascial Release scar tissue massage to address adhesions                     PT Short Term Goals - 08/14/19 0959      PT SHORT TERM GOAL #1   Title Pt will perform HEP correctly and consistently to improve ROM, strength, ambulation tolerance and standing tolerance.    Time 3    Period Weeks    Status On-going    Target Date 09/01/19      PT SHORT TERM GOAL #2   Title Pt will demonstrate R knee AROM 0-100 deg to improve ability to perform functional activities around the home.    Time 3    Period Weeks    Status On-going             PT Long Term Goals - 08/14/19 1000      PT LONG TERM GOAL #1   Title Pt will demonstrate R knee AROM 0-120 deg to improve ability to perform functional activities around the home.    Time 6    Period Weeks    Status On-going       PT LONG TERM GOAL #2   Title Pt will improve R knee strength to equal L knee strength to improve ability to perform transfers and ambulation without compensations.    Time 6    Period Weeks    Status On-going      PT LONG TERM GOAL #3   Title Pt will ambulate at 1.0 m/s without AD to allow pt to begin walking program for personal exercise program.    Time 6    Period Weeks    Status On-going                 Plan - 09/01/19 1606    Clinical Impression Statement Patient tolerated session well today. Patient able to return to use of 6 inch step with lateral step up and step down with minimal complaint of pain. Patient showing very good progress toward knee AROM goals. Added 1# weights to standing LE strength for strength progressions. Patient cued on proper form and positioning with standing knee flexion. Patient notes pain reduction post manual treatment.    Examination-Activity Limitations Locomotion Level;Sleep;Stand    Examination-Participation Restrictions Cleaning;Driving;Laundry    Stability/Clinical Decision Making Stable/Uncomplicated    Rehab Potential Good    PT Frequency 3x / week    PT Duration 6 weeks    PT Treatment/Interventions ADLs/Self Care Home Management;Aquatic Therapy;Biofeedback;Cryotherapy;Electrical Stimulation;Moist Heat;DME Instruction;Gait training;Stair training;Functional mobility training;Therapeutic activities;Therapeutic exercise;Balance training;Neuromuscular re-education;Patient/family education;Manual techniques;Scar mobilization;Passive range of motion;Dry needling;Taping;Joint Manipulations    PT Next Visit Plan Continue to progress functional knee strength with focus on  eccentric control. Use manual for pain control and to gain ROM as needed. Reassess next visit    PT Home Exercise Plan Eval: quad set, SLR, heel slides; 7/22: heel prop    Consulted and Agree with Plan of Care Patient           Patient will benefit from skilled  therapeutic intervention in order to improve the following deficits and impairments:  Abnormal gait, Decreased activity tolerance, Decreased balance, Decreased range of motion, Decreased strength, Hypomobility, Increased edema, Impaired perceived functional ability, Impaired sensation, Pain  Visit Diagnosis: Other abnormalities of gait and mobility  Acute pain of right knee  Stiffness of right knee, not elsewhere classified     Problem List Patient Active Problem List   Diagnosis Date Noted  . S/P total knee replacement, right 07/22/19  09/01/2019  . Primary localized osteoarthritis of left knee 07/22/2019  . Primary osteoarthritis of right knee   . Constipation 09/05/2018  . Abdominal pain, chronic, epigastric 05/27/2018  . Dysphagia 05/27/2018  . Depression, major, single episode, moderate ([place]) 03/22/2018  . S/P complete hysterectomy 06/06/2017  . Derangement of posterior horn of medial meniscus of right knee   . Right knee pain 09/10/2015  . Iron deficiency anemia 10/16/2013  . Fibroids 08/21/2013  . Excessive or frequent menstruation 06/19/2013  . Morbid obesity ([place]) 07/12/2012  . Obstructive sleep apnea 06/21/2012  . Chest pain   . Hyperlipidemia   . Type 2 diabetes mellitus ([place])   . Obesity   . Laboratory test   . Nephrolithiasis   . Asthma   . Endometriosis   . Hypertension   . Iron deficiency anemia 05/11/2009  . GERD 05/11/2009   4:10 PM, 09/01/19 Josue Hector PT DPT  Physical Therapist with [facility]  [phone] [place] [address] [place], [place], [address] Phone: [phone]   Fax:  [phone]  Name: AMYLA HEFFNER MRN: [id number] Date of Birth: 1960-06-22

## 2019-09-02 ENCOUNTER — Other Ambulatory Visit: Payer: Self-pay | Admitting: Family Medicine

## 2019-09-03 ENCOUNTER — Ambulatory Visit (HOSPITAL_COMMUNITY): Payer: Medicare Other | Admitting: Physical Therapy

## 2019-09-03 ENCOUNTER — Other Ambulatory Visit: Payer: Self-pay

## 2019-09-03 ENCOUNTER — Encounter (HOSPITAL_COMMUNITY): Payer: Self-pay | Admitting: Physical Therapy

## 2019-09-03 ENCOUNTER — Other Ambulatory Visit: Payer: Self-pay | Admitting: Family Medicine

## 2019-09-03 DIAGNOSIS — M25561 Pain in right knee: Secondary | ICD-10-CM | POA: Diagnosis not present

## 2019-09-03 DIAGNOSIS — R2689 Other abnormalities of gait and mobility: Secondary | ICD-10-CM

## 2019-09-03 DIAGNOSIS — F339 Major depressive disorder, recurrent, unspecified: Secondary | ICD-10-CM | POA: Diagnosis not present

## 2019-09-03 DIAGNOSIS — M25661 Stiffness of right knee, not elsewhere classified: Secondary | ICD-10-CM

## 2019-09-03 NOTE — Patient Instructions (Signed)
Access Code: [email or web address] URL: [email or web address] Date: 09/03/2019 Prepared by: Josue Hector  Exercises Gastroc Stretch on Wall - 1 x daily - 5 x weekly - 1 sets - 3 reps - 30 sec hold Supine Heel Slide - 1 x daily - 3 x weekly - 2 sets - 10 reps Active Straight Leg Raise with Quad Set - 1 x daily - 3 x weekly - 2 sets - 10 reps Supine Bridge - 1 x daily - 3 x weekly - 2 sets - 10 reps Prone Knee Extension Hang - 1 x daily - 3 x weekly - 1 sets - 1 reps - 2 min hold Standing Heel Raise - 1 x daily - 3 x weekly - 2 sets - 10 reps Standing Hip Abduction - 1 x daily - 3 x weekly - 2 sets - 10 reps Standing Knee Flexion AROM with Chair Support - 1 x daily - 3 x weekly - 2 sets - 10 reps Squat with Chair Touch - 1 x daily - 3 x weekly - 2 sets - 10 reps Step Up - 1 x daily - 3 x weekly - 2 sets - 10 reps Lateral Step Up - 1 x daily - 3 x weekly - 2 sets - 10 reps Forward Step Down - 1 x daily - 3 x weekly - 2 sets - 10 reps Single Leg Stance - 1 x daily - 3 x weekly - 1 sets - 3 reps - 20 sec hold

## 2019-09-03 NOTE — Telephone Encounter (Signed)
Lvm to schedule appt.

## 2019-09-03 NOTE — Telephone Encounter (Signed)
Scheduled 9/13

## 2019-09-03 NOTE — Telephone Encounter (Signed)
Last seen feb 2021. Please schedule an office visit and then route back to nurses

## 2019-09-03 NOTE — Therapy (Signed)
[place], [place], [address] Phone: [phone]   Fax:  [phone]  Physical Therapy Treatment  Patient Details  Name: Carrie Cook MRN: [id number] Date of Birth: [birth year] Referring Provider (PT): Arther Abbott, MD  PHYSICAL THERAPY DISCHARGE SUMMARY  Visits from Start of Care: 10  Current functional level related to goals / functional outcomes: See below    Remaining deficits: See below    Education / Equipment: See assessment  Plan: Patient agrees to discharge.  Patient goals were met. Patient is being discharged due to meeting the stated rehab goals.  ?????      Encounter Date: 09/03/2019   PT End of Session - 09/03/19 1659    Visit Number 10    Number of Visits 18    Date for PT Re-Evaluation 09/26/19    Authorization Type Medicare Part A and B    Authorization Time Period 08/11/19 to 09/26/19    Progress Note Due on Visit 18    PT Start Time 8177    PT Stop Time 1745    PT Time Calculation (min) 55 min    Activity Tolerance Patient tolerated treatment well    Behavior During Therapy [facility] for tasks assessed/performed           Past Medical History:  Diagnosis Date  . Anemia    PT HAS HAD COLONOSCOPY AND ENDOSCOPY WORK UP - NO PROBLEMS FOUND AS SOURCE OF ANEMIA -- PT HAD IRON INFUSION AT [facility] 11/13/12-AFTER SEEING HEMATOLOGIST DR. [place] AND HE GAVE HEMATOLOGIC CLEARANCE FOR GASTRIC BYPASS SURGERY.  Marland Kitchen Anxiety   . Asthma    daily and prn inhalers  . Degenerative joint disease    right knee, spine - STATES INTERMITTENT  NUMBNESS DOWN RT LEG WITH PROLONGED STANDING OR WALKING- THINKS RELATED TO Carrie Cook SPINE PROBLEMS  . Dental crowns present   . Depression   . Family history of anesthesia complication    Carrie Cook and Carrie Cook have hx. of post-op N/V  . Fibroids    UTERINE  . Gastroesophageal reflux disease    none for 2 years since Bariatric surgery.  . H/O hiatal hernia   .  History of endometriosis   . History of kidney stones   . Hyperlipidemia   . Hypertension    under control with med., has been on med. x 2 yr.  . Insulin dependent diabetes mellitus   . Lock jaw    jaw locks open if opens mouth wide  . Migraines   . Neuropathy    FEET  . Obesity   . Palpitations   . PONV (postoperative nausea and vomiting)   . Shortness of breath    with daily activities  . Sleep apnea    post-op didn't need CPAP but now states needs it again but not using    Past Surgical History:  Procedure Laterality Date  . ABDOMINAL HYSTERECTOMY N/A 06/06/2017   Procedure: HYSTERECTOMY ABDOMINAL;  Surgeon: Florian Buff, MD;  Location: [facility];  Service: Gynecology;  Laterality: N/A;  . BREATH TEK H PYLORI N/A 08/13/2012   Procedure: BREATH TEK H PYLORI;  Surgeon: Edward Jolly, MD;  Location: [facility];  Service: General;  Laterality: N/A;  . CARPAL TUNNEL RELEASE  01/03/2012   Procedure: CARPAL TUNNEL RELEASE;  Surgeon: Wynonia Sours, MD;  Location: [facility];  Service: Orthopedics;  Laterality: Right;  . carpal tunnel release right hand Right 12/13  .  COLONOSCOPY  05/2009   LDJ:TTSVXBLT hemorrhoids otherwise normal colon, rectum and terminal ileum  . COLONOSCOPY WITH ESOPHAGOGASTRODUODENOSCOPY (EGD) N/A 10/28/2012   Procedure: COLONOSCOPY WITH ESOPHAGOGASTRODUODENOSCOPY (EGD);  Surgeon: Daneil Dolin, MD;  [facility];  Service: Endoscopy;  Laterality: N/A;  7:30  . DILITATION & CURRETTAGE/HYSTROSCOPY WITH NOVASURE ABLATION N/A 07/22/2014   Procedure: DILATATION & CURETTAGE/HYSTEROSCOPY WITH NOVASURE ABLATION; uterine length 6.0 cm; uterine width 3.8 cm; total ablation time 1 minute 15 seconds;  Surgeon: Florian Buff, MD;  Location: [facility];  Service: Gynecology;  Laterality: N/A;  . ESOPHAGOGASTRODUODENOSCOPY  5/013/2011   JQZ:ESPQZR esophagus/multiple polyps removed s/p (hyperplastic). Gastritis without H.pylori.  .  ESOPHAGOGASTRODUODENOSCOPY (EGD) WITH PROPOFOL N/A 06/06/2018   with LA Grade A esophagitis s/p dilation.   Marland Kitchen GASTRIC ROUX-EN-Y N/A 11/19/2012   Procedure: LAPAROSCOPIC ROUX-EN-Y GASTRIC;  Surgeon: Edward Jolly, MD;  Location: [facility];  Service: General;  Laterality: N/A;  . GIVENS CAPSULE STUDY N/A 10/28/2012   Procedure: GIVENS CAPSULE STUDY;  Surgeon: Daneil Dolin, MD;  [facility];  Service: Endoscopy;  Laterality: N/A;  . KNEE ARTHROSCOPY WITH MEDIAL MENISECTOMY Right 10/28/2015   Procedure: RIGHT KNEE ARTHROSCOPY WITH MEDIAL MENISECTOMY;  Surgeon: Carole Civil, MD;  [facility];  Service: Orthopedics;  Laterality: Right;  . LAPAROSCOPY     due to endometriosis  . MALONEY DILATION N/A 06/06/2018   Procedure: Venia Minks DILATION;  Surgeon: Daneil Dolin, MD;  [facility];  Service: Endoscopy;  Laterality: N/A;  . PELVIC LAPAROSCOPY  11/04/1999   with fulguration of endometriosis  . SALPINGOOPHORECTOMY Bilateral 06/06/2017   Procedure: SALPINGO OOPHORECTOMY;  Surgeon: Florian Buff, MD;  Location: [facility];  Service: Gynecology;  Laterality: Bilateral;  . TOTAL KNEE ARTHROPLASTY Right 07/22/2019   Procedure: TOTAL KNEE ARTHROPLASTY;  Surgeon: Carole Civil, MD;  Location[facility];  Service: Orthopedics;  Laterality: Right;  . TRIGGER FINGER RELEASE Right 09/24/2012   Procedure: RELEASE A-1 PULLEY OF RIGHT THUMB;  Surgeon: Wynonia Sours, MD;  Location: [facility];  Service: Orthopedics;  Laterality: Right;  . TUBAL LIGATION  1993    There were no vitals filed for this visit.   Subjective Assessment - 09/03/19 1656    Subjective Patient says she had a workout today. Says she did a lot of walking going to MD appointments and to the store today. Patient says knee is better, but still has mild pain. Says she feels about 75-80% improvement overall since starting therapy.    Limitations Standing;Lifting;Walking;House hold activities     Patient Stated Goals get full ROM and be able to walk for exercise    Currently in Pain? Yes    Pain Score 1     Pain Location Knee    Pain Orientation Right    Pain Descriptors / Indicators Sore;Tightness    Pain Onset More than a month ago    Pain Frequency Constant    Aggravating Factors  bending, stairs, sleeping    Pain Relieving Factors rest    Effect of Pain on Daily Activities Limits              [facility] PT Assessment - 09/03/19 0001      Assessment   Medical Diagnosis R TKA    Referring Provider (PT) Arther Abbott, MD    Onset Date/Surgical Date 07/22/19    Next MD Visit 09/04/19    Prior Therapy acute and HHPT with good results  Precautions   Precautions None      Restrictions   Weight Bearing Restrictions No      Balance Screen   Has the patient fallen in the past 6 months No      [place] residence      Prior Function   Level of Independence Independent with household mobility with device      Cognition   Overall Cognitive Status Within Functional Limits for tasks assessed      Observation/Other Assessments   Focus on Therapeutic Outcomes (FOTO)  47% limited    was 57% limited      AROM   Right Knee Extension 0   was 7   Right Knee Flexion 127   100     Strength   Right Hip Flexion 5/5   was 4+   Right Hip ABduction 5/5    Left Hip Flexion 5/5   was 4+   Left Hip ABduction 5/5    Right Knee Flexion 5/5   was 4+   Right Knee Extension 4+/5    Left Knee Flexion 5/5    Left Knee Extension 5/5      Ambulation/Gait   Ambulation/Gait Yes    Ambulation/Gait Assistance 7: Independent    Ambulation Distance (Feet) 512 Feet    Assistive device None    Gait Pattern Within Functional Limits    Ambulation Surface Level;Indoor    Gait velocity 1.3 m/s    Stairs Yes    Stairs Assistance 7: Independent    Stair Management Technique No rails;Alternating pattern    Number of Stairs 8    Height of Stairs 7        Static Standing Balance   Static Standing Balance -  Activities  Single Leg Stance - Right Leg;Single Leg Stance - Left Leg    Static Standing - Comment/# of Minutes 25 sec bilateral                          [facility] Adult PT Treatment/Exercise - 09/03/19 0001      Knee/Hip Exercises: Stretches   Gastroc Stretch Both;3 reps;30 seconds    Gastroc Stretch Limitations slant board       Knee/Hip Exercises: Aerobic   Recumbent Bike 4 min dynamic warmup seat 7 lv1      Knee/Hip Exercises: Standing   Heel Raises Both;20 reps    Heel Raises Limitations from incline     Lateral Step Up 1 set;15 reps;Step Height: 6";Both    Forward Step Up 1 set;15 reps;Step Height: 6";Both    Step Down Both;1 set;15 reps;Hand Hold: 1;Step Height: 6"                  PT Education - 09/03/19 1659    Education Details on reassessment findings and transition to HEP and walking program for DC    Person(s) Educated Patient    Methods Explanation    Comprehension Verbalized understanding            PT Short Term Goals - 09/03/19 1729      PT SHORT TERM GOAL #1   Title Pt will perform HEP correctly and consistently to improve ROM, strength, ambulation tolerance and standing tolerance.    Baseline Reports and demos compliance    Time 3    Period Weeks    Status Achieved    Target Date 09/01/19      PT  SHORT TERM GOAL #2   Title Pt will demonstrate R knee AROM 0-100 deg to improve ability to perform functional activities around the home.    Baseline Current 0-127 dg    Time 3    Period Weeks    Status Achieved             PT Long Term Goals - 09/03/19 1730      PT LONG TERM GOAL #1   Title Pt will demonstrate R knee AROM 0-120 deg to improve ability to perform functional activities around the home.    Baseline current 0-127 dg    Time 6    Period Weeks    Status Achieved      PT LONG TERM GOAL #2   Title Pt will improve R knee strength to equal L knee strength to  improve ability to perform transfers and ambulation without compensations.    Baseline See MMT    Time 6    Period Weeks    Status Achieved      PT LONG TERM GOAL #3   Title Pt will ambulate at 1.0 m/s without AD to allow pt to begin walking program for personal exercise program.    Baseline Current 1.3 m/s with no AD    Time 6    Period Weeks    Status Achieved                 Plan - 09/03/19 1749    Clinical Impression Statement Patient has made very good progress toward therapy goals and is currently with all goals met. Patient shows significant improvement with functional mobility, strength, balance and ROM. Patient still limited by mild pain, but says this has greatly improved within last week or so, and notes it is only bad when she "overdoes it". Patient reports no current deficits with normal ADLs. Educated patient on initiating walking program around Carrie Cook house, and on updated HEP handout. Patient being DC today with all goals met. Patient issued updated HEP and instructed to follow up with therapy services with any further questions or concerns.    Examination-Activity Limitations Locomotion Level;Sleep;Stand    Examination-Participation Restrictions Cleaning;Driving;Laundry    Stability/Clinical Decision Making Stable/Uncomplicated    Rehab Potential Good    PT Treatment/Interventions ADLs/Self Care Home Management;Aquatic Therapy;Biofeedback;Cryotherapy;Electrical Stimulation;Moist Heat;DME Instruction;Gait training;Stair training;Functional mobility training;Therapeutic activities;Therapeutic exercise;Balance training;Neuromuscular re-education;Patient/family education;Manual techniques;Scar mobilization;Passive range of motion;Dry needling;Taping;Joint Manipulations    PT Next Visit Plan DC to HEP    PT Home Exercise Plan Eval: quad set, SLR, heel slides; 7/22: heel prop    Consulted and Agree with Plan of Care Patient           Patient will benefit from skilled  therapeutic intervention in order to improve the following deficits and impairments:  Abnormal gait, Decreased activity tolerance, Decreased balance, Decreased range of motion, Decreased strength, Hypomobility, Increased edema, Impaired perceived functional ability, Impaired sensation, Pain  Visit Diagnosis: Other abnormalities of gait and mobility  Acute pain of right knee  Stiffness of right knee, not elsewhere classified     Problem List Patient Active Problem List   Diagnosis Date Noted  . S/P total knee replacement, right 07/22/19  09/01/2019  . Primary localized osteoarthritis of left knee 07/22/2019  . Primary osteoarthritis of right knee   . Constipation 09/05/2018  . Abdominal pain, chronic, epigastric 05/27/2018  . Dysphagia 05/27/2018  . Depression, major, single episode, moderate ([place]) 03/22/2018  . S/P complete hysterectomy 06/06/2017  .  Derangement of posterior horn of medial meniscus of right knee   . Right knee pain 09/10/2015  . Iron deficiency anemia 10/16/2013  . Fibroids 08/21/2013  . Excessive or frequent menstruation 06/19/2013  . Morbid obesity ([place]) 07/12/2012  . Obstructive sleep apnea 06/21/2012  . Chest pain   . Hyperlipidemia   . Type 2 diabetes mellitus ([place])   . Obesity   . Laboratory test   . Nephrolithiasis   . Asthma   . Endometriosis   . Hypertension   . Iron deficiency anemia 05/11/2009  . GERD 05/11/2009    5:56 PM, 09/03/19 Josue Hector PT DPT  Physical Therapist with [facility]  [phone] [place] [address] [place], [place], [address] Phone: [phone]   Fax:  [phone]  Name: Carrie Cook MRN: [id number] Date of Birth: January 29, 1960

## 2019-09-04 ENCOUNTER — Ambulatory Visit (INDEPENDENT_AMBULATORY_CARE_PROVIDER_SITE_OTHER): Payer: Medicare Other | Admitting: Orthopedic Surgery

## 2019-09-04 ENCOUNTER — Encounter: Payer: Self-pay | Admitting: Orthopedic Surgery

## 2019-09-04 VITALS — Ht 63.0 in | Wt 194.0 lb

## 2019-09-04 DIAGNOSIS — Z96651 Presence of right artificial knee joint: Secondary | ICD-10-CM

## 2019-09-04 NOTE — Progress Notes (Signed)
Chief Complaint  Patient presents with  . Knee Pain    TKA still having some pain on outside and inside of the knee.     Patient had surgery in June on the 29th she had a left total knee she is doing reasonably well walking independently still having some swelling on certain days which responds well to ice and ibuprofen she also psych some Tylenol during the day for pain  She has full extension excellent quadricep strength 125 degrees of flexion knee is stable  Recommend she come in for her 24-month checkup

## 2019-09-05 ENCOUNTER — Ambulatory Visit (HOSPITAL_COMMUNITY): Payer: Medicare Other

## 2019-09-08 ENCOUNTER — Encounter (HOSPITAL_COMMUNITY): Payer: Medicare Other | Admitting: Physical Therapy

## 2019-09-10 ENCOUNTER — Inpatient Hospital Stay (HOSPITAL_COMMUNITY): Payer: Medicare Other

## 2019-09-10 ENCOUNTER — Encounter (HOSPITAL_COMMUNITY): Payer: Medicare Other | Admitting: Physical Therapy

## 2019-09-11 ENCOUNTER — Other Ambulatory Visit: Payer: Self-pay

## 2019-09-11 ENCOUNTER — Ambulatory Visit
Admission: RE | Admit: 2019-09-11 | Discharge: 2019-09-11 | Disposition: A | Discharge status: Home / Self Care | Payer: Medicare Other | Source: Ambulatory Visit | Attending: Emergency Medicine | Admitting: Emergency Medicine

## 2019-09-11 VITALS — BP 184/111 | HR 100 | Temp 98.3°F | Resp 16

## 2019-09-11 DIAGNOSIS — M5441 Lumbago with sciatica, right side: Secondary | ICD-10-CM

## 2019-09-11 MED ORDER — DEXAMETHASONE SODIUM PHOSPHATE 10 MG/ML IJ SOLN
10.0000 mg | Freq: Once | INTRAMUSCULAR | Status: AC
  Administered 2019-09-11: 10 mg via INTRAMUSCULAR

## 2019-09-11 MED ORDER — PREDNISONE 10 MG (21) PO TBPK
ORAL_TABLET | ORAL | 0 refills | Status: DC
Start: 2019-09-11 — End: 2019-09-28

## 2019-09-11 MED ORDER — ACETAMINOPHEN 500 MG PO TABS
500.0000 mg | ORAL_TABLET | Freq: Four times a day (QID) | ORAL | 0 refills | Status: DC | PRN
Start: 2019-09-11 — End: 2020-04-12

## 2019-09-11 MED ORDER — CYCLOBENZAPRINE HCL 5 MG PO TABS: 5.0000 mg | ORAL_TABLET | Freq: Three times a day (TID) | ORAL | 0 refills | Status: DC | PRN

## 2019-09-11 NOTE — Discharge Instructions (Addendum)
Rest, ice and heat as needed Ensure adequate ROM as tolerated. Prescribed Tylenol as needed for pain relief Prescribed flexeril  for muscle spasm.  Do not drive or operate heavy machinery while taking this medication Prescribed prednisone for inflammation Return here or go to ER if you have any new or worsening symptoms such as numbness/tingling of the inner thighs, loss of bladder or bowel control, headache/blurry vision, nausea/vomiting, confusion/altered mental status, dizziness, weakness, passing out, imbalance, etc..Marland Kitchen

## 2019-09-11 NOTE — ED Triage Notes (Signed)
Pain to lower back that runs down RT leg x 1 week. Hx of sciatic nerve pain.

## 2019-09-11 NOTE — ED Provider Notes (Signed)
[facility]   [id number] 09/11/19 Arrival Time: 2585   Chief Complaint  Patient presents with  . Back Pain     SUBJECTIVE: History from: patient.  Carrie Cook is a 59 y.o. female who presents to the urgent care for complaint of lower back pain that radiated to the lower leg for the past 1 week.  She localizes the pain to the right lower back.  She describes the pain as constant and achy.  She has tried OTC medications without relief.  Her symptoms are made worse with ROM.  She denies similar symptoms in the past.  Denies chills, fever, nausea, vomiting, diarrhea, blurry vision, paresthesia..  ROS: As per HPI.  All other pertinent ROS negative.      Past Medical History:  Diagnosis Date  . Anemia    PT HAS HAD COLONOSCOPY AND ENDOSCOPY WORK UP - NO PROBLEMS FOUND AS SOURCE OF ANEMIA -- PT HAD IRON INFUSION AT [facility] 11/13/12-AFTER SEEING HEMATOLOGIST DR. Bucklin AND HE GAVE HEMATOLOGIC CLEARANCE FOR GASTRIC BYPASS SURGERY.  Marland Kitchen Anxiety   . Asthma    daily and prn inhalers  . Degenerative joint disease    right knee, spine - STATES INTERMITTENT  NUMBNESS DOWN RT LEG WITH PROLONGED STANDING OR WALKING- THINKS RELATED TO HER SPINE PROBLEMS  . Dental crowns present   . Depression   . Family history of anesthesia complication    pt's mother and sister have hx. of post-op N/V  . Fibroids    UTERINE  . Gastroesophageal reflux disease    none for 2 years since Bariatric surgery.  . H/O hiatal hernia   . History of endometriosis   . History of kidney stones   . Hyperlipidemia   . Hypertension    under control with med., has been on med. x 2 yr.  . Insulin dependent diabetes mellitus   . Lock jaw    jaw locks open if opens mouth wide  . Migraines   . Neuropathy    FEET  . Obesity   . Palpitations   . PONV (postoperative nausea and vomiting)   . Shortness of breath    with daily activities  . Sleep apnea    post-op didn't need CPAP but now states needs it  again but not using   Past Surgical History:  Procedure Laterality Date  . ABDOMINAL HYSTERECTOMY N/A 06/06/2017   Procedure: HYSTERECTOMY ABDOMINAL;  Surgeon: Florian Buff, MD;  Location: [facility];  Service: Gynecology;  Laterality: N/A;  . BREATH TEK H PYLORI N/A 08/13/2012   Procedure: BREATH TEK H PYLORI;  Surgeon: Edward Jolly, MD;  Location: [facility];  Service: General;  Laterality: N/A;  . CARPAL TUNNEL RELEASE  01/03/2012   Procedure: CARPAL TUNNEL RELEASE;  Surgeon: Wynonia Sours, MD;  Location: [facility];  Service: Orthopedics;  Laterality: Right;  . carpal tunnel release right hand Right 12/13  . COLONOSCOPY  05/2009   IDP:[id number] hemorrhoids otherwise normal colon, rectum and terminal ileum  . COLONOSCOPY WITH ESOPHAGOGASTRODUODENOSCOPY (EGD) N/A 10/28/2012   Procedure: COLONOSCOPY WITH ESOPHAGOGASTRODUODENOSCOPY (EGD);  Surgeon: Daneil Dolin, MD;  Location: [facility];  Service: Endoscopy;  Laterality: N/A;  7:30  . DILITATION & CURRETTAGE/HYSTROSCOPY WITH NOVASURE ABLATION N/A 07/22/2014   Procedure: DILATATION & CURETTAGE/HYSTEROSCOPY WITH NOVASURE ABLATION; uterine length 6.0 cm; uterine width 3.8 cm; total ablation time 1 minute 15 seconds;  Surgeon: Florian Buff, MD;  Location: [facility];  Service: Gynecology;  Laterality: N/A;  . ESOPHAGOGASTRODUODENOSCOPY  5/013/2011   VOH:YWVPXT esophagus/multiple polyps removed s/p (hyperplastic). Gastritis without H.pylori.  . ESOPHAGOGASTRODUODENOSCOPY (EGD) WITH PROPOFOL N/A 06/06/2018   with LA Grade A esophagitis s/p dilation.   Marland Kitchen GASTRIC ROUX-EN-Y N/A 11/19/2012   Procedure: LAPAROSCOPIC ROUX-EN-Y GASTRIC;  Surgeon: Edward Jolly, MD;  Location: [facility];  Service: General;  Laterality: N/A;  . GIVENS CAPSULE STUDY N/A 10/28/2012   Procedure: GIVENS CAPSULE STUDY;  Surgeon: Daneil Dolin, MD;  Location: [facility];  Service: Endoscopy;  Laterality: N/A;  . KNEE ARTHROSCOPY WITH MEDIAL MENISECTOMY  Right 10/28/2015   Procedure: RIGHT KNEE ARTHROSCOPY WITH MEDIAL MENISECTOMY;  Surgeon: Carole Civil, MD;  Location: [facility];  Service: Orthopedics;  Laterality: Right;  . LAPAROSCOPY     due to endometriosis  . MALONEY DILATION N/A 06/06/2018   Procedure: Venia Minks DILATION;  Surgeon: Daneil Dolin, MD;  Location: [facility];  Service: Endoscopy;  Laterality: N/A;  . PELVIC LAPAROSCOPY  11/04/1999   with fulguration of endometriosis  . SALPINGOOPHORECTOMY Bilateral 06/06/2017   Procedure: SALPINGO OOPHORECTOMY;  Surgeon: Florian Buff, MD;  Location: [facility];  Service: Gynecology;  Laterality: Bilateral;  . TOTAL KNEE ARTHROPLASTY Right 07/22/2019   Procedure: TOTAL KNEE ARTHROPLASTY;  Surgeon: Carole Civil, MD;  Location: [facility];  Service: Orthopedics;  Laterality: Right;  . TRIGGER FINGER RELEASE Right 09/24/2012   Procedure: RELEASE A-1 PULLEY OF RIGHT THUMB;  Surgeon: Wynonia Sours, MD;  Location: [facility];  Service: Orthopedics;  Laterality: Right;  . TUBAL LIGATION  1993   Allergies  Allergen Reactions  . Qvar [Beclomethasone] Shortness Of Breath    Patient states that this medication causes her to feel short of breath  . Prozac [Fluoxetine Hcl] Hives  . Ace Inhibitors Cough  . Erythromycin Ethylsuccinate Hives and Nausea Only  . Fluoxetine Hcl Other (See Comments)    unknown  . Hydrochlorothiazide Cough  . Lipitor [Atorvastatin Calcium] Other (See Comments)    Body aches, flu-like symptoms  . Dyazide [Hydrochlorothiazide W-Triamterene] Cough  . Erythromycin Nausea Only  . Lisinopril Cough  . Pravastatin Other (See Comments)    BODY ACHES, FLU-LIKE SX.  Marland Kitchen Sulfa Antibiotics Rash  . Sulfonamide Derivatives Hives and Rash   No current facility-administered medications on file prior to encounter.   Current Outpatient Medications on File Prior to Encounter  Medication Sig Dispense Refill  . albuterol (PROVENTIL HFA;VENTOLIN HFA) 108 (90 Base) MCG/ACT  inhaler Inhale 2 puffs into the lungs every 6 (six) hours as needed for wheezing. 1 Inhaler 6  . amLODipine (NORVASC) 2.5 MG tablet TAKE 1 TABLET BY MOUTH DAILY 90 tablet 0  . Calcium Citrate 200 MG TABS Take 400 mg by mouth daily.    . cetirizine (ZYRTEC) 10 MG tablet Take 10 mg by mouth daily.    . ergocalciferol (VITAMIN D2) 1.25 MG (50000 UT) capsule Take 1 capsule (50,000 Units total) by mouth once a week. (Patient taking differently: Take 50,000 Units by mouth every Wednesday. ) 16 capsule 3  . estradiol (ESTRACE) 2 MG tablet Take 1 tablet (2 mg total) by mouth at bedtime. (Patient taking differently: Take 2 mg by mouth every evening. ) 90 tablet 3  . fluticasone (FLOVENT HFA) 220 MCG/ACT inhaler Inhale 2 puffs into the lungs 2 (two) times daily. (Patient taking differently: Inhale 2 puffs into the lungs daily as needed (Shortness of breath). ) 1 Inhaler 12  . gabapentin (NEURONTIN) 300  MG capsule Take 1 capsule (300 mg total) by mouth 3 (three) times daily. 42 capsule 1  . Insulin Glargine (LANTUS SOLOSTAR) 100 UNIT/ML Solostar Pen Use 24 units qhs. May titrate to 30 units qhs (Patient taking differently: Inject 25 Units into the skin at bedtime. Use 24 units qhs. May titrate to 30 units qh) 5 pen 5  . Insulin Pen Needle (PEN NEEDLES) 32G X 4 MM MISC 32 g by Does not apply route daily. 90 each 0  . lamoTRIgine (LAMICTAL) 100 MG tablet Take 100 mg by mouth 2 (two) times daily.     Marland Kitchen losartan (COZAAR) 100 MG tablet TAKE 1 TABLET BY MOUTH DAILY 30 tablet 0  . Melatonin 3 MG SUBL Place 3 mg under the tongue at bedtime.     . metFORMIN (GLUCOPHAGE) 500 MG tablet TAKE 1 TABLET(500 MG) BY MOUTH TWICE DAILY WITH A MEAL 180 tablet 0  . methocarbamol (ROBAXIN) 500 MG tablet Take 1 tablet (500 mg total) by mouth every 6 (six) hours as needed for muscle spasms. (Patient not taking: Reported on 09/04/2019) 56 tablet 1  . Multiple Vitamin (MULTIVITAMIN WITH MINERALS) TABS tablet Take 1 tablet by mouth daily.      . polyethylene glycol (MIRALAX / GLYCOLAX) 17 g packet Take 17 g by mouth daily. (Patient not taking: Reported on 09/04/2019) 14 each 0  . rosuvastatin (CRESTOR) 5 MG tablet TAKE 1 TABLET BY MOUTH EVERY DAY 30 tablet 0  . venlafaxine (EFFEXOR) 75 MG tablet Take 75 mg by mouth 2 (two) times daily with a meal.   1   Social History   Socioeconomic History  . Marital status: Married    Spouse name: Herbie Baltimore  . Number of children: 1  . Years of education: Not on file  . Highest education level: Not on file  Occupational History  . Occupation: disability  Tobacco Use  . Smoking status: Never Smoker  . Smokeless tobacco: Never Used  Vaping Use  . Vaping Use: Never used  Substance and Sexual Activity  . Alcohol use: No  . Drug use: No  . Sexual activity: Not Currently    Birth control/protection: Surgical  Other Topics Concern  . Not on file  Social History Narrative  . Not on file   Social Determinants of Health   Financial Resource Strain:   . Difficulty of Paying Living Expenses: Not on file  Food Insecurity:   . Worried About Charity fundraiser in the Last Year: Not on file  . Ran Out of Food in the Last Year: Not on file  Transportation Needs:   . Lack of Transportation (Medical): Not on file  . Lack of Transportation (Non-Medical): Not on file  Physical Activity:   . Days of Exercise per Week: Not on file  . Minutes of Exercise per Session: Not on file  Stress:   . Feeling of Stress : Not on file  Social Connections:   . Frequency of Communication with Friends and Family: Not on file  . Frequency of Social Gatherings with Friends and Family: Not on file  . Attends Religious Services: Not on file  . Active Member of Clubs or Organizations: Not on file  . Attends Archivist Meetings: Not on file  . Marital Status: Not on file  Intimate Partner Violence:   . Fear of Current or Ex-Partner: Not on file  . Emotionally Abused: Not on file  . Physically Abused:  Not on file  . Sexually  Abused: Not on file   Family History  Problem Relation Age of Onset  . Hypertension Father   . Hyperlipidemia Father   . COPD Father   . COPD Mother   . Heart disease Mother   . Cancer Mother        Cervix  . Anesthesia problems Mother        post-op N/V  . Thyroid disease Mother   . Diabetes Maternal Grandfather   . Thyroid disease Sister   . Anesthesia problems Sister        post-op N/V  . Thyroid disease Paternal Uncle   . Diabetes Other   . Colon cancer Neg Hx   . Colon polyps Neg Hx     OBJECTIVE:  Vitals:   09/11/19 1010  BP: (!) 184/111  Pulse: 100  Resp: 16  Temp: 98.3 F (36.8 C)  TempSrc: Oral  SpO2: 98%     Physical Exam Vitals and nursing note reviewed.  Constitutional:      General: She is not in acute distress.    Appearance: Normal appearance. She is normal weight. She is not ill-appearing, toxic-appearing or diaphoretic.  HENT:     Head: Normocephalic.  Cardiovascular:     Rate and Rhythm: Normal rate and regular rhythm.     Pulses: Normal pulses.     Heart sounds: Normal heart sounds. No murmur heard.  No friction rub. No gallop.   Pulmonary:     Effort: Pulmonary effort is normal. No respiratory distress.     Breath sounds: Normal breath sounds. No stridor. No wheezing, rhonchi or rales.  Chest:     Chest wall: No tenderness.  Musculoskeletal:     Thoracic back: Normal.     Lumbar back: Spasms and tenderness present.     Comments: Back:  Patient ambulates from chair to exam table without difficulty.  Inspection: Skin clear and intact without obvious swelling, erythema, or ecchymosis. Warm to the touch  Palpation: Vertebral processes nontender. Tenderness about the lower right paravertebral muscles  ROM: FROM Strength: 5/5 hip flexion, 5/5 knee extension, 5/5 knee flexion, 5/5 plantar flexion, 5/5 dorsiflexion   Special Tests: Positive straight leg raise  Neurological:     Mental Status: She is alert and  oriented to person, place, and time.      LABS:  No results found for this or any previous visit (from the past 24 hour(s)).   ASSESSMENT & PLAN:  1. Acute right-sided low back pain with right-sided sciatica     Meds ordered this encounter  Medications  . predniSONE (STERAPRED UNI-PAK 21 TAB) 10 MG (21) TBPK tablet    Sig: Take 6 tabs by mouth daily  for 1 days, then 5 tabs for 1 days, then 4 tabs for 1 days, then 3 tabs for 1 days, 2 tabs for 1 days, then 1 tab by mouth daily for 1 days    Dispense:  21 tablet    Refill:  0  . acetaminophen (TYLENOL) 500 MG tablet    Sig: Take 1 tablet (500 mg total) by mouth every 6 (six) hours as needed.    Dispense:  30 tablet    Refill:  0  . cyclobenzaprine (FLEXERIL) 5 MG tablet    Sig: Take 1 tablet (5 mg total) by mouth 3 (three) times daily as needed.    Dispense:  30 tablet    Refill:  0    Discharge Instructions  Rest, ice and heat as needed Ensure  adequate ROM as tolerated. Prescribed Tylenol as needed for pain relief Prescribed flexeril  for muscle spasm.  Do not drive or operate heavy machinery while taking this medication Prescribed prednisone for inflammation Return here or go to ER if you have any new or worsening symptoms such as numbness/tingling of the inner thighs, loss of bladder or bowel control, headache/blurry vision, nausea/vomiting, confusion/altered mental status, dizziness, weakness, passing out, imbalance, etc...   Reviewed expectations re: course of current medical issues. Questions answered. Outlined signs and symptoms indicating need for more acute intervention. Patient verbalized understanding. After Visit Summary given.      Note: This document was prepared using Dragon voice recognition software and may include unintentional dictation errors.    Emerson Monte, FNP 09/11/19 1028

## 2019-09-12 ENCOUNTER — Ambulatory Visit (HOSPITAL_COMMUNITY): Payer: Medicare Other | Admitting: Nurse Practitioner

## 2019-09-12 ENCOUNTER — Encounter (HOSPITAL_COMMUNITY): Payer: Medicare Other

## 2019-09-15 ENCOUNTER — Encounter (HOSPITAL_COMMUNITY): Payer: Medicare Other

## 2019-09-17 ENCOUNTER — Encounter (HOSPITAL_COMMUNITY): Payer: Medicare Other

## 2019-09-19 ENCOUNTER — Encounter (HOSPITAL_COMMUNITY): Payer: Medicare Other

## 2019-09-22 ENCOUNTER — Encounter (HOSPITAL_COMMUNITY): Payer: Medicare Other | Admitting: Physical Therapy

## 2019-09-24 ENCOUNTER — Encounter (HOSPITAL_COMMUNITY): Payer: Medicare Other

## 2019-09-25 ENCOUNTER — Other Ambulatory Visit: Payer: Self-pay

## 2019-09-25 ENCOUNTER — Inpatient Hospital Stay (HOSPITAL_COMMUNITY): Payer: Medicare Other | Attending: Hematology

## 2019-09-25 DIAGNOSIS — D509 Iron deficiency anemia, unspecified: Secondary | ICD-10-CM | POA: Insufficient documentation

## 2019-09-25 LAB — COMPREHENSIVE METABOLIC PANEL
ALT: 19 U/L (ref 0–44)
AST: 16 U/L (ref 15–41)
Albumin: 3.8 g/dL (ref 3.5–5.0)
Alkaline Phosphatase: 96 U/L (ref 38–126)
Anion gap: 10 (ref 5–15)
BUN: 16 mg/dL (ref 6–20)
CO2: 21 mmol/L — ABNORMAL LOW (ref 22–32)
Calcium: 9.1 mg/dL (ref 8.9–10.3)
Chloride: 103 mmol/L (ref 98–111)
Creatinine, Ser: 0.73 mg/dL (ref 0.44–1.00)
GFR calc Af Amer: >60 mL/min (ref >60)
GFR calc non Af Amer: >60 mL/min (ref >60)
Glucose, Bld: 263 mg/dL — ABNORMAL HIGH (ref 70–99)
Potassium: 3.9 mmol/L (ref 3.5–5.1)
Sodium: 134 mmol/L — ABNORMAL LOW (ref 135–145)
Total Bilirubin: 0.3 mg/dL (ref 0.3–1.2)
Total Protein: 6.8 g/dL (ref 6.5–8.1)

## 2019-09-25 LAB — CBC WITH DIFFERENTIAL/PLATELET
Abs Immature Granulocytes: 0.02 10*3/uL (ref 0.00–0.07)
Basophils Absolute: 0 10*3/uL (ref 0.0–0.1)
Basophils Relative: 0 %
Eosinophils Absolute: 0.1 10*3/uL (ref 0.0–0.5)
Eosinophils Relative: 2 %
HCT: 44.8 % (ref 36.0–46.0)
Hemoglobin: 15.3 g/dL — ABNORMAL HIGH (ref 12.0–15.0)
Immature Granulocytes: 0 %
Lymphocytes Relative: 18 %
Lymphs Abs: 1.5 10*3/uL (ref 0.7–4.0)
MCH: 31.5 pg (ref 26.0–34.0)
MCHC: 34.2 g/dL (ref 30.0–36.0)
MCV: 92.2 fL (ref 80.0–100.0)
Monocytes Absolute: 0.5 10*3/uL (ref 0.1–1.0)
Monocytes Relative: 6 %
Neutro Abs: 6.4 10*3/uL (ref 1.7–7.7)
Neutrophils Relative %: 74 %
Platelets: 240 10*3/uL (ref 150–400)
RBC: 4.86 MIL/uL (ref 3.87–5.11)
RDW: 11.2 % — ABNORMAL LOW (ref 11.5–15.5)
WBC: 8.7 10*3/uL (ref 4.0–10.5)
nRBC: 0 % (ref 0.0–0.2)

## 2019-09-25 LAB — LACTATE DEHYDROGENASE: LDH: 138 U/L (ref 98–192)

## 2019-09-25 LAB — IRON AND TIBC
Iron: 134 ug/dL (ref 28–170)
Saturation Ratios: 43 % — ABNORMAL HIGH (ref 10.4–31.8)
TIBC: 309 ug/dL (ref 250–450)
UIBC: 175 ug/dL

## 2019-09-25 LAB — FERRITIN: Ferritin: 55 ng/mL (ref 11–307)

## 2019-09-25 LAB — FOLATE: Folate: 9.4 ng/mL (ref >5.9)

## 2019-09-25 LAB — VITAMIN B12: Vitamin B-12: 505 pg/mL (ref 180–914)

## 2019-09-25 LAB — VITAMIN D 25 HYDROXY (VIT D DEFICIENCY, FRACTURES): Vit D, 25-Hydroxy: 67.88 ng/mL (ref 30–100)

## 2019-09-26 ENCOUNTER — Encounter (HOSPITAL_COMMUNITY): Payer: Medicare Other

## 2019-09-28 ENCOUNTER — Other Ambulatory Visit: Payer: Self-pay

## 2019-09-28 ENCOUNTER — Ambulatory Visit
Admission: EM | Admit: 2019-09-28 | Discharge: 2019-09-28 | Disposition: A | Discharge status: Home / Self Care | Payer: Medicare Other | Attending: Emergency Medicine | Admitting: Emergency Medicine

## 2019-09-28 DIAGNOSIS — M5441 Lumbago with sciatica, right side: Secondary | ICD-10-CM | POA: Diagnosis not present

## 2019-09-28 MED ORDER — PREDNISONE 10 MG (21) PO TBPK
ORAL_TABLET | Freq: Every day | ORAL | 0 refills | Status: DC
Start: 2019-09-28 — End: 2019-10-09

## 2019-09-28 MED ORDER — TIZANIDINE HCL 2 MG PO CAPS
2.0000 mg | ORAL_CAPSULE | Freq: Three times a day (TID) | ORAL | 0 refills | Status: DC
Start: 2019-09-28 — End: 2019-10-02

## 2019-09-28 MED ORDER — DEXAMETHASONE SODIUM PHOSPHATE 10 MG/ML IJ SOLN
10.0000 mg | Freq: Once | INTRAMUSCULAR | Status: AC
  Administered 2019-09-28: 10 mg via INTRAMUSCULAR

## 2019-09-28 NOTE — ED Provider Notes (Signed)
Carrie Cook   [id number] 09/28/19 Arrival Time: 9833  AS:NKNL PAIN  SUBJECTIVE: History from: patient. Carrie Cook is a 59 y.o. female complains of acute on chronic RT low back pain that flared up a few days ago.  States she may have "overdid" it on friday.  Localizes the pain to the RT low back.  Describes the pain as constant and sharp in character.  Has tried OTC medications without relief.  Symptoms are made worse with movement.  Reports similar symptoms in the past that improved with steroid shot and steroid.  Denies fever, chills, erythema, ecchymosis, effusion, weakness, numbness and tingling, saddle paresthesias, loss of bowel or bladder function.      ROS: As per HPI.  All other pertinent ROS negative.     Past Medical History:  Diagnosis Date  . Anemia    PT HAS HAD COLONOSCOPY AND ENDOSCOPY WORK UP - NO PROBLEMS FOUND AS SOURCE OF ANEMIA -- PT HAD IRON INFUSION AT [facility] 11/13/12-AFTER SEEING HEMATOLOGIST DR. Jones AND HE GAVE HEMATOLOGIC CLEARANCE FOR GASTRIC BYPASS SURGERY.  Marland Kitchen Anxiety   . Asthma    daily and prn inhalers  . Degenerative joint disease    right knee, spine - STATES INTERMITTENT  NUMBNESS DOWN RT LEG WITH PROLONGED STANDING OR WALKING- THINKS RELATED TO HER SPINE PROBLEMS  . Dental crowns present   . Depression   . Family history of anesthesia complication    pt's mother and sister have hx. of post-op N/V  . Fibroids    UTERINE  . Gastroesophageal reflux disease    none for 2 years since Bariatric surgery.  . H/O hiatal hernia   . History of endometriosis   . History of kidney stones   . Hyperlipidemia   . Hypertension    under control with med., has been on med. x 2 yr.  . Insulin dependent diabetes mellitus   . Lock jaw    jaw locks open if opens mouth wide  . Migraines   . Neuropathy    FEET  . Obesity   . Palpitations   . PONV (postoperative nausea and vomiting)   . Shortness of breath    with daily activities  . Sleep  apnea    post-op didn't need CPAP but now states needs it again but not using   Past Surgical History:  Procedure Laterality Date  . ABDOMINAL HYSTERECTOMY N/A 06/06/2017   Procedure: HYSTERECTOMY ABDOMINAL;  Surgeon: Florian Buff, MD;  Location: [facility];  Service: Gynecology;  Laterality: N/A;  . BREATH TEK H PYLORI N/A 08/13/2012   Procedure: BREATH TEK H PYLORI;  Surgeon: Edward Jolly, MD;  Location: [facility];  Service: General;  Laterality: N/A;  . CARPAL TUNNEL RELEASE  01/03/2012   Procedure: CARPAL TUNNEL RELEASE;  Surgeon: Wynonia Sours, MD;  Location: [facility];  Service: Orthopedics;  Laterality: Right;  . carpal tunnel release right hand Right 12/13  . COLONOSCOPY  05/2009   ZJQ:[id number] hemorrhoids otherwise normal colon, rectum and terminal ileum  . COLONOSCOPY WITH ESOPHAGOGASTRODUODENOSCOPY (EGD) N/A 10/28/2012   Procedure: COLONOSCOPY WITH ESOPHAGOGASTRODUODENOSCOPY (EGD);  Surgeon: Daneil Dolin, MD;  Location: [facility];  Service: Endoscopy;  Laterality: N/A;  7:30  . DILITATION & CURRETTAGE/HYSTROSCOPY WITH NOVASURE ABLATION N/A 07/22/2014   Procedure: DILATATION & CURETTAGE/HYSTEROSCOPY WITH NOVASURE ABLATION; uterine length 6.0 cm; uterine width 3.8 cm; total ablation time 1 minute 15 seconds;  Surgeon: Florian Buff, MD;  Location:  [facility];  Service: Gynecology;  Laterality: N/A;  . ESOPHAGOGASTRODUODENOSCOPY  5/013/2011   DJS:HFWYOV esophagus/multiple polyps removed s/p (hyperplastic). Gastritis without H.pylori.  . ESOPHAGOGASTRODUODENOSCOPY (EGD) WITH PROPOFOL N/A 06/06/2018   with LA Grade A esophagitis s/p dilation.   Marland Kitchen GASTRIC ROUX-EN-Y N/A 11/19/2012   Procedure: LAPAROSCOPIC ROUX-EN-Y GASTRIC;  Surgeon: Edward Jolly, MD;  Location: [facility];  Service: General;  Laterality: N/A;  . GIVENS CAPSULE STUDY N/A 10/28/2012   Procedure: GIVENS CAPSULE STUDY;  Surgeon: Daneil Dolin, MD;  [facility];  Service: Endoscopy;   Laterality: N/A;  . KNEE ARTHROSCOPY WITH MEDIAL MENISECTOMY Right 10/28/2015   Procedure: RIGHT KNEE ARTHROSCOPY WITH MEDIAL MENISECTOMY;  Surgeon: Carole Civil, MD;  Location: [facility];  Service: Orthopedics;  Laterality: Right;  . LAPAROSCOPY     due to endometriosis  . MALONEY DILATION N/A 06/06/2018   Procedure: Venia Minks DILATION;  Surgeon: Daneil Dolin, MD;  [facility];  Service: Endoscopy;  Laterality: N/A;  . PELVIC LAPAROSCOPY  11/04/1999   with fulguration of endometriosis  . SALPINGOOPHORECTOMY Bilateral 06/06/2017   Procedure: SALPINGO OOPHORECTOMY;  Surgeon: Florian Buff, MD;  Location: [facility];  Service: Gynecology;  Laterality: Bilateral;  . TOTAL KNEE ARTHROPLASTY Right 07/22/2019   Procedure: TOTAL KNEE ARTHROPLASTY;  Surgeon: Carole Civil, MD;  Location: [facility];  Service: Orthopedics;  Laterality: Right;  . TRIGGER FINGER RELEASE Right 09/24/2012   Procedure: RELEASE A-1 PULLEY OF RIGHT THUMB;  Surgeon: Wynonia Sours, MD;  Location: [facility];  Service: Orthopedics;  Laterality: Right;  . TUBAL LIGATION  1993   Allergies  Allergen Reactions  . Qvar [Beclomethasone] Shortness Of Breath    Patient states that this medication causes her to feel short of breath  . Prozac [Fluoxetine Hcl] Hives  . Ace Inhibitors Cough  . Erythromycin Ethylsuccinate Hives and Nausea Only  . Fluoxetine Hcl Other (See Comments)    unknown  . Hydrochlorothiazide Cough  . Lipitor [Atorvastatin Calcium] Other (See Comments)    Body aches, flu-like symptoms  . Dyazide [Hydrochlorothiazide W-Triamterene] Cough  . Erythromycin Nausea Only  . Lisinopril Cough  . Pravastatin Other (See Comments)    BODY ACHES, FLU-LIKE SX.  Marland Kitchen Sulfa Antibiotics Rash  . Sulfonamide Derivatives Hives and Rash   No current facility-administered medications on file prior to encounter.   Current Outpatient Medications on File Prior to Encounter  Medication Sig Dispense Refill   . acetaminophen (TYLENOL) 500 MG tablet Take 1 tablet (500 mg total) by mouth every 6 (six) hours as needed. 30 tablet 0  . albuterol (PROVENTIL HFA;VENTOLIN HFA) 108 (90 Base) MCG/ACT inhaler Inhale 2 puffs into the lungs every 6 (six) hours as needed for wheezing. 1 Inhaler 6  . amLODipine (NORVASC) 2.5 MG tablet TAKE 1 TABLET BY MOUTH DAILY 90 tablet 0  . Calcium Citrate 200 MG TABS Take 400 mg by mouth daily.    . cetirizine (ZYRTEC) 10 MG tablet Take 10 mg by mouth daily.    . ergocalciferol (VITAMIN D2) 1.25 MG (50000 UT) capsule Take 1 capsule (50,000 Units total) by mouth once a week. (Patient taking differently: Take 50,000 Units by mouth every Wednesday. ) 16 capsule 3  . estradiol (ESTRACE) 2 MG tablet Take 1 tablet (2 mg total) by mouth at bedtime. (Patient taking differently: Take 2 mg by mouth every evening. ) 90 tablet 3  . fluticasone (FLOVENT HFA) 220 MCG/ACT inhaler Inhale 2 puffs into  the lungs 2 (two) times daily. (Patient taking differently: Inhale 2 puffs into the lungs daily as needed (Shortness of breath). ) 1 Inhaler 12  . gabapentin (NEURONTIN) 300 MG capsule Take 1 capsule (300 mg total) by mouth 3 (three) times daily. 42 capsule 1  . Insulin Glargine (LANTUS SOLOSTAR) 100 UNIT/ML Solostar Pen Use 24 units qhs. May titrate to 30 units qhs (Patient taking differently: Inject 25 Units into the skin at bedtime. Use 24 units qhs. May titrate to 30 units qh) 5 pen 5  . Insulin Pen Needle (PEN NEEDLES) 32G X 4 MM MISC 32 g by Does not apply route daily. 90 each 0  . lamoTRIgine (LAMICTAL) 100 MG tablet Take 100 mg by mouth 2 (two) times daily.     Marland Kitchen losartan (COZAAR) 100 MG tablet TAKE 1 TABLET BY MOUTH DAILY 30 tablet 0  . Melatonin 3 MG SUBL Place 3 mg under the tongue at bedtime.     . metFORMIN (GLUCOPHAGE) 500 MG tablet TAKE 1 TABLET(500 MG) BY MOUTH TWICE DAILY WITH A MEAL 180 tablet 0  . Multiple Vitamin (MULTIVITAMIN WITH MINERALS) TABS tablet Take 1 tablet by mouth  daily.     . rosuvastatin (CRESTOR) 5 MG tablet TAKE 1 TABLET BY MOUTH EVERY DAY 30 tablet 0  . venlafaxine (EFFEXOR) 75 MG tablet Take 75 mg by mouth 2 (two) times daily with a meal.   1   Social History   Socioeconomic History  . Marital status: Married    Spouse name: Herbie [place]  . Number of children: 1  . Years of education: Not on file  . Highest education level: Not on file  Occupational History  . Occupation: disability  Tobacco Use  . Smoking status: Never Smoker  . Smokeless tobacco: Never Used  Vaping Use  . Vaping Use: Never used  Substance and Sexual Activity  . Alcohol use: No  . Drug use: No  . Sexual activity: Not Currently    Birth control/protection: Surgical  Other Topics Concern  . Not on file  Social History Narrative  . Not on file   Social Determinants of Health   Financial Resource Strain:   . Difficulty of Paying Living Expenses: Not on file  Food Insecurity:   . Worried About Charity fundraiser in the Last Year: Not on file  . Ran Out of Food in the Last Year: Not on file  Transportation Needs:   . Lack of Transportation (Medical): Not on file  . Lack of Transportation (Non-Medical): Not on file  Physical Activity:   . Days of Exercise per Week: Not on file  . Minutes of Exercise per Session: Not on file  Stress:   . Feeling of Stress : Not on file  Social Connections:   . Frequency of Communication with Friends and Family: Not on file  . Frequency of Social Gatherings with Friends and Family: Not on file  . Attends Religious Services: Not on file  . Active Member of Clubs or Organizations: Not on file  . Attends Archivist Meetings: Not on file  . Marital Status: Not on file  Intimate Partner Violence:   . Fear of Current or Ex-Partner: Not on file  . Emotionally Abused: Not on file  . Physically Abused: Not on file  . Sexually Abused: Not on file   Family History  Problem Relation Age of Onset  . Hypertension Father   .  Hyperlipidemia Father   . COPD Father   .  COPD Mother   . Heart disease Mother   . Cancer Mother        Cervix  . Anesthesia problems Mother        post-op N/V  . Thyroid disease Mother   . Diabetes Maternal Grandfather   . Thyroid disease Sister   . Anesthesia problems Sister        post-op N/V  . Thyroid disease Paternal Uncle   . Diabetes Other   . Colon cancer Neg Hx   . Colon polyps Neg Hx     OBJECTIVE:  Vitals:   09/28/19 1434  BP: (!) 158/84  Pulse: (!) 113  Resp: 20  Temp: 98.7 F (37.1 C)  SpO2: 96%    General appearance: ALERT; in no acute distress.  Head: NCAT Lungs: Normal respiratory effort; CTAB CV: RRR Musculoskeletal: Back  Inspection: Skin warm, dry, clear and intact without obvious erythema, effusion, or ecchymosis.  Palpation: mildly TTP over RT low back ROM: FROM active and passive Strength: 5/5 shld abduction, 5/5 shld adduction, 5/5 elbow flexion, 5/5 elbow extension, 5/5 grip strength, 5/5 hip flexion, 5/5 knee abduction, 5/5 knee adduction, 5/5 knee flexion, 5/5 knee extension Skin: warm and dry Neurologic: Ambulates with cane; Sensation intact about the upper/ lower extremities Psychological: alert and cooperative; normal mood and affect  ASSESSMENT & PLAN:  1. Acute right-sided low back pain with right-sided sciatica     Meds ordered this encounter  Medications  . predniSONE (STERAPRED UNI-PAK 21 TAB) 10 MG (21) TBPK tablet    Sig: Take by mouth daily. Take 6 tabs by mouth daily  for 2 days, then 5 tabs for 2 days, then 4 tabs for 2 days, then 3 tabs for 2 days, 2 tabs for 2 days, then 1 tab by mouth daily for 2 days    Dispense:  42 tablet    Refill:  0    Order Specific Question:   Supervising Provider    Answer:   Raylene Everts [[id number]]  . tizanidine (ZANAFLEX) 2 MG capsule    Sig: Take 1 capsule (2 mg total) by mouth 3 (three) times daily.    Dispense:  30 capsule    Refill:  0    Order Specific Question:   Supervising  Provider    Answer:   Raylene Everts [[id number]]    Continue conservative management of rest, ice, and gentle stretches Steroid shot given in office Prednisone prescribed.  Take as directed and to completion Take zanflex at nighttime for symptomatic relief. Avoid driving or operating heavy machinery while using medication. Follow up with PCP if symptoms persist Return or go to the ER if you have any new or worsening symptoms (fever, chills, chest pain, abdominal pain, changes in bowel or bladder habits, pain radiating into lower legs, etc...)    Reviewed expectations re: course of current medical issues. Questions answered. Outlined signs and symptoms indicating need for more acute intervention. Patient verbalized understanding. After Visit Summary given.    Lestine Box, PA-C 09/28/19 1448

## 2019-09-28 NOTE — ED Triage Notes (Signed)
Pt returns with continues right leg pain , resolved some with treatment but returned recently

## 2019-09-28 NOTE — Discharge Instructions (Signed)
Continue conservative management of rest, ice, and gentle stretches Steroid shot given in office Prednisone prescribed.  Take as directed and to completion Take zanflex at nighttime for symptomatic relief. Avoid driving or operating heavy machinery while using medication. Follow up with PCP if symptoms persist Return or go to the ER if you have any new or worsening symptoms (fever, chills, chest pain, abdominal pain, changes in bowel or bladder habits, pain radiating into lower legs, etc...)

## 2019-10-02 ENCOUNTER — Encounter: Payer: Self-pay | Admitting: Family Medicine

## 2019-10-02 ENCOUNTER — Other Ambulatory Visit: Payer: Self-pay | Admitting: *Deleted

## 2019-10-02 ENCOUNTER — Other Ambulatory Visit: Payer: Self-pay

## 2019-10-02 ENCOUNTER — Ambulatory Visit (INDEPENDENT_AMBULATORY_CARE_PROVIDER_SITE_OTHER): Payer: Medicare Other | Admitting: Family Medicine

## 2019-10-02 VITALS — BP 146/90 | HR 117 | Temp 97.6°F | Ht 63.0 in | Wt 189.0 lb

## 2019-10-02 DIAGNOSIS — M5441 Lumbago with sciatica, right side: Secondary | ICD-10-CM | POA: Diagnosis not present

## 2019-10-02 DIAGNOSIS — M545 Low back pain, unspecified: Secondary | ICD-10-CM

## 2019-10-02 LAB — COPPER, SERUM: Copper: 162 ug/dL — ABNORMAL HIGH (ref 80–158)

## 2019-10-02 NOTE — Patient Instructions (Signed)
We are trying to get you into your ortho specialist today or tomorrow.

## 2019-10-02 NOTE — Progress Notes (Addendum)
Patient ID: Carrie Cook, female    DOB: [birth year], 59 y.o.   MRN: [id number]   No chief complaint on file.  Subjective:    HPIfollow up on pinch nerve. Pain in low back and radiates down right leg. Taking tylenol for pain. Tried gabapentin with no relief and on prednisone currently. Stopped taking tizanidine due to it causing itching, rash and sob.    Medical History Nicie has a past medical history of Anemia, Anxiety, Asthma, Degenerative joint disease, Dental crowns present, Depression, Family history of anesthesia complication, Fibroids, Gastroesophageal reflux disease, H/O hiatal hernia, History of endometriosis, History of kidney stones, Hyperlipidemia, Hypertension, Insulin dependent diabetes mellitus, Lock jaw, Migraines, Neuropathy, Obesity, Palpitations, PONV (postoperative nausea and vomiting), Shortness of breath, and Sleep apnea.   Outpatient Encounter Medications as of 10/02/2019  Medication Sig  . acetaminophen (TYLENOL) 500 MG tablet Take 1 tablet (500 mg total) by mouth every 6 (six) hours as needed.  Marland Kitchen albuterol (PROVENTIL HFA;VENTOLIN HFA) 108 (90 Base) MCG/ACT inhaler Inhale 2 puffs into the lungs every 6 (six) hours as needed for wheezing.  Marland Kitchen amLODipine (NORVASC) 2.5 MG tablet TAKE 1 TABLET BY MOUTH DAILY  . Calcium Citrate 200 MG TABS Take 400 mg by mouth daily.  . cetirizine (ZYRTEC) 10 MG tablet Take 10 mg by mouth daily.  . ergocalciferol (VITAMIN D2) 1.25 MG (50000 UT) capsule Take 1 capsule (50,000 Units total) by mouth once a week. (Patient taking differently: Take 50,000 Units by mouth every Wednesday. )  . estradiol (ESTRACE) 2 MG tablet Take 1 tablet (2 mg total) by mouth at bedtime. (Patient taking differently: Take 2 mg by mouth every evening. )  . fluticasone (FLOVENT HFA) 220 MCG/ACT inhaler Inhale 2 puffs into the lungs 2 (two) times daily. (Patient taking differently: Inhale 2 puffs into the lungs daily as needed (Shortness of breath). )  . Insulin  Glargine (LANTUS SOLOSTAR) 100 UNIT/ML Solostar Pen Use 24 units qhs. May titrate to 30 units qhs (Patient taking differently: Inject 25 Units into the skin at bedtime. Use 24 units qhs. May titrate to 30 units qh)  . Insulin Pen Needle (PEN NEEDLES) 32G X 4 MM MISC 32 g by Does not apply route daily.  Marland Kitchen lamoTRIgine (LAMICTAL) 100 MG tablet Take 100 mg by mouth 2 (two) times daily.   Marland Kitchen losartan (COZAAR) 100 MG tablet TAKE 1 TABLET BY MOUTH DAILY  . Melatonin 3 MG SUBL Place 3 mg under the tongue at bedtime.   . metFORMIN (GLUCOPHAGE) 500 MG tablet TAKE 1 TABLET(500 MG) BY MOUTH TWICE DAILY WITH A MEAL  . Multiple Vitamin (MULTIVITAMIN WITH MINERALS) TABS tablet Take 1 tablet by mouth daily.   . predniSONE (STERAPRED UNI-PAK 21 TAB) 10 MG (21) TBPK tablet Take by mouth daily. Take 6 tabs by mouth daily  for 2 days, then 5 tabs for 2 days, then 4 tabs for 2 days, then 3 tabs for 2 days, 2 tabs for 2 days, then 1 tab by mouth daily for 2 days  . rosuvastatin (CRESTOR) 5 MG tablet TAKE 1 TABLET BY MOUTH EVERY DAY  . venlafaxine (EFFEXOR) 75 MG tablet Take 75 mg by mouth 2 (two) times daily with a meal.   . [DISCONTINUED] gabapentin (NEURONTIN) 300 MG capsule Take 1 capsule (300 mg total) by mouth 3 (three) times daily.  . [DISCONTINUED] tizanidine (ZANAFLEX) 2 MG capsule Take 1 capsule (2 mg total) by mouth 3 (three) times daily.   No facility-administered encounter  medications on file as of 10/02/2019.     Review of Systems  Constitutional: Negative.   HENT: Negative.   Respiratory: Negative.   Cardiovascular: Negative.   Musculoskeletal: Positive for back pain.       Had right knee surgery and rehab.   Neurological: Positive for numbness. Negative for weakness.     Vitals BP (!) 146/90   Pulse (!) 117   Temp 97.6 F (36.4 C)   Ht 5\' 3"  (1.6 m)   Wt 189 lb (85.7 kg)   LMP 07/10/2013   SpO2 98%   BMI 33.48 kg/m   Objective:   Physical Exam Constitutional:      Appearance: Normal  appearance.  Cardiovascular:     Rate and Rhythm: Normal rate and regular rhythm.     Heart sounds: Normal heart sounds.  Pulmonary:     Effort: Pulmonary effort is normal.     Breath sounds: Normal breath sounds.  Musculoskeletal:     Cervical back: Normal range of motion.     Comments: Right sided low back pain with radiating pain down right leg. Hx right knee surgery.   Skin:    General: Skin is warm and dry.  Neurological:     Mental Status: She is alert and oriented to person, place, and time.     Cranial Nerves: Cranial nerves are intact.     Sensory: Sensation is intact.     Motor: No weakness.     Coordination: Romberg sign negative. Coordination normal. Finger-Nose-Finger Test and Heel to [facility] Test normal.     Comments: Negative straight leg raise bilaterally. No red flags with detailed neuro exam in office today.      Assessment and Plan   1. Acute right-sided back pain with sciatica   Referral made to Dr. Arther Abbott who did her knee surgery to assess further ortho needs. Ideally this would be today or tomorrow.  Her neuro exam is negative. She is wondering if this is somehow related to the knee surgery.  She denies injury.  Agrees with plan of care discussed today. Understands warning signs to seek further care: weakness in any extremity. Understands to follow-up if symptoms do not improve or if anything changes.        10/02/2019

## 2019-10-03 ENCOUNTER — Inpatient Hospital Stay (HOSPITAL_COMMUNITY): Payer: Medicare Other | Admitting: Nurse Practitioner

## 2019-10-03 ENCOUNTER — Encounter: Payer: Self-pay | Admitting: Family Medicine

## 2019-10-06 ENCOUNTER — Other Ambulatory Visit: Payer: Self-pay

## 2019-10-06 ENCOUNTER — Encounter: Payer: Self-pay | Admitting: Family Medicine

## 2019-10-06 ENCOUNTER — Ambulatory Visit (INDEPENDENT_AMBULATORY_CARE_PROVIDER_SITE_OTHER): Payer: Medicare Other | Admitting: Family Medicine

## 2019-10-06 VITALS — BP 134/76 | HR 117 | Temp 97.4°F | Wt 186.2 lb

## 2019-10-06 DIAGNOSIS — E119 Type 2 diabetes mellitus without complications: Secondary | ICD-10-CM | POA: Diagnosis not present

## 2019-10-06 DIAGNOSIS — M5431 Sciatica, right side: Secondary | ICD-10-CM

## 2019-10-06 DIAGNOSIS — I10 Essential (primary) hypertension: Secondary | ICD-10-CM

## 2019-10-06 DIAGNOSIS — Z79899 Other long term (current) drug therapy: Secondary | ICD-10-CM

## 2019-10-06 DIAGNOSIS — Z794 Long term (current) use of insulin: Secondary | ICD-10-CM

## 2019-10-06 DIAGNOSIS — E7849 Other hyperlipidemia: Secondary | ICD-10-CM | POA: Diagnosis not present

## 2019-10-06 DIAGNOSIS — Z23 Encounter for immunization: Secondary | ICD-10-CM

## 2019-10-06 MED ORDER — LANTUS SOLOSTAR 100 UNIT/ML ~~LOC~~ SOPN: PEN_INJECTOR | SUBCUTANEOUS | 5 refills | Status: DC

## 2019-10-06 NOTE — Progress Notes (Signed)
   Subjective:    Patient ID: Carrie Cook, female    DOB: 1960-10-09, 60 y.o.   MRN: [id number]  Diabetes She presents for her follow-up diabetic visit. She has type 2 diabetes mellitus. There are no hypoglycemic associated symptoms. Pertinent negatives for hypoglycemia include no confusion or dizziness. There are no diabetic associated symptoms. Pertinent negatives for diabetes include no chest pain, no fatigue, no polydipsia, no polyphagia and no weakness. There are no hypoglycemic complications. There are no diabetic complications. She rarely participates in exercise. Home blood sugar record trend: Blood sugars have been eleveated due to being on Prednisone. She does not see a podiatrist.  Seen several times for back pain and sciatica issues No back history Have tried prednisone flexiril tylenol ibuprofen Not doing any stretchec    Review of Systems  Constitutional: Negative for activity change, appetite change and fatigue.  HENT: Negative for congestion and rhinorrhea.   Respiratory: Negative for cough and shortness of breath.   Cardiovascular: Negative for chest pain and leg swelling.  Gastrointestinal: Negative for abdominal pain and diarrhea.  Endocrine: Negative for polydipsia and polyphagia.  Musculoskeletal: Positive for back pain. Negative for arthralgias.  Skin: Negative for color change.  Neurological: Negative for dizziness and weakness.  Psychiatric/Behavioral: Negative for behavioral problems and confusion.       Objective:   Physical Exam Vitals reviewed.  Constitutional:      General: She is not in acute distress. HENT:     Head: Normocephalic.  Cardiovascular:     Rate and Rhythm: Normal rate and regular rhythm.     Heart sounds: Normal heart sounds. No murmur heard.   Pulmonary:     Effort: Pulmonary effort is normal.     Breath sounds: Normal breath sounds.  Lymphadenopathy:     Cervical: No cervical adenopathy.  Neurological:     Mental Status:  She is alert.  Psychiatric:        Behavior: Behavior normal.    Negative straight leg raise Strength in ankles feet overall good    Very nice patient Trying hard Having a difficult time with her sciatica    Assessment & Plan:  1. Right sciatic nerve pain Significant nerve impingement but no sign of muscle weakness May use OTC measures as needed if not dramatically better in the next 6 weeks recommend MRI currently right now MRI not necessary warning signs were discussed  2. Essential hypertension Blood pressure good control continue current medications watch diet check metabolic 7 - Lipid Profile - Basic Metabolic Panel (BMET) - Hemoglobin A1c - Hepatic function panel  3. Type 2 diabetes mellitus without complication, with long-term current use of insulin ([place]) Diabetes numbers have been up recently because of the steroids but patient will work hard on diet taking her medications and she will check an A1c again in early October follow-up here in 4 to 6 months - Lipid Profile - Basic Metabolic Panel (BMET) - Hemoglobin A1c - Hepatic function panel  4. Other hyperlipidemia Continue medication watch diet stay active - Lipid Profile - Basic Metabolic Panel (BMET) - Hemoglobin A1c - Hepatic function panel  5. High risk medication use See above - Lipid Profile - Basic Metabolic Panel (BMET) - Hemoglobin A1c - Hepatic function panel Morbid obesity patient encouraged to watch diet stay physically active

## 2019-10-09 ENCOUNTER — Ambulatory Visit (INDEPENDENT_AMBULATORY_CARE_PROVIDER_SITE_OTHER): Payer: Medicare Other | Admitting: Orthopedic Surgery

## 2019-10-09 ENCOUNTER — Encounter: Payer: Self-pay | Admitting: Orthopedic Surgery

## 2019-10-09 ENCOUNTER — Other Ambulatory Visit: Payer: Self-pay

## 2019-10-09 ENCOUNTER — Ambulatory Visit: Payer: Medicare Other

## 2019-10-09 VITALS — BP 171/125 | HR 116 | Ht 63.0 in | Wt 187.0 lb

## 2019-10-09 DIAGNOSIS — M545 Low back pain, unspecified: Secondary | ICD-10-CM

## 2019-10-09 DIAGNOSIS — M5410 Radiculopathy, site unspecified: Secondary | ICD-10-CM

## 2019-10-09 DIAGNOSIS — Z96651 Presence of right artificial knee joint: Secondary | ICD-10-CM

## 2019-10-09 MED ORDER — GABAPENTIN 300 MG PO CAPS: 300.0000 mg | ORAL_CAPSULE | Freq: Three times a day (TID) | ORAL | 5 refills | Status: DC

## 2019-10-09 MED ORDER — DIAZEPAM 5 MG PO TABS: 5.0000 mg | ORAL_TABLET | Freq: Four times a day (QID) | ORAL | 0 refills | Status: DC | PRN

## 2019-10-09 MED ORDER — INDOMETHACIN 25 MG PO CAPS: 25.0000 mg | ORAL_CAPSULE | Freq: Three times a day (TID) | ORAL | 1 refills | Status: DC

## 2019-10-09 NOTE — Progress Notes (Signed)
Progress Note //  new problem post op period (07-22-19 to 10-22-19)  Patient ID: Carrie Cook, female   DOB: [birth year], 59 y.o.   MRN: [id number]  Body mass index is 33.13 kg/m.  Chief Complaint  Patient presents with  . Back Pain    into right leg / buttock area   . Routine Post Op    07/22/19 knee replacement right/ can t do exercises without pain     Encounter Diagnoses  Name Primary?  . S/P total knee replacement, right 07/22/19    . Lumbar pain   . Radicular pain of right lower extremity Yes    59 year old female about 4 around 09/06/2019 started having acute onset of lower back pain perhaps after doing some work around the house went to urgent care got a Dosepak seem to get better went back to her normal activities pain came back she went to the urgent care again got another Dosepak which did help some however the patient have difficulty sleeping 7 continued pain running down the right leg continued severe lower back pain which is helped with ice versus heat      Review of Systems  Constitutional: Negative for chills, fever, malaise/fatigue and weight loss.  Gastrointestinal: Negative for constipation.       Denies loss bowel control   Genitourinary:       Denies urinary retention or los of bladder control       BP (!) 171/125   Pulse (!) 116   Ht 5\' 3"  (1.6 m)   Wt 187 lb (84.8 kg)   LMP 07/10/2013   BMI 33.13 kg/m   Physical Exam Constitutional:      General: She is not in acute distress.    Appearance: She is well-developed.  Cardiovascular:     Comments: No peripheral edema Musculoskeletal:     Lumbar back: Spasms and tenderness present. No deformity or lacerations. Decreased range of motion. Negative right straight leg raise test and negative left straight leg raise test. No scoliosis.  Skin:    General: Skin is warm and dry.     Capillary Refill: Capillary refill takes less than 2 seconds.  Neurological:     Mental Status: She is alert and oriented  to person, place, and time.     Sensory: No sensory deficit.     Motor: No weakness.     Coordination: Coordination normal.     Gait: Gait normal.     Deep Tendon Reflexes: Reflexes are normal and symmetric. Reflexes normal.  Psychiatric:        Mood and Affect: Mood normal.        Thought Content: Thought content normal.      MEDICAL DECISION MAKING Encounter Diagnoses  Name Primary?  . S/P total knee replacement, right 07/22/19    . Lumbar pain   . Radicular pain of right lower extremity Yes    DATA ANALYSED:  IMAGING: Independent interpretation of images: Internal imaging shows facet arthritis L3-S1 mild spinal malalignment secondary to muscle spasm  Outside records reviewed:  ER RECORDS   C. MANAGEMENT   59 year old female status post right total knee replacement started having lower back pain with radiation numbness and tingling into the right leg and foot she has been treated with IM shot to Dosepaks still complains of severe back pain which is rather disabling  Seems to have irritation of the right sciatic nerve  Recommend rest ice does better for her muscle relaxer nerve pain medication  NSAID  Follow-up 2 weeks  Meds ordered this encounter  Medications  . gabapentin (NEURONTIN) 300 MG capsule    Sig: Take 1 capsule (300 mg total) by mouth 3 (three) times daily.    Dispense:  90 capsule    Refill:  5  . indomethacin (INDOCIN) 25 MG capsule    Sig: Take 1 capsule (25 mg total) by mouth 3 (three) times daily with meals.    Dispense:  30 capsule    Refill:  1  . diazepam (VALIUM) 5 MG tablet    Sig: Take 1 tablet (5 mg total) by mouth every 6 (six) hours as needed.    Dispense:  30 tablet    Refill:  0    Arther Abbott, MD 10/09/2019 9:27 AM

## 2019-10-09 NOTE — Patient Instructions (Signed)
Acute Back Pain, Adult Acute back pain is sudden and usually short-lived. It is often caused by an injury to the muscles and tissues in the back. The injury may result from:  A muscle or ligament getting overstretched or torn (strained). Ligaments are tissues that connect bones to each other. Lifting something improperly can cause a back strain.  Wear and tear (degeneration) of the spinal disks. Spinal disks are circular tissue that provides cushioning between the bones of the spine (vertebrae).  Twisting motions, such as while playing sports or doing yard work.  A hit to the back.  Arthritis. You may have a physical exam, lab tests, and imaging tests to find the cause of your pain. Acute back pain usually goes away with rest and home care. Follow these instructions at home: Managing pain, stiffness, and swelling  Take over-the-counter and prescription medicines only as told by your health care provider.  Your health care provider may recommend applying ice during the first 24-48 hours after your pain starts. To do this: ? Put ice in a plastic bag. ? Place a towel between your skin and the bag. ? Leave the ice on for 20 minutes, 2-3 times a day.  If directed, apply heat to the affected area as often as told by your health care provider. Use the heat source that your health care provider recommends, such as a moist heat pack or a heating pad. ? Place a towel between your skin and the heat source. ? Leave the heat on for 20-30 minutes. ? Remove the heat if your skin turns bright red. This is especially important if you are unable to feel pain, heat, or cold. You have a greater risk of getting burned. Activity   Do not stay in bed. Staying in bed for more than 1-2 days can delay your recovery.  Sit up and stand up straight. Avoid leaning forward when you sit, or hunching over when you stand. ? If you work at a desk, sit close to it so you do not need to lean over. Keep your chin tucked  in. Keep your neck drawn back, and keep your elbows bent at a right angle. Your arms should look like the letter "L." ? Sit high and close to the steering wheel when you drive. Add lower back (lumbar) support to your car seat, if needed.  Take short walks on even surfaces as soon as you are able. Try to increase the length of time you walk each day.  Do not sit, drive, or stand in one place for more than 30 minutes at a time. Sitting or standing for long periods of time can put stress on your back.  Do not drive or use heavy machinery while taking prescription pain medicine.  Use proper lifting techniques. When you bend and lift, use positions that put less stress on your back: ? Bend your knees. ? Keep the load close to your body. ? Avoid twisting.  Exercise regularly as told by your health care provider. Exercising helps your back heal faster and helps prevent back injuries by keeping muscles strong and flexible.  Work with a physical therapist to make a safe exercise program, as recommended by your health care provider. Do any exercises as told by your physical therapist. Lifestyle  Maintain a healthy weight. Extra weight puts stress on your back and makes it difficult to have good posture.  Avoid activities or situations that make you feel anxious or stressed. Stress and anxiety increase muscle   tension and can make back pain worse. Learn ways to manage anxiety and stress, such as through exercise. General instructions  Sleep on a firm mattress in a comfortable position. Try lying on your side with your knees slightly bent. If you lie on your back, put a pillow under your knees.  Follow your treatment plan as told by your health care provider. This may include: ? Cognitive or behavioral therapy. ? Acupuncture or massage therapy. ? Meditation or yoga. Contact a health care provider if:  You have pain that is not relieved with rest or medicine.  You have increasing pain going down  into your legs or buttocks.  Your pain does not improve after 2 weeks.  You have pain at night.  You lose weight without trying.  You have a fever or chills. Get help right away if:  You develop new bowel or bladder control problems.  You have unusual weakness or numbness in your arms or legs.  You develop nausea or vomiting.  You develop abdominal pain.  You feel faint. Summary  Acute back pain is sudden and usually short-lived.  Use proper lifting techniques. When you bend and lift, use positions that put less stress on your back.  Take over-the-counter and prescription medicines and apply heat or ice as directed by your health care provider. This information is not intended to replace advice given to you by your health care provider. Make sure you discuss any questions you have with your health care provider. Document Revised: 04/30/2018 Document Reviewed: 08/23/2016 Elsevier Patient Education  2020 [employer] Inc.  

## 2019-10-22 ENCOUNTER — Ambulatory Visit: Payer: Medicare Other | Admitting: Orthopedic Surgery

## 2019-10-27 ENCOUNTER — Ambulatory Visit: Payer: Medicare Other | Admitting: Orthopedic Surgery

## 2019-10-30 ENCOUNTER — Other Ambulatory Visit: Payer: Self-pay | Admitting: Family Medicine

## 2019-11-05 ENCOUNTER — Other Ambulatory Visit: Payer: Self-pay

## 2019-11-05 ENCOUNTER — Ambulatory Visit (INDEPENDENT_AMBULATORY_CARE_PROVIDER_SITE_OTHER): Payer: Medicare Other | Admitting: Orthopedic Surgery

## 2019-11-05 ENCOUNTER — Encounter: Payer: Self-pay | Admitting: Orthopedic Surgery

## 2019-11-05 VITALS — BP 169/112 | HR 111 | Ht 63.0 in | Wt 189.0 lb

## 2019-11-05 DIAGNOSIS — M5410 Radiculopathy, site unspecified: Secondary | ICD-10-CM

## 2019-11-05 NOTE — Progress Notes (Signed)
Chief Complaint  Patient presents with  . Back Pain    back and leg pain     LAST TIME: 59 year old female status post right total knee replacement started having lower back pain with radiation numbness and tingling into the right leg and foot she has been treated with IM shot to Dosepaks still complains of severe back pain which is rather disabling   Seems to have irritation of the right sciatic nerve   Recommend rest ice does better for her muscle relaxer nerve pain medication NSAID   Follow-up 2 weeks       Meds ordered this encounter  Medications  . gabapentin (NEURONTIN) 300 MG capsule      Sig: Take 1 capsule (300 mg total) by mouth 3 (three) times daily.      Dispense:  90 capsule      Refill:  5  . indomethacin (INDOCIN) 25 MG capsule      Sig: Take 1 capsule (25 mg total) by mouth 3 (three) times daily with meals.      Dispense:  30 capsule      Refill:  1  . diazepam (VALIUM) 5 MG tablet      Sig: Take 1 tablet (5 mg total) by mouth every 6 (six) hours as needed.      Dispense:  30 tablet      Refill:  0   Serria has improved with less pain just has some soreness but she is concerned she may have a relapse as she did last time she was having similar symptoms  She is not taking the Valium she has taken the Indocin and gabapentin  We will continue with those and start physical therapy and then see her in follow-up.

## 2019-11-05 NOTE — Patient Instructions (Signed)
Continue gabapentin and indocin  Start therapy 2 x a week for 4 weeks

## 2019-11-06 ENCOUNTER — Other Ambulatory Visit: Payer: Self-pay | Admitting: Orthopedic Surgery

## 2019-11-06 DIAGNOSIS — M545 Low back pain, unspecified: Secondary | ICD-10-CM

## 2019-11-06 DIAGNOSIS — M5410 Radiculopathy, site unspecified: Secondary | ICD-10-CM

## 2019-11-12 DIAGNOSIS — F339 Major depressive disorder, recurrent, unspecified: Secondary | ICD-10-CM | POA: Diagnosis not present

## 2019-11-17 ENCOUNTER — Telehealth: Payer: Self-pay | Admitting: Radiology

## 2019-11-17 DIAGNOSIS — M545 Low back pain, unspecified: Secondary | ICD-10-CM

## 2019-11-17 NOTE — Telephone Encounter (Signed)
Veverly Fells, Laira Penninger W, RT Finlee Milo,  Patient called - said that [facility] initially told her that "we did not place the correct body part" on the PT order. Order has since been closed. Patient said it is for her back. Please advise as she has had been able to have any therapy as of yet.   Yes, when Inez Catalina put in order it was associated with incorrect body part and the order was also incorrect  I have corrected order and sent to therapy.

## 2019-11-24 ENCOUNTER — Other Ambulatory Visit: Payer: Self-pay

## 2019-11-24 ENCOUNTER — Ambulatory Visit (HOSPITAL_COMMUNITY): Payer: Medicare Other | Attending: Orthopedic Surgery | Admitting: Physical Therapy

## 2019-11-24 DIAGNOSIS — M545 Low back pain, unspecified: Secondary | ICD-10-CM | POA: Diagnosis not present

## 2019-11-24 DIAGNOSIS — R293 Abnormal posture: Secondary | ICD-10-CM | POA: Diagnosis not present

## 2019-11-24 DIAGNOSIS — R2689 Other abnormalities of gait and mobility: Secondary | ICD-10-CM | POA: Insufficient documentation

## 2019-11-24 NOTE — Therapy (Signed)
[place], [place], [address] Phone: [phone]   Fax:  [phone]  Physical Therapy Evaluation  Patient Details  Name: Carrie Cook MRN: [id number] Date of Birth: [birth year] Referring Provider (PT): Arther Abbott, MD   Encounter Date: 11/24/2019   PT End of Session - 11/24/19 0946    Visit Number 1    Number of Visits 18    Date for PT Re-Evaluation 01/09/20    Authorization Type Medicare Part A    Progress Note Due on Visit 10    PT Start Time 0905    PT Stop Time 0945    PT Time Calculation (min) 40 min    Activity Tolerance Patient tolerated treatment well    Behavior During Therapy [facility] for tasks assessed/performed           Past Medical History:  Diagnosis Date  . Anemia    PT HAS HAD COLONOSCOPY AND ENDOSCOPY WORK UP - NO PROBLEMS FOUND AS SOURCE OF ANEMIA -- PT HAD IRON INFUSION AT [facility] 11/13/12-AFTER SEEING HEMATOLOGIST DR. [place] AND HE GAVE HEMATOLOGIC CLEARANCE FOR GASTRIC BYPASS SURGERY.  Marland Kitchen Anxiety   . Asthma    daily and prn inhalers  . Degenerative joint disease    right knee, spine - STATES INTERMITTENT  NUMBNESS DOWN RT LEG WITH PROLONGED STANDING OR WALKING- THINKS RELATED TO HER SPINE PROBLEMS  . Dental crowns present   . Depression   . Family history of anesthesia complication    pt's mother and sister have hx. of post-op N/V  . Fibroids    UTERINE  . Gastroesophageal reflux disease    none for 2 years since Bariatric surgery.  . H/O hiatal hernia   . History of endometriosis   . History of kidney stones   . Hyperlipidemia   . Hypertension    under control with med., has been on med. x 2 yr.  . Insulin dependent diabetes mellitus   . Lock jaw    jaw locks open if opens mouth wide  . Migraines   . Neuropathy    FEET  . Obesity   . Palpitations   . PONV (postoperative nausea and vomiting)   . Shortness of breath    with daily activities  . Sleep apnea     post-op didn't need CPAP but now states needs it again but not using    Past Surgical History:  Procedure Laterality Date  . ABDOMINAL HYSTERECTOMY N/A 06/06/2017   Procedure: HYSTERECTOMY ABDOMINAL;  Surgeon: Florian Buff, MD;  Location: [facility];  Service: Gynecology;  Laterality: N/A;  . BREATH TEK H PYLORI N/A 08/13/2012   Procedure: BREATH TEK H PYLORI;  Surgeon: Edward Jolly, MD;  Location: [facility];  Service: General;  Laterality: N/A;  . CARPAL TUNNEL RELEASE  01/03/2012   Procedure: CARPAL TUNNEL RELEASE;  Surgeon: Wynonia Sours, MD;  Location: [place];  Service: Orthopedics;  Laterality: Right;  . carpal tunnel release right hand Right 12/13  . COLONOSCOPY  05/2009   PIR:JJOACZYS hemorrhoids otherwise normal colon, rectum and terminal ileum  . COLONOSCOPY WITH ESOPHAGOGASTRODUODENOSCOPY (EGD) N/A 10/28/2012   Procedure: COLONOSCOPY WITH ESOPHAGOGASTRODUODENOSCOPY (EGD);  Surgeon: Daneil Dolin, MD;  [facility];  Service: Endoscopy;  Laterality: N/A;  7:30  . DILITATION & CURRETTAGE/HYSTROSCOPY WITH NOVASURE ABLATION N/A 07/22/2014   Procedure: DILATATION & CURETTAGE/HYSTEROSCOPY WITH NOVASURE ABLATION; uterine length 6.0 cm; uterine width 3.8 cm; total  ablation time 1 minute 15 seconds;  Surgeon: Florian Buff, MD;  Location: [facility];  Service: Gynecology;  Laterality: N/A;  . ESOPHAGOGASTRODUODENOSCOPY  5/013/2011   QVZ:DGLOVF esophagus/multiple polyps removed s/p (hyperplastic). Gastritis without H.pylori.  . ESOPHAGOGASTRODUODENOSCOPY (EGD) WITH PROPOFOL N/A 06/06/2018   with LA Grade A esophagitis s/p dilation.   Marland Kitchen GASTRIC ROUX-EN-Y N/A 11/19/2012   Procedure: LAPAROSCOPIC ROUX-EN-Y GASTRIC;  Surgeon: Edward Jolly, MD;  Location: [facility];  Service: General;  Laterality: N/A;  . GIVENS CAPSULE STUDY N/A 10/28/2012   Procedure: GIVENS CAPSULE STUDY;  Surgeon: Daneil Dolin, MD;  [facility];  Service: Endoscopy;  Laterality:  N/A;  . KNEE ARTHROSCOPY WITH MEDIAL MENISECTOMY Right 10/28/2015   Procedure: RIGHT KNEE ARTHROSCOPY WITH MEDIAL MENISECTOMY;  Surgeon: Carole Civil, MD;  Location[facility];  Service: Orthopedics;  Laterality: Right;  . LAPAROSCOPY     due to endometriosis  . MALONEY DILATION N/A 06/06/2018   Procedure: Venia Minks DILATION;  Surgeon: Daneil Dolin, MD;  [facility];  Service: Endoscopy;  Laterality: N/A;  . PELVIC LAPAROSCOPY  11/04/1999   with fulguration of endometriosis  . SALPINGOOPHORECTOMY Bilateral 06/06/2017   Procedure: SALPINGO OOPHORECTOMY;  Surgeon: Florian Buff, MD;  Location: [facility];  Service: Gynecology;  Laterality: Bilateral;  . TOTAL KNEE ARTHROPLASTY Right 07/22/2019   Procedure: TOTAL KNEE ARTHROPLASTY;  Surgeon: Carole Civil, MD;  Location: [facility];  Service: Orthopedics;  Laterality: Right;  . TRIGGER FINGER RELEASE Right 09/24/2012   Procedure: RELEASE A-1 PULLEY OF RIGHT THUMB;  Surgeon: Wynonia Sours, MD;  Location: [facility];  Service: Orthopedics;  Laterality: Right;  . TUBAL LIGATION  1993    There were no vitals filed for this visit.    Subjective Assessment - 11/24/19 0915    Subjective Patient presents to physical therapy with complaint of lumbar pain. Patient has previously been seen at this clinic for her knee. She says this has been doing great. Patient says shortly after being DC form therapy for her knee, she was cleared for full activity. Says the thinks she may have overdid it. Says she woke up with pain in lumbar, cannot pinpoint what may have caused this. Says she also had some pain that went down into RT foot also. Patient had recent xrays which showed "lumbar degenerative disc changes L3-4-5 and S1 all facet joints are arthritic there is no significant joint space". Says she was prescribed steroids, indomethacin and is taking Tylenol PRN. Says she is doing much better now. Pain is not as bad. Still having occasional  pains and numbness in RLE.    Pertinent History RT TKA    Limitations Standing;Lifting;Walking;House hold activities    Diagnostic tests xrays    Patient Stated Goals decrease pain    Currently in Pain? Yes    Pain Score 2     Pain Location Back    Pain Orientation Right;Posterior    Pain Descriptors / Indicators Sore;Cramping;Burning    Pain Type Acute pain    Pain Radiating Towards RT foot    Pain Onset More than a month ago    Pain Frequency Constant    Aggravating Factors  prolonged sitting, standing, laying on RT side, walking far distance    Pain Relieving Factors Meds, laying on back    Effect of Pain on Daily Activities Limits              [facility] PT Assessment -  11/24/19 0001      Assessment   Medical Diagnosis LBP     Referring Provider (PT) Arther Abbott, MD    Onset Date/Surgical Date 09/09/19    Next MD Visit 12/03/19    Prior Therapy Yes for knee       Precautions   Precautions None      Restrictions   Weight Bearing Restrictions No      Balance Screen   Has the patient fallen in the past 6 months No      [place] residence      Prior Function   Level of Independence Independent with household mobility with device      Cognition   Overall Cognitive Status Within Functional Limits for tasks assessed      Observation/Other Assessments   Focus on Therapeutic Outcomes (FOTO)  62% limited       Posture/Postural Control   Posture/Postural Control Postural limitations    Postural Limitations Decreased lumbar lordosis      AROM   AROM Assessment Site Lumbar    Lumbar Flexion WFL    with increased pain    Lumbar Extension --   WFL    Lumbar - Right Side Bend WFL    pain    Lumbar - Left Side Bend WFL     Lumbar - Right Rotation WFL     Lumbar - Left Rotation 10% limited       Strength   Right Hip Flexion 5/5    Right Hip ABduction 4/5    Left Hip Flexion 5/5    Left Hip ABduction 4+/5    Right Knee  Extension 5/5    Left Knee Extension 5/5      Palpation   Palpation comment no notable TTP       Special Tests    Special Tests --   (-) sacral compression                      Objective measurements completed on examination: See above findings.       [facility] Adult PT Treatment/Exercise - 11/24/19 0001      Exercises   Exercises Lumbar      Lumbar Exercises: Stretches   Piriformis Stretch Right;1 rep;30 seconds      Lumbar Exercises: Supine   Ab Set 5 reps    Bridge 5 reps                  PT Education - 11/24/19 [phone]    Education Details on evaluation findings, POC and HEP    Person(s) Educated Patient    Methods Explanation    Comprehension Verbalized understanding            PT Short Term Goals - 11/24/19 1019      PT SHORT TERM GOAL #1   Title Patient will be independent with initial HEP and self-management strategies to improve functional outcomes    Time 3    Period Weeks    Status New    Target Date 12/19/19             PT Long Term Goals - 11/24/19 1019      PT LONG TERM GOAL #1   Title Patient will improve FOTO score by at least 10% to indicate improvement in functional outcomes    Time 6    Period Weeks    Status New    Target Date 01/09/20  PT LONG TERM GOAL #2   Title Patient will report at least 75% overall improvement in subjective complaint to indicate improvement in ability to perform ADLs.    Time 6    Period Weeks    Status New    Target Date 01/09/20      PT LONG TERM GOAL #3   Title Patient will have equal to or > 4+/5 MMT throughout BLE to improve ability to perform functional mobility, stair ambulation and ADLs.    Time 6    Period Weeks    Status New    Target Date 01/09/20      PT LONG TERM GOAL #4   Title Patient will be independent with initial HEP and self-management strategies to improve functional outcomes    Time 6    Period Weeks    Status New    Target Date 01/09/20                   Plan - 11/24/19 1015    Clinical Impression Statement Patient is a 59 y.o. female who presents to physical therapy with complaint of LBP. Patient demonstrates decreased strength, ROM restriction, and postural abnormalities which are likely contributing to symptoms of pain and are negatively impacting patient ability to perform ADLs and functional mobility tasks. Patient will benefit from skilled physical therapy services to address these deficits to reduce pain and improve level of function with ADLs and functional mobility tasks.    Examination-Activity Limitations Locomotion Level;Transfers;Squat;Bend;Sleep    Examination-Participation Restrictions Community Activity;Valla Leaver [facility]    Stability/Clinical Decision Making Stable/Uncomplicated    Clinical Decision Making Low    Rehab Potential Good    PT Frequency 3x / week    PT Duration 6 weeks    PT Treatment/Interventions ADLs/Self Care Home Management;Aquatic Therapy;Biofeedback;Cryotherapy;Electrical Stimulation;Moist Heat;DME Instruction;Gait training;Stair training;Functional mobility training;Therapeutic activities;Therapeutic exercise;Balance training;Neuromuscular re-education;Patient/family education;Manual techniques;Scar mobilization;Passive range of motion;Dry needling;Taping;Joint Manipulations    PT Next Visit Plan Review goals and HEP. Initiate ther ex program Focus on core and glute strengthening for improved lumbar stabilization. Add manuals to address pain and restricitons as needed.    PT Home Exercise Plan 11/24/19: ab set, bridge, piriformis stretch    Consulted and Agree with Plan of Care Patient           Patient will benefit from skilled therapeutic intervention in order to improve the following deficits and impairments:  Postural dysfunction, Pain, Decreased activity tolerance, Decreased strength, Impaired perceived functional ability, Improper body mechanics  Visit Diagnosis: Low back pain,  unspecified back pain laterality, unspecified chronicity, unspecified whether sciatica present  Abnormal posture     Problem List Patient Active Problem List   Diagnosis Date Noted  . Acute right-sided back pain with sciatica 10/02/2019  . S/P total knee replacement, right 07/22/19  09/01/2019  . Primary localized osteoarthritis of left knee 07/22/2019  . Primary osteoarthritis of right knee   . Constipation 09/05/2018  . Abdominal pain, chronic, epigastric 05/27/2018  . Dysphagia 05/27/2018  . Depression, major, single episode, moderate ([place]) 03/22/2018  . S/P complete hysterectomy 06/06/2017  . Derangement of posterior horn of medial meniscus of right knee   . Right knee pain 09/10/2015  . Iron deficiency anemia 10/16/2013  . Fibroids 08/21/2013  . Excessive or frequent menstruation 06/19/2013  . Morbid obesity ([place]) 07/12/2012  . Obstructive sleep apnea 06/21/2012  . Chest pain   . Hyperlipidemia   . Type 2 diabetes mellitus ([place])   . Obesity   .  Laboratory test   . Nephrolithiasis   . Asthma   . Endometriosis   . Hypertension   . Iron deficiency anemia 05/11/2009  . GERD 05/11/2009   10:24 AM, 11/24/19 Josue Hector PT DPT  Physical Therapist with [facility]  [phone] [place] [address] [place], [place], [address] Phone: [phone]   Fax:  [phone]  Name: Carrie Cook MRN: [id number] Date of Birth: [birth year]

## 2019-11-24 NOTE — Patient Instructions (Signed)
Access Code: [email or web address] URL: [email or web address] Date: 11/24/2019 Prepared by: Josue Hector  Exercises Supine Transversus Abdominis Bracing - Hands on Stomach - 2-3 x daily - 7 x weekly - 2 sets - 10 reps - 5 second hold Supine Bridge - 2-3 x daily - 7 x weekly - 2 sets - 10 reps - 5 second hold Supine Piriformis Stretch with Foot on Ground - 2-3 x daily - 7 x weekly - 1 sets - 3 reps - 30 seconds hold

## 2019-11-27 ENCOUNTER — Ambulatory Visit (HOSPITAL_COMMUNITY): Payer: Medicare Other | Admitting: Physical Therapy

## 2019-11-27 ENCOUNTER — Telehealth (HOSPITAL_COMMUNITY): Payer: Self-pay | Admitting: Physical Therapy

## 2019-11-27 NOTE — Telephone Encounter (Signed)
pt called to cx this appt due to she is not feeling well 

## 2019-11-28 ENCOUNTER — Other Ambulatory Visit: Payer: Self-pay | Admitting: Family Medicine

## 2019-12-03 ENCOUNTER — Other Ambulatory Visit: Payer: Self-pay

## 2019-12-03 ENCOUNTER — Ambulatory Visit (INDEPENDENT_AMBULATORY_CARE_PROVIDER_SITE_OTHER): Payer: Medicare Other | Admitting: Orthopedic Surgery

## 2019-12-03 ENCOUNTER — Encounter: Payer: Self-pay | Admitting: Orthopedic Surgery

## 2019-12-03 ENCOUNTER — Encounter (HOSPITAL_COMMUNITY): Payer: Self-pay | Admitting: Physical Therapy

## 2019-12-03 ENCOUNTER — Ambulatory Visit (HOSPITAL_COMMUNITY): Payer: Medicare Other | Admitting: Physical Therapy

## 2019-12-03 VITALS — BP 153/95 | HR 109 | Ht 63.0 in | Wt 193.0 lb

## 2019-12-03 DIAGNOSIS — M5410 Radiculopathy, site unspecified: Secondary | ICD-10-CM

## 2019-12-03 DIAGNOSIS — M545 Low back pain, unspecified: Secondary | ICD-10-CM

## 2019-12-03 DIAGNOSIS — R293 Abnormal posture: Secondary | ICD-10-CM

## 2019-12-03 NOTE — Progress Notes (Signed)
Chief Complaint  Patient presents with  . Back Pain    has only had a couple visits of therapy not noticed anything yet    59 year old female had a right total knee in June 2021 that is functioning well still having some leg numbness but managing well with Tylenol and gabapentin she did take the Indocin but ran out of it.  She is only had 2 physical therapy visits so not much difference yet  Recommend continuing with the Tylenol and gabapentin stop the Indocin and continue to therapy follow-up in 5 weeks  Encounter Diagnosis  Name Primary?  . Radicular pain of right lower extremity Yes

## 2019-12-03 NOTE — Therapy (Signed)
[place], [place], [address] Phone: [phone]   Fax:  [phone]  Physical Therapy Treatment  Patient Details  Name: Carrie Cook MRN: [id number] Date of Birth: 07-May-1960 Referring Provider (PT): Arther Abbott, MD   Encounter Date: 12/03/2019   PT End of Session - 12/03/19 1001    Visit Number 2    Number of Visits 18    Date for PT Re-Evaluation 01/09/20    Authorization Type Medicare Part A    Progress Note Due on Visit 10    PT Start Time 1003    PT Stop Time 1041    PT Time Calculation (min) 38 min    Activity Tolerance Patient tolerated treatment well    Behavior During Therapy [facility] for tasks assessed/performed           Past Medical History:  Diagnosis Date  . Anemia    PT HAS HAD COLONOSCOPY AND ENDOSCOPY WORK UP - NO PROBLEMS FOUND AS SOURCE OF ANEMIA -- PT HAD IRON INFUSION AT [facility] 11/13/12-AFTER SEEING HEMATOLOGIST DR. [place] AND HE GAVE HEMATOLOGIC CLEARANCE FOR GASTRIC BYPASS SURGERY.  Marland Kitchen Anxiety   . Asthma    daily and prn inhalers  . Degenerative joint disease    right knee, spine - STATES INTERMITTENT  NUMBNESS DOWN RT LEG WITH PROLONGED STANDING OR WALKING- THINKS RELATED TO HER SPINE PROBLEMS  . Dental crowns present   . Depression   . Family history of anesthesia complication    pt's mother and sister have hx. of post-op N/V  . Fibroids    UTERINE  . Gastroesophageal reflux disease    none for 2 years since Bariatric surgery.  . H/O hiatal hernia   . History of endometriosis   . History of kidney stones   . Hyperlipidemia   . Hypertension    under control with med., has been on med. x 2 yr.  . Insulin dependent diabetes mellitus   . Lock jaw    jaw locks open if opens mouth wide  . Migraines   . Neuropathy    FEET  . Obesity   . Palpitations   . PONV (postoperative nausea and vomiting)   . Shortness of breath    with daily activities  . Sleep apnea     post-op didn't need CPAP but now states needs it again but not using    Past Surgical History:  Procedure Laterality Date  . ABDOMINAL HYSTERECTOMY N/A 06/06/2017   Procedure: HYSTERECTOMY ABDOMINAL;  Surgeon: Florian Buff, MD;  [facility];  Service: Gynecology;  Laterality: N/A;  . BREATH TEK H PYLORI N/A 08/13/2012   Procedure: BREATH TEK H PYLORI;  Surgeon: Edward Jolly, MD;  Location: [facility];  Service: General;  Laterality: N/A;  . CARPAL TUNNEL RELEASE  01/03/2012   Procedure: CARPAL TUNNEL RELEASE;  Surgeon: Wynonia Sours, MD;  Location: [facility];  Service: Orthopedics;  Laterality: Right;  . carpal tunnel release right hand Right 12/13  . COLONOSCOPY  05/2009   FFM:[id number] hemorrhoids otherwise normal colon, rectum and terminal ileum  . COLONOSCOPY WITH ESOPHAGOGASTRODUODENOSCOPY (EGD) N/A 10/28/2012   Procedure: COLONOSCOPY WITH ESOPHAGOGASTRODUODENOSCOPY (EGD);  Surgeon: Daneil Dolin, MD;  [facility];  Service: Endoscopy;  Laterality: N/A;  7:30  . DILITATION & CURRETTAGE/HYSTROSCOPY WITH NOVASURE ABLATION N/A 07/22/2014   Procedure: DILATATION & CURETTAGE/HYSTEROSCOPY WITH NOVASURE ABLATION; uterine length 6.0 cm; uterine width 3.8 cm; total  ablation time 1 minute 15 seconds;  Surgeon: Florian Buff, MD;  [facility];  Service: Gynecology;  Laterality: N/A;  . ESOPHAGOGASTRODUODENOSCOPY  5/013/2011   VWU:[id number] esophagus/multiple polyps removed s/p (hyperplastic). Gastritis without H.pylori.  . ESOPHAGOGASTRODUODENOSCOPY (EGD) WITH PROPOFOL N/A 06/06/2018   with LA Grade A esophagitis s/p dilation.   Marland Kitchen GASTRIC ROUX-EN-Y N/A 11/19/2012   Procedure: LAPAROSCOPIC ROUX-EN-Y GASTRIC;  Surgeon: Edward Jolly, MD;  [facility];  Service: General;  Laterality: N/A;  . GIVENS CAPSULE STUDY N/A 10/28/2012   Procedure: GIVENS CAPSULE STUDY;  Surgeon: Daneil Dolin, MD;  [facility];  Service: Endoscopy;  Laterality:  N/A;  . KNEE ARTHROSCOPY WITH MEDIAL MENISECTOMY Right 10/28/2015   Procedure: RIGHT KNEE ARTHROSCOPY WITH MEDIAL MENISECTOMY;  Surgeon: Carole Civil, MD;  [facility];  Service: Orthopedics;  Laterality: Right;  . LAPAROSCOPY     due to endometriosis  . MALONEY DILATION N/A 06/06/2018   Procedure: Venia Minks DILATION;  Surgeon: Daneil Dolin, MD;  [facility];  Service: Endoscopy;  Laterality: N/A;  . PELVIC LAPAROSCOPY  11/04/1999   with fulguration of endometriosis  . SALPINGOOPHORECTOMY Bilateral 06/06/2017   Procedure: SALPINGO OOPHORECTOMY;  Surgeon: Florian Buff, MD;  [facility];  Service: Gynecology;  Laterality: Bilateral;  . TOTAL KNEE ARTHROPLASTY Right 07/22/2019   Procedure: TOTAL KNEE ARTHROPLASTY;  Surgeon: Carole Civil, MD;  [facility];  Service: Orthopedics;  Laterality: Right;  . TRIGGER FINGER RELEASE Right 09/24/2012   Procedure: RELEASE A-1 PULLEY OF RIGHT THUMB;  Surgeon: Wynonia Sours, MD;  Location: [facility];  Service: Orthopedics;  Laterality: Right;  . TUBAL LIGATION  1993    There were no vitals filed for this visit.   Subjective Assessment - 12/03/19 1002    Subjective Patient states her back is about the same. Her father passed away so her back has been aggravated because she has been lifting a lot. She has been doing exercises some about 1x/day. She is having back pain down to her knee on R and in the back of her leg in the left leg.    Pertinent History RT TKA    Limitations Standing;Lifting;Walking;House hold activities    Diagnostic tests xrays    Patient Stated Goals decrease pain    Currently in Pain? Yes    Pain Score 3     Pain Location Back    Pain Descriptors / Indicators Burning;Sore    Pain Onset More than a month ago                             [facility] Adult PT Treatment/Exercise - 12/03/19 0001      Lumbar Exercises: Stretches   Standing Extension 10 reps    Standing  Extension Limitations 2 sets    Prone on Elbows Stretch 10 seconds;5 reps    Press Ups 10 reps    Press Ups Limitations 2 second holds,     Piriformis Stretch Right;Left;2 reps;30 seconds      Lumbar Exercises: Supine   Ab Set 5 reps;5 seconds    Bridge 10 reps    Bridge Limitations with glute set    Other Supine Lumbar Exercises glute sets 5x 5 second holds                  PT Education - 12/03/19 1001    Education Details Patient  educated on HEP, exercise mechanics, lumbar anatomy/pathology, dosing of exercises    Person(s) Educated Patient    Methods Explanation;Demonstration;Handout    Comprehension Verbalized understanding;Returned demonstration            PT Short Term Goals - 11/24/19 1019      PT SHORT TERM GOAL #1   Title Patient will be independent with initial HEP and self-management strategies to improve functional outcomes    Time 3    Period Weeks    Status New    Target Date 12/19/19             PT Long Term Goals - 11/24/19 1019      PT LONG TERM GOAL #1   Title Patient will improve FOTO score by at least 10% to indicate improvement in functional outcomes    Time 6    Period Weeks    Status New    Target Date 01/09/20      PT LONG TERM GOAL #2   Title Patient will report at least 75% overall improvement in subjective complaint to indicate improvement in ability to perform ADLs.    Time 6    Period Weeks    Status New    Target Date 01/09/20      PT LONG TERM GOAL #3   Title Patient will have equal to or > 4+/5 MMT throughout BLE to improve ability to perform functional mobility, stair ambulation and ADLs.    Time 6    Period Weeks    Status New    Target Date 01/09/20      PT LONG TERM GOAL #4   Title Patient will be independent with initial HEP and self-management strategies to improve functional outcomes    Time 6    Period Weeks    Status New    Target Date 01/09/20                 Plan - 12/03/19 1001     Clinical Impression Statement Patient educated on probable muscle soreness in hamstrings due to a lot of bending/lifting lately putting extra tension on hamstrings. Patient able to complete prior exercises with min verbal cueing. Patient feeling decrease in symptoms after initial exercises. Patient states POE feels good in low back while completing. Patient states decrease in radicular symptoms during prone press ups. Patient having decreased symptoms following press ups in RLE. Patient educated on anatomy and lumbar kinematics. Patient states back feeling better following standing extensions, and decreased symptoms in RLE following as well. Patient educated on dosing, frequency of exercises, and centralization vs. peripheralization of symptoms. She feels centralized symptoms at end of session with reduction of RLE symptoms. Patient will continue to benefit from skilled physical therapy in order to reduce impairment and improve function.    Examination-Activity Limitations Locomotion Level;Transfers;Squat;Bend;Sleep    Examination-Participation Restrictions Community Activity;[facility]    Stability/Clinical Decision Making Stable/Uncomplicated    Rehab Potential Good    PT Frequency 3x / week    PT Duration 6 weeks    PT Treatment/Interventions ADLs/Self Care Home Management;Aquatic Therapy;Biofeedback;Cryotherapy;Electrical Stimulation;Moist Heat;DME Instruction;Gait training;Stair training;Functional mobility training;Therapeutic activities;Therapeutic exercise;Balance training;Neuromuscular re-education;Patient/family education;Manual techniques;Scar mobilization;Passive range of motion;Dry needling;Taping;Joint Manipulations    PT Next Visit Plan f/u with extension based exercises,  Focus on core and glute strengthening for improved lumbar stabilization. Add manuals to address pain and restricitons as needed.    PT Home Exercise Plan 11/24/19: ab set, bridge, piriformis stretch 11/10 POE, press  up, standing ext    Consulted and Agree with Plan of Care Patient           Patient will benefit from skilled therapeutic intervention in order to improve the following deficits and impairments:  Postural dysfunction, Pain, Decreased activity tolerance, Decreased strength, Impaired perceived functional ability, Improper body mechanics  Visit Diagnosis: Low back pain, unspecified back pain laterality, unspecified chronicity, unspecified whether sciatica present  Abnormal posture     Problem List Patient Active Problem List   Diagnosis Date Noted  . Acute right-sided back pain with sciatica 10/02/2019  . S/P total knee replacement, right 07/22/19  09/01/2019  . Primary localized osteoarthritis of left knee 07/22/2019  . Primary osteoarthritis of right knee   . Constipation 09/05/2018  . Abdominal pain, chronic, epigastric 05/27/2018  . Dysphagia 05/27/2018  . Depression, major, single episode, moderate ([place]) 03/22/2018  . S/P complete hysterectomy 06/06/2017  . Derangement of posterior horn of medial meniscus of right knee   . Right knee pain 09/10/2015  . Iron deficiency anemia 10/16/2013  . Fibroids 08/21/2013  . Excessive or frequent menstruation 06/19/2013  . Morbid obesity ([place]) 07/12/2012  . Obstructive sleep apnea 06/21/2012  . Chest pain   . Hyperlipidemia   . Type 2 diabetes mellitus ([place])   . Obesity   . Laboratory test   . Nephrolithiasis   . Asthma   . Endometriosis   . Hypertension   . Iron deficiency anemia 05/11/2009  . GERD 05/11/2009   10:48 AM, 12/03/19 Mearl Latin PT, DPT Physical Therapist at [place], [place], [address] Phone: [phone]   Fax:  [phone]  Name: Carrie Cook MRN: [id number] Date of Birth: 03-Feb-1960

## 2019-12-05 ENCOUNTER — Ambulatory Visit (HOSPITAL_COMMUNITY): Payer: Medicare Other | Admitting: Physical Therapy

## 2019-12-05 ENCOUNTER — Other Ambulatory Visit: Payer: Self-pay

## 2019-12-05 DIAGNOSIS — R293 Abnormal posture: Secondary | ICD-10-CM

## 2019-12-05 DIAGNOSIS — M545 Low back pain, unspecified: Secondary | ICD-10-CM

## 2019-12-05 NOTE — Therapy (Signed)
[place], [place], [address] Phone: [phone]   Fax:  [phone]  Physical Therapy Treatment  Patient Details  Name: Carrie Cook MRN: [id number] Date of Birth: Feb 24, 1960 Referring Provider (PT): Arther Abbott, MD   Encounter Date: 12/05/2019   PT End of Session - 12/05/19 1159    Visit Number 3    Number of Visits 18    Date for PT Re-Evaluation 01/09/20    Authorization Type Medicare Part A    Progress Note Due on Visit 10    PT Start Time 1130    PT Stop Time 1215    PT Time Calculation (min) 45 min    Activity Tolerance Patient tolerated treatment well    Behavior During Therapy [facility] for tasks assessed/performed           Past Medical History:  Diagnosis Date  . Anemia    PT HAS HAD COLONOSCOPY AND ENDOSCOPY WORK UP - NO PROBLEMS FOUND AS SOURCE OF ANEMIA -- PT HAD IRON INFUSION AT [facility] 11/13/12-AFTER SEEING HEMATOLOGIST DR. [place] AND HE GAVE HEMATOLOGIC CLEARANCE FOR GASTRIC BYPASS SURGERY.  Marland Kitchen Anxiety   . Asthma    daily and prn inhalers  . Degenerative joint disease    right knee, spine - STATES INTERMITTENT  NUMBNESS DOWN RT LEG WITH PROLONGED STANDING OR WALKING- THINKS RELATED TO HER SPINE PROBLEMS  . Dental crowns present   . Depression   . Family history of anesthesia complication    pt's mother and sister have hx. of post-op N/V  . Fibroids    UTERINE  . Gastroesophageal reflux disease    none for 2 years since Bariatric surgery.  . H/O hiatal hernia   . History of endometriosis   . History of kidney stones   . Hyperlipidemia   . Hypertension    under control with med., has been on med. x 2 yr.  . Insulin dependent diabetes mellitus   . Lock jaw    jaw locks open if opens mouth wide  . Migraines   . Neuropathy    FEET  . Obesity   . Palpitations   . PONV (postoperative nausea and vomiting)   . Shortness of breath    with daily activities  . Sleep apnea     post-op didn't need CPAP but now states needs it again but not using    Past Surgical History:  Procedure Laterality Date  . ABDOMINAL HYSTERECTOMY N/A 06/06/2017   Procedure: HYSTERECTOMY ABDOMINAL;  Surgeon: Florian Buff, MD;  [facility];  Service: Gynecology;  Laterality: N/A;  . BREATH TEK H PYLORI N/A 08/13/2012   Procedure: BREATH TEK H PYLORI;  Surgeon: Edward Jolly, MD;  Location: [facility];  Service: General;  Laterality: N/A;  . CARPAL TUNNEL RELEASE  01/03/2012   Procedure: CARPAL TUNNEL RELEASE;  Surgeon: Wynonia Sours, MD;  Location: [facility];  Service: Orthopedics;  Laterality: Right;  . carpal tunnel release right hand Right 12/13  . COLONOSCOPY  05/2009   OAC:[id number] hemorrhoids otherwise normal colon, rectum and terminal ileum  . COLONOSCOPY WITH ESOPHAGOGASTRODUODENOSCOPY (EGD) N/A 10/28/2012   Procedure: COLONOSCOPY WITH ESOPHAGOGASTRODUODENOSCOPY (EGD);  Surgeon: Daneil Dolin, MD;  [facility];  Service: Endoscopy;  Laterality: N/A;  7:30  . DILITATION & CURRETTAGE/HYSTROSCOPY WITH NOVASURE ABLATION N/A 07/22/2014   Procedure: DILATATION & CURETTAGE/HYSTEROSCOPY WITH NOVASURE ABLATION; uterine length 6.0 cm; uterine width 3.8 cm; total  ablation time 1 minute 15 seconds;  Surgeon: Florian Buff, MD;  [facility];  Service: Gynecology;  Laterality: N/A;  . ESOPHAGOGASTRODUODENOSCOPY  5/013/2011   EXH:[id number] esophagus/multiple polyps removed s/p (hyperplastic). Gastritis without H.pylori.  . ESOPHAGOGASTRODUODENOSCOPY (EGD) WITH PROPOFOL N/A 06/06/2018   with LA Grade A esophagitis s/p dilation.   Marland Kitchen GASTRIC ROUX-EN-Y N/A 11/19/2012   Procedure: LAPAROSCOPIC ROUX-EN-Y GASTRIC;  Surgeon: Edward Jolly, MD;  [facility];  Service: General;  Laterality: N/A;  . GIVENS CAPSULE STUDY N/A 10/28/2012   Procedure: GIVENS CAPSULE STUDY;  Surgeon: Daneil Dolin, MD;  [facility];  Service: Endoscopy;  Laterality:  N/A;  . KNEE ARTHROSCOPY WITH MEDIAL MENISECTOMY Right 10/28/2015   Procedure: RIGHT KNEE ARTHROSCOPY WITH MEDIAL MENISECTOMY;  Surgeon: Carole Civil, MD;  [facility];  Service: Orthopedics;  Laterality: Right;  . LAPAROSCOPY     due to endometriosis  . MALONEY DILATION N/A 06/06/2018   Procedure: Venia Minks DILATION;  Surgeon: Daneil Dolin, MD;  [facility];  Service: Endoscopy;  Laterality: N/A;  . PELVIC LAPAROSCOPY  11/04/1999   with fulguration of endometriosis  . SALPINGOOPHORECTOMY Bilateral 06/06/2017   Procedure: SALPINGO OOPHORECTOMY;  Surgeon: Florian Buff, MD;  [facility];  Service: Gynecology;  Laterality: Bilateral;  . TOTAL KNEE ARTHROPLASTY Right 07/22/2019   Procedure: TOTAL KNEE ARTHROPLASTY;  Surgeon: Carole Civil, MD;  [facility];  Service: Orthopedics;  Laterality: Right;  . TRIGGER FINGER RELEASE Right 09/24/2012   Procedure: RELEASE A-1 PULLEY OF RIGHT THUMB;  Surgeon: Wynonia Sours, MD;  Location: [facility];  Service: Orthopedics;  Laterality: Right;  . TUBAL LIGATION  1993    There were no vitals filed for this visit.   Subjective Assessment - 12/05/19 1136    Subjective PT states that she has been packing alot of boxes due to fathers death.  She has numbness in her foot about once a day now which is better than it was in the past.    Pertinent History RT TKA    Limitations Standing;Lifting;Walking;House hold activities    How long can you sit comfortably? 30 minutes    How long can you stand comfortably? 20 minutes    How long can you walk comfortably? no issues    Diagnostic tests xrays    Patient Stated Goals decrease pain    Currently in Pain? Yes    Pain Score 3     Pain Location Back    Pain Orientation Right    Pain Descriptors / Indicators Burning;Sore    Pain Type Acute pain    Pain Onset More than a month ago    Pain Frequency Constant    Aggravating Factors  backing , bending    Pain  Relieving Factors meds    Effect of Pain on Daily Activities limits                             [facility] Adult PT Treatment/Exercise - 12/05/19 0001      Exercises   Exercises Lumbar      Lumbar Exercises: Stretches   Passive Hamstring Stretch Right;Left;3 reps;30 seconds    Passive Hamstring Stretch Limitations with ankle dF/PF     Prone on Elbows Stretch 1 rep;60 seconds    Press Ups 10 reps      Lumbar Exercises: Standing   Other Standing Lumbar Exercises hip  excursion exercise      Lumbar Exercises: Seated   Sit to Stand 10 reps    Other Seated Lumbar Exercises posture exercise     Other Seated Lumbar Exercises abdominal activation with scapular retraction       Lumbar Exercises: Prone   Straight Leg Raise 10 reps                    PT Short Term Goals - 12/05/19 1203      PT SHORT TERM GOAL #1   Title Patient will be independent with initial HEP and self-management strategies to improve functional outcomes    Time 3    Period Weeks    Status On-going    Target Date 12/19/19             PT Long Term Goals - 12/05/19 1203      PT LONG TERM GOAL #1   Title Patient will improve FOTO score by at least 10% to indicate improvement in functional outcomes    Time 6    Period Weeks    Status On-going      PT LONG TERM GOAL #2   Title Patient will report at least 75% overall improvement in subjective complaint to indicate improvement in ability to perform ADLs.    Time 6    Period Weeks    Status On-going      PT LONG TERM GOAL #3   Title Patient will have equal to or > 4+/5 MMT throughout BLE to improve ability to perform functional mobility, stair ambulation and ADLs.    Time 6    Period Weeks    Status On-going      PT LONG TERM GOAL #4   Title Patient will be independent with initial HEP and self-management strategies to improve functional outcomes    Time 6    Period Weeks    Status On-going                 Plan -  12/05/19 1200    Clinical Impression Statement Pt continues to be packing her fathers belongings as he has recently passed.  PT is having sorness B with occasional RT foot numbess.  Therapist went over posture and the importance of good posture as well as sit to stand body mechanics.    Examination-Activity Limitations Locomotion Level;Transfers;Squat;Bend;Sleep    Examination-Participation Restrictions Community Activity;[facility]    Stability/Clinical Decision Making Stable/Uncomplicated    Rehab Potential Good    PT Frequency 3x / week    PT Duration 6 weeks    PT Treatment/Interventions ADLs/Self Care Home Management;Aquatic Therapy;Biofeedback;Cryotherapy;Electrical Stimulation;Moist Heat;DME Instruction;Gait training;Stair training;Functional mobility training;Therapeutic activities;Therapeutic exercise;Balance training;Neuromuscular re-education;Patient/family education;Manual techniques;Scar mobilization;Passive range of motion;Dry needling;Taping;Joint Manipulations    PT Next Visit Plan Focus on core and glute strengthening for improved lumbar stabilization. Add manuals to address pain and restricitons as needed.    PT Home Exercise Plan 11/24/19: ab set, bridge, piriformis stretch 11/10 POE, press up, standing ext; 11/12:  sit to stand, sitting tall, abdominal activation with scapular retraction while sitting    Consulted and Agree with Plan of Care Patient           Patient will benefit from skilled therapeutic intervention in order to improve the following deficits and impairments:  Postural dysfunction, Pain, Decreased activity tolerance, Decreased strength, Impaired perceived functional ability, Improper body mechanics  Visit Diagnosis: Low back pain, unspecified back pain laterality, unspecified chronicity, unspecified whether sciatica present  Abnormal posture     Problem List Patient Active Problem List   Diagnosis Date Noted  . Acute right-sided back pain with  sciatica 10/02/2019  . S/P total knee replacement, right 07/22/19  09/01/2019  . Primary localized osteoarthritis of left knee 07/22/2019  . Primary osteoarthritis of right knee   . Constipation 09/05/2018  . Abdominal pain, chronic, epigastric 05/27/2018  . Dysphagia 05/27/2018  . Depression, major, single episode, moderate ([place]) 03/22/2018  . S/P complete hysterectomy 06/06/2017  . Derangement of posterior horn of medial meniscus of right knee   . Right knee pain 09/10/2015  . Iron deficiency anemia 10/16/2013  . Fibroids 08/21/2013  . Excessive or frequent menstruation 06/19/2013  . Morbid obesity ([place]) 07/12/2012  . Obstructive sleep apnea 06/21/2012  . Chest pain   . Hyperlipidemia   . Type 2 diabetes mellitus ([place])   . Obesity   . Laboratory test   . Nephrolithiasis   . Asthma   . Endometriosis   . Hypertension   . Iron deficiency anemia 05/11/2009  . GERD 05/11/2009   Rayetta Humphrey, PT CLT [phone] 12/05/2019, 12:19 PM  [place] [address] [place], [place], [address] Phone: [phone]   Fax:  [phone]  Name: ALESA ECHEVARRIA MRN: [id number] Date of Birth: 31-May-1960

## 2019-12-08 ENCOUNTER — Ambulatory Visit (HOSPITAL_COMMUNITY): Payer: Medicare Other | Admitting: Physical Therapy

## 2019-12-08 ENCOUNTER — Other Ambulatory Visit: Payer: Self-pay

## 2019-12-08 ENCOUNTER — Encounter (HOSPITAL_COMMUNITY): Payer: Self-pay | Admitting: Physical Therapy

## 2019-12-08 DIAGNOSIS — M545 Low back pain, unspecified: Secondary | ICD-10-CM | POA: Diagnosis not present

## 2019-12-08 DIAGNOSIS — R293 Abnormal posture: Secondary | ICD-10-CM

## 2019-12-08 DIAGNOSIS — R2689 Other abnormalities of gait and mobility: Secondary | ICD-10-CM

## 2019-12-08 NOTE — Therapy (Signed)
[place], [place], [address] Phone: [phone]   Fax:  [phone]  Physical Therapy Treatment  Patient Details  Name: Carrie Cook MRN: [id number] Date of Birth: 03-04-1960 Referring Provider (PT): Arther Abbott, MD   Encounter Date: 12/08/2019   PT End of Session - 12/08/19 1000    Visit Number 4    Number of Visits 18    Date for PT Re-Evaluation 01/09/20    Authorization Type Medicare Part A    Progress Note Due on Visit 10    PT Start Time 1001    PT Stop Time 1040    PT Time Calculation (min) 39 min    Activity Tolerance Patient tolerated treatment well    Behavior During Therapy [facility] for tasks assessed/performed           Past Medical History:  Diagnosis Date  . Anemia    PT HAS HAD COLONOSCOPY AND ENDOSCOPY WORK UP - NO PROBLEMS FOUND AS SOURCE OF ANEMIA -- PT HAD IRON INFUSION AT [facility] 11/13/12-AFTER SEEING HEMATOLOGIST DR. Delmar AND HE GAVE HEMATOLOGIC CLEARANCE FOR GASTRIC BYPASS SURGERY.  Marland Kitchen Anxiety   . Asthma    daily and prn inhalers  . Degenerative joint disease    right knee, spine - STATES INTERMITTENT  NUMBNESS DOWN RT LEG WITH PROLONGED STANDING OR WALKING- THINKS RELATED TO HER SPINE PROBLEMS  . Dental crowns present   . Depression   . Family history of anesthesia complication    pt's mother and sister have hx. of post-op N/V  . Fibroids    UTERINE  . Gastroesophageal reflux disease    none for 2 years since Bariatric surgery.  . H/O hiatal hernia   . History of endometriosis   . History of kidney stones   . Hyperlipidemia   . Hypertension    under control with med., has been on med. x 2 yr.  . Insulin dependent diabetes mellitus   . Lock jaw    jaw locks open if opens mouth wide  . Migraines   . Neuropathy    FEET  . Obesity   . Palpitations   . PONV (postoperative nausea and vomiting)   . Shortness of breath    with daily activities  . Sleep apnea     post-op didn't need CPAP but now states needs it again but not using    Past Surgical History:  Procedure Laterality Date  . ABDOMINAL HYSTERECTOMY N/A 06/06/2017   Procedure: HYSTERECTOMY ABDOMINAL;  Surgeon: Florian Buff, MD;  [facility];  Service: Gynecology;  Laterality: N/A;  . BREATH TEK H PYLORI N/A 08/13/2012   Procedure: BREATH TEK H PYLORI;  Surgeon: Edward Jolly, MD;  Location: [facility];  Service: General;  Laterality: N/A;  . CARPAL TUNNEL RELEASE  01/03/2012   Procedure: CARPAL TUNNEL RELEASE;  Surgeon: Wynonia Sours, MD;  Location: [place];  Service: Orthopedics;  Laterality: Right;  . carpal tunnel release right hand Right 12/13  . COLONOSCOPY  05/2009   QJJ:HERDEYCX hemorrhoids otherwise normal colon, rectum and terminal ileum  . COLONOSCOPY WITH ESOPHAGOGASTRODUODENOSCOPY (EGD) N/A 10/28/2012   Procedure: COLONOSCOPY WITH ESOPHAGOGASTRODUODENOSCOPY (EGD);  Surgeon: Daneil Dolin, MD;  [facility];  Service: Endoscopy;  Laterality: N/A;  7:30  . DILITATION & CURRETTAGE/HYSTROSCOPY WITH NOVASURE ABLATION N/A 07/22/2014   Procedure: DILATATION & CURETTAGE/HYSTEROSCOPY WITH NOVASURE ABLATION; uterine length 6.0 cm; uterine width 3.8 cm; total  ablation time 1 minute 15 seconds;  Surgeon: Florian Buff, MD;  [facility];  Service: Gynecology;  Laterality: N/A;  . ESOPHAGOGASTRODUODENOSCOPY  5/013/2011   OAC:ZYSAYT esophagus/multiple polyps removed s/p (hyperplastic). Gastritis without H.pylori.  . ESOPHAGOGASTRODUODENOSCOPY (EGD) WITH PROPOFOL N/A 06/06/2018   with LA Grade A esophagitis s/p dilation.   Marland Kitchen GASTRIC ROUX-EN-Y N/A 11/19/2012   Procedure: LAPAROSCOPIC ROUX-EN-Y GASTRIC;  Surgeon: Edward Jolly, MD;  [facility];  Service: General;  Laterality: N/A;  . GIVENS CAPSULE STUDY N/A 10/28/2012   Procedure: GIVENS CAPSULE STUDY;  Surgeon: Daneil Dolin, MD;  [facility];  Service: Endoscopy;  Laterality:  N/A;  . KNEE ARTHROSCOPY WITH MEDIAL MENISECTOMY Right 10/28/2015   Procedure: RIGHT KNEE ARTHROSCOPY WITH MEDIAL MENISECTOMY;  Surgeon: Carole Civil, MD;  [facility];  Service: Orthopedics;  Laterality: Right;  . LAPAROSCOPY     due to endometriosis  . MALONEY DILATION N/A 06/06/2018   Procedure: Venia Minks DILATION;  Surgeon: Daneil Dolin, MD;  [facility];  Service: Endoscopy;  Laterality: N/A;  . PELVIC LAPAROSCOPY  11/04/1999   with fulguration of endometriosis  . SALPINGOOPHORECTOMY Bilateral 06/06/2017   Procedure: SALPINGO OOPHORECTOMY;  Surgeon: Florian Buff, MD;  [facility];  Service: Gynecology;  Laterality: Bilateral;  . TOTAL KNEE ARTHROPLASTY Right 07/22/2019   Procedure: TOTAL KNEE ARTHROPLASTY;  Surgeon: Carole Civil, MD;  [facility];  Service: Orthopedics;  Laterality: Right;  . TRIGGER FINGER RELEASE Right 09/24/2012   Procedure: RELEASE A-1 PULLEY OF RIGHT THUMB;  Surgeon: Wynonia Sours, MD;  Location: [facility];  Service: Orthopedics;  Laterality: Right;  . TUBAL LIGATION  1993    There were no vitals filed for this visit.   Subjective Assessment - 12/08/19 1001    Subjective Patient states her back has been getting sorer and sorer. Her back is painful on both sides but greater in R vs left. She is having minimal/no symptoms into R foot but is now having burning into R mid thigh.    Pertinent History RT TKA    Limitations Standing;Lifting;Walking;House hold activities    How long can you sit comfortably? 30 minutes    How long can you stand comfortably? 20 minutes    How long can you walk comfortably? no issues    Diagnostic tests xrays    Patient Stated Goals decrease pain    Currently in Pain? Yes    Pain Score 4     Pain Location Back    Pain Orientation Lower;Right    Pain Descriptors / Indicators Burning;Sore    Pain Type Acute pain    Pain Onset More than a month ago                              [facility] Adult PT Treatment/Exercise - 12/08/19 0001      Lumbar Exercises: Stretches   Passive Hamstring Stretch Right    Passive Hamstring Stretch Limitations 10 reps with ankle DP/PF    Prone on Elbows Stretch 3 reps;20 seconds    Press Ups 10 reps    Press Ups Limitations 2 second holds, 2 sets     Other Lumbar Stretch Exercise standing extension at wall 1x 10      Lumbar Exercises: Seated   Other Seated Lumbar Exercises ab sets with exhale 10x 5 second holds  PT Education - 12/08/19 1000    Education Details Patient educated on HEP, exercise mechanics,    Person(s) Educated Patient    Methods Explanation;Demonstration    Comprehension Verbalized understanding;Returned demonstration            PT Short Term Goals - 12/05/19 1203      PT SHORT TERM GOAL #1   Title Patient will be independent with initial HEP and self-management strategies to improve functional outcomes    Time 3    Period Weeks    Status On-going    Target Date 12/19/19             PT Long Term Goals - 12/05/19 1203      PT LONG TERM GOAL #1   Title Patient will improve FOTO score by at least 10% to indicate improvement in functional outcomes    Time 6    Period Weeks    Status On-going      PT LONG TERM GOAL #2   Title Patient will report at least 75% overall improvement in subjective complaint to indicate improvement in ability to perform ADLs.    Time 6    Period Weeks    Status On-going      PT LONG TERM GOAL #3   Title Patient will have equal to or > 4+/5 MMT throughout BLE to improve ability to perform functional mobility, stair ambulation and ADLs.    Time 6    Period Weeks    Status On-going      PT LONG TERM GOAL #4   Title Patient will be independent with initial HEP and self-management strategies to improve functional outcomes    Time 6    Period Weeks    Status On-going                 Plan - 12/08/19  1001    Clinical Impression Statement Patient's description of her symptoms appear to be possible centralization of symptoms. Patient educated on centralization being a good sign. Patient mostly does standing extension exercises for HEP, continued today's session with extension based exercises. Patient experiences improvement in symptoms following POE/press ups in low back and RLE. Patient also feels improvement in symptoms following active hamstring stretch/nerve glides. She is educated and able to perform TRA activation with exhale. Patient will continue to benefit from skilled physical therapy in order to reduce impairment and improve function.    Examination-Activity Limitations Locomotion Level;Transfers;Squat;Bend;Sleep    Examination-Participation Restrictions Community Activity;Valla Leaver [facility]    Stability/Clinical Decision Making Stable/Uncomplicated    Rehab Potential Good    PT Frequency 3x / week    PT Duration 6 weeks    PT Treatment/Interventions ADLs/Self Care Home Management;Aquatic Therapy;Biofeedback;Cryotherapy;Electrical Stimulation;Moist Heat;DME Instruction;Gait training;Stair training;Functional mobility training;Therapeutic activities;Therapeutic exercise;Balance training;Neuromuscular re-education;Patient/family education;Manual techniques;Scar mobilization;Passive range of motion;Dry needling;Taping;Joint Manipulations    PT Next Visit Plan Focus on core and glute strengthening for improved lumbar stabilization. Add manuals to address pain and restricitons as needed.    PT Home Exercise Plan 11/24/19: ab set, bridge, piriformis stretch 11/10 POE, press up, standing ext; 11/12:  sit to stand, sitting tall, abdominal activation with scapular retraction while sitting    Consulted and Agree with Plan of Care Patient           Patient will benefit from skilled therapeutic intervention in order to improve the following deficits and impairments:  Postural dysfunction, Pain,  Decreased activity tolerance, Decreased strength, Impaired perceived functional ability, Improper body mechanics  Visit Diagnosis: Low back pain, unspecified back pain laterality, unspecified chronicity, unspecified whether sciatica present  Abnormal posture  Other abnormalities of gait and mobility     Problem List Patient Active Problem List   Diagnosis Date Noted  . Acute right-sided back pain with sciatica 10/02/2019  . S/P total knee replacement, right 07/22/19  09/01/2019  . Primary localized osteoarthritis of left knee 07/22/2019  . Primary osteoarthritis of right knee   . Constipation 09/05/2018  . Abdominal pain, chronic, epigastric 05/27/2018  . Dysphagia 05/27/2018  . Depression, major, single episode, moderate ([place]) 03/22/2018  . S/P complete hysterectomy 06/06/2017  . Derangement of posterior horn of medial meniscus of right knee   . Right knee pain 09/10/2015  . Iron deficiency anemia 10/16/2013  . Fibroids 08/21/2013  . Excessive or frequent menstruation 06/19/2013  . Morbid obesity ([place]) 07/12/2012  . Obstructive sleep apnea 06/21/2012  . Chest pain   . Hyperlipidemia   . Type 2 diabetes mellitus ([place])   . Obesity   . Laboratory test   . Nephrolithiasis   . Asthma   . Endometriosis   . Hypertension   . Iron deficiency anemia 05/11/2009  . GERD 05/11/2009    10:48 AM, 12/08/19 Mearl Latin PT, DPT Physical Therapist at [place], [place], [address] Phone: [phone]   Fax:  [phone]  Name: Carrie Cook MRN: [id number] Date of Birth: [birth year]

## 2019-12-10 ENCOUNTER — Telehealth (HOSPITAL_COMMUNITY): Payer: Self-pay | Admitting: Physical Therapy

## 2019-12-10 ENCOUNTER — Ambulatory Visit (HOSPITAL_COMMUNITY): Payer: Medicare Other | Admitting: Physical Therapy

## 2019-12-10 DIAGNOSIS — F339 Major depressive disorder, recurrent, unspecified: Secondary | ICD-10-CM | POA: Diagnosis not present

## 2019-12-10 NOTE — Telephone Encounter (Signed)
pt called to cx this appt due to she has another doctor's appt.

## 2019-12-15 ENCOUNTER — Ambulatory Visit (HOSPITAL_COMMUNITY): Payer: Medicare Other

## 2019-12-15 ENCOUNTER — Encounter (HOSPITAL_COMMUNITY): Payer: Self-pay

## 2019-12-15 ENCOUNTER — Other Ambulatory Visit: Payer: Self-pay

## 2019-12-15 DIAGNOSIS — M545 Low back pain, unspecified: Secondary | ICD-10-CM | POA: Diagnosis not present

## 2019-12-15 DIAGNOSIS — R2689 Other abnormalities of gait and mobility: Secondary | ICD-10-CM

## 2019-12-15 DIAGNOSIS — R293 Abnormal posture: Secondary | ICD-10-CM

## 2019-12-15 NOTE — Patient Instructions (Signed)
Robin McKenzie's "7 Steps to a Pain Free Life"  Straight Leg Raise    Bend one leg. Raise other leg 9-12__ inches with knee locked. Exhale and tighten thigh muscles while raising leg. Repeat with other leg. Repeat 10-15____ times. Do _1___ sessions per day.  http://gt2.exer.us/270   Copyright  VHI. All rights reserved.  Straight Leg Raise (Prone)    Abdomen and head supported, keep left knee locked and raise leg at hip. Avoid arching low back. Repeat _10-15__ times per set. Do __1__ sets per session. Do _1___ sessions per day.  http://orth.exer.us/1113   Copyright  VHI. All rights reserved.

## 2019-12-15 NOTE — Therapy (Signed)
[place], [place], [address] Phone: [phone]   Fax:  [phone]  Physical Therapy Treatment  Patient Details  Name: Carrie Cook MRN: [id number] Date of Birth: January 30, 1960 Referring Provider (PT): Arther Abbott, MD   Encounter Date: 12/15/2019   PT End of Session - 12/15/19 1103    Visit Number 5    Number of Visits 18    Date for PT Re-Evaluation 01/09/20    Authorization Type Medicare Part A    Progress Note Due on Visit 10    PT Start Time 1106    PT Stop Time 1155    PT Time Calculation (min) 49 min    Activity Tolerance Patient tolerated treatment well    Behavior During Therapy [facility] for tasks assessed/performed           Past Medical History:  Diagnosis Date  . Anemia    PT HAS HAD COLONOSCOPY AND ENDOSCOPY WORK UP - NO PROBLEMS FOUND AS SOURCE OF ANEMIA -- PT HAD IRON INFUSION AT [facility] 11/13/12-AFTER SEEING HEMATOLOGIST DR. [place] AND HE GAVE HEMATOLOGIC CLEARANCE FOR GASTRIC BYPASS SURGERY.  Marland Kitchen Anxiety   . Asthma    daily and prn inhalers  . Degenerative joint disease    right knee, spine - STATES INTERMITTENT  NUMBNESS DOWN RT LEG WITH PROLONGED STANDING OR WALKING- THINKS RELATED TO HER SPINE PROBLEMS  . Dental crowns present   . Depression   . Family history of anesthesia complication    pt's mother and sister have hx. of post-op N/V  . Fibroids    UTERINE  . Gastroesophageal reflux disease    none for 2 years since Bariatric surgery.  . H/O hiatal hernia   . History of endometriosis   . History of kidney stones   . Hyperlipidemia   . Hypertension    under control with med., has been on med. x 2 yr.  . Insulin dependent diabetes mellitus   . Lock jaw    jaw locks open if opens mouth wide  . Migraines   . Neuropathy    FEET  . Obesity   . Palpitations   . PONV (postoperative nausea and vomiting)   . Shortness of breath    with daily activities  . Sleep apnea     post-op didn't need CPAP but now states needs it again but not using    Past Surgical History:  Procedure Laterality Date  . ABDOMINAL HYSTERECTOMY N/A 06/06/2017   Procedure: HYSTERECTOMY ABDOMINAL;  Surgeon: Florian Buff, MD;  [facility];  Service: Gynecology;  Laterality: N/A;  . BREATH TEK H PYLORI N/A 08/13/2012   Procedure: BREATH TEK H PYLORI;  Surgeon: Edward Jolly, MD;  Location: [facility];  Service: General;  Laterality: N/A;  . CARPAL TUNNEL RELEASE  01/03/2012   Procedure: CARPAL TUNNEL RELEASE;  Surgeon: Wynonia Sours, MD;  Location: [facility];  Service: Orthopedics;  Laterality: Right;  . carpal tunnel release right hand Right 12/13  . COLONOSCOPY  05/2009   QIH:[id number] hemorrhoids otherwise normal colon, rectum and terminal ileum  . COLONOSCOPY WITH ESOPHAGOGASTRODUODENOSCOPY (EGD) N/A 10/28/2012   Procedure: COLONOSCOPY WITH ESOPHAGOGASTRODUODENOSCOPY (EGD);  Surgeon: Daneil Dolin, MD;  [facility];  Service: Endoscopy;  Laterality: N/A;  7:30  . DILITATION & CURRETTAGE/HYSTROSCOPY WITH NOVASURE ABLATION N/A 07/22/2014   Procedure: DILATATION & CURETTAGE/HYSTEROSCOPY WITH NOVASURE ABLATION; uterine length 6.0 cm; uterine width 3.8 cm; total  ablation time 1 minute 15 seconds;  Surgeon: Florian Buff, MD;  [facility];  Service: Gynecology;  Laterality: N/A;  . ESOPHAGOGASTRODUODENOSCOPY  5/013/2011   NAT:FTDDUK esophagus/multiple polyps removed s/p (hyperplastic). Gastritis without H.pylori.  . ESOPHAGOGASTRODUODENOSCOPY (EGD) WITH PROPOFOL N/A 06/06/2018   with LA Grade A esophagitis s/p dilation.   Marland Kitchen GASTRIC ROUX-EN-Y N/A 11/19/2012   Procedure: LAPAROSCOPIC ROUX-EN-Y GASTRIC;  Surgeon: Edward Jolly, MD;  [facility];  Service: General;  Laterality: N/A;  . GIVENS CAPSULE STUDY N/A 10/28/2012   Procedure: GIVENS CAPSULE STUDY;  Surgeon: Daneil Dolin, MD;  [facility];  Service: Endoscopy;  Laterality:  N/A;  . KNEE ARTHROSCOPY WITH MEDIAL MENISECTOMY Right 10/28/2015   Procedure: RIGHT KNEE ARTHROSCOPY WITH MEDIAL MENISECTOMY;  Surgeon: Carole Civil, MD;  [facility];  Service: Orthopedics;  Laterality: Right;  . LAPAROSCOPY     due to endometriosis  . MALONEY DILATION N/A 06/06/2018   Procedure: Venia Minks DILATION;  Surgeon: Daneil Dolin, MD;  [facility];  Service: Endoscopy;  Laterality: N/A;  . PELVIC LAPAROSCOPY  11/04/1999   with fulguration of endometriosis  . SALPINGOOPHORECTOMY Bilateral 06/06/2017   Procedure: SALPINGO OOPHORECTOMY;  Surgeon: Florian Buff, MD;  [facility];  Service: Gynecology;  Laterality: Bilateral;  . TOTAL KNEE ARTHROPLASTY Right 07/22/2019   Procedure: TOTAL KNEE ARTHROPLASTY;  Surgeon: Carole Civil, MD;  [facility];  Service: Orthopedics;  Laterality: Right;  . TRIGGER FINGER RELEASE Right 09/24/2012   Procedure: RELEASE A-1 PULLEY OF RIGHT THUMB;  Surgeon: Wynonia Sours, MD;  Location: [facility];  Service: Orthopedics;  Laterality: Right;  . TUBAL LIGATION  1993    There were no vitals filed for this visit.   Subjective Assessment - 12/15/19 1102    Subjective Exercises from last session really seem to be helping.    Pertinent History RT TKA    Limitations Standing;Lifting;Walking;House hold activities    How long can you sit comfortably? 30 minutes    How long can you stand comfortably? 20 minutes    How long can you walk comfortably? no issues    Diagnostic tests xrays    Patient Stated Goals decrease pain    Currently in Pain? Yes    Pain Score 2     Pain Location Back    Pain Orientation Right;Left;Posterior;Lower    Pain Radiating Towards right hip to just below knee    Pain Onset More than a month ago    Aggravating Factors  sit, driving, stand            [facility] Adult PT Treatment/Exercise - 12/15/19 0001      Posture/Postural Control   Posture Comments In supine, level pelvis  with right med malleous elevated      Self-Care   Self-Care Posture    Posture sleeping, sitting, driving, standing/dishes, vacuuming      Lumbar Exercises: Supine   Bridge Non-compliant;10 reps;2 seconds    Bridge Limitations ab set first    Straight Leg Raise 10 reps;2 seconds    Straight Leg Raises Limitations 1 set bilateral; ab set first      Lumbar Exercises: Prone   Straight Leg Raise 10 reps;2 seconds    Straight Leg Raises Limitations ab set first; alternating            PT Education - 12/15/19 1141    Education Details Discussed purpose and technique of interventions throughout  session. Advanced HEP.    Person(s) Educated Patient    Methods Explanation;Handout    Comprehension Verbalized understanding            PT Short Term Goals - 12/15/19 1104      PT SHORT TERM GOAL #1   Title Patient will be independent with initial HEP and self-management strategies to improve functional outcomes    Time 3    Period Weeks    Status On-going    Target Date 12/19/19             PT Long Term Goals - 12/15/19 1104      PT LONG TERM GOAL #1   Title Patient will improve FOTO score by at least 10% to indicate improvement in functional outcomes    Time 6    Period Weeks    Status On-going      PT LONG TERM GOAL #2   Title Patient will report at least 75% overall improvement in subjective complaint to indicate improvement in ability to perform ADLs.    Time 6    Period Weeks    Status On-going      PT LONG TERM GOAL #3   Title Patient will have equal to or > 4+/5 MMT throughout BLE to improve ability to perform functional mobility, stair ambulation and ADLs.    Time 6    Period Weeks    Status On-going      PT LONG TERM GOAL #4   Title Patient will be independent with initial HEP and self-management strategies to improve functional outcomes    Time 6    Period Weeks    Status On-going             Plan - 12/15/19 1103    Clinical Impression Statement  Patient reporting improvement in symptoms with extension biased exercises. Session focused on patient education of static and dynamic back postures, including sleeping, sitting, standing, driving and vacuuming and effect they have on the back. Recommended Robin McKenzie's "7 Steps to a Pain Free Life" for further education on biomechanics. In supine, patient pelvis is level with right medial malleolus elevated, likely due to limited knee extension post TKA on the right. Added supine bridge, supine straight leg raises and prone straight leg raises to therapeutic exercises as well as HEP. Focus on abdominal stabilization first. Patient reported no increase in pain at end of session but fatigue from exercises. Patient will continue to benefit from skilled physical therapy in order to reduce impairment and improve function.    Examination-Activity Limitations Locomotion Level;Transfers;Squat;Bend;Sleep    Examination-Participation Restrictions Community Activity;Valla Leaver [facility]    Stability/Clinical Decision Making Stable/Uncomplicated    Rehab Potential Good    PT Frequency 3x / week    PT Duration 6 weeks    PT Treatment/Interventions ADLs/Self Care Home Management;Aquatic Therapy;Biofeedback;Cryotherapy;Electrical Stimulation;Moist Heat;DME Instruction;Gait training;Stair training;Functional mobility training;Therapeutic activities;Therapeutic exercise;Balance training;Neuromuscular re-education;Patient/family education;Manual techniques;Scar mobilization;Passive range of motion;Dry needling;Taping;Joint Manipulations    PT Next Visit Plan Focus on core and glute strengthening for improved lumbar stabilization. Add manuals to address pain and restricitons as needed.    PT Home Exercise Plan 11/24/19: ab set, bridge, piriformis stretch 11/10 POE, press up, standing ext; 11/12:  sit to stand, sitting tall, abdominal activation with scapular retraction while sitting; 12/15/19 - supine and prone SLR w/ ab set     Consulted and Agree with Plan of Care Patient           Patient will  benefit from skilled therapeutic intervention in order to improve the following deficits and impairments:  Postural dysfunction, Pain, Decreased activity tolerance, Decreased strength, Impaired perceived functional ability, Improper body mechanics  Visit Diagnosis: Low back pain, unspecified back pain laterality, unspecified chronicity, unspecified whether sciatica present  Abnormal posture  Other abnormalities of gait and mobility     Problem List Patient Active Problem List   Diagnosis Date Noted  . Acute right-sided back pain with sciatica 10/02/2019  . S/P total knee replacement, right 07/22/19  09/01/2019  . Primary localized osteoarthritis of left knee 07/22/2019  . Primary osteoarthritis of right knee   . Constipation 09/05/2018  . Abdominal pain, chronic, epigastric 05/27/2018  . Dysphagia 05/27/2018  . Depression, major, single episode, moderate ([place]) 03/22/2018  . S/P complete hysterectomy 06/06/2017  . Derangement of posterior horn of medial meniscus of right knee   . Right knee pain 09/10/2015  . Iron deficiency anemia 10/16/2013  . Fibroids 08/21/2013  . Excessive or frequent menstruation 06/19/2013  . Morbid obesity ([place]) 07/12/2012  . Obstructive sleep apnea 06/21/2012  . Chest pain   . Hyperlipidemia   . Type 2 diabetes mellitus ([place])   . Obesity   . Laboratory test   . Nephrolithiasis   . Asthma   . Endometriosis   . Hypertension   . Iron deficiency anemia 05/11/2009  . GERD 05/11/2009    Floria Raveling. Hartnett-Rands, MS, PT Per [place] #[id number] 12/15/2019, 12:06 PM  [place] [address] [place], [place], [address] Phone: [phone]   Fax:  [phone]  Name: Carrie Cook MRN: [id number] Date of Birth: 1960-07-23

## 2019-12-17 ENCOUNTER — Other Ambulatory Visit: Payer: Self-pay

## 2019-12-17 ENCOUNTER — Encounter (HOSPITAL_COMMUNITY): Payer: Self-pay | Admitting: Physical Therapy

## 2019-12-17 ENCOUNTER — Ambulatory Visit (HOSPITAL_COMMUNITY): Payer: Medicare Other | Admitting: Physical Therapy

## 2019-12-17 DIAGNOSIS — M545 Low back pain, unspecified: Secondary | ICD-10-CM | POA: Diagnosis not present

## 2019-12-17 DIAGNOSIS — R293 Abnormal posture: Secondary | ICD-10-CM

## 2019-12-17 DIAGNOSIS — R2689 Other abnormalities of gait and mobility: Secondary | ICD-10-CM

## 2019-12-17 NOTE — Therapy (Signed)
[place], [place], [address] Phone: [phone]   Fax:  [phone]  Physical Therapy Treatment  Patient Details  Name: Carrie Cook MRN: [id number] Date of Birth: 03-Jul-1960 Referring Provider (PT): Arther Abbott, MD   Encounter Date: 12/17/2019   PT End of Session - 12/17/19 0905    Visit Number 6    Number of Visits 18    Date for PT Re-Evaluation 01/09/20    Authorization Type Medicare Part A    Progress Note Due on Visit 10    PT Start Time 0900    PT Stop Time 0940    PT Time Calculation (min) 40 min    Activity Tolerance Patient tolerated treatment well    Behavior During Therapy [facility] for tasks assessed/performed           Past Medical History:  Diagnosis Date  . Anemia    PT HAS HAD COLONOSCOPY AND ENDOSCOPY WORK UP - NO PROBLEMS FOUND AS SOURCE OF ANEMIA -- PT HAD IRON INFUSION AT [facility] 11/13/12-AFTER SEEING HEMATOLOGIST DR. Fostoria AND HE GAVE HEMATOLOGIC CLEARANCE FOR GASTRIC BYPASS SURGERY.  Marland Kitchen Anxiety   . Asthma    daily and prn inhalers  . Degenerative joint disease    right knee, spine - STATES INTERMITTENT  NUMBNESS DOWN RT LEG WITH PROLONGED STANDING OR WALKING- THINKS RELATED TO HER SPINE PROBLEMS  . Dental crowns present   . Depression   . Family history of anesthesia complication    pt's mother and sister have hx. of post-op N/V  . Fibroids    UTERINE  . Gastroesophageal reflux disease    none for 2 years since Bariatric surgery.  . H/O hiatal hernia   . History of endometriosis   . History of kidney stones   . Hyperlipidemia   . Hypertension    under control with med., has been on med. x 2 yr.  . Insulin dependent diabetes mellitus   . Lock jaw    jaw locks open if opens mouth wide  . Migraines   . Neuropathy    FEET  . Obesity   . Palpitations   . PONV (postoperative nausea and vomiting)   . Shortness of breath    with daily activities  . Sleep apnea     post-op didn't need CPAP but now states needs it again but not using    Past Surgical History:  Procedure Laterality Date  . ABDOMINAL HYSTERECTOMY N/A 06/06/2017   Procedure: HYSTERECTOMY ABDOMINAL;  Surgeon: Florian Buff, MD;  [facility];  Service: Gynecology;  Laterality: N/A;  . BREATH TEK H PYLORI N/A 08/13/2012   Procedure: BREATH TEK H PYLORI;  Surgeon: Edward Jolly, MD;  Location[facility];  Service: General;  Laterality: N/A;  . CARPAL TUNNEL RELEASE  01/03/2012   Procedure: CARPAL TUNNEL RELEASE;  Surgeon: Wynonia Sours, MD;  Location: [place];  Service: Orthopedics;  Laterality: Right;  . carpal tunnel release right hand Right 12/13  . COLONOSCOPY  05/2009   [id number] hemorrhoids otherwise normal colon, rectum and terminal ileum  . COLONOSCOPY WITH ESOPHAGOGASTRODUODENOSCOPY (EGD) N/A 10/28/2012   Procedure: COLONOSCOPY WITH ESOPHAGOGASTRODUODENOSCOPY (EGD);  Surgeon: Daneil Dolin, MD;  [facility];  Service: Endoscopy;  Laterality: N/A;  7:30  . DILITATION & CURRETTAGE/HYSTROSCOPY WITH NOVASURE ABLATION N/A 07/22/2014   Procedure: DILATATION & CURETTAGE/HYSTEROSCOPY WITH NOVASURE ABLATION; uterine length 6.0 cm; uterine width 3.8 cm; total  ablation time 1 minute 15 seconds;  Surgeon: Florian Buff, MD;  [facility];  Service: Gynecology;  Laterality: N/A;  . ESOPHAGOGASTRODUODENOSCOPY  5/013/2011   ZOX:[id number] esophagus/multiple polyps removed s/p (hyperplastic). Gastritis without H.pylori.  . ESOPHAGOGASTRODUODENOSCOPY (EGD) WITH PROPOFOL N/A 06/06/2018   with LA Grade A esophagitis s/p dilation.   Marland Kitchen GASTRIC ROUX-EN-Y N/A 11/19/2012   Procedure: LAPAROSCOPIC ROUX-EN-Y GASTRIC;  Surgeon: Edward Jolly, MD;  [facility];  Service: General;  Laterality: N/A;  . GIVENS CAPSULE STUDY N/A 10/28/2012   Procedure: GIVENS CAPSULE STUDY;  Surgeon: Daneil Dolin, MD;  [facility];  Service: Endoscopy;  Laterality:  N/A;  . KNEE ARTHROSCOPY WITH MEDIAL MENISECTOMY Right 10/28/2015   Procedure: RIGHT KNEE ARTHROSCOPY WITH MEDIAL MENISECTOMY;  Surgeon: Carole Civil, MD;  [facility];  Service: Orthopedics;  Laterality: Right;  . LAPAROSCOPY     due to endometriosis  . MALONEY DILATION N/A 06/06/2018   Procedure: Venia Minks DILATION;  Surgeon: Daneil Dolin, MD;  [facility];  Service: Endoscopy;  Laterality: N/A;  . PELVIC LAPAROSCOPY  11/04/1999   with fulguration of endometriosis  . SALPINGOOPHORECTOMY Bilateral 06/06/2017   Procedure: SALPINGO OOPHORECTOMY;  Surgeon: Florian Buff, MD;  [facility];  Service: Gynecology;  Laterality: Bilateral;  . TOTAL KNEE ARTHROPLASTY Right 07/22/2019   Procedure: TOTAL KNEE ARTHROPLASTY;  Surgeon: Carole Civil, MD;  [facility];  Service: Orthopedics;  Laterality: Right;  . TRIGGER FINGER RELEASE Right 09/24/2012   Procedure: RELEASE A-1 PULLEY OF RIGHT THUMB;  Surgeon: Wynonia Sours, MD;  Location: [place];  Service: Orthopedics;  Laterality: Right;  . TUBAL LIGATION  1993    There were no vitals filed for this visit.   Subjective Assessment - 12/17/19 0904    Subjective Patient reports no new issues. Says things are going well. Says she was not able to do exercises yesterday because she was busy.    Pertinent History RT TKA    Limitations Standing;Lifting;Walking;House hold activities    How long can you sit comfortably? 30 minutes    How long can you stand comfortably? 20 minutes    How long can you walk comfortably? no issues    Diagnostic tests xrays    Patient Stated Goals decrease pain    Currently in Pain? Yes    Pain Score 1     Pain Location Back    Pain Orientation Posterior;Lower    Pain Descriptors / Indicators Burning    Pain Type Acute pain    Pain Onset More than a month ago                             [facility] Adult PT Treatment/Exercise - 12/17/19 0001      Lumbar  Exercises: Stretches   Passive Hamstring Stretch Right;2 reps;30 seconds    Passive Hamstring Stretch Limitations seated    Prone on Elbows Stretch 1 rep;60 seconds      Lumbar Exercises: Supine   Ab Set 10 reps;5 seconds    Bent Knee Raise 20 reps    Bridge 15 reps;5 seconds    Straight Leg Raise 15 reps    Other Supine Lumbar Exercises prone press up x10      Knee/Hip Exercises: Standing   SLS 2 x 30" each solid floor     Other Standing Knee Exercises band sidestepping 3RT RTB  Knee/Hip Exercises: Seated   Sit to Sand 2 sets;10 reps;without UE support                    PT Short Term Goals - 12/15/19 1104      PT SHORT TERM GOAL #1   Title Patient will be independent with initial HEP and self-management strategies to improve functional outcomes    Time 3    Period Weeks    Status On-going    Target Date 12/19/19             PT Long Term Goals - 12/15/19 1104      PT LONG TERM GOAL #1   Title Patient will improve FOTO score by at least 10% to indicate improvement in functional outcomes    Time 6    Period Weeks    Status On-going      PT LONG TERM GOAL #2   Title Patient will report at least 75% overall improvement in subjective complaint to indicate improvement in ability to perform ADLs.    Time 6    Period Weeks    Status On-going      PT LONG TERM GOAL #3   Title Patient will have equal to or > 4+/5 MMT throughout BLE to improve ability to perform functional mobility, stair ambulation and ADLs.    Time 6    Period Weeks    Status On-going      PT LONG TERM GOAL #4   Title Patient will be independent with initial HEP and self-management strategies to improve functional outcomes    Time 6    Period Weeks    Status On-going                 Plan - 12/17/19 1212    Clinical Impression Statement Patient tolerated session well today. Did note slight burning sensation around bilateral SI joint area during sit to stands. Added band  resisted sidestepping for improved glute med activation. Patient educated on proper form and function. Patient noted decreased burning sensation in low back with retest of sit to stand after banded sidestepping. Patient with good return of HEP exercise otherwise, and performs all activity in a slow and controlled manner. Patient will continue to benefit from skilled therapy services to progress hip and core strength to reduce pain and improve LOF with ADLs.    Examination-Activity Limitations Locomotion Level;Transfers;Squat;Bend;Sleep    Examination-Participation Restrictions Community Activity;Valla Leaver [facility]    Stability/Clinical Decision Making Stable/Uncomplicated    Rehab Potential Good    PT Frequency 3x / week    PT Duration 6 weeks    PT Treatment/Interventions ADLs/Self Care Home Management;Aquatic Therapy;Biofeedback;Cryotherapy;Electrical Stimulation;Moist Heat;DME Instruction;Gait training;Stair training;Functional mobility training;Therapeutic activities;Therapeutic exercise;Balance training;Neuromuscular re-education;Patient/family education;Manual techniques;Scar mobilization;Passive range of motion;Dry needling;Taping;Joint Manipulations    PT Next Visit Plan Focus on core and glute strengthening for improved lumbar stabilization. Add manuals to address pain and restricitons as needed.    PT Home Exercise Plan 11/24/19: ab set, bridge, piriformis stretch 11/10 POE, press up, standing ext; 11/12:  sit to stand, sitting tall, abdominal activation with scapular retraction while sitting; 12/15/19 - supine and prone SLR w/ ab set    Consulted and Agree with Plan of Care Patient           Patient will benefit from skilled therapeutic intervention in order to improve the following deficits and impairments:  Postural dysfunction, Pain, Decreased activity tolerance, Decreased strength, Impaired perceived functional ability, Improper body  mechanics  Visit Diagnosis: Low back pain,  unspecified back pain laterality, unspecified chronicity, unspecified whether sciatica present  Abnormal posture  Other abnormalities of gait and mobility     Problem List Patient Active Problem List   Diagnosis Date Noted  . Acute right-sided back pain with sciatica 10/02/2019  . S/P total knee replacement, right 07/22/19  09/01/2019  . Primary localized osteoarthritis of left knee 07/22/2019  . Primary osteoarthritis of right knee   . Constipation 09/05/2018  . Abdominal pain, chronic, epigastric 05/27/2018  . Dysphagia 05/27/2018  . Depression, major, single episode, moderate ([place]) 03/22/2018  . S/P complete hysterectomy 06/06/2017  . Derangement of posterior horn of medial meniscus of right knee   . Right knee pain 09/10/2015  . Iron deficiency anemia 10/16/2013  . Fibroids 08/21/2013  . Excessive or frequent menstruation 06/19/2013  . Morbid obesity ([place]) 07/12/2012  . Obstructive sleep apnea 06/21/2012  . Chest pain   . Hyperlipidemia   . Type 2 diabetes mellitus ([place])   . Obesity   . Laboratory test   . Nephrolithiasis   . Asthma   . Endometriosis   . Hypertension   . Iron deficiency anemia 05/11/2009  . GERD 05/11/2009   12:16 PM, 12/17/19 Josue Hector PT DPT  Physical Therapist with [facility]  [phone] [place] [address] [place], [place], [address] Phone: [phone]   Fax:  [phone]  Name: Carrie Cook MRN: [id number] Date of Birth: Jun 10, 1960

## 2019-12-22 ENCOUNTER — Encounter (HOSPITAL_COMMUNITY): Payer: Self-pay | Admitting: Physical Therapy

## 2019-12-22 ENCOUNTER — Ambulatory Visit (HOSPITAL_COMMUNITY): Payer: Medicare Other | Admitting: Physical Therapy

## 2019-12-22 ENCOUNTER — Other Ambulatory Visit: Payer: Self-pay

## 2019-12-22 DIAGNOSIS — M545 Low back pain, unspecified: Secondary | ICD-10-CM

## 2019-12-22 DIAGNOSIS — R293 Abnormal posture: Secondary | ICD-10-CM

## 2019-12-22 DIAGNOSIS — R2689 Other abnormalities of gait and mobility: Secondary | ICD-10-CM

## 2019-12-22 NOTE — Patient Instructions (Signed)
Access Code: [email or web address] URL: [email or web address] Date: 12/22/2019 Prepared by: Josue Hector  Exercises Supine Transversus Abdominis Bracing - Hands on Stomach - 1-2 x daily - 7 x weekly - 2 sets - 10 reps - 5 sec hold Supine Bridge - 1-2 x daily - 7 x weekly - 2 sets - 10 reps - 5 sec hold Active Straight Leg Raise with Quad Set - 1-2 x daily - 7 x weekly - 2 sets - 10 reps Sidelying Hip Abduction - 1-2 x daily - 7 x weekly - 2 sets - 10 reps Prone Press Up - 1-2 x daily - 7 x weekly - 2 sets - 10 reps Dead Bug - 1-2 x daily - 7 x weekly - 2 sets - 10 reps

## 2019-12-22 NOTE — Therapy (Signed)
[place], [place], [address] Phone: [phone]   Fax:  [phone]  Physical Therapy Treatment  Patient Details  Name: Carrie Cook MRN: [id number] Date of Birth: 11/13/1960 Referring Provider (PT): Arther Abbott, MD   Encounter Date: 12/22/2019   PT End of Session - 12/22/19 0958    Visit Number 7    Number of Visits 18    Date for PT Re-Evaluation 01/09/20    Authorization Type Medicare Part A    Progress Note Due on Visit 10    PT Start Time [phone]    PT Stop Time 1031    PT Time Calculation (min) 38 min    Activity Tolerance Patient tolerated treatment well    Behavior During Therapy [facility] for tasks assessed/performed           Past Medical History:  Diagnosis Date  . Anemia    PT HAS HAD COLONOSCOPY AND ENDOSCOPY WORK UP - NO PROBLEMS FOUND AS SOURCE OF ANEMIA -- PT HAD IRON INFUSION AT [facility] 11/13/12-AFTER SEEING HEMATOLOGIST DR. Marinette AND HE GAVE HEMATOLOGIC CLEARANCE FOR GASTRIC BYPASS SURGERY.  Marland Kitchen Anxiety   . Asthma    daily and prn inhalers  . Degenerative joint disease    right knee, spine - STATES INTERMITTENT  NUMBNESS DOWN RT LEG WITH PROLONGED STANDING OR WALKING- THINKS RELATED TO HER SPINE PROBLEMS  . Dental crowns present   . Depression   . Family history of anesthesia complication    pt's mother and sister have hx. of post-op N/V  . Fibroids    UTERINE  . Gastroesophageal reflux disease    none for 2 years since Bariatric surgery.  . H/O hiatal hernia   . History of endometriosis   . History of kidney stones   . Hyperlipidemia   . Hypertension    under control with med., has been on med. x 2 yr.  . Insulin dependent diabetes mellitus   . Lock jaw    jaw locks open if opens mouth wide  . Migraines   . Neuropathy    FEET  . Obesity   . Palpitations   . PONV (postoperative nausea and vomiting)   . Shortness of breath    with daily activities  . Sleep apnea     post-op didn't need CPAP but now states needs it again but not using    Past Surgical History:  Procedure Laterality Date  . ABDOMINAL HYSTERECTOMY N/A 06/06/2017   Procedure: HYSTERECTOMY ABDOMINAL;  Surgeon: Florian Buff, MD;  [facility];  Service: Gynecology;  Laterality: N/A;  . BREATH TEK H PYLORI N/A 08/13/2012   Procedure: BREATH TEK H PYLORI;  Surgeon: Edward Jolly, MD;  Location[facility];  Service: General;  Laterality: N/A;  . CARPAL TUNNEL RELEASE  01/03/2012   Procedure: CARPAL TUNNEL RELEASE;  Surgeon: Wynonia Sours, MD;  Location: [facility];  Service: Orthopedics;  Laterality: Right;  . carpal tunnel release right hand Right 12/13  . COLONOSCOPY  05/2009   [id number] hemorrhoids otherwise normal colon, rectum and terminal ileum  . COLONOSCOPY WITH ESOPHAGOGASTRODUODENOSCOPY (EGD) N/A 10/28/2012   Procedure: COLONOSCOPY WITH ESOPHAGOGASTRODUODENOSCOPY (EGD);  Surgeon: Daneil Dolin, MD;  [facility];  Service: Endoscopy;  Laterality: N/A;  7:30  . DILITATION & CURRETTAGE/HYSTROSCOPY WITH NOVASURE ABLATION N/A 07/22/2014   Procedure: DILATATION & CURETTAGE/HYSTEROSCOPY WITH NOVASURE ABLATION; uterine length 6.0 cm; uterine width 3.8 cm; total  ablation time 1 minute 15 seconds;  Surgeon: Florian Buff, MD;  [facility];  Service: Gynecology;  Laterality: N/A;  . ESOPHAGOGASTRODUODENOSCOPY  5/013/2011   [id number] esophagus/multiple polyps removed s/p (hyperplastic). Gastritis without H.pylori.  . ESOPHAGOGASTRODUODENOSCOPY (EGD) WITH PROPOFOL N/A 06/06/2018   with LA Grade A esophagitis s/p dilation.   Marland Kitchen GASTRIC ROUX-EN-Y N/A 11/19/2012   Procedure: LAPAROSCOPIC ROUX-EN-Y GASTRIC;  Surgeon: Edward Jolly, MD;  [facility];  Service: General;  Laterality: N/A;  . GIVENS CAPSULE STUDY N/A 10/28/2012   Procedure: GIVENS CAPSULE STUDY;  Surgeon: Daneil Dolin, MD;  [facility];  Service: Endoscopy;  Laterality:  N/A;  . KNEE ARTHROSCOPY WITH MEDIAL MENISECTOMY Right 10/28/2015   Procedure: RIGHT KNEE ARTHROSCOPY WITH MEDIAL MENISECTOMY;  Surgeon: Carole Civil, MD;  [facility];  Service: Orthopedics;  Laterality: Right;  . LAPAROSCOPY     due to endometriosis  . MALONEY DILATION N/A 06/06/2018   Procedure: Venia Minks DILATION;  Surgeon: Daneil Dolin, MD;  [facility];  Service: Endoscopy;  Laterality: N/A;  . PELVIC LAPAROSCOPY  11/04/1999   with fulguration of endometriosis  . SALPINGOOPHORECTOMY Bilateral 06/06/2017   Procedure: SALPINGO OOPHORECTOMY;  Surgeon: Florian Buff, MD;  [facility];  Service: Gynecology;  Laterality: Bilateral;  . TOTAL KNEE ARTHROPLASTY Right 07/22/2019   Procedure: TOTAL KNEE ARTHROPLASTY;  Surgeon: Carole Civil, MD;  [facility];  Service: Orthopedics;  Laterality: Right;  . TRIGGER FINGER RELEASE Right 09/24/2012   Procedure: RELEASE A-1 PULLEY OF RIGHT THUMB;  Surgeon: Wynonia Sours, MD;  Location: [facility];  Service: Orthopedics;  Laterality: Right;  . TUBAL LIGATION  1993    There were no vitals filed for this visit.   Subjective Assessment - 12/22/19 0957    Subjective Patient says she had a rough couple days. Says she had company over the weekend and did a lot of lifting involved with cooking for Thanksgiving.    Pertinent History RT TKA    Limitations Standing;Lifting;Walking;House hold activities    How long can you sit comfortably? 30 minutes    How long can you stand comfortably? 20 minutes    How long can you walk comfortably? no issues    Diagnostic tests xrays    Patient Stated Goals decrease pain    Currently in Pain? Yes    Pain Score 4     Pain Location Back    Pain Orientation Posterior;Lower    Pain Descriptors / Indicators Burning    Pain Type Acute pain    Pain Onset More than a month ago    Pain Frequency Constant                             [facility] Adult PT  Treatment/Exercise - 12/22/19 0001      Lumbar Exercises: Standing   Functional Squats 5 reps      Lumbar Exercises: Supine   Ab Set 10 reps;5 seconds    Bent Knee Raise 20 reps    Dead Bug 15 reps    Bridge 10 reps;5 seconds    Straight Leg Raise 10 reps    Other Supine Lumbar Exercises prone press up x10 (better); press up with exhale x 10 (no different)      Lumbar Exercises: Sidelying   Hip Abduction Both;15 reps  PT Short Term Goals - 12/15/19 1104      PT SHORT TERM GOAL #1   Title Patient will be independent with initial HEP and self-management strategies to improve functional outcomes    Time 3    Period Weeks    Status On-going    Target Date 12/19/19             PT Long Term Goals - 12/15/19 1104      PT LONG TERM GOAL #1   Title Patient will improve FOTO score by at least 10% to indicate improvement in functional outcomes    Time 6    Period Weeks    Status On-going      PT LONG TERM GOAL #2   Title Patient will report at least 75% overall improvement in subjective complaint to indicate improvement in ability to perform ADLs.    Time 6    Period Weeks    Status On-going      PT LONG TERM GOAL #3   Title Patient will have equal to or > 4+/5 MMT throughout BLE to improve ability to perform functional mobility, stair ambulation and ADLs.    Time 6    Period Weeks    Status On-going      PT LONG TERM GOAL #4   Title Patient will be independent with initial HEP and self-management strategies to improve functional outcomes    Time 6    Period Weeks    Status On-going                 Plan - 12/22/19 1028    Clinical Impression Statement Patient tolerated session well overall but notes burning sensation in lower lumbar at beginning of session. She states this happens when she squats to the floor. Therapist observed squatting form and corrected posturing, burning symptoms decrease. Patient uneducated on findings and  purpose of targeted ther ex to improve involved musculature. Progressed prone press ups with exhale, but no further improvement in lumbar pain noted. Progressed core strength with added dead bugs. Patient required verbal cues for proper form and limb positioning. Patient educated on and issued updated HEP handout including prior ther ex as she states she has lost hers.    Examination-Activity Limitations Locomotion Level;Transfers;Squat;Bend;Sleep    Examination-Participation Restrictions Community Activity;Valla Leaver [facility]    Stability/Clinical Decision Making Stable/Uncomplicated    Rehab Potential Good    PT Frequency 3x / week    PT Duration 6 weeks    PT Treatment/Interventions ADLs/Self Care Home Management;Aquatic Therapy;Biofeedback;Cryotherapy;Electrical Stimulation;Moist Heat;DME Instruction;Gait training;Stair training;Functional mobility training;Therapeutic activities;Therapeutic exercise;Balance training;Neuromuscular re-education;Patient/family education;Manual techniques;Scar mobilization;Passive range of motion;Dry needling;Taping;Joint Manipulations    PT Next Visit Plan Assess reponse to HEP. Focus on core and glute strengthening for improved lumbar stabilization. Add manuals to address pain and restricitons as needed.    PT Home Exercise Plan 11/24/19: ab set, bridge, piriformis stretch 11/10 POE, press up, standing ext; 11/12:  sit to stand, sitting tall, abdominal activation with scapular retraction while sitting; 12/15/19 - supine and prone SLR w/ ab set 12/22/19: sidelying hip abd, deadbugs    Consulted and Agree with Plan of Care Patient           Patient will benefit from skilled therapeutic intervention in order to improve the following deficits and impairments:  Postural dysfunction, Pain, Decreased activity tolerance, Decreased strength, Impaired perceived functional ability, Improper body mechanics  Visit Diagnosis: Low back pain, unspecified back pain laterality,  unspecified chronicity, unspecified whether  sciatica present  Abnormal posture  Other abnormalities of gait and mobility     Problem List Patient Active Problem List   Diagnosis Date Noted  . Acute right-sided back pain with sciatica 10/02/2019  . S/P total knee replacement, right 07/22/19  09/01/2019  . Primary localized osteoarthritis of left knee 07/22/2019  . Primary osteoarthritis of right knee   . Constipation 09/05/2018  . Abdominal pain, chronic, epigastric 05/27/2018  . Dysphagia 05/27/2018  . Depression, major, single episode, moderate ([place]) 03/22/2018  . S/P complete hysterectomy 06/06/2017  . Derangement of posterior horn of medial meniscus of right knee   . Right knee pain 09/10/2015  . Iron deficiency anemia 10/16/2013  . Fibroids 08/21/2013  . Excessive or frequent menstruation 06/19/2013  . Morbid obesity ([place]) 07/12/2012  . Obstructive sleep apnea 06/21/2012  . Chest pain   . Hyperlipidemia   . Type 2 diabetes mellitus ([place])   . Obesity   . Laboratory test   . Nephrolithiasis   . Asthma   . Endometriosis   . Hypertension   . Iron deficiency anemia 05/11/2009  . GERD 05/11/2009    10:32 AM, 12/22/19 Josue Hector PT DPT  Physical Therapist with [facility]  [phone] [place] [address] [place], [place], [address] Phone: [phone]   Fax:  [phone]  Name: Carrie Cook MRN: [id number] Date of Birth: [birth year]

## 2019-12-24 ENCOUNTER — Other Ambulatory Visit: Payer: Self-pay

## 2019-12-24 ENCOUNTER — Ambulatory Visit (HOSPITAL_COMMUNITY): Payer: Medicare Other | Attending: Orthopedic Surgery | Admitting: Physical Therapy

## 2019-12-24 ENCOUNTER — Encounter (HOSPITAL_COMMUNITY): Payer: Self-pay | Admitting: Physical Therapy

## 2019-12-24 DIAGNOSIS — R2689 Other abnormalities of gait and mobility: Secondary | ICD-10-CM | POA: Insufficient documentation

## 2019-12-24 DIAGNOSIS — R293 Abnormal posture: Secondary | ICD-10-CM | POA: Insufficient documentation

## 2019-12-24 DIAGNOSIS — M545 Low back pain, unspecified: Secondary | ICD-10-CM | POA: Insufficient documentation

## 2019-12-24 NOTE — Therapy (Signed)
[place], [place], [address] Phone: [phone]   Fax:  [phone]  Physical Therapy Treatment  Patient Details  Name: Carrie Cook MRN: [id number] Date of Birth: 1960-09-03 Referring Provider (PT): Arther Abbott, MD   Encounter Date: 12/24/2019   PT End of Session - 12/24/19 1127    Visit Number 8    Number of Visits 18    Date for PT Re-Evaluation 01/09/20    Authorization Type Medicare Part A    Progress Note Due on Visit 10    PT Start Time 1120    PT Stop Time 1205    PT Time Calculation (min) 45 min    Activity Tolerance Patient tolerated treatment well    Behavior During Therapy [facility] for tasks assessed/performed           Past Medical History:  Diagnosis Date  . Anemia    PT HAS HAD COLONOSCOPY AND ENDOSCOPY WORK UP - NO PROBLEMS FOUND AS SOURCE OF ANEMIA -- PT HAD IRON INFUSION AT [facility] 11/13/12-AFTER SEEING HEMATOLOGIST DR. Collinston AND HE GAVE HEMATOLOGIC CLEARANCE FOR GASTRIC BYPASS SURGERY.  Marland Kitchen Anxiety   . Asthma    daily and prn inhalers  . Degenerative joint disease    right knee, spine - STATES INTERMITTENT  NUMBNESS DOWN RT LEG WITH PROLONGED STANDING OR WALKING- THINKS RELATED TO HER SPINE PROBLEMS  . Dental crowns present   . Depression   . Family history of anesthesia complication    pt's mother and sister have hx. of post-op N/V  . Fibroids    UTERINE  . Gastroesophageal reflux disease    none for 2 years since Bariatric surgery.  . H/O hiatal hernia   . History of endometriosis   . History of kidney stones   . Hyperlipidemia   . Hypertension    under control with med., has been on med. x 2 yr.  . Insulin dependent diabetes mellitus   . Lock jaw    jaw locks open if opens mouth wide  . Migraines   . Neuropathy    FEET  . Obesity   . Palpitations   . PONV (postoperative nausea and vomiting)   . Shortness of breath    with daily activities  . Sleep apnea     post-op didn't need CPAP but now states needs it again but not using    Past Surgical History:  Procedure Laterality Date  . ABDOMINAL HYSTERECTOMY N/A 06/06/2017   Procedure: HYSTERECTOMY ABDOMINAL;  Surgeon: Florian Buff, MD;  [facility];  Service: Gynecology;  Laterality: N/A;  . BREATH TEK H PYLORI N/A 08/13/2012   Procedure: BREATH TEK H PYLORI;  Surgeon: Edward Jolly, MD;  [facility];  Service: General;  Laterality: N/A;  . CARPAL TUNNEL RELEASE  01/03/2012   Procedure: CARPAL TUNNEL RELEASE;  Surgeon: Wynonia Sours, MD;  Location: [place];  Service: Orthopedics;  Laterality: Right;  . carpal tunnel release right hand Right 12/13  . COLONOSCOPY  05/2009   [id number] hemorrhoids otherwise normal colon, rectum and terminal ileum  . COLONOSCOPY WITH ESOPHAGOGASTRODUODENOSCOPY (EGD) N/A 10/28/2012   Procedure: COLONOSCOPY WITH ESOPHAGOGASTRODUODENOSCOPY (EGD);  Surgeon: Daneil Dolin, MD;  [facility];  Service: Endoscopy;  Laterality: N/A;  7:30  . DILITATION & CURRETTAGE/HYSTROSCOPY WITH NOVASURE ABLATION N/A 07/22/2014   Procedure: DILATATION & CURETTAGE/HYSTEROSCOPY WITH NOVASURE ABLATION; uterine length 6.0 cm; uterine width 3.8 cm; total  ablation time 1 minute 15 seconds;  Surgeon: Florian Buff, MD;  [facility];  Service: Gynecology;  Laterality: N/A;  . ESOPHAGOGASTRODUODENOSCOPY  5/013/2011   SHF:[id number] esophagus/multiple polyps removed s/p (hyperplastic). Gastritis without H.pylori.  . ESOPHAGOGASTRODUODENOSCOPY (EGD) WITH PROPOFOL N/A 06/06/2018   with LA Grade A esophagitis s/p dilation.   Marland Kitchen GASTRIC ROUX-EN-Y N/A 11/19/2012   Procedure: LAPAROSCOPIC ROUX-EN-Y GASTRIC;  Surgeon: Edward Jolly, MD;  [facility];  Service: General;  Laterality: N/A;  . GIVENS CAPSULE STUDY N/A 10/28/2012   Procedure: GIVENS CAPSULE STUDY;  Surgeon: Daneil Dolin, MD;  [facility];  Service: Endoscopy;  Laterality:  N/A;  . KNEE ARTHROSCOPY WITH MEDIAL MENISECTOMY Right 10/28/2015   Procedure: RIGHT KNEE ARTHROSCOPY WITH MEDIAL MENISECTOMY;  Surgeon: Carole Civil, MD;  [facility];  Service: Orthopedics;  Laterality: Right;  . LAPAROSCOPY     due to endometriosis  . MALONEY DILATION N/A 06/06/2018   Procedure: Venia Minks DILATION;  Surgeon: Daneil Dolin, MD;  [facility];  Service: Endoscopy;  Laterality: N/A;  . PELVIC LAPAROSCOPY  11/04/1999   with fulguration of endometriosis  . SALPINGOOPHORECTOMY Bilateral 06/06/2017   Procedure: SALPINGO OOPHORECTOMY;  Surgeon: Florian Buff, MD;  [facility];  Service: Gynecology;  Laterality: Bilateral;  . TOTAL KNEE ARTHROPLASTY Right 07/22/2019   Procedure: TOTAL KNEE ARTHROPLASTY;  Surgeon: Carole Civil, MD;  [facility];  Service: Orthopedics;  Laterality: Right;  . TRIGGER FINGER RELEASE Right 09/24/2012   Procedure: RELEASE A-1 PULLEY OF RIGHT THUMB;  Surgeon: Wynonia Sours, MD;  Location: [place];  Service: Orthopedics;  Laterality: Right;  . TUBAL LIGATION  1993    There were no vitals filed for this visit.   Subjective Assessment - 12/24/19 1128    Subjective Patient says burning is more widespread. Feels like an electric jolt. Says she has done nothing differently in recent days. Says she feels better walking around, hurts when she bends and sits down.    Pertinent History RT TKA    Limitations Standing;Lifting;Walking;House hold activities    How long can you sit comfortably? 30 minutes    How long can you stand comfortably? 20 minutes    How long can you walk comfortably? no issues    Diagnostic tests xrays    Patient Stated Goals decrease pain    Currently in Pain? Yes    Pain Score 7     Pain Location Back    Pain Orientation Posterior;Lower    Pain Descriptors / Indicators Burning    Pain Type Acute pain    Pain Onset More than a month ago    Pain Frequency Constant                              [facility] Adult PT Treatment/Exercise - 12/24/19 0001      Lumbar Exercises: Stretches   Prone on Elbows Stretch 2 reps;60 seconds    Press Ups 2 reps;10 reps      Lumbar Exercises: Supine   Ab Set 10 reps;5 seconds    Bent Knee Raise 20 reps    Bridge 10 reps;5 seconds    Other Supine Lumbar Exercises  press up with exhale x 10      Manual Therapy   Manual Therapy Soft tissue mobilization    Manual therapy comments Manual complete separate than rest of tx  Soft tissue mobilization IASTM using message gun on LV 10 to bilateral lumbar paraspinals with patient in prone                     PT Short Term Goals - 12/15/19 1104      PT SHORT TERM GOAL #1   Title Patient will be independent with initial HEP and self-management strategies to improve functional outcomes    Time 3    Period Weeks    Status On-going    Target Date 12/19/19             PT Long Term Goals - 12/15/19 1104      PT LONG TERM GOAL #1   Title Patient will improve FOTO score by at least 10% to indicate improvement in functional outcomes    Time 6    Period Weeks    Status On-going      PT LONG TERM GOAL #2   Title Patient will report at least 75% overall improvement in subjective complaint to indicate improvement in ability to perform ADLs.    Time 6    Period Weeks    Status On-going      PT LONG TERM GOAL #3   Title Patient will have equal to or > 4+/5 MMT throughout BLE to improve ability to perform functional mobility, stair ambulation and ADLs.    Time 6    Period Weeks    Status On-going      PT LONG TERM GOAL #4   Title Patient will be independent with initial HEP and self-management strategies to improve functional outcomes    Time 6    Period Weeks    Status On-going                 Plan - 12/24/19 1221    Clinical Impression Statement Patient with elevated low back pain at beginning of session. Worked through extension-based  progression of force with lumbar extension exercises. Patient reports pain reduced to 1/10. Performed core strength exercise progressions and hip bridging. Patient notes some discomfort with bridging. Educated patient on spine anatomy, mechanism of extension-based exercise, improving seated and driving posture and on modified HEP to consist only of ab set, bent knee raise and dead bugs as tolerated until next visit. Added manual IASTM using message gun to address pain and restrictions. Patient reports pain reduced to 0.5/10 post session.    Examination-Activity Limitations Locomotion Level;Transfers;Squat;Bend;Sleep    Examination-Participation Restrictions Community Activity;[facility]    Stability/Clinical Decision Making Stable/Uncomplicated    Rehab Potential Good    PT Frequency 3x / week    PT Duration 6 weeks    PT Treatment/Interventions ADLs/Self Care Home Management;Aquatic Therapy;Biofeedback;Cryotherapy;Electrical Stimulation;Moist Heat;DME Instruction;Gait training;Stair training;Functional mobility training;Therapeutic activities;Therapeutic exercise;Balance training;Neuromuscular re-education;Patient/family education;Manual techniques;Scar mobilization;Passive range of motion;Dry needling;Taping;Joint Manipulations    PT Next Visit Plan Assess reponse to modified HEP. Focus on core and glute strengthening for improved lumbar stabilization. Add manuals to address pain and restricitons as needed.    PT Home Exercise Plan 11/24/19: ab set, bridge, piriformis stretch 11/10 POE, press up, standing ext; 11/12:  sit to stand, sitting tall, abdominal activation with scapular retraction while sitting; 12/15/19 - supine and prone SLR w/ ab set 12/22/19: sidelying hip abd, deadbugs    Consulted and Agree with Plan of Care Patient           Patient will benefit from skilled therapeutic intervention in order to improve the following deficits  and impairments:  Postural dysfunction, Pain,  Decreased activity tolerance, Decreased strength, Impaired perceived functional ability, Improper body mechanics  Visit Diagnosis: Low back pain, unspecified back pain laterality, unspecified chronicity, unspecified whether sciatica present  Abnormal posture  Other abnormalities of gait and mobility     Problem List Patient Active Problem List   Diagnosis Date Noted  . Acute right-sided back pain with sciatica 10/02/2019  . S/P total knee replacement, right 07/22/19  09/01/2019  . Primary localized osteoarthritis of left knee 07/22/2019  . Primary osteoarthritis of right knee   . Constipation 09/05/2018  . Abdominal pain, chronic, epigastric 05/27/2018  . Dysphagia 05/27/2018  . Depression, major, single episode, moderate ([place]) 03/22/2018  . S/P complete hysterectomy 06/06/2017  . Derangement of posterior horn of medial meniscus of right knee   . Right knee pain 09/10/2015  . Iron deficiency anemia 10/16/2013  . Fibroids 08/21/2013  . Excessive or frequent menstruation 06/19/2013  . Morbid obesity ([place]) 07/12/2012  . Obstructive sleep apnea 06/21/2012  . Chest pain   . Hyperlipidemia   . Type 2 diabetes mellitus ([place])   . Obesity   . Laboratory test   . Nephrolithiasis   . Asthma   . Endometriosis   . Hypertension   . Iron deficiency anemia 05/11/2009  . GERD 05/11/2009    12:25 PM, 12/24/19 Josue Hector PT DPT  Physical Therapist with [facility]  [phone] [place] [address] [place], [place], [address] Phone: [phone]   Fax:  [phone]  Name: SANIAH SCHROETER MRN: [id number] Date of Birth: 1960/07/06

## 2019-12-29 ENCOUNTER — Encounter (HOSPITAL_COMMUNITY): Payer: Self-pay | Admitting: Physical Therapy

## 2019-12-29 ENCOUNTER — Other Ambulatory Visit: Payer: Self-pay

## 2019-12-29 ENCOUNTER — Ambulatory Visit (HOSPITAL_COMMUNITY): Payer: Medicare Other | Admitting: Physical Therapy

## 2019-12-29 DIAGNOSIS — M545 Low back pain, unspecified: Secondary | ICD-10-CM | POA: Diagnosis not present

## 2019-12-29 DIAGNOSIS — R293 Abnormal posture: Secondary | ICD-10-CM

## 2019-12-29 DIAGNOSIS — R2689 Other abnormalities of gait and mobility: Secondary | ICD-10-CM

## 2019-12-29 NOTE — Therapy (Signed)
[place], [place], [address] Phone: [phone]   Fax:  [phone]  Physical Therapy Treatment  Patient Details  Name: Carrie Cook MRN: [id number] Date of Birth: [birth year] Referring Provider (PT): Arther Abbott, MD   Encounter Date: 12/29/2019   PT End of Session - 12/29/19 1054    Visit Number 9    Number of Visits 18    Date for PT Re-Evaluation 01/09/20    Authorization Type Medicare Part A    Progress Note Due on Visit 10    PT Start Time 9381    PT Stop Time 1122    PT Time Calculation (min) 42 min    Activity Tolerance Patient tolerated treatment well    Behavior During Therapy [facility] for tasks assessed/performed           Past Medical History:  Diagnosis Date  . Anemia    PT HAS HAD COLONOSCOPY AND ENDOSCOPY WORK UP - NO PROBLEMS FOUND AS SOURCE OF ANEMIA -- PT HAD IRON INFUSION AT [facility] 11/13/12-AFTER SEEING HEMATOLOGIST DR. Virgil AND HE GAVE HEMATOLOGIC CLEARANCE FOR GASTRIC BYPASS SURGERY.  Marland Kitchen Anxiety   . Asthma    daily and prn inhalers  . Degenerative joint disease    right knee, spine - STATES INTERMITTENT  NUMBNESS DOWN RT LEG WITH PROLONGED STANDING OR WALKING- THINKS RELATED TO HER SPINE PROBLEMS  . Dental crowns present   . Depression   . Family history of anesthesia complication    pt's mother and sister have hx. of post-op N/V  . Fibroids    UTERINE  . Gastroesophageal reflux disease    none for 2 years since Bariatric surgery.  . H/O hiatal hernia   . History of endometriosis   . History of kidney stones   . Hyperlipidemia   . Hypertension    under control with med., has been on med. x 2 yr.  . Insulin dependent diabetes mellitus   . Lock jaw    jaw locks open if opens mouth wide  . Migraines   . Neuropathy    FEET  . Obesity   . Palpitations   . PONV (postoperative nausea and vomiting)   . Shortness of breath    with daily activities  . Sleep apnea     post-op didn't need CPAP but now states needs it again but not using    Past Surgical History:  Procedure Laterality Date  . ABDOMINAL HYSTERECTOMY N/A 06/06/2017   Procedure: HYSTERECTOMY ABDOMINAL;  Surgeon: Florian Buff, MD;  [facility];  Service: Gynecology;  Laterality: N/A;  . BREATH TEK H PYLORI N/A 08/13/2012   Procedure: BREATH TEK H PYLORI;  Surgeon: Edward Jolly, MD;  [facility];  Service: General;  Laterality: N/A;  . CARPAL TUNNEL RELEASE  01/03/2012   Procedure: CARPAL TUNNEL RELEASE;  Surgeon: Wynonia Sours, MD;  Location: [place];  Service: Orthopedics;  Laterality: Right;  . carpal tunnel release right hand Right 12/13  . COLONOSCOPY  05/2009   [id number] hemorrhoids otherwise normal colon, rectum and terminal ileum  . COLONOSCOPY WITH ESOPHAGOGASTRODUODENOSCOPY (EGD) N/A 10/28/2012   Procedure: COLONOSCOPY WITH ESOPHAGOGASTRODUODENOSCOPY (EGD);  Surgeon: Daneil Dolin, MD;  [facility];  Service: Endoscopy;  Laterality: N/A;  7:30  . DILITATION & CURRETTAGE/HYSTROSCOPY WITH NOVASURE ABLATION N/A 07/22/2014   Procedure: DILATATION & CURETTAGE/HYSTEROSCOPY WITH NOVASURE ABLATION; uterine length 6.0 cm; uterine width 3.8 cm; total  ablation time 1 minute 15 seconds;  Surgeon: Florian Buff, MD;  [facility];  Service: Gynecology;  Laterality: N/A;  . ESOPHAGOGASTRODUODENOSCOPY  5/013/2011   [id number] esophagus/multiple polyps removed s/p (hyperplastic). Gastritis without H.pylori.  . ESOPHAGOGASTRODUODENOSCOPY (EGD) WITH PROPOFOL N/A 06/06/2018   with LA Grade A esophagitis s/p dilation.   Marland Kitchen GASTRIC ROUX-EN-Y N/A 11/19/2012   Procedure: LAPAROSCOPIC ROUX-EN-Y GASTRIC;  Surgeon: Edward Jolly, MD;  [facility];  Service: General;  Laterality: N/A;  . GIVENS CAPSULE STUDY N/A 10/28/2012   Procedure: GIVENS CAPSULE STUDY;  Surgeon: Daneil Dolin, MD;  [facility];  Service: Endoscopy;  Laterality:  N/A;  . KNEE ARTHROSCOPY WITH MEDIAL MENISECTOMY Right 10/28/2015   Procedure: RIGHT KNEE ARTHROSCOPY WITH MEDIAL MENISECTOMY;  Surgeon: Carole Civil, MD;  [facility];  Service: Orthopedics;  Laterality: Right;  . LAPAROSCOPY     due to endometriosis  . MALONEY DILATION N/A 06/06/2018   Procedure: Venia Minks DILATION;  Surgeon: Daneil Dolin, MD;  [facility];  Service: Endoscopy;  Laterality: N/A;  . PELVIC LAPAROSCOPY  11/04/1999   with fulguration of endometriosis  . SALPINGOOPHORECTOMY Bilateral 06/06/2017   Procedure: SALPINGO OOPHORECTOMY;  Surgeon: Florian Buff, MD;  [facility];  Service: Gynecology;  Laterality: Bilateral;  . TOTAL KNEE ARTHROPLASTY Right 07/22/2019   Procedure: TOTAL KNEE ARTHROPLASTY;  Surgeon: Carole Civil, MD;  [facility];  Service: Orthopedics;  Laterality: Right;  . TRIGGER FINGER RELEASE Right 09/24/2012   Procedure: RELEASE A-1 PULLEY OF RIGHT THUMB;  Surgeon: Wynonia Sours, MD;  Location: [place];  Service: Orthopedics;  Laterality: Right;  . TUBAL LIGATION  1993    There were no vitals filed for this visit.   Subjective Assessment - 12/29/19 1054    Subjective Patient says she feels a little better today. Back has hurt off and on over the weekend. Feels best laying down on back. Was not able to complete HEP regularly since last visit. Says she feels ok for 10 minutes when standing but then pain sets in, shifts from RT to LT.    Pertinent History RT TKA    Limitations Standing;Lifting;Walking;House hold activities    How long can you sit comfortably? 30 minutes    How long can you stand comfortably? 20 minutes    How long can you walk comfortably? no issues    Diagnostic tests xrays    Patient Stated Goals decrease pain    Currently in Pain? Yes    Pain Score 2     Pain Location Back    Pain Orientation Posterior;Lower    Pain Descriptors / Indicators Burning    Pain Type Acute pain    Pain  Onset More than a month ago    Pain Frequency Constant                             [facility] Adult PT Treatment/Exercise - 12/29/19 0001      Lumbar Exercises: Stretches   Prone on Elbows Stretch 2 reps;60 seconds    Press Ups 1 rep;10 reps      Lumbar Exercises: Supine   Ab Set 15 reps;5 seconds    Bent Knee Raise 20 reps    Dead Bug 10 reps    Straight Leg Raise 20 reps   2 x 10 with ab set    Other Supine Lumbar Exercises  press up with exhale x 10      Manual Therapy   Manual Therapy Soft tissue mobilization    Manual therapy comments Manual complete separate than rest of tx    Soft tissue mobilization IASTM using message gun on LV 10 to bilateral lumbar paraspinals with patient in prone                     PT Short Term Goals - 12/15/19 1104      PT SHORT TERM GOAL #1   Title Patient will be independent with initial HEP and self-management strategies to improve functional outcomes    Time 3    Period Weeks    Status On-going    Target Date 12/19/19             PT Long Term Goals - 12/15/19 1104      PT LONG TERM GOAL #1   Title Patient will improve FOTO score by at least 10% to indicate improvement in functional outcomes    Time 6    Period Weeks    Status On-going      PT LONG TERM GOAL #2   Title Patient will report at least 75% overall improvement in subjective complaint to indicate improvement in ability to perform ADLs.    Time 6    Period Weeks    Status On-going      PT LONG TERM GOAL #3   Title Patient will have equal to or > 4+/5 MMT throughout BLE to improve ability to perform functional mobility, stair ambulation and ADLs.    Time 6    Period Weeks    Status On-going      PT LONG TERM GOAL #4   Title Patient will be independent with initial HEP and self-management strategies to improve functional outcomes    Time 6    Period Weeks    Status On-going                 Plan - 12/29/19 1122    Clinical  Impression Statement Patient had questions at beginning of session regarding ongoing back pain and therapy progress. Answered all patient questions, and reviewed therapy HEP and POC. Discussed spine anatomy and time frame for expectations with therapy exercises. Patient showing some improvement in carry over with HEP exercise, despite reported noncompliance over the weekend. Patient required verbal cues for TA activation during SLR. Discussed importance of TA bracing with functional activity for reducing back pain. Patient reports decreased back pain with prone press ups/ press up with exhale from 2/10 to 1/10 pain. Performed manual using theragun to address pain and restriction in lumbar. Patient tolerated this well.    Examination-Activity Limitations Locomotion Level;Transfers;Squat;Bend;Sleep    Examination-Participation Restrictions Community Activity;[facility]    Stability/Clinical Decision Making Stable/Uncomplicated    Rehab Potential Good    PT Frequency 3x / week    PT Duration 6 weeks    PT Treatment/Interventions ADLs/Self Care Home Management;Aquatic Therapy;Biofeedback;Cryotherapy;Electrical Stimulation;Moist Heat;DME Instruction;Gait training;Stair training;Functional mobility training;Therapeutic activities;Therapeutic exercise;Balance training;Neuromuscular re-education;Patient/family education;Manual techniques;Scar mobilization;Passive range of motion;Dry needling;Taping;Joint Manipulations    PT Next Visit Plan 10th visit progress note next visit    PT Home Exercise Plan 11/24/19: ab set, bridge, piriformis stretch 11/10 POE, press up, standing ext; 11/12:  sit to stand, sitting tall, abdominal activation with scapular retraction while sitting; 12/15/19 - supine and prone SLR w/ ab set 12/22/19: sidelying hip abd, deadbugs    Consulted and Agree  with Plan of Care Patient           Patient will benefit from skilled therapeutic intervention in order to improve the following  deficits and impairments:  Postural dysfunction, Pain, Decreased activity tolerance, Decreased strength, Impaired perceived functional ability, Improper body mechanics  Visit Diagnosis: Low back pain, unspecified back pain laterality, unspecified chronicity, unspecified whether sciatica present  Abnormal posture  Other abnormalities of gait and mobility     Problem List Patient Active Problem List   Diagnosis Date Noted  . Acute right-sided back pain with sciatica 10/02/2019  . S/P total knee replacement, right 07/22/19  09/01/2019  . Primary localized osteoarthritis of left knee 07/22/2019  . Primary osteoarthritis of right knee   . Constipation 09/05/2018  . Abdominal pain, chronic, epigastric 05/27/2018  . Dysphagia 05/27/2018  . Depression, major, single episode, moderate ([place]) 03/22/2018  . S/P complete hysterectomy 06/06/2017  . Derangement of posterior horn of medial meniscus of right knee   . Right knee pain 09/10/2015  . Iron deficiency anemia 10/16/2013  . Fibroids 08/21/2013  . Excessive or frequent menstruation 06/19/2013  . Morbid obesity ([place]) 07/12/2012  . Obstructive sleep apnea 06/21/2012  . Chest pain   . Hyperlipidemia   . Type 2 diabetes mellitus ([place])   . Obesity   . Laboratory test   . Nephrolithiasis   . Asthma   . Endometriosis   . Hypertension   . Iron deficiency anemia 05/11/2009  . GERD 05/11/2009    11:24 AM, 12/29/19 Josue Hector PT DPT  Physical Therapist with [facility]  [phone] [place] [address] [place], [place], [address] Phone: [phone]   Fax:  [phone]  Name: Carrie Cook MRN: [id number] Date of Birth: [birth year]

## 2019-12-31 ENCOUNTER — Ambulatory Visit (HOSPITAL_COMMUNITY): Payer: Medicare Other | Admitting: Physical Therapy

## 2019-12-31 ENCOUNTER — Encounter (HOSPITAL_COMMUNITY): Payer: Self-pay | Admitting: Physical Therapy

## 2019-12-31 ENCOUNTER — Other Ambulatory Visit: Payer: Self-pay

## 2019-12-31 DIAGNOSIS — R293 Abnormal posture: Secondary | ICD-10-CM

## 2019-12-31 DIAGNOSIS — M545 Low back pain, unspecified: Secondary | ICD-10-CM | POA: Diagnosis not present

## 2019-12-31 DIAGNOSIS — R2689 Other abnormalities of gait and mobility: Secondary | ICD-10-CM

## 2019-12-31 NOTE — Therapy (Signed)
[facility] [address] [place], [place], [address] Phone: [phone]   Fax:  [phone]  Physical Therapy Treatment  Patient Details  Name: Carrie Cook MRN: [id number] Date of Birth: 1960/12/12 Referring Provider (PT): Arther Abbott, MD   Progress Note Reporting Period 11/24/19 to 12/31/19  See note below for Objective Data and Assessment of Progress/Goals.       Encounter Date: 12/31/2019   PT End of Session - 12/31/19 1122    Visit Number 10    Number of Visits 18    Date for PT Re-Evaluation 01/30/20    Authorization Type Medicare Part A    Progress Note Due on Visit 18    PT Start Time 1117    PT Stop Time 1205    PT Time Calculation (min) 48 min    Activity Tolerance Patient limited by pain    Behavior During Therapy [facility] for tasks assessed/performed           Past Medical History:  Diagnosis Date  . Anemia    PT HAS HAD COLONOSCOPY AND ENDOSCOPY WORK UP - NO PROBLEMS FOUND AS SOURCE OF ANEMIA -- PT HAD IRON INFUSION AT [facility] 11/13/12-AFTER SEEING HEMATOLOGIST DR. Wrens AND HE GAVE HEMATOLOGIC CLEARANCE FOR GASTRIC BYPASS SURGERY.  Marland Kitchen Anxiety   . Asthma    daily and prn inhalers  . Degenerative joint disease    right knee, spine - STATES INTERMITTENT  NUMBNESS DOWN RT LEG WITH PROLONGED STANDING OR WALKING- THINKS RELATED TO HER SPINE PROBLEMS  . Dental crowns present   . Depression   . Family history of anesthesia complication    pt's mother and sister have hx. of post-op N/V  . Fibroids    UTERINE  . Gastroesophageal reflux disease    none for 2 years since Bariatric surgery.  . H/O hiatal hernia   . History of endometriosis   . History of kidney stones   . Hyperlipidemia   . Hypertension    under control with med., has been on med. x 2 yr.  . Insulin dependent diabetes mellitus   . Lock jaw    jaw locks open if opens mouth wide  . Migraines   . Neuropathy    FEET  . Obesity   .  Palpitations   . PONV (postoperative nausea and vomiting)   . Shortness of breath    with daily activities  . Sleep apnea    post-op didn't need CPAP but now states needs it again but not using    Past Surgical History:  Procedure Laterality Date  . ABDOMINAL HYSTERECTOMY N/A 06/06/2017   Procedure: HYSTERECTOMY ABDOMINAL;  Surgeon: Florian Buff, MD;  Location: [facility];  Service: Gynecology;  Laterality: N/A;  . BREATH TEK H PYLORI N/A 08/13/2012   Procedure: BREATH TEK H PYLORI;  Surgeon: Edward Jolly, MD;  Location: [facility];  Service: General;  Laterality: N/A;  . CARPAL TUNNEL RELEASE  01/03/2012   Procedure: CARPAL TUNNEL RELEASE;  Surgeon: Wynonia Sours, MD;  Location: [facility];  Service: Orthopedics;  Laterality: Right;  . carpal tunnel release right hand Right 12/13  . COLONOSCOPY  05/2009   DVV:OHYWVPXT hemorrhoids otherwise normal colon, rectum and terminal ileum  . COLONOSCOPY WITH ESOPHAGOGASTRODUODENOSCOPY (EGD) N/A 10/28/2012   Procedure: COLONOSCOPY WITH ESOPHAGOGASTRODUODENOSCOPY (EGD);  Surgeon: Daneil Dolin, MD;  [facility];  Service: Endoscopy;  Laterality: N/A;  7:30  . DILITATION &  CURRETTAGE/HYSTROSCOPY WITH NOVASURE ABLATION N/A 07/22/2014   Procedure: DILATATION & CURETTAGE/HYSTEROSCOPY WITH NOVASURE ABLATION; uterine length 6.0 cm; uterine width 3.8 cm; total ablation time 1 minute 15 seconds;  Surgeon: Florian Buff, MD;  Location: [facility];  Service: Gynecology;  Laterality: N/A;  . ESOPHAGOGASTRODUODENOSCOPY  5/013/2011   FXT:KWIOXB esophagus/multiple polyps removed s/p (hyperplastic). Gastritis without H.pylori.  . ESOPHAGOGASTRODUODENOSCOPY (EGD) WITH PROPOFOL N/A 06/06/2018   with LA Grade A esophagitis s/p dilation.   Marland Kitchen GASTRIC ROUX-EN-Y N/A 11/19/2012   Procedure: LAPAROSCOPIC ROUX-EN-Y GASTRIC;  Surgeon: Edward Jolly, MD;  Location: [facility];  Service: General;  Laterality: N/A;  . GIVENS CAPSULE STUDY N/A  10/28/2012   Procedure: GIVENS CAPSULE STUDY;  Surgeon: Daneil Dolin, MD;  [facility];  Service: Endoscopy;  Laterality: N/A;  . KNEE ARTHROSCOPY WITH MEDIAL MENISECTOMY Right 10/28/2015   Procedure: RIGHT KNEE ARTHROSCOPY WITH MEDIAL MENISECTOMY;  Surgeon: Carole Civil, MD;  [facility];  Service: Orthopedics;  Laterality: Right;  . LAPAROSCOPY     due to endometriosis  . MALONEY DILATION N/A 06/06/2018   Procedure: Venia Minks DILATION;  Surgeon: Daneil Dolin, MD;  [facility];  Service: Endoscopy;  Laterality: N/A;  . PELVIC LAPAROSCOPY  11/04/1999   with fulguration of endometriosis  . SALPINGOOPHORECTOMY Bilateral 06/06/2017   Procedure: SALPINGO OOPHORECTOMY;  Surgeon: Florian Buff, MD;  Location: [facility];  Service: Gynecology;  Laterality: Bilateral;  . TOTAL KNEE ARTHROPLASTY Right 07/22/2019   Procedure: TOTAL KNEE ARTHROPLASTY;  Surgeon: Carole Civil, MD;  Location: [facility];  Service: Orthopedics;  Laterality: Right;  . TRIGGER FINGER RELEASE Right 09/24/2012   Procedure: RELEASE A-1 PULLEY OF RIGHT THUMB;  Surgeon: Wynonia Sours, MD;  Location: [facility];  Service: Orthopedics;  Laterality: Right;  . TUBAL LIGATION  1993    There were no vitals filed for this visit.   Subjective Assessment - 12/31/19 1121    Subjective Patient says she is more sore lately. Did some house work yesterday for first time in [place]. Does feel relief from doing press ups and HEP. Still has burning in low back with sitting up and down, but not as bad as last week. Reports 50% improvement since starting therapy.    Pertinent History RT TKA    Limitations Standing;Lifting;Walking;House hold activities    How long can you sit comfortably? 30 minutes    How long can you stand comfortably? 20 minutes    How long can you walk comfortably? no issues    Diagnostic tests xrays    Patient Stated Goals decrease pain    Currently in Pain? Yes    Pain Score 3      Pain Location Back    Pain Orientation Posterior;Lower    Pain Descriptors / Indicators Burning    Pain Type Acute pain    Pain Onset More than a month ago    Pain Frequency Constant              [facility] PT Assessment - 12/31/19 0001      Assessment   Medical Diagnosis LBP     Referring Provider (PT) Arther Abbott, MD    Onset Date/Surgical Date 09/09/19    Next MD Visit 01/07/20    Prior Therapy Yes for knee       Precautions   Precautions None      Restrictions   Weight Bearing Restrictions No      Balance  Screen   Has the patient fallen in the past 6 months No      [place] residence      Prior Function   Level of Independence Independent with household mobility with device      Cognition   Overall Cognitive Status Within Functional Limits for tasks assessed      Observation/Other Assessments   Focus on Therapeutic Outcomes (FOTO)  60% limited    was 62%                        [facility] Adult PT Treatment/Exercise - 12/31/19 0001      Lumbar Exercises: Stretches   Prone on Elbows Stretch 3 reps;60 seconds      Lumbar Exercises: Supine   Ab Set 15 reps;5 seconds    Bent Knee Raise 20 reps    Dead Bug 20 reps    Bridge 5 seconds;10 reps    Bridge Limitations increased back and RLE pain     Straight Leg Raise 20 reps    Other Supine Lumbar Exercises PPU x 10 (better), PPU with exhale x10 (better), PPU with overpressure 2 x10 (no effect)        Lumbar Exercises: Quadruped   Other Quadruped Lumbar Exercises hip extension with ab brace in quadruped x10                     PT Short Term Goals - 12/31/19 1129      PT SHORT TERM GOAL #1   Title Patient will be independent with initial HEP and self-management strategies to improve functional outcomes    Baseline Reports and demos compliance    Time 3    Period Weeks    Status Achieved    Target Date 12/19/19             PT Long Term  Goals - 12/31/19 1130      PT LONG TERM GOAL #1   Title Patient will improve FOTO score by at least 10% to indicate improvement in functional outcomes    Baseline current 2% improved    Time 6    Period Weeks    Status On-going      PT LONG TERM GOAL #2   Title Patient will report at least 75% overall improvement in subjective complaint to indicate improvement in ability to perform ADLs.    Baseline Current reports 50% improved    Time 6    Period Weeks    Status On-going      PT LONG TERM GOAL #3   Title Patient will have equal to or > 4+/5 MMT throughout BLE to improve ability to perform functional mobility, stair ambulation and ADLs.    Time 6    Period Weeks    Status On-going      PT LONG TERM GOAL #4   Title Patient will be independent with final HEP and self-management strategies to improve functional outcomes    Time 6    Period Weeks    Status On-going                 Plan - 12/31/19 1811    Clinical Impression Statement Patient shows slow progress toward therapy goals. Patient progress has been highly variable as pain had been reducing in initial weeks, but most recently patient has been complaining of increased radicular symptoms that are brought on by sitting, standing, and sometimes  occur while sitting. To this point patient has been able to consistently reduce symptoms with guided exercise in clinic, but will return at subsequent visit with elevated pain levels. Patient remains largely pain limited with functional ability, and pain limited in lumbar mobility also. At this point patient would likely continue to benefit from skilled therapy services to further assess pain response with guided activity, and to progress core strength for reduced pain levels and improved LOF with ADLs. If symptoms are no better within 1-2 weeks, plan to refer back to PCP for further evaluation.    Examination-Activity Limitations Locomotion Level;Transfers;Squat;Bend;Sleep     Examination-Participation Restrictions Community Activity;[facility]    Stability/Clinical Decision Making Stable/Uncomplicated    Rehab Potential Good    PT Frequency 3x / week    PT Duration 6 weeks    PT Treatment/Interventions ADLs/Self Care Home Management;Aquatic Therapy;Biofeedback;Cryotherapy;Electrical Stimulation;Moist Heat;DME Instruction;Gait training;Stair training;Functional mobility training;Therapeutic activities;Therapeutic exercise;Balance training;Neuromuscular re-education;Patient/family education;Manual techniques;Scar mobilization;Passive range of motion;Dry needling;Taping;Joint Manipulations    PT Next Visit Plan Continue to monitor pain symptoms and progress hip and core strength as tolerated. Manuals as needed to address pain and restriction. DC to PCP in 1-2 weeks if symptoms are unchanged    PT Home Exercise Plan 11/24/19: ab set, bridge, piriformis stretch 11/10 POE, press up, standing ext; 11/12:  sit to stand, sitting tall, abdominal activation with scapular retraction while sitting; 12/15/19 - supine and prone SLR w/ ab set 12/22/19: sidelying hip abd, deadbugs    Consulted and Agree with Plan of Care Patient           Patient will benefit from skilled therapeutic intervention in order to improve the following deficits and impairments:  Postural dysfunction, Pain, Decreased activity tolerance, Decreased strength, Impaired perceived functional ability, Improper body mechanics  Visit Diagnosis: Low back pain, unspecified back pain laterality, unspecified chronicity, unspecified whether sciatica present  Abnormal posture  Other abnormalities of gait and mobility     Problem List Patient Active Problem List   Diagnosis Date Noted  . Acute right-sided back pain with sciatica 10/02/2019  . S/P total knee replacement, right 07/22/19  09/01/2019  . Primary localized osteoarthritis of left knee 07/22/2019  . Primary osteoarthritis of right knee   .  Constipation 09/05/2018  . Abdominal pain, chronic, epigastric 05/27/2018  . Dysphagia 05/27/2018  . Depression, major, single episode, moderate ([place]) 03/22/2018  . S/P complete hysterectomy 06/06/2017  . Derangement of posterior horn of medial meniscus of right knee   . Right knee pain 09/10/2015  . Iron deficiency anemia 10/16/2013  . Fibroids 08/21/2013  . Excessive or frequent menstruation 06/19/2013  . Morbid obesity ([place]) 07/12/2012  . Obstructive sleep apnea 06/21/2012  . Chest pain   . Hyperlipidemia   . Type 2 diabetes mellitus ([place])   . Obesity   . Laboratory test   . Nephrolithiasis   . Asthma   . Endometriosis   . Hypertension   . Iron deficiency anemia 05/11/2009  . GERD 05/11/2009    6:16 PM, 12/31/19 Josue Hector PT DPT  Physical Therapist with [facility]  [phone] [address] [place], [place], [address] Phone: [phone]   Fax:  [phone]  Name: Carrie Cook MRN: [id number] Date of Birth: 03-13-1960

## 2020-01-04 DIAGNOSIS — M5440 Lumbago with sciatica, unspecified side: Secondary | ICD-10-CM | POA: Diagnosis not present

## 2020-01-04 DIAGNOSIS — M5136 Other intervertebral disc degeneration, lumbar region: Secondary | ICD-10-CM | POA: Diagnosis not present

## 2020-01-04 DIAGNOSIS — M5416 Radiculopathy, lumbar region: Secondary | ICD-10-CM | POA: Diagnosis not present

## 2020-01-07 ENCOUNTER — Encounter: Payer: Self-pay | Admitting: Orthopedic Surgery

## 2020-01-07 ENCOUNTER — Other Ambulatory Visit: Payer: Self-pay

## 2020-01-07 ENCOUNTER — Ambulatory Visit (INDEPENDENT_AMBULATORY_CARE_PROVIDER_SITE_OTHER): Payer: Medicare Other | Admitting: Orthopedic Surgery

## 2020-01-07 VITALS — BP 173/110 | HR 121 | Ht 63.0 in | Wt 189.0 lb

## 2020-01-07 DIAGNOSIS — M5116 Intervertebral disc disorders with radiculopathy, lumbar region: Secondary | ICD-10-CM | POA: Diagnosis not present

## 2020-01-07 DIAGNOSIS — Z96651 Presence of right artificial knee joint: Secondary | ICD-10-CM | POA: Diagnosis not present

## 2020-01-07 DIAGNOSIS — M545 Low back pain, unspecified: Secondary | ICD-10-CM

## 2020-01-07 DIAGNOSIS — M5410 Radiculopathy, site unspecified: Secondary | ICD-10-CM | POA: Diagnosis not present

## 2020-01-07 MED ORDER — METHOCARBAMOL 750 MG PO TABS: 750.0000 mg | ORAL_TABLET | Freq: Four times a day (QID) | ORAL | 2 refills | Status: DC

## 2020-01-07 MED ORDER — GABAPENTIN 400 MG PO CAPS: 400.0000 mg | ORAL_CAPSULE | Freq: Four times a day (QID) | ORAL | 2 refills | Status: DC

## 2020-01-07 MED ORDER — PREDNISONE 10 MG (48) PO TBPK: ORAL_TABLET | Freq: Every day | ORAL | 0 refills | Status: DC

## 2020-01-07 NOTE — Patient Instructions (Addendum)
CHANGE YOUR MEDICATIONS TO   STERAPRED DOSE PACK   ROBAXIN 750 MG Q 6   GABAPENTIN 400 MG EVERY 6 HRS   Call to schedule MRI scan [phone]  Herniated Disk  A herniated disk, also called a ruptured disk or slipped disk, occurs when a disk in the spine bulges out too far. Between the bones in the spine (vertebrae), there are oval disks that are made of a soft, spongy center filled with liquid that is surrounded by a tough outer ring. The disks connect the vertebrae, help the spine move, and keep the bones from rubbing against each other when you move. When you have a herniated disk, the spongy center of the disk bulges out or breaks through the outer ring. The spongy center can press on a nerve between the vertebrae and cause pain. This can occur anywhere in the back or neck area, but the lower back is most commonly affected. What are the causes? This condition may be caused by:  Age-related wear and tear. The spongy centers of spinal disks tend to shrink and dry out with age, which makes them more likely to herniate.  Sudden injury, such as a strain or sprain. What increases the risk? The following factors may make you more likely to develop this condition:  Aging. This is the main risk factor for a herniated disk.  Being a man who is 9-48 years old.  Frequently doing activities that involve heavy lifting, bending, or twisting.  Not getting enough exercise.  Being overweight.  Using tobacco products. What are the signs or symptoms? Symptoms may vary depending on where your herniated disk is located.  A herniated disk in the lower back may cause sharp pain in: ? Part of the arm, leg, hip, or buttocks. ? The back of the lower leg (calf). ? The lower back, spreading down through the leg into the foot (sciatica).  A herniated disk in the neck may cause dizziness and vertigo. It may also cause pain or weakness in: ? The neck. ? The shoulder blades. ? The upper arm,  forearm, or fingers. You may also have muscle weakness. It may be difficult to:  Lift your leg or arm.  Stand on your toes.  Squeeze tightly with one of your hands. Other symptoms may include:  Numbness or tingling in the affected areas of the hands, arms, feet, or legs.  Inability to control when you urinate or when you have bowel movements. This is a rare but serious sign of a severe herniated disk in the lower back. How is this diagnosed? This condition may be diagnosed based on:  Your symptoms.  Your medical history.  A physical exam. The exam may include: ? A straight-leg test. For this test, you will lie on your back while your health care provider lifts your leg, keeping your knee straight. If you feel pain, you likely have a herniated disk. ? Neurologic tests. These include checking for numbness, reflexes, muscle strength, and problems with posture.  Imaging tests, such as: ? X-rays. ? MRI. ? CT scan. ? Electromyogram (EMG) to check the nerves that control muscles. This test may be used to determine which nerves are affected by your herniated disk. How is this treated? Treatment for this condition may include:  A short period of rest. This is usually the first treatment. ? You may be on bed rest for up to 2 days, or you may be instructed to stay home and avoid physical activity. ?  If you have a herniated disk in your lower back, avoid sitting as much as possible. Sitting increases pressure on the disk.  Medicines. These may include: ? NSAIDs, such as ibuprofen, to help reduce pain and swelling. ? Muscle relaxants to prevent sudden tightening of the back muscles (back spasms). ? Prescription pain medicines, if you have severe pain.  Ice or heat therapy.  Steroid injections in the area of the herniated disk. These can help reduce pain and swelling.  Physical therapy to strengthen your back muscles. In many cases, symptoms go away with treatment over a period of  days or weeks. You will most likely be free of symptoms after 3-4 months. If other treatments do not help to relieve your symptoms, you may need surgery. Follow these instructions at home: Medicines  Take over-the-counter and prescription medicines only as told by your health care provider.  Ask your health care provider if the medicine prescribed to you: ? Requires you to avoid driving or using heavy machinery. ? Can cause constipation. You may need to take these actions to prevent or treat constipation:  Drink enough fluid to keep your urine pale yellow.  Take over-the-counter or prescription medicines.  Eat foods that are high in fiber, such as beans, whole grains, and fresh fruits and vegetables.  Limit foods that are high in fat and processed sugars, such as fried or sweet foods. Managing pain, stiffness, and swelling      If directed, put ice on the painful area. Icing can help to relieve pain. To do this: ? Put ice in a plastic bag. ? Place a towel between your skin and the bag. ? Leave the ice on for 20 minutes, 2-3 times a day.  If directed, apply heat to the painful area as often as told by your health care provider. Heat can reduce the stiffness of your muscles. Use the heat source that your health care provider recommends, such as a moist heat pack or a heating pad. ? Place a towel between your skin and the heat source. ? Leave the heat on for 20-30 minutes. ? Remove the heat if your skin turns bright red. This is especially important if you are unable to feel pain, heat, or cold. You may have a greater risk of getting burned. Activity  Rest as told by your health care provider.  After your rest period: ? Return to your normal activities and gradually begin exercising as told by your health care provider. Ask your health care provider what activities and exercises are safe for you. ? Use good posture. ? Avoid movements that cause pain. ? Do not lift anything that is  heavier than 10 lb (4.5 kg), or the limit that you are told, until your health care provider says that it is safe. ? Do not sit or stand for long periods of time without changing positions. ? Do not sit for long periods of time without getting up and moving around.  If physical therapy was prescribed, do exercises as told by your health care provider.  Aim to strengthen muscles in your back and abdomen with exercises such as swimming or walking. General instructions  Do not use any products that contain nicotine or tobacco, such as cigarettes, e-cigarettes, and chewing tobacco. These products can delay healing. If you need help quitting, ask your health care provider.  Do not wear high-heeled shoes.  Do not sleep on your abdomen.  If you are overweight, work with your health care provider  to lose weight safely.  Keep all follow-up visits as told by your health care provider. This is important. How is this prevented?   Maintain a healthy weight.  Maintain physical fitness. Do at least 150 minutes of moderate-intensity exercise each week, such as brisk walking or water aerobics.  When lifting objects: ? Keep your feet at least shoulder-width apart and tighten the muscles of your abdomen. ? Keep your spine neutral as you bend your knees and hips. It is important to lift using the strength of your legs, not your back. Do not lock your knees straight out. ? Always ask for help to lift heavy or awkward objects. Contact a health care provider if you:  Have back pain or neck pain that does not get better after 6 weeks.  Have severe pain in your back, neck, legs, or arms.  Develop numbness, tingling, or weakness anywhere in your body. Get help right away if:  You cannot move your arms or legs.  You cannot control when you urinate or have bowel movements.  You feel dizzy or you faint.  You have shortness of breath. Summary  A herniated disk, also called a ruptured disk or slipped  disk, occurs when a disk in the spine bulges out too far (herniates).  This condition may be caused by age-related wear and tear or a sudden injury.  Symptoms may vary depending on where your herniated disk is located.  Treatment may include rest, medicines, ice or heat therapy, steroid injections, and physical therapy.  If other treatments do not help to relieve your symptoms, you may need surgery. This information is not intended to replace advice given to you by your health care provider. Make sure you discuss any questions you have with your health care provider. Document Revised: 08/13/2018 Document Reviewed: 08/13/2018 Elsevier Patient Education  [place].

## 2020-01-07 NOTE — Progress Notes (Signed)
Chief Complaint  Patient presents with  . Back Pain    Hurting right much.Went to Urgent Care in [facility] 01/04/20 got 2 injections that didn't help.    Encounter Diagnoses  Name Primary?  . Lumbar pain   . Radicular pain of right lower extremity   . S/P total knee replacement, right 07/22/19    . Intervertebral disc disorders with radiculopathy, lumbar region Yes    59 year old female status post right total knee back in June had some postoperative and persistent radicular pain right lower extremity.  Patient has been to physical therapy she has tried Tylenol gabapentin and Indocin and Valium still having quite a bit of discomfort  Complains of increased pain across the lower part of her back with a deep burning aching sensation radiating across from the right to the left and then right lower extremity radicular pain down to the foot  Patient has had to rely on a cane to ambulate  She increased her gabapentin to 300 mg every 5 hours.  Urgent care also prescribe 10 mg of hydrocodone which did not give her any significant relief of pain  Physical Exam Constitutional:      General: She is not in acute distress.    Appearance: She is well-developed.  Cardiovascular:     Comments: No peripheral edema Musculoskeletal:     Comments: Gait is supported by cane.  Pain has increased across the lower back right and left side as well as central and then there is radicular symptoms down the right leg with straight leg raise is positive on the right negative on the left  Skin:    General: Skin is warm and dry.  Neurological:     Mental Status: She is alert and oriented to person, place, and time.     Sensory: No sensory deficit.     Coordination: Coordination normal.     Gait: Gait normal.     Deep Tendon Reflexes: Reflexes are normal and symmetric.  Psychiatric:        Mood and Affect: Mood normal.      Plan Medication change  Meds ordered this encounter  Medications  . predniSONE  (STERAPRED UNI-PAK 48 TAB) 10 MG (48) TBPK tablet    Sig: Take by mouth daily. 12 DAYS DS AS DIRECTED    Dispense:  48 tablet    Refill:  0  . methocarbamol (ROBAXIN) 750 MG tablet    Sig: Take 1 tablet (750 mg total) by mouth 4 (four) times daily.    Dispense:  60 tablet    Refill:  2  . gabapentin (NEURONTIN) 400 MG capsule    Sig: Take 1 capsule (400 mg total) by mouth 4 (four) times daily.    Dispense:  40 capsule    Refill:  2    MRI LUMBAR

## 2020-01-07 NOTE — Addendum Note (Signed)
Addended byCandice Camp on: 01/07/2020 09:19 AM   Modules accepted: Orders

## 2020-01-07 NOTE — Progress Notes (Signed)
B

## 2020-01-08 ENCOUNTER — Ambulatory Visit (HOSPITAL_COMMUNITY): Payer: Medicare Other | Admitting: Physical Therapy

## 2020-01-09 ENCOUNTER — Telehealth (HOSPITAL_COMMUNITY): Payer: Self-pay | Admitting: Physical Therapy

## 2020-01-09 NOTE — Telephone Encounter (Signed)
S/w pt to r/s move apptments. Pt will have back surgery and wants to be d/c . She will return after surgery next year.

## 2020-01-11 ENCOUNTER — Other Ambulatory Visit: Payer: Self-pay | Admitting: Family Medicine

## 2020-01-13 ENCOUNTER — Encounter (HOSPITAL_COMMUNITY): Payer: Self-pay | Admitting: Physical Therapy

## 2020-01-13 NOTE — Therapy (Signed)
[place], [place], [address] Phone: [phone]   Fax:  [phone]  Patient Details  Name: Carrie Cook MRN: [id number] Date of Birth: 1960-12-02 Referring Provider:  Arther Abbott MD Encounter Date: 01/13/2020  PHYSICAL THERAPY DISCHARGE SUMMARY  Visits from Start of Care: 10  Current functional level related to goals / functional outcomes: See PN dated 12/31/19   Remaining deficits: See PN dated 12/31/19   Education / Equipment: Patient DC form therapy as she will be having back surgery. Will follow up with therapy as needed post op.  Plan: Patient agrees to discharge.  Patient goals were not met. Patient is being discharged due to the patient's request.  ?????        5:08 PM, 01/13/20 Josue Hector PT DPT  Physical Therapist with [facility]  [phone] [place], [place], [address] Phone: [phone]   Fax:  [phone]

## 2020-01-20 ENCOUNTER — Other Ambulatory Visit: Payer: Self-pay

## 2020-01-20 ENCOUNTER — Ambulatory Visit (HOSPITAL_COMMUNITY)
Admission: RE | Admit: 2020-01-20 | Discharge: 2020-01-20 | Disposition: A | Discharge status: Home / Self Care | Payer: Medicare Other | Source: Ambulatory Visit | Attending: Orthopedic Surgery | Admitting: Orthopedic Surgery

## 2020-01-20 DIAGNOSIS — M545 Low back pain, unspecified: Secondary | ICD-10-CM | POA: Diagnosis not present

## 2020-01-20 DIAGNOSIS — M5410 Radiculopathy, site unspecified: Secondary | ICD-10-CM | POA: Diagnosis not present

## 2020-01-26 ENCOUNTER — Encounter: Payer: Self-pay | Admitting: Orthopedic Surgery

## 2020-01-26 ENCOUNTER — Ambulatory Visit (INDEPENDENT_AMBULATORY_CARE_PROVIDER_SITE_OTHER): Payer: Medicare Other | Admitting: Orthopedic Surgery

## 2020-01-26 ENCOUNTER — Other Ambulatory Visit: Payer: Self-pay

## 2020-01-26 VITALS — BP 118/94 | HR 103 | Ht 63.0 in | Wt 189.0 lb

## 2020-01-26 DIAGNOSIS — M5410 Radiculopathy, site unspecified: Secondary | ICD-10-CM | POA: Diagnosis not present

## 2020-01-26 NOTE — Progress Notes (Signed)
MRI RESULTS FOLLOW UP   Encounter Diagnosis  Name Primary?  . Radicular pain of right lower extremity Yes    Chief Complaint  Patient presents with  . Back Pain    Same/ still painful down right leg     60 year old female status post total knee during her recovery of her total knee she started having some radicular pain in her right leg she was treated with oral medications and physical therapy and eventually did not improve so we sent her for MRI CURRENT MEDS: ROBAXIN AND IBUPROFEN WITH GABAPENTIN    + EXAM FINDINGS: Right leg pain positive straight leg on the right patient now ambulating with a walker with a flexed posture  MRI REPORT:   Disc levels:   T12-L1: Mild disc degeneration and disc bulging. Negative for stenosis.   L1-2: Mild disc degeneration with mild diffuse disc bulging and mild facet degeneration. No significant stenosis.   L2-3: Mild disc degeneration with diffuse disc bulging and mild endplate spurring. Mild facet degeneration. No significant stenosis   L3-4: 3 mm anterolisthesis. Disc space narrowing and disc bulging. Superimposed central disc protrusion. Advanced facet degeneration bilaterally. Moderate to severe spinal stenosis and moderate subarticular stenosis bilaterally.   L4-5: Shallow right paracentral disc protrusion with right L5 nerve root impingement in the subarticular zone. Mild to moderate spinal stenosis. Moderate facet hypertrophy bilaterally.   L5-S1: Small central disc protrusion and mild facet degeneration. Negative for neural impingement.   IMPRESSION: Moderate to severe spinal stenosis L3-4 with moderate subarticular stenosis bilaterally.   Shallow right paracentral disc protrusion L4-5 with right L5 nerve root impingement. Mild to moderate spinal stenosis.     Electronically Signed   By: Marlan Palau M.D.   On: 01/20/2020 08:46   ASSESSMENT AND PLAN :   The patient will need referral to neurosurgery for  definitive management  Until that time we can provide her with medication for pain control  Encounter Diagnosis  Name Primary?  . Radicular pain of right lower extremity Yes

## 2020-01-26 NOTE — Addendum Note (Signed)
Addended by: Arvilla Market on: 01/26/2020 09:58 AM   Modules accepted: Orders

## 2020-01-26 NOTE — Patient Instructions (Signed)
Herniated Disk  A herniated disk, also called a ruptured disk or slipped disk, occurs when a disk in the spine bulges out too far. Between the bones in the spine (vertebrae), there are oval disks that are made of a soft, spongy center filled with liquid that is surrounded by a tough outer ring. The disks connect the vertebrae, help the spine move, and keep the bones from rubbing against each other when you move. When you have a herniated disk, the spongy center of the disk bulges out or breaks through the outer ring. The spongy center can press on a nerve between the vertebrae and cause pain. This can occur anywhere in the back or neck area, but the lower back is most commonly affected. What are the causes? This condition may be caused by:  Age-related wear and tear. The spongy centers of spinal disks tend to shrink and dry out with age, which makes them more likely to herniate.  Sudden injury, such as a strain or sprain. What increases the risk? The following factors may make you more likely to develop this condition:  Aging. This is the main risk factor for a herniated disk.  Being a man who is 61-66 years old.  Frequently doing activities that involve heavy lifting, bending, or twisting.  Not getting enough exercise.  Being overweight.  Using tobacco products. What are the signs or symptoms? Symptoms may vary depending on where your herniated disk is located.  A herniated disk in the lower back may cause sharp pain in: ? Part of the arm, leg, hip, or buttocks. ? The back of the lower leg (calf). ? The lower back, spreading down through the leg into the foot (sciatica).  A herniated disk in the neck may cause dizziness and vertigo. It may also cause pain or weakness in: ? The neck. ? The shoulder blades. ? The upper arm, forearm, or fingers. You may also have muscle weakness. It may be difficult to:  Lift your leg or arm.  Stand on your toes.  Squeeze tightly with one of  your hands. Other symptoms may include:  Numbness or tingling in the affected areas of the hands, arms, feet, or legs.  Inability to control when you urinate or when you have bowel movements. This is a rare but serious sign of a severe herniated disk in the lower back. How is this diagnosed? This condition may be diagnosed based on:  Your symptoms.  Your medical history.  A physical exam. The exam may include: ? A straight-leg test. For this test, you will lie on your back while your health care provider lifts your leg, keeping your knee straight. If you feel pain, you likely have a herniated disk. ? Neurologic tests. These include checking for numbness, reflexes, muscle strength, and problems with posture.  Imaging tests, such as: ? X-rays. ? MRI. ? CT scan. ? Electromyogram (EMG) to check the nerves that control muscles. This test may be used to determine which nerves are affected by your herniated disk. How is this treated? Treatment for this condition may include:  A short period of rest. This is usually the first treatment. ? You may be on bed rest for up to 2 days, or you may be instructed to stay home and avoid physical activity. ? If you have a herniated disk in your lower back, avoid sitting as much as possible. Sitting increases pressure on the disk.  Medicines. These may include: ? NSAIDs, such as ibuprofen, to help  reduce pain and swelling. ? Muscle relaxants to prevent sudden tightening of the back muscles (back spasms). ? Prescription pain medicines, if you have severe pain.  Ice or heat therapy.  Steroid injections in the area of the herniated disk. These can help reduce pain and swelling.  Physical therapy to strengthen your back muscles. In many cases, symptoms go away with treatment over a period of days or weeks. You will most likely be free of symptoms after 3-4 months. If other treatments do not help to relieve your symptoms, you may need surgery. Follow  these instructions at home: Medicines  Take over-the-counter and prescription medicines only as told by your health care provider.  Ask your health care provider if the medicine prescribed to you: ? Requires you to avoid driving or using heavy machinery. ? Can cause constipation. You may need to take these actions to prevent or treat constipation:  Drink enough fluid to keep your urine pale yellow.  Take over-the-counter or prescription medicines.  Eat foods that are high in fiber, such as beans, whole grains, and fresh fruits and vegetables.  Limit foods that are high in fat and processed sugars, such as fried or sweet foods. Managing pain, stiffness, and swelling      If directed, put ice on the painful area. Icing can help to relieve pain. To do this: ? Put ice in a plastic bag. ? Place a towel between your skin and the bag. ? Leave the ice on for 20 minutes, 2-3 times a day.  If directed, apply heat to the painful area as often as told by your health care provider. Heat can reduce the stiffness of your muscles. Use the heat source that your health care provider recommends, such as a moist heat pack or a heating pad. ? Place a towel between your skin and the heat source. ? Leave the heat on for 20-30 minutes. ? Remove the heat if your skin turns bright red. This is especially important if you are unable to feel pain, heat, or cold. You may have a greater risk of getting burned. Activity  Rest as told by your health care provider.  After your rest period: ? Return to your normal activities and gradually begin exercising as told by your health care provider. Ask your health care provider what activities and exercises are safe for you. ? Use good posture. ? Avoid movements that cause pain. ? Do not lift anything that is heavier than 10 lb (4.5 kg), or the limit that you are told, until your health care provider says that it is safe. ? Do not sit or stand for long periods of  time without changing positions. ? Do not sit for long periods of time without getting up and moving around.  If physical therapy was prescribed, do exercises as told by your health care provider.  Aim to strengthen muscles in your back and abdomen with exercises such as swimming or walking. General instructions  Do not use any products that contain nicotine or tobacco, such as cigarettes, e-cigarettes, and chewing tobacco. These products can delay healing. If you need help quitting, ask your health care provider.  Do not wear high-heeled shoes.  Do not sleep on your abdomen.  If you are overweight, work with your health care provider to lose weight safely.  Keep all follow-up visits as told by your health care provider. This is important. How is this prevented?   Maintain a healthy weight.  Maintain physical fitness. Do  at least 150 minutes of moderate-intensity exercise each week, such as brisk walking or water aerobics.  When lifting objects: ? Keep your feet at least shoulder-width apart and tighten the muscles of your abdomen. ? Keep your spine neutral as you bend your knees and hips. It is important to lift using the strength of your legs, not your back. Do not lock your knees straight out. ? Always ask for help to lift heavy or awkward objects. Contact a health care provider if you:  Have back pain or neck pain that does not get better after 6 weeks.  Have severe pain in your back, neck, legs, or arms.  Develop numbness, tingling, or weakness anywhere in your body. Get help right away if:  You cannot move your arms or legs.  You cannot control when you urinate or have bowel movements.  You feel dizzy or you faint.  You have shortness of breath. Summary  A herniated disk, also called a ruptured disk or slipped disk, occurs when a disk in the spine bulges out too far (herniates).  This condition may be caused by age-related wear and tear or a sudden  injury.  Symptoms may vary depending on where your herniated disk is located.  Treatment may include rest, medicines, ice or heat therapy, steroid injections, and physical therapy.  If other treatments do not help to relieve your symptoms, you may need surgery. This information is not intended to replace advice given to you by your health care provider. Make sure you discuss any questions you have with your health care provider. Document Revised: 08/13/2018 Document Reviewed: 08/13/2018 Elsevier Patient Education  [place].  Herniated Disk  A herniated disk, also called a ruptured disk or slipped disk, occurs when a disk in the spine bulges out too far. Between the bones in the spine (vertebrae), there are oval disks that are made of a soft, spongy center filled with liquid that is surrounded by a tough outer ring. The disks connect the vertebrae, help the spine move, and keep the bones from rubbing against each other when you move. When you have a herniated disk, the spongy center of the disk bulges out or breaks through the outer ring. The spongy center can press on a nerve between the vertebrae and cause pain. This can occur anywhere in the back or neck area, but the lower back is most commonly affected. What are the causes? This condition may be caused by:  Age-related wear and tear. The spongy centers of spinal disks tend to shrink and dry out with age, which makes them more likely to herniate.  Sudden injury, such as a strain or sprain. What increases the risk? The following factors may make you more likely to develop this condition:  Aging. This is the main risk factor for a herniated disk.  Being a man who is 52-59 years old.  Frequently doing activities that involve heavy lifting, bending, or twisting.  Not getting enough exercise.  Being overweight.  Using tobacco products. What are the signs or symptoms? Symptoms may vary depending on where your herniated disk  is located.  A herniated disk in the lower back may cause sharp pain in: ? Part of the arm, leg, hip, or buttocks. ? The back of the lower leg (calf). ? The lower back, spreading down through the leg into the foot (sciatica).  A herniated disk in the neck may cause dizziness and vertigo. It may also cause pain or weakness in: ?  The neck. ? The shoulder blades. ? The upper arm, forearm, or fingers. You may also have muscle weakness. It may be difficult to:  Lift your leg or arm.  Stand on your toes.  Squeeze tightly with one of your hands. Other symptoms may include:  Numbness or tingling in the affected areas of the hands, arms, feet, or legs.  Inability to control when you urinate or when you have bowel movements. This is a rare but serious sign of a severe herniated disk in the lower back. How is this diagnosed? This condition may be diagnosed based on:  Your symptoms.  Your medical history.  A physical exam. The exam may include: ? A straight-leg test. For this test, you will lie on your back while your health care provider lifts your leg, keeping your knee straight. If you feel pain, you likely have a herniated disk. ? Neurologic tests. These include checking for numbness, reflexes, muscle strength, and problems with posture.  Imaging tests, such as: ? X-rays. ? MRI. ? CT scan. ? Electromyogram (EMG) to check the nerves that control muscles. This test may be used to determine which nerves are affected by your herniated disk. How is this treated? Treatment for this condition may include:  A short period of rest. This is usually the first treatment. ? You may be on bed rest for up to 2 days, or you may be instructed to stay home and avoid physical activity. ? If you have a herniated disk in your lower back, avoid sitting as much as possible. Sitting increases pressure on the disk.  Medicines. These may include: ? NSAIDs, such as ibuprofen, to help reduce pain and  swelling. ? Muscle relaxants to prevent sudden tightening of the back muscles (back spasms). ? Prescription pain medicines, if you have severe pain.  Ice or heat therapy.  Steroid injections in the area of the herniated disk. These can help reduce pain and swelling.  Physical therapy to strengthen your back muscles. In many cases, symptoms go away with treatment over a period of days or weeks. You will most likely be free of symptoms after 3-4 months. If other treatments do not help to relieve your symptoms, you may need surgery. Follow these instructions at home: Medicines  Take over-the-counter and prescription medicines only as told by your health care provider.  Ask your health care provider if the medicine prescribed to you: ? Requires you to avoid driving or using heavy machinery. ? Can cause constipation. You may need to take these actions to prevent or treat constipation:  Drink enough fluid to keep your urine pale yellow.  Take over-the-counter or prescription medicines.  Eat foods that are high in fiber, such as beans, whole grains, and fresh fruits and vegetables.  Limit foods that are high in fat and processed sugars, such as fried or sweet foods. Managing pain, stiffness, and swelling      If directed, put ice on the painful area. Icing can help to relieve pain. To do this: ? Put ice in a plastic bag. ? Place a towel between your skin and the bag. ? Leave the ice on for 20 minutes, 2-3 times a day.  If directed, apply heat to the painful area as often as told by your health care provider. Heat can reduce the stiffness of your muscles. Use the heat source that your health care provider recommends, such as a moist heat pack or a heating pad. ? Place a towel between your skin  and the heat source. ? Leave the heat on for 20-30 minutes. ? Remove the heat if your skin turns bright red. This is especially important if you are unable to feel pain, heat, or cold. You may  have a greater risk of getting burned. Activity  Rest as told by your health care provider.  After your rest period: ? Return to your normal activities and gradually begin exercising as told by your health care provider. Ask your health care provider what activities and exercises are safe for you. ? Use good posture. ? Avoid movements that cause pain. ? Do not lift anything that is heavier than 10 lb (4.5 kg), or the limit that you are told, until your health care provider says that it is safe. ? Do not sit or stand for long periods of time without changing positions. ? Do not sit for long periods of time without getting up and moving around.  If physical therapy was prescribed, do exercises as told by your health care provider.  Aim to strengthen muscles in your back and abdomen with exercises such as swimming or walking. General instructions  Do not use any products that contain nicotine or tobacco, such as cigarettes, e-cigarettes, and chewing tobacco. These products can delay healing. If you need help quitting, ask your health care provider.  Do not wear high-heeled shoes.  Do not sleep on your abdomen.  If you are overweight, work with your health care provider to lose weight safely.  Keep all follow-up visits as told by your health care provider. This is important. How is this prevented?   Maintain a healthy weight.  Maintain physical fitness. Do at least 150 minutes of moderate-intensity exercise each week, such as brisk walking or water aerobics.  When lifting objects: ? Keep your feet at least shoulder-width apart and tighten the muscles of your abdomen. ? Keep your spine neutral as you bend your knees and hips. It is important to lift using the strength of your legs, not your back. Do not lock your knees straight out. ? Always ask for help to lift heavy or awkward objects. Contact a health care provider if you:  Have back pain or neck pain that does not get better  after 6 weeks.  Have severe pain in your back, neck, legs, or arms.  Develop numbness, tingling, or weakness anywhere in your body. Get help right away if:  You cannot move your arms or legs.  You cannot control when you urinate or have bowel movements.  You feel dizzy or you faint.  You have shortness of breath. Summary  A herniated disk, also called a ruptured disk or slipped disk, occurs when a disk in the spine bulges out too far (herniates).  This condition may be caused by age-related wear and tear or a sudden injury.  Symptoms may vary depending on where your herniated disk is located.  Treatment may include rest, medicines, ice or heat therapy, steroid injections, and physical therapy.  If other treatments do not help to relieve your symptoms, you may need surgery. This information is not intended to replace advice given to you by your health care provider. Make sure you discuss any questions you have with your health care provider. Document Revised: 08/13/2018 Document Reviewed: 08/13/2018 Elsevier Patient Education  [place].

## 2020-01-28 ENCOUNTER — Other Ambulatory Visit: Payer: Self-pay | Admitting: Family Medicine

## 2020-01-30 ENCOUNTER — Other Ambulatory Visit: Payer: Self-pay | Admitting: Orthopedic Surgery

## 2020-01-30 DIAGNOSIS — M545 Low back pain, unspecified: Secondary | ICD-10-CM

## 2020-01-30 DIAGNOSIS — M5410 Radiculopathy, site unspecified: Secondary | ICD-10-CM

## 2020-01-30 NOTE — Telephone Encounter (Signed)
Rx request 

## 2020-02-02 ENCOUNTER — Ambulatory Visit: Payer: Medicare Other | Admitting: Orthopedic Surgery

## 2020-02-04 DIAGNOSIS — M5416 Radiculopathy, lumbar region: Secondary | ICD-10-CM | POA: Diagnosis not present

## 2020-02-06 DIAGNOSIS — Z1152 Encounter for screening for COVID-19: Secondary | ICD-10-CM | POA: Diagnosis not present

## 2020-02-11 DIAGNOSIS — M5416 Radiculopathy, lumbar region: Secondary | ICD-10-CM | POA: Diagnosis not present

## 2020-02-11 DIAGNOSIS — M48061 Spinal stenosis, lumbar region without neurogenic claudication: Secondary | ICD-10-CM | POA: Diagnosis not present

## 2020-02-11 DIAGNOSIS — M5116 Intervertebral disc disorders with radiculopathy, lumbar region: Secondary | ICD-10-CM | POA: Diagnosis not present

## 2020-02-11 DIAGNOSIS — M5126 Other intervertebral disc displacement, lumbar region: Secondary | ICD-10-CM | POA: Diagnosis not present

## 2020-02-13 ENCOUNTER — Encounter (HOSPITAL_COMMUNITY): Payer: Self-pay | Admitting: Emergency Medicine

## 2020-02-13 ENCOUNTER — Other Ambulatory Visit: Payer: Self-pay

## 2020-02-13 ENCOUNTER — Inpatient Hospital Stay (HOSPITAL_COMMUNITY)
Admission: EM | Admit: 2020-02-13 | Discharge: 2020-02-24 | DRG: 518 | Disposition: A | Discharge status: Rehabilitation Facility | Payer: Medicare Other | Attending: Neurological Surgery | Admitting: Neurological Surgery

## 2020-02-13 DIAGNOSIS — Z6835 Body mass index (BMI) 35.0-35.9, adult: Secondary | ICD-10-CM

## 2020-02-13 DIAGNOSIS — Z419 Encounter for procedure for purposes other than remedying health state, unspecified: Secondary | ICD-10-CM

## 2020-02-13 DIAGNOSIS — Z48811 Encounter for surgical aftercare following surgery on the nervous system: Secondary | ICD-10-CM | POA: Diagnosis not present

## 2020-02-13 DIAGNOSIS — K219 Gastro-esophageal reflux disease without esophagitis: Secondary | ICD-10-CM | POA: Diagnosis present

## 2020-02-13 DIAGNOSIS — M62838 Other muscle spasm: Secondary | ICD-10-CM | POA: Diagnosis present

## 2020-02-13 DIAGNOSIS — M21372 Foot drop, left foot: Secondary | ICD-10-CM | POA: Diagnosis not present

## 2020-02-13 DIAGNOSIS — Z8349 Family history of other endocrine, nutritional and metabolic diseases: Secondary | ICD-10-CM

## 2020-02-13 DIAGNOSIS — D509 Iron deficiency anemia, unspecified: Secondary | ICD-10-CM | POA: Diagnosis not present

## 2020-02-13 DIAGNOSIS — R110 Nausea: Secondary | ICD-10-CM | POA: Diagnosis not present

## 2020-02-13 DIAGNOSIS — E46 Unspecified protein-calorie malnutrition: Secondary | ICD-10-CM | POA: Diagnosis not present

## 2020-02-13 DIAGNOSIS — Z882 Allergy status to sulfonamides status: Secondary | ICD-10-CM

## 2020-02-13 DIAGNOSIS — G8918 Other acute postprocedural pain: Secondary | ICD-10-CM | POA: Diagnosis not present

## 2020-02-13 DIAGNOSIS — J45909 Unspecified asthma, uncomplicated: Secondary | ICD-10-CM | POA: Diagnosis not present

## 2020-02-13 DIAGNOSIS — E441 Mild protein-calorie malnutrition: Secondary | ICD-10-CM | POA: Diagnosis not present

## 2020-02-13 DIAGNOSIS — Z96651 Presence of right artificial knee joint: Secondary | ICD-10-CM | POA: Diagnosis present

## 2020-02-13 DIAGNOSIS — Z7984 Long term (current) use of oral hypoglycemic drugs: Secondary | ICD-10-CM | POA: Diagnosis not present

## 2020-02-13 DIAGNOSIS — G4733 Obstructive sleep apnea (adult) (pediatric): Secondary | ICD-10-CM | POA: Diagnosis present

## 2020-02-13 DIAGNOSIS — I10 Essential (primary) hypertension: Secondary | ICD-10-CM | POA: Diagnosis present

## 2020-02-13 DIAGNOSIS — E119 Type 2 diabetes mellitus without complications: Secondary | ICD-10-CM | POA: Diagnosis not present

## 2020-02-13 DIAGNOSIS — Z981 Arthrodesis status: Secondary | ICD-10-CM | POA: Diagnosis not present

## 2020-02-13 DIAGNOSIS — Z794 Long term (current) use of insulin: Secondary | ICD-10-CM

## 2020-02-13 DIAGNOSIS — Z8616 Personal history of COVID-19: Secondary | ICD-10-CM | POA: Diagnosis not present

## 2020-02-13 DIAGNOSIS — E8809 Other disorders of plasma-protein metabolism, not elsewhere classified: Secondary | ICD-10-CM | POA: Diagnosis not present

## 2020-02-13 DIAGNOSIS — Z8249 Family history of ischemic heart disease and other diseases of the circulatory system: Secondary | ICD-10-CM | POA: Diagnosis not present

## 2020-02-13 DIAGNOSIS — M5416 Radiculopathy, lumbar region: Secondary | ICD-10-CM | POA: Diagnosis present

## 2020-02-13 DIAGNOSIS — Z888 Allergy status to other drugs, medicaments and biological substances status: Secondary | ICD-10-CM

## 2020-02-13 DIAGNOSIS — Z825 Family history of asthma and other chronic lower respiratory diseases: Secondary | ICD-10-CM | POA: Diagnosis not present

## 2020-02-13 DIAGNOSIS — Z9884 Bariatric surgery status: Secondary | ICD-10-CM | POA: Diagnosis not present

## 2020-02-13 DIAGNOSIS — Z79899 Other long term (current) drug therapy: Secondary | ICD-10-CM | POA: Diagnosis not present

## 2020-02-13 DIAGNOSIS — Z79818 Long term (current) use of other agents affecting estrogen receptors and estrogen levels: Secondary | ICD-10-CM | POA: Diagnosis not present

## 2020-02-13 DIAGNOSIS — U071 COVID-19: Secondary | ICD-10-CM | POA: Diagnosis present

## 2020-02-13 DIAGNOSIS — R079 Chest pain, unspecified: Secondary | ICD-10-CM | POA: Diagnosis not present

## 2020-02-13 DIAGNOSIS — Z881 Allergy status to other antibiotic agents status: Secondary | ICD-10-CM | POA: Diagnosis not present

## 2020-02-13 DIAGNOSIS — M5117 Intervertebral disc disorders with radiculopathy, lumbosacral region: Secondary | ICD-10-CM | POA: Diagnosis not present

## 2020-02-13 DIAGNOSIS — M48061 Spinal stenosis, lumbar region without neurogenic claudication: Principal | ICD-10-CM | POA: Diagnosis present

## 2020-02-13 DIAGNOSIS — M21371 Foot drop, right foot: Secondary | ICD-10-CM | POA: Diagnosis not present

## 2020-02-13 DIAGNOSIS — K5903 Drug induced constipation: Secondary | ICD-10-CM | POA: Diagnosis not present

## 2020-02-13 DIAGNOSIS — M549 Dorsalgia, unspecified: Secondary | ICD-10-CM | POA: Diagnosis not present

## 2020-02-13 DIAGNOSIS — R7309 Other abnormal glucose: Secondary | ICD-10-CM | POA: Diagnosis not present

## 2020-02-13 DIAGNOSIS — M1990 Unspecified osteoarthritis, unspecified site: Secondary | ICD-10-CM | POA: Diagnosis not present

## 2020-02-13 DIAGNOSIS — Z83438 Family history of other disorder of lipoprotein metabolism and other lipidemia: Secondary | ICD-10-CM

## 2020-02-13 DIAGNOSIS — R531 Weakness: Secondary | ICD-10-CM | POA: Diagnosis not present

## 2020-02-13 DIAGNOSIS — R0789 Other chest pain: Secondary | ICD-10-CM | POA: Diagnosis not present

## 2020-02-13 DIAGNOSIS — F32A Depression, unspecified: Secondary | ICD-10-CM | POA: Diagnosis not present

## 2020-02-13 DIAGNOSIS — Z90722 Acquired absence of ovaries, bilateral: Secondary | ICD-10-CM | POA: Diagnosis not present

## 2020-02-13 DIAGNOSIS — M4726 Other spondylosis with radiculopathy, lumbar region: Secondary | ICD-10-CM | POA: Diagnosis not present

## 2020-02-13 DIAGNOSIS — E1165 Type 2 diabetes mellitus with hyperglycemia: Secondary | ICD-10-CM | POA: Diagnosis not present

## 2020-02-13 DIAGNOSIS — E785 Hyperlipidemia, unspecified: Secondary | ICD-10-CM | POA: Diagnosis not present

## 2020-02-13 DIAGNOSIS — G959 Disease of spinal cord, unspecified: Secondary | ICD-10-CM | POA: Diagnosis not present

## 2020-02-13 DIAGNOSIS — G992 Myelopathy in diseases classified elsewhere: Secondary | ICD-10-CM | POA: Diagnosis not present

## 2020-02-13 DIAGNOSIS — R270 Ataxia, unspecified: Secondary | ICD-10-CM | POA: Diagnosis not present

## 2020-02-13 DIAGNOSIS — Z833 Family history of diabetes mellitus: Secondary | ICD-10-CM

## 2020-02-13 DIAGNOSIS — M5106 Intervertebral disc disorders with myelopathy, lumbar region: Secondary | ICD-10-CM | POA: Diagnosis not present

## 2020-02-13 DIAGNOSIS — K5900 Constipation, unspecified: Secondary | ICD-10-CM | POA: Diagnosis not present

## 2020-02-13 DIAGNOSIS — Z6832 Body mass index (BMI) 32.0-32.9, adult: Secondary | ICD-10-CM | POA: Diagnosis not present

## 2020-02-13 DIAGNOSIS — R000 Tachycardia, unspecified: Secondary | ICD-10-CM | POA: Diagnosis not present

## 2020-02-13 LAB — BASIC METABOLIC PANEL
Anion gap: 9 (ref 5–15)
BUN: 11 mg/dL (ref 6–20)
CO2: 25 mmol/L (ref 22–32)
Calcium: 8.7 mg/dL — ABNORMAL LOW (ref 8.9–10.3)
Chloride: 104 mmol/L (ref 98–111)
Creatinine, Ser: 0.58 mg/dL (ref 0.44–1.00)
GFR, Estimated: >60 mL/min (ref >60)
Glucose, Bld: 139 mg/dL — ABNORMAL HIGH (ref 70–99)
Potassium: 3.5 mmol/L (ref 3.5–5.1)
Sodium: 138 mmol/L (ref 135–145)

## 2020-02-13 LAB — CBC
HCT: 42 % (ref 36.0–46.0)
Hemoglobin: 14.3 g/dL (ref 12.0–15.0)
MCH: 31.6 pg (ref 26.0–34.0)
MCHC: 34 g/dL (ref 30.0–36.0)
MCV: 92.9 fL (ref 80.0–100.0)
Platelets: 270 10*3/uL (ref 150–400)
RBC: 4.52 MIL/uL (ref 3.87–5.11)
RDW: 11.3 % — ABNORMAL LOW (ref 11.5–15.5)
WBC: 10.2 10*3/uL (ref 4.0–10.5)
nRBC: 0 % (ref 0.0–0.2)

## 2020-02-13 LAB — CBG MONITORING, ED: Glucose-Capillary: 136 mg/dL — ABNORMAL HIGH (ref 70–99)

## 2020-02-13 MED ORDER — ACETAMINOPHEN 650 MG RE SUPP: 650.0000 mg | RECTAL | Status: DC | PRN

## 2020-02-13 MED ORDER — HYDROCODONE-ACETAMINOPHEN 5-325 MG PO TABS
1.0000 | ORAL_TABLET | ORAL | Status: DC | PRN
Start: 2020-02-13 — End: 2020-02-24
  Administered 2020-02-14 – 2020-02-17 (×3): 1 via ORAL
  Filled 2020-02-13 (×4): qty 1

## 2020-02-13 MED ORDER — SODIUM CHLORIDE 0.9 % IV SOLN
250.0000 mL | INTRAVENOUS | Status: DC
  Administered 2020-02-14: 250 mL via INTRAVENOUS

## 2020-02-13 MED ORDER — HYDROMORPHONE HCL 1 MG/ML IJ SOLN
0.5000 mg | INTRAMUSCULAR | Status: DC | PRN
  Administered 2020-02-14 – 2020-02-18 (×15): 1 mg via INTRAVENOUS
  Filled 2020-02-13 (×15): qty 1

## 2020-02-13 MED ORDER — ALBUTEROL SULFATE HFA 108 (90 BASE) MCG/ACT IN AERS
2.0000 | INHALATION_SPRAY | Freq: Four times a day (QID) | RESPIRATORY_TRACT | Status: DC | PRN
  Filled 2020-02-13: qty 6.7

## 2020-02-13 MED ORDER — BUDESONIDE 0.25 MG/2ML IN SUSP: 0.2500 mg | Freq: Two times a day (BID) | RESPIRATORY_TRACT | Status: DC | PRN

## 2020-02-13 MED ORDER — FLEET ENEMA 7-19 GM/118ML RE ENEM
1.0000 | ENEMA | Freq: Once | RECTAL | Status: AC | PRN
  Administered 2020-02-22: 1 via RECTAL
  Filled 2020-02-13: qty 1

## 2020-02-13 MED ORDER — ACETAMINOPHEN 500 MG PO TABS
1000.0000 mg | ORAL_TABLET | Freq: Four times a day (QID) | ORAL | Status: AC
  Administered 2020-02-14 (×2): 1000 mg via ORAL
  Filled 2020-02-13 (×3): qty 2

## 2020-02-13 MED ORDER — FLUTICASONE PROPIONATE HFA 220 MCG/ACT IN AERO: 2.0000 | INHALATION_SPRAY | Freq: Every day | RESPIRATORY_TRACT | Status: DC | PRN

## 2020-02-13 MED ORDER — METHOCARBAMOL 750 MG PO TABS
750.0000 mg | ORAL_TABLET | Freq: Four times a day (QID) | ORAL | Status: DC
  Administered 2020-02-15 – 2020-02-18 (×13): 750 mg via ORAL
  Filled 2020-02-13 (×13): qty 1
  Filled 2020-02-13: qty 2
  Filled 2020-02-13: qty 1

## 2020-02-13 MED ORDER — LORATADINE 10 MG PO TABS
10.0000 mg | ORAL_TABLET | Freq: Every day | ORAL | Status: DC
  Administered 2020-02-15 – 2020-02-24 (×10): 10 mg via ORAL
  Filled 2020-02-13 (×11): qty 1

## 2020-02-13 MED ORDER — ADULT MULTIVITAMIN W/MINERALS CH
1.0000 | ORAL_TABLET | Freq: Every day | ORAL | Status: DC
  Administered 2020-02-16 – 2020-02-24 (×9): 1 via ORAL
  Filled 2020-02-13 (×11): qty 1

## 2020-02-13 MED ORDER — SODIUM CHLORIDE 0.9% FLUSH
3.0000 mL | Freq: Two times a day (BID) | INTRAVENOUS | Status: DC
  Administered 2020-02-14 – 2020-02-23 (×17): 3 mL via INTRAVENOUS

## 2020-02-13 MED ORDER — OXYCODONE HCL 5 MG PO TABS
5.0000 mg | ORAL_TABLET | ORAL | Status: DC | PRN
Start: 2020-02-13 — End: 2020-02-24
  Administered 2020-02-15 – 2020-02-22 (×33): 10 mg via ORAL
  Administered 2020-02-22: 5 mg via ORAL
  Administered 2020-02-22 – 2020-02-23 (×2): 10 mg via ORAL
  Administered 2020-02-23: 5 mg via ORAL
  Administered 2020-02-24 (×4): 10 mg via ORAL
  Filled 2020-02-13 (×28): qty 2
  Filled 2020-02-13: qty 1
  Filled 2020-02-13: qty 2
  Filled 2020-02-13: qty 1
  Filled 2020-02-13 (×10): qty 2

## 2020-02-13 MED ORDER — ESTRADIOL 1 MG PO TABS
2.0000 mg | ORAL_TABLET | Freq: Every evening | ORAL | Status: DC
  Administered 2020-02-15 – 2020-02-23 (×9): 2 mg via ORAL
  Filled 2020-02-13 (×3): qty 2
  Filled 2020-02-13: qty 1
  Filled 2020-02-13 (×4): qty 2
  Filled 2020-02-13: qty 1
  Filled 2020-02-13 (×3): qty 2

## 2020-02-13 MED ORDER — SENNA 8.6 MG PO TABS
1.0000 | ORAL_TABLET | Freq: Two times a day (BID) | ORAL | Status: DC
  Administered 2020-02-14 – 2020-02-23 (×19): 8.6 mg via ORAL
  Filled 2020-02-13 (×21): qty 1

## 2020-02-13 MED ORDER — METHOCARBAMOL 1000 MG/10ML IJ SOLN
500.0000 mg | Freq: Four times a day (QID) | INTRAVENOUS | Status: DC | PRN
  Administered 2020-02-14: 500 mg via INTRAVENOUS
  Filled 2020-02-13 (×2): qty 5

## 2020-02-13 MED ORDER — METFORMIN HCL 500 MG PO TABS
500.0000 mg | ORAL_TABLET | Freq: Two times a day (BID) | ORAL | Status: DC
  Administered 2020-02-15 – 2020-02-24 (×19): 500 mg via ORAL
  Filled 2020-02-13 (×20): qty 1

## 2020-02-13 MED ORDER — VENLAFAXINE HCL 37.5 MG PO TABS
75.0000 mg | ORAL_TABLET | Freq: Two times a day (BID) | ORAL | Status: DC
  Administered 2020-02-15 – 2020-02-24 (×19): 75 mg via ORAL
  Filled 2020-02-13 (×3): qty 2
  Filled 2020-02-13: qty 1
  Filled 2020-02-13 (×8): qty 2
  Filled 2020-02-13: qty 1
  Filled 2020-02-13 (×8): qty 2

## 2020-02-13 MED ORDER — METHOCARBAMOL 1000 MG/10ML IJ SOLN
500.0000 mg | Freq: Once | INTRAVENOUS | Status: AC
  Administered 2020-02-14: 500 mg via INTRAVENOUS
  Filled 2020-02-13: qty 5

## 2020-02-13 MED ORDER — BISACODYL 10 MG RE SUPP
10.0000 mg | Freq: Every day | RECTAL | Status: DC | PRN
  Administered 2020-02-18: 10 mg via RECTAL
  Filled 2020-02-13: qty 1

## 2020-02-13 MED ORDER — FENTANYL CITRATE (PF) 100 MCG/2ML IJ SOLN
50.0000 ug | Freq: Once | INTRAMUSCULAR | Status: AC
  Administered 2020-02-13: 50 ug via INTRAVENOUS
  Filled 2020-02-13: qty 2

## 2020-02-13 MED ORDER — SODIUM CHLORIDE 0.9 % IV SOLN: INTRAVENOUS | Status: DC

## 2020-02-13 MED ORDER — ONDANSETRON HCL 4 MG/2ML IJ SOLN
4.0000 mg | Freq: Four times a day (QID) | INTRAMUSCULAR | Status: DC | PRN
  Administered 2020-02-14 – 2020-02-23 (×4): 4 mg via INTRAVENOUS
  Filled 2020-02-13 (×4): qty 2

## 2020-02-13 MED ORDER — AMLODIPINE BESYLATE 2.5 MG PO TABS
2.5000 mg | ORAL_TABLET | Freq: Every day | ORAL | Status: DC
  Administered 2020-02-15 – 2020-02-24 (×10): 2.5 mg via ORAL
  Filled 2020-02-13 (×11): qty 1

## 2020-02-13 MED ORDER — ROSUVASTATIN CALCIUM 5 MG PO TABS
5.0000 mg | ORAL_TABLET | Freq: Every day | ORAL | Status: DC
  Administered 2020-02-15 – 2020-02-24 (×10): 5 mg via ORAL
  Filled 2020-02-13 (×11): qty 1

## 2020-02-13 MED ORDER — METHOCARBAMOL 500 MG PO TABS
500.0000 mg | ORAL_TABLET | Freq: Four times a day (QID) | ORAL | Status: DC | PRN
  Administered 2020-02-16 – 2020-02-23 (×9): 500 mg via ORAL
  Filled 2020-02-13 (×9): qty 1

## 2020-02-13 MED ORDER — ONDANSETRON HCL 4 MG PO TABS: 4.0000 mg | ORAL_TABLET | Freq: Four times a day (QID) | ORAL | Status: DC | PRN

## 2020-02-13 MED ORDER — DIAZEPAM 5 MG PO TABS
5.0000 mg | ORAL_TABLET | Freq: Four times a day (QID) | ORAL | Status: DC | PRN
  Administered 2020-02-14 – 2020-02-15 (×3): 5 mg via ORAL
  Filled 2020-02-13 (×4): qty 1

## 2020-02-13 MED ORDER — ARIPIPRAZOLE 10 MG PO TABS
5.0000 mg | ORAL_TABLET | Freq: Every day | ORAL | Status: DC
  Administered 2020-02-21: 5 mg via ORAL
  Filled 2020-02-13 (×7): qty 1

## 2020-02-13 MED ORDER — ACETAMINOPHEN 325 MG PO TABS: 650.0000 mg | ORAL_TABLET | ORAL | Status: DC | PRN

## 2020-02-13 MED ORDER — LOSARTAN POTASSIUM 50 MG PO TABS
100.0000 mg | ORAL_TABLET | Freq: Every day | ORAL | Status: DC
  Administered 2020-02-15 – 2020-02-24 (×10): 100 mg via ORAL
  Filled 2020-02-13 (×11): qty 2

## 2020-02-13 MED ORDER — CLONIDINE HCL 0.1 MG PO TABS
0.1000 mg | ORAL_TABLET | Freq: Every day | ORAL | Status: DC
  Administered 2020-02-15 – 2020-02-24 (×10): 0.1 mg via ORAL
  Filled 2020-02-13 (×11): qty 1

## 2020-02-13 MED ORDER — GABAPENTIN 400 MG PO CAPS
400.0000 mg | ORAL_CAPSULE | Freq: Four times a day (QID) | ORAL | Status: DC
  Administered 2020-02-14 – 2020-02-24 (×39): 400 mg via ORAL
  Filled 2020-02-13 (×39): qty 1

## 2020-02-13 MED ORDER — SENNOSIDES-DOCUSATE SODIUM 8.6-50 MG PO TABS
1.0000 | ORAL_TABLET | Freq: Every evening | ORAL | Status: DC | PRN
  Administered 2020-02-14: 1 via ORAL
  Filled 2020-02-13: qty 1

## 2020-02-13 MED ORDER — DOCUSATE SODIUM 100 MG PO CAPS
100.0000 mg | ORAL_CAPSULE | Freq: Two times a day (BID) | ORAL | Status: DC
  Administered 2020-02-14 – 2020-02-24 (×20): 100 mg via ORAL
  Filled 2020-02-13 (×21): qty 1

## 2020-02-13 MED ORDER — LAMOTRIGINE 100 MG PO TABS
100.0000 mg | ORAL_TABLET | Freq: Two times a day (BID) | ORAL | Status: DC
  Administered 2020-02-14 – 2020-02-24 (×21): 100 mg via ORAL
  Filled 2020-02-13 (×14): qty 1
  Filled 2020-02-13: qty 4
  Filled 2020-02-13 (×8): qty 1

## 2020-02-13 MED ORDER — PHENOL 1.4 % MT LIQD
1.0000 | OROMUCOSAL | Status: DC | PRN
  Filled 2020-02-13: qty 177

## 2020-02-13 MED ORDER — MENTHOL 3 MG MT LOZG
1.0000 | LOZENGE | OROMUCOSAL | Status: DC | PRN
  Filled 2020-02-13: qty 9

## 2020-02-13 MED ORDER — SODIUM CHLORIDE 0.9% FLUSH: 3.0000 mL | INTRAVENOUS | Status: DC | PRN

## 2020-02-13 MED ORDER — METHOCARBAMOL 1000 MG/10ML IJ SOLN
INTRAMUSCULAR | Status: AC
  Filled 2020-02-13: qty 10

## 2020-02-13 NOTE — ED Notes (Signed)
Called [facility] for robaxin

## 2020-02-13 NOTE — ED Provider Notes (Signed)
[facility] EMERGENCY DEPARTMENT Provider Note  CSN: [id number] Arrival date & time: 02/13/20 1834    History Chief Complaint  Patient presents with  . Hip Pain    HPI  Carrie Cook is a 60 y.o. female here for lower back pain and L foot numbness/weakness. She was recently diagnosed with lumbar disc disease based on MRI done in late December   IMPRESSION:  Moderate to severe spinal stenosis L3-4 with moderate subarticular  stenosis bilaterally.   Shallow right paracentral disc protrusion L4-5 with right L5 nerve  root impingement. Mild to moderate spinal stenosis.   She had what sounds like a microdiscectomy as an outpatient 2 days ago. She began having severe lower back muscle spasms after getting home that night. She had previous had some tingling on her R foot but post-op began to have tingling on her L foot and then developed a foot drop. She had a fall yesterday due to the pain and her L leg giving out. She had some urinary retention immediately post-op but reports she has been able to urinate normally today. Denies any fever. She called the on-call Neurosurgeon today who recommended she go to the [facility] ED for evaluation but she was unable to get in the car and so EMS was called and she was brought here to [facility]. She has been taking her prescribed pain medication without improvement.    Past Medical History:  Diagnosis Date  . Anemia    PT HAS HAD COLONOSCOPY AND ENDOSCOPY WORK UP - NO PROBLEMS FOUND AS SOURCE OF ANEMIA -- PT HAD IRON INFUSION AT [facility] 11/13/12-AFTER SEEING HEMATOLOGIST DR. Pemberwick AND HE GAVE HEMATOLOGIC CLEARANCE FOR GASTRIC BYPASS SURGERY.  Marland Kitchen Anxiety   . Asthma    daily and prn inhalers  . Degenerative joint disease    right knee, spine - STATES INTERMITTENT  NUMBNESS DOWN RT LEG WITH PROLONGED STANDING OR WALKING- THINKS RELATED TO HER SPINE PROBLEMS  . Dental crowns present   . Depression   . Family history of anesthesia complication     pt's mother and sister have hx. of post-op N/V  . Fibroids    UTERINE  . Gastroesophageal reflux disease    none for 2 years since Bariatric surgery.  . H/O hiatal hernia   . History of endometriosis   . History of kidney stones   . Hyperlipidemia   . Hypertension    under control with med., has been on med. x 2 yr.  . Insulin dependent diabetes mellitus   . Lock jaw    jaw locks open if opens mouth wide  . Migraines   . Neuropathy    FEET  . Obesity   . Palpitations   . PONV (postoperative nausea and vomiting)   . Shortness of breath    with daily activities  . Sleep apnea    post-op didn't need CPAP but now states needs it again but not using    Past Surgical History:  Procedure Laterality Date  . ABDOMINAL HYSTERECTOMY N/A 06/06/2017   Procedure: HYSTERECTOMY ABDOMINAL;  Surgeon: Florian Buff, MD;  Location: [facility];  Service: Gynecology;  Laterality: N/A;  . BACK SURGERY    . BREATH TEK H PYLORI N/A 08/13/2012   Procedure: BREATH TEK H PYLORI;  Surgeon: Edward Jolly, MD;  Location: [facility];  Service: General;  Laterality: N/A;  . CARPAL TUNNEL RELEASE  01/03/2012   Procedure: CARPAL TUNNEL RELEASE;  Surgeon: Wynonia Sours,  MD;  Location: [facility];  Service: Orthopedics;  Laterality: Right;  . carpal tunnel release right hand Right 12/13  . COLONOSCOPY  05/2009   RRN:[id number] hemorrhoids otherwise normal colon, rectum and terminal ileum  . COLONOSCOPY WITH ESOPHAGOGASTRODUODENOSCOPY (EGD) N/A 10/28/2012   Procedure: COLONOSCOPY WITH ESOPHAGOGASTRODUODENOSCOPY (EGD);  Surgeon: Corbin Ade, MD;  Location: [facility];  Service: Endoscopy;  Laterality: N/A;  7:30  . DILITATION & CURRETTAGE/HYSTROSCOPY WITH NOVASURE ABLATION N/A 07/22/2014   Procedure: DILATATION & CURETTAGE/HYSTEROSCOPY WITH NOVASURE ABLATION; uterine length 6.0 cm; uterine width 3.8 cm; total ablation time 1 minute 15 seconds;  Surgeon: Lazaro Arms, MD;  Location: [facility];   Service: Gynecology;  Laterality: N/A;  . ESOPHAGOGASTRODUODENOSCOPY  5/013/2011   BFX:OVANVB esophagus/multiple polyps removed s/p (hyperplastic). Gastritis without H.pylori.  . ESOPHAGOGASTRODUODENOSCOPY (EGD) WITH PROPOFOL N/A 06/06/2018   with LA Grade A esophagitis s/p dilation.   Marland Kitchen GASTRIC ROUX-EN-Y N/A 11/19/2012   Procedure: LAPAROSCOPIC ROUX-EN-Y GASTRIC;  Surgeon: Mariella Saa, MD;  Location: [facility];  Service: General;  Laterality: N/A;  . GIVENS CAPSULE STUDY N/A 10/28/2012   Procedure: GIVENS CAPSULE STUDY;  Surgeon: Corbin Ade, MD;  [facility]: [facility];  Service: Endoscopy;  Laterality: N/A;  . KNEE ARTHROSCOPY WITH MEDIAL MENISECTOMY Right 10/28/2015   Procedure: RIGHT KNEE ARTHROSCOPY WITH MEDIAL MENISECTOMY;  Surgeon: Vickki Hearing, MD;  Location: [facility];  Service: Orthopedics;  Laterality: Right;  . LAPAROSCOPY     due to endometriosis  . MALONEY DILATION N/A 06/06/2018   Procedure: Elease Hashimoto DILATION;  Surgeon: Corbin Ade, MD;  Location: [facility];  Service: Endoscopy;  Laterality: N/A;  . PELVIC LAPAROSCOPY  11/04/1999   with fulguration of endometriosis  . SALPINGOOPHORECTOMY Bilateral 06/06/2017   Procedure: SALPINGO OOPHORECTOMY;  Surgeon: Lazaro Arms, MD;  Location: [facility];  Service: Gynecology;  Laterality: Bilateral;  . TOTAL KNEE ARTHROPLASTY Right 07/22/2019   Procedure: TOTAL KNEE ARTHROPLASTY;  Surgeon: Vickki Hearing, MD;  Location: [facility];  Service: Orthopedics;  Laterality: Right;  . TRIGGER FINGER RELEASE Right 09/24/2012   Procedure: RELEASE A-1 PULLEY OF RIGHT THUMB;  Surgeon: Nicki Reaper, MD;  Location: [facility];  Service: Orthopedics;  Laterality: Right;  . TUBAL LIGATION  1993    Family History  Problem Relation Age of Onset  . Hypertension Father   . Hyperlipidemia Father   . COPD Father   . COPD Mother   . Heart disease Mother   . Cancer Mother        Cervix  . Anesthesia problems Mother         post-op N/V  . Thyroid disease Mother   . Diabetes Maternal Grandfather   . Thyroid disease Sister   . Anesthesia problems Sister        post-op N/V  . Thyroid disease Paternal Uncle   . Diabetes Other   . Colon cancer Neg Hx   . Colon polyps Neg Hx     Social History   Tobacco Use  . Smoking status: Never Smoker  . Smokeless tobacco: Never Used  Vaping Use  . Vaping Use: Never used  Substance Use Topics  . Alcohol use: No  . Drug use: No     Home Medications Prior to Admission medications   Medication Sig Start Date End Date Taking? Authorizing Provider  acetaminophen (TYLENOL) 500 MG tablet Take 1 tablet (500 mg total) by mouth every 6 (six) hours as  needed. 09/11/19   Avegno, Darrelyn Hillock, FNP  albuterol (PROVENTIL HFA;VENTOLIN HFA) 108 (90 Base) MCG/ACT inhaler Inhale 2 puffs into the lungs every 6 (six) hours as needed for wheezing. 03/20/17   Kathyrn Drown, MD  amLODipine (NORVASC) 2.5 MG tablet TAKE 1 TABLET BY MOUTH DAILY 11/28/19   Kathyrn Drown, MD  ARIPiprazole (ABILIFY) 5 MG tablet  09/03/19   [provider]  Calcium Citrate 200 MG TABS Take 400 mg by mouth daily.    [provider]  cetirizine (ZYRTEC) 10 MG tablet Take 10 mg by mouth daily.    [provider]  cloNIDine (CATAPRES) 0.1 MG tablet Take by mouth. 01/04/20 01/03/21  [provider]  diazepam (VALIUM) 5 MG tablet Take 1 tablet (5 mg total) by mouth every 6 (six) hours as needed. 10/09/19   Carole Civil, MD  ergocalciferol (VITAMIN D2) 1.25 MG (50000 UT) capsule Take 1 capsule (50,000 Units total) by mouth once a week. Patient taking differently: Take 50,000 Units by mouth every Wednesday. 06/18/19   Lockamy, Theresia Lo, NP-C  estradiol (ESTRACE) 2 MG tablet Take 1 tablet (2 mg total) by mouth at bedtime. Patient taking differently: Take 2 mg by mouth every evening. 07/10/19   Florian Buff, MD  fluticasone (FLOVENT HFA) 220 MCG/ACT inhaler Inhale 2 puffs into the  lungs 2 (two) times daily. Patient taking differently: Inhale 2 puffs into the lungs daily as needed (Shortness of breath). 03/20/17   Kathyrn Drown, MD  gabapentin (NEURONTIN) 400 MG capsule Take 1 capsule (400 mg total) by mouth 4 (four) times daily. 01/07/20 02/06/20  Carole Civil, MD  HYDROcodone-acetaminophen [facility]) 10-325 MG tablet Take by mouth. 01/04/20   [provider]  indomethacin (INDOCIN) 25 MG capsule TAKE 1 CAPSULE(25 MG) BY MOUTH THREE TIMES DAILY WITH MEALS Patient not taking: Reported on 01/26/2020 11/06/19   Carole Civil, MD  insulin glargine (LANTUS SOLOSTAR) 100 UNIT/ML Solostar Pen Use 34 units qhs. May titrate to 40 units qhs 10/06/19   Luking, Elayne Snare, MD  Insulin Pen Needle (PEN NEEDLES) 32G X 4 MM MISC 32 g by Does not apply route daily. 01/30/19   Kathyrn Drown, MD  lamoTRIgine (LAMICTAL) 100 MG tablet Take 100 mg by mouth 2 (two) times daily.  04/30/19   [provider]  losartan (COZAAR) 100 MG tablet TAKE 1 TABLET BY MOUTH DAILY 01/12/20   Kathyrn Drown, MD  Melatonin 3 MG SUBL Place 3 mg under the tongue at bedtime.     [provider]  metFORMIN (GLUCOPHAGE) 500 MG tablet TAKE 1 TABLET(500 MG) BY MOUTH TWICE DAILY WITH A MEAL 01/28/20   Luking, Elayne Snare, MD  methocarbamol (ROBAXIN) 750 MG tablet Take 1 tablet (750 mg total) by mouth 4 (four) times daily. 01/07/20   Carole Civil, MD  Multiple Vitamin (MULTIVITAMIN WITH MINERALS) TABS tablet Take 1 tablet by mouth daily.     [provider]  rosuvastatin (CRESTOR) 5 MG tablet TAKE 1 TABLET BY MOUTH EVERY DAY 08/28/19   Kathyrn Drown, MD  venlafaxine (EFFEXOR) 75 MG tablet Take 75 mg by mouth 2 (two) times daily with a meal.  12/07/13   [provider]     Allergies    Qvar [beclomethasone], Sulfa antibiotics, Tizanidine, Prozac [fluoxetine hcl], Ace inhibitors, Erythromycin ethylsuccinate, Fluoxetine hcl, Hydrochlorothiazide, Lipitor [atorvastatin  calcium], Dyazide [hydrochlorothiazide w-triamterene], Erythromycin, Lisinopril, Pravastatin, and Sulfonamide derivatives   Review of Systems  Review of Systems A comprehensive review of systems was completed and negative except as noted in HPI.    Physical Exam BP (!) 187/93 Comment: Notified RN  Pulse (!) 111   Temp 99.2 F (37.3 C) (Oral)   Resp 16   Ht 5\' 3"  (1.6 m)   Wt 87.5 kg   LMP 07/10/2013   SpO2 97%   BMI 34.19 kg/m   Physical Exam Vitals and nursing note reviewed.  Constitutional:      Appearance: Normal appearance.  HENT:     Head: Normocephalic and atraumatic.     Nose: Nose normal.     Mouth/Throat:     Mouth: Mucous membranes are moist.  Eyes:     Extraocular Movements: Extraocular movements intact.     Conjunctiva/sclera: Conjunctivae normal.  Cardiovascular:     Rate and Rhythm: Normal rate.  Pulmonary:     Effort: Pulmonary effort is normal.     Breath sounds: Normal breath sounds.  Abdominal:     General: Abdomen is flat.     Palpations: Abdomen is soft.     Tenderness: There is no abdominal tenderness.  Musculoskeletal:        General: No swelling. Normal range of motion.     Cervical back: Neck supple.     Comments: Surgical incision is clean, dry and intact without signs of bleeding or infection  Skin:    General: Skin is warm and dry.  Neurological:     Mental Status: She is alert.     Comments: Subjective decreased sensation to bilateral great toes, L foot drop, plantar flexion is normal  Psychiatric:        Mood and Affect: Mood normal.      ED Results / Procedures / Treatments   Labs (all labs ordered are listed, but only abnormal results are displayed) Labs Reviewed  SARS CORONAVIRUS 2 BY RT PCR (HOSPITAL ORDER, [place] [facility]) - Abnormal; Notable for the following components:      Result Value   SARS Coronavirus 2 POSITIVE (*)    All other components within normal limits  CBC - Abnormal; Notable for  the following components:   RDW 11.3 (*)    All other components within normal limits  BASIC METABOLIC PANEL - Abnormal; Notable for the following components:   Glucose, Bld 139 (*)    Calcium 8.7 (*)    All other components within normal limits  URINALYSIS, ROUTINE W REFLEX MICROSCOPIC - Abnormal; Notable for the following components:   Hgb urine dipstick SMALL (*)    Ketones, ur 20 (*)    Leukocytes,Ua TRACE (*)    Bacteria, UA RARE (*)    All other components within normal limits  CBG MONITORING, ED - Abnormal; Notable for the following components:   Glucose-Capillary 136 (*)    All other components within normal limits    EKG None  Radiology MR Lumbar Spine W Wo Contrast  Result Date: 02/14/2020 CLINICAL DATA:  Lumbar radiculopathy, prior surgery, new symptoms left drop foot, s/p R L3-4 L4-5 microdisc EXAM: MRI LUMBAR SPINE WITHOUT AND WITH CONTRAST TECHNIQUE: Multiplanar and multiecho pulse sequences of the lumbar spine were obtained without and with intravenous contrast. CONTRAST:  35mL GADAVIST GADOBUTROL 1 MMOL/ML IV SOLN COMPARISON:  01/20/2020 MRI lumbar spine. FINDINGS: Segmentation:  Standard. Alignment:  Minimal grade 1 L3-4 anterolisthesis, unchanged. Vertebrae: Mild Modic type 2 endplate degenerative changes. Vertebral body heights are preserved. Scattered hemangiomata versus focal fat. Conus  medullaris and cauda equina: Conus extends to the L2 level. Conus and cauda equina appear normal. Disc levels: Multilevel desiccation. T12-L1: Minimal disc bulge and facet degenerative spurring. Patent spinal canal and neural foramen. L1-2: Mild disc bulge and bilateral facet hypertrophy. Patent spinal canal. Mild bilateral neural foraminal narrowing. L2-3: Mild disc bulge and bilateral facet hypertrophy. Patent spinal canal and neural foramen. L3-4: Sequela of right laminotomy and partial discectomy. Prominence of the epidural fat. Decreased conspicuity of prior central protrusion. Mild  disc bulge and bilateral facet hypertrophy. Edema involving the right paracentral disc may reflect postprocedural sequela. Severe spinal canal narrowing, more conspicuous than prior exam. Mild bilateral neural foraminal narrowing, unchanged. L4-5: Sequela of right laminotomy and partial discectomy. Decreased conspicuity of right paracentral protrusion with minimal edema involving the right paracentral disc. Prominence of the epidural fat. Mild disc bulge and bilateral facet degenerative spurring, otherwise unchanged. Severe spinal canal narrowing, more conspicuous than prior exam. Mild bilateral neural foraminal narrowing. L5-S1: Mild disc bulge and central protrusion/annular fissuring. Bilateral facet hypertrophy. Patent spinal canal and neural foramen. Paraspinal and other soft tissues: Postsurgical appearance of the paraspinal soft tissues. IMPRESSION: Sequela of interval right laminotomy and partial discectomy at the L3-4, L4-5 levels. Severe spinal canal narrowing at the L3-5 levels, more conspicuous than prior exam. Increased conspicuity of the epidural fat, contributing to stenosis at these levels may reflect postprocedural edema. Multilevel spondylosis, otherwise unchanged. Electronically Signed   By: Primitivo Gauze M.D.   On: 02/14/2020 12:01    Procedures Procedures  Medications Ordered in the ED Medications  multivitamin with minerals tablet 1 tablet (has no administration in time range)  venlafaxine (EFFEXOR) tablet 75 mg (has no administration in time range)  loratadine (CLARITIN) tablet 10 mg (has no administration in time range)  albuterol (VENTOLIN HFA) 108 (90 Base) MCG/ACT inhaler 2 puff (has no administration in time range)  lamoTRIgine (LAMICTAL) tablet 100 mg (100 mg Oral Given 02/14/20 0123)  estradiol (ESTRACE) tablet 2 mg (has no administration in time range)  diazepam (VALIUM) tablet 5 mg (5 mg Oral Given 02/14/20 0126)  amLODipine (NORVASC) tablet 2.5 mg (has no  administration in time range)  methocarbamol (ROBAXIN) tablet 750 mg (0 mg Oral Hold 02/14/20 0124)  gabapentin (NEURONTIN) capsule 400 mg (400 mg Oral Given 02/14/20 0124)  losartan (COZAAR) tablet 100 mg (has no administration in time range)  metFORMIN (GLUCOPHAGE) tablet 500 mg (has no administration in time range)  rosuvastatin (CRESTOR) tablet 5 mg (has no administration in time range)  cloNIDine (CATAPRES) tablet 0.1 mg (has no administration in time range)  ARIPiprazole (ABILIFY) tablet 5 mg (has no administration in time range)  0.9 %  sodium chloride infusion ( Intravenous New Bag/Given 02/14/20 0058)  sodium chloride flush (NS) 0.9 % injection 3 mL (3 mLs Intravenous Given 02/14/20 0122)  sodium chloride flush (NS) 0.9 % injection 3 mL (has no administration in time range)  0.9 %  sodium chloride infusion (0 mLs Intravenous Hold 02/14/20 0126)  acetaminophen (TYLENOL) tablet 1,000 mg (1,000 mg Oral Given 02/14/20 0549)  acetaminophen (TYLENOL) tablet 650 mg (has no administration in time range)    Or  acetaminophen (TYLENOL) suppository 650 mg (has no administration in time range)  HYDROcodone-acetaminophen (NORCO/VICODIN) 5-325 MG per tablet 1 tablet (has no administration in time range)  oxyCODONE (Oxy IR/ROXICODONE) immediate release tablet 5-10 mg (has no administration in time range)  HYDROmorphone (DILAUDID) injection 0.5-1 mg (1 mg Intravenous Given 02/14/20 0850)  methocarbamol (ROBAXIN) tablet  500 mg (has no administration in time range)    Or  methocarbamol (ROBAXIN) 500 mg in dextrose 5 % 50 mL IVPB (has no administration in time range)  docusate sodium (COLACE) capsule 100 mg (100 mg Oral Given 02/14/20 0124)  senna (SENOKOT) tablet 8.6 mg (8.6 mg Oral Not Given 02/14/20 0126)  senna-docusate (Senokot-S) tablet 1 tablet (1 tablet Oral Given 02/14/20 0123)  bisacodyl (DULCOLAX) suppository 10 mg (has no administration in time range)  sodium phosphate (FLEET) 7-19 GM/118ML enema 1  enema (has no administration in time range)  ondansetron (ZOFRAN) tablet 4 mg ( Oral See Alternative 02/14/20 0118)    Or  ondansetron (ZOFRAN) injection 4 mg (4 mg Intravenous Given 02/14/20 0118)  menthol-cetylpyridinium (CEPACOL) lozenge 3 mg (has no administration in time range)    Or  phenol (CHLORASEPTIC) mouth spray 1 spray (has no administration in time range)  budesonide (PULMICORT) nebulizer solution 0.25 mg (has no administration in time range)  fentaNYL (SUBLIMAZE) injection 50 mcg (50 mcg Intravenous Given 02/13/20 2217)  methocarbamol (ROBAXIN) 500 mg in dextrose 5 % 50 mL IVPB (0 mg Intravenous Stopped 02/14/20 0129)  gadobutrol (GADAVIST) 1 MMOL/ML injection 9 mL (9 mLs Intravenous Contrast Given 02/14/20 1131)     MDM Rules/Calculators/A&P MDM Pain meds and muscle relaxers for comfort. With new foot drop contralateral to surgical site, will discuss with Neurosurgery.  ED Course  I have reviewed the triage vital signs and the nursing notes.  Pertinent labs & imaging results that were available during my care of the patient were reviewed by me and considered in my medical decision making (see chart for details).  Clinical Course as of 02/14/20 1239  Fri Feb 13, 2020  [id number] Spoke with Neurosurgery PA at [facility] who is familiar with the patient. He will place admission orders and plan for MRI tomorrow. Patient aware of the plan.  [CS]    Clinical Course User Index [CS] Truddie Hidden, MD    Final Clinical Impression(s) / ED Diagnoses Final diagnoses:  Left foot drop  Post-operative pain    Rx / DC Orders ED Discharge Orders    None       Truddie Hidden, MD 02/14/20 1239

## 2020-02-13 NOTE — ED Triage Notes (Addendum)
Pt had back surgery 2 days ago and fell yesterday. Pt c/o muscle spasms, pain in hips and "numbness" in feet. Pt states spasms started the day of surgery. Per EMS, pt was ambulatory on scene.

## 2020-02-13 NOTE — Progress Notes (Signed)
  NEUROSURGERY PROGRESS NOTE   Received call from [facility]P [facility] regarding patient. 60 year old s/p L3-4 L4-5 R MIS discectomy for right lumbar radiculopathy and right sided foot drop in setting of large lumbar HNPs this past [place]nes[place] 1/19 by Dr Zada Finders at the outpatient surgical center.  She was doing well until today when she developed severe pain on the contralateral side with new left foot drop. She will need to be admitted to [facility] for further work and management including MRI L spine. Keep NPO for now. Full H&P to follow.

## 2020-02-14 ENCOUNTER — Encounter (HOSPITAL_COMMUNITY): Payer: Self-pay | Admitting: Neurosurgery

## 2020-02-14 ENCOUNTER — Inpatient Hospital Stay (HOSPITAL_COMMUNITY): Payer: Medicare Other

## 2020-02-14 ENCOUNTER — Inpatient Hospital Stay (HOSPITAL_COMMUNITY): Payer: Medicare Other | Admitting: Certified Registered Nurse Anesthetist

## 2020-02-14 ENCOUNTER — Encounter (HOSPITAL_COMMUNITY)
Admission: EM | Disposition: A | Discharge status: Rehabilitation Facility | Payer: Self-pay | Source: Home / Self Care | Attending: Neurological Surgery

## 2020-02-14 DIAGNOSIS — I10 Essential (primary) hypertension: Secondary | ICD-10-CM | POA: Diagnosis not present

## 2020-02-14 DIAGNOSIS — D509 Iron deficiency anemia, unspecified: Secondary | ICD-10-CM | POA: Diagnosis not present

## 2020-02-14 DIAGNOSIS — M48061 Spinal stenosis, lumbar region without neurogenic claudication: Secondary | ICD-10-CM

## 2020-02-14 DIAGNOSIS — E785 Hyperlipidemia, unspecified: Secondary | ICD-10-CM | POA: Diagnosis not present

## 2020-02-14 DIAGNOSIS — M21372 Foot drop, left foot: Secondary | ICD-10-CM

## 2020-02-14 HISTORY — PX: LUMBAR LAMINECTOMY/DECOMPRESSION MICRODISCECTOMY: SHX5026

## 2020-02-14 LAB — HEMOGLOBIN A1C
Hgb A1c MFr Bld: 6.4 % — ABNORMAL HIGH (ref 4.8–5.6)
Mean Plasma Glucose: 136.98 mg/dL

## 2020-02-14 LAB — URINALYSIS, ROUTINE W REFLEX MICROSCOPIC
Bilirubin Urine: NEGATIVE
Glucose, UA: NEGATIVE mg/dL
Ketones, ur: 20 mg/dL — AB
Nitrite: NEGATIVE
Protein, ur: NEGATIVE mg/dL
Specific Gravity, Urine: 1.016 (ref 1.005–1.030)
pH: 5 (ref 5.0–8.0)

## 2020-02-14 LAB — SARS CORONAVIRUS 2 BY RT PCR (HOSPITAL ORDER, PERFORMED IN ~~LOC~~ HOSPITAL LAB): SARS Coronavirus 2: POSITIVE — AB

## 2020-02-14 LAB — GLUCOSE, CAPILLARY: Glucose-Capillary: 190 mg/dL — ABNORMAL HIGH (ref 70–99)

## 2020-02-14 SURGERY — LUMBAR LAMINECTOMY/DECOMPRESSION MICRODISCECTOMY 2 LEVELS
Anesthesia: General | Site: Back

## 2020-02-14 MED ORDER — OXYCODONE HCL 5 MG PO TABS
5.0000 mg | ORAL_TABLET | Freq: Once | ORAL | Status: DC | PRN
Start: 2020-02-14 — End: 2020-02-14

## 2020-02-14 MED ORDER — FENTANYL CITRATE (PF) 100 MCG/2ML IJ SOLN
25.0000 ug | INTRAMUSCULAR | Status: DC | PRN
Start: 2020-02-14 — End: 2020-02-14

## 2020-02-14 MED ORDER — PROPOFOL 10 MG/ML IV BOLUS
INTRAVENOUS | Status: AC
  Filled 2020-02-14: qty 20

## 2020-02-14 MED ORDER — LIDOCAINE 2% (20 MG/ML) 5 ML SYRINGE
INTRAMUSCULAR | Status: DC | PRN
  Administered 2020-02-14: 60 mg via INTRAVENOUS

## 2020-02-14 MED ORDER — PROPOFOL 10 MG/ML IV BOLUS
INTRAVENOUS | Status: DC | PRN
  Administered 2020-02-14: 170 mg via INTRAVENOUS
  Administered 2020-02-14: 30 mg via INTRAVENOUS

## 2020-02-14 MED ORDER — MIDAZOLAM HCL 2 MG/2ML IJ SOLN
INTRAMUSCULAR | Status: AC
  Filled 2020-02-14: qty 2

## 2020-02-14 MED ORDER — GADOBUTROL 1 MMOL/ML IV SOLN
9.0000 mL | Freq: Once | INTRAVENOUS | Status: AC | PRN
  Administered 2020-02-14: 9 mL via INTRAVENOUS

## 2020-02-14 MED ORDER — CEFAZOLIN SODIUM-DEXTROSE 2-3 GM-%(50ML) IV SOLR
INTRAVENOUS | Status: DC | PRN
  Administered 2020-02-14: 2 g via INTRAVENOUS

## 2020-02-14 MED ORDER — HEMOSTATIC AGENTS (NO CHARGE) OPTIME
TOPICAL | Status: DC | PRN
  Administered 2020-02-14: 1 via TOPICAL

## 2020-02-14 MED ORDER — FENTANYL CITRATE (PF) 250 MCG/5ML IJ SOLN
INTRAMUSCULAR | Status: AC
  Filled 2020-02-14: qty 5

## 2020-02-14 MED ORDER — INSULIN ASPART 100 UNIT/ML ~~LOC~~ SOLN
0.0000 [IU] | Freq: Every day | SUBCUTANEOUS | Status: DC
  Administered 2020-02-23: 2 [IU] via SUBCUTANEOUS

## 2020-02-14 MED ORDER — THROMBIN 5000 UNITS EX SOLR
CUTANEOUS | Status: AC
  Filled 2020-02-14: qty 15000

## 2020-02-14 MED ORDER — DEXAMETHASONE SODIUM PHOSPHATE 10 MG/ML IJ SOLN
INTRAMUSCULAR | Status: AC
  Filled 2020-02-14: qty 1

## 2020-02-14 MED ORDER — LABETALOL HCL 5 MG/ML IV SOLN
5.0000 mg | INTRAVENOUS | Status: DC | PRN
Start: 2020-02-14 — End: 2020-02-14

## 2020-02-14 MED ORDER — 0.9 % SODIUM CHLORIDE (POUR BTL) OPTIME
TOPICAL | Status: DC | PRN
  Administered 2020-02-14: 1000 mL

## 2020-02-14 MED ORDER — SUCCINYLCHOLINE CHLORIDE 20 MG/ML IJ SOLN
INTRAMUSCULAR | Status: DC | PRN
  Administered 2020-02-14: 130 mg via INTRAVENOUS

## 2020-02-14 MED ORDER — LACTATED RINGERS IV SOLN: INTRAVENOUS | Status: DC | PRN

## 2020-02-14 MED ORDER — SODIUM CHLORIDE 0.9 % IV SOLN: INTRAVENOUS | Status: DC

## 2020-02-14 MED ORDER — OXYCODONE HCL 5 MG/5ML PO SOLN: 5.0000 mg | Freq: Once | ORAL | Status: DC | PRN

## 2020-02-14 MED ORDER — CLONIDINE HCL 0.2 MG PO TABS: 0.1000 mg | ORAL_TABLET | Freq: Once | ORAL | Status: DC

## 2020-02-14 MED ORDER — SUGAMMADEX SODIUM 200 MG/2ML IV SOLN
INTRAVENOUS | Status: DC | PRN
  Administered 2020-02-14: 200 mg via INTRAVENOUS

## 2020-02-14 MED ORDER — PROMETHAZINE HCL 25 MG/ML IJ SOLN
INTRAMUSCULAR | Status: AC
  Filled 2020-02-14: qty 1

## 2020-02-14 MED ORDER — THROMBIN 5000 UNITS EX SOLR
OROMUCOSAL | Status: DC | PRN
  Administered 2020-02-14: 5 mL via TOPICAL

## 2020-02-14 MED ORDER — LABETALOL HCL 5 MG/ML IV SOLN
5.0000 mg | Freq: Once | INTRAVENOUS | Status: AC
  Administered 2020-02-14: 5 mg via INTRAVENOUS

## 2020-02-14 MED ORDER — LIDOCAINE-EPINEPHRINE 1 %-1:100000 IJ SOLN
INTRAMUSCULAR | Status: AC
  Filled 2020-02-14: qty 1

## 2020-02-14 MED ORDER — CEFAZOLIN SODIUM-DEXTROSE 2-4 GM/100ML-% IV SOLN
INTRAVENOUS | Status: AC
  Filled 2020-02-14: qty 100

## 2020-02-14 MED ORDER — EPHEDRINE SULFATE-NACL 50-0.9 MG/10ML-% IV SOSY
PREFILLED_SYRINGE | INTRAVENOUS | Status: DC | PRN
  Administered 2020-02-14: 5 mg via INTRAVENOUS

## 2020-02-14 MED ORDER — SODIUM CHLORIDE 0.9% FLUSH: 3.0000 mL | INTRAVENOUS | Status: DC | PRN

## 2020-02-14 MED ORDER — SODIUM CHLORIDE 0.9 % IV SOLN: 250.0000 mL | INTRAVENOUS | Status: DC

## 2020-02-14 MED ORDER — DEXAMETHASONE SODIUM PHOSPHATE 10 MG/ML IJ SOLN
INTRAMUSCULAR | Status: DC | PRN
  Administered 2020-02-14: 5 mg via INTRAVENOUS

## 2020-02-14 MED ORDER — PHENYLEPHRINE HCL-NACL 10-0.9 MG/250ML-% IV SOLN
INTRAVENOUS | Status: DC | PRN
  Administered 2020-02-14: 25 ug/min via INTRAVENOUS

## 2020-02-14 MED ORDER — THROMBIN 5000 UNITS EX SOLR
CUTANEOUS | Status: DC | PRN
  Administered 2020-02-14 (×2): 5000 [IU] via TOPICAL

## 2020-02-14 MED ORDER — CEFAZOLIN SODIUM-DEXTROSE 2-4 GM/100ML-% IV SOLN
2.0000 g | Freq: Three times a day (TID) | INTRAVENOUS | Status: AC
  Administered 2020-02-14 – 2020-02-15 (×2): 2 g via INTRAVENOUS
  Filled 2020-02-14 (×4): qty 100

## 2020-02-14 MED ORDER — CEFAZOLIN SODIUM-DEXTROSE 2-4 GM/100ML-% IV SOLN: 2.0000 g | INTRAVENOUS | Status: DC

## 2020-02-14 MED ORDER — ROCURONIUM BROMIDE 10 MG/ML (PF) SYRINGE
PREFILLED_SYRINGE | INTRAVENOUS | Status: DC | PRN
  Administered 2020-02-14: 50 mg via INTRAVENOUS
  Administered 2020-02-14: 10 mg via INTRAVENOUS
  Administered 2020-02-14: 20 mg via INTRAVENOUS
  Administered 2020-02-14 (×2): 10 mg via INTRAVENOUS

## 2020-02-14 MED ORDER — BUPIVACAINE HCL (PF) 0.5 % IJ SOLN
INTRAMUSCULAR | Status: AC
  Filled 2020-02-14: qty 30

## 2020-02-14 MED ORDER — SODIUM CHLORIDE 0.9% FLUSH
3.0000 mL | Freq: Two times a day (BID) | INTRAVENOUS | Status: DC
  Administered 2020-02-14 – 2020-02-24 (×17): 3 mL via INTRAVENOUS

## 2020-02-14 MED ORDER — PROMETHAZINE HCL 25 MG/ML IJ SOLN
6.2500 mg | INTRAMUSCULAR | Status: DC | PRN
  Administered 2020-02-14: 6.25 mg via INTRAVENOUS

## 2020-02-14 MED ORDER — PHENYLEPHRINE 40 MCG/ML (10ML) SYRINGE FOR IV PUSH (FOR BLOOD PRESSURE SUPPORT)
PREFILLED_SYRINGE | INTRAVENOUS | Status: DC | PRN
  Administered 2020-02-14: 40 ug via INTRAVENOUS
  Administered 2020-02-14: 80 ug via INTRAVENOUS

## 2020-02-14 MED ORDER — INSULIN ASPART 100 UNIT/ML ~~LOC~~ SOLN
0.0000 [IU] | Freq: Three times a day (TID) | SUBCUTANEOUS | Status: DC
  Administered 2020-02-15 (×2): 5 [IU] via SUBCUTANEOUS
  Administered 2020-02-15: 3 [IU] via SUBCUTANEOUS
  Administered 2020-02-16: 5 [IU] via SUBCUTANEOUS
  Administered 2020-02-16 – 2020-02-18 (×5): 3 [IU] via SUBCUTANEOUS
  Administered 2020-02-18: 2 [IU] via SUBCUTANEOUS
  Administered 2020-02-19: 3 [IU] via SUBCUTANEOUS
  Administered 2020-02-19 – 2020-02-20 (×2): 2 [IU] via SUBCUTANEOUS
  Administered 2020-02-20: 5 [IU] via SUBCUTANEOUS
  Administered 2020-02-21 – 2020-02-23 (×6): 2 [IU] via SUBCUTANEOUS
  Administered 2020-02-23: 5 [IU] via SUBCUTANEOUS
  Administered 2020-02-24: 3 [IU] via SUBCUTANEOUS

## 2020-02-14 MED ORDER — MIDAZOLAM HCL 2 MG/2ML IJ SOLN
INTRAMUSCULAR | Status: DC | PRN
  Administered 2020-02-14: 2 mg via INTRAVENOUS

## 2020-02-14 MED ORDER — FENTANYL CITRATE (PF) 100 MCG/2ML IJ SOLN
INTRAMUSCULAR | Status: AC
  Filled 2020-02-14: qty 4

## 2020-02-14 MED ORDER — FENTANYL CITRATE (PF) 100 MCG/2ML IJ SOLN
INTRAMUSCULAR | Status: DC | PRN
  Administered 2020-02-14: 50 ug via INTRAVENOUS
  Administered 2020-02-14: 25 ug via INTRAVENOUS
  Administered 2020-02-14 (×3): 50 ug via INTRAVENOUS
  Administered 2020-02-14: 75 ug via INTRAVENOUS
  Administered 2020-02-14: 100 ug via INTRAVENOUS
  Administered 2020-02-14 (×2): 50 ug via INTRAVENOUS

## 2020-02-14 SURGICAL SUPPLY — 58 items
ADH SKN CLS APL DERMABOND .7 (GAUZE/BANDAGES/DRESSINGS) ×1
APL SKNCLS STERI-STRIP NONHPOA (GAUZE/BANDAGES/DRESSINGS)
BAND INSRT 18 STRL LF DISP RB (MISCELLANEOUS) ×2
BAND RUBBER #18 3X1/16 STRL (MISCELLANEOUS) ×4 IMPLANT
BENZOIN TINCTURE PRP APPL 2/3 (GAUZE/BANDAGES/DRESSINGS) IMPLANT
BLADE CLIPPER SURG (BLADE) IMPLANT
BLADE SURG 11 STRL SS (BLADE) ×2 IMPLANT
BUR MATCHSTICK NEURO 3.0 LAGG (BURR) IMPLANT
BUR PRECISION FLUTE 5.0 (BURR) IMPLANT
CANISTER SUCT 3000ML PPV (MISCELLANEOUS) ×2 IMPLANT
CARTRIDGE OIL MAESTRO DRILL (MISCELLANEOUS) ×1 IMPLANT
COVER WAND RF STERILE (DRAPES) ×2 IMPLANT
DECANTER SPIKE VIAL GLASS SM (MISCELLANEOUS) ×2 IMPLANT
DERMABOND ADVANCED (GAUZE/BANDAGES/DRESSINGS) ×1
DERMABOND ADVANCED .7 DNX12 (GAUZE/BANDAGES/DRESSINGS) ×1 IMPLANT
DIFFUSER DRILL AIR PNEUMATIC (MISCELLANEOUS) ×2 IMPLANT
DRAPE LAPAROTOMY 100X72X124 (DRAPES) ×2 IMPLANT
DRAPE MICROSCOPE LEICA (MISCELLANEOUS) ×2 IMPLANT
DRAPE SURG 17X23 STRL (DRAPES) ×2 IMPLANT
DRSG OPSITE POSTOP 4X6 (GAUZE/BANDAGES/DRESSINGS) ×1 IMPLANT
DURAPREP 26ML APPLICATOR (WOUND CARE) ×2 IMPLANT
ELECT REM PT RETURN 9FT ADLT (ELECTROSURGICAL) ×2
ELECTRODE REM PT RTRN 9FT ADLT (ELECTROSURGICAL) ×1 IMPLANT
GAUZE 4X4 16PLY RFD (DISPOSABLE) IMPLANT
GAUZE SPONGE 4X4 12PLY STRL (GAUZE/BANDAGES/DRESSINGS) IMPLANT
GLOVE BIO SURGEON STRL SZ7.5 (GLOVE) IMPLANT
GLOVE BIOGEL PI IND STRL 7.5 (GLOVE) ×2 IMPLANT
GLOVE BIOGEL PI INDICATOR 7.5 (GLOVE) ×2
GLOVE ECLIPSE 7.0 STRL STRAW (GLOVE) ×2 IMPLANT
GLOVE EXAM NITRILE XL STR (GLOVE) IMPLANT
GOWN STRL REUS W/ TWL LRG LVL3 (GOWN DISPOSABLE) ×2 IMPLANT
GOWN STRL REUS W/ TWL XL LVL3 (GOWN DISPOSABLE) IMPLANT
GOWN STRL REUS W/TWL 2XL LVL3 (GOWN DISPOSABLE) IMPLANT
GOWN STRL REUS W/TWL LRG LVL3 (GOWN DISPOSABLE) ×12
GOWN STRL REUS W/TWL XL LVL3 (GOWN DISPOSABLE)
HEMOSTAT POWDER KIT SURGIFOAM (HEMOSTASIS) ×2 IMPLANT
KIT BASIN OR (CUSTOM PROCEDURE TRAY) ×2 IMPLANT
KIT TURNOVER KIT B (KITS) ×2 IMPLANT
NDL HYPO 18GX1.5 BLUNT FILL (NEEDLE) IMPLANT
NDL SPNL 18GX3.5 QUINCKE PK (NEEDLE) IMPLANT
NEEDLE HYPO 18GX1.5 BLUNT FILL (NEEDLE) IMPLANT
NEEDLE HYPO 22GX1.5 SAFETY (NEEDLE) ×2 IMPLANT
NEEDLE SPNL 18GX3.5 QUINCKE PK (NEEDLE) ×2 IMPLANT
NS IRRIG 1000ML POUR BTL (IV SOLUTION) ×2 IMPLANT
OIL CARTRIDGE MAESTRO DRILL (MISCELLANEOUS) ×2
PACK LAMINECTOMY NEURO (CUSTOM PROCEDURE TRAY) ×2 IMPLANT
PAD ARMBOARD 7.5X6 YLW CONV (MISCELLANEOUS) ×6 IMPLANT
SPONGE LAP 4X18 RFD (DISPOSABLE) IMPLANT
SPONGE SURGIFOAM ABS GEL SZ50 (HEMOSTASIS) ×2 IMPLANT
STRIP CLOSURE SKIN 1/2X4 (GAUZE/BANDAGES/DRESSINGS) IMPLANT
SUT VIC AB 0 CT1 18XCR BRD8 (SUTURE) ×1 IMPLANT
SUT VIC AB 0 CT1 8-18 (SUTURE) ×2
SUT VIC AB 2-0 CT1 18 (SUTURE) ×1 IMPLANT
SUT VICRYL 3-0 RB1 18 ABS (SUTURE) ×4 IMPLANT
SYR 3ML LL SCALE MARK (SYRINGE) IMPLANT
TOWEL GREEN STERILE (TOWEL DISPOSABLE) ×2 IMPLANT
TOWEL GREEN STERILE FF (TOWEL DISPOSABLE) ×2 IMPLANT
WATER STERILE IRR 1000ML POUR (IV SOLUTION) ×2 IMPLANT

## 2020-02-14 NOTE — Anesthesia Preprocedure Evaluation (Addendum)
Anesthesia Evaluation  Patient identified by MRN, date of birth, ID band Patient awake    Reviewed: Allergy & Precautions, NPO status , Patient's Chart, lab work & pertinent test results  History of Anesthesia Complications (+) PONV and history of anesthetic complications  Airway Mallampati: II  TM Distance: >3 FB Neck ROM: Full    Dental  (+) Dental Advisory Given, Teeth Intact   Pulmonary asthma , sleep apnea ,    Pulmonary exam normal        Cardiovascular hypertension, Pt. on medications  Rhythm:Regular Rate:Tachycardia     Neuro/Psych  Headaches, PSYCHIATRIC DISORDERS Anxiety Depression  Neuromuscular disease    GI/Hepatic Neg liver ROS, hiatal hernia, GERD  Controlled,  Endo/Other  diabetes, Type 2, Oral Hypoglycemic Agents, Insulin Dependent Obesity   Renal/GU negative Renal ROS     Musculoskeletal  (+) Arthritis ,  Hx lock jaw    Abdominal   Peds  Hematology negative hematology ROS (+)   Anesthesia Other Findings Covid+, asymptomatic   Reproductive/Obstetrics                            Anesthesia Physical Anesthesia Plan  ASA: III and emergent  Anesthesia Plan: General   Post-op Pain Management:    Induction: Intravenous and Rapid sequence  PONV Risk Score and Plan: 4 or greater and Treatment may vary due to age or medical condition, Ondansetron, Midazolam and Dexamethasone  Airway Management Planned: Oral ETT and Video Laryngoscope Planned  Additional Equipment: None  Intra-op Plan:   Post-operative Plan: Extubation in OR  Informed Consent: I have reviewed the patients History and Physical, chart, labs and discussed the procedure including the risks, benefits and alternatives for the proposed anesthesia with the patient or authorized representative who has indicated his/her understanding and acceptance.     Dental advisory given  Plan Discussed with: CRNA  and Anesthesiologist  Anesthesia Plan Comments:        Anesthesia Quick Evaluation

## 2020-02-14 NOTE — Transfer of Care (Signed)
Immediate Anesthesia Transfer of Care Note  Patient: Carrie Cook  Procedure(s) Performed: LUMBAR LAMINECTOMY  Lumbar three-four, Lumbar four-five (N/A Back)  Patient Location: PACU and OR 21  Anesthesia Type:General  Level of Consciousness: awake, alert  and oriented  Airway & Oxygen Therapy: Patient Spontanous Breathing and Patient connected to face mask oxygen  Post-op Assessment: Report given to RN, Post -op Vital signs reviewed and stable and Patient moving all extremities X 4  Post vital signs: Reviewed  Last Vitals:  Vitals Value Taken Time  BP 176/95 02/14/20 1722  Temp 36.5 C 02/14/20 1707  Pulse 100 02/14/20 1722  Resp 16 02/14/20 1722  SpO2 98 % 02/14/20 1722    Last Pain:  Vitals:   02/14/20 1722  TempSrc:   PainSc: 6          Complications: No complications documented.  Fransisco Beau called for orders for hypertension/ PACU RN to administer.

## 2020-02-14 NOTE — Anesthesia Procedure Notes (Signed)
Procedure Name: Intubation Date/Time: 02/14/2020 2:18 PM Performed by: Barrington Ellison, CRNA Pre-anesthesia Checklist: Patient identified, Emergency Drugs available, Suction available and Patient being monitored Patient Re-evaluated:Patient Re-evaluated prior to induction Oxygen Delivery Method: Circle System Utilized Preoxygenation: Pre-oxygenation with 100% oxygen Induction Type: IV induction and Rapid sequence Laryngoscope Size: McGraph and 3 Tube type: Oral Tube size: 7.0 mm Number of attempts: 1 Airway Equipment and Method: Stylet and Oral airway Placement Confirmation: ETT inserted through vocal cords under direct vision,  positive ETCO2 and breath sounds checked- equal and bilateral Secured at: 21 cm Tube secured with: Tape Dental Injury: Teeth and Oropharynx as per pre-operative assessment

## 2020-02-14 NOTE — ED Notes (Signed)
Pt reports she fell when she was was pulling up her pants while on the toilet

## 2020-02-14 NOTE — Op Note (Signed)
NEUROSURGERY OPERATIVE NOTE   PREOP DIAGNOSIS:  1. Lumbar spinal stenosis, L3-4, L4-5 2. Foot drop, left   POSTOP DIAGNOSIS: Same  PROCEDURE: 1. Bilateral L3-4, L4-5 laminotomy, medial facetectomy for decompression of thecal sac and nerve roots including microdiscectomy at L4-5 2. Use of intraoperative microscope for microdissection  SURGEON: Dr. Consuella Lose, MD  ASSISTANT: Ferne Reus, PA-C  ANESTHESIA: General Endotracheal  EBL: 100cc  SPECIMENS: None  DRAINS: None  COMPLICATIONS: None immediate  CONDITION: Hemodynamically stable  HISTORY: Carrie Cook is a 60 y.o. female who he underwent right sided minimally invasive L3-4 and L4-5 laminotomy and microdiscectomy about 4 days ago.  She initially presented with right-sided foot drop.  Surgery was done at the outpatient surgical center.  On the night of her surgery while at home she noted significant improvement in her preoperative right-sided leg pain and foot drop however when using the bathroom she noted sudden onset of severe left-sided back and leg pain and left foot weakness.  She presented to [facility] and subsequently was transferred to [facility] where MRI revealed severe stenosis at L3-4 and L4-5.  With her clinical and radiographic findings, surgical decompression was indicated.  The risks, benefits, and alternatives to surgery were reviewed with the patient.  After all her questions were answered informed consent was obtained and witnessed.  PROCEDURE IN DETAIL: The patient was brought to the operating room. After induction of general anesthesia, the patient was positioned on the operative table in the prone position. All pressure points were meticulously padded. Skin incision was then marked out and prepped and draped in the usual sterile fashion.  After timeout was conducted, the previous incision was extended by approximately 2 cm superiorly and inferiorly and carried down through the  subcutaneous tissue.  The lumbodorsal fascia was identified and lateral incisions through the paraspinous musculature were identified from the previous surgery.  The fascia was incised in the midline bilaterally and the spinous process at L4 was identified as well as the L3-4 and L4-5 interspace.  Dissector was placed in the L3-4 interspace on the left side and intraoperative x-ray was taken to confirm our location at the correct level.  Dissection was then carried out on the right side and the laminotomies at both L3-4 and L4-5 were identified.  Self-retaining retractor was then placed at the L3-4 level.  Initially, high-speed drill was used to complete a left-sided laminotomy with a partial medial facetectomy.  The ligamentum flavum was identified and elevated from the thecal sac and removed piecemeal using Kerrison punches.  This allowed identification of the lateral edge of the thecal sac.  I was then able to pass a small ball-tipped dissector within the ventral epidural space and there did appear to be disc material within the ventral epidural space however I was not able to easily remove the material from this left side.  Kerrison punch was used to trace the left L4 nerve root into its foramen.  I also used Kerrison punches to remove the ligamentum flavum and the medial portion of the lamina in order to achieve a good midline decompression.  In addition, curved Kerrisons were used in order to fully decompress the midline.  Attention was then turned to the contralateral side at L3-4.  The thecal sac was identified in the previous laminotomy defect.  Kerrison punches were then used along the right sided L4 nerve root in order to fully decompress this nerve.  A combination of ball-tipped dissectors were then used to  dissect in the ventral epidural space.  There did appear to be several small disc fragments extending just above the level of the disc space.  I therefore elected to extend the right-sided  laminotomy slightly more superiorly.  Ligamentum flavum was removed with Kerrison punches.  I was then able to remove disc fragments primarily from the right side in the ventral epidural space.  Once this was completed I was able to easily pass a small ball-tipped dissector within the ventral epidural space.  Again, a large curved Kerrison was used to remove the ligamentum flavum and the medial portion of the lamina in order to fully decompress the thecal sac.  Once this was done I was able to easily pass a ball-tipped dissector along the dorsal aspect of the thecal sac and visualized it crossing the midline onto the contralateral side indicating good decompression.  Attention was then turned to the more inferior L4-5 level.  Again, starting on the patient's left, laminotomy was completed with a high-speed drill including medial facetectomy.  The ligamentum flavum was identified, elevated, and removed piecemeal with Kerrison punches.  Kerrison punches were then used to trace the L5 nerve root into the foramen.  A ball-tipped dissector was used to dissect the ventral epidural space.  I did not appreciate any ventral compression.  Combination of straight and curved Kerrison punches were then used to remove ligamentum flavum and the medial portion of the lamina.  Attention was then turned to the contralateral side.  Again, the thecal sac was identified in the previous laminotomy.  Kerrison punches were used to extend the decompression inferiorly along the right sided L5 nerve root.  I again dissected in the ventral epidural space and did not note any appreciable ventral compression.  Similar to the contralateral side, combination of straight and curved Kerrisons were used to remove the medial portion of the ligamentum flavum and lamina.  Once this was done I was able to easily pass a long ball-tipped dissector along the dorsal surface of the thecal sac indicating good decompression.  Hemostasis at this point was  easily secured using a combination of bipolar electrocautery and morselized Gelfoam and thrombin.  The Gelfoam was then irrigated away with significant amount of normal saline irrigation.  The wound was then closed in multiple layers in standard fashion using a combination of interrupted 0 and 3-0 Vicryl stitches.  Skin was closed with Dermabond.  After Dermabond had completely dried, sterile dressing was placed.  At the end of the case all sponge, needle, instrument, and cottonoid counts were correct.  Patient was then transferred to the stretcher, extubated, and recovered in the operating room due to her COVID status.   Consuella Lose, MD [facility]

## 2020-02-14 NOTE — Anesthesia Postprocedure Evaluation (Signed)
Anesthesia Post Note  Patient: Carrie Cook  Procedure(s) Performed: LUMBAR LAMINECTOMY  Lumbar three-four, Lumbar four-five (N/A Back)     Patient location during evaluation: PACU Anesthesia Type: General Level of consciousness: awake and alert Pain management: pain level controlled Vital Signs Assessment: post-procedure vital signs reviewed and stable Respiratory status: spontaneous breathing, nonlabored ventilation and respiratory function stable Cardiovascular status: blood pressure returned to baseline, stable and tachycardic Postop Assessment: no apparent nausea or vomiting Anesthetic complications: no   No complications documented.  Last Vitals:  Vitals:   02/14/20 1834 02/14/20 1946  BP: (!) 168/99 (!) 159/90  Pulse: (!) 113 (!) 117  Resp: 13 15  Temp:  37.1 C  SpO2: 96% 95%    Last Pain:  Vitals:   02/14/20 1946  TempSrc: Oral  PainSc:     LLE Motor Response: Purposeful movement (02/14/20 1947) LLE Sensation: Numbness (bilateral feet; baseline) (02/14/20 1947) RLE Motor Response: Purposeful movement (02/14/20 1947) RLE Sensation: Numbness (bilateral feet; baseline) (02/14/20 1947)      Audry Pili

## 2020-02-14 NOTE — ED Notes (Addendum)
Date and time results received: 02/14/20 0334   Test: COVID Critical Value: POSITIVE  Name of Provider Notified: Olevia Bowens, MD, Sedonia Small, MD, [place], [place]

## 2020-02-14 NOTE — ED Provider Notes (Signed)
  Provider Note MRN:  [id number]  Arrival date & time: 02/14/20    ED Course and Medical Decision Making  Assumed care from default provider at shift change.  Patient is admitted by the neurosurgery service, recent microdiscectomy, new foot drop, needing MRI.  Patient had a bed lined up but then tested positive for COVID and now has no bed available at [facility].  Neurosurgery reached out to me, patient needs MRI urgently and so they are requesting ED to ED transfer.  Accepted for transfer by Dr. Dayna Barker to the [facility] emergency department.  Procedures  Final Clinical Impressions(s) / ED Diagnoses     ICD-10-CM   1. Left foot drop  M21.372   2. Post-operative pain  G89.18     ED Discharge Orders    None      Discharge Instructions   None     Barth Kirks. Sedonia Small, [place] [email or web address]    Maudie Flakes, MD 02/14/20 [phone]

## 2020-02-14 NOTE — ED Notes (Signed)
Pt NPO for procedure, medications returned back to pharmacy on the tube system.

## 2020-02-14 NOTE — ED Notes (Signed)
Off floor to MRI

## 2020-02-14 NOTE — ED Notes (Signed)
Pt transferred from another facility with medications ordered and not given by prior nurse. Report just gotten and pt on MRI now.

## 2020-02-14 NOTE — H&P (Signed)
Chief Complaint   Chief Complaint  Patient presents with  . Hip Pain    HPI   Consult requested by: dr Karle Starch, EDP [facility] Reason for consult: Post op problem, left foot drop  HPI: Carrie Cook is a 60 y.o. female who presented to [facility] ED yesterday evening for severe LBP, left leg pain and left foot weakness. She is s/p L3-4 L4-5 R MIS discectomy for right lumbar radiculopathy and right sided foot drop in setting of large lumbar HNPs this past Wesnesay 1/19 by Dr Zada Finders at the outpatient surgical center.  She was doing well initially, but Wednesday evening/Thursday am developed symptoms on the contralateral side. Associated with left foot DF weakness. She has severe muscle spasms that prevent ambulation. Pre op RLE symptoms resolved. Dr Zada Finders recommended she come to the ED for evaluation and admission. Patient initially went to [facility] and was transferred early this am for admission and MRI.   She is COVID positive.  Patient Active Problem List   Diagnosis Date Noted  . Lumbar radiculopathy 02/13/2020  . Acute right-sided back pain with sciatica 10/02/2019  . S/P total knee replacement, right 07/22/19  09/01/2019  . Primary localized osteoarthritis of left knee 07/22/2019  . Primary osteoarthritis of right knee   . Constipation 09/05/2018  . Abdominal pain, chronic, epigastric 05/27/2018  . Dysphagia 05/27/2018  . Depression, major, single episode, moderate ([place]) 03/22/2018  . S/P complete hysterectomy 06/06/2017  . Derangement of posterior horn of medial meniscus of right knee   . Right knee pain 09/10/2015  . Iron deficiency anemia 10/16/2013  . Fibroids 08/21/2013  . Excessive or frequent menstruation 06/19/2013  . Morbid obesity ([place]) 07/12/2012  . Obstructive sleep apnea 06/21/2012  . Chest pain   . Hyperlipidemia   . Type 2 diabetes mellitus ([place])   . Obesity   . Laboratory test   . Nephrolithiasis   . Asthma   . Endometriosis   . Hypertension   . Iron deficiency  anemia 05/11/2009  . GERD 05/11/2009    PMH: Past Medical History:  Diagnosis Date  . Anemia    PT HAS HAD COLONOSCOPY AND ENDOSCOPY WORK UP - NO PROBLEMS FOUND AS SOURCE OF ANEMIA -- PT HAD IRON INFUSION AT [facility] 11/13/12-AFTER SEEING HEMATOLOGIST DR. [place] AND HE GAVE HEMATOLOGIC CLEARANCE FOR GASTRIC BYPASS SURGERY.  Marland Kitchen Anxiety   . Asthma    daily and prn inhalers  . Degenerative joint disease    right knee, spine - STATES INTERMITTENT  NUMBNESS DOWN RT LEG WITH PROLONGED STANDING OR WALKING- THINKS RELATED TO HER SPINE PROBLEMS  . Dental crowns present   . Depression   . Family history of anesthesia complication    pt's mother and sister have hx. of post-op N/V  . Fibroids    UTERINE  . Gastroesophageal reflux disease    none for 2 years since Bariatric surgery.  . H/O hiatal hernia   . History of endometriosis   . History of kidney stones   . Hyperlipidemia   . Hypertension    under control with med., has been on med. x 2 yr.  . Insulin dependent diabetes mellitus   . Lock jaw    jaw locks open if opens mouth wide  . Migraines   . Neuropathy    FEET  . Obesity   . Palpitations   . PONV (postoperative nausea and vomiting)   . Shortness of breath    with daily activities  . Sleep  apnea    post-op didn't need CPAP but now states needs it again but not using    PSH: Past Surgical History:  Procedure Laterality Date  . ABDOMINAL HYSTERECTOMY N/A 06/06/2017   Procedure: HYSTERECTOMY ABDOMINAL;  Surgeon: Florian Buff, MD;  Location: [facility];  Service: Gynecology;  Laterality: N/A;  . BACK SURGERY    . BREATH TEK H PYLORI N/A 08/13/2012   Procedure: BREATH TEK H PYLORI;  Surgeon: Edward Jolly, MD;  Location: [facility];  Service: General;  Laterality: N/A;  . CARPAL TUNNEL RELEASE  01/03/2012   Procedure: CARPAL TUNNEL RELEASE;  Surgeon: Wynonia Sours, MD;  Location: [facility];  Service: Orthopedics;  Laterality: Right;  . carpal tunnel  release right hand Right 12/13  . COLONOSCOPY  05/2009   [id number] hemorrhoids otherwise normal colon, rectum and terminal ileum  . COLONOSCOPY WITH ESOPHAGOGASTRODUODENOSCOPY (EGD) N/A 10/28/2012   Procedure: COLONOSCOPY WITH ESOPHAGOGASTRODUODENOSCOPY (EGD);  Surgeon: Daneil Dolin, MD;  [facility];  Service: Endoscopy;  Laterality: N/A;  7:30  . DILITATION & CURRETTAGE/HYSTROSCOPY WITH NOVASURE ABLATION N/A 07/22/2014   Procedure: DILATATION & CURETTAGE/HYSTEROSCOPY WITH NOVASURE ABLATION; uterine length 6.0 cm; uterine width 3.8 cm; total ablation time 1 minute 15 seconds;  Surgeon: Florian Buff, MD;  Location: [facility];  Service: Gynecology;  Laterality: N/A;  . ESOPHAGOGASTRODUODENOSCOPY  5/013/2011   [id number] esophagus/multiple polyps removed s/p (hyperplastic). Gastritis without H.pylori.  . ESOPHAGOGASTRODUODENOSCOPY (EGD) WITH PROPOFOL N/A 06/06/2018   with LA Grade A esophagitis s/p dilation.   Marland Kitchen GASTRIC ROUX-EN-Y N/A 11/19/2012   Procedure: LAPAROSCOPIC ROUX-EN-Y GASTRIC;  Surgeon: Edward Jolly, MD;  Location: [facility];  Service: General;  Laterality: N/A;  . GIVENS CAPSULE STUDY N/A 10/28/2012   Procedure: GIVENS CAPSULE STUDY;  Surgeon: Daneil Dolin, MD;  [facility];  Service: Endoscopy;  Laterality: N/A;  . KNEE ARTHROSCOPY WITH MEDIAL MENISECTOMY Right 10/28/2015   Procedure: RIGHT KNEE ARTHROSCOPY WITH MEDIAL MENISECTOMY;  Surgeon: Carole Civil, MD;  Location: [facility];  Service: Orthopedics;  Laterality: Right;  . LAPAROSCOPY     due to endometriosis  . MALONEY DILATION N/A 06/06/2018   Procedure: Venia Minks DILATION;  Surgeon: Daneil Dolin, MD;  [facility];  Service: Endoscopy;  Laterality: N/A;  . PELVIC LAPAROSCOPY  11/04/1999   with fulguration of endometriosis  . SALPINGOOPHORECTOMY Bilateral 06/06/2017   Procedure: SALPINGO OOPHORECTOMY;  Surgeon: Florian Buff, MD;  Location: [facility];  Service: Gynecology;  Laterality:  Bilateral;  . TOTAL KNEE ARTHROPLASTY Right 07/22/2019   Procedure: TOTAL KNEE ARTHROPLASTY;  Surgeon: Carole Civil, MD;  Location: [facility];  Service: Orthopedics;  Laterality: Right;  . TRIGGER FINGER RELEASE Right 09/24/2012   Procedure: RELEASE A-1 PULLEY OF RIGHT THUMB;  Surgeon: Wynonia Sours, MD;  Location: [facility];  Service: Orthopedics;  Laterality: Right;  . TUBAL LIGATION  1993    (Not in a hospital admission)   SH: Social History   Tobacco Use  . Smoking status: Never Smoker  . Smokeless tobacco: Never Used  Vaping Use  . Vaping Use: Never used  Substance Use Topics  . Alcohol use: No  . Drug use: No    MEDS: Prior to Admission medications   Medication Sig Start Date End Date Taking? Authorizing Provider  acetaminophen (TYLENOL) 500 MG tablet Take 1 tablet (500 mg total) by mouth every 6 (six) hours as needed. 09/11/19   Avegno,  Darrelyn Hillock, FNP  albuterol (PROVENTIL HFA;VENTOLIN HFA) 108 (90 Base) MCG/ACT inhaler Inhale 2 puffs into the lungs every 6 (six) hours as needed for wheezing. 03/20/17   Kathyrn Drown, MD  amLODipine (NORVASC) 2.5 MG tablet TAKE 1 TABLET BY MOUTH DAILY 11/28/19   Kathyrn Drown, MD  ARIPiprazole (ABILIFY) 5 MG tablet  09/03/19   [provider]  Calcium Citrate 200 MG TABS Take 400 mg by mouth daily.    [provider]  cetirizine (ZYRTEC) 10 MG tablet Take 10 mg by mouth daily.    [provider]  cloNIDine (CATAPRES) 0.1 MG tablet Take by mouth. 01/04/20 01/03/21  [provider]  diazepam (VALIUM) 5 MG tablet Take 1 tablet (5 mg total) by mouth every 6 (six) hours as needed. 10/09/19   Carole Civil, MD  ergocalciferol (VITAMIN D2) 1.25 MG (50000 UT) capsule Take 1 capsule (50,000 Units total) by mouth once a week. Patient taking differently: Take 50,000 Units by mouth every Wednesday. 06/18/19   Lockamy, Theresia Lo, NP-C  estradiol (ESTRACE) 2 MG tablet Take 1 tablet (2 mg total) by  mouth at bedtime. Patient taking differently: Take 2 mg by mouth every evening. 07/10/19   Florian Buff, MD  fluticasone (FLOVENT HFA) 220 MCG/ACT inhaler Inhale 2 puffs into the lungs 2 (two) times daily. Patient taking differently: Inhale 2 puffs into the lungs daily as needed (Shortness of breath). 03/20/17   Kathyrn Drown, MD  gabapentin (NEURONTIN) 400 MG capsule Take 1 capsule (400 mg total) by mouth 4 (four) times daily. 01/07/20 02/06/20  Carole Civil, MD  HYDROcodone-acetaminophen [facility]) 10-325 MG tablet Take by mouth. 01/04/20   [provider]  indomethacin (INDOCIN) 25 MG capsule TAKE 1 CAPSULE(25 MG) BY MOUTH THREE TIMES DAILY WITH MEALS Patient not taking: Reported on 01/26/2020 11/06/19   Carole Civil, MD  insulin glargine (LANTUS SOLOSTAR) 100 UNIT/ML Solostar Pen Use 34 units qhs. May titrate to 40 units qhs 10/06/19   Luking, Elayne Snare, MD  Insulin Pen Needle (PEN NEEDLES) 32G X 4 MM MISC 32 g by Does not apply route daily. 01/30/19   Kathyrn Drown, MD  lamoTRIgine (LAMICTAL) 100 MG tablet Take 100 mg by mouth 2 (two) times daily.  04/30/19   [provider]  losartan (COZAAR) 100 MG tablet TAKE 1 TABLET BY MOUTH DAILY 01/12/20   Kathyrn Drown, MD  Melatonin 3 MG SUBL Place 3 mg under the tongue at bedtime.     [provider]  metFORMIN (GLUCOPHAGE) 500 MG tablet TAKE 1 TABLET(500 MG) BY MOUTH TWICE DAILY WITH A MEAL 01/28/20   Luking, Elayne Snare, MD  methocarbamol (ROBAXIN) 750 MG tablet Take 1 tablet (750 mg total) by mouth 4 (four) times daily. 01/07/20   Carole Civil, MD  Multiple Vitamin (MULTIVITAMIN WITH MINERALS) TABS tablet Take 1 tablet by mouth daily.     [provider]  rosuvastatin (CRESTOR) 5 MG tablet TAKE 1 TABLET BY MOUTH EVERY DAY 08/28/19   Kathyrn Drown, MD  venlafaxine (EFFEXOR) 75 MG tablet Take 75 mg by mouth 2 (two) times daily with a meal.  12/07/13   [provider]    ALLERGY: Allergies   Allergen Reactions  . Qvar [Beclomethasone] Shortness Of Breath    Patient states that this medication causes her to feel short of breath  . Sulfa Antibiotics Rash and Hives  . Tizanidine Shortness Of Breath    Rash,  sob, and itching  . Prozac [Fluoxetine Hcl] Hives  . Ace Inhibitors Cough  . Erythromycin Ethylsuccinate Hives and Nausea Only  . Fluoxetine Hcl Other (See Comments)    unknown  . Hydrochlorothiazide Cough  . Lipitor [Atorvastatin Calcium] Other (See Comments)    Body aches, flu-like symptoms  . Dyazide [Hydrochlorothiazide W-Triamterene] Cough  . Erythromycin Nausea Only  . Lisinopril Cough  . Pravastatin Other (See Comments)    BODY ACHES, FLU-LIKE SX.  . Sulfonamide Derivatives Hives and Rash    Social History   Tobacco Use  . Smoking status: Never Smoker  . Smokeless tobacco: Never Used  Substance Use Topics  . Alcohol use: No     Family History  Problem Relation Age of Onset  . Hypertension Father   . Hyperlipidemia Father   . COPD Father   . COPD Mother   . Heart disease Mother   . Cancer Mother        Cervix  . Anesthesia problems Mother        post-op N/V  . Thyroid disease Mother   . Diabetes Maternal Grandfather   . Thyroid disease Sister   . Anesthesia problems Sister        post-op N/V  . Thyroid disease Paternal Uncle   . Diabetes Other   . Colon cancer Neg Hx   . Colon polyps Neg Hx      ROS   Review of Systems  All other systems reviewed and are negative.   Exam   Vitals:   02/14/20 0849 02/14/20 1000  BP:  (!) 187/93  Pulse:  (!) 111  Resp:  16  Temp:  99.2 F (37.3 C)  SpO2: 97% 97%   General appearance: WDWN, NAD Eyes: No scleral injection Cardiovascular: Regular rate and rhythm without murmurs, rubs, gallops. No edema or variciosities. Distal pulses normal. Pulmonary: Effort normal, non-labored breathing Musculoskeletal:     Muscle tone upper extremities: Normal    Muscle tone lower extremities: Normal     Motor exam: Upper Extremities Deltoid Bicep Tricep Grip  Right 5/5 5/5 5/5 5/5  Left 5/5 5/5 5/5 5/5   Lower Extremity IP Quad PF DF EHL  Right 5/5 5/5 5/5 4/5 4/5  Left 5/5 5/5 5/5 2/5 4/5   Neurological Mental Status:    - Patient is awake, alert, oriented to person, place, month, year, and situation    - Patient is able to give a clear and coherent history.    - No signs of aphasia or neglect Cranial Nerves    - II: Visual Fields are full. PERRL    - III/IV/VI: EOMI without ptosis or diploplia.     - V: Facial sensation is grossly normal    - VII: Facial movement is symmetric.     - VIII: hearing is intact to voice    - X: Uvula elevates symmetrically    - XI: Shoulder shrug is symmetric.    - XII: tongue is midline without atrophy or fasciculations.  Sensory: Sensation grossly intact to LT Incision: c/d/i   Results - Imaging/Labs   Results for orders placed or performed during the hospital encounter of 02/13/20 (from the past 48 hour(s))  CBG monitoring, ED     Status: Abnormal   Collection Time: 02/13/20  8:47 PM  Result Value Ref Range   Glucose-Capillary 136 (H) 70 - 99 mg/dL    Comment: Glucose reference range applies only to samples taken after fasting for at least 8  hours.  SARS Coronavirus 2 by RT PCR (hospital order, performed in [facility] hospital lab) Nasopharyngeal Urine, Clean Catch     Status: Abnormal   Collection Time: 02/13/20 10:51 PM   Specimen: Urine, Clean Catch; Nasopharyngeal  Result Value Ref Range   SARS Coronavirus 2 POSITIVE (A) NEGATIVE    Comment: CRITICAL RESULT CALLED TO, READ BACK BY AND VERIFIED WITH: SAPPELT,J @ 0334 ON 02/14/20 BY JUW (NOTE) SARS-CoV-2 target nucleic acids are DETECTED  SARS-CoV-2 RNA is generally detectable in upper respiratory specimens  during the acute phase of infection.  Positive results are indicative  of the presence of the identified virus, but do not rule out bacterial infection or co-infection with  other pathogens not detected by the test.  Clinical correlation with patient history and  other diagnostic information is necessary to determine patient infection status.  The expected result is negative.  Fact Sheet for Patients:   StrictlyIdeas.no   Fact Sheet for Healthcare Providers:   BankingDealers.co.za    This test is not yet approved or cleared by the [place] FDA and  has been authorized for detection and/or diagnosis of SARS-CoV-2 by FDA under an Emergency Use Authorization (EUA).  This EUA will remain in effect (meanin g this test can be used) for the duration of  the COVID-19 declaration under Section 564(b)(1) of the Act, 21 U.S.C. section 360-bbb-3(b)(1), unless the authorization is terminated or revoked sooner.  Performed at [facility], [address], [place], [place] [address]   Urinalysis, Routine w reflex microscopic Urine, Clean Catch     Status: Abnormal   Collection Time: 02/13/20 10:52 PM  Result Value Ref Range   Color, Urine YELLOW YELLOW   APPearance CLEAR CLEAR   Specific Gravity, Urine 1.016 1.005 - 1.030   pH 5.0 5.0 - 8.0   Glucose, UA NEGATIVE NEGATIVE mg/dL   Hgb urine dipstick SMALL (A) NEGATIVE   Bilirubin Urine NEGATIVE NEGATIVE   Ketones, ur 20 (A) NEGATIVE mg/dL   Protein, ur NEGATIVE NEGATIVE mg/dL   Nitrite NEGATIVE NEGATIVE   Leukocytes,Ua TRACE (A) NEGATIVE   RBC / HPF 0-5 0 - 5 RBC/hpf   WBC, UA 11-20 0 - 5 WBC/hpf   Bacteria, UA RARE (A) NONE SEEN   Squamous Epithelial / LPF 0-5 0 - 5    Comment: Performed at [facility], [address], [place], [place] [address]  CBC     Status: Abnormal   Collection Time: 02/13/20 11:13 PM  Result Value Ref Range   WBC 10.2 4.0 - 10.5 K/uL   RBC 4.52 3.87 - 5.11 MIL/uL   Hemoglobin 14.3 12.0 - 15.0 g/dL   HCT 42.0 36.0 - 46.0 %   MCV 92.9 80.0 - 100.0 fL   MCH 31.6 26.0 - 34.0 pg   MCHC 34.0 30.0 - 36.0 g/dL   RDW 11.3 (L) 11.5 - 15.5 %    Platelets 270 150 - 400 K/uL   nRBC 0.0 0.0 - 0.2 %    Comment: Performed at [facility], [address], [place], [place] [id number]  Basic metabolic panel     Status: Abnormal   Collection Time: 02/13/20 11:13 PM  Result Value Ref Range   Sodium 138 135 - 145 mmol/L   Potassium 3.5 3.5 - 5.1 mmol/L   Chloride 104 98 - 111 mmol/L   CO2 25 22 - 32 mmol/L   Glucose, Bld 139 (H) 70 - 99 mg/dL    Comment: Glucose reference range  applies only to samples taken after fasting for at least 8 hours.   BUN 11 6 - 20 mg/dL   Creatinine, Ser 0.58 0.44 - 1.00 mg/dL   Calcium 8.7 (L) 8.9 - 10.3 mg/dL   GFR, Estimated >60 >60 mL/min    Comment: (NOTE) Calculated using the CKD-EPI Creatinine Equation (2021)    Anion gap 9 5 - 15    Comment: Performed at [facility], [address], [place], [place] [address]    MR Lumbar Spine W Wo Contrast  Result Date: 02/14/2020 CLINICAL DATA:  Lumbar radiculopathy, prior surgery, new symptoms left drop foot, s/p R L3-4 L4-5 microdisc EXAM: MRI LUMBAR SPINE WITHOUT AND WITH CONTRAST TECHNIQUE: Multiplanar and multiecho pulse sequences of the lumbar spine were obtained without and with intravenous contrast. CONTRAST:  67mL GADAVIST GADOBUTROL 1 MMOL/ML IV SOLN COMPARISON:  01/20/2020 MRI lumbar spine. FINDINGS: Segmentation:  Standard. Alignment:  Minimal grade 1 L3-4 anterolisthesis, unchanged. Vertebrae: Mild Modic type 2 endplate degenerative changes. Vertebral body heights are preserved. Scattered hemangiomata versus focal fat. Conus medullaris and cauda equina: Conus extends to the L2 level. Conus and cauda equina appear normal. Disc levels: Multilevel desiccation. T12-L1: Minimal disc bulge and facet degenerative spurring. Patent spinal canal and neural foramen. L1-2: Mild disc bulge and bilateral facet hypertrophy. Patent spinal canal. Mild bilateral neural foraminal narrowing. L2-3: Mild disc bulge and bilateral facet hypertrophy. Patent spinal canal and neural  foramen. L3-4: Sequela of right laminotomy and partial discectomy. Prominence of the epidural fat. Decreased conspicuity of prior central protrusion. Mild disc bulge and bilateral facet hypertrophy. Edema involving the right paracentral disc may reflect postprocedural sequela. Severe spinal canal narrowing, more conspicuous than prior exam. Mild bilateral neural foraminal narrowing, unchanged. L4-5: Sequela of right laminotomy and partial discectomy. Decreased conspicuity of right paracentral protrusion with minimal edema involving the right paracentral disc. Prominence of the epidural fat. Mild disc bulge and bilateral facet degenerative spurring, otherwise unchanged. Severe spinal canal narrowing, more conspicuous than prior exam. Mild bilateral neural foraminal narrowing. L5-S1: Mild disc bulge and central protrusion/annular fissuring. Bilateral facet hypertrophy. Patent spinal canal and neural foramen. Paraspinal and other soft tissues: Postsurgical appearance of the paraspinal soft tissues. IMPRESSION: Sequela of interval right laminotomy and partial discectomy at the L3-4, L4-5 levels. Severe spinal canal narrowing at the L3-5 levels, more conspicuous than prior exam. Increased conspicuity of the epidural fat, contributing to stenosis at these levels may reflect postprocedural edema. Multilevel spondylosis, otherwise unchanged. Electronically Signed   By: Primitivo Gauze M.D.   On: 02/14/2020 12:01    Impression/Plan   60 y.o. female s/p L3-4 L4-5 R MIS discectomy for right lumbar radiculopathy and right sided foot drop this past Wesnes[place] 1/19 by Dr Zada Finders at the outpatient surgical center.  She developed severe symptoms on the contralateral side post op along with left foot drop. Pre op RLE symptoms essentially resolved.  MRI L spine reviewed shows slight worsening spinal stenosis at L3-4 and L4-5 without significant disc herniation. Given acuity of presentation, it was recommended she undergo  L3-4 L4-5 laminectomy. We have reviewed the indications for surgery, the associated risks, benefits and alternatives at length.  All questions today were answered and consent was obtained. Dr Kathyrn Sheriff to follow up.  Also noted COVID-19 positive. Asx. Will monitor.  Ferne Reus, PA-C [facility] [employer]

## 2020-02-14 NOTE — Progress Notes (Signed)
Brock MD notified of blood pressure of 416'S to 063'K systolic and diastolic was low 160'F. 5mg  of Labetalol was ordered and also pain medication was administered per order. Her BP came down to 165/90. MD cleared her to go up to the unit. Patient is in stable condition. Will continue to monitor.

## 2020-02-15 ENCOUNTER — Encounter (HOSPITAL_COMMUNITY): Payer: Self-pay | Admitting: Neurosurgery

## 2020-02-15 LAB — BASIC METABOLIC PANEL
Anion gap: 11 (ref 5–15)
BUN: 7 mg/dL (ref 6–20)
CO2: 23 mmol/L (ref 22–32)
Calcium: 8.7 mg/dL — ABNORMAL LOW (ref 8.9–10.3)
Chloride: 103 mmol/L (ref 98–111)
Creatinine, Ser: 0.72 mg/dL (ref 0.44–1.00)
GFR, Estimated: >60 mL/min (ref >60)
Glucose, Bld: 172 mg/dL — ABNORMAL HIGH (ref 70–99)
Potassium: 3.7 mmol/L (ref 3.5–5.1)
Sodium: 137 mmol/L (ref 135–145)

## 2020-02-15 LAB — GLUCOSE, CAPILLARY
Glucose-Capillary: 206 mg/dL — ABNORMAL HIGH (ref 70–99)
Glucose-Capillary: 192 mg/dL — ABNORMAL HIGH (ref 70–99)
Glucose-Capillary: 232 mg/dL — ABNORMAL HIGH (ref 70–99)
Glucose-Capillary: 153 mg/dL — ABNORMAL HIGH (ref 70–99)

## 2020-02-15 LAB — CBC
HCT: 41.9 % (ref 36.0–46.0)
Hemoglobin: 14.1 g/dL (ref 12.0–15.0)
MCH: 30.9 pg (ref 26.0–34.0)
MCHC: 33.7 g/dL (ref 30.0–36.0)
MCV: 91.7 fL (ref 80.0–100.0)
Platelets: 322 10*3/uL (ref 150–400)
RBC: 4.57 MIL/uL (ref 3.87–5.11)
RDW: 11.1 % — ABNORMAL LOW (ref 11.5–15.5)
WBC: 13.2 10*3/uL — ABNORMAL HIGH (ref 4.0–10.5)
nRBC: 0 % (ref 0.0–0.2)

## 2020-02-15 NOTE — Progress Notes (Signed)
  NEUROSURGERY PROGRESS NOTE   No issues overnight.  Complains of severe muscle spasms Pre op LLE resolved Numbness in left foot improving as well as strength  EXAM:  BP (!) 155/81 (BP Location: Left Wrist)   Pulse (!) 107   Temp 99.5 F (37.5 C) (Axillary)   Resp 12   Ht 5\' 3"  (1.6 m)   Wt 90.3 kg   LMP 07/10/2013   SpO2 99%   BMI 35.26 kg/m   Awake, alert, oriented  Speech fluent, appropriate  CN grossly intact  5/5 BUE 4+/5 DF RLE otherwise normal 3/5 DF LLE otherwise normal  IMPRESSION/PLAN 60 y.o. female POD1 L3-4 L4-5 laminectomy for spinal stenosis and new L foot drop. Muscle spasms appear to be her biggest concern. Otherwise, preop LLE symptoms improving.  - PT/OT today

## 2020-02-15 NOTE — Progress Notes (Signed)
OT Cancellation Note  Patient Details Name: Carrie Cook MRN: [id number] DOB: 1960/05/18   Cancelled Treatment:    Reason Eval/Treat Not Completed: Pain limiting ability to participate.    Nilsa Nutting., OTR/L Acute Rehabilitation Services Pager [phone] Office [phone]   Lucille Passy M 02/15/2020, 1:05 PM

## 2020-02-15 NOTE — Progress Notes (Signed)
Inpatient Rehab Admissions Coordinator Note:   Per PT recommendation, pt was screened for CIR candidacy by Gayland Curry, MS, CCC-SLP.  At this time we are not recommending an inpatient rehab consult. Noted COVID + 02/14/20.  Patients are eligible to be considered for admit to CIR when cleared from airborne precautions by acute MD or Infectious Disease regardless of onset date.  Otherwise, they will need to be >20 days from their positive test with recovery/improvement in symptoms or 2 negative tests.  Please contact me with questions.    Gayland Curry, MS, [place] Admissions Coordinator [phone] 02/15/20 11:52 AM

## 2020-02-15 NOTE — Evaluation (Addendum)
Physical Therapy Evaluation Patient Details Name: Carrie Cook MRN: [id number] DOB: Aug 28, 1960 Today's Date: 02/15/2020   History of Present Illness  60 y.o. female s/p right L3-4, L4-5 decompression 02/11/20 with postop improvement in right foot drop and radiculopathy but new severe contralateral radiculopathy with foot drop.02/14/20 Bilateral L3-4, L4-5 laminotomy, medial facetectomy for decompression of thecal sac and nerve roots including microdiscectomy at L4-5  Clinical Impression   Patient is s/p above surgery resulting in functional limitations due to the deficits listed below (see PT Problem List). Patient has been limited in her mobility since pain began in August 2021, but most severe for past 30 days. She had one fall at home after 02/11/20 surgery--reported spasms hit, her left leg went out, and she fell.  She is currently limited by severe pain and muscle spasms. She is undoubtedly deconditioned after a month of limited movement (lying down except for walking to bathroom). She has 3 steps to enter her home and will need additional rehab prior to return home.  Patient will benefit from skilled PT to increase their independence and safety with mobility to allow discharge to the venue listed below.       Follow Up Recommendations CIR (pt will have to be off airborne/Covid precautions; otherwise SNF)    Equipment Recommendations  Wheelchair (measurements PT);Wheelchair cushion (measurements PT)    Recommendations for Other Services OT consult;Rehab consult     Precautions / Restrictions Precautions Precautions: Back;Fall Precaution Booklet Issued: No Precaution Comments: pt with 1 fall at home after 1st surgery on 02/11/20 Required Braces or Orthoses:  (NO brace)      Mobility  Bed Mobility Overal bed mobility: Needs Assistance Bed Mobility: Rolling Rolling: Mod assist         General bed mobility comments: assist to bend legs, assist to roll    Transfers                  General transfer comment: unable due to severe pain despite pre-medication  Ambulation/Gait             General Gait Details: unable due to severe pain despite pre-medication  Stairs            Wheelchair Mobility    Modified Rankin (Stroke Patients Only)       Balance                                             Pertinent Vitals/Pain Pain Assessment: 0-10 Pain Score: 10-Worst pain ever Pain Location: buttocks and legs Pain Descriptors / Indicators: Cramping Pain Intervention(s): Limited activity within patient's tolerance;Monitored during session;Premedicated before session;Repositioned;Ice applied;Patient requesting pain meds-RN notified    Home Living Family/patient expects to be discharged to:: Private residence Living Arrangements: Spouse/significant other Available Help at Discharge: Family;Available PRN/intermittently (husband works; no one else while she has COVIDE) Type of Home: House Home Access: Stairs to enter Entrance Stairs-Rails: None Entrance Stairs-Number of Steps: 3 Home Layout: One level Home Equipment: Environmental consultant - 2 wheels;Walker - 4 wheels;Cane - single point;Grab bars - tub/shower;Bedside commode;Toilet riser (riser with handles)      Prior Function Level of Independence: Needs assistance   Gait / Transfers Assistance Needed: using RW to get to bathroom; lots of near falls; one fall since surgery when left leg went out;  ADL's / Homemaking Assistance Needed: assist from family and friends  Comments: about a month since tolerated sitting--mostly has been flat on her back; pain began in August and initially was using a cane to walk, having "flare ups" of pain and spasms, going to urgent cares until she had surgery     Hand Dominance   Dominant Hand: Right    Extremity/Trunk Assessment   Upper Extremity Assessment Upper Extremity Assessment: Overall WFL for tasks assessed    Lower Extremity  Assessment Lower Extremity Assessment: RLE deficits/detail;LLE deficits/detail RLE Deficits / Details: AAROM WFL; limited due to spasms/cramping LLE Deficits / Details: AAROM WFL; limited due to spasms/cramping       Communication   Communication: No difficulties  Cognition Arousal/Alertness: Awake/alert Behavior During Therapy: Anxious (crying) Overall Cognitive Status: Within Functional Limits for tasks assessed                                        General Comments General comments (skin integrity, edema, etc.): Repositioning with knees flexed and supported vs sidelying vs sidelying with pillow between knees with either only momentary success or no success. Patient reports at home that ice helped her spasms and pain and provided 4 icepacks to lateral back and lateral buttocks/thighs while pt in supine (could not tolerate  sidelying    Exercises Other Exercises Other Exercises: instructed in gentle AROM to decrease pain/spasms: pt performed ankle pumps, toe wiggling, gentle knee flexion; attempted hip IR/ER but this increased her discomfort   Assessment/Plan    PT Assessment Patient needs continued PT services  PT Problem List Decreased range of motion;Decreased activity tolerance;Decreased balance;Decreased mobility;Decreased knowledge of use of DME;Decreased knowledge of precautions;Obesity;Pain       PT Treatment Interventions DME instruction;Gait training;Stair training;Functional mobility training;Therapeutic activities;Therapeutic exercise;Patient/family education    PT Goals (Current goals can be found in the Care Plan section)  Acute Rehab PT Goals Patient Stated Goal: stop having spasms/pain PT Goal Formulation: With patient Time For Goal Achievement: 02/29/20 Potential to Achieve Goals: Good    Frequency Min 5X/week   Barriers to discharge Decreased caregiver support husband only person employed and has to work; pt now has COVID and doesn't want  sister to help    Co-evaluation               AM-PAC PT "6 Clicks" Mobility  Outcome Measure Help needed turning from your back to your side while in a flat bed without using bedrails?: A Lot Help needed moving from lying on your back to sitting on the side of a flat bed without using bedrails?: Total Help needed moving to and from a bed to a chair (including a wheelchair)?: Total Help needed standing up from a chair using your arms (e.g., wheelchair or bedside chair)?: Total Help needed to walk in hospital room?: Total Help needed climbing 3-5 steps with a railing? : Total 6 Click Score: 7    End of Session   Activity Tolerance: Patient limited by pain Patient left: in bed;with call bell/phone within reach;with nursing/sitter in room;with SCD's reapplied Nurse Communication: Mobility status;Other (comment) (not tolerated sitting x 1 month) PT Visit Diagnosis: History of falling (Z91.81);Difficulty in walking, not elsewhere classified (R26.2);Pain Pain - Right/Left:  (bilateral) Pain - part of body: Hip;Leg (back)    Time: 4098-1191 PT Time Calculation (min) (ACUTE ONLY): 57 min   Charges:   PT Evaluation $PT Eval Low Complexity: 1 Low PT Treatments $Therapeutic Exercise:  8-22 mins $Therapeutic Activity: 8-22 mins         Arby Barrette, PT Pager [phone]   Rexanne Mano 02/15/2020, 10:25 AM

## 2020-02-15 NOTE — Progress Notes (Signed)
   02/15/20 0400  Assess: MEWS Score  Temp 98.2 F (36.8 C)  BP (!) 168/96  Pulse Rate (!) 117  ECG Heart Rate (!) 118  Resp 14  SpO2 99 %  O2 Device Nasal Cannula  O2 Flow Rate (L/min) 2 L/min  Assess: MEWS Score  MEWS Temp 0  MEWS Systolic 0  MEWS Pulse 2  MEWS RR 0  MEWS LOC 0  MEWS Score 2  MEWS Score Color Yellow  Assess: if the MEWS score is Yellow or Red  Were vital signs taken at a resting state? Yes  Focused Assessment No change from prior assessment  Early Detection of Sepsis Score *See Row Information* Low  MEWS guidelines implemented *See Row Information* Yes  Treat  Pain Scale 0-10  Pain Score 10  Pain Type Acute pain;Surgical pain  Pain Location Back  Pain Orientation Mid;Lower  Pain Descriptors / Indicators Aching;Discomfort  Pain Frequency Constant  Pain Onset On-going  Pain Intervention(s) Medication (See eMAR)  Take Vital Signs  Increase Vital Sign Frequency  Yellow: Q 2hr X 2 then Q 4hr X 2, if remains yellow, continue Q 4hrs  Escalate  MEWS: Escalate Yellow: discuss with charge nurse/RN and consider discussing with provider and RRT  Notify: Charge Nurse/RN  Name of Charge Nurse/RN Notified Cheron Schaumann, RN  Date Charge Nurse/RN Notified 02/15/20  Time Charge Nurse/RN Notified 0411  Document  Patient Outcome Other (Comment) (no intervention required)

## 2020-02-16 LAB — GLUCOSE, CAPILLARY
Glucose-Capillary: 155 mg/dL — ABNORMAL HIGH (ref 70–99)
Glucose-Capillary: 163 mg/dL — ABNORMAL HIGH (ref 70–99)
Glucose-Capillary: 168 mg/dL — ABNORMAL HIGH (ref 70–99)
Glucose-Capillary: 157 mg/dL — ABNORMAL HIGH (ref 70–99)
Glucose-Capillary: 189 mg/dL — ABNORMAL HIGH (ref 70–99)
Glucose-Capillary: 154 mg/dL — ABNORMAL HIGH (ref 70–99)
Glucose-Capillary: 126 mg/dL — ABNORMAL HIGH (ref 70–99)

## 2020-02-16 MED ORDER — CYCLOBENZAPRINE HCL 10 MG PO TABS
10.0000 mg | ORAL_TABLET | Freq: Three times a day (TID) | ORAL | Status: DC | PRN
Start: 2020-02-16 — End: 2020-02-18
  Administered 2020-02-17 – 2020-02-18 (×2): 10 mg via ORAL
  Filled 2020-02-16 (×2): qty 1

## 2020-02-16 MED ORDER — HEPARIN SODIUM (PORCINE) 5000 UNIT/ML IJ SOLN
5000.0000 [IU] | Freq: Three times a day (TID) | INTRAMUSCULAR | Status: DC
  Administered 2020-02-16 – 2020-02-24 (×26): 5000 [IU] via SUBCUTANEOUS
  Filled 2020-02-16 (×25): qty 1

## 2020-02-16 NOTE — Progress Notes (Signed)
Physical Therapy Treatment Patient Details Name: Carrie Cook MRN: [id number] DOB: 12/06/1960 Today's Date: 02/16/2020    History of Present Illness 60 y.o. female s/p right L3-4, L4-5 decompression 02/11/20 with postop improvement in right foot drop and radiculopathy but new severe contralateral radiculopathy with foot drop.02/14/20 Bilateral L3-4, L4-5 laminotomy, medial facetectomy for decompression of thecal sac and nerve roots including microdiscectomy at L4-5    PT Comments    Patient remains anxious regarding mobility due to incr pain and spasms. Pain increased from 7/10 to 9/10 from supine to sit. Quickly transitioned to standing at EOB as sitting is her most painful position. Pt stood with min assist of 2 for up to 2 minutes with pre-gait activities. Patient very anxious re: possible fall after having fallen at home. This is of concern as pt will be home alone when husband working and may need to change focus to wheelchair level for main type of locomotion. Will continue to assess.     Follow Up Recommendations  CIR (pt will have to be off airborne/Covid precautions; otherwise SNF)     Equipment Recommendations  Wheelchair (measurements PT);Wheelchair cushion (measurements PT)    Recommendations for Other Services OT consult;Rehab consult     Precautions / Restrictions Precautions Precautions: Back;Fall Precaution Booklet Issued: No Precaution Comments: pt with 1 fall at home after 1st surgery on 02/11/20 Required Braces or Orthoses:  (NO brace)    Mobility  Bed Mobility Overal bed mobility: Needs Assistance Bed Mobility: Rolling;Supine to Sit;Sit to Supine Rolling: Min assist (heavy use of bed rail)   Supine to sit: Mod assist Sit to supine: Mod assist   General bed mobility comments: pt demonstrates log rolling min guard (A) initially but several times during session needed physical (A) at min level with pad used. Pt progressed L side sitting with bed elevated to  help with positioning of patient. pt reports pain immediately increase with sitting. pt reports increased tolerance with medication one hour prior to session. pt progressed to standing with RW with cues to space bil LE shoulder width apart. pt with return to bed anxious and holding onto RW. pt requires cues for return to bed and (A) to lift bil LE onto bed surface and facilitation at shoulder to remain on side without trunk rotation. pt total +2 max (A) to scoot toward [facility]  Transfers Overall transfer level: Needs assistance Equipment used: Rolling walker (2 wheeled) Transfers: Sit to/from Stand Sit to Stand: +2 physical assistance;Mod assist;From elevated surface         General transfer comment: pt requires (A) to elevate from bed surface with strong reliance on BIL UE. pt able to side step toward [facility] with increased time. pt reports legs are weak due to prolonged bed positioning  Ambulation/Gait             General Gait Details: pre-gait--wt-shifting progressing to low marching with heavy reliance on UEs via RW; pt fearful of spasms causing collapse/fall   Stairs             Wheelchair Mobility    Modified Rankin (Stroke Patients Only)       Balance Overall balance assessment: Needs assistance Sitting-balance support: Bilateral upper extremity supported;Feet supported Sitting balance-Leahy Scale: Fair     Standing balance support: Bilateral upper extremity supported;During functional activity Standing balance-Leahy Scale: Poor Standing balance comment: reliant on RW  Cognition Arousal/Alertness: Awake/alert Behavior During Therapy: Anxious;WFL for tasks assessed/performed Overall Cognitive Status: Within Functional Limits for tasks assessed                                 General Comments: pt reports "i actually feel better now that i did that". pt becomes anxious with sitting task as she is anticipating the  increase in pain with the sitting position      Exercises Other Exercises Other Exercises: encouraged supine hip / knee flexion x5 reps prior to any mobility    General Comments General comments (skin integrity, edema, etc.): dressing on wound appears loose due to patient sweating.      Pertinent Vitals/Pain Pain Assessment: 0-10 Pain Score: 9  Pain Location: buttocks and legs Pain Descriptors / Indicators: Cramping Pain Intervention(s): Limited activity within patient's tolerance;Monitored during session;Premedicated before session;Utilized relaxation techniques    Home Living Family/patient expects to be discharged to:: Private residence Living Arrangements: Spouse/significant other Available Help at Discharge: Family;Available PRN/intermittently Type of Home: House Home Access: Stairs to enter Entrance Stairs-Rails: None Home Layout: One level Home Equipment: Environmental consultant - 2 wheels;Walker - 4 wheels;Cane - single point;Grab bars - tub/shower;Bedside commode;Toilet riser Additional Comments: pt reports she will be home alone during the day    Prior Function Level of Independence: Needs assistance  Gait / Transfers Assistance Needed: using RW to get to bathroom; lots of near falls; one fall since surgery when left leg went out; ADL's / Homemaking Assistance Needed: assist from family and friends Comments: about a month since tolerated sitting--mostly has been flat on her back; pain began in August and initially was using a cane to walk, having "flare ups" of pain and spasms, going to urgent cares until she had surgery   PT Goals (current goals can now be found in the care plan section) Acute Rehab PT Goals Patient Stated Goal: stop having spasms/pain PT Goal Formulation: With patient Time For Goal Achievement: 02/29/20 Potential to Achieve Goals: Good Progress towards PT goals: Progressing toward goals    Frequency    Min 5X/week      PT Plan Current plan remains  appropriate    Co-evaluation PT/OT/SLP Co-Evaluation/Treatment: Yes Reason for Co-Treatment: For patient/therapist safety;To address functional/ADL transfers;Necessary to address cognition/behavior during functional activity;Complexity of the patient's impairments (multi-system involvement) PT goals addressed during session: Mobility/safety with mobility;Proper use of DME OT goals addressed during session: ADL's and self-care;Proper use of Adaptive equipment and DME;Strengthening/ROM      AM-PAC PT "6 Clicks" Mobility   Outcome Measure  Help needed turning from your back to your side while in a flat bed without using bedrails?: A Little Help needed moving from lying on your back to sitting on the side of a flat bed without using bedrails?: A Lot Help needed moving to and from a bed to a chair (including a wheelchair)?: Total Help needed standing up from a chair using your arms (e.g., wheelchair or bedside chair)?: Total Help needed to walk in hospital room?: Total Help needed climbing 3-5 steps with a railing? : Total 6 Click Score: 9    End of Session   Activity Tolerance: Patient limited by pain Patient left: in bed;with call bell/phone within reach;with nursing/sitter in room;with SCD's reapplied Nurse Communication: Mobility status;Other (comment) (not tolerated sitting x 1 month) PT Visit Diagnosis: History of falling (Z91.81);Difficulty in walking, not elsewhere classified (R26.2);Pain Pain - Right/Left:  (  bilateral) Pain - part of body: Hip;Leg (back)     Time: 0981-1914 PT Time Calculation (min) (ACUTE ONLY): 22 min  Charges:  $Therapeutic Activity: 8-22 mins                      Arby Barrette, PT Pager [phone]    Rexanne Mano 02/16/2020, 1:42 PM

## 2020-02-16 NOTE — Evaluation (Signed)
Occupational Therapy Evaluation Patient Details Name: Carrie Cook MRN: [id number] DOB: 08/11/1960 Today's Date: 02/16/2020    History of Present Illness 60 y.o. female s/p right L3-4, L4-5 decompression 02/11/20 with postop improvement in right foot drop and radiculopathy but new severe contralateral radiculopathy with foot drop.02/14/20 Bilateral L3-4, L4-5 laminotomy, medial facetectomy for decompression of thecal sac and nerve roots including microdiscectomy at L4-5   Clinical Impression   Patient is s/p L3-4 L4-5 laminotomy decompresion of thecal sac and nerve roots including microdisectomy surgery resulting in functional limitations due to the deficits listed below (see OT problem list). Pt currently limited by pain from muscle spasms. Pt with x1 fall prior to admission and verbalizes fear of falling now. Pt able to progress to standing this session.  Patient will benefit from skilled OT acutely to increase independence and safety with ADLS to allow discharge CIR.     Follow Up Recommendations  CIR    Equipment Recommendations  3 in 1 bedside commode;Other (comment) (RW)    Recommendations for Other Services Rehab consult     Precautions / Restrictions Precautions Precautions: Back;Fall Precaution Comments: pt with 1 fall at home after 1st surgery on 02/11/20      Mobility Bed Mobility Overal bed mobility: Needs Assistance Bed Mobility: Rolling;Supine to Sit;Sit to Supine Rolling: Min assist (heavy use of bed rail)   Supine to sit: Mod assist Sit to supine: Mod assist   General bed mobility comments: pt demonstrates log rolling min guard (A) initially but several times during session needed physical (A) at min level with pad used. Pt progressed L side sitting with bed elevated to help with positioning of patient. pt reports pain immediately increase with sitting. pt reports increased tolerance with medication one hour prior to session. pt progressed to standing with RW  with cues to space bil LE shoulder width apart. pt with return to bed anxious and holding onto RW. pt requires cues for return to bed and (A) to lift bil LE onto bed surface and facilitation at shoulder to remain on side without trunk rotation. pt total +2 max (A) to scoot toward [facility]    Transfers Overall transfer level: Needs assistance Equipment used: Rolling walker (2 wheeled) Transfers: Sit to/from Stand Sit to Stand: +2 physical assistance;Mod assist;From elevated surface         General transfer comment: pt requires (A) to elevate from bed surface with strong reliance on BIL UE. pt able to side step toward [facility] with increased time. pt reports legs are weak due to prolonged bed positioning    Balance Overall balance assessment: Needs assistance Sitting-balance support: Bilateral upper extremity supported;Feet supported Sitting balance-Leahy Scale: Fair     Standing balance support: Bilateral upper extremity supported;During functional activity Standing balance-Leahy Scale: Poor Standing balance comment: reliant on RW                           ADL either performed or assessed with clinical judgement   ADL Overall ADL's : Needs assistance/impaired Eating/Feeding: Modified independent Eating/Feeding Details (indicate cue type and reason): bed level with side table near         Lower Body Bathing: Total assistance       Lower Body Dressing: Total assistance Lower Body Dressing Details (indicate cue type and reason): don socks. demonstrates knee flexion but unable to cross       Toileting - Clothing Manipulation Details (indicate cue type and reason): pt  declined to remove purewick and to transfer to 3n1 due to pain/ concern for increased pain sitting       General ADL Comments: pt progressed to EOB sitting then standing this session. pt fatigued with static standing x2 attempts     Vision   Vision Assessment?: No apparent visual deficits     Perception      Praxis      Pertinent Vitals/Pain Pain Assessment: 0-10 Pain Score: 9  Pain Location: buttocks and legs Pain Descriptors / Indicators: Cramping Pain Intervention(s): Monitored during session;Premedicated before session;Repositioned     Hand Dominance Right   Extremity/Trunk Assessment Upper Extremity Assessment Upper Extremity Assessment: Generalized weakness   Lower Extremity Assessment Lower Extremity Assessment: Defer to PT evaluation   Cervical / Trunk Assessment Cervical / Trunk Assessment: Other exceptions Cervical / Trunk Exceptions: s/p x2 surgeries   Communication Communication Communication: No difficulties   Cognition Arousal/Alertness: Awake/alert Behavior During Therapy: Anxious;WFL for tasks assessed/performed Overall Cognitive Status: Within Functional Limits for tasks assessed                                 General Comments: pt reports "i actually feel better now that i did that". pt becomes anxious with sitting task as she is anticipating the increase in pain with the sitting position   General Comments  dressing on wound appears loose due to patient sweating.    Exercises Other Exercises Other Exercises: encouraged supine hip / knee flexion x5 reps prior to any mobility   Shoulder Instructions      Home Living Family/patient expects to be discharged to:: Private residence Living Arrangements: Spouse/significant other Available Help at Discharge: Family;Available PRN/intermittently Type of Home: House Home Access: Stairs to enter [employer] of Steps: 3 Entrance Stairs-Rails: None Home Layout: One level     Bathroom Shower/Tub: Occupational psychologist: Standard Bathroom Accessibility: Yes   Home Equipment: Environmental consultant - 2 wheels;Walker - 4 wheels;Cane - single point;Grab bars - tub/shower;Bedside commode;Toilet riser   Additional Comments: pt reports she will be home alone during the day      Prior  Functioning/Environment Level of Independence: Needs assistance  Gait / Transfers Assistance Needed: using RW to get to bathroom; lots of near falls; one fall since surgery when left leg went out; ADL's / Homemaking Assistance Needed: assist from family and friends   Comments: about a month since tolerated sitting--mostly has been flat on her back; pain began in August and initially was using a cane to walk, having "flare ups" of pain and spasms, going to urgent cares until she had surgery        OT Problem List: Decreased activity tolerance;Decreased strength;Impaired balance (sitting and/or standing);Decreased safety awareness;Decreased knowledge of use of DME or AE;Decreased knowledge of precautions;Pain      OT Treatment/Interventions: Self-care/ADL training;Therapeutic exercise;Neuromuscular education;Energy conservation;DME and/or AE instruction;Manual therapy;Modalities;Therapeutic activities;Balance training;Patient/family education    OT Goals(Current goals can be found in the care plan section) Acute Rehab OT Goals Patient Stated Goal: stop having spasms/pain OT Goal Formulation: With patient Time For Goal Achievement: 03/01/20 Potential to Achieve Goals: Good  OT Frequency: Min 2X/week   Barriers to D/C: Decreased caregiver support  will be home alone during the day per patient       Co-evaluation PT/OT/SLP Co-Evaluation/Treatment: Yes Reason for Co-Treatment: For patient/therapist safety;To address functional/ADL transfers   OT goals addressed during session: ADL's and  self-care;Proper use of Adaptive equipment and DME;Strengthening/ROM      AM-PAC OT "6 Clicks" Daily Activity     Outcome Measure Help from another person eating meals?: None Help from another person taking care of personal grooming?: A Little Help from another person toileting, which includes using toliet, bedpan, or urinal?: A Lot Help from another person bathing (including washing, rinsing,  drying)?: A Lot Help from another person to put on and taking off regular upper body clothing?: A Little Help from another person to put on and taking off regular lower body clothing?: A Lot 6 Click Score: 16   End of Session Equipment Utilized During Treatment: Rolling walker Nurse Communication: Mobility status;Precautions  Activity Tolerance: Patient tolerated treatment well Patient left: in bed;with call bell/phone within reach;with bed alarm set  OT Visit Diagnosis: Unsteadiness on feet (R26.81);Muscle weakness (generalized) (M62.81);Pain                Time: 3419-3790 OT Time Calculation (min): 26 min Charges:  OT General Charges $OT Visit: 1 Visit OT Evaluation $OT Eval Moderate Complexity: 1 Mod   Brynn, OTR/L  Acute Rehabilitation Services Pager: [phone] Office: [phone] .   Jeri Modena 02/16/2020, 1:20 PM

## 2020-02-16 NOTE — Progress Notes (Signed)
Neurosurgery Service Progress Note  Subjective: No acute events overnight, continued significant muscle spasms in the back, not complaining of much actual back pain   Objective: Vitals:   02/15/20 1612 02/15/20 2029 02/15/20 2334 02/16/20 0418  BP: 126/74 124/78 108/72 122/78  Pulse: 98 100 100 100  Resp: 13 19 17 18   Temp: 99 F (37.2 C) 99.2 F (37.3 C)  98.5 F (36.9 C)  TempSrc: Oral Oral  Oral  SpO2: 97% 97% 96% 96%  Weight:      Height:        Physical Exam: AOx3, PERRL, EOMI, FS, TM, Strength 5/5 except 4-/5 in L EHL/DF  Assessment & Plan: 60 y.o. woman s/p MIS L L3-4/4-5 microdisc with resolution of preop symptoms and post-op delayed new right / contralateral weakness with worsening of canal stenosis s/p open L3-4 / 4-5 decompression.  -will change diazepam to cyclobenzaprine to help w/ muscle spasms -will try to get control of muscle spasms and see how she does w/ PT today -still appears asymptomatic from +COVID -SCDs/TEDs, SQH  Judith Part  02/16/20 7:28 AM

## 2020-02-17 LAB — GLUCOSE, CAPILLARY
Glucose-Capillary: 168 mg/dL — ABNORMAL HIGH (ref 70–99)
Glucose-Capillary: 163 mg/dL — ABNORMAL HIGH (ref 70–99)
Glucose-Capillary: 179 mg/dL — ABNORMAL HIGH (ref 70–99)

## 2020-02-17 NOTE — Progress Notes (Signed)
Neurosurgery Service Progress Note  Subjective: No acute events overnight, no improvement in lumbar muscle spasms but legs improving  Objective: Vitals:   02/17/20 0055 02/17/20 0314 02/17/20 0315 02/17/20 0754  BP:  99/66 99/66 (!) 148/66  Pulse: 92 99 97 97  Resp: 20 15 15 11   Temp:   98.7 F (37.1 C) 98.5 F (36.9 C)  TempSrc:   Oral Oral  SpO2:  96% 96% 96%  Weight:      Height:        Physical Exam: AOx3, PERRL, EOMI, FS, TM, Strength 5/5 except 4-/5 in L EHL/DF  Assessment & Plan: 60 y.o. woman s/p MIS L L3-4/4-5 microdisc with resolution of preop symptoms and post-op delayed new right / contralateral weakness with worsening of canal stenosis s/p open L3-4 / 4-5 decompression.  -unable to go to CIR 2/2 asymptomatic +COVID test. Will c/s SW to see what other options are for rehab discharge, cont working w/ PT/OT in the meantime  -SCDs/TEDs, Nadene Rubins A Taya Ashbaugh  02/17/20 1:32 PM

## 2020-02-17 NOTE — Progress Notes (Signed)
Physical Therapy Treatment Patient Details Name: Carrie Cook MRN: [id number] DOB: [birth year] Today's Date: 02/17/2020    History of Present Illness 60 y.o. female s/p right L3-4, L4-5 decompression 02/11/20 with postop improvement in right foot drop and radiculopathy but new severe contralateral radiculopathy with foot drop. Pt reports fall x 1 at home after surgery due to spasms. 02/14/20 Bilateral L3-4, L4-5 laminotomy, medial facetectomy for decompression of thecal sac and nerve roots including microdiscectomy at L4-5    PT Comments    Patient continues to have severe pain during transition sit to/from stand and in sitting, however she is pushing herself to do more each day and walked 10 ft with heavy reliance on RW. Discussed possibly using w/c at home when she is alone and she feels the w/c will fit everywhere in her house except for her bedroom. She is willing to consider this option to decr her risk of falling.    Follow Up Recommendations  CIR (pt will have to be off airborne/Covid precautions; otherwise SNF)     Equipment Recommendations  Wheelchair (measurements PT);Wheelchair cushion (measurements PT)    Recommendations for Other Services OT consult;Rehab consult     Precautions / Restrictions Precautions Precautions: Back;Fall Precaution Booklet Issued: No Precaution Comments: pt with 1 fall at home after 1st surgery on 02/11/20 Required Braces or Orthoses:  (NO brace)    Mobility  Bed Mobility Overal bed mobility: Needs Assistance Bed Mobility: Rolling;Sidelying to Sit;Sit to Sidelying Rolling: Min guard (heavy use of bed rail) Sidelying to sit: Min assist;HOB elevated     Sit to sidelying: Min assist General bed mobility comments: pt demonstrates log rolling min guard (A). Pt progressed L side sitting with bed elevated to help with positioning of patient. pt reports pain immediately increase with sitting. pt reports increased tolerance with medication one hour  prior to session. pt progressed to standing with RW with cues to space bil LE shoulder width apart. rotation. pt total +2 max (A) to scoot toward [facility]  Transfers Overall transfer level: Needs assistance Equipment used: Rolling walker (2 wheeled) Transfers: Sit to/from Stand Sit to Stand: +2 physical assistance;Mod assist;From elevated surface         General transfer comment: pt requires (A) to elevate from bed surface with strong reliance on BIL UE. pt able to side step toward [facility] with increased time. pt reports legs are weak due to prolonged bed positioning  Ambulation/Gait Ambulation/Gait assistance: Min assist;+2 physical assistance Gait Distance (Feet): 10 Feet Assistive device: Rolling walker (2 wheeled) Gait Pattern/deviations: Step-through pattern;Decreased stride length Gait velocity: very slow   General Gait Details: heavy reliance on bil UEs; after 5 ft, began to feel like spasms were coming and made 180 turn to return to bed   Secretary/administrator Wheelchair mobility:  (began discussion of use of w/c if home alone. Pt thinks this could be done and be helpful. She agrees sitting is painful AND she wants to minimize fall risk.)  Modified Rankin (Stroke Patients Only)       Balance Overall balance assessment: Needs assistance Sitting-balance support: Bilateral upper extremity supported;Feet supported Sitting balance-Leahy Scale: Fair     Standing balance support: Bilateral upper extremity supported;During functional activity Standing balance-Leahy Scale: Poor Standing balance comment: reliant on RW  Cognition Arousal/Alertness: Awake/alert Behavior During Therapy: Anxious;WFL for tasks assessed/performed Overall Cognitive Status: Within Functional Limits for tasks assessed                                        Exercises Other Exercises Other Exercises:  encouraged supine hip / knee flexion x5 reps prior to any mobility    General Comments        Pertinent Vitals/Pain Pain Assessment: 0-10 Pain Score: 9  Pain Location: buttocks and legs Pain Descriptors / Indicators: Cramping Pain Intervention(s): Limited activity within patient's tolerance;Monitored during session;Premedicated before session;Repositioned;Relaxation    Home Living                      Prior Function            PT Goals (current goals can now be found in the care plan section) Acute Rehab PT Goals Patient Stated Goal: stop having spasms/pain Time For Goal Achievement: 02/29/20 Potential to Achieve Goals: Good Progress towards PT goals: Progressing toward goals    Frequency    Min 5X/week      PT Plan Current plan remains appropriate    Co-evaluation              AM-PAC PT "6 Clicks" Mobility   Outcome Measure  Help needed turning from your back to your side while in a flat bed without using bedrails?: A Little Help needed moving from lying on your back to sitting on the side of a flat bed without using bedrails?: A Little Help needed moving to and from a bed to a chair (including a wheelchair)?: Total Help needed standing up from a chair using your arms (e.g., wheelchair or bedside chair)?: Total Help needed to walk in hospital room?: Total Help needed climbing 3-5 steps with a railing? : Total 6 Click Score: 10    End of Session Equipment Utilized During Treatment: Gait belt Activity Tolerance: Patient limited by pain Patient left: in bed;with call bell/phone within reach;with SCD's reapplied Nurse Communication: Mobility status;Other (comment) (not tolerated sitting x 1 month) PT Visit Diagnosis: History of falling (Z91.81);Difficulty in walking, not elsewhere classified (R26.2);Pain Pain - Right/Left:  (bilateral) Pain - part of body: Hip;Leg (back)     Time: 1336-1410 PT Time Calculation (min) (ACUTE ONLY): 34  min  Charges:  $Therapeutic Activity: 23-37 mins                      Arby Barrette, PT Pager [phone]    Rexanne Mano 02/17/2020, 4:18 PM

## 2020-02-17 NOTE — Progress Notes (Signed)
Inpatient Rehabilitation Admissions Coordinator  Inpatient rehab consult./prescreen received. Noted COVID + 02/13/2020. Patients are eligible to be considered for admit to the [facility] when cleared from airborne precautions by acute MD or Infectious disease regardless of onset day. Otherwise they will need to be >20 days from their positive test with recovery/improvement in symptoms  or 2 negative tests. Please call me with any questions. I will follow. We are unable to consider her for CIR admit at this time. We will sign off. I contacted Vinnie, PA to discuss and he will let Dr. Joaquim Nam know.   Danne Baxter, RN, MSN Rehab Admissions Coordinator [phone] 02/17/2020 11:26 AM

## 2020-02-17 NOTE — Progress Notes (Signed)
This nurse was able to keep pts pain under control with po medication. This nurse did not have to give any iv meds. Patient reported that her feet feel less numb and more sensation in lower extremities.

## 2020-02-18 LAB — GLUCOSE, CAPILLARY
Glucose-Capillary: 155 mg/dL — ABNORMAL HIGH (ref 70–99)
Glucose-Capillary: 149 mg/dL — ABNORMAL HIGH (ref 70–99)
Glucose-Capillary: 120 mg/dL — ABNORMAL HIGH (ref 70–99)
Glucose-Capillary: 158 mg/dL — ABNORMAL HIGH (ref 70–99)
Glucose-Capillary: 109 mg/dL — ABNORMAL HIGH (ref 70–99)

## 2020-02-18 MED ORDER — BACLOFEN 10 MG PO TABS
5.0000 mg | ORAL_TABLET | Freq: Three times a day (TID) | ORAL | Status: DC
  Administered 2020-02-18 – 2020-02-20 (×6): 5 mg via ORAL
  Filled 2020-02-18 (×7): qty 1

## 2020-02-18 NOTE — Progress Notes (Signed)
Occupational Therapy Treatment Patient Details Name: Carrie Cook MRN: [id number] DOB: Sep 07, 1960 Today's Date: 02/18/2020    History of present illness 60 y.o. female s/p right L3-4, L4-5 decompression 02/11/20 with postop improvement in right foot drop and radiculopathy but new severe contralateral radiculopathy with foot drop. Pt reports fall x 1 at home after surgery due to spasms. 02/14/20 Bilateral L3-4, L4-5 laminotomy, medial facetectomy for decompression of thecal sac and nerve roots including microdiscectomy at L4-5   OT comments  Pt progressed 61ft to bathroom and back during session with total+2 max (A) RW. Pt required restbreak x2 to rest L arm due to fatigue from pressure on RW. Pt able to overcome anxiety during session to complete task as she was motivated to attempt toilet transfer. Pt tearful stating "I just dont know how I got like there. The spasms are just so awful." recommendation for CIR at this time..  Follow Up Recommendations  CIR    Equipment Recommendations  3 in 1 bedside commode;Other (comment)    Recommendations for Other Services Rehab consult    Precautions / Restrictions Precautions Precautions: Back;Fall Precaution Comments: pt with 1 fall at home after 1st surgery on 02/11/20       Mobility Bed Mobility Overal bed mobility: Needs Assistance Bed Mobility: Rolling;Sidelying to Sit;Sit to Sidelying Rolling: Min assist   Supine to sit: Mod assist Sit to supine: Mod assist   General bed mobility comments: pt needs mod cues to sequence bed to EOB and (A) to stabilize at EOB initially. pt with spasms. pt tearful and anxious about movement. Pt requires crying stating i am just scared i am going to fall then anticipating pain with toilet transfer. pt educated to take deep breath and to keep attempting mobility to keep from becoming tense in her muscles pausing to cry. pt able to progress using this as a motivator. pt reports feeling better from  continuing  Transfers Overall transfer level: Needs assistance Equipment used: Rolling walker (2 wheeled) Transfers: Sit to/from Stand Sit to Stand: +2 physical assistance;Mod assist;From elevated surface         General transfer comment: pt requires (A) to elevate from bed surface with strong reliance on BIL UE. pt able to side step toward [facility] with increased time. pt reports legs are weak due to prolonged bed positioning    Balance Overall balance assessment: Needs assistance Sitting-balance support: Bilateral upper extremity supported;Feet supported Sitting balance-Leahy Scale: Fair     Standing balance support: Bilateral upper extremity supported;During functional activity Standing balance-Leahy Scale: Poor Standing balance comment: reliant on RW                           ADL either performed or assessed with clinical judgement   ADL Overall ADL's : Needs assistance/impaired Eating/Feeding: Modified independent;Bed level Eating/Feeding Details (indicate cue type and reason): pt reports inability to sit up to eat                 Lower Body Dressing: Maximal assistance;Sit to/from stand   Toilet Transfer: +2 for physical assistance;Maximal assistance Toilet Transfer Details (indicate cue type and reason): attempting to void bowels unsuccessful but able to void bladder Toileting- Clothing Manipulation and Hygiene: Total assistance Toileting - Clothing Manipulation Details (indicate cue type and reason): static standign with max (A) from PT and RW. OT completed hygiene. pt noted to have hemorroid at this time. pt reports not voiding for 1 week  Functional mobility during ADLs: +2 for safety/equipment;Maximal assistance;Rolling walker General ADL Comments: pt motivated as she recognizes the need to void bowels. pt tearful at eob and reports anxiety that she will fall     Vision       Perception     Praxis      Cognition Arousal/Alertness:  Awake/alert Behavior During Therapy: Anxious;WFL for tasks assessed/performed Overall Cognitive Status: Within Functional Limits for tasks assessed                                 General Comments: anxious about falling        Exercises     Shoulder Instructions       General Comments      Pertinent Vitals/ Pain       Pain Assessment: Faces Faces Pain Scale: Hurts even more Pain Location: buttocks and legs Pain Descriptors / Indicators: Cramping Pain Intervention(s): Monitored during session;Premedicated before session;Repositioned  Home Living                                          Prior Functioning/Environment              Frequency  Min 2X/week        Progress Toward Goals  OT Goals(current goals can now be found in the care plan section)  Progress towards OT goals: Progressing toward goals  Acute Rehab OT Goals Patient Stated Goal: stop having spasms/pain OT Goal Formulation: With patient Time For Goal Achievement: 03/01/20 Potential to Achieve Goals: Good ADL Goals Pt Will Perform Lower Body Dressing: with min assist;with adaptive equipment;sit to/from stand Pt Will Transfer to Toilet: with min assist;stand pivot transfer;bedside commode Additional ADL Goal #1: pt will complete bed mobility supervision level as precursor to adls. Additional ADL Goal #2: pt will complete basic transfer with RW mod (A) as precursor toa dls.  Plan Discharge plan remains appropriate    Co-evaluation    PT/OT/SLP Co-Evaluation/Treatment: Yes Reason for Co-Treatment: To address functional/ADL transfers;For patient/therapist safety;Necessary to address cognition/behavior during functional activity;Complexity of the patient's impairments (multi-system involvement)   OT goals addressed during session: ADL's and self-care;Proper use of Adaptive equipment and DME;Strengthening/ROM      AM-PAC OT "6 Clicks" Daily Activity     Outcome  Measure   Help from another person eating meals?: None Help from another person taking care of personal grooming?: A Little Help from another person toileting, which includes using toliet, bedpan, or urinal?: A Lot Help from another person bathing (including washing, rinsing, drying)?: A Lot Help from another person to put on and taking off regular upper body clothing?: A Little Help from another person to put on and taking off regular lower body clothing?: A Lot 6 Click Score: 16    End of Session Equipment Utilized During Treatment: Rolling walker  OT Visit Diagnosis: Unsteadiness on feet (R26.81);Muscle weakness (generalized) (M62.81);Pain   Activity Tolerance Patient tolerated treatment well   Patient Left in bed;with call bell/phone within reach;with bed alarm set   Nurse Communication Mobility status;Precautions        Time: 1751-0258 OT Time Calculation (min): 36 min  Charges: OT General Charges $OT Visit: 1 Visit OT Treatments $Self Care/Home Management : 8-22 mins   Brynn, OTR/L  Acute Rehabilitation Services Pager: [phone] Office: [phone] .  Jeri Modena 02/18/2020, 4:31 PM

## 2020-02-18 NOTE — Progress Notes (Deleted)
Pt's heartrate up to 140's. Pt denies chest pain, but says she does feel short of breath. When asked, the pt said that she does feel anxious. Dr. Bobbye Morton was sent a secure chat to inform her and request something for anxiety. See new orders.   1430: Despite receiving PCA doses, scheduled Toradol and Robaxin, and Buspirone, pt's heartrate in high 130's. Dr. Bobbye Morton was messaged again, says she will come to bedside. See new orders for EKG, chest x-ray, and Lopressor PRN. Lopressor given by this RN.   1500: Pt heartrate between 110-120.   Justice Rocher, RN

## 2020-02-18 NOTE — Progress Notes (Signed)
Inpatient Rehabilitation Admissions Coordinator   Notified by Dr. Joaquim Nam that patient will be out of airborne isolation 10 days form onset which will be 1/31. I will follow her progress from a distance to assess for Cir need. In the meantime, other rehab venues need to be pursued.  Danne Baxter, RN, MSN Rehab Admissions Coordinator [phone] 02/18/2020 10:16 AM

## 2020-02-18 NOTE — Progress Notes (Addendum)
Neurosurgery Service Progress Note  Subjective: No acute events overnight, no change in Sx  Objective: Vitals:   02/17/20 2014 02/17/20 2314 02/18/20 0337 02/18/20 0911  BP: 126/73 139/80 125/80 (!) 141/78  Pulse: 99 97 99 (!) 103  Resp: 16 16 16 15   Temp:  98.7 F (37.1 C) 98.9 F (37.2 C)   TempSrc:  Oral Oral Oral  SpO2: 99% 95% 97% 98%  Weight:      Height:        Physical Exam: AOx3, PERRL, EOMI, FS, TM, Strength 5/5 except 4-/5 in L EHL/DF  Assessment & Plan: 60 y.o. woman s/p MIS L L3-4/4-5 microdisc with resolution of preop symptoms and post-op delayed new right / contralateral weakness with worsening of canal stenosis s/p open L3-4 / 4-5 decompression.  -unable to go to CIR at the moment 2/2 asymptomatic +COVID test on 1/21. Will be done with 10d quarantine 1/31 then she will be clear for transfer to CIR.  -sig muscle spasms persist, they have been unresponsive to diazepam, cyclobenzaprine, tizanidine, methocarbamol, will try baclofen -SCDs/TEDs, SQH  Judith Part  02/18/20 10:07 AM

## 2020-02-18 NOTE — Progress Notes (Signed)
Patient has not had bowel movement since 1/19, walked to bathroom with PT during the day but was unable to go. Bowel sounds active in all quadrants, abdomen soft and nontender, patient passing gas.   This RN noted red rash on pt buttocks and inner thighs, cleansed and barrier cream applied  2150- Dulcolax suppository given, pt called about 40 minutes later to use commode, pt helped to commode with 2 assist due to spasms in hips/buttocks and BLE weakness. Patient passed gas but no bowel movement, helped back to bed, patient in 9.5/10 pain after transfer, Robaxin and Dialudid given per PRN orders  0200- patient called saying she felt the need for a bowel movement, put patient on bed pan and patient had small solid bowel movement as well as liquid stool and stated she still felt she had more but was straining so this RN advised to try again later when she felt the urge to go again  0530- when changing patient's sheets this RN noted a moderate amount of serosanguinous drainage on patient's dressing that reached the edge of the dressing, a small amount more than what was noted at the beginning of the shift

## 2020-02-18 NOTE — Progress Notes (Signed)
Physical Therapy Treatment Patient Details Name: Carrie Cook MRN: [id number] DOB: [birth year] Today's Date: 02/18/2020    History of Present Illness 60 y.o. female s/p right L3-4, L4-5 decompression 02/11/20 with postop improvement in right foot drop and radiculopathy but new severe contralateral radiculopathy with foot drop. Pt reports fall x 1 at home after surgery due to spasms. 02/14/20 Bilateral L3-4, L4-5 laminotomy, medial facetectomy for decompression of thecal sac and nerve roots including microdiscectomy at L4-5    PT Comments    Pt is slowly progressing toward goals, but still limited by spasms and limited sitting tolerance.  Emphasis on rolling and transition from side to/from sitting, sitting tolerance at EOB/toilet, sit to stand technique/safety, progressing gait in the RW.    Follow Up Recommendations  CIR     Equipment Recommendations  Wheelchair (measurements PT);Wheelchair cushion (measurements PT)    Recommendations for Other Services       Precautions / Restrictions Precautions Precautions: Back;Fall Precaution Comments: pt with 1 fall at home after 1st surgery on 02/11/20    Mobility  Bed Mobility Overal bed mobility: Needs Assistance Bed Mobility: Rolling;Sidelying to Sit;Sit to Sidelying Rolling: Min assist   Supine to sit: Mod assist Sit to supine: Mod assist   General bed mobility comments: pt needs mod cues to sequence bed to EOB and (A) to stabilize at EOB initially. pt with spasms. pt tearful and anxious about movement. Pt requires crying stating i am just scared i am going to fall then anticipating pain with toilet transfer. pt educated to take deep breath and to keep attempting mobility to keep from becoming tense in her muscles pausing to cry. pt able to progress using this as a motivator. pt reports feeling better from continuing  Transfers Overall transfer level: Needs assistance Equipment used: Rolling walker (2 wheeled) Transfers: Sit  to/from Stand Sit to Stand: +2 physical assistance;Mod assist;From elevated surface         General transfer comment: pt requires (A) to elevate from bed surface with strong reliance on BIL UE. pt able to side step toward [facility] with increased time. pt reports legs are weak due to prolonged bed positioning  Ambulation/Gait Ambulation/Gait assistance: Mod assist;+2 physical assistance;Max assist Gait Distance (Feet): 15 Feet (x2) Assistive device: Rolling walker (2 wheeled) Gait Pattern/deviations: Step-through pattern;Decreased stride length Gait velocity: slow Gait velocity interpretation: <1.31 ft/sec, indicative of household ambulator General Gait Details: unsteady overall, heavy reliance on the RW.  Return 15 to bed was marked by consistent sag in the knees with extra assist at times.  2nd person necessary to maneuver the RW.   Stairs             Wheelchair Mobility    Modified Rankin (Stroke Patients Only)       Balance Overall balance assessment: Needs assistance Sitting-balance support: Bilateral upper extremity supported;Feet supported Sitting balance-Leahy Scale: Fair     Standing balance support: Bilateral upper extremity supported;During functional activity Standing balance-Leahy Scale: Poor Standing balance comment: reliant on RW and most of the time external support                            Cognition Arousal/Alertness: Awake/alert Behavior During Therapy: Anxious;WFL for tasks assessed/performed Overall Cognitive Status: Within Functional Limits for tasks assessed  General Comments: anxious about falling      Exercises Other Exercises Other Exercises: encouraged supine hip / knee flexion x5 reps prior to any mobility and every 1-2 hours to moderate pain and improve stiffness    General Comments        Pertinent Vitals/Pain Pain Assessment: Faces Faces Pain Scale: Hurts even more Pain  Location: buttocks and legs Pain Descriptors / Indicators: Cramping Pain Intervention(s): Monitored during session    Home Living                      Prior Function            PT Goals (current goals can now be found in the care plan section) Acute Rehab PT Goals Patient Stated Goal: stop having spasms/pain PT Goal Formulation: With patient Time For Goal Achievement: 02/29/20 Potential to Achieve Goals: Good Progress towards PT goals: Progressing toward goals    Frequency    Min 5X/week      PT Plan Current plan remains appropriate    Co-evaluation PT/OT/SLP Co-Evaluation/Treatment: Yes Reason for Co-Treatment: To address functional/ADL transfers PT goals addressed during session: Mobility/safety with mobility OT goals addressed during session: ADL's and self-care      AM-PAC PT "6 Clicks" Mobility   Outcome Measure  Help needed turning from your back to your side while in a flat bed without using bedrails?: A Little Help needed moving from lying on your back to sitting on the side of a flat bed without using bedrails?: A Lot Help needed moving to and from a bed to a chair (including a wheelchair)?: A Lot Help needed standing up from a chair using your arms (e.g., wheelchair or bedside chair)?: A Lot Help needed to walk in hospital room?: A Lot Help needed climbing 3-5 steps with a railing? : Total 6 Click Score: 12    End of Session   Activity Tolerance: Patient limited by pain Patient left: in bed;with call bell/phone within reach;with SCD's reapplied Nurse Communication: Mobility status PT Visit Diagnosis: Other abnormalities of gait and mobility (R26.89);History of falling (Z91.81);Difficulty in walking, not elsewhere classified (R26.2);Pain Pain - part of body:  (back and leg spasms)     Time: 0539-7673 PT Time Calculation (min) (ACUTE ONLY): 36 min  Charges:  $Gait Training: 8-22 mins                     02/18/2020  Ginger Carne., PT Acute  Rehabilitation Services [phone]  (pager) [phone]  (office)   Tessie Fass Jacoby Ritsema 02/18/2020, 6:52 PM

## 2020-02-19 LAB — GLUCOSE, CAPILLARY
Glucose-Capillary: 164 mg/dL — ABNORMAL HIGH (ref 70–99)
Glucose-Capillary: 87 mg/dL (ref 70–99)
Glucose-Capillary: 144 mg/dL — ABNORMAL HIGH (ref 70–99)
Glucose-Capillary: 121 mg/dL — ABNORMAL HIGH (ref 70–99)

## 2020-02-19 MED ORDER — POLYETHYLENE GLYCOL 3350 17 G PO PACK
17.0000 g | PACK | Freq: Every day | ORAL | Status: DC
  Administered 2020-02-19 – 2020-02-23 (×5): 17 g via ORAL
  Filled 2020-02-19 (×6): qty 1

## 2020-02-19 MED ORDER — GERHARDT'S BUTT CREAM
TOPICAL_CREAM | Freq: Two times a day (BID) | CUTANEOUS | Status: DC
  Filled 2020-02-19: qty 1

## 2020-02-19 NOTE — Progress Notes (Signed)
Ordered placed for Gerhardt Butt Cream. Allergy alert populated for beclomethsone. RN spoke with pharmacist with ok to admin medication since it is topical.

## 2020-02-19 NOTE — TOC Initial Note (Signed)
Transition of Care [facility]) - Initial/Assessment Note    Patient Details  Name: Carrie Cook MRN: [id number] Date of Birth: [birth year]  [facility] of [facility] CM/SW Contact:    Vinie Sill, [place] Phone Number: 02/19/2020, 2:25 PM  Clinical Narrative:                  11:26am - called patient's room -no answer, unable to complete assessment  2:19 pm -called and informed patient's RN CSW trying to contact patient- he advised she may be sleep CSW spoke with patient's spouse,Carrie Cook. CSW introduced self and explained role. CSW discussed PT recommendation and SNF has back up plan to SNF. He states patient has been to rehab before in [place]. He is not against short term rehab at [facility] but requested CSW talk with the patient. He states she could better inform CSW of her choices. CSW attempted to call patient's cell [phone], no answer. CSW will attempt to call at a later time.  CSW will continue to follow and assist with discharge planning.  Thurmond Butts, MSW, LCSW Clinical Social Worker       Patient Goals and CMS Choice        Expected Discharge Plan and Services   In-house Referral: Clinical Social Work                                            Prior Living Arrangements/Services   Lives with:: Self,Spouse Patient language and need for interpreter reviewed:: No        Need for Family Participation in Patient Care: Yes (Comment) Care giver support system in place?: Yes (comment)   Criminal Activity/Legal Involvement Pertinent to Current Situation/Hospitalization: No - Comment as needed  Activities of Daily Living Home Assistive Devices/Equipment: Cane (specify quad or straight),Walker (specify type) ADL Screening (condition at time of admission) Patient's cognitive ability adequate to safely complete daily activities?: Yes Is the patient deaf or have difficulty hearing?: No Does the patient have difficulty seeing, even when wearing  glasses/contacts?: No Does the patient have difficulty concentrating, remembering, or making decisions?: Yes Patient able to express need for assistance with ADLs?: Yes Does the patient have difficulty dressing or bathing?: Yes Independently performs ADLs?: No (due to recent surgeries) Communication: Independent Dressing (OT): Needs assistance Is this a change from baseline?: Change from baseline, expected to last <3days Grooming: Needs assistance Is this a change from baseline?: Change from baseline, expected to last <3 days Feeding: Independent Bathing: Needs assistance Is this a change from baseline?: Change from baseline, expected to last <3 days Toileting: Needs assistance Is this a change from baseline?: Change from baseline, expected to last <3 days In/Out Bed: Needs assistance Is this a change from baseline?: Change from baseline, expected to last <3 days Walks in Home: Independent with device (comment) (cane & walker due to recent surgeries) Does the patient have difficulty walking or climbing stairs?: Yes Weakness of Legs: Both (mild, left normally weaker) Weakness of Arms/Hands: Both (mild)  Permission Sought/Granted Permission sought to share information with : Family Supports                Emotional Assessment       Orientation: : Oriented to Self,Oriented to Place,Oriented to  Time,Oriented to Situation Alcohol / Substance Use: Not Applicable Psych Involvement: No (comment)  Admission diagnosis:  Lumbar radiculopathy [M54.16] Surgery, elective [K16.9]  Post-operative pain [G89.18] Left foot drop [M21.372] Patient Active Problem List   Diagnosis Date Noted  . Left foot drop 02/14/2020  . Lumbar spinal stenosis 02/14/2020  . Lumbar radiculopathy 02/13/2020  . Acute right-sided back pain with sciatica 10/02/2019  . S/P total knee replacement, right 07/22/19  09/01/2019  . Primary localized osteoarthritis of left knee 07/22/2019  . Primary osteoarthritis of  right knee   . Constipation 09/05/2018  . Abdominal pain, chronic, epigastric 05/27/2018  . Dysphagia 05/27/2018  . Depression, major, single episode, moderate ([place]) 03/22/2018  . S/P complete hysterectomy 06/06/2017  . Derangement of posterior horn of medial meniscus of right knee   . Right knee pain 09/10/2015  . Iron deficiency anemia 10/16/2013  . Fibroids 08/21/2013  . Excessive or frequent menstruation 06/19/2013  . Morbid obesity ([place]) 07/12/2012  . Obstructive sleep apnea 06/21/2012  . Chest pain   . Hyperlipidemia   . Type 2 diabetes mellitus ([place])   . Obesity   . Laboratory test   . Nephrolithiasis   . Asthma   . Endometriosis   . Hypertension   . Iron deficiency anemia 05/11/2009  . GERD 05/11/2009   PCP:  Kathyrn Drown, MD Pharmacy:   [facility] [place] AT [address] [place] [address] Phone: [phone] Fax: [phone]     Social Determinants of Health (SDOH) Interventions    Readmission Risk Interventions No flowsheet data found.

## 2020-02-19 NOTE — Progress Notes (Signed)
Physical Therapy Treatment Patient Details Name: Carrie Cook MRN: [id number] DOB: 1960-07-07 Today's Date: 02/19/2020    History of Present Illness 60 y.o. female s/p right L3-4, L4-5 decompression 02/11/20 with postop improvement in right foot drop and radiculopathy but new severe contralateral radiculopathy with foot drop. Pt reports fall x 1 at home after surgery due to spasms. 02/14/20 Bilateral L3-4, L4-5 laminotomy, medial facetectomy for decompression of thecal sac and nerve roots including microdiscectomy at L4-5    PT Comments    Pt's progress slowed by significant pain in the usual form of spasms.  Emphasis today on warm up ROM to LE's, rolling for technique and relaxation, transition to EOB, scooting, sit to stand x3 with pre -gait activity in lieu of gait and side stepping up toward HOB.  Pt decline gait away from the bed today    Follow Up Recommendations  CIR     Equipment Recommendations  Wheelchair (measurements PT);Wheelchair cushion (measurements PT)    Recommendations for Other Services       Precautions / Restrictions Precautions Precautions: Back;Fall    Mobility  Bed Mobility Overal bed mobility: Needs Assistance Bed Mobility: Rolling;Sidelying to Sit;Sit to Sidelying Rolling: Min assist Sidelying to sit: Min assist     Sit to sidelying: Mod assist General bed mobility comments: Multiple rolls working on technique and with intent to relax muscles, truncal assist up to EOB and LE assist into bed.  Transfers Overall transfer level: Needs assistance Equipment used: Rolling walker (2 wheeled) Transfers: Sit to/from Stand Sit to Stand: Mod assist;From elevated surface         General transfer comment: Cues for hand placement, assist forward and for boost  x3 standing trials  Ambulation/Gait Ambulation/Gait assistance: Mod assist Gait Distance (Feet): 3 Feet (sidestepping toward HOB) Assistive device: Rolling walker (2 wheeled)            Stairs             Wheelchair Mobility    Modified Rankin (Stroke Patients Only)       Balance Overall balance assessment: Needs assistance Sitting-balance support: Bilateral upper extremity supported;Feet supported Sitting balance-Leahy Scale: Fair Sitting balance - Comments: really needs UE assist for pain control   Standing balance support: Bilateral upper extremity supported;During functional activity Standing balance-Leahy Scale: Poor Standing balance comment: reliant on RW and most of the time external support.  pt also worked on w/shift and marching in place withing the RW at [place]: Awake/alert Behavior During Therapy: Anxious;WFL for tasks assessed/performed Overall Cognitive Status: Within Functional Limits for tasks assessed                                        Exercises Other Exercises Other Exercises: completed 10+ hip/knee flexion extention "walk up and down's" for warm up.    General Comments General comments (skin integrity, edema, etc.): Reinforced back care, log rolling, transitions to/from sidelying and transfer safety.      Pertinent Vitals/Pain Pain Assessment: Faces Faces Pain Scale: Hurts even more Pain Location: buttocks and legs Pain Descriptors / Indicators: Spasm Pain Intervention(s): Monitored during session;Limited activity within patient's tolerance;Repositioned;Premedicated before session    Home Living  Prior Function            PT Goals (current goals can now be found in the care plan section) Acute Rehab PT Goals Patient Stated Goal: stop having spasms/pain PT Goal Formulation: With patient Time For Goal Achievement: 02/29/20 Potential to Achieve Goals: Good Progress towards PT goals: Progressing toward goals    Frequency    Min 5X/week      PT Plan Current plan remains appropriate     Co-evaluation              AM-PAC PT "6 Clicks" Mobility   Outcome Measure  Help needed turning from your back to your side while in a flat bed without using bedrails?: A Little Help needed moving from lying on your back to sitting on the side of a flat bed without using bedrails?: A Lot Help needed moving to and from a bed to a chair (including a wheelchair)?: A Lot Help needed standing up from a chair using your arms (e.g., wheelchair or bedside chair)?: A Lot Help needed to walk in hospital room?: A Lot Help needed climbing 3-5 steps with a railing? : Total 6 Click Score: 12    End of Session   Activity Tolerance: Patient limited by pain Patient left: in bed;with call bell/phone within reach;with SCD's reapplied Nurse Communication: Mobility status PT Visit Diagnosis: Other abnormalities of gait and mobility (R26.89);History of falling (Z91.81);Difficulty in walking, not elsewhere classified (R26.2);Pain Pain - part of body: Hip;Leg     Time: 6440-3474 PT Time Calculation (min) (ACUTE ONLY): 29 min  Charges:  $Therapeutic Activity: 23-37 mins                     02/19/2020  Ginger Carne., PT Acute Rehabilitation Services [phone]  (pager) [phone]  (office)   Tessie Fass Braelee Herrle 02/19/2020, 6:31 PM

## 2020-02-19 NOTE — Progress Notes (Signed)
Neurosurgery Service Progress Note  Subjective: No acute events overnight, no change in Sx, no BM but +flatus  Objective: Vitals:   02/18/20 2034 02/18/20 2253 02/19/20 0336 02/19/20 0841  BP: 130/77 128/65 115/71 136/87  Pulse: 96 (!) 110 92 90  Resp: 18  20 18   Temp: 98.8 F (37.1 C) 98.3 F (36.8 C) (!) 97.5 F (36.4 C) 98.1 F (36.7 C)  TempSrc: Oral Oral Oral Oral  SpO2: 96% 92% 93% 94%  Weight:      Height:        Physical Exam: AOx3, PERRL, EOMI, FS, TM, Strength 5/5 except 4-/5 in L EHL/DF Incision c/d/i  Assessment & Plan: 60 y.o. woman s/p MIS L L3-4/4-5 microdisc with resolution of preop symptoms and post-op delayed new right / contralateral weakness with worsening of canal stenosis s/p open L3-4 / 4-5 decompression.  -unable to go to CIR at the moment 2/2 asymptomatic +COVID test on 1/21. Will be done with 10d quarantine 1/31 then she will be clear for transfer to CIR.  -sig muscle spasms persist, they have been unresponsive to diazepam, cyclobenzaprine, tizanidine, methocarbamol, some improvement w/ baclofen -will increase baclofen after BM to avoid worsening constipation, start miralax today -SCDs/TEDs, SQH  Judith Part  02/19/20 8:50 AM

## 2020-02-20 LAB — GLUCOSE, CAPILLARY
Glucose-Capillary: 141 mg/dL — ABNORMAL HIGH (ref 70–99)
Glucose-Capillary: 225 mg/dL — ABNORMAL HIGH (ref 70–99)
Glucose-Capillary: 109 mg/dL — ABNORMAL HIGH (ref 70–99)
Glucose-Capillary: 147 mg/dL — ABNORMAL HIGH (ref 70–99)

## 2020-02-20 MED ORDER — BACLOFEN 10 MG PO TABS
10.0000 mg | ORAL_TABLET | Freq: Three times a day (TID) | ORAL | Status: DC
Start: 2020-02-20 — End: 2020-02-24
  Administered 2020-02-20 – 2020-02-24 (×12): 10 mg via ORAL
  Filled 2020-02-20 (×11): qty 1

## 2020-02-20 NOTE — Progress Notes (Signed)
Physical Therapy Treatment Patient Details Name: Carrie Cook MRN: [id number] DOB: 09/23/1960 Today's Date: 02/20/2020    History of Present Illness 60 y.o. female s/p right L3-4, L4-5 decompression 02/11/20 with postop improvement in right foot drop and radiculopathy but new severe contralateral radiculopathy with foot drop. Pt reports fall x 1 at home after surgery due to spasms. 02/14/20 Bilateral L3-4, L4-5 laminotomy, medial facetectomy for decompression of thecal sac and nerve roots including microdiscectomy at L4-5    PT Comments    Pt making noticeable improvements toward goals, pain meds starting to help manage pain/spasms enough to progress sitting EOB/in chair, to work on sit to stands, pre gait and progress gait stability/distance.  Left in the chair to work on sitting tolerance.    Follow Up Recommendations  CIR     Equipment Recommendations  Wheelchair (measurements PT);Wheelchair cushion (measurements PT)    Recommendations for Other Services       Precautions / Restrictions Precautions Precautions: Back;Fall    Mobility  Bed Mobility Overal bed mobility: Needs Assistance Bed Mobility: Rolling;Sidelying to Sit;Sit to Sidelying Rolling: Min assist Sidelying to sit: Min assist       General bed mobility comments: Multiple rolls working on technique and with intent to relax muscles, truncal assist up to EOB.  Pt able to scoot without assist and with use of UE's  Transfers Overall transfer level: Needs assistance Equipment used: Rolling walker (2 wheeled) Transfers: Sit to/from Stand Sit to Stand: Mod assist;From elevated surface         General transfer comment: Cues for hand placement, assist forward and for boost  x3 standing trials  Ambulation/Gait Ambulation/Gait assistance: Mod assist;+2 safety/equipment;+2 physical assistance Gait Distance (Feet): 15 Feet (x2) Assistive device: Rolling walker (2 wheeled) Gait Pattern/deviations: Step-through  pattern;Decreased stride length Gait velocity: slow Gait velocity interpretation: <1.31 ft/sec, indicative of household ambulator General Gait Details: Still unsteady, heavy reliance on the RW,  abrupt LE buckle, softer initially, but progressively more sag with pain/fatigue.  +2 for assist with RW and safety.   Stairs             Wheelchair Mobility    Modified Rankin (Stroke Patients Only)       Balance Overall balance assessment: Needs assistance Sitting-balance support: Bilateral upper extremity supported;No upper extremity supported;Feet supported Sitting balance-Leahy Scale: Fair Sitting balance - Comments: pt able to sit EOB without UE assist today for extend time.   Standing balance support: Bilateral upper extremity supported;During functional activity Standing balance-Leahy Scale: Poor Standing balance comment: reliant on RW and most of the time external support. Some pre gait work in standing at [place]: Awake/alert Behavior During Therapy: Anxious;WFL for tasks assessed/performed Overall Cognitive Status: Within Functional Limits for tasks assessed                                        Exercises Other Exercises Other Exercises: completed 10+ hip/knee flexion extention "walk up and down's" for warm up. Other Exercises: Worked on rolling for technique and warm up    General Comments General comments (skin integrity, edema, etc.): Again reinforced back care, log rolling, transitions to/from sidelying and transfer safety.      Pertinent Vitals/Pain Pain Assessment: 0-10  Pain Score: 5  Pain Location: buttocks and legs Pain Descriptors / Indicators: Spasm Pain Intervention(s): Monitored during session;Premedicated before session;Limited activity within patient's tolerance    Home Living                      Prior Function            PT Goals (current goals can  now be found in the care plan section) Acute Rehab PT Goals Patient Stated Goal: stop having spasms/pain PT Goal Formulation: With patient Time For Goal Achievement: 02/29/20 Potential to Achieve Goals: Good Progress towards PT goals: Progressing toward goals    Frequency    Min 5X/week      PT Plan Current plan remains appropriate    Co-evaluation              AM-PAC PT "6 Clicks" Mobility   Outcome Measure  Help needed turning from your back to your side while in a flat bed without using bedrails?: A Little Help needed moving from lying on your back to sitting on the side of a flat bed without using bedrails?: A Little Help needed moving to and from a bed to a chair (including a wheelchair)?: A Lot Help needed standing up from a chair using your arms (e.g., wheelchair or bedside chair)?: A Lot Help needed to walk in hospital room?: A Lot Help needed climbing 3-5 steps with a railing? : Total 6 Click Score: 13    End of Session   Activity Tolerance: Patient limited by pain;Patient tolerated treatment well Patient left: in chair;with call bell/phone within reach;with chair alarm set Nurse Communication: Mobility status PT Visit Diagnosis: Other abnormalities of gait and mobility (R26.89);History of falling (Z91.81);Difficulty in walking, not elsewhere classified (R26.2);Pain Pain - part of body: Hip;Leg     Time: 1310-1350 PT Time Calculation (min) (ACUTE ONLY): 40 min  Charges:  $Gait Training: 8-22 mins $Therapeutic Exercise: 8-22 mins $Therapeutic Activity: 8-22 mins                     02/20/2020  Ginger Carne., PT Acute Rehabilitation Services [phone]  (pager) [phone]  (office)   Tessie Fass Kaan Tosh 02/20/2020, 4:32 PM

## 2020-02-20 NOTE — Progress Notes (Signed)
Neurosurgery Service Progress Note  Subjective: No acute events overnight, +small BM yesterday, muscle spasms improving  Objective: Vitals:   02/19/20 2256 02/20/20 0331 02/20/20 0642 02/20/20 1251  BP: 122/82 (!) 141/83 121/90 118/67  Pulse: 86 89 93 98  Resp:  16  18  Temp: 98.1 F (36.7 C) 98.6 F (37 C)  97.9 F (36.6 C)  TempSrc: Oral Oral  Oral  SpO2: 100% 96% 94% 100%  Weight:      Height:        Physical Exam: AOx3, PERRL, EOMI, FS, TM, Strength 5/5 except 4-/5 in L EHL/DF Incision c/d/i  Assessment & Plan: 60 y.o. woman s/p MIS L L3-4/4-5 microdisc with resolution of preop symptoms and post-op delayed new right / contralateral weakness with worsening of canal stenosis s/p open L3-4 / 4-5 decompression.  -unable to go to CIR at the moment 2/2 asymptomatic +COVID test on 1/21. Will be done with 10d quarantine 1/31 then she will be clear for transfer to CIR.  -sig muscle spasms persist, they have been unresponsive to diazepam, cyclobenzaprine, tizanidine, methocarbamol, some improvement w/ baclofen -cont miralax, had a BM so we can increase the baclofen to help w/ spasms -SCDs/TEDs, SQH  Judith Part  02/20/20 1:47 PM

## 2020-02-20 NOTE — Progress Notes (Signed)
Inpatient Rehabilitation Admissions Coordinator  I spoke with patient by phone for rehab assessment as well as review of chart. We discussed goals ne expectations of a possible Cir admit pending her air borne precautions to be removed on 1/31. She prefers Cir rather than SNF if possible. I feel she is a good candidate pending bed availability . I will follow up with patient once airborne precautions discontinued next week.  Danne Baxter, RN, MSN Rehab Admissions Coordinator [phone] 02/20/2020 2:27 PM

## 2020-02-20 NOTE — Progress Notes (Signed)
Patient got out of bed with the help of PT, Oren Beckmann and I. Patient walked to door, sat down on bedside commode and walked to chair where she sat for 45 minutes. After the 45 minutes I returned to the room and assisted her with walking back to bed. Patient tolerated these activities well.  Shelbie Proctor, RN

## 2020-02-21 LAB — GLUCOSE, CAPILLARY
Glucose-Capillary: 149 mg/dL — ABNORMAL HIGH (ref 70–99)
Glucose-Capillary: 147 mg/dL — ABNORMAL HIGH (ref 70–99)
Glucose-Capillary: 109 mg/dL — ABNORMAL HIGH (ref 70–99)
Glucose-Capillary: 144 mg/dL — ABNORMAL HIGH (ref 70–99)

## 2020-02-21 NOTE — Progress Notes (Signed)
Honeycomb dressing saturated, leaking on bed. RN changed dressing.

## 2020-02-21 NOTE — Progress Notes (Signed)
Neurosurgery Service Progress Note  Subjective: No acute events overnight,   Objective: Vitals:   02/20/20 2320 02/21/20 0310 02/21/20 0824 02/21/20 1201  BP: 125/74 118/70 (!) 113/91 119/88  Pulse: 85 83 93 92  Resp: 17 16 18 18   Temp: 98 F (36.7 C) 98.2 F (36.8 C) 98.2 F (36.8 C) 98.3 F (36.8 C)  TempSrc: Oral Oral Oral Oral  SpO2: 96% 93% 96% 96%  Weight:      Height:        Physical Exam: AOx3, PERRL, EOMI, FS, TM, Strength 5/5 except 4-/5 in L EHL/DF Incision c/d/i  Assessment & Plan: 60 y.o. woman s/p MIS L L3-4/4-5 microdisc with resolution of preop symptoms and post-op delayed new right / contralateral weakness with worsening of canal stenosis s/p open L3-4 / 4-5 decompression.  -unable to go to CIR at the moment 2/2 asymptomatic +COVID test on 1/21. Will be done with 10d quarantine 1/31 then she will be clear for transfer to CIR.  -sig muscle spasms persist, they have been unresponsive to diazepam, cyclobenzaprine, tizanidine, methocarbamol, improvement w/ baclofen -cont miralax -SCDs/TEDs, SQH  Carrie Cook A Carrie Cook  02/21/20 12:12 PM

## 2020-02-22 LAB — GLUCOSE, CAPILLARY
Glucose-Capillary: 142 mg/dL — ABNORMAL HIGH (ref 70–99)
Glucose-Capillary: 143 mg/dL — ABNORMAL HIGH (ref 70–99)
Glucose-Capillary: 132 mg/dL — ABNORMAL HIGH (ref 70–99)
Glucose-Capillary: 96 mg/dL (ref 70–99)

## 2020-02-22 NOTE — Progress Notes (Signed)
Neurosurgery Service Progress Note  Subjective: No acute events overnight,   Objective: Vitals:   02/21/20 2320 02/22/20 0439 02/22/20 0736 02/22/20 0738  BP: 115/72 129/80 118/75   Pulse: (!) 103 76 82 82  Resp: 18 18 20    Temp: 98.6 F (37 C) 98.3 F (36.8 C) 98 F (36.7 C)   TempSrc: Oral Oral    SpO2: 94% 98% 96% 96%  Weight:      Height:        Physical Exam: AOx3, PERRL, EOMI, FS, TM, Strength 5/5 except 4- to 4/5 in L EHL/DF Incision c/d/i Abdomen soft, non-tender, non-distended, mildly hypertympanic  Assessment & Plan: 60 y.o. woman s/p MIS L L3-4/4-5 microdisc with resolution of preop symptoms and post-op delayed new right / contralateral weakness with worsening of canal stenosis s/p open L3-4 / 4-5 decompression.  -unable to go to CIR at the moment 2/2 asymptomatic +COVID test on 1/21. Will be done with 10d quarantine 1/31 then she will be clear for transfer to CIR.  -sig muscle spasms persist, they have been unresponsive to diazepam, cyclobenzaprine, tizanidine, methocarbamol, improvement w/ baclofen, cont baclofen -cont miralax, enema today -SCDs/TEDs, SQH  Judith Part  02/22/20 10:05 AM

## 2020-02-23 LAB — GLUCOSE, CAPILLARY
Glucose-Capillary: 145 mg/dL — ABNORMAL HIGH (ref 70–99)
Glucose-Capillary: 208 mg/dL — ABNORMAL HIGH (ref 70–99)
Glucose-Capillary: 111 mg/dL — ABNORMAL HIGH (ref 70–99)
Glucose-Capillary: 206 mg/dL — ABNORMAL HIGH (ref 70–99)

## 2020-02-23 NOTE — Progress Notes (Signed)
Physical Therapy Treatment Patient Details Name: Carrie Cook MRN: [id number] DOB: Aug 09, 1960 Today's Date: 02/23/2020    History of Present Illness 60 y.o. female s/p right L3-4, L4-5 decompression 02/11/20 with postop improvement in right foot drop and radiculopathy but new severe contralateral radiculopathy with foot drop. Pt reports fall x 1 at home after surgery due to spasms. 02/14/20 Bilateral L3-4, L4-5 laminotomy, medial facetectomy for decompression of thecal sac and nerve roots including microdiscectomy at L4-5    PT Comments    With moderating pain and less spasming, pt has started making steady progress toward goals.  Emphasis on warm up, rolling, transitions up via L elbow with minimal stability assist, scooting, sitting balance, sit to stands/technique, progressing ambulation in the RW and reinforcement of all back care/precaution.    Follow Up Recommendations  CIR     Equipment Recommendations  Other (comment) (TBA)    Recommendations for Other Services       Precautions / Restrictions Precautions Precautions: Back;Fall Precaution Comments: pt with 1 fall at home after 1st surgery on 02/11/20    Mobility  Bed Mobility Overal bed mobility: Needs Assistance Bed Mobility: Supine to Sit;Rolling Rolling: Min assist   Supine to sit: Min assist     General bed mobility comments: pt rolling R and L then R and then L before supine to sit with min (A). pt without pain during transfer compared to previous session. pt able to static sit at normal height without spasm  Transfers Overall transfer level: Needs assistance Equipment used: Rolling walker (2 wheeled) Transfers: Sit to/from Stand Sit to Stand: +2 physical assistance;Min assist         General transfer comment: first attempt from bed total +2 min (A) pt able to progress to Mod (A) with additional attempts during session.  Ambulation/Gait Ambulation/Gait assistance: Min assist Gait Distance (Feet): 20  Feet (and then 26 feet, both with RW) Assistive device: Rolling walker (2 wheeled) Gait Pattern/deviations: Step-through pattern;Decreased stride length Gait velocity: slower Gait velocity interpretation: <1.8 ft/sec, indicate of risk for recurrent falls General Gait Details: pt generally steady, light use fo the RW,  wider based steps, pt still feeling tingling in her LE's and feet, so gait appears mildly uncoordinated.  Fully upright posture today.   Stairs             Wheelchair Mobility    Modified Rankin (Stroke Patients Only)       Balance Overall balance assessment: Needs assistance Sitting-balance support: Bilateral upper extremity supported;Feet supported Sitting balance-Leahy Scale: Fair Sitting balance - Comments: Pt sat without need for UE's.  Much less tense and less anticipation of a spasm eminent.,   Standing balance support: Bilateral upper extremity supported;During functional activity Standing balance-Leahy Scale: Poor Standing balance comment: reliant on RW                            Cognition Arousal/Alertness: Awake/alert Behavior During Therapy: WFL for tasks assessed/performed Overall Cognitive Status: Within Functional Limits for tasks assessed                                        Exercises      General Comments General comments (skin integrity, edema, etc.): VSS pt very pleased with ability to move and expressed "i can't believe how tired and sore my butt is"  Pertinent Vitals/Pain Pain Assessment: Faces Faces Pain Scale: Hurts a little bit Pain Location: buttocks and foot Pain Descriptors / Indicators: Sore Pain Intervention(s): Monitored during session;Premedicated before session    Home Living                      Prior Function            PT Goals (current goals can now be found in the care plan section) Acute Rehab PT Goals Patient Stated Goal: to get up more PT Goal Formulation:  With patient Time For Goal Achievement: 02/29/20 Potential to Achieve Goals: Good Progress towards PT goals: Progressing toward goals    Frequency    Min 5X/week      PT Plan Current plan remains appropriate    Co-evaluation PT/OT/SLP Co-Evaluation/Treatment: Yes Reason for Co-Treatment: Complexity of the patient's impairments (multi-system involvement) PT goals addressed during session: Mobility/safety with mobility OT goals addressed during session: ADL's and self-care;Proper use of Adaptive equipment and DME      AM-PAC PT "6 Clicks" Mobility   Outcome Measure  Help needed turning from your back to your side while in a flat bed without using bedrails?: A Little Help needed moving from lying on your back to sitting on the side of a flat bed without using bedrails?: A Little Help needed moving to and from a bed to a chair (including a wheelchair)?: A Little Help needed standing up from a chair using your arms (e.g., wheelchair or bedside chair)?: A Little Help needed to walk in hospital room?: A Little Help needed climbing 3-5 steps with a railing? : A Lot 6 Click Score: 17    End of Session   Activity Tolerance: Patient tolerated treatment well;Patient limited by pain Patient left: in chair;with call bell/phone within reach;with chair alarm set Nurse Communication: Mobility status PT Visit Diagnosis: Other abnormalities of gait and mobility (R26.89);History of falling (Z91.81);Difficulty in walking, not elsewhere classified (R26.2);Pain Pain - Right/Left:  (bil) Pain - part of body:  (buttock and down post thighs)     Time: 5366-4403 PT Time Calculation (min) (ACUTE ONLY): 23 min  Charges:  $Gait Training: 8-22 mins                     02/23/2020  Carrie Carne., PT Acute Rehabilitation Services [phone]  (pager) [phone]  (office)   Carrie Cook 02/23/2020, 4:19 PM

## 2020-02-23 NOTE — H&P (Shared)
Physical Medicine and Rehabilitation Admission H&P    Chief Complaint  Patient presents with  . Hip Pain  : HPI: Carrie Cook is a 60 year old female with history of HTN, T2DM, R-TKR, RLE pain w Work up revealing moderate to severe spinal stenosis L3-4. She underwent L3/4 and L4/5 decompression on 01/19 at surgical center by Dr. Maurice Smallstergard. She was readmitted 01/21 due to new onset of left foot drop, severe muscle spasms, severe pain and fall. Marland Kitchen. MRI spine done revealing severe spinal canal narrowing at L3-L5 levels with increased conspicuity of epidural fat--question postprocedural edema contributing to stenosis. She underwent bilateral L3/4 and L4/5 facetectomy with decompression of thecal nerve roots. Post op neuropathy and LLE weakness improving but has had severe muscle spasms requiring multiple medication adjustment. She was also found to be Covid + but has been asymptomatic and has been on isolation.   Back wound drainage.    ROS    Past Medical History:  Diagnosis Date  . Anemia    PT HAS HAD COLONOSCOPY AND ENDOSCOPY WORK UP - NO PROBLEMS FOUND AS SOURCE OF ANEMIA -- PT HAD IRON INFUSION AT [facility] 11/13/12-AFTER SEEING HEMATOLOGIST DR. FORMANEK AND HE GAVE HEMATOLOGIC CLEARANCE FOR GASTRIC BYPASS SURGERY.  Marland Kitchen. Anxiety   . Asthma    daily and prn inhalers  . Degenerative joint disease    right knee, spine - STATES INTERMITTENT  NUMBNESS DOWN RT LEG WITH PROLONGED STANDING OR WALKING- THINKS RELATED TO HER SPINE PROBLEMS  . Dental crowns present   . Depression   . Family history of anesthesia complication    pt's mother and sister have hx. of post-op N/V  . Fibroids    UTERINE  . Gastroesophageal reflux disease    none for 2 years since Bariatric surgery.  . H/O hiatal hernia   . History of endometriosis   . History of kidney stones   . Hyperlipidemia   . Hypertension    under control with med., has been on med. x 2 yr.  . Insulin dependent diabetes mellitus   .  Lock jaw    jaw locks open if opens mouth wide  . Migraines   . Neuropathy    FEET  . Obesity   . Palpitations   . PONV (postoperative nausea and vomiting)   . Shortness of breath    with daily activities  . Sleep apnea    post-op didn't need CPAP but now states needs it again but not using    Past Surgical History:  Procedure Laterality Date  . ABDOMINAL HYSTERECTOMY N/A 06/06/2017   Procedure: HYSTERECTOMY ABDOMINAL;  Surgeon: Lazaro ArmsEure, Luther H, MD;  Location: [facility];  Service: Gynecology;  Laterality: N/A;  . BACK SURGERY    . BREATH TEK H PYLORI N/A 08/13/2012   Procedure: BREATH TEK H PYLORI;  Surgeon: Mariella SaaBenjamin T Hoxworth, MD;  [facility];  Service: General;  Laterality: N/A;  . CARPAL TUNNEL RELEASE  01/03/2012   Procedure: CARPAL TUNNEL RELEASE;  Surgeon: Nicki ReaperGary R Kuzma, MD;  Location: [facility];  Service: Orthopedics;  Laterality: Right;  . carpal tunnel release right hand Right 12/13  . COLONOSCOPY  05/2009   WUJ:[id number]:external hemorrhoids otherwise normal colon, rectum and terminal ileum  . COLONOSCOPY WITH ESOPHAGOGASTRODUODENOSCOPY (EGD) N/A 10/28/2012   Procedure: COLONOSCOPY WITH ESOPHAGOGASTRODUODENOSCOPY (EGD);  Surgeon: Corbin Adeobert M Rourk, MD;  Location: [facility];  Service: Endoscopy;  Laterality: N/A;  7:30  . DILITATION & CURRETTAGE/HYSTROSCOPY  WITH NOVASURE ABLATION N/A 07/22/2014   Procedure: DILATATION & CURETTAGE/HYSTEROSCOPY WITH NOVASURE ABLATION; uterine length 6.0 cm; uterine width 3.8 cm; total ablation time 1 minute 15 seconds;  Surgeon: Florian Buff, MD;  Location: [facility];  Service: Gynecology;  Laterality: N/A;  . ESOPHAGOGASTRODUODENOSCOPY  5/013/2011   [facility]:WEXHBZ esophagus/multiple polyps removed s/p (hyperplastic). Gastritis without H.pylori.  . ESOPHAGOGASTRODUODENOSCOPY (EGD) WITH PROPOFOL N/A 06/06/2018   with LA Grade A esophagitis s/p dilation.   Marland Kitchen GASTRIC ROUX-EN-Y N/A 11/19/2012   Procedure: LAPAROSCOPIC ROUX-EN-Y GASTRIC;   Surgeon: Edward Jolly, MD;  Location: [facility];  Service: General;  Laterality: N/A;  . GIVENS CAPSULE STUDY N/A 10/28/2012   Procedure: GIVENS CAPSULE STUDY;  Surgeon: Daneil Dolin, MD;  Location: [facility];  Service: Endoscopy;  Laterality: N/A;  . KNEE ARTHROSCOPY WITH MEDIAL MENISECTOMY Right 10/28/2015   Procedure: RIGHT KNEE ARTHROSCOPY WITH MEDIAL MENISECTOMY;  Surgeon: Carole Civil, MD;  Location: [facility];  Service: Orthopedics;  Laterality: Right;  . LAPAROSCOPY     due to endometriosis  . LUMBAR LAMINECTOMY/DECOMPRESSION MICRODISCECTOMY N/A 02/14/2020   Procedure: LUMBAR LAMINECTOMY  Lumbar three-four, Lumbar four-five;  Surgeon: Consuella Lose, MD;  Location: [facility];  Service: Neurosurgery;  Laterality: N/A;  . Venia Minks DILATION N/A 06/06/2018   Procedure: Venia Minks DILATION;  Surgeon: Daneil Dolin, MD;  [facility];  Service: Endoscopy;  Laterality: N/A;  . PELVIC LAPAROSCOPY  11/04/1999   with fulguration of endometriosis  . SALPINGOOPHORECTOMY Bilateral 06/06/2017   Procedure: SALPINGO OOPHORECTOMY;  Surgeon: Florian Buff, MD;  Location: [facility];  Service: Gynecology;  Laterality: Bilateral;  . TOTAL KNEE ARTHROPLASTY Right 07/22/2019   Procedure: TOTAL KNEE ARTHROPLASTY;  Surgeon: Carole Civil, MD;  Location: [facility];  Service: Orthopedics;  Laterality: Right;  . TRIGGER FINGER RELEASE Right 09/24/2012   Procedure: RELEASE A-1 PULLEY OF RIGHT THUMB;  Surgeon: Wynonia Sours, MD;  Location: [facility];  Service: Orthopedics;  Laterality: Right;  . TUBAL LIGATION  1993    Family History  Problem Relation Age of Onset  . Hypertension Father   . Hyperlipidemia Father   . COPD Father   . COPD Mother   . Heart disease Mother   . Cancer Mother        Cervix  . Anesthesia problems Mother        post-op N/V  . Thyroid disease Mother   . Diabetes Maternal Grandfather   . Thyroid disease Sister   . Anesthesia problems Sister         post-op N/V  . Thyroid disease Paternal Uncle   . Diabetes Other   . Colon cancer Neg Hx   . Colon polyps Neg Hx     Social History:  reports that she has never smoked. She has never used smokeless tobacco. She reports that she does not drink alcohol and does not use drugs.   Allergies  Allergen Reactions  . Qvar [Beclomethasone] Shortness Of Breath and Other (See Comments)    Patient states that this medication causes her to feel short of breath  . Sulfa Antibiotics Hives and Rash  . Tizanidine Shortness Of Breath, Itching and Rash  . Prozac [Fluoxetine Hcl] Hives  . Ace Inhibitors Cough  . Erythromycin Ethylsuccinate Hives and Nausea Only  . Hydrochlorothiazide Cough  . Lipitor [Atorvastatin Calcium] Other (See Comments)    Body aches, flu-like symptoms  . Dyazide [Hydrochlorothiazide W-Triamterene] Cough  . Erythromycin Nausea Only  .  Lisinopril Cough  . Pravastatin Other (See Comments)    BODY ACHES, FLU-LIKE SX.  . Sulfonamide Derivatives Hives and Rash    Medications Prior to Admission  Medication Sig Dispense Refill  . acetaminophen (TYLENOL) 500 MG tablet Take 1 tablet (500 mg total) by mouth every 6 (six) hours as needed. (Patient taking differently: Take 1,000 mg by mouth every 6 (six) hours as needed for mild pain or headache.) 30 tablet 0  . albuterol (PROVENTIL HFA;VENTOLIN HFA) 108 (90 Base) MCG/ACT inhaler Inhale 2 puffs into the lungs every 6 (six) hours as needed for wheezing. 1 Inhaler 6  . amLODipine (NORVASC) 5 MG tablet Take 5 mg by mouth daily.    . cetirizine (ZYRTEC) 10 MG tablet Take 10 mg by mouth daily.    . ergocalciferol (VITAMIN D2) 1.25 MG (50000 UT) capsule Take 1 capsule (50,000 Units total) by mouth once a week. (Patient taking differently: Take 50,000 Units by mouth every Wednesday.) 16 capsule 3  . estradiol (ESTRACE) 2 MG tablet Take 1 tablet (2 mg total) by mouth at bedtime. (Patient taking differently: Take 2 mg by mouth every evening.) 90  tablet 3  . fluticasone (FLOVENT HFA) 220 MCG/ACT inhaler Inhale 2 puffs into the lungs 2 (two) times daily. (Patient taking differently: Inhale 2 puffs into the lungs daily as needed (Shortness of breath).) 1 Inhaler 12  . gabapentin (NEURONTIN) 400 MG capsule Take 1 capsule (400 mg total) by mouth 4 (four) times daily. 40 capsule 2  . insulin glargine (LANTUS SOLOSTAR) 100 UNIT/ML Solostar Pen Use 34 units qhs. May titrate to 40 units qhs (Patient taking differently: Inject 35 Units into the skin at bedtime.) 15 mL 5  . lamoTRIgine (LAMICTAL) 100 MG tablet Take 100 mg by mouth 2 (two) times daily.     Marland Kitchen losartan (COZAAR) 100 MG tablet TAKE 1 TABLET BY MOUTH DAILY (Patient taking differently: Take 100 mg by mouth daily.) 90 tablet 0  . Menthol, Topical Analgesic, (BIOFREEZE COLORLESS) 4 % GEL Apply 1 application topically daily as needed (mucle pain).    . metFORMIN (GLUCOPHAGE) 500 MG tablet TAKE 1 TABLET(500 MG) BY MOUTH TWICE DAILY WITH A MEAL (Patient taking differently: Take 500 mg by mouth 2 (two) times daily with a meal.) 180 tablet 0  . methocarbamol (ROBAXIN) 750 MG tablet Take 1 tablet (750 mg total) by mouth 4 (four) times daily. 60 tablet 2  . oxyCODONE-acetaminophen (PERCOCET/ROXICET) 5-325 MG tablet Take 1 tablet by mouth every 4 (four) hours as needed for moderate pain.    . rosuvastatin (CRESTOR) 5 MG tablet TAKE 1 TABLET BY MOUTH EVERY DAY (Patient taking differently: Take 5 mg by mouth daily.) 30 tablet 0  . venlafaxine (EFFEXOR) 75 MG tablet Take 75 mg by mouth 2 (two) times daily with a meal.   1  . Insulin Pen Needle (PEN NEEDLES) 32G X 4 MM MISC 32 g by Does not apply route daily. 90 each 0    Drug Regimen Review { DRUG REGIMEN [id number]}  Home: Home Living Family/patient expects to be discharged to:: Private residence Living Arrangements: Spouse/significant other Available Help at Discharge: Family,Available PRN/intermittently Type of Home: House Home Access: Stairs  to enter [employer] of Steps: 3 Entrance Stairs-Rails: None Home Layout: One level Bathroom Shower/Tub: Multimedia programmer: Standard Bathroom Accessibility: Yes Home Equipment: Environmental consultant - 2 wheels,Walker - 4 wheels,Cane - single point,Grab bars - tub/shower,Bedside commode,Toilet riser Additional Comments: pt reports she will be home alone during  the day   Functional History: Prior Function Level of Independence: Needs assistance Gait / Transfers Assistance Needed: using RW to get to bathroom; lots of near falls; one fall since surgery when left leg went out; ADL's / Homemaking Assistance Needed: assist from family and friends Comments: about a month since tolerated sitting--mostly has been flat on her back; pain began in August and initially was using a cane to walk, having "flare ups" of pain and spasms, going to urgent cares until she had surgery  Functional Status:  Mobility: Bed Mobility Overal bed mobility: Needs Assistance Bed Mobility: Rolling,Sidelying to Sit,Sit to Sidelying Rolling: Min assist Sidelying to sit: Min assist Supine to sit: Mod assist Sit to supine: Mod assist Sit to sidelying: Mod assist General bed mobility comments: Multiple rolls working on technique and with intent to relax muscles, truncal assist up to EOB.  Pt able to scoot without assist and with use of UE's Transfers Overall transfer level: Needs assistance Equipment used: Rolling walker (2 wheeled) Transfers: Sit to/from Stand Sit to Stand: Mod assist,From elevated surface General transfer comment: Cues for hand placement, assist forward and for boost  x3 standing trials Ambulation/Gait Ambulation/Gait assistance: Mod assist,+2 safety/equipment,+2 physical assistance Gait Distance (Feet): 15 Feet (x2) Assistive device: Rolling walker (2 wheeled) Gait Pattern/deviations: Step-through pattern,Decreased stride length General Gait Details: Still unsteady, heavy reliance on the  RW,  abrupt LE buckle, softer initially, but progressively more sag with pain/fatigue.  +2 for assist with RW and safety. Gait velocity: slow Gait velocity interpretation: <1.31 ft/sec, indicative of household Engineer, mining mobility:  (began discussion of use of w/c if home alone. Pt thinks this could be done and be helpful. She agrees sitting is painful AND she wants to minimize fall risk.)  ADL: ADL Overall ADL's : Needs assistance/impaired Eating/Feeding: Modified independent,Bed level Eating/Feeding Details (indicate cue type and reason): pt reports inability to sit up to eat Lower Body Bathing: Total assistance Lower Body Dressing: Maximal assistance,Sit to/from stand Lower Body Dressing Details (indicate cue type and reason): don socks. demonstrates knee flexion but unable to cross Toilet Transfer: +2 for physical assistance,Maximal assistance Toilet Transfer Details (indicate cue type and reason): attempting to void bowels unsuccessful but able to void bladder Toileting- Clothing Manipulation and Hygiene: Total assistance Toileting - Clothing Manipulation Details (indicate cue type and reason): static standign with max (A) from PT and RW. OT completed hygiene. pt noted to have hemorroid at this time. pt reports not voiding for 1 week Functional mobility during ADLs: +2 for safety/equipment,Maximal assistance,Rolling walker General ADL Comments: pt motivated as she recognizes the need to void bowels. pt tearful at eob and reports anxiety that she will fall  Cognition: Cognition Overall Cognitive Status: Within Functional Limits for tasks assessed Orientation Level: Oriented X4 Cognition Arousal/Alertness: Awake/alert Behavior During Therapy: Anxious,WFL for tasks assessed/performed Overall Cognitive Status: Within Functional Limits for tasks assessed General Comments: anxious about falling  Physical Exam: Blood pressure 100/70, pulse 98, temperature  98.5 F (36.9 C), resp. rate 20, height 5\' 3"  (1.6 m), weight 90.3 kg, last menstrual period 07/10/2013, SpO2 95 %. Physical Exam  Results for orders placed or performed during the hospital encounter of 02/13/20 (from the past 48 hour(s))  Glucose, capillary     Status: Abnormal   Collection Time: 02/21/20  4:08 PM  Result Value Ref Range   Glucose-Capillary 109 (H) 70 - 99 mg/dL    Comment: Glucose reference range applies only to samples taken after fasting  for at least 8 hours.  Glucose, capillary     Status: Abnormal   Collection Time: 02/21/20  9:19 PM  Result Value Ref Range   Glucose-Capillary 144 (H) 70 - 99 mg/dL    Comment: Glucose reference range applies only to samples taken after fasting for at least 8 hours.  Glucose, capillary     Status: Abnormal   Collection Time: 02/22/20  7:40 AM  Result Value Ref Range   Glucose-Capillary 142 (H) 70 - 99 mg/dL    Comment: Glucose reference range applies only to samples taken after fasting for at least 8 hours.  Glucose, capillary     Status: Abnormal   Collection Time: 02/22/20 11:34 AM  Result Value Ref Range   Glucose-Capillary 143 (H) 70 - 99 mg/dL    Comment: Glucose reference range applies only to samples taken after fasting for at least 8 hours.   Comment 1 Notify RN    Comment 2 Document in Chart   Glucose, capillary     Status: Abnormal   Collection Time: 02/22/20  4:11 PM  Result Value Ref Range   Glucose-Capillary 132 (H) 70 - 99 mg/dL    Comment: Glucose reference range applies only to samples taken after fasting for at least 8 hours.  Glucose, capillary     Status: None   Collection Time: 02/22/20 10:30 PM  Result Value Ref Range   Glucose-Capillary 96 70 - 99 mg/dL    Comment: Glucose reference range applies only to samples taken after fasting for at least 8 hours.  Glucose, capillary     Status: Abnormal   Collection Time: 02/23/20  7:06 AM  Result Value Ref Range   Glucose-Capillary 145 (H) 70 - 99 mg/dL     Comment: Glucose reference range applies only to samples taken after fasting for at least 8 hours.  Glucose, capillary     Status: Abnormal   Collection Time: 02/23/20 12:17 PM  Result Value Ref Range   Glucose-Capillary 208 (H) 70 - 99 mg/dL    Comment: Glucose reference range applies only to samples taken after fasting for at least 8 hours.   Comment 1 Notify RN    Comment 2 Document in Chart    No results found.     Medical Problem List and Plan: 1.  *** secondary to ***  -patient may *** shower  -ELOS/Goals: *** 2.  Antithrombotics: -DVT/anticoagulation:  Pharmaceutical: Lovenox  -antiplatelet therapy: *** 3. Pain Management:   --on Baclofen tid for muscle spasms.  4. Mood: LCSW to follow for evaluation and support.   -antipsychotic agents: On Abilig 5. Neuropsych: This patient is capable of making decisions on her own behalf. 6. Skin/Wound Care: Routine pressure relief measures.  7. Fluids/Electrolytes/Nutrition: Monitor intake. Offer supplements between meals to help promote healing.  8. Neuropathy:  9. HTN: Monitor BP tid--continue Norvasc, Cozaar,  10. T2DM: Hgb A1c-6.4 and controlled. Will monitor BS ac/hs. Continue metformin bid with Lantus and use SSI for better control.    ***  Bary Leriche, PA-C 02/23/2020

## 2020-02-23 NOTE — Progress Notes (Signed)
Occupational Therapy Treatment Patient Details Name: Carrie Cook MRN: [id number] DOB: [birth year] Today's Date: 02/23/2020    History of present illness 60 y.o. female s/p right L3-4, L4-5 decompression 02/11/20 with postop improvement in right foot drop and radiculopathy but new severe contralateral radiculopathy with foot drop. Pt reports fall x 1 at home after surgery due to spasms. 02/14/20 Bilateral L3-4, L4-5 laminotomy, medial facetectomy for decompression of thecal sac and nerve roots including microdiscectomy at L4-5   OT comments  Pt progressing with bed mobility and BSC used during session. Pt total +2 Min (A) initially at start of session with elevated bed surface. Pt smiling and motivated by progress during this session. Pt agreeable to oob at end of PT session so chair left set up for patient. Recommendation CIR at this time.    Follow Up Recommendations  CIR    Equipment Recommendations  3 in 1 bedside commode;Other (comment)    Recommendations for Other Services Rehab consult    Precautions / Restrictions Precautions Precautions: Back;Fall Precaution Comments: pt with 1 fall at home after 1st surgery on 02/11/20       Mobility Bed Mobility Overal bed mobility: Needs Assistance Bed Mobility: Supine to Sit;Rolling Rolling: Min assist   Supine to sit: Min assist     General bed mobility comments: pt rolling R and L then R and then L before supine to sit with min (A). pt without pain during transfer compared to previous session. pt able to static sit at normal height without spasm  Transfers Overall transfer level: Needs assistance Equipment used: Rolling walker (2 wheeled) Transfers: Sit to/from Stand Sit to Stand: +2 physical assistance;Min assist         General transfer comment: first attempt from bed total +2 min (A) pt able to progress to Mod (A) with additional attempts during session.    Balance Overall balance assessment: Needs  assistance Sitting-balance support: Bilateral upper extremity supported;Feet supported Sitting balance-Leahy Scale: Fair     Standing balance support: Bilateral upper extremity supported;During functional activity Standing balance-Leahy Scale: Poor Standing balance comment: reliant on RW                           ADL either performed or assessed with clinical judgement   ADL Overall ADL's : Needs assistance/impaired     Grooming: Wash/dry hands;Set up;Sitting Grooming Details (indicate cue type and reason): wash cloth used                 Toilet Transfer: Moderate assistance;Ambulation;BSC;RW Toilet Transfer Details (indicate cue type and reason): cues for hand placement and anterior weight shift Toileting- Clothing Manipulation and Hygiene: Moderate assistance;Sitting/lateral lean Toileting - Clothing Manipulation Details (indicate cue type and reason): pt able to complete peri care with lateral lean     Functional mobility during ADLs: Minimal assistance;Rolling walker General ADL Comments: pt reports supine since Friday and not oob over weekend. pt eager to get OOB this session. pt encouraged to get staff to get her up daily even if with steady to decrease fall risk. pt reporting no males present. pt educated that female staff can help with DME if that makes her more confident     Vision       Perception     Praxis      Cognition Arousal/Alertness: Awake/alert Behavior During Therapy: WFL for tasks assessed/performed Overall Cognitive Status: Within Functional Limits for tasks assessed  Exercises     Shoulder Instructions       General Comments VSS pt very pleased with ability to move and expressed "i can't believe how tired and sore my butt is"    Pertinent Vitals/ Pain       Pain Assessment: Faces Faces Pain Scale: Hurts a little bit Pain Location: buttocks and foot Pain Descriptors  / Indicators: Sore  Home Living                                          Prior Functioning/Environment              Frequency  Min 2X/week        Progress Toward Goals  OT Goals(current goals can now be found in the care plan section)  Progress towards OT goals: Progressing toward goals  Acute Rehab OT Goals Patient Stated Goal: to get up more OT Goal Formulation: With patient Time For Goal Achievement: 03/01/20 Potential to Achieve Goals: Good ADL Goals Pt Will Perform Lower Body Dressing: with min assist;with adaptive equipment;sit to/from stand Pt Will Transfer to Toilet: with min assist;stand pivot transfer;bedside commode Additional ADL Goal #1: pt will complete bed mobility supervision level as precursor to adls. Additional ADL Goal #2: pt will complete basic transfer with RW mod (A) as precursor toa dls.  Plan Discharge plan remains appropriate    Co-evaluation    PT/OT/SLP Co-Evaluation/Treatment: Yes     OT goals addressed during session: ADL's and self-care;Proper use of Adaptive equipment and DME      AM-PAC OT "6 Clicks" Daily Activity     Outcome Measure   Help from another person eating meals?: None Help from another person taking care of personal grooming?: A Little Help from another person toileting, which includes using toliet, bedpan, or urinal?: A Lot Help from another person bathing (including washing, rinsing, drying)?: A Lot Help from another person to put on and taking off regular upper body clothing?: A Little Help from another person to put on and taking off regular lower body clothing?: A Lot 6 Click Score: 16    End of Session Equipment Utilized During Treatment: Rolling walker  OT Visit Diagnosis: Unsteadiness on feet (R26.81);Muscle weakness (generalized) (M62.81);Pain   Activity Tolerance Patient tolerated treatment well   Patient Left Other (comment) (up in room with PT)   Nurse Communication Mobility  status;Precautions        Time: 2751-7001 OT Time Calculation (min): 21 min  Charges: OT General Charges $OT Visit: 1 Visit OT Treatments $Self Care/Home Management : 8-22 mins   Brynn, OTR/L  Acute Rehabilitation Services Pager: [phone] Office: [phone] .    Jeri Modena 02/23/2020, 3:57 PM

## 2020-02-23 NOTE — Progress Notes (Signed)
Neurosurgery Service Progress Note  Subjective: No acute events overnight, had BM yesterday  Objective: Vitals:   02/22/20 2305 02/23/20 0350 02/23/20 0700 02/23/20 1221  BP: 125/81 120/80 114/82 100/70  Pulse: 78 82  98  Resp: 16 17 20 20   Temp: 98.7 F (37.1 C) 98.5 F (36.9 C) 98.4 F (36.9 C) 98.5 F (36.9 C)  TempSrc: Oral Oral Oral   SpO2: 96% 96% 95% 95%  Weight:      Height:        Physical Exam: AOx3, PERRL, EOMI, FS, TM, Strength 5/5 except 4- to 4/5 in L EHL/DF Incision c/d/i  Assessment & Plan: 60 y.o. woman s/p MIS L L3-4/4-5 microdisc with resolution of preop symptoms and post-op delayed new right / contralateral weakness with worsening of canal stenosis s/p open L3-4 / 4-5 decompression.  -d/c respiratory precautions -sig muscle spasms persist, they have been unresponsive to diazepam, cyclobenzaprine, tizanidine, methocarbamol, improvement w/ baclofen, cont baclofen -SCDs/TEDs, SQH -awaiting CIR bed  Judith Part  02/23/20 2:38 PM

## 2020-02-23 NOTE — Progress Notes (Signed)
Inpatient Rehabilitation Admissions Coordinator  CIR bed not available to admit this patient today.  Danne Baxter, RN, MSN Rehab Admissions Coordinator [phone] 02/23/2020 1:11 PM

## 2020-02-24 ENCOUNTER — Encounter (HOSPITAL_COMMUNITY): Payer: Self-pay | Admitting: Physical Medicine & Rehabilitation

## 2020-02-24 ENCOUNTER — Inpatient Hospital Stay (HOSPITAL_COMMUNITY)
Admission: RE | Admit: 2020-02-24 | Discharge: 2020-03-06 | DRG: 949 | Disposition: A | Discharge status: Home / Self Care | Payer: Medicare Other | Source: Intra-hospital | Attending: Physical Medicine & Rehabilitation | Admitting: Physical Medicine & Rehabilitation

## 2020-02-24 ENCOUNTER — Other Ambulatory Visit: Payer: Self-pay

## 2020-02-24 DIAGNOSIS — R270 Ataxia, unspecified: Secondary | ICD-10-CM | POA: Diagnosis present

## 2020-02-24 DIAGNOSIS — E1165 Type 2 diabetes mellitus with hyperglycemia: Secondary | ICD-10-CM | POA: Diagnosis not present

## 2020-02-24 DIAGNOSIS — G8918 Other acute postprocedural pain: Secondary | ICD-10-CM | POA: Diagnosis not present

## 2020-02-24 DIAGNOSIS — Z8349 Family history of other endocrine, nutritional and metabolic diseases: Secondary | ICD-10-CM | POA: Diagnosis not present

## 2020-02-24 DIAGNOSIS — K5903 Drug induced constipation: Secondary | ICD-10-CM | POA: Diagnosis not present

## 2020-02-24 DIAGNOSIS — R200 Anesthesia of skin: Secondary | ICD-10-CM | POA: Diagnosis not present

## 2020-02-24 DIAGNOSIS — M21372 Foot drop, left foot: Secondary | ICD-10-CM | POA: Diagnosis present

## 2020-02-24 DIAGNOSIS — Z9884 Bariatric surgery status: Secondary | ICD-10-CM

## 2020-02-24 DIAGNOSIS — Z79899 Other long term (current) drug therapy: Secondary | ICD-10-CM

## 2020-02-24 DIAGNOSIS — R079 Chest pain, unspecified: Secondary | ICD-10-CM | POA: Diagnosis not present

## 2020-02-24 DIAGNOSIS — Z833 Family history of diabetes mellitus: Secondary | ICD-10-CM | POA: Diagnosis not present

## 2020-02-24 DIAGNOSIS — E46 Unspecified protein-calorie malnutrition: Secondary | ICD-10-CM

## 2020-02-24 DIAGNOSIS — I10 Essential (primary) hypertension: Secondary | ICD-10-CM | POA: Diagnosis present

## 2020-02-24 DIAGNOSIS — R110 Nausea: Secondary | ICD-10-CM | POA: Diagnosis present

## 2020-02-24 DIAGNOSIS — Z6832 Body mass index (BMI) 32.0-32.9, adult: Secondary | ICD-10-CM

## 2020-02-24 DIAGNOSIS — R0789 Other chest pain: Secondary | ICD-10-CM | POA: Diagnosis not present

## 2020-02-24 DIAGNOSIS — Z90722 Acquired absence of ovaries, bilateral: Secondary | ICD-10-CM | POA: Diagnosis not present

## 2020-02-24 DIAGNOSIS — E8809 Other disorders of plasma-protein metabolism, not elsewhere classified: Secondary | ICD-10-CM | POA: Diagnosis not present

## 2020-02-24 DIAGNOSIS — Z7984 Long term (current) use of oral hypoglycemic drugs: Secondary | ICD-10-CM

## 2020-02-24 DIAGNOSIS — R339 Retention of urine, unspecified: Secondary | ICD-10-CM | POA: Diagnosis present

## 2020-02-24 DIAGNOSIS — G959 Disease of spinal cord, unspecified: Secondary | ICD-10-CM | POA: Diagnosis present

## 2020-02-24 DIAGNOSIS — E785 Hyperlipidemia, unspecified: Secondary | ICD-10-CM | POA: Diagnosis present

## 2020-02-24 DIAGNOSIS — Z96651 Presence of right artificial knee joint: Secondary | ICD-10-CM | POA: Diagnosis present

## 2020-02-24 DIAGNOSIS — F32A Depression, unspecified: Secondary | ICD-10-CM | POA: Diagnosis not present

## 2020-02-24 DIAGNOSIS — J45909 Unspecified asthma, uncomplicated: Secondary | ICD-10-CM | POA: Diagnosis present

## 2020-02-24 DIAGNOSIS — M533 Sacrococcygeal disorders, not elsewhere classified: Secondary | ICD-10-CM | POA: Diagnosis present

## 2020-02-24 DIAGNOSIS — M5416 Radiculopathy, lumbar region: Secondary | ICD-10-CM | POA: Diagnosis not present

## 2020-02-24 DIAGNOSIS — M48061 Spinal stenosis, lumbar region without neurogenic claudication: Secondary | ICD-10-CM | POA: Diagnosis present

## 2020-02-24 DIAGNOSIS — Z83438 Family history of other disorder of lipoprotein metabolism and other lipidemia: Secondary | ICD-10-CM | POA: Diagnosis not present

## 2020-02-24 DIAGNOSIS — Z794 Long term (current) use of insulin: Secondary | ICD-10-CM

## 2020-02-24 DIAGNOSIS — Z825 Family history of asthma and other chronic lower respiratory diseases: Secondary | ICD-10-CM

## 2020-02-24 DIAGNOSIS — Z9114 Patient's other noncompliance with medication regimen: Secondary | ICD-10-CM

## 2020-02-24 DIAGNOSIS — Z48811 Encounter for surgical aftercare following surgery on the nervous system: Secondary | ICD-10-CM | POA: Diagnosis not present

## 2020-02-24 DIAGNOSIS — D72829 Elevated white blood cell count, unspecified: Secondary | ICD-10-CM | POA: Diagnosis present

## 2020-02-24 DIAGNOSIS — R531 Weakness: Secondary | ICD-10-CM | POA: Diagnosis not present

## 2020-02-24 DIAGNOSIS — Z8249 Family history of ischemic heart disease and other diseases of the circulatory system: Secondary | ICD-10-CM | POA: Diagnosis not present

## 2020-02-24 DIAGNOSIS — Z8616 Personal history of COVID-19: Secondary | ICD-10-CM | POA: Diagnosis not present

## 2020-02-24 DIAGNOSIS — M62838 Other muscle spasm: Secondary | ICD-10-CM | POA: Diagnosis present

## 2020-02-24 DIAGNOSIS — E441 Mild protein-calorie malnutrition: Secondary | ICD-10-CM | POA: Diagnosis not present

## 2020-02-24 DIAGNOSIS — M5106 Intervertebral disc disorders with myelopathy, lumbar region: Secondary | ICD-10-CM | POA: Diagnosis not present

## 2020-02-24 DIAGNOSIS — M1990 Unspecified osteoarthritis, unspecified site: Secondary | ICD-10-CM | POA: Diagnosis not present

## 2020-02-24 DIAGNOSIS — G992 Myelopathy in diseases classified elsewhere: Secondary | ICD-10-CM

## 2020-02-24 DIAGNOSIS — Z882 Allergy status to sulfonamides status: Secondary | ICD-10-CM

## 2020-02-24 DIAGNOSIS — R7309 Other abnormal glucose: Secondary | ICD-10-CM

## 2020-02-24 DIAGNOSIS — D649 Anemia, unspecified: Secondary | ICD-10-CM | POA: Diagnosis present

## 2020-02-24 DIAGNOSIS — F419 Anxiety disorder, unspecified: Secondary | ICD-10-CM | POA: Diagnosis present

## 2020-02-24 DIAGNOSIS — M5116 Intervertebral disc disorders with radiculopathy, lumbar region: Secondary | ICD-10-CM

## 2020-02-24 DIAGNOSIS — Z90710 Acquired absence of both cervix and uterus: Secondary | ICD-10-CM

## 2020-02-24 DIAGNOSIS — E1140 Type 2 diabetes mellitus with diabetic neuropathy, unspecified: Secondary | ICD-10-CM | POA: Diagnosis present

## 2020-02-24 DIAGNOSIS — M170 Bilateral primary osteoarthritis of knee: Secondary | ICD-10-CM | POA: Diagnosis present

## 2020-02-24 DIAGNOSIS — Z888 Allergy status to other drugs, medicaments and biological substances status: Secondary | ICD-10-CM

## 2020-02-24 DIAGNOSIS — Z87442 Personal history of urinary calculi: Secondary | ICD-10-CM

## 2020-02-24 LAB — GLUCOSE, CAPILLARY
Glucose-Capillary: 153 mg/dL — ABNORMAL HIGH (ref 70–99)
Glucose-Capillary: 110 mg/dL — ABNORMAL HIGH (ref 70–99)
Glucose-Capillary: 114 mg/dL — ABNORMAL HIGH (ref 70–99)
Glucose-Capillary: 111 mg/dL — ABNORMAL HIGH (ref 70–99)

## 2020-02-24 MED ORDER — GERHARDT'S BUTT CREAM: TOPICAL_CREAM | Freq: Two times a day (BID) | CUTANEOUS | Status: DC

## 2020-02-24 MED ORDER — PROCHLORPERAZINE MALEATE 5 MG PO TABS
5.0000 mg | ORAL_TABLET | Freq: Four times a day (QID) | ORAL | Status: DC | PRN
  Administered 2020-02-24: 10 mg via ORAL
  Administered 2020-02-25: 5 mg via ORAL
  Filled 2020-02-24: qty 2
  Filled 2020-02-24: qty 1
  Filled 2020-02-24: qty 2

## 2020-02-24 MED ORDER — INSULIN ASPART 100 UNIT/ML ~~LOC~~ SOLN
0.0000 [IU] | Freq: Three times a day (TID) | SUBCUTANEOUS | Status: DC
  Administered 2020-02-25: 1 [IU] via SUBCUTANEOUS
  Administered 2020-02-25 (×2): 2 [IU] via SUBCUTANEOUS
  Administered 2020-02-26 (×3): 1 [IU] via SUBCUTANEOUS
  Administered 2020-02-27: 2 [IU] via SUBCUTANEOUS
  Administered 2020-02-27 – 2020-02-29 (×4): 1 [IU] via SUBCUTANEOUS
  Administered 2020-03-01: 2 [IU] via SUBCUTANEOUS
  Administered 2020-03-01 – 2020-03-03 (×5): 1 [IU] via SUBCUTANEOUS
  Administered 2020-03-04: 2 [IU] via SUBCUTANEOUS
  Administered 2020-03-04 – 2020-03-06 (×4): 1 [IU] via SUBCUTANEOUS

## 2020-02-24 MED ORDER — ALUM & MAG HYDROXIDE-SIMETH 200-200-20 MG/5ML PO SUSP: 30.0000 mL | ORAL | Status: DC | PRN

## 2020-02-24 MED ORDER — POLYETHYLENE GLYCOL 3350 17 G PO PACK
17.0000 g | PACK | Freq: Every day | ORAL | Status: DC
  Filled 2020-02-24 (×3): qty 1

## 2020-02-24 MED ORDER — GUAIFENESIN-DM 100-10 MG/5ML PO SYRP: 5.0000 mL | ORAL_SOLUTION | Freq: Four times a day (QID) | ORAL | Status: DC | PRN

## 2020-02-24 MED ORDER — LOSARTAN POTASSIUM 50 MG PO TABS
100.0000 mg | ORAL_TABLET | Freq: Every day | ORAL | Status: DC
  Administered 2020-02-25 – 2020-03-06 (×11): 100 mg via ORAL
  Filled 2020-02-24 (×13): qty 2

## 2020-02-24 MED ORDER — CLONIDINE HCL 0.1 MG PO TABS
0.1000 mg | ORAL_TABLET | Freq: Every day | ORAL | Status: DC
  Administered 2020-02-25 – 2020-03-05 (×9): 0.1 mg via ORAL
  Filled 2020-02-24 (×11): qty 1

## 2020-02-24 MED ORDER — OXYCODONE HCL 5 MG PO TABS
5.0000 mg | ORAL_TABLET | ORAL | Status: DC | PRN
  Administered 2020-02-24 – 2020-03-01 (×6): 10 mg via ORAL
  Filled 2020-02-24 (×6): qty 2

## 2020-02-24 MED ORDER — BACLOFEN 10 MG PO TABS
10.0000 mg | ORAL_TABLET | Freq: Three times a day (TID) | ORAL | Status: DC
  Administered 2020-02-24 – 2020-03-06 (×32): 10 mg via ORAL
  Filled 2020-02-24 (×32): qty 1

## 2020-02-24 MED ORDER — BUDESONIDE 0.5 MG/2ML IN SUSP: 0.2500 mg | Freq: Two times a day (BID) | RESPIRATORY_TRACT | Status: DC | PRN

## 2020-02-24 MED ORDER — ROSUVASTATIN CALCIUM 5 MG PO TABS
5.0000 mg | ORAL_TABLET | Freq: Every day | ORAL | Status: DC
  Administered 2020-02-25 – 2020-03-06 (×11): 5 mg via ORAL
  Filled 2020-02-24 (×11): qty 1

## 2020-02-24 MED ORDER — BISACODYL 10 MG RE SUPP: 10.0000 mg | Freq: Every day | RECTAL | Status: DC | PRN

## 2020-02-24 MED ORDER — ADULT MULTIVITAMIN W/MINERALS CH
1.0000 | ORAL_TABLET | Freq: Every day | ORAL | Status: DC
  Administered 2020-02-25 – 2020-03-06 (×11): 1 via ORAL
  Filled 2020-02-24 (×11): qty 1

## 2020-02-24 MED ORDER — PROCHLORPERAZINE 25 MG RE SUPP: 12.5000 mg | Freq: Four times a day (QID) | RECTAL | Status: DC | PRN

## 2020-02-24 MED ORDER — AMLODIPINE BESYLATE 2.5 MG PO TABS
2.5000 mg | ORAL_TABLET | Freq: Every day | ORAL | Status: DC
  Administered 2020-02-25 – 2020-03-06 (×11): 2.5 mg via ORAL
  Filled 2020-02-24 (×11): qty 1

## 2020-02-24 MED ORDER — ACETAMINOPHEN 325 MG PO TABS
325.0000 mg | ORAL_TABLET | ORAL | Status: DC | PRN
  Administered 2020-02-27 – 2020-02-29 (×2): 650 mg via ORAL
  Filled 2020-02-24 (×3): qty 2

## 2020-02-24 MED ORDER — LAMOTRIGINE 100 MG PO TABS
100.0000 mg | ORAL_TABLET | Freq: Two times a day (BID) | ORAL | Status: DC
  Administered 2020-02-24 – 2020-03-06 (×22): 100 mg via ORAL
  Filled 2020-02-24 (×23): qty 1

## 2020-02-24 MED ORDER — SENNOSIDES-DOCUSATE SODIUM 8.6-50 MG PO TABS
2.0000 | ORAL_TABLET | Freq: Every day | ORAL | Status: DC
  Administered 2020-02-24 – 2020-02-25 (×2): 2 via ORAL
  Filled 2020-02-24 (×2): qty 2

## 2020-02-24 MED ORDER — TRAZODONE HCL 50 MG PO TABS: 25.0000 mg | ORAL_TABLET | Freq: Every evening | ORAL | Status: DC | PRN

## 2020-02-24 MED ORDER — PROSOURCE PLUS PO LIQD
30.0000 mL | Freq: Two times a day (BID) | ORAL | Status: DC
  Administered 2020-02-25 – 2020-03-05 (×20): 30 mL via ORAL
  Filled 2020-02-24 (×20): qty 30

## 2020-02-24 MED ORDER — POLYETHYLENE GLYCOL 3350 17 G PO PACK
17.0000 g | PACK | Freq: Every day | ORAL | Status: DC | PRN
Start: 2020-02-24 — End: 2020-03-06

## 2020-02-24 MED ORDER — FLEET ENEMA 7-19 GM/118ML RE ENEM: 1.0000 | ENEMA | Freq: Once | RECTAL | Status: DC | PRN

## 2020-02-24 MED ORDER — PROCHLORPERAZINE EDISYLATE 10 MG/2ML IJ SOLN
5.0000 mg | Freq: Four times a day (QID) | INTRAMUSCULAR | Status: DC | PRN
  Filled 2020-02-24: qty 2

## 2020-02-24 MED ORDER — ENOXAPARIN SODIUM 40 MG/0.4ML ~~LOC~~ SOLN
40.0000 mg | SUBCUTANEOUS | Status: DC
  Administered 2020-02-24 – 2020-03-05 (×11): 40 mg via SUBCUTANEOUS
  Filled 2020-02-24 (×12): qty 0.4

## 2020-02-24 MED ORDER — BACLOFEN 10 MG PO TABS: 10.0000 mg | ORAL_TABLET | Freq: Three times a day (TID) | ORAL | 2 refills | Status: DC

## 2020-02-24 MED ORDER — LORATADINE 10 MG PO TABS
10.0000 mg | ORAL_TABLET | Freq: Every day | ORAL | Status: DC
  Administered 2020-02-25 – 2020-03-06 (×11): 10 mg via ORAL
  Filled 2020-02-24 (×11): qty 1

## 2020-02-24 MED ORDER — DIPHENHYDRAMINE HCL 12.5 MG/5ML PO ELIX: 12.5000 mg | ORAL_SOLUTION | Freq: Four times a day (QID) | ORAL | Status: DC | PRN

## 2020-02-24 MED ORDER — BACLOFEN 5 MG HALF TABLET
5.0000 mg | ORAL_TABLET | Freq: Three times a day (TID) | ORAL | Status: DC | PRN
  Filled 2020-02-24: qty 1

## 2020-02-24 MED ORDER — METFORMIN HCL 500 MG PO TABS
500.0000 mg | ORAL_TABLET | Freq: Two times a day (BID) | ORAL | Status: DC
  Administered 2020-02-24 – 2020-03-02 (×14): 500 mg via ORAL
  Filled 2020-02-24 (×14): qty 1

## 2020-02-24 MED ORDER — ALBUTEROL SULFATE HFA 108 (90 BASE) MCG/ACT IN AERS
2.0000 | INHALATION_SPRAY | Freq: Four times a day (QID) | RESPIRATORY_TRACT | Status: DC | PRN
  Filled 2020-02-24: qty 6.7

## 2020-02-24 MED ORDER — PHENOL 1.4 % MT LIQD
1.0000 | OROMUCOSAL | Status: DC | PRN
Start: 2020-02-24 — End: 2020-03-06
  Filled 2020-02-24: qty 177

## 2020-02-24 MED ORDER — INSULIN ASPART 100 UNIT/ML ~~LOC~~ SOLN: 0.0000 [IU] | Freq: Every day | SUBCUTANEOUS | Status: DC

## 2020-02-24 MED ORDER — VENLAFAXINE HCL 75 MG PO TABS
75.0000 mg | ORAL_TABLET | Freq: Two times a day (BID) | ORAL | Status: DC
  Administered 2020-02-24 – 2020-03-06 (×22): 75 mg via ORAL
  Filled 2020-02-24 (×22): qty 1

## 2020-02-24 MED ORDER — MENTHOL 3 MG MT LOZG
1.0000 | LOZENGE | OROMUCOSAL | Status: DC | PRN
Start: 2020-02-24 — End: 2020-03-06

## 2020-02-24 MED ORDER — ESTRADIOL 1 MG PO TABS
2.0000 mg | ORAL_TABLET | Freq: Every evening | ORAL | Status: DC
  Administered 2020-02-24 – 2020-03-05 (×11): 2 mg via ORAL
  Filled 2020-02-24 (×4): qty 2
  Filled 2020-02-24: qty 1
  Filled 2020-02-24 (×8): qty 2

## 2020-02-24 MED ORDER — INSULIN GLARGINE 100 UNIT/ML ~~LOC~~ SOLN
5.0000 [IU] | Freq: Every day | SUBCUTANEOUS | Status: DC
  Administered 2020-02-24 – 2020-03-05 (×11): 5 [IU] via SUBCUTANEOUS
  Filled 2020-02-24 (×12): qty 0.05

## 2020-02-24 MED ORDER — GABAPENTIN 400 MG PO CAPS
400.0000 mg | ORAL_CAPSULE | Freq: Four times a day (QID) | ORAL | Status: DC
  Administered 2020-02-24 – 2020-03-06 (×42): 400 mg via ORAL
  Filled 2020-02-24 (×42): qty 1

## 2020-02-24 NOTE — H&P (Signed)
Physical Medicine and Rehabilitation Admission H&P    Chief Complaint  Patient presents with  . Functional deficits due to BLE weakness s/p lumbar decompression.     HPI: Carrie Cook is a 60 year old female with history of HTN, T2DM, R-TKR (done 6/21) 06/2019, gastric bypass, Vit D deficiency, depression/anxiety, RLE pain with work up revealing moderate to severe spinal stenosis L3-4. She underwent L3/4 and L4/5 decompression on 01/19 at surgical center by Dr. Maurice Small. She was readmitted 01/21 due to new onset of left foot drop, severe muscle spasms, severe pain and fall. Marland Kitchen MRI spine done revealing severe spinal canal narrowing at L3-L5 levels with increased conspicuity of epidural fat--question postprocedural edema contributing to stenosis. She underwent bilateral L3/4 and L4/5 facetectomy with decompression of thecal nerve roots on 01/22.  Post op, neuropathy and LLE weakness improving but has had severe muscle spasms requiring multiple medication adjustment. She was also found to be Covid + but has been asymptomatic and has been on isolation X 10 days.    Spasms have resolved (last this Sunday) with addition of baclofen and finally had BM after 10 days. Intake good. She continues to have cough as well as nausea with activity. Therapy has been ongoing and patient limited by BLE weakness with ataxia and sensory deficits, balance deficits with anterior lean as well as reports of nausea with OOBactivity OOB/dizziness with upward gaze. CIR Recommended due to functional decline.   Pt reports spasms have been better controlled with Baclofen, but concerned that might need more Baclofen, because "on the edge of spasms" still.  Most of pain has been spasms.  Felt like was retaining urine when at home up until 1st surgery and afterwards, but since 2nd surgery, is better- not resolved.  LBM Sunday- very large BM- usually goes 3-4 days between BM's- of note went 12 days before [facility] Sunday.    Admitted has been nauseated last 3 days and dizzy- IV meds for nausea have helped.  BGs at home 200-300 maybe more- 200 in AM- better with diet here.   Review of Systems  Constitutional: Negative for chills, fever and malaise/fatigue.  HENT: Negative for hearing loss and tinnitus.   Eyes: Negative for blurred vision, double vision and discharge.  Respiratory: Positive for cough (since surgery). Negative for shortness of breath.   Cardiovascular: Negative for chest pain, palpitations and leg swelling.  Gastrointestinal: Positive for nausea (in am ). Negative for constipation and heartburn.  Genitourinary: Negative for dysuria and urgency.       Used to get up 4-5 times at night.   Musculoskeletal: Positive for joint pain (right hip pain better. ) and myalgias (spasms across buttocks and down both legs).  Skin: Negative for itching and rash.  Neurological: Positive for dizziness (with upward gaze), sensory change (Bilateral hands due to carpel tunnerl. Bilateral feet numbness for years. Now has numbness and tingling now in left foot. ) and weakness.  All other systems reviewed and are negative.    Past Medical History:  Diagnosis Date  . Anemia    PT HAS HAD COLONOSCOPY AND ENDOSCOPY WORK UP - NO PROBLEMS FOUND AS SOURCE OF ANEMIA -- PT HAD IRON INFUSION AT [facility] 11/13/12-AFTER SEEING HEMATOLOGIST DR. FORMANEK AND HE GAVE HEMATOLOGIC CLEARANCE FOR GASTRIC BYPASS SURGERY.  Marland Kitchen Anxiety   . Asthma    daily and prn inhalers  . Degenerative joint disease    right knee, spine - STATES INTERMITTENT  NUMBNESS DOWN RT LEG  WITH PROLONGED STANDING OR WALKING- THINKS RELATED TO HER SPINE PROBLEMS  . Dental crowns present   . Depression   . Family history of anesthesia complication    pt's mother and sister have hx. of post-op N/V  . Fibroids    UTERINE  . Gastroesophageal reflux disease    none for 2 years since Bariatric surgery.  . H/O hiatal hernia   . History of endometriosis   .  History of kidney stones   . Hyperlipidemia   . Hypertension    under control with med., has been on med. x 2 yr.  . Insulin dependent diabetes mellitus   . Lock jaw    jaw locks open if opens mouth wide  . Migraines   . Neuropathy    FEET  . Obesity   . Palpitations   . PONV (postoperative nausea and vomiting)   . Shortness of breath    with daily activities  . Sleep apnea    post-op didn't need CPAP but now states needs it again but not using    Past Surgical History:  Procedure Laterality Date  . ABDOMINAL HYSTERECTOMY N/A 06/06/2017   Procedure: HYSTERECTOMY ABDOMINAL;  Surgeon: Florian Buff, MD;  Location: [facility];  Service: Gynecology;  Laterality: N/A;  . BACK SURGERY    . BREATH TEK H PYLORI N/A 08/13/2012   Procedure: BREATH TEK H PYLORI;  Surgeon: Edward Jolly, MD;  Location: [facility];  Service: General;  Laterality: N/A;  . CARPAL TUNNEL RELEASE  01/03/2012   Procedure: CARPAL TUNNEL RELEASE;  Surgeon: Wynonia Sours, MD;  Location: [place];  Service: Orthopedics;  Laterality: Right;  . carpal tunnel release right hand Right 12/13  . COLONOSCOPY  05/2009   IEP:PIRJJOAC hemorrhoids otherwise normal colon, rectum and terminal ileum  . COLONOSCOPY WITH ESOPHAGOGASTRODUODENOSCOPY (EGD) N/A 10/28/2012   Procedure: COLONOSCOPY WITH ESOPHAGOGASTRODUODENOSCOPY (EGD);  Surgeon: Daneil Dolin, MD;  Location: [facility];  Service: Endoscopy;  Laterality: N/A;  7:30  . DILITATION & CURRETTAGE/HYSTROSCOPY WITH NOVASURE ABLATION N/A 07/22/2014   Procedure: DILATATION & CURETTAGE/HYSTEROSCOPY WITH NOVASURE ABLATION; uterine length 6.0 cm; uterine width 3.8 cm; total ablation time 1 minute 15 seconds;  Surgeon: Florian Buff, MD;  Location: [facility];  Service: Gynecology;  Laterality: N/A;  . ESOPHAGOGASTRODUODENOSCOPY  5/013/2011   ZYS:AYTKZS esophagus/multiple polyps removed s/p (hyperplastic). Gastritis without H.pylori.  . ESOPHAGOGASTRODUODENOSCOPY (EGD)  WITH PROPOFOL N/A 06/06/2018   with LA Grade A esophagitis s/p dilation.   Marland Kitchen GASTRIC ROUX-EN-Y N/A 11/19/2012   Procedure: LAPAROSCOPIC ROUX-EN-Y GASTRIC;  Surgeon: Edward Jolly, MD;  Location: [facility];  Service: General;  Laterality: N/A;  . GIVENS CAPSULE STUDY N/A 10/28/2012   Procedure: GIVENS CAPSULE STUDY;  Surgeon: Daneil Dolin, MD;  [facility];  Service: Endoscopy;  Laterality: N/A;  . KNEE ARTHROSCOPY WITH MEDIAL MENISECTOMY Right 10/28/2015   Procedure: RIGHT KNEE ARTHROSCOPY WITH MEDIAL MENISECTOMY;  Surgeon: Carole Civil, MD;  Location: [facility];  Service: Orthopedics;  Laterality: Right;  . LAPAROSCOPY     due to endometriosis  . LUMBAR LAMINECTOMY/DECOMPRESSION MICRODISCECTOMY N/A 02/14/2020   Procedure: LUMBAR LAMINECTOMY  Lumbar three-four, Lumbar four-five;  Surgeon: Consuella Lose, MD;  Location: [place];  Service: Neurosurgery;  Laterality: N/A;  . Venia Minks DILATION N/A 06/06/2018   Procedure: Venia Minks DILATION;  Surgeon: Daneil Dolin, MD;  [facility];  Service: Endoscopy;  Laterality: N/A;  . PELVIC LAPAROSCOPY  11/04/1999  with fulguration of endometriosis  . SALPINGOOPHORECTOMY Bilateral 06/06/2017   Procedure: SALPINGO OOPHORECTOMY;  Surgeon: Florian Buff, MD;  Location: [facility];  Service: Gynecology;  Laterality: Bilateral;  . TOTAL KNEE ARTHROPLASTY Right 07/22/2019   Procedure: TOTAL KNEE ARTHROPLASTY;  Surgeon: Carole Civil, MD;  Location: [facility];  Service: Orthopedics;  Laterality: Right;  . TRIGGER FINGER RELEASE Right 09/24/2012   Procedure: RELEASE A-1 PULLEY OF RIGHT THUMB;  Surgeon: Wynonia Sours, MD;  Location: [place];  Service: Orthopedics;  Laterality: Right;  . TUBAL LIGATION  1993    Family History  Problem Relation Age of Onset  . Hypertension Father   . Hyperlipidemia Father   . COPD Father   . COPD Mother   . Heart disease Mother   . Cancer Mother        Cervix  . Anesthesia problems  Mother        post-op N/V  . Thyroid disease Mother   . Diabetes Maternal Grandfather   . Thyroid disease Sister   . Anesthesia problems Sister        post-op N/V  . Thyroid disease Paternal Uncle   . Diabetes Other   . Colon cancer Neg Hx   . Colon polyps Neg Hx     Social History:  Married. Used to work as a Careers information officer. Has been disabled since 2012 due to DM/depression/arthritis. She reports that she has never smoked. She has never used smokeless tobacco. She reports that she does not drink alcohol and does not use drugs.   Allergies  Allergen Reactions  . Qvar [Beclomethasone] Shortness Of Breath and Other (See Comments)    Patient states that this medication causes her to feel short of breath  . Sulfa Antibiotics Hives and Rash  . Tizanidine Shortness Of Breath, Itching and Rash  . Erythromycin Ethylsuccinate Hives and Other (See Comments)    nausea  . Prozac [Fluoxetine Hcl] Hives  . Ace Inhibitors Cough  . Hydrochlorothiazide Cough  . Lipitor [Atorvastatin Calcium] Other (See Comments)    Body aches, flu-like symptoms  . Dyazide [Hydrochlorothiazide W-Triamterene] Cough  . Erythromycin Hives and Other (See Comments)    nausea  . Lisinopril Cough  . Pravastatin Other (See Comments)    BODY ACHES, FLU-LIKE SX.  . Sulfonamide Derivatives Hives and Rash    Medications Prior to Admission  Medication Sig Dispense Refill  . acetaminophen (TYLENOL) 500 MG tablet Take 1 tablet (500 mg total) by mouth every 6 (six) hours as needed. (Patient taking differently: Take 1,000 mg by mouth every 6 (six) hours as needed for mild pain or headache.) 30 tablet 0  . albuterol (PROVENTIL HFA;VENTOLIN HFA) 108 (90 Base) MCG/ACT inhaler Inhale 2 puffs into the lungs every 6 (six) hours as needed for wheezing. 1 Inhaler 6  . amLODipine (NORVASC) 5 MG tablet Take 5 mg by mouth daily.    . baclofen (LIORESAL) 10 MG tablet Take 1 tablet (10 mg total) by mouth 3 (three) times daily. 30 each 2   . cetirizine (ZYRTEC) 10 MG tablet Take 10 mg by mouth daily.    . ergocalciferol (VITAMIN D2) 1.25 MG (50000 UT) capsule Take 1 capsule (50,000 Units total) by mouth once a week. (Patient taking differently: Take 50,000 Units by mouth every Wednesday.) 16 capsule 3  . estradiol (ESTRACE) 2 MG tablet Take 1 tablet (2 mg total) by mouth at bedtime. (Patient taking differently: Take 2 mg by mouth every evening.) 90  tablet 3  . fluticasone (FLOVENT HFA) 220 MCG/ACT inhaler Inhale 2 puffs into the lungs 2 (two) times daily. (Patient taking differently: Inhale 2 puffs into the lungs daily as needed (Shortness of breath).) 1 Inhaler 12  . gabapentin (NEURONTIN) 400 MG capsule Take 1 capsule (400 mg total) by mouth 4 (four) times daily. 40 capsule 2  . insulin glargine (LANTUS SOLOSTAR) 100 UNIT/ML Solostar Pen Use 34 units qhs. May titrate to 40 units qhs (Patient taking differently: Inject 35 Units into the skin at bedtime.) 15 mL 5  . Insulin Pen Needle (PEN NEEDLES) 32G X 4 MM MISC 32 g by Does not apply route daily. 90 each 0  . lamoTRIgine (LAMICTAL) 100 MG tablet Take 100 mg by mouth 2 (two) times daily.     Marland Kitchen losartan (COZAAR) 100 MG tablet TAKE 1 TABLET BY MOUTH DAILY (Patient taking differently: Take 100 mg by mouth daily.) 90 tablet 0  . Menthol, Topical Analgesic, (BIOFREEZE COLORLESS) 4 % GEL Apply 1 application topically daily as needed (mucle pain).    . metFORMIN (GLUCOPHAGE) 500 MG tablet TAKE 1 TABLET(500 MG) BY MOUTH TWICE DAILY WITH A MEAL (Patient taking differently: Take 500 mg by mouth 2 (two) times daily with a meal.) 180 tablet 0  . methocarbamol (ROBAXIN) 750 MG tablet Take 1 tablet (750 mg total) by mouth 4 (four) times daily. 60 tablet 2  . oxyCODONE-acetaminophen (PERCOCET/ROXICET) 5-325 MG tablet Take 1 tablet by mouth every 4 (four) hours as needed for moderate pain.    . rosuvastatin (CRESTOR) 5 MG tablet TAKE 1 TABLET BY MOUTH EVERY DAY (Patient taking differently: Take 5 mg  by mouth daily.) 30 tablet 0  . venlafaxine (EFFEXOR) 75 MG tablet Take 75 mg by mouth 2 (two) times daily with a meal.   1    Drug Regimen Review  Drug regimen was reviewed and remains appropriate with no significant issues identified  Home:     Functional History:    Functional Status:  Mobility:          ADL:    Cognition:       Last menstrual period 07/10/2013. Physical Exam Vitals and nursing note reviewed.  Constitutional:      Appearance: Normal appearance.     Comments: Awake, alert, sitting up somewhat in bed, appropriate, NAD  HENT:     Head: Normocephalic and atraumatic.     Comments: Smile equal    Nose: Nose normal. No congestion.     Mouth/Throat:     Mouth: Mucous membranes are moist.     Pharynx: Oropharynx is clear. No oropharyngeal exudate.  Eyes:     General:        Right eye: No discharge.        Left eye: No discharge.     Extraocular Movements: Extraocular movements intact.     Conjunctiva/sclera: Conjunctivae normal.  Cardiovascular:     Rate and Rhythm: Normal rate and regular rhythm.     Heart sounds: Normal heart sounds. No murmur heard. No gallop.   Pulmonary:     Effort: Pulmonary effort is normal.     Breath sounds: Normal breath sounds.     Comments: CTA B/L- no W/R/R- good air movement Abdominal:     General: Abdomen is flat.     Comments: Soft, NT, ND, (+)BS -hypoactive  Musculoskeletal:     Cervical back: Normal range of motion. No rigidity.     Comments: MS: UEs 5/5 in biceps,  triceps, grip and finger abd B/L LEs-  RLE- HF 4/5, KE 4+/5, DF 5-/5 and PF 5-/5 LLE- HF 4-/5, KE 4+/5, DF 2/5, PF 4+/5 Foot drop on L at rest  Skin:    General: Skin is warm and dry.     Comments: Back incision with honeycomb dressing--dried drainage at inferior aspect. Well healed old scar right palm.   IV L AC fossa- looks OK   Neurological:     Mental Status: She is alert and oriented to person, place, and time.     Comments: Speech  clear. Follows commands without difficulty. BLE weakness-- Left with ataxia.   Decreased sensation in B/L LEs from distal 1/3 of calves to toes B/L- more DM neuropathy  Psychiatric:     Comments: Appropriate, slightly depressed     Results for orders placed or performed during the hospital encounter of 02/13/20 (from the past 48 hour(s))  Glucose, capillary     Status: Abnormal   Collection Time: 02/22/20  4:11 PM  Result Value Ref Range   Glucose-Capillary 132 (H) 70 - 99 mg/dL    Comment: Glucose reference range applies only to samples taken after fasting for at least 8 hours.  Glucose, capillary     Status: None   Collection Time: 02/22/20 10:30 PM  Result Value Ref Range   Glucose-Capillary 96 70 - 99 mg/dL    Comment: Glucose reference range applies only to samples taken after fasting for at least 8 hours.  Glucose, capillary     Status: Abnormal   Collection Time: 02/23/20  7:06 AM  Result Value Ref Range   Glucose-Capillary 145 (H) 70 - 99 mg/dL    Comment: Glucose reference range applies only to samples taken after fasting for at least 8 hours.  Glucose, capillary     Status: Abnormal   Collection Time: 02/23/20 12:17 PM  Result Value Ref Range   Glucose-Capillary 208 (H) 70 - 99 mg/dL    Comment: Glucose reference range applies only to samples taken after fasting for at least 8 hours.   Comment 1 Notify RN    Comment 2 Document in Chart   Glucose, capillary     Status: Abnormal   Collection Time: 02/23/20  2:56 PM  Result Value Ref Range   Glucose-Capillary 111 (H) 70 - 99 mg/dL    Comment: Glucose reference range applies only to samples taken after fasting for at least 8 hours.   Comment 1 Procedure Error   Glucose, capillary     Status: Abnormal   Collection Time: 02/23/20  7:54 PM  Result Value Ref Range   Glucose-Capillary 206 (H) 70 - 99 mg/dL    Comment: Glucose reference range applies only to samples taken after fasting for at least 8 hours.  Glucose,  capillary     Status: Abnormal   Collection Time: 02/24/20  8:09 AM  Result Value Ref Range   Glucose-Capillary 153 (H) 70 - 99 mg/dL    Comment: Glucose reference range applies only to samples taken after fasting for at least 8 hours.  Glucose, capillary     Status: Abnormal   Collection Time: 02/24/20 12:34 PM  Result Value Ref Range   Glucose-Capillary 110 (H) 70 - 99 mg/dL    Comment: Glucose reference range applies only to samples taken after fasting for at least 8 hours.   No results found.     Medical Problem List and Plan: 1.  L foot drop and B/L LE weakness with  impaired function/ADLs/mobility s/p microdiscectomy and open decompression (L3-L5) secondary to Lumbar stenosis that was progressive/causing L foot drop.  -patient may  Shower if back incision covered  -ELOS/Goals: 7-10 days Mod I 2.  Antithrombotics: -DVT/anticoagulation:  Pharmaceutical: Lovenox  -antiplatelet therapy: N/a 3. Pain Management:   --on Baclofen tid for muscle spasms.   --Neurontin 400 mg qid  --Oxycodone prn 4. Mood: LCSW to follow for evaluation and support.   -antipsychotic agents: N/A 5. Neuropsych: This patient is capable of making decisions on her own behalf. 6. Skin/Wound Care: Routine pressure relief measures.  7. Fluids/Electrolytes/Nutrition: Monitor I/O.   --Add prosource to promote healing.  8. Neurogenic bowel?/Constipation: Goes every 3-4 days. Will continue Miralax with Senna S daily and augment as indicated. LBM Sunday 02/22/20.  9. HTN: Monitor BP tid--continue Norvasc, Cozaar, Clonidine.   --Will order orthostatic vitals as vestibular evaluation due to reports of dizziness/nausea with activity.   10. T2DM: Hgb A1c-6.4 and controlled. Will monitor BS ac/hs--use SSI prn for better control.    --Continue metformin bid.   --Resume Lantus 5 units at bedtime as BS starting to trend up and titrate to home dose.   11. Anxiety/depression: On Effexor bid, Lamictal bid for mood  stabilization.    --She had stopped taking Abilify X 5 month-->doesn't want it.  12. H/o Gastric bypass: Had stopped taking her vitamins. Continue multivitamin.   --Has severe anemia/malabsorbtion  --[facility].    13. Leucocytosis: Monitor for signs of infection.   --recheck CBC in am.  14. Muscle spasms- will add baclofen prn to scheduled Baclofen if spasms increase 15. Urinary retention- based on clinical Sx's- will check PVRs x3 days and cath if required. Might need Flomax?    Bary Leriche- PA-C 02/24/2020  I have personally performed a face to face diagnostic evaluation of this patient and formulated the key components of the plan.  Additionally, I have personally reviewed laboratory data, imaging studies, as well as relevant notes and concur with the physician assistant's documentation above.    Courtney Heys, MD 02/24/2020

## 2020-02-24 NOTE — IPOC Note (Signed)
Individualized overall Plan of Care (IPOC) Patient Details Name: Carrie Cook MRN: [id number] DOB: 1960-10-24  Admitting Diagnosis: Myelopathy concurrent with and due to stenosis of lumbar spine [facility])  Hospital Problems: Principal Problem:   Myelopathy concurrent with and due to stenosis of lumbar spine ([place]) Active Problems:   Lumbar radiculopathy   Lumbar disc herniation with myelopathy   Hypoalbuminemia due to protein-calorie malnutrition ([place])   Muscle spasm   Controlled type 2 diabetes mellitus with hyperglycemia, without long-term current use of insulin ([facility])   Essential hypertension   Drug induced constipation   Postoperative pain     Functional Problem List: Nursing Bladder,Bowel,Pain,Skin Integrity  PT Balance,Endurance,Motor,Pain,Sensory  OT Balance,Endurance,Motor,Pain,Safety,Sensory,Skin Integrity  SLP    TR         Basic ADL's: OT Grooming,Bathing,Dressing,Toileting     Advanced  ADL's: OT Simple Meal Preparation     Transfers: PT Bed Mobility,Bed to Chair,Car,Furniture  OT Toilet,Tub/Shower     Locomotion: PT Ambulation,Wheelchair Mobility,Stairs     Additional Impairments: OT None  SLP        TR      Anticipated Outcomes Item Anticipated Outcome  Self Feeding    Swallowing      Basic self-care  Mod I  Toileting  Mod I   Bathroom Transfers Mod I toilet, Supervision shower  Bowel/Bladder  mod I  Transfers  Mod I  Locomotion  Mod I  Communication     Cognition     Pain  less than 3 out of 10  Safety/Judgment  mod I   Therapy Plan: PT Intensity: Minimum of 1-2 x/day ,45 to 90 minutes PT Frequency: 5 out of 7 days PT Duration Estimated Length of Stay: 8-10 days OT Intensity: Minimum of 1-2 x/day, 45 to 90 minutes OT Frequency: 5 out of 7 days OT Duration/Estimated Length of Stay: 7-10 days      Team Interventions: Nursing Interventions Patient/Family Education,Pain Management,Bladder Management,Medication  Management,Bowel Management,Skin Care/Wound Management  PT interventions Ambulation/gait training,Discharge planning,Functional mobility training,Psychosocial support,Therapeutic Activities,Balance/vestibular training,Disease management/prevention,Neuromuscular re-education,Skin care/wound Heritage manager propulsion/positioning,Cognitive remediation/compensation,DME/adaptive equipment instruction,Splinting/orthotics,Pain management,UE/LE Strength taining/ROM,Community reintegration,Functional electrical stimulation,Patient/family education,Stair training,UE/LE Coordination activities  OT Interventions Balance/vestibular training,Community reintegration,Discharge planning,Disease Pharmacologist instruction,Functional mobility training,Neuromuscular re-education,Pain management,Patient/family education,Psychosocial support,Self Care/advanced ADL retraining,Skin care/wound managment,Therapeutic Activities,Therapeutic Exercise,UE/LE Strength taining/ROM  SLP Interventions    TR Interventions    SW/CM Interventions Discharge Planning,Psychosocial Support,Patient/Family Education   Barriers to Discharge MD  Medical stability  Nursing      PT Inaccessible home environment,Decreased caregiver support,Home environment access/layout 3 STE along front entrance with 0 rails and 7 STE along back entrance with 2 rails; husband works and can only provide intermittent assist  OT Decreased caregiver support,Home environment access/layout husband self-employed, can provide intermittent assist  SLP      SW Decreased caregiver support,Lack of/limited family support     Team Discharge Planning: Destination: PT-Home ,OT- Home , SLP-  Projected Follow-up: PT-Home health PT, OT-  Home health OT, SLP-  Projected Equipment Needs: PT-To be determined, OT- 3 in 1 bedside comode,Tub/shower seat, SLP-  Equipment Details: PT-has RW, rollator, and WC, OT-   Patient/family involved in discharge planning: PT- Patient,  OT-Patient, SLP-   MD ELOS: 7--10 days. Medical Rehab Prognosis:  Good Assessment: 60 year old female with history of HTN, T2DM, R-TKR (done 6/21) 06/2019, gastric bypass, Vit D deficiency, depression/anxiety, RLE pain with work up revealing moderate to severe spinal stenosis L3-4. She underwent L3/4 and L4/5 decompression on 01/19 at surgical  center by Dr. Zada Finders. She was readmitted 01/21 due to new onset of left foot drop, severe muscle spasms, severe pain and fall. Marland Kitchen MRI spine done revealing severe spinal canal narrowing at L3-L5 levels with increased conspicuity of epidural fat--question postprocedural edema contributing to stenosis. She underwent bilateral L3/4 and L4/5 facetectomy with decompression of thecal nerve roots on 01/22.  Post op, neuropathy and LLE weakness improving but has had severe muscle spasms requiring multiple medication adjustment. She was also found to be Covid + but has been asymptomatic and has been on isolation X 10 days.     Spasms have resolved (last this Sunday) with addition of baclofen and finally had BM after 10 days. Intake good. She continues to have cough as well as nausea with activity. Therapy has been ongoing and patient limited by BLE weakness with ataxia and sensory deficits, balance deficits with anterior lean as well as reports of nausea with OOBactivity OOB/dizziness with upward gaze. Will set goals for Mod I with PT/OT.  Due to the current state of emergency, patients may not be receiving their 3-hours of Medicare-mandated therapy.  See Team Conference Notes for weekly updates to the plan of care

## 2020-02-24 NOTE — Progress Notes (Signed)
Inpatient Rehabilitation Admissions Coordinator  CIR bed is available to admit her today.I met with patient at bedside and she is in agreement to admit. I have contacted Dr. Zada Finders, acute team and TOC. I will make the arrangements to admit today.  Danne Baxter, RN, MSN Rehab Admissions Coordinator [phone] 02/24/2020 10:25 AM

## 2020-02-24 NOTE — PMR Pre-admission (Signed)
PMR Admission Coordinator Pre-Admission Assessment  Patient: Carrie Cook is an 60 y.o., female MRN: [id number] DOB: 02/07/1960 Height: 5' 3"  (160 cm) Weight: 90.3 kg  Insurance Information HMO:    PPO:      PCP:      IPA:      80/20:      OTHER:  PRIMARY: Medicare a and b      Policy#: [id number]      Subscriber: pt Benefits:  Phone #: passport one online     Name: 02/24/20 Eff. Date: a 11/24/2010 b 02/24/2011     Deduct: $1556      Out of Pocket Max: none      Life Max: none CIR: 100%      SNF: 20 full days Outpatient: 80%     Co-Pay: 20% Home Health: 100%      Co-Pay: none DME: 80%     Co-Pay: 20% Providers: pt choice  SECONDARY: none  Financial Counselor:       Phone#:   The Therapist, art Information Summary" for patients in Inpatient Rehabilitation Facilities with attached "Privacy Act [place] Records" was provided and verbally reviewed with: Patient  Emergency Contact Information Contact Information    Name Relation Home Work Mobile   Fandino,Robert Spouse [phone]   [place] Sister   [phone]   michael,misty Sister   [phone]     Patient Admitting Diagnosis: debility  History of Present Illness: Carrie Cook is a 60 year old female with history of HTN, T2DM, R-TKR, RLE pain w Work up revealing moderate to severe spinal stenosis L3-4. She underwent L3/4 and L4/5 decompression on 01/19 at surgical center by Dr. Zada Finders. She was readmitted 01/21 due to new onset of left foot drop, severe muscle spasms, severe pain and fall. Marland Kitchen MRI spine done revealing severe spinal canal narrowing at L3-L5 levels with increased conspicuity of epidural fat--question postprocedural edema contributing to stenosis. She underwent bilateral L3/4 and L4/5 facetectomy with decompression of thecal nerve roots. Post op neuropathy and LLE weakness improving but has had severe muscle spasms requiring multiple medication adjustment. She was  also found to be Covid + but has been asymptomatic and has been on isolation. Isolation removed 02/23/20 after 10 days of airborne isolation.     Patient's medical record from [facility]  has been reviewed by the rehabilitation admission coordinator and physician.  Past Medical History  Past Medical History:  Diagnosis Date  . Anemia    PT HAS HAD COLONOSCOPY AND ENDOSCOPY WORK UP - NO PROBLEMS FOUND AS SOURCE OF ANEMIA -- PT HAD IRON INFUSION AT [facility] 11/13/12-AFTER SEEING HEMATOLOGIST DR. Fromberg AND HE GAVE HEMATOLOGIC CLEARANCE FOR GASTRIC BYPASS SURGERY.  Marland Kitchen Anxiety   . Asthma    daily and prn inhalers  . Degenerative joint disease    right knee, spine - STATES INTERMITTENT  NUMBNESS DOWN RT LEG WITH PROLONGED STANDING OR WALKING- THINKS RELATED TO HER SPINE PROBLEMS  . Dental crowns present   . Depression   . Family history of anesthesia complication    pt's mother and sister have hx. of post-op N/V  . Fibroids    UTERINE  . Gastroesophageal reflux disease    none for 2 years since Bariatric surgery.  . H/O hiatal hernia   . History of endometriosis   . History of kidney stones   . Hyperlipidemia   . Hypertension    under control with med., has been on med. x  2 yr.  . Insulin dependent diabetes mellitus   . Lock jaw    jaw locks open if opens mouth wide  . Migraines   . Neuropathy    FEET  . Obesity   . Palpitations   . PONV (postoperative nausea and vomiting)   . Shortness of breath    with daily activities  . Sleep apnea    post-op didn't need CPAP but now states needs it again but not using    Family History   family history includes Anesthesia problems in her mother and sister; COPD in her father and mother; Cancer in her mother; Diabetes in her maternal grandfather and another family member; Heart disease in her mother; Hyperlipidemia in her father; Hypertension in her father; Thyroid disease in her mother, paternal uncle, and sister.  Prior  Rehab/Hospitalizations Has the patient had prior rehab or hospitalizations prior to admission? Yes  Has the patient had major surgery during 100 days prior to admission? Yes   Current Medications  Current Facility-Administered Medications:  .  0.9 %  sodium chloride infusion, , Intravenous, Continuous, Costella, Vista Mink, PA-C, Last Rate: 100 mL/hr at 02/14/20 0058, New Bag at 02/14/20 0058 .  0.9 %  sodium chloride infusion, 250 mL, Intravenous, Continuous, Costella, Vincent J, PA-C, Last Rate: 1 mL/hr at 02/14/20 1943, 250 mL at 02/14/20 1943 .  0.9 %  sodium chloride infusion, , Intravenous, Continuous, Costella, Vista Mink, PA-C, Last Rate: 10 mL/hr at 02/14/20 1830, New Bag at 02/14/20 1830 .  0.9 %  sodium chloride infusion, 250 mL, Intravenous, Continuous, Costella, Vincent J, PA-C .  acetaminophen (TYLENOL) tablet 650 mg, 650 mg, Oral, Q4H PRN **OR** acetaminophen (TYLENOL) suppository 650 mg, 650 mg, Rectal, Q4H PRN, Costella, Vincent J, PA-C .  albuterol (VENTOLIN HFA) 108 (90 Base) MCG/ACT inhaler 2 puff, 2 puff, Inhalation, Q6H PRN, Costella, Vincent J, PA-C .  amLODipine (NORVASC) tablet 2.5 mg, 2.5 mg, Oral, Daily, Costella, Vincent J, PA-C, 2.5 mg at 02/24/20 0908 .  ARIPiprazole (ABILIFY) tablet 5 mg, 5 mg, Oral, Daily, Costella, Vincent J, PA-C, 5 mg at 02/21/20 0900 .  baclofen (LIORESAL) tablet 10 mg, 10 mg, Oral, TID, Judith Part, MD, 10 mg at 02/24/20 0909 .  bisacodyl (DULCOLAX) suppository 10 mg, 10 mg, Rectal, Daily PRN, Costella, Vincent J, PA-C, 10 mg at 02/18/20 2150 .  budesonide (PULMICORT) nebulizer solution 0.25 mg, 0.25 mg, Nebulization, BID PRN, Consuella Lose, MD .  cloNIDine (CATAPRES) tablet 0.1 mg, 0.1 mg, Oral, Daily, Costella, Vincent J, PA-C, 0.1 mg at 02/24/20 0907 .  docusate sodium (COLACE) capsule 100 mg, 100 mg, Oral, BID, Costella, Vincent J, PA-C, 100 mg at 02/24/20 0908 .  estradiol (ESTRACE) tablet 2 mg, 2 mg, Oral, QPM, Costella,  Vincent J, PA-C, 2 mg at 02/23/20 1659 .  gabapentin (NEURONTIN) capsule 400 mg, 400 mg, Oral, QID, Costella, Vincent J, PA-C, 400 mg at 02/24/20 0909 .  Gerhardt's butt cream, , Topical, BID, Judith Part, MD, Given at 02/24/20 0910 .  heparin injection 5,000 Units, 5,000 Units, Subcutaneous, Q8H, Judith Part, MD, 5,000 Units at 02/24/20 [phone] .  HYDROcodone-acetaminophen (NORCO/VICODIN) 5-325 MG per tablet 1 tablet, 1 tablet, Oral, Q4H PRN, Costella, Vista Mink, PA-C, 1 tablet at 02/17/20 8101 .  HYDROmorphone (DILAUDID) injection 0.5-1 mg, 0.5-1 mg, Intravenous, Q2H PRN, Costella, Vincent J, PA-C, 1 mg at 02/18/20 2317 .  insulin aspart (novoLOG) injection 0-15 Units, 0-15 Units, Subcutaneous, TID WC, Costella, Vista Mink, PA-C,  3 Units at 02/24/20 0906 .  insulin aspart (novoLOG) injection 0-5 Units, 0-5 Units, Subcutaneous, QHS, Costella, Vincent Lenna Sciara, PA-C, 2 Units at 02/23/20 2144 .  lamoTRIgine (LAMICTAL) tablet 100 mg, 100 mg, Oral, BID, Costella, Vincent J, PA-C, 100 mg at 02/24/20 0908 .  loratadine (CLARITIN) tablet 10 mg, 10 mg, Oral, Daily, Costella, Vincent J, PA-C, 10 mg at 02/24/20 0908 .  losartan (COZAAR) tablet 100 mg, 100 mg, Oral, Daily, Costella, Vincent J, PA-C, 100 mg at 02/24/20 0910 .  menthol-cetylpyridinium (CEPACOL) lozenge 3 mg, 1 lozenge, Oral, PRN **OR** phenol (CHLORASEPTIC) mouth spray 1 spray, 1 spray, Mouth/Throat, PRN, Costella, Vincent J, PA-C .  metFORMIN (GLUCOPHAGE) tablet 500 mg, 500 mg, Oral, BID WC, Costella, Vincent J, PA-C, 500 mg at 02/24/20 0908 .  methocarbamol (ROBAXIN) tablet 500 mg, 500 mg, Oral, Q6H PRN, 500 mg at 02/23/20 1338 **OR** methocarbamol (ROBAXIN) 500 mg in dextrose 5 % 50 mL IVPB, 500 mg, Intravenous, Q6H PRN, Costella, Vincent J, PA-C, Last Rate: 100 mL/hr at 02/14/20 1847, 500 mg at 02/14/20 1847 .  multivitamin with minerals tablet 1 tablet, 1 tablet, Oral, Daily, Costella, Vista Mink, PA-C, 1 tablet at 02/24/20 0908 .   ondansetron (ZOFRAN) tablet 4 mg, 4 mg, Oral, Q6H PRN **OR** ondansetron (ZOFRAN) injection 4 mg, 4 mg, Intravenous, Q6H PRN, Costella, Vincent J, PA-C, 4 mg at 02/23/20 1656 .  oxyCODONE (Oxy IR/ROXICODONE) immediate release tablet 5-10 mg, 5-10 mg, Oral, Q3H PRN, Costella, Vincent J, PA-C, 10 mg at 02/24/20 1011 .  polyethylene glycol (MIRALAX / GLYCOLAX) packet 17 g, 17 g, Oral, Daily, Ostergard, Joyice Faster, MD, 17 g at 02/23/20 1034 .  rosuvastatin (CRESTOR) tablet 5 mg, 5 mg, Oral, Daily, Costella, Vincent J, PA-C, 5 mg at 02/24/20 0909 .  senna (SENOKOT) tablet 8.6 mg, 1 tablet, Oral, BID, Costella, Vincent J, PA-C, 8.6 mg at 02/23/20 2148 .  senna-docusate (Senokot-S) tablet 1 tablet, 1 tablet, Oral, QHS PRN, Costella, Vista Mink, PA-C, 1 tablet at 02/14/20 0123 .  sodium chloride flush (NS) 0.9 % injection 3 mL, 3 mL, Intravenous, Q12H, Costella, Vincent J, PA-C, 3 mL at 02/23/20 2146 .  sodium chloride flush (NS) 0.9 % injection 3 mL, 3 mL, Intravenous, PRN, Costella, Vincent J, PA-C .  sodium chloride flush (NS) 0.9 % injection 3 mL, 3 mL, Intravenous, Q12H, Costella, Vincent J, PA-C, 3 mL at 02/24/20 0911 .  sodium chloride flush (NS) 0.9 % injection 3 mL, 3 mL, Intravenous, PRN, Costella, Vista Mink, PA-C .  venlafaxine [facility]) tablet 75 mg, 75 mg, Oral, BID WC, Costella, Vista Mink, PA-C, 75 mg at 02/24/20 0909  Patients Current Diet:  Diet Order            Diet heart healthy/carb modified Room service appropriate? Yes; Fluid consistency: Thin  Diet effective now                 Precautions / Restrictions Precautions Precautions: Back,Fall Precaution Booklet Issued: No Precaution Comments: pt with 1 fall at home after 1st surgery on 02/11/20 Restrictions Weight Bearing Restrictions: No   Has the patient had 2 or more falls or a fall with injury in the past year? Yes  Prior Activity Level Community (5-7x/wk): Mod I with RW. Pain and spasms for a month pta  Prior Functional  Level Self Care: Did the patient need help bathing, dressing, using the toilet or eating? Needed some help  Indoor Mobility: Did the patient need assistance with walking from room  to room (with or without device)? Independent  Stairs: Did the patient need assistance with internal or external stairs (with or without device)? Independent  Functional Cognition: Did the patient need help planning regular tasks such as shopping or remembering to take medications? [place] / [place] Devices/Equipment: Cane (specify quad or straight),Walker (specify type) Home Equipment: Walker - 2 wheels,Walker - 4 wheels,Cane - single point,Grab bars - tub/shower,Bedside commode,Toilet riser  Prior Device Use: Indicate devices/aids used by the patient prior to current illness, exacerbation or injury? Walker  Current Functional Level Cognition  Overall Cognitive Status: Within Functional Limits for tasks assessed Orientation Level: Oriented X4 General Comments: anxious about falling    Extremity Assessment (includes Sensation/Coordination)  Upper Extremity Assessment: Generalized weakness  Lower Extremity Assessment: Defer to PT evaluation RLE Deficits / Details: AAROM WFL; limited due to spasms/cramping LLE Deficits / Details: AAROM WFL; limited due to spasms/cramping    ADLs  Overall ADL's : Needs assistance/impaired Eating/Feeding: Modified independent,Bed level Eating/Feeding Details (indicate cue type and reason): pt reports inability to sit up to eat Grooming: Wash/dry hands,Set up,Sitting Grooming Details (indicate cue type and reason): wash cloth used Lower Body Bathing: Total assistance Lower Body Dressing: Maximal assistance,Sit to/from stand Lower Body Dressing Details (indicate cue type and reason): don socks. demonstrates knee flexion but unable to cross Toilet Transfer: Moderate assistance,Ambulation,BSC,RW Toilet Transfer Details (indicate cue  type and reason): cues for hand placement and anterior weight shift Toileting- Clothing Manipulation and Hygiene: Moderate assistance,Sitting/lateral lean Toileting - Clothing Manipulation Details (indicate cue type and reason): pt able to complete peri care with lateral lean Functional mobility during ADLs: Minimal assistance,Rolling walker General ADL Comments: pt reports supine since Friday and not oob over weekend. pt eager to get OOB this session. pt encouraged to get staff to get her up daily even if with steady to decrease fall risk. pt reporting no males present. pt educated that female staff can help with DME if that makes her more confident    Mobility  Overal bed mobility: Needs Assistance Bed Mobility: Supine to Sit,Rolling Rolling: Min assist Sidelying to sit: Min assist Supine to sit: Min assist Sit to supine: Mod assist Sit to sidelying: Mod assist General bed mobility comments: pt rolling R and L then R and then L before supine to sit with min (A). pt without pain during transfer compared to previous session. pt able to static sit at normal height without spasm    Transfers  Overall transfer level: Needs assistance Equipment used: Rolling walker (2 wheeled) Transfers: Sit to/from Stand Sit to Stand: +2 physical assistance,Min assist General transfer comment: first attempt from bed total +2 min (A) pt able to progress to Mod (A) with additional attempts during session.    Ambulation / Gait / Stairs / Wheelchair Mobility  Ambulation/Gait Ambulation/Gait assistance: Herbalist (Feet): 20 Feet (and then 26 feet, both with RW) Assistive device: Rolling walker (2 wheeled) Gait Pattern/deviations: Step-through pattern,Decreased stride length General Gait Details: pt generally steady, light use fo the RW,  wider based steps, pt still feeling tingling in her LE's and feet, so gait appears mildly uncoordinated.  Fully upright posture today. Gait velocity:  slower Gait velocity interpretation: <1.8 ft/sec, indicate of risk for recurrent falls Wheelchair Mobility Wheelchair mobility:  (began discussion of use of w/c if home alone. Pt thinks this could be done and be helpful. She agrees sitting is painful AND she wants to minimize fall risk.)  Posture / Balance Dynamic Sitting Balance Sitting balance - Comments: Pt sat without need for UE's.  Much less tense and less anticipation of a spasm eminent., Balance Overall balance assessment: Needs assistance Sitting-balance support: Bilateral upper extremity supported,Feet supported Sitting balance-Leahy Scale: Fair Sitting balance - Comments: Pt sat without need for UE's.  Much less tense and less anticipation of a spasm eminent., Standing balance support: Bilateral upper extremity supported,During functional activity Standing balance-Leahy Scale: Poor Standing balance comment: reliant on RW    Special needs/care consideration COVID + 02/13/20 and has completed 10 days of airborne isolation Designated visitor is spouse, Herbie [place]   Previous Home Environment  Living Arrangements: Spouse/significant other  Lives With: Spouse Available Help at Discharge:  (spouse self employed Dealer, assist intermittently) Type of Home: House Home Layout: One level Home Access: Stairs to enter Entrance Stairs-Rails: None Technical brewer of Steps: 3 Bathroom Shower/Tub: Multimedia programmer: Programmer, systems: Yes [place]: No Additional Comments: pt reports she will be home alone during the day  Discharge Living Setting Plans for Discharge Living Setting: Patient's home,Lives with (comment) (spouse) Type of Home at Discharge: House Discharge Home Layout: One level Discharge Home Access: Stairs to enter Entrance Stairs-Rails: None Entrance Stairs-Number of Steps: 3 Discharge Bathroom Shower/Tub: Walk-in shower Discharge Bathroom Toilet: Standard Discharge  Bathroom Accessibility: Yes How Accessible: Accessible via walker Does the patient have any problems obtaining your medications?: No  Social/Family/Support Systems Patient Roles: Spouse Contact Information: spouse Herbie [place] [phone] Anticipated Caregiver: spouse Anticipated Caregiver's Contact Information: see above Ability/Limitations of Caregiver: spouse self employed days, prn assist Caregiver Availability: Intermittent Discharge Plan Discussed with Primary Caregiver: Yes Is Caregiver In Agreement with Plan?: Yes Does Caregiver/Family have Issues with Lodging/Transportation while Pt is in Rehab?: No  Goals Patient/Family Goal for Rehab: Mod I with PT and OT Expected length of stay: ELOS 7 to 10 days Pt/Family Agrees to Admission and willing to participate: Yes Program Orientation Provided & Reviewed with Pt/Caregiver Including Roles  & Responsibilities: Yes  Decrease burden of Care through IP rehab admission: n/a  Possible need for SNF placement upon discharge: not anticipated  Patient Condition: I have reviewed medical records from [facility] , spoken with CM, and patient. I met with patient at the bedside for inpatient rehabilitation assessment.  Patient will benefit from ongoing PT and OT, can actively participate in 3 hours of therapy a day 5 days of the week, and can make measurable gains during the admission.  Patient will also benefit from the coordinated team approach during an Inpatient Acute Rehabilitation admission.  The patient will receive intensive therapy as well as Rehabilitation physician, nursing, social worker, and care management interventions.  Due to bladder management, bowel management, safety, skin/wound care, disease management, medication administration, pain management and patient education the patient requires 24 hour a day rehabilitation nursing.  The patient is currently min assist overall with mobility and basic ADLs.  Discharge setting and  therapy post discharge at home with home health is anticipated.  Patient has agreed to participate in the Acute Inpatient Rehabilitation Program and will admit today.  Preadmission Screen Completed By:  Cleatrice Burke, 02/24/2020 10:31 AM ______________________________________________________________________   Discussed status with Dr. Dagoberto Ligas on 02/24/2020 at 1043 and received approval for admission today.  Admission Coordinator:  Cleatrice Burke, RN, time  7124 Date  02/24/2020   Assessment/Plan: Diagnosis: 1. Does the need for close, 24 hr/day Medical supervision in concert with the patient's rehab needs  make it unreasonable for this patient to be served in a less intensive setting? Yes 2. Co-Morbidities requiring supervision/potential complications: COVID_ off precautions, RLE weakness and LLE weakness s/p microdiscectomy and open decompression 3. Due to bladder management, bowel management, safety, skin/wound care, disease management, medication administration, pain management and patient education, does the patient require 24 hr/day rehab nursing? Yes 4. Does the patient require coordinated care of a physician, rehab nurse, PT, OT, and SLP to address physical and functional deficits in the context of the above medical diagnosis(es)? Yes Addressing deficits in the following areas: balance, endurance, locomotion, strength, transferring, bathing, dressing, feeding, grooming and toileting 5. Can the patient actively participate in an intensive therapy program of at least 3 hrs of therapy 5 days a week? Yes 6. The potential for patient to make measurable gains while on inpatient rehab is good 7. Anticipated functional outcomes upon discharge from inpatient rehab: modified independent PT, modified independent OT, n/a SLP 8. Estimated rehab length of stay to reach the above functional goals is: 7-10 days 9. Anticipated discharge destination: Home 10. Overall Rehab/Functional Prognosis:  good   MD Signature:

## 2020-02-24 NOTE — Progress Notes (Signed)
Carrie Heys, MD  Physician  Physical Medicine and Rehabilitation  PMR Pre-admission     Signed  Date of Service:  02/24/2020 10:31 AM      Related encounter: ED to Hosp-Admission (Current) from 02/13/2020 in [place]          Show:Clear all [x] Manual[x] Template[x] Copied  Added by: [x] Carrie Cook, Carrie Kelch, RN[x] Carrie Heys, MD   [] Hover for details  PMR Admission Coordinator Pre-Admission Assessment   Patient: Carrie Cook is an 60 y.o., female MRN: [id number] DOB: 1960/06/19 Height: 5' 3"  (160 cm) Weight: 90.3 kg   Insurance Information HMO:    PPO:      PCP:      IPA:      80/20:      OTHER:  PRIMARY: Medicare a and b      Policy#: [id number]      Subscriber: pt Benefits:  Phone #: passport one online     Name: 02/24/20 Eff. Date: a 11/24/2010 b 02/24/2011     Deduct: $1556      Out of Pocket Max: none      Life Max: none CIR: 100%      SNF: 20 full days Outpatient: 80%     Co-Pay: 20% Home Health: 100%      Co-Pay: none DME: 80%     Co-Pay: 20% Providers: pt choice  SECONDARY: none   Financial Counselor:       Phone#:    The Therapist, art Information Summary" for patients in Inpatient Rehabilitation Facilities with attached "Privacy Act [place] Records" was provided and verbally reviewed with: Patient   Emergency Contact Information         Contact Information     Name Relation Home Work Mobile    Carrie Cook Spouse [phone]    [place] Sister     [phone]    Carrie Cook Sister     [phone]       Patient Admitting Diagnosis: debility   History of Present Illness: Carrie Cook is a 60 year old female with history of HTN, T2DM, R-TKR, RLE pain w Work up revealing moderate to severe spinal stenosis L3-4. She underwent L3/4 and L4/5 decompression on 01/19 at surgical center by Carrie Cook. She was readmitted 01/21 due to new onset of left foot  drop, severe muscle spasms, severe pain and fall. Marland Kitchen MRI spine done revealing severe spinal canal narrowing at L3-L5 levels with increased conspicuity of epidural fat--question postprocedural edema contributing to stenosis. She underwent bilateral L3/4 and L4/5 facetectomy with decompression of thecal nerve roots. Post op neuropathy and LLE weakness improving but has had severe muscle spasms requiring multiple medication adjustment. She was also found to be Covid + but has been asymptomatic and has been on isolation. Isolation removed 02/23/20 after 10 days of airborne isolation.    Patient's medical record from [facility]  has been reviewed by the rehabilitation admission coordinator and physician.   Past Medical History      Past Medical History:  Diagnosis Date  . Anemia      PT HAS HAD COLONOSCOPY AND ENDOSCOPY WORK UP - NO PROBLEMS FOUND AS SOURCE OF ANEMIA -- PT HAD IRON INFUSION AT [facility] 11/13/12-AFTER SEEING HEMATOLOGIST DR. Worland AND HE GAVE HEMATOLOGIC CLEARANCE FOR GASTRIC BYPASS SURGERY.  Marland Kitchen Anxiety    . Asthma      daily and prn inhalers  . Degenerative joint disease  right knee, spine - STATES INTERMITTENT  NUMBNESS DOWN RT LEG WITH PROLONGED STANDING OR WALKING- THINKS RELATED TO HER SPINE PROBLEMS  . Dental crowns present    . Depression    . Family history of anesthesia complication      pt's mother and sister have hx. of post-op N/V  . Fibroids      UTERINE  . Gastroesophageal reflux disease      none for 2 years since Bariatric surgery.  . H/O hiatal hernia    . History of endometriosis    . History of kidney stones    . Hyperlipidemia    . Hypertension      under control with med., has been on med. x 2 yr.  . Insulin dependent diabetes mellitus    . Lock jaw      jaw locks open if opens mouth wide  . Migraines    . Neuropathy      FEET  . Obesity    . Palpitations    . PONV (postoperative nausea and vomiting)    . Shortness of breath       with daily activities  . Sleep apnea      post-op didn't need CPAP but now states needs it again but not using      Family History   family history includes Anesthesia problems in her mother and sister; COPD in her father and mother; Cancer in her mother; Diabetes in her maternal grandfather and another family member; Heart disease in her mother; Hyperlipidemia in her father; Hypertension in her father; Thyroid disease in her mother, paternal uncle, and sister.   Prior Rehab/Hospitalizations Has the patient had prior rehab or hospitalizations prior to admission? Yes   Has the patient had major surgery during 100 days prior to admission? Yes              Current Medications   Current Facility-Administered Medications:  .  0.9 %  sodium chloride infusion, , Intravenous, Continuous, Costella, Vista Mink, PA-C, Last Rate: 100 mL/hr at 02/14/20 0058, New Bag at 02/14/20 0058 .  0.9 %  sodium chloride infusion, 250 mL, Intravenous, Continuous, Costella, Vincent J, PA-C, Last Rate: 1 mL/hr at 02/14/20 1943, 250 mL at 02/14/20 1943 .  0.9 %  sodium chloride infusion, , Intravenous, Continuous, Costella, Vista Mink, PA-C, Last Rate: 10 mL/hr at 02/14/20 1830, New Bag at 02/14/20 1830 .  0.9 %  sodium chloride infusion, 250 mL, Intravenous, Continuous, Costella, Vincent J, PA-C .  acetaminophen (TYLENOL) tablet 650 mg, 650 mg, Oral, Q4H PRN **OR** acetaminophen (TYLENOL) suppository 650 mg, 650 mg, Rectal, Q4H PRN, Costella, Vincent J, PA-C .  albuterol (VENTOLIN HFA) 108 (90 Base) MCG/ACT inhaler 2 puff, 2 puff, Inhalation, Q6H PRN, Costella, Vincent J, PA-C .  amLODipine (NORVASC) tablet 2.5 mg, 2.5 mg, Oral, Daily, Costella, Vincent J, PA-C, 2.5 mg at 02/24/20 0908 .  ARIPiprazole (ABILIFY) tablet 5 mg, 5 mg, Oral, Daily, Costella, Vincent J, PA-C, 5 mg at 02/21/20 0900 .  baclofen (LIORESAL) tablet 10 mg, 10 mg, Oral, TID, Carrie Part, MD, 10 mg at 02/24/20 0909 .  bisacodyl (DULCOLAX)  suppository 10 mg, 10 mg, Rectal, Daily PRN, Costella, Vincent J, PA-C, 10 mg at 02/18/20 2150 .  budesonide (PULMICORT) nebulizer solution 0.25 mg, 0.25 mg, Nebulization, BID PRN, Consuella Lose, MD .  cloNIDine (CATAPRES) tablet 0.1 mg, 0.1 mg, Oral, Daily, Costella, Vincent J, PA-C, 0.1 mg at 02/24/20 0907 .  docusate sodium (  COLACE) capsule 100 mg, 100 mg, Oral, BID, Costella, Vincent J, PA-C, 100 mg at 02/24/20 0908 .  estradiol (ESTRACE) tablet 2 mg, 2 mg, Oral, QPM, Costella, Vincent J, PA-C, 2 mg at 02/23/20 1659 .  gabapentin (NEURONTIN) capsule 400 mg, 400 mg, Oral, QID, Costella, Vincent J, PA-C, 400 mg at 02/24/20 0909 .  Gerhardt's butt cream, , Topical, BID, Carrie Part, MD, Given at 02/24/20 0910 .  heparin injection 5,000 Units, 5,000 Units, Subcutaneous, Q8H, Carrie Part, MD, 5,000 Units at 02/24/20 [phone] .  HYDROcodone-acetaminophen (NORCO/VICODIN) 5-325 MG per tablet 1 tablet, 1 tablet, Oral, Q4H PRN, Costella, Vista Mink, PA-C, 1 tablet at 02/17/20 3149 .  HYDROmorphone (DILAUDID) injection 0.5-1 mg, 0.5-1 mg, Intravenous, Q2H PRN, Costella, Vincent J, PA-C, 1 mg at 02/18/20 2317 .  insulin aspart (novoLOG) injection 0-15 Units, 0-15 Units, Subcutaneous, TID WC, Costella, Vincent Lenna Sciara, PA-C, 3 Units at 02/24/20 7026 .  insulin aspart (novoLOG) injection 0-5 Units, 0-5 Units, Subcutaneous, QHS, Costella, Vincent Lenna Sciara, PA-C, 2 Units at 02/23/20 2144 .  lamoTRIgine (LAMICTAL) tablet 100 mg, 100 mg, Oral, BID, Costella, Vincent J, PA-C, 100 mg at 02/24/20 0908 .  loratadine (CLARITIN) tablet 10 mg, 10 mg, Oral, Daily, Costella, Vincent J, PA-C, 10 mg at 02/24/20 0908 .  losartan (COZAAR) tablet 100 mg, 100 mg, Oral, Daily, Costella, Vincent J, PA-C, 100 mg at 02/24/20 0910 .  menthol-cetylpyridinium (CEPACOL) lozenge 3 mg, 1 lozenge, Oral, PRN **OR** phenol (CHLORASEPTIC) mouth spray 1 spray, 1 spray, Mouth/Throat, PRN, Costella, Vincent J, PA-C .  metFORMIN (GLUCOPHAGE)  tablet 500 mg, 500 mg, Oral, BID WC, Costella, Vincent J, PA-C, 500 mg at 02/24/20 0908 .  methocarbamol (ROBAXIN) tablet 500 mg, 500 mg, Oral, Q6H PRN, 500 mg at 02/23/20 1338 **OR** methocarbamol (ROBAXIN) 500 mg in dextrose 5 % 50 mL IVPB, 500 mg, Intravenous, Q6H PRN, Costella, Vincent J, PA-C, Last Rate: 100 mL/hr at 02/14/20 1847, 500 mg at 02/14/20 1847 .  multivitamin with minerals tablet 1 tablet, 1 tablet, Oral, Daily, Costella, Vista Mink, PA-C, 1 tablet at 02/24/20 0908 .  ondansetron (ZOFRAN) tablet 4 mg, 4 mg, Oral, Q6H PRN **OR** ondansetron (ZOFRAN) injection 4 mg, 4 mg, Intravenous, Q6H PRN, Costella, Vincent J, PA-C, 4 mg at 02/23/20 1656 .  oxyCODONE (Oxy IR/ROXICODONE) immediate release tablet 5-10 mg, 5-10 mg, Oral, Q3H PRN, Costella, Vincent J, PA-C, 10 mg at 02/24/20 1011 .  polyethylene glycol (MIRALAX / GLYCOLAX) packet 17 g, 17 g, Oral, Daily, Ostergard, Joyice Faster, MD, 17 g at 02/23/20 1034 .  rosuvastatin (CRESTOR) tablet 5 mg, 5 mg, Oral, Daily, Costella, Vincent J, PA-C, 5 mg at 02/24/20 0909 .  senna (SENOKOT) tablet 8.6 mg, 1 tablet, Oral, BID, Costella, Vincent J, PA-C, 8.6 mg at 02/23/20 2148 .  senna-docusate (Senokot-S) tablet 1 tablet, 1 tablet, Oral, QHS PRN, Costella, Vista Mink, PA-C, 1 tablet at 02/14/20 0123 .  sodium chloride flush (NS) 0.9 % injection 3 mL, 3 mL, Intravenous, Q12H, Costella, Vincent J, PA-C, 3 mL at 02/23/20 2146 .  sodium chloride flush (NS) 0.9 % injection 3 mL, 3 mL, Intravenous, PRN, Costella, Vincent J, PA-C .  sodium chloride flush (NS) 0.9 % injection 3 mL, 3 mL, Intravenous, Q12H, Costella, Vincent J, PA-C, 3 mL at 02/24/20 0911 .  sodium chloride flush (NS) 0.9 % injection 3 mL, 3 mL, Intravenous, PRN, Costella, Vincent J, PA-C .  venlafaxine (EFFEXOR) tablet 75 mg, 75 mg, Oral, BID WC, Costella, Vista Mink,  PA-C, 75 mg at 02/24/20 0909   Patients Current Diet:     Diet Order                      Diet heart healthy/carb modified  Room service appropriate? Yes; Fluid consistency: Thin  Diet effective now                      Precautions / Restrictions Precautions Precautions: Back,Fall Precaution Booklet Issued: No Precaution Comments: pt with 1 fall at home after 1st surgery on 02/11/20 Restrictions Weight Bearing Restrictions: No    Has the patient had 2 or more falls or a fall with injury in the past year? Yes   Prior Activity Level Community (5-7x/wk): Mod I with RW. Pain and spasms for a month pta   Prior Functional Level Self Care: Did the patient need help bathing, dressing, using the toilet or eating? Needed some help   Indoor Mobility: Did the patient need assistance with walking from room to room (with or without device)? Independent   Stairs: Did the patient need assistance with internal or external stairs (with or without device)? Independent   Functional Cognition: Did the patient need help planning regular tasks such as shopping or remembering to take medications? [place] / [place] Devices/Equipment: Cane (specify quad or straight),Walker (specify type) Home Equipment: Walker - 2 wheels,Walker - 4 wheels,Cane - single point,Grab bars - tub/shower,Bedside commode,Toilet riser   Prior Device Use: Indicate devices/aids used by the patient prior to current illness, exacerbation or injury? Walker   Current Functional Level Cognition   Overall Cognitive Status: Within Functional Limits for tasks assessed Orientation Level: Oriented X4 General Comments: anxious about falling    Extremity Assessment (includes Sensation/Coordination)   Upper Extremity Assessment: Generalized weakness  Lower Extremity Assessment: Defer to PT evaluation RLE Deficits / Details: AAROM WFL; limited due to spasms/cramping LLE Deficits / Details: AAROM WFL; limited due to spasms/cramping     ADLs   Overall ADL's : Needs assistance/impaired Eating/Feeding: Modified  independent,Bed level Eating/Feeding Details (indicate cue type and reason): pt reports inability to sit up to eat Grooming: Wash/dry hands,Set up,Sitting Grooming Details (indicate cue type and reason): wash cloth used Lower Body Bathing: Total assistance Lower Body Dressing: Maximal assistance,Sit to/from stand Lower Body Dressing Details (indicate cue type and reason): don socks. demonstrates knee flexion but unable to cross Toilet Transfer: Moderate assistance,Ambulation,BSC,RW Toilet Transfer Details (indicate cue type and reason): cues for hand placement and anterior weight shift Toileting- Clothing Manipulation and Hygiene: Moderate assistance,Sitting/lateral lean Toileting - Clothing Manipulation Details (indicate cue type and reason): pt able to complete peri care with lateral lean Functional mobility during ADLs: Minimal assistance,Rolling walker General ADL Comments: pt reports supine since Friday and not oob over weekend. pt eager to get OOB this session. pt encouraged to get staff to get her up daily even if with steady to decrease fall risk. pt reporting no males present. pt educated that female staff can help with DME if that makes her more confident     Mobility   Overal bed mobility: Needs Assistance Bed Mobility: Supine to Sit,Rolling Rolling: Min assist Sidelying to sit: Min assist Supine to sit: Min assist Sit to supine: Mod assist Sit to sidelying: Mod assist General bed mobility comments: pt rolling R and L then R and then L before supine to sit with min (A). pt without pain during transfer compared to  previous session. pt able to static sit at normal height without spasm     Transfers   Overall transfer level: Needs assistance Equipment used: Rolling walker (2 wheeled) Transfers: Sit to/from Stand Sit to Stand: +2 physical assistance,Min assist General transfer comment: first attempt from bed total +2 min (A) pt able to progress to Mod (A) with additional attempts  during session.     Ambulation / Gait / Stairs / Wheelchair Mobility   Ambulation/Gait Ambulation/Gait assistance: Herbalist (Feet): 20 Feet (and then 26 feet, both with RW) Assistive device: Rolling walker (2 wheeled) Gait Pattern/deviations: Step-through pattern,Decreased stride length General Gait Details: pt generally steady, light use fo the RW,  wider based steps, pt still feeling tingling in her LE's and feet, so gait appears mildly uncoordinated.  Fully upright posture today. Gait velocity: slower Gait velocity interpretation: <1.8 ft/sec, indicate of risk for recurrent falls Wheelchair Mobility Wheelchair mobility:  (began discussion of use of w/c if home alone. Pt thinks this could be done and be helpful. She agrees sitting is painful AND she wants to minimize fall risk.)     Posture / Balance Dynamic Sitting Balance Sitting balance - Comments: Pt sat without need for UE's.  Much less tense and less anticipation of a spasm eminent., Balance Overall balance assessment: Needs assistance Sitting-balance support: Bilateral upper extremity supported,Feet supported Sitting balance-Leahy Scale: Fair Sitting balance - Comments: Pt sat without need for UE's.  Much less tense and less anticipation of a spasm eminent., Standing balance support: Bilateral upper extremity supported,During functional activity Standing balance-Leahy Scale: Poor Standing balance comment: reliant on RW     Special needs/care consideration COVID + 02/13/20 and has completed 10 days of airborne isolation Designated visitor is spouse, Herbie [place]    Previous Home Environment  Living Arrangements: Spouse/significant other  Lives With: Spouse Available Help at Discharge:  (spouse self employed Dealer, assist intermittently) Type of Home: House Home Layout: One level Home Access: Stairs to enter Entrance Stairs-Rails: None Technical brewer of Steps: 3 Bathroom Shower/Tub: Clinical cytogeneticist: Programmer, systems: Yes [place]: No Additional Comments: pt reports she will be home alone during the day   Discharge Living Setting Plans for Discharge Living Setting: Patient's home,Lives with (comment) (spouse) Type of Home at Discharge: House Discharge Home Layout: One level Discharge Home Access: Stairs to enter Entrance Stairs-Rails: None Entrance Stairs-Number of Steps: 3 Discharge Bathroom Shower/Tub: Walk-in shower Discharge Bathroom Toilet: Standard Discharge Bathroom Accessibility: Yes How Accessible: Accessible via walker Does the patient have any problems obtaining your medications?: No   Social/Family/Support Systems Patient Roles: Spouse Contact Information: spouse Herbie [place] [phone] Anticipated Caregiver: spouse Anticipated Caregiver's Contact Information: see above Ability/Limitations of Caregiver: spouse self employed days, prn assist Caregiver Availability: Intermittent Discharge Plan Discussed with Primary Caregiver: Yes Is Caregiver In Agreement with Plan?: Yes Does Caregiver/Family have Issues with Lodging/Transportation while Pt is in Rehab?: No   Goals Patient/Family Goal for Rehab: Mod I with PT and OT Expected length of stay: ELOS 7 to 10 days Pt/Family Agrees to Admission and willing to participate: Yes Program Orientation Provided & Reviewed with Pt/Caregiver Including Roles  & Responsibilities: Yes   Decrease burden of Care through IP rehab admission: n/a   Possible need for SNF placement upon discharge: not anticipated   Patient Condition: I have reviewed medical records from [facility] , spoken with CM, and patient. I met with patient at the bedside for inpatient rehabilitation assessment.  Patient will benefit from ongoing PT and OT, can actively participate in 3 hours of therapy a day 5 days of the week, and can make measurable gains during the admission.  Patient will also benefit from  the coordinated team approach during an Inpatient Acute Rehabilitation admission.  The patient will receive intensive therapy as well as Rehabilitation physician, nursing, social worker, and care management interventions.  Due to bladder management, bowel management, safety, skin/wound care, disease management, medication administration, pain management and patient education the patient requires 24 hour a day rehabilitation nursing.  The patient is currently min assist overall with mobility and basic ADLs.  Discharge setting and therapy post discharge at home with home health is anticipated.  Patient has agreed to participate in the Acute Inpatient Rehabilitation Program and will admit today.   Preadmission Screen Completed By:  Cleatrice Burke, 02/24/2020 10:31 AM ______________________________________________________________________   Discussed status with Dr. Dagoberto Ligas on 02/24/2020 at 1043 and received approval for admission today.   Admission Coordinator:  Cleatrice Burke, RN, time  2831 Date  02/24/2020    Assessment/Plan: Diagnosis: 1. Does the need for close, 24 hr/day Medical supervision in concert with the patient's rehab needs make it unreasonable for this patient to be served in a less intensive setting? Yes 2. Co-Morbidities requiring supervision/potential complications: COVID_ off precautions, RLE weakness and LLE weakness s/p microdiscectomy and open decompression 3. Due to bladder management, bowel management, safety, skin/wound care, disease management, medication administration, pain management and patient education, does the patient require 24 hr/day rehab nursing? Yes 4. Does the patient require coordinated care of a physician, rehab nurse, PT, OT, and SLP to address physical and functional deficits in the context of the above medical diagnosis(es)? Yes Addressing deficits in the following areas: balance, endurance, locomotion, strength, transferring, bathing, dressing,  feeding, grooming and toileting 5. Can the patient actively participate in an intensive therapy program of at least 3 hrs of therapy 5 days a week? Yes 6. The potential for patient to make measurable gains while on inpatient rehab is good 7. Anticipated functional outcomes upon discharge from inpatient rehab: modified independent PT, modified independent OT, n/a SLP 8. Estimated rehab length of stay to reach the above functional goals is: 7-10 days 9. Anticipated discharge destination: Home 10. Overall Rehab/Functional Prognosis: good     MD Signature:            Revision History                        Note Details  Jan Fireman, MD File Time 02/24/2020 10:55 AM  Author Type Physician Status Signed  Last Editor Carrie Heys, MD Service Physical Medicine and Rehabilitation

## 2020-02-24 NOTE — Discharge Instructions (Signed)
Discharge Instructions  No restriction in activities, slowly increase your activity back to normal.   Okay to shower on the day of discharge. Use regular soap and water and try to be gentle when cleaning your incision. No need for a dressing on your incision. In the first few days after surgery, there may be some bloody drainage from your wound. If this happens, you can cover your incision with a gauze dressing to prevent it from staining your clothes or bed linens. If your incision begins to itch, rub some bacitracin or neosporin ointment on it instead of scratching it.  Follow up with Dr. Janisse Ghan in 2 weeks after discharge. If you do not already have a discharge appointment, please call his office at [phone] to schedule a follow up appointment. If you have any concerns or questions, please call the office and let us know.   

## 2020-02-24 NOTE — Care Management Important Message (Signed)
Important Message  Patient Details  Name: Carrie Cook MRN: [id number] Date of Birth: 31-Aug-1960   Medicare Important Message Given:  Yes     Orbie Pyo 02/24/2020, 2:50 PM

## 2020-02-24 NOTE — Progress Notes (Signed)
Inpatient Rehabilitation  Patient information reviewed and entered into eRehab system by Aleshia Cartelli M. Yzabella Crunk, M.A., CCC/SLP, PPS Coordinator.  Information including medical coding, functional ability and quality indicators will be reviewed and updated through discharge.    

## 2020-02-24 NOTE — Discharge Summary (Signed)
Discharge Summary  Date of Admission: 02/13/2020  Date of Discharge: 02/24/20  Attending Physician: Emelda Brothers, MD  Hospital Course: Patient was admitted for worsening left lower extremity function after prior decompression. MRI showed worsening stenosis, she went to the OR with Dr. Kathyrn Sheriff for open decompression. Post-op, she had improvement in her pain and weakness, but had severe and frequent muscle spasms. These were refractory to multiple medications, but ultimately responsive to baclofen. Her hospital course was notable for an asymptomatic +Covid-19 result and she completed 10d of respiratory complications. She never developed symptoms from Covid and was discharged to CIR on 02/24/20. She will follow up in clinic with me in 2 weeks.  Neurologic exam at discharge:  AOx3, PERRL, EOMI, FS, TM Strength 5/5 x4 except L EHL/DF 4-/5, SILTx4  Discharge diagnosis: Lumbar stenosis  Judith Part, MD 02/24/20 10:27 AM

## 2020-02-24 NOTE — Progress Notes (Signed)
Neurosurgery Service Progress Note  Subjective: No acute events overnight, no new complaints, sitting up in a chair today  Objective: Vitals:   02/24/20 0005 02/24/20 0226 02/24/20 0412 02/24/20 0808  BP: 99/71 120/81 123/74 139/73  Pulse: 91  80 77  Resp: 20  20 20   Temp: 98.2 F (36.8 C)  98.2 F (36.8 C) 98.2 F (36.8 C)  TempSrc: Oral  Oral Oral  SpO2: 95%  96% 98%  Weight:      Height:        Physical Exam: AOx3, PERRL, EOMI, FS, TM, Strength 5/5 except 4- to 4/5 in L EHL/DF Incision c/d/i  Assessment & Plan: 59 y.o. woman s/p MIS L L3-4/4-5 microdisc with resolution of preop symptoms and post-op delayed new right / contralateral weakness with worsening of canal stenosis s/p open L3-4 / 4-5 decompression. +COVID asymptomatic, now off precautions  -sig muscle spasms persist, they have been unresponsive to diazepam, cyclobenzaprine, tizanidine, methocarbamol, improvement w/ baclofen, cont baclofen -SCDs/TEDs, SQH -awaiting CIR bed  Judith Part  02/24/20 10:05 AM

## 2020-02-24 NOTE — Progress Notes (Signed)
Suicide Risk Assessment completed. Patient reports no suicidual thoughts at present time. Silvestre Mesi PA notified of result score and patient's answers. Advised no precautions needed at this time. Continue with current plan of care/monitor.

## 2020-02-24 NOTE — Progress Notes (Signed)
Contact isolation discontinued, pt past window for +COvid isolation.    1/21 (+) Covid  2/1 asymptomatic, VSS. Isolation D/c'ed. Patient updated. rec'd tp to continue to wear mask and follow CDC rec'd of COVID19

## 2020-02-24 NOTE — Plan of Care (Signed)
  Problem: Consults Goal: RH GENERAL PATIENT EDUCATION Description: See Patient Education module for education specifics. 02/24/2020 2343 by Buck Mam, RN Outcome: Progressing 02/24/2020 2342 by Buck Mam, RN Outcome: Progressing Goal: Skin Care Protocol Initiated - if Braden Score 18 or less Description: If consults are not indicated, leave blank or document N/A 02/24/2020 2343 by Buck Mam, RN Outcome: Progressing 02/24/2020 2342 by Buck Mam, RN Outcome: Progressing Goal: Nutrition Consult-if indicated 02/24/2020 2343 by Buck Mam, RN Outcome: Progressing 02/24/2020 2342 by Buck Mam, RN Outcome: Progressing Goal: Diabetes Guidelines if Diabetic/Glucose > 140 Description: If diabetic or lab glucose is > 140 mg/dl - Initiate Diabetes/Hyperglycemia Guidelines & Document Interventions  02/24/2020 2343 by Buck Mam, RN Outcome: Progressing 02/24/2020 2342 by Buck Mam, RN Outcome: Progressing   Problem: RH BOWEL ELIMINATION Goal: RH STG MANAGE BOWEL WITH ASSISTANCE Description: STG Manage Bowel with Assistance. 02/24/2020 2343 by Buck Mam, RN Outcome: Progressing 02/24/2020 2342 by Buck Mam, RN Outcome: Progressing Goal: RH STG MANAGE BOWEL W/MEDICATION W/ASSISTANCE Description: STG Manage Bowel with Medication with Assistance. 02/24/2020 2343 by Buck Mam, RN Outcome: Progressing 02/24/2020 2342 by Buck Mam, RN Outcome: Progressing   Problem: RH BLADDER ELIMINATION Goal: RH STG MANAGE BLADDER WITH ASSISTANCE Description: STG Manage Bladder With Assistance 02/24/2020 2343 by Buck Mam, RN Outcome: Progressing 02/24/2020 2342 by Buck Mam, RN Outcome: Progressing Goal: RH STG MANAGE BLADDER WITH EQUIPMENT WITH ASSISTANCE Description: STG Manage Bladder With Equipment With Assistance 02/24/2020 2343 by Buck Mam, RN Outcome: Progressing 02/24/2020  2342 by Buck Mam, RN Outcome: Progressing   Problem: RH SKIN INTEGRITY Goal: RH STG SKIN FREE OF INFECTION/BREAKDOWN 02/24/2020 2343 by Buck Mam, RN Outcome: Progressing 02/24/2020 2342 by Buck Mam, RN Outcome: Progressing Goal: RH STG ABLE TO PERFORM INCISION/WOUND CARE W/ASSISTANCE Description: STG Able To Perform Incision/Wound Care With Assistance. 02/24/2020 2343 by Buck Mam, RN Outcome: Progressing 02/24/2020 2342 by Buck Mam, RN Outcome: Progressing   Problem: RH SAFETY Goal: RH STG ADHERE TO SAFETY PRECAUTIONS W/ASSISTANCE/DEVICE Description: STG Adhere to Safety Precautions With Assistance/Device. 02/24/2020 2343 by Buck Mam, RN Outcome: Progressing 02/24/2020 2342 by Buck Mam, RN Outcome: Progressing Goal: RH STG DECREASED RISK OF FALL WITH ASSISTANCE Description: STG Decreased Risk of Fall With Assistance. 02/24/2020 2343 by Buck Mam, RN Outcome: Progressing 02/24/2020 2342 by Buck Mam, RN Outcome: Progressing   Problem: RH PAIN MANAGEMENT Goal: RH STG PAIN MANAGED AT OR BELOW PT'S PAIN GOAL 02/24/2020 2343 by Buck Mam, RN Outcome: Progressing 02/24/2020 2342 by Buck Mam, RN Outcome: Progressing   Problem: RH KNOWLEDGE DEFICIT GENERAL Goal: RH STG INCREASE KNOWLEDGE OF SELF CARE AFTER HOSPITALIZATION 02/24/2020 2343 by Buck Mam, RN Outcome: Progressing 02/24/2020 2342 by Buck Mam, RN Outcome: Progressing

## 2020-02-24 NOTE — Progress Notes (Signed)
Inpatient Rehabilitation Medication Review by a Pharmacist  A complete drug regimen review was completed for this patient to identify any potential clinically significant medication issues.  Clinically significant medication issues were identified:  Yes.   Type of Medication Issue Identified Description of Issue Urgent (address now) Non-Urgent (address on AM team rounds) Plan Plan Accepted by Provider? (Yes / No / Pending AM Rounds)  Drug Interaction(s) (clinically significant)       Duplicate Therapy       Allergy       No Medication Administration End Date       Incorrect Dose       Additional Drug Therapy Needed  Abilify 5 mg daily. Non-urgent (address on AM team rounds) Pharmacist will check with dr to see if pt should be continued on this med. Pending AM rounds.  Other  Pt was also taking Flonose 2 puffs twice daily and a multivitamin but did not receive as an inpt.       For non-urgent medication issues to be resolved on team rounds tomorrow morning a [facility] Secure Chat Handoff was sent to: AM clinical pharmacist Janett Billow Carney).   Time spent performing this drug regimen review (minutes):  10 min.   Blenda Nicely 02/24/2020 10:13 PM

## 2020-02-24 NOTE — Progress Notes (Signed)
Physical Therapy Treatment Patient Details Name: Carrie Cook MRN: [id number] DOB: 08-08-1960 Today's Date: 02/24/2020    History of Present Illness 60 y.o. female s/p right L3-4, L4-5 decompression 02/11/20 with postop improvement in right foot drop and radiculopathy but new severe contralateral radiculopathy with foot drop. Pt reports fall x 1 at home after surgery due to spasms. 02/14/20 Bilateral L3-4, L4-5 laminotomy, medial facetectomy for decompression of thecal sac and nerve roots including microdiscectomy at L4-5    PT Comments    Pt tolerates treatment well despite reports of low back and glute soreness. Pt continues to demonstrate a preference for posterior lean when standing without UE support and experiences one posterior LOB during session requiring PT assistance to correct. Pt utilizes UE support to maintain balance when ambulating and is limited by muscular endurance and soreness. Pt will benefit from continued acute PT POC to improve LE strength and endurance while also reducing falls risk. PT continues to recommend CIR placement at this time.   Follow Up Recommendations  CIR     Equipment Recommendations  Rolling walker with 5" wheels    Recommendations for Other Services       Precautions / Restrictions Precautions Precautions: Back;Fall Precaution Booklet Issued: No Precaution Comments: Pt is able to verbalize back precautions but requires cues to maintain during session. pt with 1 fall at home after 1st surgery on 02/11/20 Required Braces or Orthoses:  (no brace needs per orders) Restrictions Weight Bearing Restrictions: No    Mobility  Bed Mobility               General bed mobility comments: pt received and left sitting in recliner  Transfers Overall transfer level: Needs assistance Equipment used: Rolling walker (2 wheeled) Transfers: Sit to/from Stand Sit to Stand: Min assist         General transfer comment: pt with posterior loss of  balance upon initial transfer attempt and requires PT assist to sit back in recliner.  Ambulation/Gait Ambulation/Gait assistance: Min assist Gait Distance (Feet): 50 Feet Assistive device: Rolling walker (2 wheeled) Gait Pattern/deviations: Step-to pattern Gait velocity: reduced Gait velocity interpretation: <1.8 ft/sec, indicate of risk for recurrent falls General Gait Details: pt with slowed step-to gait, increased anterior trunk lean by pt due to fear of posterior LOB   Stairs             Wheelchair Mobility    Modified Rankin (Stroke Patients Only)       Balance Overall balance assessment: Needs assistance Sitting-balance support: Single extremity supported;Feet supported Sitting balance-Leahy Scale: Poor Sitting balance - Comments: reliant on UE support of recliner   Standing balance support: Single extremity supported;Bilateral upper extremity supported;No upper extremity supported Standing balance-Leahy Scale: Poor Standing balance comment: reliant on UE support or minA   Single Leg Stance - Left Leg: 2       Rhomberg - Eyes Closed: 10 (with minG)   High Level Balance Comments: pt performs reaching activities outside her BOS in multiple directions first with unilateral UE support and then without UE support, minG provided for safety            Cognition Arousal/Alertness: Awake/alert Behavior During Therapy: WFL for tasks assessed/performed Overall Cognitive Status: Within Functional Limits for tasks assessed  Exercises Other Exercises Other Exercises: partial squats in standing with BUE support of RW x 10 reps    General Comments General comments (skin integrity, edema, etc.): VSS      Pertinent Vitals/Pain Pain Assessment: 0-10 Pain Score: 7  Pain Location: low back and glutes Pain Descriptors / Indicators: Sore Pain Intervention(s): Monitored during session    Home Living      Available Help at Discharge:  (spouse self employed Dealer, assist intermittently)                Prior Function            PT Goals (current goals can now be found in the care plan section) Acute Rehab PT Goals Patient Stated Goal: to get up more Progress towards PT goals: Progressing toward goals    Frequency    Min 5X/week      PT Plan Current plan remains appropriate    Co-evaluation              AM-PAC PT "6 Clicks" Mobility   Outcome Measure  Help needed turning from your back to your side while in a flat bed without using bedrails?: A Little Help needed moving from lying on your back to sitting on the side of a flat bed without using bedrails?: A Little Help needed moving to and from a bed to a chair (including a wheelchair)?: A Little Help needed standing up from a chair using your arms (e.g., wheelchair or bedside chair)?: A Little Help needed to walk in hospital room?: A Little Help needed climbing 3-5 steps with a railing? : A Lot 6 Click Score: 17    End of Session   Activity Tolerance: Patient tolerated treatment well Patient left: in chair;with call bell/phone within reach;with chair alarm set Nurse Communication: Mobility status PT Visit Diagnosis: Other abnormalities of gait and mobility (R26.89);History of falling (Z91.81);Difficulty in walking, not elsewhere classified (R26.2);Pain Pain - part of body:  (low back)     Time: 2297-9892 PT Time Calculation (min) (ACUTE ONLY): 19 min  Charges:  $Gait Training: 8-22 mins                     Zenaida Niece, PT, DPT Acute Rehabilitation Pager: [phone]    Zenaida Niece 02/24/2020, 10:43 AM

## 2020-02-24 NOTE — Progress Notes (Signed)
Patient admitted to [phone]. All belongings at bedside. Patient oriented to rehab process/safety plan and reviewed visitor policy. All questions answered. Call bell left within reach

## 2020-02-25 DIAGNOSIS — M5416 Radiculopathy, lumbar region: Secondary | ICD-10-CM

## 2020-02-25 DIAGNOSIS — K5903 Drug induced constipation: Secondary | ICD-10-CM

## 2020-02-25 DIAGNOSIS — E8809 Other disorders of plasma-protein metabolism, not elsewhere classified: Secondary | ICD-10-CM | POA: Diagnosis not present

## 2020-02-25 DIAGNOSIS — M48061 Spinal stenosis, lumbar region without neurogenic claudication: Secondary | ICD-10-CM

## 2020-02-25 DIAGNOSIS — G992 Myelopathy in diseases classified elsewhere: Secondary | ICD-10-CM

## 2020-02-25 DIAGNOSIS — I10 Essential (primary) hypertension: Secondary | ICD-10-CM

## 2020-02-25 DIAGNOSIS — M5106 Intervertebral disc disorders with myelopathy, lumbar region: Secondary | ICD-10-CM | POA: Diagnosis not present

## 2020-02-25 DIAGNOSIS — G8918 Other acute postprocedural pain: Secondary | ICD-10-CM

## 2020-02-25 DIAGNOSIS — M62838 Other muscle spasm: Secondary | ICD-10-CM | POA: Diagnosis not present

## 2020-02-25 DIAGNOSIS — E46 Unspecified protein-calorie malnutrition: Secondary | ICD-10-CM

## 2020-02-25 DIAGNOSIS — E1165 Type 2 diabetes mellitus with hyperglycemia: Secondary | ICD-10-CM

## 2020-02-25 LAB — CBC WITH DIFFERENTIAL/PLATELET
Abs Immature Granulocytes: 0.13 10*3/uL — ABNORMAL HIGH (ref 0.00–0.07)
Basophils Absolute: 0.1 10*3/uL (ref 0.0–0.1)
Basophils Relative: 1 %
Eosinophils Absolute: 0.6 10*3/uL — ABNORMAL HIGH (ref 0.0–0.5)
Eosinophils Relative: 6 %
HCT: 38.8 % (ref 36.0–46.0)
Hemoglobin: 12.9 g/dL (ref 12.0–15.0)
Immature Granulocytes: 1 %
Lymphocytes Relative: 25 %
Lymphs Abs: 2.3 10*3/uL (ref 0.7–4.0)
MCH: 31.2 pg (ref 26.0–34.0)
MCHC: 33.2 g/dL (ref 30.0–36.0)
MCV: 93.7 fL (ref 80.0–100.0)
Monocytes Absolute: 1 10*3/uL (ref 0.1–1.0)
Monocytes Relative: 11 %
Neutro Abs: 5.1 10*3/uL (ref 1.7–7.7)
Neutrophils Relative %: 56 %
Platelets: 328 10*3/uL (ref 150–400)
RBC: 4.14 MIL/uL (ref 3.87–5.11)
RDW: 11.4 % — ABNORMAL LOW (ref 11.5–15.5)
WBC: 9.1 10*3/uL (ref 4.0–10.5)
nRBC: 0 % (ref 0.0–0.2)

## 2020-02-25 LAB — COMPREHENSIVE METABOLIC PANEL
ALT: 17 U/L (ref 0–44)
AST: 17 U/L (ref 15–41)
Albumin: 2.9 g/dL — ABNORMAL LOW (ref 3.5–5.0)
Alkaline Phosphatase: 80 U/L (ref 38–126)
Anion gap: 10 (ref 5–15)
BUN: 10 mg/dL (ref 6–20)
CO2: 28 mmol/L (ref 22–32)
Calcium: 9 mg/dL (ref 8.9–10.3)
Chloride: 99 mmol/L (ref 98–111)
Creatinine, Ser: 0.85 mg/dL (ref 0.44–1.00)
GFR, Estimated: >60 mL/min (ref >60)
Glucose, Bld: 130 mg/dL — ABNORMAL HIGH (ref 70–99)
Potassium: 4.5 mmol/L (ref 3.5–5.1)
Sodium: 137 mmol/L (ref 135–145)
Total Bilirubin: 0.6 mg/dL (ref 0.3–1.2)
Total Protein: 5.8 g/dL — ABNORMAL LOW (ref 6.5–8.1)

## 2020-02-25 LAB — GLUCOSE, CAPILLARY
Glucose-Capillary: 136 mg/dL — ABNORMAL HIGH (ref 70–99)
Glucose-Capillary: 142 mg/dL — ABNORMAL HIGH (ref 70–99)
Glucose-Capillary: 123 mg/dL — ABNORMAL HIGH (ref 70–99)
Glucose-Capillary: 119 mg/dL — ABNORMAL HIGH (ref 70–99)

## 2020-02-25 NOTE — Patient Care Conference (Signed)
Inpatient RehabilitationTeam Conference and Plan of Care Update Date: 02/25/2020   Time: 11:38 AM    Patient Name: Carrie Cook      Medical Record Number: [id number]  Date of Birth: 19-Dec-1960 Sex: Female         Room/Bed: 4W04C/4W04C-01 Payor Info: Payor: MEDICARE / Plan: MEDICARE PART A AND B / Product Type: *No Product type* /    Admit Date/Time:  02/24/2020  3:38 PM  Primary Diagnosis:  Myelopathy concurrent with and due to stenosis of lumbar spine [facility])  Hospital Problems: Principal Problem:   Myelopathy concurrent with and due to stenosis of lumbar spine ([place]) Active Problems:   Lumbar radiculopathy   Lumbar disc herniation with myelopathy   Hypoalbuminemia due to protein-calorie malnutrition ([place])   Muscle spasm   Controlled type 2 diabetes mellitus with hyperglycemia, without long-term current use of insulin ([place])   Essential hypertension   Drug induced constipation   Postoperative pain    Expected Discharge Date: Expected Discharge Date:  (ELOS 10 days)  Team Members Present: Physician leading conference: Dr. Delice Lesch Care Coodinator Present: Dorien Chihuahua, RN, BSN, CRRN;Loralee Pacas, LCSWA Nurse Present: Pamella Pert) [place], LPN PT Present: Becky Sax, PT OT Present: Simonne Come, OT PPS Coordinator present : Gunnar Fusi, Novella Olive, PT     Current Status/Progress Goal Weekly Team Focus  Bowel/Bladder   pt continent x2, LBM 1/30 Pt has scheduled and prn laxatives.  Pt will remain continent throughout stay.  q2 toileting, assess bowel sounds and LBM, give prn meds for constipation   Swallow/Nutrition/ Hydration             ADL's   Min A ambulatory transfers with RW, Min A LB bathing/dressing, CGA UB bathing/dressing.  Pt reports "spasms" in BLE, fearful of falling  Mod I overall, Supervision shower transfers  ADL retraining, functional transfers, dynamic standing balance, endurance, LB dressing   Mobility             Communication              Safety/Cognition/ Behavioral Observations            Pain   Pt pain level 8 out of 10 in the lower back right side.  Pt pain level will be 3 out of 10  assess pain per shift and prn, give prn pain meds, reposition pt   Skin   Pt has pink rash on thigh and sacrum area that itches, incisional area on back from surgery  Pt will not develop any new skin breakdown  assess skin qshift, educated pt on prevention of skin breakdown, Gerhard's butt paste for bottom area     Discharge Planning:  To be assessed. Per EMR pt to have PRN assistance from husband since he works during the day.   Team Discussion: Orthostasis issues; nausea, constipation and back pain limiting progress.  Patient on target to meet rehab goals: yes, mod I goals with supervision for showering  *See Care Plan and progress notes for long and short-term goals.   Revisions to Treatment Plan:   Teaching Needs: Transfers, toileting, back precautions, medications, incision care, etc  Current Barriers to Discharge: Decreased caregiver support and insurance coverage for recommended DME  Spouse works; can only provide intermittent assist  Possible Resolutions to Barriers: Family education with spouse Review DME recommendations/shower equipment recommendations with patient and spouse    Medical Summary Current Status: L foot drop and B/L LE weakness with impaired function/ADLs/mobility s/p microdiscectomy and  open decompression (L3-L5) secondary to Lumbar stenosis that was progressive/causing L foot drop.  Barriers to Discharge: Medical stability;Decreased family/caregiver support   Possible Resolutions to [employer] Focus: Therapies, optimize BP/DM meds/bowel meds/pain meds   Continued Need for Acute Rehabilitation Level of Care: The patient requires daily medical management by a physician with specialized training in physical medicine and rehabilitation for the following reasons: Direction of a  multidisciplinary physical rehabilitation program to maximize functional independence : Yes Medical management of patient stability for increased activity during participation in an intensive rehabilitation regime.: Yes Analysis of laboratory values and/or radiology reports with any subsequent need for medication adjustment and/or medical intervention. : Yes   I attest that I was present, lead the team conference, and concur with the assessment and plan of the team.   Dorien Chihuahua B 02/25/2020, 12:56 PM

## 2020-02-25 NOTE — Progress Notes (Addendum)
UPDATE: Inpatient Rehabilitation Medication Review by a Pharmacist  A complete drug regimen review was completed for this patient to identify any potential clinically significant medication issues.  Clinically significant medication issues were identified:   NO   Type of Medication Issue Identified Description of Issue Urgent (address now) Non-Urgent (address on AM team rounds) Plan Plan Accepted by Provider? (Yes / No / Pending AM Rounds)  Drug Interaction(s) (clinically significant)       Duplicate Therapy       Allergy       No Medication Administration End Date       Incorrect Dose       Additional Drug Therapy Needed  Abilify 5 mg daily. -Update: On review of Rehab H&P patient no longer taking Abilify and does not want. Refused all inpatient doses.  Non-urgent (address on AM team rounds) Pharmacist closing [facility] as MD clearly stated plan.  No follow-up needed.   Other  Pt was also taking Flonose 2 puffs twice daily and a multivitamin but did not receive as an inpt.       For non-urgent medication issues to be resolved on team rounds tomorrow morning a [facility] Secure Chat Handoff was sent to: AM clinical pharmacist Janett Billow Carney).   Time spent performing this drug regimen review (minutes):  10 min.   Brain Hilts 02/25/2020 9:19 AM

## 2020-02-25 NOTE — Evaluation (Signed)
Physical Therapy Assessment and Plan  Patient Details  Name: ADELAE YODICE MRN: [id number] Date of Birth: 1960/03/11  PT Diagnosis: Abnormal posture, Abnormality of gait, Difficulty walking, Impaired sensation, Low back pain, Muscle spasms and Muscle weakness Rehab Potential: Good ELOS: 8-10 days   Today's Date: 02/25/2020 PT Individual Time: 1300-1408 PT Individual Time Calculation (min): 68 min    Hospital Problem: Principal Problem:   Myelopathy concurrent with and due to stenosis of lumbar spine ([place]) Active Problems:   Lumbar radiculopathy   Lumbar disc herniation with myelopathy   Hypoalbuminemia due to protein-calorie malnutrition ([place])   Muscle spasm   Controlled type 2 diabetes mellitus with hyperglycemia, without long-term current use of insulin (HCC)   Essential hypertension   Drug induced constipation   Postoperative pain   Past Medical History:  Past Medical History:  Diagnosis Date  . Anemia    PT HAS HAD COLONOSCOPY AND ENDOSCOPY WORK UP - NO PROBLEMS FOUND AS SOURCE OF ANEMIA -- PT HAD IRON INFUSION AT [facility] 11/13/12-AFTER SEEING HEMATOLOGIST DR. [place] AND HE GAVE HEMATOLOGIC CLEARANCE FOR GASTRIC BYPASS SURGERY.  Marland Kitchen Anxiety   . Asthma    daily and prn inhalers  . Degenerative joint disease    right knee, spine - STATES INTERMITTENT  NUMBNESS DOWN RT LEG WITH PROLONGED STANDING OR WALKING- THINKS RELATED TO HER SPINE PROBLEMS  . Dental crowns present   . Depression   . Family history of anesthesia complication    pt's mother and sister have hx. of post-op N/V  . Fibroids    UTERINE  . Gastroesophageal reflux disease    none for 2 years since Bariatric surgery.  . H/O hiatal hernia   . History of endometriosis   . History of kidney stones   . Hyperlipidemia   . Hypertension    under control with med., has been on med. x 2 yr.  . Insulin dependent diabetes mellitus   . Lock jaw    jaw locks open if opens mouth wide  . Migraines   . Neuropathy     FEET  . Obesity   . Palpitations   . PONV (postoperative nausea and vomiting)   . Shortness of breath    with daily activities  . Sleep apnea    post-op didn't need CPAP but now states needs it again but not using   Past Surgical History:  Past Surgical History:  Procedure Laterality Date  . ABDOMINAL HYSTERECTOMY N/A 06/06/2017   Procedure: HYSTERECTOMY ABDOMINAL;  Surgeon: Florian Buff, MD;  Location: [facility];  Service: Gynecology;  Laterality: N/A;  . BACK SURGERY    . BREATH TEK H PYLORI N/A 08/13/2012   Procedure: BREATH TEK H PYLORI;  Surgeon: Edward Jolly, MD;  Location: [facility];  Service: General;  Laterality: N/A;  . CARPAL TUNNEL RELEASE  01/03/2012   Procedure: CARPAL TUNNEL RELEASE;  Surgeon: Wynonia Sours, MD;  Location: [facility];  Service: Orthopedics;  Laterality: Right;  . carpal tunnel release right hand Right 12/13  . COLONOSCOPY  05/2009   DJS:[id number] hemorrhoids otherwise normal colon, rectum and terminal ileum  . COLONOSCOPY WITH ESOPHAGOGASTRODUODENOSCOPY (EGD) N/A 10/28/2012   Procedure: COLONOSCOPY WITH ESOPHAGOGASTRODUODENOSCOPY (EGD);  Surgeon: Daneil Dolin, MD;  [facility];  Service: Endoscopy;  Laterality: N/A;  7:30  . DILITATION & CURRETTAGE/HYSTROSCOPY WITH NOVASURE ABLATION N/A 07/22/2014   Procedure: DILATATION & CURETTAGE/HYSTEROSCOPY WITH NOVASURE ABLATION; uterine length 6.0 cm; uterine width 3.8 cm;  total ablation time 1 minute 15 seconds;  Surgeon: Florian Buff, MD;  Location: [facility];  Service: Gynecology;  Laterality: N/A;  . ESOPHAGOGASTRODUODENOSCOPY  5/013/2011   KNL:ZJQBHA esophagus/multiple polyps removed s/p (hyperplastic). Gastritis without H.pylori.  . ESOPHAGOGASTRODUODENOSCOPY (EGD) WITH PROPOFOL N/A 06/06/2018   with LA Grade A esophagitis s/p dilation.   Marland Kitchen GASTRIC ROUX-EN-Y N/A 11/19/2012   Procedure: LAPAROSCOPIC ROUX-EN-Y GASTRIC;  Surgeon: Edward Jolly, MD;  Location: [facility];   Service: General;  Laterality: N/A;  . GIVENS CAPSULE STUDY N/A 10/28/2012   Procedure: GIVENS CAPSULE STUDY;  Surgeon: Daneil Dolin, MD;  [facility];  Service: Endoscopy;  Laterality: N/A;  . KNEE ARTHROSCOPY WITH MEDIAL MENISECTOMY Right 10/28/2015   Procedure: RIGHT KNEE ARTHROSCOPY WITH MEDIAL MENISECTOMY;  Surgeon: Carole Civil, MD;  [facility];  Service: Orthopedics;  Laterality: Right;  . LAPAROSCOPY     due to endometriosis  . LUMBAR LAMINECTOMY/DECOMPRESSION MICRODISCECTOMY N/A 02/14/2020   Procedure: LUMBAR LAMINECTOMY  Lumbar three-four, Lumbar four-five;  Surgeon: Consuella Lose, MD;  Location: [facility];  Service: Neurosurgery;  Laterality: N/A;  . Venia Minks DILATION N/A 06/06/2018   Procedure: Venia Minks DILATION;  Surgeon: Daneil Dolin, MD;  [facility];  Service: Endoscopy;  Laterality: N/A;  . PELVIC LAPAROSCOPY  11/04/1999   with fulguration of endometriosis  . SALPINGOOPHORECTOMY Bilateral 06/06/2017   Procedure: SALPINGO OOPHORECTOMY;  Surgeon: Florian Buff, MD;  [facility];  Service: Gynecology;  Laterality: Bilateral;  . TOTAL KNEE ARTHROPLASTY Right 07/22/2019   Procedure: TOTAL KNEE ARTHROPLASTY;  Surgeon: Carole Civil, MD;  Location[facility];  Service: Orthopedics;  Laterality: Right;  . TRIGGER FINGER RELEASE Right 09/24/2012   Procedure: RELEASE A-1 PULLEY OF RIGHT THUMB;  Surgeon: Wynonia Sours, MD;  Location: [place];  Service: Orthopedics;  Laterality: Right;  . TUBAL LIGATION  1993    Assessment & Plan Clinical Impression: Patient is a 60 y.o. year old female with history of HTN, T2DM, R-TKR, RLE pain w Work up revealing moderate to severe spinal stenosis L3-4. She underwent L3/4 and L4/5 decompression on 01/19 at surgical center by Dr. Zada Finders. She was readmitted 01/21 due to new onset of left foot drop, severe muscle spasms, severe pain and fall. Marland Kitchen MRI spine done revealing severe spinal canal  narrowing at L3-L5 levels with increased conspicuity of epidural fat--question postprocedural edema contributing to stenosis. She underwent bilateral L3/4 and L4/5 facetectomy with decompression of thecal nerve roots. Post op neuropathy and LLE weakness improving but has had severe muscle spasms requiring multiple medication adjustment. She was also found to be Covid + but has been asymptomatic and has been on isolation. Isolation removed 02/23/20 after 10 days of airborne isolation.   Patient currently requires mod with mobility secondary to muscle weakness, decreased cardiorespiratoy endurance and decreased standing balance, decreased postural control and decreased balance strategies.  Prior to hospitalization, patient was modified independent  with mobility and lived with Spouse in a House home.  Home access is 3 in front with no rails, 7 in back with bilateral rails (can reach both)Stairs to enter.  Patient will benefit from skilled PT intervention to maximize safe functional mobility, minimize fall risk and decrease caregiver burden for planned discharge home with intermittent assist.  Anticipate patient will benefit from follow up [facility] at discharge.  PT - End of Session Activity Tolerance: Tolerates 30+ min activity with multiple rests Endurance Deficit: Yes Endurance Deficit Description: demonstrated  SOB with activity PT Assessment Rehab Potential (ACUTE/IP ONLY): Good PT Barriers to Discharge: Inaccessible home environment;Decreased caregiver support;Home environment access/layout PT Barriers to Discharge Comments: 3 STE along front entrance with 0 rails and 7 STE along back entrance with 2 rails; husband works and can only provide intermittent assist PT Patient demonstrates impairments in the following area(s): Balance;Endurance;Motor;Pain;Sensory PT Transfers Functional Problem(s): Bed Mobility;Bed to Chair;Car;Furniture PT Locomotion Functional Problem(s): Ambulation;Wheelchair  Mobility;Stairs PT Plan PT Intensity: Minimum of 1-2 x/day ,45 to 90 minutes PT Frequency: 5 out of 7 days PT Duration Estimated Length of Stay: 8-10 days PT Treatment/Interventions: Ambulation/gait training;Discharge planning;Functional mobility training;Psychosocial support;Therapeutic Activities;Balance/vestibular training;Disease management/prevention;Neuromuscular re-education;Skin care/wound management;Therapeutic Exercise;Wheelchair propulsion/positioning;Cognitive remediation/compensation;DME/adaptive equipment instruction;Splinting/orthotics;Pain management;UE/LE Strength taining/ROM;Community reintegration;Functional electrical stimulation;Patient/family education;Stair training;UE/LE Coordination activities PT Transfers Anticipated Outcome(s): Mod I PT Locomotion Anticipated Outcome(s): Mod I PT Recommendation Follow Up Recommendations: Home health PT Patient destination: Home Equipment Recommended: To be determined Equipment Details: has RW, rollator, and WC  PT Evaluation Precautions/Restrictions Precautions Precautions: Back;Fall Precaution Comments: Pt is able to verbalize back precautions but requires cues to maintain during session Restrictions Weight Bearing Restrictions: No Home Living/Prior Functioning Home Living Available Help at Discharge: Available PRN/intermittently (spouse self-employed Dealer, can provide intermittent assist) Type of Home: House Home Access: Stairs to enter [employer] of Steps: 3 in front with no rails, 7 in back with bilateral rails (can reach both) Entrance Stairs-Rails: None Home Layout: One level Bathroom Shower/Tub: Multimedia programmer: Standard Bathroom Accessibility: Yes Additional Comments: pt reports she will be home alone during the day  Lives With: Spouse Prior Function Level of Independence: Requires assistive device for independence;Independent with basic ADLs  Able to Take Stairs?: Yes Driving:  No Vocation: On disability Cognition Overall Cognitive Status: Within Functional Limits for tasks assessed Arousal/Alertness: Awake/alert Orientation Level: Oriented X4 Memory: Appears intact Awareness: Appears intact Problem Solving: Appears intact Safety/Judgment: Appears intact Sensation Sensation Light Touch: Impaired by gross assessment Proprioception: Appears Intact Additional Comments: pt reports decreased sensation along RLE and "numbness" and "pins and needles" Coordination Gross Motor Movements are Fluid and Coordinated: No Fine Motor Movements are Fluid and Coordinated: Yes Coordination and Movement Description: grossly uncoordinated due to fatigue, generalized weakness, decreased balance/postural control, and low back pain Finger Nose Finger Test: [facility] bilaterally Heel Shin Test: [facility] bilaterally Motor  Motor Motor: Within Functional Limits Motor - Skilled Clinical Observations: fatigue, generalized weakness, decreased balance/postural control, and low back pain  Trunk/Postural Assessment  Cervical Assessment Cervical Assessment: Within Functional Limits Thoracic Assessment Thoracic Assessment: Within Functional Limits Lumbar Assessment Lumbar Assessment: Exceptions to [facility] (posterior pelvic tilt) Postural Control Postural Control: Deficits on evaluation  Balance Balance Balance Assessed: Yes Standardized Balance Assessment Standardized Balance Assessment: Timed Up and Go Test Timed Up and Go Test TUG: Normal TUG Normal TUG (seconds): 40 Static Sitting Balance Static Sitting - Balance Support: Feet supported;Bilateral upper extremity supported Static Sitting - Level of Assistance: 5: Stand by assistance (supervision) Dynamic Sitting Balance Dynamic Sitting - Balance Support: Feet supported;No upper extremity supported Dynamic Sitting - Level of Assistance: 5: Stand by assistance (supervision) Static Standing Balance Static Standing - Balance Support: No upper  extremity supported Static Standing - Level of Assistance: 4: Min assist Dynamic Standing Balance Dynamic Standing - Balance Support: No upper extremity supported Dynamic Standing - Level of Assistance: 3: Mod assist Extremity Assessment  RLE Assessment RLE Assessment: Exceptions to [facility] General Strength Comments: grossly generalized to 4-/5 LLE Assessment LLE Assessment: Exceptions to [place]  Strength Comments: grossly generalized to 4-/5  Care Tool Care Tool Bed Mobility Roll left and right activity   Roll left and right assist level: Supervision/Verbal cueing    Sit to lying activity   Sit to lying assist level: Supervision/Verbal cueing    Lying to sitting edge of bed activity   Lying to sitting edge of bed assist level: Supervision/Verbal cueing     Care Tool Transfers Sit to stand transfer   Sit to stand assist level: Moderate Assistance - Patient 50 - 74%    Chair/bed transfer   Chair/bed transfer assist level: Moderate Assistance - Patient 50 - 74%     Physiological scientist transfer assist level: Moderate Assistance - Patient 50 - 74%      Care Tool Locomotion Ambulation   Assist level: Minimal Assistance - Patient > 75% Assistive device: Walker-rolling Max distance: 53f  Walk 10 feet activity   Assist level: Minimal Assistance - Patient > 75% Assistive device: Walker-rolling   Walk 50 feet with 2 turns activity   Assist level: Minimal Assistance - Patient > 75% Assistive device: Walker-rolling  Walk 150 feet activity Walk 150 feet activity did not occur: Safety/medical concerns (fatigue, generalized weakness, decreased balance)      Walk 10 feet on uneven surfaces activity   Assist level: Moderate Assistance - Patient - 50 - 74% Assistive device: Walker-rolling  Stairs   Assist level: Moderate Assistance - Patient - 50 - 74% Stairs assistive device: 2 hand rails Max number of stairs: 8  Walk up/down 1 step activity   Walk  up/down 1 step (curb) assist level: Moderate Assistance - Patient - 50 - 74% Walk up/down 1 step or curb assistive device: 2 hand rails    Walk up/down 4 steps activity Walk up/down 4 steps assist level: Moderate Assistance - Patient - 50 - 74% Walk up/down 4 steps assistive device: 2 hand rails  Walk up/down 12 steps activity Walk up/down 12 steps activity did not occur: Safety/medical concerns (fatigue, generalized weakness, decreased balance)      Pick up small objects from floor Pick up small object from the floor (from standing position) activity did not occur: Safety/medical concerns (back precautions)      Wheelchair Will patient use wheelchair at discharge?: No Type of Wheelchair: Manual   Wheelchair assist level: Supervision/Verbal cueing Max wheelchair distance: 1533f Wheel 50 feet with 2 turns activity   Assist Level: Supervision/Verbal cueing  Wheel 150 feet activity   Assist Level: Supervision/Verbal cueing    Refer to Care Plan for Long Term Goals  SHORT TERM GOAL WEEK 1 PT Short Term Goal 1 (Week 1): STG=LTG due to LOS  Recommendations for other services: None   Skilled Therapeutic Intervention Evaluation completed (see details above and below) with education on PT POC and goals and individual treatment initiated with focus on functional mobility/transfers, generalized strengthening, dynamic standing balance/coordination, ambulation, stair navigation, simulated car transfers, and improved activity tolerance. Received pt supine in bed, pt educated on PT evaluation, CIR policies, and therapy schedule, and agreeable. Pt reported 4/10 pain in low back (premedicated). Repositioning, rest breaks, and distraction done to reduce pain levels. Pt able to recall 3/3 back precautions but required min cues to maintain with functional mobility. Therapist provided pt with 18x18 manual WC and cushion and pt transferred supine<>sitting EOB with HOB elevated and use of bedrails and  supervision. Pt transferred bed<>WC stand<>pviot without AD  and mod A. Pt performed WC mobility 117f using BUE and supervision to therapy gym. Pt ambulated 72fwith RW and min A. Pt demonstrated bilateral knee flexion L>R, narrow BOS, decreased stride length and foot clearance, and decreased trunk rotation with gait. Pt navigated 8 steps with 2 rails and min/mod A ascending and descending with a step to pattern. Pt able to recall "up with the good, down with the bad" technique without cues. [facility] and performed simulated car transfer with min/mod A. Pt able to recognize safe entry into car sitting on buttocks and then getting her feet in. Pt ambulated 1067fn uneven surfaces (ramp) with RW and mod A overall. Pt with continued bilateral knee flexion and difficulty achieving full knee extension L>R. Pt performed TUG with RW and min A in 40 seconds. Pt educated on test results and significance indicating high fall risk. Pt reported 7/10 fatigue after ambulating. [facility] and [facility]<>Nustep with RW and min A. Pt performed BUE/LE strengthening on Nustep at workload 3 for 3 minutes for a total of 125 steps for improved cardiovascular endurance. Stopped due to reports of nausea but no episode of emesis. Pt transported back to [facility] in [facility] and transferred WC<>bed stand<>pivot with RW and min A and sit<>supine with supervision. Concluded session with pt supine in bed, needs within reach, and bed alarm on. Therapist provided pt with ginger ale and cool washcloth.   Mobility Bed Mobility Bed Mobility: Rolling Right;Rolling Left;Sit to Supine;Supine to Sit Rolling Right: Supervision/verbal cueing Rolling Left: Supervision/Verbal cueing Supine to Sit: Supervision/Verbal cueing Sit to Supine: Supervision/Verbal cueing Transfers Transfers: Sit to Stand;Stand to Sit;Stand Pivot Transfers Sit to Stand: Moderate Assistance - Patient  50-74% Stand to Sit: Minimal Assistance - Patient > 75% Stand Pivot Transfers: Moderate Assistance - Patient 50 - 74% Stand Pivot Transfer Details: Verbal cues for technique Stand Pivot Transfer Details (indicate cue type and reason): verbal cues for hand placement on WC armrests Transfer (Assistive device): None Locomotion  Gait Ambulation: Yes Gait Assistance: Minimal Assistance - Patient > 75% Gait Distance (Feet): 70 Feet Assistive device: Rolling walker Gait Assistance Details: Verbal cues for technique Gait Assistance Details: verbal cues to increase stride length for safety Gait Gait: Yes Gait Pattern: Impaired Gait Pattern: Decreased trunk rotation;Decreased stride length;Step-through pattern;Left flexed knee in stance;Right flexed knee in stance;Decreased step length - right;Decreased step length - left;Trunk flexed;Poor foot clearance - left;Poor foot clearance - right Gait velocity: decreased Stairs / Additional Locomotion Stairs: Yes Stairs Assistance: Moderate Assistance - Patient 50 - 74% Stair Management Technique: Two rails Number of Stairs: 8 Height of Stairs: 6 Ramp: Moderate Assistance - Patient 50 - 74% (RW) Wheelchair Mobility Wheelchair Mobility: Yes Wheelchair Assistance: SupChartered loss adjusteroth upper extremities Wheelchair Parts Management: Needs assistance Distance: [id number] Criteria: Patient will be discharged from PT if patient refuses treatment 3 consecutive times without medical reason, if treatment goals not met, if there is a change in medical status, if patient makes no progress towards goals or if patient is discharged from hospital.  The above assessment, treatment plan, treatment alternatives and goals were discussed and mutually agreed upon: by patient  AnnaAlfonse Alpers DPT  02/25/2020, 2:34 PM

## 2020-02-25 NOTE — Progress Notes (Signed)
Physical Therapy Session Note  Patient Details  Name: Carrie Cook MRN: [id number] Date of Birth: 08-17-1960  Today's Date: 02/25/2020 PT Individual Time: 1324-4010 PT Individual Time Calculation (min): 55 min   Short Term Goals: Week 1:  PT Short Term Goal 1 (Week 1): STG=LTG due to LOS  Skilled Therapeutic Interventions/Progress Updates:  Pt received seated in bed, agreeable to PT session. No complaints of pain initially, does report onset of low back pain with mobility that improves at rest. Pt also reports ongoing nausea, received anti-nausea medication prior to start of therapy session. Semi-reclined in bed to sitting EOB with Supervision, use of bedrail and HOB elevated. Sit to stand and stand pivot transfer with RW and CGA. Adjusted RW to appropriate height for patient. Ambulation x 180 ft with RW and CGA for balance, cues to keep RW closer to her during gait. Pt reports feeling significantly fatigued following ambulation. Standing mini-squats 2 x 10 reps with RW and CGA for balance, cues for correct body mechanics. Static standing balance with no UE support performing ball toss x 5 min with min A needed for balance. Pt tends to lose balance posteriorly but able to recover with min A. Pt reports increase in low back pain, deferred further standing activity. Sit to supine mod A for BLE management. Attempt supine bridges but pt has increase in back pain. Supine heel slides x 10 reps B, sidelying clamshells 2 x 10 reps B. Pt returned to sitting EOM with CGA for some trunk control. Pt returned to bed at end of session, mod A needed for BLE management. Pt left semi-reclined in bed with needs in reach, bed alarm in place at end of session.  Therapy Documentation Precautions:  Precautions Precautions: Back,Fall Precaution Comments: Pt is able to verbalize back precautions but requires cues to maintain during session Required Braces or Orthoses:  (no brace needs per order) Restrictions Weight  Bearing Restrictions: No   Therapy/Group: Individual Therapy   Excell Seltzer, PT, DPT  02/25/2020, 5:12 PM

## 2020-02-25 NOTE — Progress Notes (Addendum)
[facility] PHYSICAL MEDICINE & REHABILITATION PROGRESS NOTE  Subjective/Complaints: Patient seen sitting up in her chair this morning working with therapies.  She states she slept fairly overnight.  She states she feels like she will have a bowel movement soon.  She has questions about IV can be DC'd.  ROS: Denies CP, SOB, N/V/D  Objective: Vital Signs: Blood pressure (!) 141/81, pulse 86, temperature 98.1 F (36.7 C), resp. rate 14, height 5\' 3"  (1.6 m), weight 83.8 kg, last menstrual period 07/10/2013, SpO2 98 %. No results found. Recent Labs    02/25/20 0528  WBC 9.1  HGB 12.9  HCT 38.8  PLT 328   Recent Labs    02/25/20 0528  NA 137  K 4.5  CL 99  CO2 28  GLUCOSE 130*  BUN 10  CREATININE 0.85  CALCIUM 9.0    Intake/Output Summary (Last 24 hours) at 02/25/2020 9371 Last data filed at 02/24/2020 1900 Gross per 24 hour  Intake 240 ml  Output --  Net 240 ml        Physical Exam: BP (!) 141/81 (BP Location: Right Arm)   Pulse 86   Temp 98.1 F (36.7 C)   Resp 14   Ht 5\' 3"  (1.6 m)   Wt 83.8 kg   LMP 07/10/2013   SpO2 98%   BMI 32.72 kg/m  Constitutional: No distress . Vital signs reviewed. HENT: Normocephalic.  Atraumatic. Eyes: EOMI. No discharge. Cardiovascular: No JVD.  RRR. Respiratory: Normal effort.  No stridor.  Bilateral clear to auscultation. GI: Non-distended.  BS +. Skin: Warm and dry.  Neck incision with dressing CDI. Psych: Normal mood.  Normal behavior. Musc: No edema in extremities.  No tenderness in extremities. Neuro: Alert Motor: Bilateral upper extremities: 5/5 proximal and distal Right lower extremity: Hip flexion, knee extension 4+/5, ankle dorsiflexion 4/5 Left lower extremity: Hip flexion, knee extension 4/5, ankle dorsiflexion 2/5 Sensation diminished to light touch left toes  Assessment/Plan: 1. Functional deficits which require 3+ hours per day of interdisciplinary therapy in a comprehensive inpatient rehab  setting.  Physiatrist is providing close team supervision and 24 hour management of active medical problems listed below.  Physiatrist and rehab team continue to assess barriers to discharge/monitor patient progress toward functional and medical goals   Care Tool:  Bathing    Body parts bathed by patient: Right arm,Left arm,Chest,Abdomen,Front perineal area,Buttocks,Right upper leg,Left upper leg,Face   Body parts bathed by helper: Right lower leg,Left lower leg     Bathing assist Assist Level: Minimal Assistance - Patient > 75%     Upper Body Dressing/Undressing Upper body dressing   What is the patient wearing?: Pull over shirt    Upper body assist Assist Level: Minimal Assistance - Patient > 75%    Lower Body Dressing/Undressing Lower body dressing      What is the patient wearing?: Underwear/pull up,Pants     Lower body assist Assist for lower body dressing: Minimal Assistance - Patient > 75%     Toileting Toileting    Toileting assist Assist for toileting: Minimal Assistance - Patient > 75%     Transfers Chair/bed transfer  Transfers assist     Chair/bed transfer assist level: Moderate Assistance - Patient 50 - 74%     Locomotion Ambulation   Ambulation assist              Walk 10 feet activity   Assist           Walk 50  feet activity   Assist           Walk 150 feet activity   Assist           Walk 10 feet on uneven surface  activity   Assist           Wheelchair     Assist   Type of Wheelchair: Manual           Wheelchair 50 feet with 2 turns activity    Assist            Wheelchair 150 feet activity     Assist           Medical Problem List and Plan: 1.  B/L LE weakness, Left > Right with impaired function/ADLs/mobility s/p microdiscectomy and open decompression (L3-L5) secondary to Lumbar stenosis with myelopathy.   Begin CIR evaluations   Team conference today to discuss  current and goals and coordination of care, home and environmental barriers, and discharge planning with nursing, case manager, and therapies. Please see conference note from today as well.  2.  Antithrombotics: -DVT/anticoagulation:  Pharmaceutical: Lovenox             -antiplatelet therapy: N/a 3. Pain Management:              On Baclofen tid for muscle spasms.              Neurontin 400 mg qid             Oxycodone prn  Monitor with increased exertion 4. Mood: LCSW to follow for evaluation and support.              -antipsychotic agents: See #11 5. Neuropsych: This patient is capable of making decisions on her own behalf. 6. Skin/Wound Care: Routine pressure relief measures.  7. Fluids/Electrolytes/Nutrition: Monitor I/Os.              Added prosource to promote healing.  8.  Drug-induced constipation:   Continue Miralax with Senna S daily and augment as indicated.   Increase bowel meds as necessary 9. HTN: Monitor BP tid--continue Norvasc, Cozaar, Clonidine.   Vestibular eval  Orthostatics ordered             Monitor increase mobility 10. T2DM with hyperglycemia: Hgb A1c-6.4 and controlled. Will monitor BS ac/hs--use SSI prn for better control.               Continue metformin bid.              Resumed Lantus 5 units at bedtime  Monitor the increased mobility 11. Anxiety/depression: On Effexor bid, Lamictal bid for mood stabilization.               She had stopped taking Abilify X 5 month-->doesn't want it.  12. H/o Gastric bypass: Had stopped taking her vitamins. Continue multivitamin.              Has severe anemia/malabsorbtion             [facility].    13. Leucocytosis: Resolved 14. Muscle spasms  See #3 15. Urinary retention- based on clinical Sx's  PVRs ordered x3 days and cath if required.  16.  Hypoalbuminemia  Supplement initiated  LOS: 1 days A FACE TO FACE EVALUATION WAS PERFORMED  Carrie Cook Lorie Phenix 02/25/2020, 9:22 AM

## 2020-02-25 NOTE — Progress Notes (Signed)
Inpatient Rehabilitation Care Coordinator Assessment and Plan Patient Details  Name: Carrie Cook MRN: [id number] Date of Birth: 03-02-1960  Today's Date: 02/25/2020  Hospital Problems: Principal Problem:   Myelopathy concurrent with and due to stenosis of lumbar spine [facility]) Active Problems:   Lumbar radiculopathy   Lumbar disc herniation with myelopathy   Hypoalbuminemia due to protein-calorie malnutrition (HCC)   Muscle spasm   Controlled type 2 diabetes mellitus with hyperglycemia, without long-term current use of insulin ([place])   Essential hypertension   Drug induced constipation   Postoperative pain  Past Medical History:  Past Medical History:  Diagnosis Date  . Anemia    PT HAS HAD COLONOSCOPY AND ENDOSCOPY WORK UP - NO PROBLEMS FOUND AS SOURCE OF ANEMIA -- PT HAD IRON INFUSION AT [facility] 11/13/12-AFTER SEEING HEMATOLOGIST DR. Dutch John AND HE GAVE HEMATOLOGIC CLEARANCE FOR GASTRIC BYPASS SURGERY.  Marland Kitchen Anxiety   . Asthma    daily and prn inhalers  . Degenerative joint disease    right knee, spine - STATES INTERMITTENT  NUMBNESS DOWN RT LEG WITH PROLONGED STANDING OR WALKING- THINKS RELATED TO HER SPINE PROBLEMS  . Dental crowns present   . Depression   . Family history of anesthesia complication    pt's mother and sister have hx. of post-op N/V  . Fibroids    UTERINE  . Gastroesophageal reflux disease    none for 2 years since Bariatric surgery.  . H/O hiatal hernia   . History of endometriosis   . History of kidney stones   . Hyperlipidemia   . Hypertension    under control with med., has been on med. x 2 yr.  . Insulin dependent diabetes mellitus   . Lock jaw    jaw locks open if opens mouth wide  . Migraines   . Neuropathy    FEET  . Obesity   . Palpitations   . PONV (postoperative nausea and vomiting)   . Shortness of breath    with daily activities  . Sleep apnea    post-op didn't need CPAP but now states needs it again but not using   Past  Surgical History:  Past Surgical History:  Procedure Laterality Date  . ABDOMINAL HYSTERECTOMY N/A 06/06/2017   Procedure: HYSTERECTOMY ABDOMINAL;  Surgeon: Florian Buff, MD;  Location: [facility];  Service: Gynecology;  Laterality: N/A;  . BACK SURGERY    . BREATH TEK H PYLORI N/A 08/13/2012   Procedure: BREATH TEK H PYLORI;  Surgeon: Edward Jolly, MD;  Location: [facility];  Service: General;  Laterality: N/A;  . CARPAL TUNNEL RELEASE  01/03/2012   Procedure: CARPAL TUNNEL RELEASE;  Surgeon: Wynonia Sours, MD;  Location: [facility];  Service: Orthopedics;  Laterality: Right;  . carpal tunnel release right hand Right 12/13  . COLONOSCOPY  05/2009   [id number] hemorrhoids otherwise normal colon, rectum and terminal ileum  . COLONOSCOPY WITH ESOPHAGOGASTRODUODENOSCOPY (EGD) N/A 10/28/2012   Procedure: COLONOSCOPY WITH ESOPHAGOGASTRODUODENOSCOPY (EGD);  Surgeon: Daneil Dolin, MD;  [facility];  Service: Endoscopy;  Laterality: N/A;  7:30  . DILITATION & CURRETTAGE/HYSTROSCOPY WITH NOVASURE ABLATION N/A 07/22/2014   Procedure: DILATATION & CURETTAGE/HYSTEROSCOPY WITH NOVASURE ABLATION; uterine length 6.0 cm; uterine width 3.8 cm; total ablation time 1 minute 15 seconds;  Surgeon: Florian Buff, MD;  Location: [facility];  Service: Gynecology;  Laterality: N/A;  . ESOPHAGOGASTRODUODENOSCOPY  5/013/2011   [id number] esophagus/multiple polyps removed s/p (hyperplastic). Gastritis without H.pylori.  Marland Kitchen  ESOPHAGOGASTRODUODENOSCOPY (EGD) WITH PROPOFOL N/A 06/06/2018   with LA Grade A esophagitis s/p dilation.   Marland Kitchen GASTRIC ROUX-EN-Y N/A 11/19/2012   Procedure: LAPAROSCOPIC ROUX-EN-Y GASTRIC;  Surgeon: Edward Jolly, MD;  Location: [facility];  Service: General;  Laterality: N/A;  . GIVENS CAPSULE STUDY N/A 10/28/2012   Procedure: GIVENS CAPSULE STUDY;  Surgeon: Daneil Dolin, MD;  [facility];  Service: Endoscopy;  Laterality: N/A;  . KNEE ARTHROSCOPY WITH MEDIAL  MENISECTOMY Right 10/28/2015   Procedure: RIGHT KNEE ARTHROSCOPY WITH MEDIAL MENISECTOMY;  Surgeon: Carole Civil, MD;  [facility];  Service: Orthopedics;  Laterality: Right;  . LAPAROSCOPY     due to endometriosis  . LUMBAR LAMINECTOMY/DECOMPRESSION MICRODISCECTOMY N/A 02/14/2020   Procedure: LUMBAR LAMINECTOMY  Lumbar three-four, Lumbar four-five;  Surgeon: Consuella Lose, MD;  Location: [facility];  Service: Neurosurgery;  Laterality: N/A;  . Venia Minks DILATION N/A 06/06/2018   Procedure: Venia Minks DILATION;  Surgeon: Daneil Dolin, MD;  [facility];  Service: Endoscopy;  Laterality: N/A;  . PELVIC LAPAROSCOPY  11/04/1999   with fulguration of endometriosis  . SALPINGOOPHORECTOMY Bilateral 06/06/2017   Procedure: SALPINGO OOPHORECTOMY;  Surgeon: Florian Buff, MD;  Location[facility];  Service: Gynecology;  Laterality: Bilateral;  . TOTAL KNEE ARTHROPLASTY Right 07/22/2019   Procedure: TOTAL KNEE ARTHROPLASTY;  Surgeon: Carole Civil, MD;  Location[facility];  Service: Orthopedics;  Laterality: Right;  . TRIGGER FINGER RELEASE Right 09/24/2012   Procedure: RELEASE A-1 PULLEY OF RIGHT THUMB;  Surgeon: Wynonia Sours, MD;  Location: [facility];  Service: Orthopedics;  Laterality: Right;  . TUBAL LIGATION  1993   Social History:  reports that she has never smoked. She has never used smokeless tobacco. She reports that she does not drink alcohol and does not use drugs.  Family / Support Systems Marital Status: Married How Long?: 36 years Patient Roles: Spouse,Parent Spouse/Significant Other: Carrie Cook (husband): [phone] Children: 1 adult son Other Supports: None reported Anticipated Caregiver: husband Ability/Limitations of Caregiver: pt husband continues to work so pt needs to be Mod I at discharge. 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 Pt has been receiving SSDI since 2012. Education: high school Read: Yes Write: Yes Employment Status: Disabled Date Retired/Disabled/Unemployed: 2012 Public relations account executive Issues: Denies Guardian/Conservator: N/A   Abuse/Neglect Abuse/Neglect Assessment Can Be Completed: Yes Physical Abuse: Denies Verbal Abuse: Denies Sexual Abuse: Denies Exploitation of patient/patient's resources: Denies Self-Neglect: Denies  Emotional Status Pt's affect, behavior and adjustment status: Pt in good spirits at time of visit. Recent Psychosocial Issues: Pt admits that she is enrolled with [facility] for medication management to address depression and anxiety. Psychiatric History: Pt admits to anger management, group therapy, and individual therapy in the past. Nothing current. Substance Abuse History: Denies  Patient / Family Perceptions, Expectations & Goals Pt/Family understanding of illness & functional limitations: Pt has general understanding of pt care needs Premorbid pt/family roles/activities: Independent Anticipated changes in roles/activities/participation: Assistance with ADLs/IADLs Pt/family expectations/goals: Pt goal is to walk and be able to drive  [employer]: None Premorbid Home Care/DME Agencies: None Transportation available at discharge: husband Resource referrals recommended: Neuropsychology  Discharge Planning Living Arrangements: Spouse/significant other Support Systems: Spouse/significant other Type of Residence: Private residence Insurance Resources: Chartered certified accountant Resources: [employer]  Screen Referred: No Living Expenses: Mortgage Money Management: Patient Does the patient have any problems obtaining your medications?: No Home Management: Pt managed all home care needs Patient/Family Preliminary Plans: TBD Care Coordinator  Barriers to Discharge: Decreased caregiver support,Lack of/limited family support Care Coordinator Anticipated Follow Up Needs: HH/OP Expected length of stay: ELOS 10 days  Clinical Impression SW met with pt in room to introduce self, explain role, and discuss discharge process. 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 No HCPOA but would like information. DME: cane, RW, 3in1 BSC.  SW updated pt assigned RN on chaplain consult for [facility] request.   Rana Snare 02/25/2020, 5:21 PM

## 2020-02-25 NOTE — Evaluation (Signed)
Occupational Therapy Assessment and Plan  Patient Details  Name: Carrie Cook MRN: [id number] Date of Birth: [birth year]  OT Diagnosis: abnormal posture, acute pain, lumbago (low back pain) and muscle weakness (generalized) Rehab Potential: Rehab Potential (ACUTE ONLY): Good ELOS: 7-10 days   Today's Date: 02/25/2020 OT Individual Time: 8032-1224 OT Individual Time Calculation (min): 57 min     Hospital Problem: Principal Problem:   Lumbar disc herniation with myelopathy Active Problems:   Lumbar radiculopathy   Past Medical History:  Past Medical History:  Diagnosis Date  . Anemia    PT HAS HAD COLONOSCOPY AND ENDOSCOPY WORK UP - NO PROBLEMS FOUND AS SOURCE OF ANEMIA -- PT HAD IRON INFUSION AT [facility] 11/13/12-AFTER SEEING HEMATOLOGIST DR. [place] AND HE GAVE HEMATOLOGIC CLEARANCE FOR GASTRIC BYPASS SURGERY.  Marland Kitchen Anxiety   . Asthma    daily and prn inhalers  . Degenerative joint disease    right knee, spine - STATES INTERMITTENT  NUMBNESS DOWN RT LEG WITH PROLONGED STANDING OR WALKING- THINKS RELATED TO HER SPINE PROBLEMS  . Dental crowns present   . Depression   . Family history of anesthesia complication    pt's mother and sister have hx. of post-op N/V  . Fibroids    UTERINE  . Gastroesophageal reflux disease    none for 2 years since Bariatric surgery.  . H/O hiatal hernia   . History of endometriosis   . History of kidney stones   . Hyperlipidemia   . Hypertension    under control with med., has been on med. x 2 yr.  . Insulin dependent diabetes mellitus   . Lock jaw    jaw locks open if opens mouth wide  . Migraines   . Neuropathy    FEET  . Obesity   . Palpitations   . PONV (postoperative nausea and vomiting)   . Shortness of breath    with daily activities  . Sleep apnea    post-op didn't need CPAP but now states needs it again but not using   Past Surgical History:  Past Surgical History:  Procedure Laterality Date  . ABDOMINAL HYSTERECTOMY N/A  06/06/2017   Procedure: HYSTERECTOMY ABDOMINAL;  Surgeon: Florian Buff, MD;  Location: [facility];  Service: Gynecology;  Laterality: N/A;  . BACK SURGERY    . BREATH TEK H PYLORI N/A 08/13/2012   Procedure: BREATH TEK H PYLORI;  Surgeon: Edward Jolly, MD;  Location: [facility];  Service: General;  Laterality: N/A;  . CARPAL TUNNEL RELEASE  01/03/2012   Procedure: CARPAL TUNNEL RELEASE;  Surgeon: Wynonia Sours, MD;  Location: [place];  Service: Orthopedics;  Laterality: Right;  . carpal tunnel release right hand Right 12/13  . COLONOSCOPY  05/2009   MGN:[id number] hemorrhoids otherwise normal colon, rectum and terminal ileum  . COLONOSCOPY WITH ESOPHAGOGASTRODUODENOSCOPY (EGD) N/A 10/28/2012   Procedure: COLONOSCOPY WITH ESOPHAGOGASTRODUODENOSCOPY (EGD);  Surgeon: Daneil Dolin, MD;  [facility];  Service: Endoscopy;  Laterality: N/A;  7:30  . DILITATION & CURRETTAGE/HYSTROSCOPY WITH NOVASURE ABLATION N/A 07/22/2014   Procedure: DILATATION & CURETTAGE/HYSTEROSCOPY WITH NOVASURE ABLATION; uterine length 6.0 cm; uterine width 3.8 cm; total ablation time 1 minute 15 seconds;  Surgeon: Florian Buff, MD;  Location: [facility];  Service: Gynecology;  Laterality: N/A;  . ESOPHAGOGASTRODUODENOSCOPY  5/013/2011   QBV:QXIHWT esophagus/multiple polyps removed s/p (hyperplastic). Gastritis without H.pylori.  . ESOPHAGOGASTRODUODENOSCOPY (EGD) WITH PROPOFOL N/A 06/06/2018   with LA Grade A esophagitis  s/p dilation.   Marland Kitchen GASTRIC ROUX-EN-Y N/A 11/19/2012   Procedure: LAPAROSCOPIC ROUX-EN-Y GASTRIC;  Surgeon: Edward Jolly, MD;  Location: [facility];  Service: General;  Laterality: N/A;  . GIVENS CAPSULE STUDY N/A 10/28/2012   Procedure: GIVENS CAPSULE STUDY;  Surgeon: Daneil Dolin, MD;  [facility];  Service: Endoscopy;  Laterality: N/A;  . KNEE ARTHROSCOPY WITH MEDIAL MENISECTOMY Right 10/28/2015   Procedure: RIGHT KNEE ARTHROSCOPY WITH MEDIAL MENISECTOMY;  Surgeon:  Carole Civil, MD;  Location: [facility];  Service: Orthopedics;  Laterality: Right;  . LAPAROSCOPY     due to endometriosis  . LUMBAR LAMINECTOMY/DECOMPRESSION MICRODISCECTOMY N/A 02/14/2020   Procedure: LUMBAR LAMINECTOMY  Lumbar three-four, Lumbar four-five;  Surgeon: Consuella Lose, MD;  Location: [place];  Service: Neurosurgery;  Laterality: N/A;  . Venia Minks DILATION N/A 06/06/2018   Procedure: Venia Minks DILATION;  Surgeon: Daneil Dolin, MD;  [facility];  Service: Endoscopy;  Laterality: N/A;  . PELVIC LAPAROSCOPY  11/04/1999   with fulguration of endometriosis  . SALPINGOOPHORECTOMY Bilateral 06/06/2017   Procedure: SALPINGO OOPHORECTOMY;  Surgeon: Florian Buff, MD;  Location: [facility];  Service: Gynecology;  Laterality: Bilateral;  . TOTAL KNEE ARTHROPLASTY Right 07/22/2019   Procedure: TOTAL KNEE ARTHROPLASTY;  Surgeon: Carole Civil, MD;  Location: [facility];  Service: Orthopedics;  Laterality: Right;  . TRIGGER FINGER RELEASE Right 09/24/2012   Procedure: RELEASE A-1 PULLEY OF RIGHT THUMB;  Surgeon: Wynonia Sours, MD;  Location: [place];  Service: Orthopedics;  Laterality: Right;  . TUBAL LIGATION  1993    Assessment & Plan Clinical Impression: Patient is a 60 y.o. right handed female with history of HTN, T2DM, R-TKR (done 6/21) 06/2019, gastric bypass, Vit D deficiency, depression/anxiety, RLE pain with work up revealing moderate to severe spinal stenosis L3-4. She underwent L3/4 and L4/5 decompression on 01/19 at surgical center by Dr. Zada Finders. She was readmitted 01/21 due to new onset of left foot drop, severe muscle spasms, severe pain and fall. Marland Kitchen MRI spine done revealing severe spinal canal narrowing at L3-L5 levels with increased conspicuity of epidural fat--question postprocedural edema contributing to stenosis. She underwent bilateral L3/4 and L4/5 facetectomy with decompression of thecal nerve roots on 01/22.  Post op, neuropathy and LLE weakness  improving but has had severe muscle spasms requiring multiple medication adjustment. She was also found to be Covid + but has been asymptomatic and has been on isolation X 10 days.    Spasms have resolved (last this Sunday) with addition of baclofen and finally had BM after 10 days. Intake good. She continues to have cough as well as nausea with activity. Therapy has been ongoing and patient limited by BLE weakness with ataxia and sensory deficits, balance deficits with anterior lean as well as reports of nausea with OOBactivity OOB/dizziness with upward gaze. CIR Recommended due to functional decline.    Patient transferred to CIR on 02/24/2020 .    Patient currently requires min-mod with basic self-care skills secondary to muscle weakness, decreased cardiorespiratoy endurance and decreased postural control, decreased balance strategies and difficulty maintaining precautions.  Prior to hospitalization, patient could complete ADLs with independent .  Patient will benefit from skilled intervention to decrease level of assist with basic self-care skills and increase independence with basic self-care skills prior to discharge home with care partner.  Anticipate patient will require intermittent supervision and follow up home health.  OT - End of Session Activity Tolerance: Tolerates 30+ min  activity with multiple rests Endurance Deficit: Yes Endurance Deficit Description: limited by fear of "spasms" OT Assessment Rehab Potential (ACUTE ONLY): Good OT Barriers to Discharge: Decreased caregiver support;Home environment access/layout OT Barriers to Discharge Comments: husband self-employed, can provide intermittent assist OT Patient demonstrates impairments in the following area(s): Balance;Endurance;Motor;Pain;Safety;Sensory;Skin Integrity OT Basic ADL's Functional Problem(s): Grooming;Bathing;Dressing;Toileting OT Advanced ADL's Functional Problem(s): Simple Meal Preparation OT Transfers Functional  Problem(s): Toilet;Tub/Shower OT Additional Impairment(s): None OT Plan OT Intensity: Minimum of 1-2 x/day, 45 to 90 minutes OT Frequency: 5 out of 7 days OT Duration/Estimated Length of Stay: 7-10 days OT Treatment/Interventions: Balance/vestibular training;Community reintegration;Discharge planning;Disease mangement/prevention;DME/adaptive equipment instruction;Functional mobility training;Neuromuscular re-education;Pain management;Patient/family education;Psychosocial support;Self Care/advanced ADL retraining;Skin care/wound managment;Therapeutic Activities;Therapeutic Exercise;UE/LE Strength taining/ROM OT Basic Self-Care Anticipated Outcome(s): Mod I OT Toileting Anticipated Outcome(s): Mod I OT Bathroom Transfers Anticipated Outcome(s): Mod I toilet, Supervision shower OT Recommendation Recommendations for Other Services: Neuropsych consult Patient destination: Home Follow Up Recommendations: Home health OT Equipment Recommended: 3 in 1 bedside comode;Tub/shower seat   OT Evaluation Precautions/Restrictions  Precautions Precautions: Back;Fall Precaution Comments: Pt is able to verbalize back precautions but requires cues to maintain during session Required Braces or Orthoses:  (no brace needs per order) Restrictions Weight Bearing Restrictions: No General   Vital Signs Therapy Vitals Temp: 98.1 F (36.7 C) Pulse Rate: 86 Resp: 14 BP: (!) 141/81 Patient Position (if appropriate): Sitting Oxygen Therapy SpO2: 98 % O2 Device: Room Air Pain Pain Assessment Pain Scale: 0-10 Pain Score: 8  Pain Type: Surgical pain Pain Location: Back Pain Orientation: Lower Pain Descriptors / Indicators: Aching;Constant;Spasm Pain Intervention(s): RN made aware;Repositioned Home Living/Prior Functioning Home Living Family/patient expects to be discharged to:: Private residence Living Arrangements: Spouse/significant other Available Help at Discharge: Available PRN/intermittently  (spouse self-employed Dealer, can provide intermittent assist) Type of Home: House Home Access: Stairs to enter [employer] of Steps: 3 in front with no rails, 7 in back with B rails (can reach both) Home Layout: One level Bathroom Shower/Tub: Multimedia programmer: Standard Bathroom Accessibility: Yes Additional Comments: pt reports she will be home alone during the day  Lives With: Spouse IADL History Homemaking Responsibilities: Yes Meal Prep Responsibility: Secondary (primarily microwave dinners) Laundry Responsibility: No Cleaning Responsibility: No Shopping Responsibility: No Prior Function Level of Independence: Needs assistance with homemaking,Independent with basic ADLs  Able to Take Stairs?: Yes Driving: No Vocation: On disability Comments: about a month since tolerated sitting--mostly has been flat on her back; pain began in August and initially was using a cane to walk, having "flare ups" of pain and spasms, going to urgent cares until she had surgery Vision Baseline Vision/History: Wears glasses Wears Glasses: Reading only (readers) Patient Visual Report: No change from baseline Vision Assessment?: No apparent visual deficits Additional Comments: reports dizziness with mobility - vestibular eval ordered Perception  Perception: Within Functional Limits Praxis Praxis: Intact Cognition Overall Cognitive Status: Within Functional Limits for tasks assessed Arousal/Alertness: Awake/alert Orientation Level: Person;Place Year: 2022 Month: February Day of Week: Correct Memory: Appears intact Immediate Memory Recall: Sock;Blue;Bed Memory Recall Sock: Without Cue Memory Recall Blue: Without Cue Memory Recall Bed: Without Cue Attention: Selective Selective Attention: Appears intact Awareness: Appears intact Problem Solving: Appears intact Safety/Judgment: Appears intact Sensation Sensation Light Touch: Impaired Detail Peripheral sensation  comments: impaired in B feet and toes, also reports impaired sensation "numbness and tingling" in hands due to carpal tunnel Light Touch Impaired Details: Impaired RUE;Impaired LUE;Impaired RLE;Impaired LLE Proprioception: Impaired by gross assessment Motor  Motor  Motor: Within Functional Limits Motor - Skilled Clinical Observations: fatigue, generalized weakness, decreased balance/postural control, and low back pain  Trunk/Postural Assessment  Cervical Assessment Cervical Assessment: Within Functional Limits Thoracic Assessment Thoracic Assessment: Within Functional Limits Lumbar Assessment Lumbar Assessment: Exceptions to [facility] (posterior pelvic tilt) Postural Control Postural Control: Deficits on evaluation  Balance Balance Balance Assessed: Yes Standardized Balance Assessment Standardized Balance Assessment: Timed Up and Go Test Timed Up and Go Test TUG: Normal TUG Normal TUG (seconds): 40 Static Sitting Balance Static Sitting - Balance Support: Feet supported;Bilateral upper extremity supported Static Sitting - Level of Assistance: 5: Stand by assistance (supervision) Dynamic Sitting Balance Dynamic Sitting - Balance Support: Feet supported;No upper extremity supported Dynamic Sitting - Level of Assistance: 5: Stand by assistance (supervision) Static Standing Balance Static Standing - Balance Support: No upper extremity supported Static Standing - Level of Assistance: 4: Min assist Dynamic Standing Balance Dynamic Standing - Balance Support: No upper extremity supported Dynamic Standing - Level of Assistance: 3: Mod assist Extremity/Trunk Assessment RUE Assessment RUE Assessment: Within Functional Limits General Strength Comments: grossly 5/5 LUE Assessment LUE Assessment: Within Functional Limits General Strength Comments: grossly 5/5  Care Tool Care Tool Self Care Eating        Oral Care    Oral Care Assist Level: Minimal Assistance - Patient > 75% (in  standing)    Bathing   Body parts bathed by patient: Right arm;Left arm;Chest;Abdomen;Front perineal area;Buttocks;Right upper leg;Left upper leg;Face Body parts bathed by helper: Right lower leg;Left lower leg   Assist Level: Minimal Assistance - Patient > 75%    Upper Body Dressing(including orthotics)   What is the patient wearing?: Pull over shirt   Assist Level: Minimal Assistance - Patient > 75%    Lower Body Dressing (excluding footwear)   What is the patient wearing?: Underwear/pull up;Pants Assist for lower body dressing: Minimal Assistance - Patient > 75%    Putting on/Taking off footwear   What is the patient wearing?: Non-skid slipper socks Assist for footwear: Total Assistance - Patient < 25%       Care Tool Toileting Toileting activity   Assist for toileting: Minimal Assistance - Patient > 75%     Care Tool Bed Mobility Roll left and right activity   Roll left and right assist level: Contact Guard/Touching assist    Sit to lying activity   Sit to lying assist level: Minimal Assistance - Patient > 75%    Lying to sitting edge of bed activity   Lying to sitting edge of bed assist level: Contact Guard/Touching assist     Care Tool Transfers Sit to stand transfer   Sit to stand assist level: Moderate Assistance - Patient 50 - 74%    Chair/bed transfer   Chair/bed transfer assist level: Moderate Assistance - Patient 50 - 74%     Toilet transfer   Assist Level: Moderate Assistance - Patient 50 - 74%     Care Tool Cognition Expression of Ideas and Wants Expression of Ideas and Wants: Without difficulty (complex and basic) - expresses complex messages without difficulty and with speech that is clear and easy to understand   Understanding Verbal and Non-Verbal Content Understanding Verbal and Non-Verbal Content: Understands (complex and basic) - clear comprehension without cues or repetitions   Memory/Recall Ability *first 3 days only Memory/Recall Ability  *first 3 days only: Current season;Location of own room;Staff names and faces;That he or she is in a hospital/hospital unit    Refer to Care  Plan for Long Term Goals  SHORT TERM GOAL WEEK 1 OT Short Term Goal 1 (Week 1): STG = LTGs due to ELOS  Recommendations for other services: Neuropsych   Skilled Therapeutic Intervention OT eval completed with discussion of rehab process, OT purpose, POC, ELOS, and goals.  ADL assessment completed with focus on functional transfers, dynamic standing balance, and endurance during self-care tasks of bathing and dressing. Pt received semi-reclined in bed reporting need to toilet.  Pt completed log roll to EOB and sidelying to sit with CGA.  Pt completed sit > stand with mod assist from EOB, noted posterior lean initially but able to correct with cues and use of RW.  Pt ambulated to [facility] over toilet with RW with min-mod assist due to BLE weakness and reports of "spasms" coming on.  Pt completed toileting at sit > stand with min assist. Returned to sink and completed grooming, bathing, and dressing with supervision when seated. Pt demonstrated ability to complete figure 4 position when washing lower legs and donning underwear and pants.  Pt requires min assist for standing balance when pulling pants over hips.  Pt returned to bed at end of session.  Required min assist to lift RLE in to bed.  Pt remained semi-reclined in bed with all needs in reach.   ADL ADL Grooming: Minimal assistance Where Assessed-Grooming: Standing at sink Upper Body Bathing: Supervision/safety Where Assessed-Upper Body Bathing: Sitting at sink Lower Body Bathing: Minimal assistance Where Assessed-Lower Body Bathing: Sitting at sink;Standing at sink Upper Body Dressing: Minimal assistance Where Assessed-Upper Body Dressing: Sitting at sink Lower Body Dressing: Moderate assistance Where Assessed-Lower Body Dressing: Sitting at sink;Standing at sink Toileting: Minimal assistance Where  Assessed-Toileting: Glass blower/designer: Psychiatric nurse Method: Counselling psychologist: Bedside commode Mobility  Bed Mobility Bed Mobility: Rolling Right;Right Sidelying to Sit Rolling Right: Contact Guard/Touching assist Right Sidelying to Sit: Contact Guard/Touching assist Transfers Sit to Stand: Moderate Assistance - Patient 50-74% Stand to Sit: Minimal Assistance - Patient > 75%   Discharge Criteria: Patient will be discharged from OT if patient refuses treatment 3 consecutive times without medical reason, if treatment goals not met, if there is a change in medical status, if patient makes no progress towards goals or if patient is discharged from hospital.  The above assessment, treatment plan, treatment alternatives and goals were discussed and mutually agreed upon: by patient  Simonne Come 02/25/2020, 8:59 AM

## 2020-02-25 NOTE — Progress Notes (Signed)
[facility] Individual Statement of Services  Patient Name:  ASUNCION TAPSCOTT  Date:  02/25/2020  Welcome to the [facility].  Our goal is to provide you with an individualized program based on your diagnosis and situation, designed to meet your specific needs.  With this comprehensive rehabilitation program, you will be expected to participate in at least 3 hours of rehabilitation therapies Monday-Friday, with modified therapy programming on the weekends.  Your rehabilitation program will include the following services:  Physical Therapy (PT), Occupational Therapy (OT), Speech Therapy (ST), 24 hour per day rehabilitation nursing, Therapeutic Recreaction (TR), Neuropsychology, Care Coordinator, Rehabilitation Medicine, Nutrition Services, Pharmacy Services and Other  Weekly team conferences will be held on Wednesday to discuss your progress.  Your Inpatient Rehabilitation Care Coordinator will talk with you frequently to get your input and to update you on team discussions.  Team conferences with you and your family in attendance may also be held.  Expected length of stay: 7-10 Days  Overall anticipated outcome: MOD I  Depending on your progress and recovery, your program may change. Your Inpatient Rehabilitation Care Coordinator will coordinate services and will keep you informed of any changes. Your Inpatient Rehabilitation Care Coordinator's name and contact numbers are listed  below.  The following services may also be recommended but are not provided by the [facility]:    [place] will be made to provide these services after discharge if needed.  Arrangements include referral to agencies that provide these services.  Your insurance has been verified to be:  Medicare A & B Your primary doctor is:  Sallee Lange, MD  Pertinent information will be  shared with your doctor and your insurance company.  Inpatient Rehabilitation Care Coordinator:  Ovidio Kin, [place] or Emilia Beck  Information discussed with and copy given to patient by: Dyanne Iha, 02/25/2020, 9:43 AM

## 2020-02-26 DIAGNOSIS — M48061 Spinal stenosis, lumbar region without neurogenic claudication: Secondary | ICD-10-CM

## 2020-02-26 DIAGNOSIS — I10 Essential (primary) hypertension: Secondary | ICD-10-CM | POA: Diagnosis not present

## 2020-02-26 DIAGNOSIS — E1165 Type 2 diabetes mellitus with hyperglycemia: Secondary | ICD-10-CM | POA: Diagnosis not present

## 2020-02-26 DIAGNOSIS — G992 Myelopathy in diseases classified elsewhere: Secondary | ICD-10-CM

## 2020-02-26 DIAGNOSIS — K5903 Drug induced constipation: Secondary | ICD-10-CM | POA: Diagnosis not present

## 2020-02-26 LAB — GLUCOSE, CAPILLARY
Glucose-Capillary: 124 mg/dL — ABNORMAL HIGH (ref 70–99)
Glucose-Capillary: 130 mg/dL — ABNORMAL HIGH (ref 70–99)
Glucose-Capillary: 148 mg/dL — ABNORMAL HIGH (ref 70–99)
Glucose-Capillary: 89 mg/dL (ref 70–99)

## 2020-02-26 NOTE — Progress Notes (Signed)
Physical Therapy Session Note  Patient Details  Name: Carrie Cook MRN: [id number] Date of Birth: Apr 10, 1960  Today's Date: 02/26/2020 PT Individual Time: 0900-1012 PT Individual Time Calculation (min): 72 min   Short Term Goals: Week 1:  PT Short Term Goal 1 (Week 1): STG=LTG due to LOS  Skilled Therapeutic Interventions/Progress Updates:   Received pt supine in bed, pt agreeable to therapy, and reported pain 6/10 in low back. RN notified and present to administer medications during session. Repositioning and rest breaks done to reduce pain levels. Session with emphasis on functional mobility/transfers, generalized strengthening, dynamic standing balance/coordination, ambulation, and improved activity tolerance. Pt reported urge to use restroom and transferred supine<>sitting EOB with bedrails and supervision and ambulated 50ft x 2 trials with RW and CGA to/from bathroom. Pt able to manage clothing with CGA and void. Pt stood and washed hands at sink with supervision and brushed teeth sitting in [facility] with supervision. Pt transported to dayroom in [facility] total A for time management purposes and ambulated 19ft x 2 trials with RW and CGA. Pt demonstrates good RW safety awareness but continues to demonstrate bilateral knee flexion in stance due to previous TKA and difficulty with knee extension. Pt performed alternating toe taps to 3in step with min/mod handheld assist 2x15 reps.  Pt with increased difficulty lifting LLE due to mild foot drop. Pt performed the following exercises standing with UE support on RW and CGA: -mini-squats 2x8 -hip abduction 2x10 bilaterally with 1lb ankle weight. Pt demonstrated significant hip abductor weakness frequently compensating with hip flexion L>R. -hamstring curls with 1lb ankle weight 2x10 bilaterally -hip extension 2x10 bilaterally with 1lb ankle weight. Pt with increased difficulty with L knee extension Pt required multiple rest/water breaks throughout session and  therapist provided cool washcloths for pt to cool off. Pt performed lateral side stepping at rail x 74ft with no UE support fading to BUE support with CGA/min A. Pt with increased difficulty leading with LLE>RLE and with occasional posterior LOB. Pt transported back to room in [facility] total A. Concluded session with pt sitting in WC, needs within reach, and chair pad alarm on. Therapist provided drinks/snacks for pt.   Therapy Documentation Precautions:  Precautions Precautions: Back,Fall Precaution Comments: Pt is able to verbalize back precautions but requires cues to maintain during session Required Braces or Orthoses:  (no brace needs per order) Restrictions Weight Bearing Restrictions: No  Therapy/Group: Individual Therapy Alfonse Alpers PT, DPT   02/26/2020, 7:24 AM

## 2020-02-26 NOTE — Progress Notes (Signed)
Occupational Therapy Session Note  Patient Details  Name: Carrie Cook MRN: [id number] Date of Birth: 1960/09/08  Today's Date: 02/26/2020 OT Individual Time: 1110-1203 OT Individual Time Calculation (min): 53 min    Short Term Goals: Week 1:  OT Short Term Goal 1 (Week 1): STG = LTGs due to ELOS  Skilled Therapeutic Interventions/Progress Updates:    Session 1: (1110-1203 ) Pt completed shower and dressing during session.  She was able to ambulate to the shower with min assist using the RW for support.  She was able to complete all bathing with overall min guard assist with use of the grab bar for support.  She needed min instructional cueing for adherence to her back precautions and not bending forward to try and wash her lower legs.  She transferred out to the wheelchair for dressing with completion of UB at supervision and LB at min assist.  Min instructional cueing was given to start with the RLE first when donning underpants and pants secondary to recent knee surgery with limitations in knee flexion.  She was left in the wheelchair with the call button and phone in reach and safety chair alarm in place.    Session 2: (2671-2458)  Pt completed supine to sit EOB with supervision and min instructional cueing for following back precautions.  She then ambulated with use of the RW to the dayroom and min guard assist.  Provided her with a reacher bag and a reacher to work on retrieving bean bags from the floor in the dayroom for simulated home management tasks.  She was able to ambulate around the room with min guard using the RW for support to pick up the bean bags.  Two to 3 min rest break given before ambulating back to the room and transferring back to bed at the previous min guard assist level.  Call button and phone in reach with safety alarm in place.     Therapy Documentation Precautions:  Precautions Precautions: Back,Fall Precaution Booklet Issued: No Precaution Comments: Pt is able  to verbalize back precautions but requires cues to maintain during session Required Braces or Orthoses:  (no brace needs per order) Restrictions Weight Bearing Restrictions: No  Pain: Pain Assessment Pain Scale: Faces Pain Score: 6  Faces Pain Scale: Hurts little more Pain Type: Surgical pain Pain Location: Back Pain Orientation: Lower Pain Descriptors / Indicators: Aching;Discomfort Pain Frequency: Intermittent Pain Onset: With Activity Pain Intervention(s): Repositioned;Emotional support ADL: See Care Tool Section for some details of mobility and selfcare  Therapy/Group: Individual Therapy  Lennis Rader OTR/L 02/26/2020, 12:45 PM

## 2020-02-26 NOTE — Progress Notes (Signed)
[facility] PHYSICAL MEDICINE & REHABILITATION PROGRESS NOTE  Subjective/Complaints: Patient seen sitting up in bed this morning.  She states she slept well overnight.  She states she is nausea on/off, but overall appears to be improving.  She notes regular bowel movements that are liquidy now.  ROS: Denies CP, SOB, N/V/D  Objective: Vital Signs: Blood pressure (!) 146/91, pulse 79, temperature 98.5 F (36.9 C), resp. rate 17, height 5\' 3"  (1.6 m), weight 83.8 kg, last menstrual period 07/10/2013, SpO2 96 %. No results found. Recent Labs    02/25/20 0528  WBC 9.1  HGB 12.9  HCT 38.8  PLT 328   Recent Labs    02/25/20 0528  NA 137  K 4.5  CL 99  CO2 28  GLUCOSE 130*  BUN 10  CREATININE 0.85  CALCIUM 9.0    Intake/Output Summary (Last 24 hours) at 02/26/2020 0945 Last data filed at 02/26/2020 0719 Gross per 24 hour  Intake 840 ml  Output --  Net 840 ml        Physical Exam: BP (!) 146/91   Pulse 79   Temp 98.5 F (36.9 C)   Resp 17   Ht 5\' 3"  (1.6 m)   Wt 83.8 kg   LMP 07/10/2013   SpO2 96%   BMI 32.72 kg/m  Constitutional: No distress . Vital signs reviewed. HENT: Normocephalic.  Atraumatic. Eyes: EOMI. No discharge. Cardiovascular: No JVD.  RRR. Respiratory: Normal effort.  No stridor.  Bilateral clear to auscultation. GI: Non-distended.  BS +. Skin: Warm and dry.   Back incision CDI Psych: Normal mood.  Normal behavior. Musc: No edema in extremities.  No tenderness in extremities. Neuro: Alert Motor: Bilateral upper extremities: 5/5 proximal and distal Right lower extremity: Hip flexion, knee extension 4+/5, ankle dorsiflexion 4/5 Left lower extremity: Hip flexion, knee extension 4/5, ankle dorsiflexion 3-3+/5 Sensation diminished to light touch left toes  Assessment/Plan: 1. Functional deficits which require 3+ hours per day of interdisciplinary therapy in a comprehensive inpatient rehab setting.  Physiatrist is providing close team supervision  and 24 hour management of active medical problems listed below.  Physiatrist and rehab team continue to assess barriers to discharge/monitor patient progress toward functional and medical goals   Care Tool:  Bathing    Body parts bathed by patient: Right arm,Left arm,Chest,Abdomen,Front perineal area,Buttocks,Right upper leg,Left upper leg,Face   Body parts bathed by helper: Right lower leg,Left lower leg     Bathing assist Assist Level: Minimal Assistance - Patient > 75%     Upper Body Dressing/Undressing Upper body dressing   What is the patient wearing?: Pull over shirt    Upper body assist Assist Level: Minimal Assistance - Patient > 75%    Lower Body Dressing/Undressing Lower body dressing      What is the patient wearing?: Underwear/pull up,Pants     Lower body assist Assist for lower body dressing: Minimal Assistance - Patient > 75%     Toileting Toileting    Toileting assist Assist for toileting: Minimal Assistance - Patient > 75%     Transfers Chair/bed transfer  Transfers assist     Chair/bed transfer assist level: Contact Guard/Touching assist     Locomotion Ambulation   Ambulation assist   Ambulation activity did not occur: N/A  Assist level: Contact Guard/Touching assist Assistive device: Walker-rolling Max distance: 180'   Walk 10 feet activity   Assist  Walk 10 feet activity did not occur: N/A  Assist level: Contact Guard/Touching assist  Assistive device: Walker-rolling   Walk 50 feet activity   Assist Walk 50 feet with 2 turns activity did not occur: N/A  Assist level: Contact Guard/Touching assist Assistive device: Walker-rolling    Walk 150 feet activity   Assist Walk 150 feet activity did not occur: N/A  Assist level: Contact Guard/Touching assist Assistive device: Walker-rolling    Walk 10 feet on uneven surface  activity   Assist Walk 10 feet on uneven surfaces activity did not occur: N/A   Assist  level: Moderate Assistance - Patient - 50 - 74% Assistive device: Aeronautical engineer Will patient use wheelchair at discharge?: No Type of Wheelchair: Manual    Wheelchair assist level: Supervision/Verbal cueing Max wheelchair distance: 124ft    Wheelchair 50 feet with 2 turns activity    Assist        Assist Level: Supervision/Verbal cueing   Wheelchair 150 feet activity     Assist      Assist Level: Supervision/Verbal cueing    Medical Problem List and Plan: 1.  B/L LE weakness, Left > Right with impaired function/ADLs/mobility s/p microdiscectomy and open decompression (L3-L5) secondary to Lumbar stenosis with myelopathy.   Continue CIR 2.  Antithrombotics: -DVT/anticoagulation:  Pharmaceutical: Lovenox             -antiplatelet therapy: N/a 3. Pain Management:              On Baclofen tid for muscle spasms.              Neurontin 400 mg qid             Oxycodone prn  Controlled with meds on 2/3  Monitor with increased exertion 4. Mood: LCSW to follow for evaluation and support.              -antipsychotic agents: See #11 5. Neuropsych: This patient is capable of making decisions on her own behalf. 6. Skin/Wound Care: Routine pressure relief measures.  7. Fluids/Electrolytes/Nutrition: Monitor I/Os.              Added prosource to promote healing.  8.  Drug-induced constipation:   Continue Miralax daily  Senna S DC'd on 2/20  Bowel meds decreased on 2/3  Adjust bowel meds as necessary 9. HTN: Monitor BP tid--continue Norvasc, Cozaar, Clonidine.   Slightly labile on 2/3  Orthostatics ordered, pending             Monitor increase mobility 10. T2DM with hyperglycemia: Hgb A1c-6.4 and controlled. Will monitor BS ac/hs--use SSI prn for better control.               Continue metformin bid.              Resumed Lantus 5 units at bedtime  Mildly elevated on 2/3  Monitor the increased mobility 11. Anxiety/depression: On Effexor bid,  Lamictal bid for mood stabilization.               She had stopped taking Abilify X 5 month-->doesn't want it.  12. H/o Gastric bypass: Had stopped taking her vitamins. Continue multivitamin.   [facility]    13. Leucocytosis: Resolved 14. Muscle spasms  See #3 15. Urinary retention-?  Resolved  PVRs unremarkable.  16.  Hypoalbuminemia  Supplement initiated  LOS: 2 days A FACE TO FACE EVALUATION WAS PERFORMED  Birt Reinoso Lorie Phenix 02/26/2020, 9:45 AM

## 2020-02-26 NOTE — Care Management (Signed)
[facility] Individual Statement of Services  Patient Name:  Carrie Cook  Date:  02/26/2020  Welcome to the [facility].  Our goal is to provide you with an individualized program based on your diagnosis and situation, designed to meet your specific needs.  With this comprehensive rehabilitation program, you will be expected to participate in at least 3 hours of rehabilitation therapies Monday-Friday, with modified therapy programming on the weekends.  Your rehabilitation program will include the following services:  Physical Therapy (PT), Occupational Therapy (OT), 24 hour per day rehabilitation nursing, Therapeutic Recreaction (TR), Psychology, Neuropsychology, Care Coordinator, Rehabilitation Medicine, Nutrition Services, Pharmacy Services and Other  Weekly team conferences will be held on Wednesdays to discuss your progress.  Your Inpatient Rehabilitation Care Coordinator will talk with you frequently to get your input and to update you on team discussions.  Team conferences with you and your family in attendance may also be held.  Expected length of stay: 7-10 days   Overall anticipated outcome: Independent with an Assistive Device  Depending on your progress and recovery, your program may change. Your Inpatient Rehabilitation Care Coordinator will coordinate services and will keep you informed of any changes. Your Inpatient Rehabilitation Care Coordinator's name and contact numbers are listed  below.  The following services may also be recommended but are not provided by the [facility] will be made to provide these services after discharge if needed.  Arrangements include referral to agencies that provide these services.  Your insurance has been verified to be:  Medicare A/B  Your  primary doctor is:  Sallee Lange  Pertinent information will be shared with your doctor and your insurance company.  Inpatient Rehabilitation Care Coordinator:  Cathleen Corti [phone] or ([phone]  Information discussed with and copy given to patient by: Rana Snare, 02/26/2020, 9:31 AM

## 2020-02-26 NOTE — Progress Notes (Signed)
Physical Therapy Session Note  Patient Details  Name: Carrie Cook MRN: [id number] Date of Birth: 1960/08/07  Today's Date: 02/26/2020 PT Individual Time: 0900-1012 PT Individual Time Calculation (min): 72 min [phone]  Short Term Goals: Week 1:  PT Short Term Goal 1 (Week 1): STG=LTG due to LOS Week 2:     Skilled Therapeutic Interventions/Progress Updates:    PAIN C/o localized R sided >L low back pain w/activity, rest breaks and position changes provided as needed.   Sit to stand w/close superivsion w/RW. Gait 168ft x 2 w/RW, cga, does not fully extend R knee thru stance/states is is premorbid issue. Mild wobbles L knee, mild steppage gait LLE, occasional cues to remain safely within walker.  Standing balance/core strengthening: Static stand without AD w/cga, mild post tendency, poor abdominal activation, overall mild sway. Balance actibvities included eyes closed x 10 sec, alternating arm raises, mild reaching, cervical nods (most challenging - min assist to steady.) Core strengthening in stand - pelvic titls, transverse abd contractions, scapular retraction  rpeated Sit to stand x 5 for functional strengthening.    Handed off to OT for next session.   Therapy Documentation Precautions:  Precautions Precautions: Back,Fall Precaution Comments: Pt is able to verbalize back precautions but requires cues to maintain during session Required Braces or Orthoses:  (no brace needs per order) Restrictions Weight Bearing Restrictions: No   Therapy/Group: Individual Therapy  Callie Fielding, [place] 02/26/2020, 12:39 PM

## 2020-02-26 NOTE — Progress Notes (Signed)
Patient requested AD packet. Chaplain carried it to patient's room and she was in PT. Chaplain informed nurse that she left the packet on patient's bed and is available when needed.    02/26/20 1000  Clinical Encounter Type  Visited With Health care provider  Visit Type Initial  Referral From Nurse  Consult/Referral To Chaplain  Spiritual Encounters  Spiritual Needs Literature

## 2020-02-27 LAB — GLUCOSE, CAPILLARY
Glucose-Capillary: 132 mg/dL — ABNORMAL HIGH (ref 70–99)
Glucose-Capillary: 99 mg/dL (ref 70–99)
Glucose-Capillary: 191 mg/dL — ABNORMAL HIGH (ref 70–99)
Glucose-Capillary: 176 mg/dL — ABNORMAL HIGH (ref 70–99)

## 2020-02-27 NOTE — Progress Notes (Signed)
Occupational Therapy Session Note  Patient Details  Name: Carrie Cook MRN: [id number] Date of Birth: 1960/05/25  Today's Date: 02/27/2020 OT Individual Time: 1052-1202 and 1400-1500 OT Individual Time Calculation (min): 70 min and 60 min   Short Term Goals: Week 1:  OT Short Term Goal 1 (Week 1): STG = LTGs due to ELOS  Skilled Therapeutic Interventions/Progress Updates:    1) Treatment session with focus on functional mobility and adherence to back precautions during functional mobility in home environment. Pt received upright in w/c agreeable to therapy session.  Pt asking questions about maintaining back precautions while completing typical home management tasks.  Pt transported to ADL apt to engage in mobility in simulated home environment.  Engaged in discussion of use of w/c vs RW with home making tasks.  Therapist encouraging use of w/c for meal prep tasks and even if to complete laundry tasks to increase ability to transport items in seated position.  Pt propelled w/c through ADL kitchen and obtained items from refrigerator and transported to microwave.  Completed transfer of items back to refrigerator from microwave with use of RW, while therapist educating on use of counters and use of walker bag to transport items.  CGA for mobility with RW for safety.  Engaged in simulated shower transfer with stepping over 5-6" ledge with RW.  Pt able to complete with CGA with RW and use of grab bar.  Pt reports spoke with SWK to order shower chair for home.  Pt ambulated 80' back to room with CGA with RW.  Pt remained upright in w/c with chair alarm on and all needs in reach.  2) Treatment session with focus on toilet transfers, toileting, and dynamic standing balance.  Pt received supine in bed expressing desire to toilet.  Pt completed bed mobility with supervision, log roll.  Pt ambulated to toilet with CGA to close supervision.  Pt completed clothing management and hygiene at Mod I level.  Pt  ambulated back to sink with RW with supervision.  Completed hand hygiene in standing.  Returned to sitting EOB to discuss maintaining back precautions during toileting.  Discussed various alternative methods for hygiene to ensure thoroughness while also maintaining back precautions.  Engaged in dynamic standing balance with horse shoe toss incorporating reach while adhering to back precautions.  Pt utilized reacher to obtain tossed horse shoes, focused on bending knees when needing to increase reach to ensure not bending back.  Pt required 2 rest breaks during task due to reports of increased burning between shoulders.  Pt ambulated 40' back to room with RW with CGA.  Pt transferred back to bed supervision and left semi-reclined with all needs in reach.  Therapy Documentation Precautions:  Precautions Precautions: Back,Fall Precaution Booklet Issued: No Precaution Comments: Pt is able to verbalize back precautions but requires cues to maintain during session Required Braces or Orthoses:  (no brace needs per order) Restrictions Weight Bearing Restrictions: No General:   Vital Signs: Therapy Vitals Temp: 98.3 F (36.8 C) Pulse Rate: 88 Resp: 19 BP: 113/77 Patient Position (if appropriate): Lying Oxygen Therapy SpO2: 97 % O2 Device: Room Air Pain:  Pt reports burning sensation between shoulder blades in both sessions, not rated.  RN provided muscle relaxer during PM session.   Therapy/Group: Individual Therapy  Simonne Come 02/27/2020, 3:21 PM

## 2020-02-27 NOTE — Progress Notes (Signed)
Physical Therapy Session Note  Patient Details  Name: Carrie Cook MRN: [id number] Date of Birth: [birth year]  Today's Date: 02/27/2020 PT Individual Time: 0915-1030 PT Individual Time Calculation (min): 75 min   Short Term Goals: Week 1:  PT Short Term Goal 1 (Week 1): STG=LTG due to LOS Week 2:     Skilled Therapeutic Interventions/Progress Updates:    PAIN rates as 5-6/10 burning low back.  See below for stretching and seating adjustments.  Pt initially supine.  Supine to sit mod I.  Sit to stand w/RW and cga.  Gait 60ft and commode transfer w/RW and cga.  Continent of urine and independent w/hygiene.  Short distance gait to sink w/RW, when negotiating transition down from BR pt w/lateral balance loss to R requiring mod assist for recovery.   Pt stood at sink using sink for stability and brushes teeth w/supervision and RW.  Pt transported to gym.    Functional gait. 35ft x 2 weaving around cones placed at 74ft intervals w/cga, occasional cues to increase base of support due to mild crossing over w/LLE.  Standing core activation + balance activity -  Forward press w/2 lb bar x 10 w/verbal and tactilee cues for activation of abdominals Overhead reach/no weight/bilat UEs cues as above Pt w/increased sway, mild post tendency, delayed reactions but recovers wihtout assist.   Short distance gait to/from mat/turn sit w/cga w/RW  Sit to/from supine w/supervision, cues for logrolling.  Pt instructed w/and performed the following therex to address back discomfort:  Piriformis stretch 2 x 30sec each Hamstring stretch 2 x 30sec each Single knee to chest x 10 w/5 -10 sec hold Double knee to chest x 2 w/10 sec hold  wc seating/positioning: Pt sitting w/poor postural support in current wc.  Pt provided w/solid back, applewood insert for base, shortened leg rests w/significant improvement in postural support/alignment and reported improved comfort achieved.  At end of session, pt  transported to room.  NT in room changing linens, pt handed off to NT.   Therapy Documentation Precautions:  Precautions Precautions: Back,Fall Precaution Booklet Issued: No Precaution Comments: Pt is able to verbalize back precautions but requires cues to maintain during session Required Braces or Orthoses:  (no brace needs per order) Restrictions Weight Bearing Restrictions: No    Therapy/Group: Individual Therapy  Callie Fielding, PT   Jerrilyn Cairo 02/27/2020, 12:29 PM

## 2020-02-28 LAB — GLUCOSE, CAPILLARY
Glucose-Capillary: 122 mg/dL — ABNORMAL HIGH (ref 70–99)
Glucose-Capillary: 129 mg/dL — ABNORMAL HIGH (ref 70–99)
Glucose-Capillary: 103 mg/dL — ABNORMAL HIGH (ref 70–99)
Glucose-Capillary: 149 mg/dL — ABNORMAL HIGH (ref 70–99)

## 2020-02-28 NOTE — Progress Notes (Signed)
Physical Therapy Session Note  Patient Details  Name: Carrie Cook MRN: [id number] Date of Birth: Oct 11, 1960  Today's Date: 02/28/2020 PT Individual Time: 1445-1530 PT Individual Time Calculation (min): 45 min   Short Term Goals: Week 1:  PT Short Term Goal 1 (Week 1): STG=LTG due to LOS  Skilled Therapeutic Interventions/Progress Updates:     Pt received semi-reclined in bed and agreeable to therapy. Supine<>sit with supervision, sit<>stand CGA/close supervision throughout session. Gait 2 x 150 ft to and from day room using RW and close supervision. Nustep 2 x 6 min, level 6 at 40+ spm, RPE of 11/20. Pt reported a pulling sensation in her back and some pain in her hip and buttocks with activity that resolves at rest. Pt performed 4 rounds of corn hole in SLS with foot on 3" stair with RW for UUE support and CGA. Pt was able to maintain balance but reported fatigue in her legs, R>L. Pt returned to bed after session and was left with all needs in place and bed alarm active.   Therapy Documentation Precautions:  Precautions Precautions: Back,Fall Precaution Booklet Issued: No Precaution Comments: Pt is able to verbalize back precautions but requires cues to maintain during session Required Braces or Orthoses:  (no brace needs per order) Restrictions Weight Bearing Restrictions: No    Therapy/Group: Individual Therapy  Sharen Counter, SPT 02/28/2020, 5:42 PM

## 2020-02-28 NOTE — Progress Notes (Signed)
Occupational Therapy Session Note  Patient Details  Name: Carrie Cook MRN: [id number] Date of Birth: [birth year]  Today's Date: 02/28/2020 OT Individual Time: 1340-1440 OT Individual Time Calculation (min): 60 min    Short Term Goals: Week 1:  OT Short Term Goal 1 (Week 1): STG = LTGs due to ELOS  Skilled Therapeutic Interventions/Progress Updates: Focus this session as follows:   + Watch for left knee/hip buckling.+  Bed to/fr regular toilet transfer via RW=close S due to left hip decreased stability/strength and appeared to cause left knee to slightly buckle.  She appeared to adhere to back precautions for the most part although at times due to what she described as " short arms,"  her alternating hiking hips came extremely close or borderline.  As well, patient received more education as she asked for many alternatives to a toilet toilet aide for cleansing her bottom after  BMs and in spite of hemorrhoids.   As well, she briefly asked re the energy conservation handout left by earlier OT practitioner.    She completed the session with functional mobility activities such as mock light house keeping rolling walker level.  She was left seated on her bed at the end of the session.  Continue OT Plan of care.     Therapy Documentation Precautions:  Precautions Precautions: Back,Fall Precaution Booklet Issued: No Precaution Comments: Pt is able to verbalize back precautions but requires cues to maintain during session Required Braces or Orthoses:  (no brace needs per order) Restrictions Weight Bearing Restrictions: No  Pain:denied     Therapy/Group: Individual Therapy  Alfredia Ferguson [facility] 02/28/2020, 4:16 PM

## 2020-02-28 NOTE — Progress Notes (Signed)
Occupational Therapy Session Note  Patient Details  Name: Carrie Cook MRN: [id number] Date of Birth: 10-02-1960  Today's Date: 02/28/2020 OT Individual Time: 0700-0728 OT Individual Time Calculation (min): 28 min    Short Term Goals: Week 1:  OT Short Term Goal 1 (Week 1): STG = LTGs due to ELOS  Skilled Therapeutic Interventions/Progress Updates:    1:1. Pt received in bed with "a little" pain in back, however agreeable to OT as RN will deliver meds after session. Pt completes all mobility with S and RW and HOB elevated. Pt dons shirt and bra with set up. Pt grooms in standing at sink with S. Pt sits while OT educates (handout provided) on energy conservation with specific examples of ADL and IADL adaptations with the "4 P principles." Pt verbalized understanding. Exited session with pt seated in bed, exit alarm on and call light in reach   Therapy Documentation Precautions:  Precautions Precautions: Back,Fall Precaution Booklet Issued: No Precaution Comments: Pt is able to verbalize back precautions but requires cues to maintain during session Required Braces or Orthoses:  (no brace needs per order) Restrictions Weight Bearing Restrictions: No General:   Vital Signs: Therapy Vitals Temp: 98 F (36.7 C) Temp Source: Oral Pulse Rate: 75 Resp: 16 BP: 123/87 Patient Position (if appropriate): Lying Oxygen Therapy SpO2: 97 % O2 Device: Room Air Pain:   ADL: ADL Grooming: Minimal assistance Where Assessed-Grooming: Standing at sink Upper Body Bathing: Supervision/safety Where Assessed-Upper Body Bathing: Sitting at sink Lower Body Bathing: Minimal assistance Where Assessed-Lower Body Bathing: Sitting at sink,Standing at sink Upper Body Dressing: Minimal assistance Where Assessed-Upper Body Dressing: Sitting at sink Lower Body Dressing: Moderate assistance Where Assessed-Lower Body Dressing: Sitting at sink,Standing at sink Toileting: Minimal assistance Where  Assessed-Toileting: Glass blower/designer: Psychiatric nurse Method: Counselling psychologist: Art gallery manager    Praxis   Exercises:   Other Treatments:     Therapy/Group: Individual Therapy  Tonny Branch 02/28/2020, 6:48 AM

## 2020-02-28 NOTE — Progress Notes (Signed)
Physical Therapy Session Note  Patient Details  Name: Carrie Cook MRN: [id number] Date of Birth: [birth year]  Today's Date: 02/28/2020 PT Individual Time: 0900-0940 PT Individual Time Calculation (min): 40 min   Short Term Goals: Week 1:  PT Short Term Goal 1 (Week 1): STG=LTG due to LOS  Skilled Therapeutic Interventions/Progress Updates:  Pt received seated in w/c in room, agreeable to PT session. Pt reports 5/10 pain in low back and into RLE at rest, receives medication at beginning of therapy session. Sit to stand with CGA to RW throughout session. Ambulation 2 x 200 ft with RW and close SBA to CGA. Static standing balance with no UE support and CGA to min A while performing ball toss reaching outside BOS and performing "chest pass". Pt exhibits occasional LOB posteriorly that she is able to correct with min A. Standing squats 3 x 10 reps with RW and CGA for support, cues for correct exercise performance. Pt reports onset of R sciatic nerve pain while performing squats. Demonstrated how to perform nerve glide in seated position with knee extension and neck flexion/extension. Pt reports some relief of nerve pain with glides. Pt requests to return to bed at end of session. Sit to supine with CGA for LE management. Pt left semi-reclined in bed with needs in reach, bed alarm in place at end of session.  Therapy Documentation Precautions:  Precautions Precautions: Back,Fall Precaution Booklet Issued: No Precaution Comments: Pt is able to verbalize back precautions but requires cues to maintain during session Required Braces or Orthoses:  (no brace needs per order) Restrictions Weight Bearing Restrictions: No   Therapy/Group: Individual Therapy   Excell Seltzer, PT, DPT  02/28/2020, 4:15 PM

## 2020-02-29 LAB — GLUCOSE, CAPILLARY
Glucose-Capillary: 116 mg/dL — ABNORMAL HIGH (ref 70–99)
Glucose-Capillary: 99 mg/dL (ref 70–99)
Glucose-Capillary: 122 mg/dL — ABNORMAL HIGH (ref 70–99)
Glucose-Capillary: 95 mg/dL (ref 70–99)

## 2020-02-29 NOTE — Progress Notes (Signed)
[facility] PHYSICAL MEDICINE & REHABILITATION PROGRESS NOTE  Subjective/Complaints: Patient seen sitting up in bed this morning.  She states she slept well overnight.  She has questions regarding nerve pain, which is improving.  She also notes improvement in strength.  ROS: Denies CP, SOB, N/V/D  Objective: Vital Signs: Blood pressure (!) 148/92, pulse 87, temperature 98.2 F (36.8 C), temperature source Oral, resp. rate 18, height 5\' 3"  (1.6 m), weight 83.8 kg, last menstrual period 07/10/2013, SpO2 98 %. No results found. No results for input(s): WBC, HGB, HCT, PLT in the last 72 hours. No results for input(s): NA, K, CL, CO2, GLUCOSE, BUN, CREATININE, CALCIUM in the last 72 hours.  Intake/Output Summary (Last 24 hours) at 02/29/2020 0827 Last data filed at 02/28/2020 2152 Gross per 24 hour  Intake 360 ml  Output --  Net 360 ml        Physical Exam: BP (!) 148/92 (BP Location: Right Arm)   Pulse 87   Temp 98.2 F (36.8 C) (Oral)   Resp 18   Ht 5\' 3"  (1.6 m)   Wt 83.8 kg   LMP 07/10/2013   SpO2 98%   BMI 32.72 kg/m  Constitutional: No distress . Vital signs reviewed. HENT: Normocephalic.  Atraumatic. Eyes: EOMI. No discharge. Cardiovascular: No JVD.  RRR. Respiratory: Normal effort.  No stridor.  Bilateral clear to auscultation. GI: Non-distended.  BS +. Skin: Warm and dry.   Back incision CDI Psych: Normal mood.  Normal behavior. Musc: No edema in extremities.  No tenderness in extremities. Neuro: Alert Motor: Bilateral upper extremities: 5/5 proximal and distal Right lower extremity: Hip flexion, knee extension 4+/5, ankle dorsiflexion 4/5 Left lower extremity: Hip flexion, knee extension 4/5, ankle dorsiflexion 4 -/5 Sensation diminished to light touch left toes  Assessment/Plan: 1. Functional deficits which require 3+ hours per day of interdisciplinary therapy in a comprehensive inpatient rehab setting.  Physiatrist is providing close team supervision and 24  hour management of active medical problems listed below.  Physiatrist and rehab team continue to assess barriers to discharge/monitor patient progress toward functional and medical goals   Care Tool:  Bathing    Body parts bathed by patient: Right arm,Left arm,Chest,Abdomen,Front perineal area,Buttocks,Right upper leg,Left upper leg,Face,Right lower leg,Left lower leg   Body parts bathed by helper: Right lower leg,Left lower leg     Bathing assist Assist Level: Minimal Assistance - Patient > 75%     Upper Body Dressing/Undressing Upper body dressing   What is the patient wearing?: Pull over shirt    Upper body assist Assist Level: Supervision/Verbal cueing    Lower Body Dressing/Undressing Lower body dressing      What is the patient wearing?: Underwear/pull up,Pants     Lower body assist Assist for lower body dressing: Independent     Toileting Toileting    Toileting assist Assist for toileting: Independent with assistive device     Transfers Chair/bed transfer  Transfers assist     Chair/bed transfer assist level: Contact Guard/Touching assist     Locomotion Ambulation   Ambulation assist   Ambulation activity did not occur: N/A  Assist level: Contact Guard/Touching assist Assistive device: Walker-rolling Max distance: 150   Walk 10 feet activity   Assist  Walk 10 feet activity did not occur: N/A  Assist level: Contact Guard/Touching assist Assistive device: Walker-rolling   Walk 50 feet activity   Assist Walk 50 feet with 2 turns activity did not occur: N/A  Assist level: Contact Guard/Touching  assist Assistive device: Walker-rolling    Walk 150 feet activity   Assist Walk 150 feet activity did not occur: N/A  Assist level: Contact Guard/Touching assist Assistive device: Walker-rolling    Walk 10 feet on uneven surface  activity   Assist Walk 10 feet on uneven surfaces activity did not occur: N/A   Assist level: Moderate  Assistance - Patient - 50 - 74% Assistive device: Aeronautical engineer Will patient use wheelchair at discharge?: No Type of Wheelchair: Manual    Wheelchair assist level: Supervision/Verbal cueing Max wheelchair distance: 173ft    Wheelchair 50 feet with 2 turns activity    Assist        Assist Level: Supervision/Verbal cueing   Wheelchair 150 feet activity     Assist      Assist Level: Supervision/Verbal cueing    Medical Problem List and Plan: 1.  B/L LE weakness, Left > Right with impaired function/ADLs/mobility s/p microdiscectomy and open decompression (L3-L5) secondary to Lumbar stenosis with myelopathy.   Continue CIR 2.  Antithrombotics: -DVT/anticoagulation:  Pharmaceutical: Lovenox             -antiplatelet therapy: N/a 3. Pain Management:              On Baclofen tid for muscle spasms.              Neurontin 400 mg qid             Oxycodone prn  Controlled with meds on 2/6  Monitor with increased exertion 4. Mood: LCSW to follow for evaluation and support.              -antipsychotic agents: See #11 5. Neuropsych: This patient is capable of making decisions on her own behalf. 6. Skin/Wound Care: Routine pressure relief measures.  7. Fluids/Electrolytes/Nutrition: Monitor I/Os.              Added prosource to promote healing.  8.  Drug-induced constipation:   Continue Miralax daily  Senna S DC'd on 2/20  Bowel meds decreased on 2/3, DC'd on 2/6  Appears to be improving  Adjust bowel meds as necessary 9. HTN: Monitor BP tid--continue Norvasc, Cozaar, Clonidine.   Relatively controlled on 2/6  Orthostatics ordered, pending             Monitor increase mobility 10. T2DM with hyperglycemia: Hgb A1c-6.4 and controlled. Will monitor BS ac/hs--use SSI prn for better control.               Continue metformin bid.              Resumed Lantus 5 units at bedtime  Mildly elevated on 2/6  Monitor the increased mobility 11.  Anxiety/depression: On Effexor bid, Lamictal bid for mood stabilization.               She had stopped taking Abilify X 5 month-->doesn't want it.  12. H/o Gastric bypass: Had stopped taking her vitamins. Continue multivitamin.   [facility]ce[facility].    13. Leucocytosis: Resolved 14. Muscle spasms  See #3 15. Urinary retention-?  Resolved  PVRs unremarkable.  16.  Hypoalbuminemia  Supplement initiated  LOS: 5 days A FACE TO FACE EVALUATION WAS PERFORMED  Carrie Cook Carrie Cook 02/29/2020, 8:27 AM

## 2020-02-29 NOTE — Progress Notes (Signed)
Occupational Therapy Session Note  Patient Details  Name: Carrie Cook MRN: [id number] Date of Birth: Apr 04, 1960  Today's Date: 02/29/2020 OT Individual Time: 0630-1601 OT Individual Time Calculation (min): 60 min    Short Term Goals: Week 1:  OT Short Term Goal 1 (Week 1): STG = LTGs due to ELOS  Skilled Therapeutic Interventions/Progress Updates:    Pt received supine with no c/o pain, agreeable to OT session focused on shower. Pt completed bed mobility with supervision with HOB elevated and use of bed rail. She stood and completed ambulatory transfer into the bathroom with supervision overall. Min cueing for safety when doffing clothing. Pt completed bathing seated on shower seat with close supervision. Problem solved through shaving legs with adherence to back precautions, which pt was able to do with min A overall using figure 4 method. Slight twisting occurring during posterior hygiene/cleansing, discussed precautions and alternative solutions. Discussed home set up and shower transfers at home. Pt returned to EOB and dressed with supervision overall. Pt stood at the sink with supervision for oral care. She was left sitting up in the w/c with all needs met.   Therapy Documentation Precautions:  Precautions Precautions: Back,Fall Precaution Booklet Issued: No Precaution Comments: Pt is able to verbalize back precautions but requires cues to maintain during session Required Braces or Orthoses:  (no brace needs per order) Restrictions Weight Bearing Restrictions: No  Therapy/Group: Individual Therapy  Curtis Sites 02/29/2020, 6:34 AM

## 2020-03-01 LAB — CBC
HCT: 41.4 % (ref 36.0–46.0)
Hemoglobin: 13.9 g/dL (ref 12.0–15.0)
MCH: 30.8 pg (ref 26.0–34.0)
MCHC: 33.6 g/dL (ref 30.0–36.0)
MCV: 91.6 fL (ref 80.0–100.0)
Platelets: 280 10*3/uL (ref 150–400)
RBC: 4.52 MIL/uL (ref 3.87–5.11)
RDW: 11.7 % (ref 11.5–15.5)
WBC: 7.8 10*3/uL (ref 4.0–10.5)
nRBC: 0 % (ref 0.0–0.2)

## 2020-03-01 LAB — GLUCOSE, CAPILLARY
Glucose-Capillary: 128 mg/dL — ABNORMAL HIGH (ref 70–99)
Glucose-Capillary: 124 mg/dL — ABNORMAL HIGH (ref 70–99)
Glucose-Capillary: 172 mg/dL — ABNORMAL HIGH (ref 70–99)
Glucose-Capillary: 137 mg/dL — ABNORMAL HIGH (ref 70–99)

## 2020-03-01 MED ORDER — AMITRIPTYLINE HCL 10 MG PO TABS
10.0000 mg | ORAL_TABLET | Freq: Every day | ORAL | Status: DC
  Administered 2020-03-01 – 2020-03-05 (×5): 10 mg via ORAL
  Filled 2020-03-01 (×5): qty 1

## 2020-03-01 NOTE — Progress Notes (Signed)
Occupational Therapy Session Note  Patient Details  Name: Carrie Cook MRN: [id number] Date of Birth: February 16, 1960  Today's Date: 03/01/2020 OT Individual Time: 0930-1030 OT Individual Time Calculation (min): 60 min    Short Term Goals: Week 1:  OT Short Term Goal 1 (Week 1): STG = LTGs due to ELOS  Skilled Therapeutic Interventions/Progress Updates:    Treatment session with focus on functional transfers and modifications to seating in home to increase independence and adherence to back precautions.  Pt asking questions about her couch at home, discussed modifications to provide increased stability at cushions and increase height of couch.  Pt completed couch and recliner transfers in ADL apt with CGA with RW.  Pt tearful when discussing all recent medical issues and loss of independence.  Reiterated scheduled neuropsych visit and support systems in place and other resources available to pt.  Pt completed car transfer to [place] height with RW with close supervision and cues to adhere to back precautions during mobility.  Pt returned to room and remained upright in w/c with chair alarm on and all needs in reach.  Therapy Documentation Precautions:  Precautions Precautions: Back,Fall Precaution Booklet Issued: No Precaution Comments: Pt is able to verbalize back precautions but requires cues to maintain during session Required Braces or Orthoses:  (no brace needs per order) Restrictions Weight Bearing Restrictions: No Pain: Pain Assessment Pain Scale: 0-10 Pain Score: 0-No pain    Therapy/Group: Individual Therapy  Simonne Come 03/01/2020, 12:31 PM

## 2020-03-01 NOTE — Consult Note (Signed)
Neuropsychological Consultation   Patient:   Carrie Cook   DOB:   1960/12/31  MR Number:  [id number]  Location:  [facility] [id number] [place] [address] Dept: [place]: [id number]           Date of Service:   03/01/2020  Start Time:   11 AM End Time:   12 PM  Provider/Observer:  Ilean Skill, Psy.D.       Clinical Neuropsychologist       Billing Code/Service: 64332  Chief Complaint:    Carrie Cook is a 60 year old female with history of hypertension, type 2 diabetes, right total knee replacement done in June 2021, gastric bypass surgery, depression and anxiety, right lower extremity pain with work-up revealing moderate to severe spinal stenosis L3-4.  Patient underwent L3/4 and L4/5 decompression on 1/19.  Patient was readmitted on 1/21 due to new onset of left foot drop, severe muscle spasms, severe pain and fall.  MRI done revealing spinal canal narrowing at L3-L5 levels with question of postprocedural edema contributing to stenosis.  Patient underwent bilateral L3/4 and L4/5 decompressive surgery on 1/22.  Postoperatively the patient had neuropathy and left lower extremity weakness that has been improving but continued with severe muscle spasms.  Patient was also found to be incidentally Covid positive and continues to be asymptomatic with isolation for 10 days.  Spasms have resolved and patient had limits due to bilateral lower extremity weakness with ataxia and sensory deficits along with balance deficits.  CIR was recommended.  Reason for Service:  Patient was referred for neuropsychological consultation due to history of depression and anxiety in a setting of residual effects of spinal stenosis with surgery and some postoperative complications.  Below is the HPI for the current admission.  HPI: Carrie Cook is a 60 year old female with history of HTN, T2DM, R-TKR  (done 6/21) 06/2019, gastric bypass, Vit D deficiency, depression/anxiety, RLE pain with work up revealing moderate to severe spinal stenosis L3-4. She underwent L3/4 and L4/5 decompression on 01/19 at surgical center by Dr. Zada Finders. She was readmitted 01/21 due to new onset of left foot drop, severe muscle spasms, severe pain and fall. Marland Kitchen MRI spine done revealing severe spinal canal narrowing at L3-L5 levels with increased conspicuity of epidural fat--question postprocedural edema contributing to stenosis. She underwent bilateral L3/4 and L4/5 facetectomy with decompression of thecal nerve roots on 01/22.  Post op, neuropathy and LLE weakness improving but has had severe muscle spasms requiring multiple medication adjustment. She was also found to be Covid + but has been asymptomatic and has been on isolation X 10 days.    Spasms have resolved (last this Sunday) with addition of baclofen and finally had BM after 10 days. Intake good. She continues to have cough as well as nausea with activity. Therapy has been ongoing and patient limited by BLE weakness with ataxia and sensory deficits, balance deficits with anterior lean as well as reports of nausea with OOBactivity OOB/dizziness with upward gaze. CIR Recommended due to functional decline.   Current Status:  The patient was alert and oriented and sitting in her wheelchair upon entering the room.  The patient reports that her pain symptoms have improved significantly but she continues to have weakness and some ongoing neuropathy.  The patient reports that she is moving all of her fingers and toes and able to stand and has been seeing ongoing improvement.  The patient reports that she has been very upset and depression/anxiety has been worsened with her significant medical issues.  The patient reports that she had a very hard time recovering from her total knee replacement and whenever she starts to improve something new will happen.  The patient reports that  she was not expecting the degree of difficulties and complications with her surgery for spinal stenosis.  She reports that it was very challenging identifying what the issue was initially and there was attempts for the patient to go through therapy first before getting MRI.  She reports that she became worse during therapy which further frustrated her.  When she had the surgery she felt like it was hard for people to except that she was having significant complications that were later confirmed.  The patient reports that she was very stressed when she was seen in the emergency department after her original surgery having to call EMS as she was not able to get into her vehicle.  The patient understands the challenges and complexities that are happening in emergency departments now.  The patient also was confused as to how she got Covid but likely she was vaccinated and remained asymptomatic throughout.  The patient reports that her mood has been improving recently with improvements in her functioning but she is aware of the expectations of a long recovery but still hopes to be able to return to more normal functioning down the road.  The patient reports that as she is seeing improvements in her overall functioning that her motivation is stable positive although depression and worry about continued debilitating symptoms remain.    Behavioral Observation: Carrie Cook  presents as a 60 y.o.-year-old Right Caucasian Female who appeared her stated age. her dress was Appropriate and she was Well Groomed and her manners were Appropriate to the situation.  her participation was indicative of Appropriate and Attentive behaviors.  There were physical disabilities noted.  she displayed an appropriate level of cooperation and motivation.     Interactions:    Active Appropriate  Attention:   within normal limits and attention span and concentration were age appropriate  Memory:   within normal limits; recent and  remote memory intact  Visuo-spatial:  not examined  Speech (Volume):  normal  Speech:   normal; normal  Thought Process:  Coherent and Relevant  Though Content:  WNL; not suicidal and not homicidal  Orientation:   person, place, time/date and situation  Judgment:   Good  Planning:   Fair  Affect:    Anxious and Depressed  Mood:    Dysphoric  Insight:   Good  Intelligence:   normal  Medical History:   Past Medical History:  Diagnosis Date  . Anemia    PT HAS HAD COLONOSCOPY AND ENDOSCOPY WORK UP - NO PROBLEMS FOUND AS SOURCE OF ANEMIA -- PT HAD IRON INFUSION AT [facility] 11/13/12-AFTER SEEING HEMATOLOGIST DR. [place]atogue AND HE GAVE HEMATOLOGIC CLEARANCE FOR GASTRIC BYPASS SURGERY.  Marland Kitchen Anxiety   . Asthma    daily and prn inhalers  . Degenerative joint disease    right knee, spine - STATES INTERMITTENT  NUMBNESS DOWN RT LEG WITH PROLONGED STANDING OR WALKING- THINKS RELATED TO HER SPINE PROBLEMS  . Dental crowns present   . Depression   . Family history of anesthesia complication    pt's mother and sister have hx. of post-op N/V  . Fibroids    UTERINE  . Gastroesophageal reflux disease  none for 2 years since Bariatric surgery.  . H/O hiatal hernia   . History of endometriosis   . History of kidney stones   . Hyperlipidemia   . Hypertension    under control with med., has been on med. x 2 yr.  . Insulin dependent diabetes mellitus   . Lock jaw    jaw locks open if opens mouth wide  . Migraines   . Neuropathy    FEET  . Obesity   . Palpitations   . PONV (postoperative nausea and vomiting)   . Shortness of breath    with daily activities  . Sleep apnea    post-op didn't need CPAP but now states needs it again but not using         Patient Active Problem List   Diagnosis Date Noted  . Myelopathy concurrent with and due to stenosis of lumbar spine ([place]) 02/25/2020  . Hypoalbuminemia due to protein-calorie malnutrition ([place])   . Muscle spasm   .  Controlled type 2 diabetes mellitus with hyperglycemia, without long-term current use of insulin ([place])   . Essential hypertension   . Drug induced constipation   . Postoperative pain   . Lumbar disc herniation with myelopathy 02/24/2020  . Left foot drop 02/14/2020  . Lumbar spinal stenosis 02/14/2020  . Lumbar radiculopathy 02/13/2020  . Acute right-sided back pain with sciatica 10/02/2019  . S/P total knee replacement, right 07/22/19  09/01/2019  . Primary localized osteoarthritis of left knee 07/22/2019  . Primary osteoarthritis of right knee   . Constipation 09/05/2018  . Abdominal pain, chronic, epigastric 05/27/2018  . Dysphagia 05/27/2018  . Depression, major, single episode, moderate ([place]) 03/22/2018  . S/P complete hysterectomy 06/06/2017  . Derangement of posterior horn of medial meniscus of right knee   . Right knee pain 09/10/2015  . Iron deficiency anemia 10/16/2013  . Fibroids 08/21/2013  . Excessive or frequent menstruation 06/19/2013  . Morbid obesity ([place]) 07/12/2012  . Obstructive sleep apnea 06/21/2012  . Chest pain   . Hyperlipidemia   . Type 2 diabetes mellitus ([place])   . Obesity   . Laboratory test   . Nephrolithiasis   . Asthma   . Endometriosis   . Hypertension   . Iron deficiency anemia 05/11/2009  . GERD 05/11/2009     Psychiatric History:  Patient does have a past history of depression anxiety that have been exacerbated by her significant orthopedic and neurologic complications.  Family Med/Psych History:  Family History  Problem Relation Age of Onset  . Hypertension Father   . Hyperlipidemia Father   . COPD Father   . COPD Mother   . Heart disease Mother   . Cancer Mother        Cervix  . Anesthesia problems Mother        post-op N/V  . Thyroid disease Mother   . Diabetes Maternal Grandfather   . Thyroid disease Sister   . Anesthesia problems Sister        post-op N/V  . Thyroid disease Paternal Uncle   . Diabetes Other   . Colon  cancer Neg Hx   . Colon polyps Neg Hx    Impression/DX:  Carrie Cook is a 60 year old female with history of hypertension, type 2 diabetes, right total knee replacement done in June 2021, gastric bypass surgery, depression and anxiety, right lower extremity pain with work-up revealing moderate to severe spinal stenosis L3-4.  Patient underwent L3/4 and L4/5 decompression on  1/19.  Patient was readmitted on 1/21 due to new onset of left foot drop, severe muscle spasms, severe pain and fall.  MRI done revealing spinal canal narrowing at L3-L5 levels with question of postprocedural edema contributing to stenosis.  Patient underwent bilateral L3/4 and L4/5 decompressive surgery on 1/22.  Postoperatively the patient had neuropathy and left lower extremity weakness that has been improving but continued with severe muscle spasms.  Patient was also found to be incidentally Covid positive and continues to be asymptomatic with isolation for 10 days.  Spasms have resolved and patient had limits due to bilateral lower extremity weakness with ataxia and sensory deficits along with balance deficits.  CIR was recommended.  The patient was alert and oriented and sitting in her wheelchair upon entering the room.  The patient reports that her pain symptoms have improved significantly but she continues to have weakness and some ongoing neuropathy.  The patient reports that she is moving all of her fingers and toes and able to stand and has been seeing ongoing improvement.  The patient reports that she has been very upset and depression/anxiety has been worsened with her significant medical issues.  The patient reports that she had a very hard time recovering from her total knee replacement and whenever she starts to improve something new will happen.  The patient reports that she was not expecting the degree of difficulties and complications with her surgery for spinal stenosis.  She reports that it was very challenging  identifying what the issue was initially and there was attempts for the patient to go through therapy first before getting MRI.  She reports that she became worse during therapy which further frustrated her.  When she had the surgery she felt like it was hard for people to except that she was having significant complications that were later confirmed.  The patient reports that she was very stressed when she was seen in the emergency department after her original surgery having to call EMS as she was not able to get into her vehicle.  The patient understands the challenges and complexities that are happening in emergency departments now.  The patient also was confused as to how she got Covid but likely she was vaccinated and remained asymptomatic throughout.  The patient reports that her mood has been improving recently with improvements in her functioning but she is aware of the expectations of a long recovery but still hopes to be able to return to more normal functioning down the road.  The patient reports that as she is seeing improvements in her overall functioning that her motivation is stable positive although depression and worry about continued debilitating symptoms remain.  Disposition/Plan:  Today we worked on coping and adjustment issues and assessed her anxiety and depression.  The patient reports that her depressive symptomatology had not gotten to the point that if limited her in therapeutic efforts and she remains motivated and is seeing functional gains through therapy.  The patient I discussed what signs or symptoms she should look for regarding any potential mood changes that should be acutely addressed if they are impacting her ability to fully benefit and engage in therapeutic efforts.          Electronically Signed   _______________________ Ilean Skill, Psy.D. Clinical Neuropsychologist

## 2020-03-01 NOTE — Progress Notes (Signed)
[facility] PHYSICAL MEDICINE & REHABILITATION PROGRESS NOTE  Subjective/Complaints: Feeling well this morning Diastolic BP elevated Had to take oxycodone last night to help her sleep due to neuropathic pain. Discussed trying Amitriptyline tonight and she is agreeable.   ROS: Denies CP, SOB, N/V/D  Objective: Vital Signs: Blood pressure (!) 137/98, pulse 76, temperature 98.5 F (36.9 C), resp. rate 18, height 5\' 3"  (1.6 m), weight 83.8 kg, last menstrual period 07/10/2013, SpO2 98 %. No results found. Recent Labs    03/01/20 0629  WBC 7.8  HGB 13.9  HCT 41.4  PLT 280   No results for input(s): NA, K, CL, CO2, GLUCOSE, BUN, CREATININE, CALCIUM in the last 72 hours.  Intake/Output Summary (Last 24 hours) at 03/01/2020 1119 Last data filed at 03/01/2020 0720 Gross per 24 hour  Intake 880 ml  Output --  Net 880 ml        Physical Exam: BP (!) 137/98 (BP Location: Left Arm)   Pulse 76   Temp 98.5 F (36.9 C)   Resp 18   Ht 5\' 3"  (1.6 m)   Wt 83.8 kg   LMP 07/10/2013   SpO2 98%   BMI 32.72 kg/m  Gen: no distress, normal appearing HEENT: oral mucosa pink and moist, NCAT Cardio: Reg rate Chest: normal effort, normal rate of breathing Abd: soft, non-distended Ext: no edema Psych: pleasant, normal affect Skin:  Back incision CDI Psych: Normal mood.  Normal behavior. Musc: No edema in extremities.  No tenderness in extremities. Neuro: Alert Motor: Bilateral upper extremities: 5/5 proximal and distal Right lower extremity: Hip flexion, knee extension 4+/5, ankle dorsiflexion 4/5 Left lower extremity: Hip flexion, knee extension 4/5, ankle dorsiflexion 4 -/5 Sensation diminished to light touch left toes  Assessment/Plan: 1. Functional deficits which require 3+ hours per day of interdisciplinary therapy in a comprehensive inpatient rehab setting.  Physiatrist is providing close team supervision and 24 hour management of active medical problems listed  below.  Physiatrist and rehab team continue to assess barriers to discharge/monitor patient progress toward functional and medical goals   Care Tool:  Bathing    Body parts bathed by patient: Right arm,Left arm,Chest,Abdomen,Front perineal area,Buttocks,Right upper leg,Left upper leg,Face,Right lower leg,Left lower leg   Body parts bathed by helper: Right lower leg,Left lower leg     Bathing assist Assist Level: Minimal Assistance - Patient > 75%     Upper Body Dressing/Undressing Upper body dressing   What is the patient wearing?: Pull over shirt    Upper body assist Assist Level: Supervision/Verbal cueing    Lower Body Dressing/Undressing Lower body dressing      What is the patient wearing?: Underwear/pull up,Pants     Lower body assist Assist for lower body dressing: Independent     Toileting Toileting    Toileting assist Assist for toileting: Independent with assistive device     Transfers Chair/bed transfer  Transfers assist     Chair/bed transfer assist level: Contact Guard/Touching assist     Locomotion Ambulation   Ambulation assist   Ambulation activity did not occur: N/A  Assist level: Contact Guard/Touching assist Assistive device: Walker-rolling Max distance: 154   Walk 10 feet activity   Assist  Walk 10 feet activity did not occur: N/A  Assist level: Contact Guard/Touching assist Assistive device: Walker-rolling   Walk 50 feet activity   Assist Walk 50 feet with 2 turns activity did not occur: N/A  Assist level: Contact Guard/Touching assist Assistive device: Walker-rolling  Walk 150 feet activity   Assist Walk 150 feet activity did not occur: N/A  Assist level: Contact Guard/Touching assist Assistive device: Walker-rolling    Walk 10 feet on uneven surface  activity   Assist Walk 10 feet on uneven surfaces activity did not occur: N/A   Assist level: Moderate Assistance - Patient - 50 - 74% Assistive  device: Aeronautical engineer Will patient use wheelchair at discharge?: No Type of Wheelchair: Manual    Wheelchair assist level: Supervision/Verbal cueing Max wheelchair distance: 131ft    Wheelchair 50 feet with 2 turns activity    Assist        Assist Level: Supervision/Verbal cueing   Wheelchair 150 feet activity     Assist      Assist Level: Supervision/Verbal cueing    Medical Problem List and Plan: 1.  B/L LE weakness, Left > Right with impaired function/ADLs/mobility s/p microdiscectomy and open decompression (L3-L5) secondary to Lumbar stenosis with myelopathy.   Continue CIR 2.  Antithrombotics: -DVT/anticoagulation:  Pharmaceutical: Lovenox             -antiplatelet therapy: N/a 3. Pain Management:              On Baclofen tid for muscle spasms.              Neurontin 400 mg qid             Oxycodone prn  2/7: required oxycodone last night to help her sleep due to her sciatic pain. Discussed trying Amitriptyline 10mg  HS and she is agreeable  Monitor with increased exertion 4. Mood: LCSW to follow for evaluation and support.              -antipsychotic agents: See #11 5. Neuropsych: This patient is capable of making decisions on her own behalf. 6. Skin/Wound Care: Routine pressure relief measures.  7. Fluids/Electrolytes/Nutrition: Monitor I/Os.              Added prosource to promote healing.  8.  Drug-induced constipation:   Continue Miralax daily  Senna S DC'd on 2/20  Bowel meds decreased on 2/3, DC'd on 2/6  2/7: having 1 loose stool per day, continue to monitor.  9. HTN: Monitor BP tid--continue Norvasc, Cozaar, Clonidine.   2/7: diastolic BP elevated at times, continue to monitor.  Orthostatics ordered, pending             Monitor increase mobility 10. T2DM with hyperglycemia: Hgb A1c-6.4 and controlled. Will monitor BS ac/hs--use SSI prn for better control.               Continue metformin bid.              Resumed  Lantus 5 units at bedtime  Mildly elevated on 2/6  Monitor the increased mobility 11. Anxiety/depression: On Effexor bid, Lamictal bid for mood stabilization.               She had stopped taking Abilify X 5 month-->doesn't want it.  12. H/o Gastric bypass: Had stopped taking her vitamins. Continue multivitamin.   [facility].    13. Leucocytosis: Resolved 14. Muscle spasms  See #3 15. Urinary retention-?  Resolved  PVRs unremarkable.  16.  Hypoalbuminemia  Supplement initiated  LOS: 6 days A FACE TO FACE EVALUATION WAS PERFORMED  Verdia Bolt P Karter Hellmer 03/01/2020, 11:19 AM

## 2020-03-01 NOTE — Progress Notes (Signed)
Physical Therapy Session Note  Patient Details  Name: Carrie Cook MRN: [id number] Date of Birth: 1960/02/19  Today's Date: 03/01/2020 PT Individual Time: 1300-1408 PT Individual Time Calculation (min): 68 min   Short Term Goals: Week 1:  PT Short Term Goal 1 (Week 1): STG=LTG due to LOS  Skilled Therapeutic Interventions/Progress Updates:   Received pt sitting in WC, pt agreeable to therapy, and reported pain in low back from sitting in [facility] but otherwise none. Session with emphasis on functional mobility/transfers, generalized strengthening, dynamic standing balance/coordination, gait training, NMR and balance, and improved activity tolerance. Pt performed WC mobility 12ft using BUE and supervision to therapy gym. Pt ambulated 259ft with RW and supervision. Pt with 1 posterior LOB when turning requiring CGA. Pt continues to demonstrate bilateral knee flexion in stance, flexed trunk, and decreased stride length with gait. Pt performed lateral side stepping along rail x 57ft with BUE support and CGA. Pt demonstrated improvements in balance and hip abductor strength. Pt performed additional side stepping for 26ft with light orange TB around thighs, however demonstrated significant Trendelenburg hip drop; therefore removed TB. Pt performed high knee walking inside // bars x 20ft with BUE support and CGA/min A for balance. Pt with 3 mild posterior LOB but able to self-correct. Worked on retro walking inside // bars x 14ft with BUE support and CGA/min A with 2 lateral LOB but, again pt able to self-correct. Worked on static standing balance on Rockerboard inside // bars without UE support and close supervision for balance x 1 minute. Pt then performed lateral taps on Rockerboard with BUE support and CGA/min A x10 reps each. Pt transported to dayroom in [facility] total A and transferred sit<>stand without AD and CGA and worked on dyamic standing balance tossing ball against wall 2x20 reps with CGA for balance with  no LOB noted! Pt ambulated 129ft with RW and supervision/CGA back to room. Therapist incorporated sudden stops, 360 degree turns, and up/down/latral head tilts for increased challenge. Pt transferred sit<>supine with supervision and NT present to assess vitals. Concluded session with pt supine in bed, needs within reach, and bed alarm on.   Therapy Documentation Precautions:  Precautions Precautions: Back,Fall Precaution Booklet Issued: No Precaution Comments: Pt is able to verbalize back precautions but requires cues to maintain during session Required Braces or Orthoses:  (no brace needs per order) Restrictions Weight Bearing Restrictions: No  Therapy/Group: Individual Therapy Alfonse Alpers PT, DPT   03/01/2020, 7:25 AM

## 2020-03-01 NOTE — Progress Notes (Signed)
Physical Therapy Session Note  Patient Details  Name: Carrie Cook MRN: [id number] Date of Birth: Jan 16, 1961  Today's Date: 03/01/2020 PT Individual Time: 0803-0850 PT Individual Time Calculation (min): 47 min   Short Term Goals: Week 1:  PT Short Term Goal 1 (Week 1): STG=LTG due to LOS  Skilled Therapeutic Interventions/Progress Updates: Pt presents sitting in w/c, beginning to dress.  Pt retrieved own clothes from cabinet at w/c level and able to doff pjs and don bra and sit independently.  Pt stood from w/c w/ CGA to doff pants, stepping out of pants and then sat in w/c to thread pants over LEs and then stood again to pull over hips.  Pt required verbal cues for safety, attemping to step outside of RW to grab mask and comb.  Pt wheeled self to dayroom w/ B UEs and supervision.  Pt amb multiple trials w/ RW up to 154' per trial.  Pt requires occasional verbal cues for safe approach to seat.  Pt performed Nu-step at level 4 x 6' w/ decrease in intensity to 2.  Pt remained in the 50's for steps per.  Pt performed sit to stand transfers w/ CGA and increased time.  Pt performed negotiation through cones w/ CGA and occasional verbal cues for walker management.  Pt also amb through sidestepping to improve SLS, and toe taps to 3" step.  Pt returned to room and remained in w/c w/ seat alarm on and all needs in reach.     Therapy Documentation Precautions:  Precautions Precautions: Back,Fall Precaution Booklet Issued: No Precaution Comments: Pt is able to verbalize back precautions but requires cues to maintain during session Required Braces or Orthoses:  (no brace needs per order) Restrictions Weight Bearing Restrictions: No General:   Vital Signs:  Pain:5/10, w/ meds given.      Therapy/Group: Individual Therapy  Ladoris Gene 03/01/2020, 8:59 AM

## 2020-03-02 LAB — GLUCOSE, CAPILLARY
Glucose-Capillary: 121 mg/dL — ABNORMAL HIGH (ref 70–99)
Glucose-Capillary: 89 mg/dL (ref 70–99)
Glucose-Capillary: 149 mg/dL — ABNORMAL HIGH (ref 70–99)
Glucose-Capillary: 99 mg/dL (ref 70–99)

## 2020-03-02 MED ORDER — METFORMIN HCL 850 MG PO TABS
850.0000 mg | ORAL_TABLET | Freq: Two times a day (BID) | ORAL | Status: DC
Start: 2020-03-02 — End: 2020-03-06
  Administered 2020-03-02 – 2020-03-06 (×8): 850 mg via ORAL
  Filled 2020-03-02 (×8): qty 1

## 2020-03-02 MED ORDER — LIDOCAINE 5 % EX PTCH
1.0000 | MEDICATED_PATCH | CUTANEOUS | Status: DC
  Administered 2020-03-03 – 2020-03-06 (×4): 1 via TRANSDERMAL
  Filled 2020-03-02 (×4): qty 1

## 2020-03-02 NOTE — Progress Notes (Signed)
[facility] PHYSICAL MEDICINE & REHABILITATION PROGRESS NOTE  Subjective/Complaints: Patient seen sitting up in bed this morning.  She states she slept well overnight.  She notes improvement in pain.  She states she is getting stronger.  She notes improvement in bowel movements.  ROS: Denies CP, SOB, N/V/D  Objective: Vital Signs: Blood pressure 134/83, pulse 77, temperature 98.1 F (36.7 C), resp. rate 16, height 5\' 3"  (1.6 m), weight 83.8 kg, last menstrual period 07/10/2013, SpO2 98 %. No results found. Recent Labs    03/01/20 0629  WBC 7.8  HGB 13.9  HCT 41.4  PLT 280   No results for input(s): NA, K, CL, CO2, GLUCOSE, BUN, CREATININE, CALCIUM in the last 72 hours.  Intake/Output Summary (Last 24 hours) at 03/02/2020 0837 Last data filed at 03/02/2020 0700 Gross per 24 hour  Intake 740 ml  Output -  Net 740 ml        Physical Exam: BP 134/83 (BP Location: Left Arm)   Pulse 77   Temp 98.1 F (36.7 C)   Resp 16   Ht 5\' 3"  (1.6 m)   Wt 83.8 kg   LMP 07/10/2013   SpO2 98%   BMI 32.72 kg/m  Constitutional: No distress . Vital signs reviewed. HENT: Normocephalic.  Atraumatic. Eyes: EOMI. No discharge. Cardiovascular: No JVD.  RRR. Respiratory: Normal effort.  No stridor.  Bilateral clear to auscultation. GI: Non-distended.  BS +. Skin: Warm and dry.  Intact. Psych: Normal mood.  Normal behavior. Musc: No edema in extremities.  No tenderness in extremities. Neuro: Alert Motor: Bilateral upper extremities: 5/5 proximal and distal Right lower extremity: Hip flexion, knee extension 4+/5, ankle dorsiflexion 4+/5 Left lower extremity: Hip flexion, knee extension 4/5, ankle dorsiflexion 4/5 Sensation diminished to light touch left toes  Assessment/Plan: 1. Functional deficits which require 3+ hours per day of interdisciplinary therapy in a comprehensive inpatient rehab setting.  Physiatrist is providing close team supervision and 24 hour management of active medical  problems listed below.  Physiatrist and rehab team continue to assess barriers to discharge/monitor patient progress toward functional and medical goals   Care Tool:  Bathing    Body parts bathed by patient: Right arm,Left arm,Chest,Abdomen,Front perineal area,Buttocks,Right upper leg,Left upper leg,Face,Right lower leg,Left lower leg   Body parts bathed by helper: Right lower leg,Left lower leg     Bathing assist Assist Level: Minimal Assistance - Patient > 75%     Upper Body Dressing/Undressing Upper body dressing   What is the patient wearing?: Pull over shirt    Upper body assist Assist Level: Supervision/Verbal cueing    Lower Body Dressing/Undressing Lower body dressing      What is the patient wearing?: Underwear/pull up,Pants     Lower body assist Assist for lower body dressing: Independent     Toileting Toileting    Toileting assist Assist for toileting: Independent with assistive device     Transfers Chair/bed transfer  Transfers assist     Chair/bed transfer assist level: Contact Guard/Touching assist     Locomotion Ambulation   Ambulation assist   Ambulation activity did not occur: N/A  Assist level: Supervision/Verbal cueing Assistive device: Walker-rolling Max distance: 162ft   Walk 10 feet activity   Assist  Walk 10 feet activity did not occur: N/A  Assist level: Supervision/Verbal cueing Assistive device: Walker-rolling   Walk 50 feet activity   Assist Walk 50 feet with 2 turns activity did not occur: N/A  Assist level: Supervision/Verbal cueing Assistive  device: Walker-rolling    Walk 150 feet activity   Assist Walk 150 feet activity did not occur: N/A  Assist level: Supervision/Verbal cueing Assistive device: Walker-rolling    Walk 10 feet on uneven surface  activity   Assist Walk 10 feet on uneven surfaces activity did not occur: N/A   Assist level: Moderate Assistance - Patient - 50 - 74% Assistive  device: Aeronautical engineer Will patient use wheelchair at discharge?: No Type of Wheelchair: Manual    Wheelchair assist level: Supervision/Verbal cueing Max wheelchair distance: 176ft    Wheelchair 50 feet with 2 turns activity    Assist        Assist Level: Supervision/Verbal cueing   Wheelchair 150 feet activity     Assist      Assist Level: Supervision/Verbal cueing    Medical Problem List and Plan: 1.  B/L LE weakness, Left > Right with impaired function/ADLs/mobility s/p microdiscectomy and open decompression (L3-L5) secondary to Lumbar stenosis with myelopathy.   Continue CIR 2.  Antithrombotics: -DVT/anticoagulation:  Pharmaceutical: Lovenox  CBC within normal time 2/7             -antiplatelet therapy: N/a 3. Pain Management:              On Baclofen tid for muscle spasms.              Neurontin 400 mg qid             Oxycodone prn  Amitriptyline 10mg  HS  Controlled with meds on 2/8  Monitor with increased exertion 4. Mood: LCSW to follow for evaluation and support.              -antipsychotic agents: See #11 5. Neuropsych: This patient is capable of making decisions on her own behalf. 6. Skin/Wound Care: Routine pressure relief measures.  7. Fluids/Electrolytes/Nutrition: Monitor I/Os.              Added prosource to promote healing.  8.  Drug-induced constipation:   Continue Miralax daily  Senna S DC'd on 2/20  Bowel meds decreased on 2/3, DC'd on 2/6  Improving 9. HTN: Monitor BP tid--continue Norvasc, Cozaar, Clonidine.   Controlled on 2/8.  Orthostatics ordered, pending             Monitor increase mobility 10. T2DM with hyperglycemia: Hgb A1c-6.4 and controlled. Will monitor BS ac/hs--use SSI prn for better control.               Continue metformin bid increased on 2/8.              Resumed Lantus 5 units at bedtime  Monitor the increased mobility 11. Anxiety/depression: On Effexor bid, Lamictal bid for mood  stabilization.               She had stopped taking Abilify X 5 month-->doesn't want it.  12. H/o Gastric bypass: Had stopped taking her vitamins. Continue multivitamin.   [facility]ce[facility].    13. Leucocytosis: Resolved 14. Muscle spasms  See #3 15. Urinary retention-?  Resolved  PVRs unremarkable.  16.  Hypoalbuminemia  Supplement initiated  LOS: 7 days A FACE TO FACE EVALUATION WAS PERFORMED  Euell Schiff Lorie Phenix 03/02/2020, 8:37 AM

## 2020-03-02 NOTE — Progress Notes (Signed)
Occupational Therapy Session Note  Patient Details  Name: Carrie Cook MRN: [id number] Date of Birth: 1960-09-07  Today's Date: 03/02/2020 OT Individual Time: 0830-0930 OT Individual Time Calculation (min): 60 min    Short Term Goals: Week 1:  OT Short Term Goal 1 (Week 1): STG = LTGs due to ELOS  Skilled Therapeutic Interventions/Progress Updates:    Treatment session with focus on self-care retraining and functional mobility while adhering to back precautions. Pt received in sidelying, reporting RN concerned of breakdown on buttocks.  Discussed sleeping in sidelying with pillow between knees for appropriate alignment and encouraged pt to attempt sleeping in sidelying tonight.  Pt ambulated around room with RW to gather all items in preparation for shower.  Pt retrieved items from dresser drawers with good adherence to back precautions, discussed use of reacher to reach lower drawers and items.  Pt engaged in shower primarily seated on shower chair, standing to wash buttocks.  Therapist providing cues to be aware of areas where she is close to breaking back precautions and alternative methods to attempt.  Pt ambulated to EOB and completed dressing with use of figure 4 positioning for LB dressing even to include donning socks and shoes this session.  Pt ambulated 100' with RW with close supervision, noted intermittent scuffing of L toe during ambulation.  Returned to room and remained upright in w/c with chair alarm on and all needs in reach.  Pt reports feeling a little "woozy" and questioning if her blood sugar may be dropping.  Pt declined having RN assess but reports altered breakfast this AM as kitchen forgot her banana. Pt supplied with graham crackers and peanut butter.  Therapy Documentation Precautions:  Precautions Precautions: Back,Fall Precaution Booklet Issued: No Precaution Comments: Pt is able to verbalize back precautions but requires cues to maintain during session Required  Braces or Orthoses:  (no brace needs per order) Restrictions Weight Bearing Restrictions: No General:   Vital Signs: Therapy Vitals Temp: 98.1 F (36.7 C) Pulse Rate: 77 Resp: 16 BP: 134/83 Patient Position (if appropriate): Lying Oxygen Therapy SpO2: 98 % O2 Device: Room Air Pain: Pain Assessment Pain Scale: 0-10 Pain Score: 0-No pain Faces Pain Scale: No hurt Pain Type: Acute pain ADL: ADL Grooming: Minimal assistance Where Assessed-Grooming: Standing at sink Upper Body Bathing: Supervision/safety Where Assessed-Upper Body Bathing: Sitting at sink Lower Body Bathing: Minimal assistance Where Assessed-Lower Body Bathing: Sitting at sink,Standing at sink Upper Body Dressing: Minimal assistance Where Assessed-Upper Body Dressing: Sitting at sink Lower Body Dressing: Moderate assistance Where Assessed-Lower Body Dressing: Sitting at sink,Standing at sink Toileting: Minimal assistance Where Assessed-Toileting: Glass blower/designer: Psychiatric nurse Method: Counselling psychologist: Art gallery manager    Praxis   Exercises:   Other Treatments:     Therapy/Group: Individual Therapy  Simonne Come 03/02/2020, 9:18 AM

## 2020-03-02 NOTE — Progress Notes (Signed)
Physical Therapy Session Note  Patient Details  Name: Carrie Cook MRN: [id number] Date of Birth: 1960-08-31  Today's Date: 03/02/2020 PT Individual Time: 1347-1455 PT Individual Time Calculation (min): 68 min   Short Term Goals: Week 1:  PT Short Term Goal 1 (Week 1): STG=LTG due to LOS  Skilled Therapeutic Interventions/Progress Updates:   Received pt supine in bed, pt agreeable to therapy, and denied any pain during session. Session with emphasis on functional mobility/transfers, generalized strengthening, dynamic standing balance/coordination, ambulation, stair navigation, and improved activity tolerance. Pt performed bed mobility mod I using bedrails x 2 trials throughout session while maintaining low back precautions. Pt ambulated 61ft x 2 trials to/from bathroom with RW and supervision. Pt able to manage clothing and void with supervision. Pt stood at sink and washed hands with supervision. Pt ambulated 154ft x 2 trials with RW and close supervision to/from therapy gym. Pt with 1 instance of R toe drag causing mild LOB but pt able to self-correct. Pt navigated 12 steps with 2 rails and CGA/close supervision ascending and descending with a step through pattern. Pt performed the following activities standing on Biodex with emphasis on balance and weight shifting: -Postural stability training on level 10 starting with BUE support fading to no UE support for 1 minute and 30 seconds with CGA for balance. -Weight shift training on level static without UE support and CGA for 2 minutes -Limits of stability training on level static with no UE support fading to 1 UE for 1 minute and 11 seconds with CGA for balance. -Catch baseball game for 2 minutes on level medium without UE support and CGA Pt performed TUG with RW with close supervision/CGA with an average of 12.6 seconds. Pt demonstrated improvements in test results since eval (on 2/2 TUG score 40 seconds). Pt educated on test results,  significance, and progress shown since eval: Trial 1: 15 seconds Trial 2: 12 seconds Trial 3: 11 seconds Worked on dynamic standing balance tossing horseshoes on Airex without UE support and CGA/min A for balance x 3 trials. Pt transferred sit<>stand on Airex 2x5 reps without UE support and CGA for balance with emphasis on quad strengthening. Concluded session with pt supine in bed, needs within reach, and bed alarm on.   Therapy Documentation Precautions:  Precautions Precautions: Back,Fall Precaution Booklet Issued: No Precaution Comments: Pt is able to verbalize back precautions but requires cues to maintain during session Required Braces or Orthoses:  (no brace needs per order) Restrictions Weight Bearing Restrictions: No  Therapy/Group: Individual Therapy Alfonse Alpers PT, DPT   03/02/2020, 7:46 AM

## 2020-03-02 NOTE — Progress Notes (Signed)
Occupational Therapy Session Note  Patient Details  Name: Carrie Cook MRN: [id number] Date of Birth: 1960/06/04  Today's Date: 03/02/2020 OT Individual Time: 8264-1583 OT Individual Time Calculation (min): 60 min    Short Term Goals: Week 1:  OT Short Term Goal 1 (Week 1): STG = LTGs due to ELOS     Skilled Therapeutic Interventions/Progress Updates:    Pt received in wc and ready for therapy.  She stated she needed to work on her hip strength for better balance.  Pt used RW to ambulate to toilet with S and then completed toileting mod I.   Ambulated with RW with light CGA to gym.  In gym focused on hip strengthening exercises with caution to adhere to back precautions: -sidelying: Clamshells with knees bent, straight leg on ball rolling back and forth to activate glutes, pressing down on ball under ankle for inner thighs, small straight leg lifts -supine: Small bridges with ball squeezes between knees, hip adduction with ball squeezes  Focused on safe rolling techniques, how to position pillows when in sidelying for back support, and engagement of abdominal muscles.   Pt then ambulated back to room and sat in wc to prep for lunch.  All needs met, chair alarm on and call light in reach.   Therapy Documentation Precautions:  Precautions Precautions: Back,Fall Precaution Booklet Issued: No Precaution Comments: Pt is able to verbalize back precautions but requires cues to maintain during session Required Braces or Orthoses:  (no brace needs per order) Restrictions Weight Bearing Restrictions: No   Pain: no c/o pain     Therapy/Group: Individual Therapy  [place]ell 03/02/2020, 1:12 PM

## 2020-03-03 LAB — GLUCOSE, CAPILLARY
Glucose-Capillary: 125 mg/dL — ABNORMAL HIGH (ref 70–99)
Glucose-Capillary: 100 mg/dL — ABNORMAL HIGH (ref 70–99)
Glucose-Capillary: 109 mg/dL — ABNORMAL HIGH (ref 70–99)
Glucose-Capillary: 97 mg/dL (ref 70–99)

## 2020-03-03 LAB — BASIC METABOLIC PANEL
Anion gap: 10 (ref 5–15)
BUN: 12 mg/dL (ref 6–20)
CO2: 27 mmol/L (ref 22–32)
Calcium: 9.1 mg/dL (ref 8.9–10.3)
Chloride: 103 mmol/L (ref 98–111)
Creatinine, Ser: 0.76 mg/dL (ref 0.44–1.00)
GFR, Estimated: >60 mL/min (ref >60)
Glucose, Bld: 135 mg/dL — ABNORMAL HIGH (ref 70–99)
Potassium: 4.5 mmol/L (ref 3.5–5.1)
Sodium: 140 mmol/L (ref 135–145)

## 2020-03-03 NOTE — Progress Notes (Signed)
Patient ID: Carrie Cook, female   DOB: Jun 04, 1960, 60 y.o.   MRN: [id number]  Met with pt to discuss team conference goals of mod/i-supervision and discharge date 2/12. She feels she will be ready and can see her progress daily. Her sister will be checking on her while her husband works during the day and can transport to [employer]. Discussed equipment needs, she does want a shower chair and is aware of private pay. Will order via [employer] and make referral to [place].

## 2020-03-03 NOTE — Progress Notes (Signed)
Occupational Therapy Session Note  Patient Details  Name: Carrie Cook MRN: [id number] Date of Birth: Dec 29, 1960  Today's Date: 03/03/2020 OT Individual Time: 0938-1829 OT Individual Time Calculation (min): 43 min    Short Term Goals: Week 1:  OT Short Term Goal 1 (Week 1): STG = LTGs due to ELOS  Skilled Therapeutic Interventions/Progress Updates:    Treatment session with focus on adherence to back precautions during functional, household tasks.  Pt received in bed reporting fatigue from previous session.  Pt reports need to toilet.  Ambulated to toilet with RW with distant supervision, pt completed all toileting tasks Mod I and returned to sink to wash hands in standing.  Pt ambulated >250' with RW to ADL apt with supervision, pt reports lifting LLE slightly more to compensate for intermittent toe drag.  Engaged in functional mobility in apartment with focus on adhering to back precautions while obtaining items from dresser drawers and closet.  Therapist providing min cues for adherence to back precautions and techniques to increase positioning for improved body mechanics and adherence to back precautions.  Pt utilized reacher and placed items across RW, demonstrating good carryover of education. Discussed further modifications to increase independence with IADLs, with pt reporting agreement.  Pt ambulated back to room and remained upright in w/c with chair alarm on and all needs in reach.  Therapy Documentation Precautions:  Precautions Precautions: Back,Fall Precaution Booklet Issued: No Precaution Comments: Pt is able to verbalize back precautions but requires cues to maintain during session Required Braces or Orthoses:  (no brace needs per order) Restrictions Weight Bearing Restrictions: No Pain: Pain Assessment Pain Scale: 0-10 Pain Score: 0-No pain   Therapy/Group: Individual Therapy  Simonne Come 03/03/2020, 12:28 PM

## 2020-03-03 NOTE — Progress Notes (Signed)
[facility] PHYSICAL MEDICINE & REHABILITATION PROGRESS NOTE  Subjective/Complaints: Patient seen sitting up in her chair this AM.  She states she slept fairly overnight.  She states she is trying to sleep on her side more due to coccyx pain.   ROS: Denies CP, SOB, N/V/D  Objective: Vital Signs: Blood pressure 125/71, pulse 76, temperature 98.4 F (36.9 C), temperature source Oral, resp. rate 16, height 5\' 3"  (1.6 m), weight 83.2 kg, last menstrual period 07/10/2013, SpO2 99 %. No results found. Recent Labs    03/01/20 0629  WBC 7.8  HGB 13.9  HCT 41.4  PLT 280   Recent Labs    03/03/20 0445  NA 140  K 4.5  CL 103  CO2 27  GLUCOSE 135*  BUN 12  CREATININE 0.76  CALCIUM 9.1    Intake/Output Summary (Last 24 hours) at 03/03/2020 0952 Last data filed at 03/03/2020 0728 Gross per 24 hour  Intake 640 ml  Output --  Net 640 ml        Physical Exam: BP 125/71 (BP Location: Right Arm)   Pulse 76   Temp 98.4 F (36.9 C) (Oral)   Resp 16   Ht 5\' 3"  (1.6 m)   Wt 83.2 kg   LMP 07/10/2013   SpO2 99%   BMI 32.49 kg/m  Constitutional: No distress . Vital signs reviewed. HENT: Normocephalic.  Atraumatic. Eyes: EOMI. No discharge. Cardiovascular: No JVD.  RRR. Respiratory: Normal effort.  No stridor.  Bilateral clear to auscultation. GI: Non-distended.  BS +. Skin: Warm and dry.  Intact. Psych: Normal mood.  Normal behavior. Musc: No edema in extremities.  No tenderness in extremities. Neuro: Alert Motor: Bilateral upper extremities: 5/5 proximal and distal Right lower extremity: Hip flexion, knee extension 4+/5, ankle dorsiflexion 4+/5 Left lower extremity: Hip flexion, knee extension 4/5, ankle dorsiflexion 4/5, improving Sensation diminished to light touch left toes  Assessment/Plan: 1. Functional deficits which require 3+ hours per day of interdisciplinary therapy in a comprehensive inpatient rehab setting.  Physiatrist is providing close team supervision and  24 hour management of active medical problems listed below.  Physiatrist and rehab team continue to assess barriers to discharge/monitor patient progress toward functional and medical goals   Care Tool:  Bathing    Body parts bathed by patient: Right arm,Left arm,Chest,Abdomen,Front perineal area,Buttocks,Right upper leg,Left upper leg,Face,Right lower leg,Left lower leg   Body parts bathed by helper: Right lower leg,Left lower leg     Bathing assist Assist Level: Supervision/Verbal cueing     Upper Body Dressing/Undressing Upper body dressing   What is the patient wearing?: Pull over shirt,Bra    Upper body assist Assist Level: Supervision/Verbal cueing    Lower Body Dressing/Undressing Lower body dressing      What is the patient wearing?: Underwear/pull up,Pants     Lower body assist Assist for lower body dressing: Supervision/Verbal cueing     Toileting Toileting    Toileting assist Assist for toileting: Independent with assistive device     Transfers Chair/bed transfer  Transfers assist     Chair/bed transfer assist level: Supervision/Verbal cueing     Locomotion Ambulation   Ambulation assist   Ambulation activity did not occur: N/A  Assist level: Supervision/Verbal cueing Assistive device: Walker-rolling Max distance: 189ft   Walk 10 feet activity   Assist  Walk 10 feet activity did not occur: N/A  Assist level: Supervision/Verbal cueing Assistive device: Walker-rolling   Walk 50 feet activity   Assist Walk 50  feet with 2 turns activity did not occur: N/A  Assist level: Supervision/Verbal cueing Assistive device: Walker-rolling    Walk 150 feet activity   Assist Walk 150 feet activity did not occur: N/A  Assist level: Supervision/Verbal cueing Assistive device: Walker-rolling    Walk 10 feet on uneven surface  activity   Assist Walk 10 feet on uneven surfaces activity did not occur: N/A   Assist level: Moderate  Assistance - Patient - 50 - 74% Assistive device: Aeronautical engineer Will patient use wheelchair at discharge?: No Type of Wheelchair: Manual    Wheelchair assist level: Supervision/Verbal cueing Max wheelchair distance: 111ft    Wheelchair 50 feet with 2 turns activity    Assist        Assist Level: Supervision/Verbal cueing   Wheelchair 150 feet activity     Assist      Assist Level: Supervision/Verbal cueing    Medical Problem List and Plan: 1.  B/L LE weakness, Left > Right with impaired function/ADLs/mobility s/p microdiscectomy and open decompression (L3-L5) secondary to Lumbar stenosis with myelopathy.   Continue CIR  Team conference today to discuss current and goals and coordination of care, home and environmental barriers, and discharge planning with nursing, case manager, and therapies. Please see conference note from today as well.  2.  Antithrombotics: -DVT/anticoagulation:  Pharmaceutical: Lovenox  CBC within normal time 2/7             -antiplatelet therapy: N/a 3. Pain Management:              On Baclofen tid for muscle spasms.              Neurontin 400 mg qid             Oxycodone prn  Amitriptyline 10mg  HS  Controlled with meds on 2/9  Monitor with increased exertion 4. Mood: LCSW to follow for evaluation and support.              -antipsychotic agents: See #11 5. Neuropsych: This patient is capable of making decisions on her own behalf. 6. Skin/Wound Care: Routine pressure relief measures.  7. Fluids/Electrolytes/Nutrition: Monitor I/Os.              Added prosource to promote healing.  8.  Drug-induced constipation:   Continue Miralax daily  Senna S DC'd on 2/20  Bowel meds decreased on 2/3, DC'd on 2/6  Improved 9. HTN: Monitor BP -continue Norvasc, Cozaar, Clonidine.   Controlled on 2/9.             Monitor increase mobility 10. T2DM with hyperglycemia: Hgb A1c-6.4 and controlled. Will monitor BS ac/hs--use  SSI prn for better control.               Continue metformin bid increased on 2/8.              Resumed Lantus 5 units at bedtime  Slightly elevated on 2/9  Monitor the increased mobility 11. Anxiety/depression: On Effexor bid, Lamictal bid for mood stabilization.               She had stopped taking Abilify X 5 month-->doesn't want it.  12. H/o Gastric bypass: Had stopped taking her vitamins. Continue multivitamin.   [facility]    13. Leucocytosis: Resolved 14. Muscle spasms  See #3 15. Urinary retention-?  Resolved  PVRs unremarkable.  16.  Hypoalbuminemia  Supplement initiated  LOS: 8 days A FACE TO FACE EVALUATION WAS PERFORMED  Dannica Bickham Lorie Phenix 03/03/2020, 9:52 AM

## 2020-03-03 NOTE — Progress Notes (Signed)
Physical Therapy Session Note  Patient Details  Name: Carrie Cook MRN: [id number] Date of Birth: November 18, 1960  Today's Date: 03/03/2020 PT Individual Time: 7124-5809 and 9833-8250  PT Individual Time Calculation (min): 54 min and 56 min  Short Term Goals: Week 1:  PT Short Term Goal 1 (Week 1): STG=LTG due to LOS  Skilled Therapeutic Interventions/Progress Updates:   Treatment Session 1: 5397-6734 54 min Received pt sitting EOB ready for therapy, pt agreeable to therapy, and reported pain 4/10 in low back but declined pain interventions until end of session. Session with emphasis on functional mobility/transfers, generalized strengthening, dynamic standing balance/coordination, NMR, ambulation, and improved activity tolerance.  Pt ambulated 170ft x 2 trials with RW and close supervision to/from therapy gym. Pt demonstrated increased cadence, improved step length and foot clearance, and less reliance on using UEs with RW. Worked on dynamic standing balance on Airex tossing ball against rebounder without UE support and CGA for balance for 1 minute x 2 trials. Pt performed alternating toe taps to 3in step without UE support and min A for balance 2x20 reps with mild unsteadiness with pt stepping on top of her own feet. Pt ambulated 29ft forwards in agility ladder without UE support and min A for balance stepping to each square leading with LLE. Pt reported increased "burning" sensation in low back after activity requiring seated rest break. Pt then performed lateral side stepping inside agility ladder for additional 47ft with mod A for balance. Noted pt with Trendelenburg pattern with activity and reporting increased difficulty. Discussed OPPT vs HHPT; therapist educated pt on recommendation for OPPT to work on higher level balance and hip/glute strengthening. Pt ultimately agreeable to OPPT. Pt performed the following exercises standing without UE support and CGA for balance: -single arm bicep curls  2x10 bilaterally with 5lb dumbbell -horizontal chest press with 5lb dumbbell 2x8 -single arm overhead chest press with 3lb dumbbell x10 bilaterally Concluded session with pt sitting in WC, needs within reach, and chair pad alarm on. RN notified and administering medications at end of session.   Treatment Session 2: [id number] 56 min Received pt sitting in WC, pt agreeable to therapy, and reported "stinging" sensation along low back that increases with prolonged sitting and is relieved with standing and walking. Pt declined pain interventions but repositioning done to reduce discomfort. Session with emphasis on functional mobility/transfers, generalized strengthening, dynamic standing balance/coordination, ambulation, and improved activity tolerance. NT present to assess vitals. Pt ambulated 1100ft x 2 trials with RW and close supervision to/from therapy gym. Pt transferred sit<>supine on wedge with supervision using logroll technique. Pt performed the following exercises with supervision and verbal cues for technique: -small range bridges 2x8 with emphasis on PPT and neutral spine -hip adduction ball squeezes 10x5 second isometric hold -alternating marches x20  -sidelying clamshells 2x10 bilaterally -sidelying reverse clamshells 2x10 bilaterally Pt reported preferring to lie on her stomach but was unsure if she was allowed to. Educated pt that she can lie on her stomach as long as she maintains back precautions using the logroll technique. Pt transferred sidelying<>supine<>sidelying<> prone with supervision and cues for technique. Pt became tearful upon achieving prone position, expressing how good it felt to lie on her stomach again. Pt then performed hamstring curls 2x10 bilaterally with 2.5lb ankle weight. Pt able to tolerate moderate pressure from therapist along L3-L5 without discomfort or hypersensitivity. Pt transferred sit<>supine with supervision. Concluded session with pt supine in bed, needs  within reach, and RN present at bedside attending to  care. Therapist provided heat pack for pt's low back.   Therapy Documentation Precautions:  Precautions Precautions: Back,Fall Precaution Booklet Issued: No Precaution Comments: Pt is able to verbalize back precautions but requires cues to maintain during session Required Braces or Orthoses:  (no brace needs per order) Restrictions Weight Bearing Restrictions: No  Therapy/Group: Individual Therapy Alfonse Alpers PT, DPT   03/03/2020, 7:16 AM

## 2020-03-03 NOTE — Patient Care Conference (Signed)
Inpatient RehabilitationTeam Conference and Plan of Care Update Date: 03/03/2020   Time: 12:03 PM    Patient Name: Carrie Cook      Medical Record Number: [id number]  Date of Birth: 09/07/1960 Sex: Female         Room/Bed: 4W04C/4W04C-01 Payor Info: Payor: MEDICARE / Plan: MEDICARE PART A AND B / Product Type: *No Product type* /    Admit Date/Time:  02/24/2020  3:38 PM  Primary Diagnosis:  Myelopathy concurrent with and due to stenosis of lumbar spine [facility])  Hospital Problems: Principal Problem:   Myelopathy concurrent with and due to stenosis of lumbar spine ([place]) Active Problems:   Lumbar radiculopathy   Lumbar disc herniation with myelopathy   Hypoalbuminemia due to protein-calorie malnutrition ([place])   Muscle spasm   Controlled type 2 diabetes mellitus with hyperglycemia, without long-term current use of insulin ([place])   Essential hypertension   Drug induced constipation   Postoperative pain    Expected Discharge Date: Expected Discharge Date: 03/06/20  Team Members Present: Physician leading conference: Dr. Delice Lesch Care Coodinator Present: Dorien Chihuahua, RN, BSN, CRRN;Becky Dupree, LCSW Nurse Present: Other (comment) Jarvis Morgan, RN) PT Present: Becky Sax, PT OT Present: Simonne Come, OT PPS Coordinator present : Gunnar Fusi, SLP     Current Status/Progress Goal Weekly Team Focus  Bowel/Bladder   Pt is continent x2.  Pt will remain continent x2.  Assess q shift and prn.   Swallow/Nutrition/ Hydration             ADL's   Supervision transfers with RW, Min A LB bathing/dressing, Supervision UB bathing/dressing  Mod I overall, Supervision shower transfers  ADL retraining, functional transfers, dynamic standing balance, endurance, LB dressing, adherence to back precautions during ADLs and IADLs   Mobility   bed mobility supervision, transfers with RW CGA/supervision, gait >177ft with RW CGA/supervision  Mod I transfers and short distance gait, CGA steps   functional mobility/transfers, generalized strengthening, dynamic standing balance/coordination, ambulation, and improved activity tolerance.   Communication             Safety/Cognition/ Behavioral Observations            Pain   Pt does not report pain at this time.  Pain will remain <3.  Assess q shift and prn.   Skin   Pt has spine incision from surgery.  Wound will coninue to heal and remain infection free.  Assess q shift and prn.     Discharge Planning:  Home with husband who works during the day and pt will be alone during this time. Will need to be mod/i either gait or wheelchair level to be safe at home alone   Team Discussion: Doing well.  Patient on target to meet rehab goals: yes, currently supervision for transfers and ambulation with loss of balance posteriorly. Supervision overall for self care and Mod I for toileting and dressing. Mod I goals set for discharge  *See Care Plan and progress notes for long and short-term goals.   Revisions to Treatment Plan:   Teaching Needs: Safety, medications, etc.  Current Barriers to Discharge: None  Possible Resolutions to Barriers: Recommend DME and OP PT follow up     Medical Summary Current Status: B/L LE weakness, Left > Right with impaired function/ADLs/mobility s/p microdiscectomy and open decompression (L3-L5) secondary to Lumbar stenosis with myelopathy.  Barriers to Discharge: Medical stability;Decreased family/caregiver support   Possible Resolutions to [employer] Focus: Therapies, optimize BP/DM meds/bowel meds/pain meds, patient/family  edu   Continued Need for Acute Rehabilitation Level of Care: The patient requires daily medical management by a physician with specialized training in physical medicine and rehabilitation for the following reasons: Direction of a multidisciplinary physical rehabilitation program to maximize functional independence : Yes Medical management of patient stability for increased  activity during participation in an intensive rehabilitation regime.: Yes Analysis of laboratory values and/or radiology reports with any subsequent need for medication adjustment and/or medical intervention. : Yes   I attest that I was present, lead the team conference, and concur with the assessment and plan of the team.   Dorien Chihuahua B 03/03/2020, 2:32 PM

## 2020-03-03 NOTE — Progress Notes (Signed)
Physical Therapy Session Note  Patient Details  Name: Carrie Cook MRN: [id number] Date of Birth: [birth year]  Today's Date: 03/03/2020 PT Individual Time: 1300-1335 PT Individual Time Calculation (min): 35 min   Short Term Goals: Week 2:  PT Short Term Goal 1 (Week 1): STG=LTG due to LOS  Skilled Therapeutic Interventions/Progress Updates:    Patient in w/c in room.  Discussed dizziness and pt reported none current, but had some initially when admitted to unit feels due to limited mobility and time spent in bed.  Sit to stand to RW with S.  Patient ambulated with CGA to S to dayroom about 130'.  Patient on mat for vestibular assessment as below.  Seated edge of chair for seated gaze stability x 60 sec with target held in hand at arms length head rotation, then nods.  Did feel some dizziness after performing but <6/10.  Discussed performing for HEP with target fixed to wall.  Issued handouts.  Patient ambulated to room with RW and close S.  Left up in w/c with alarm active and needs in reach.   Vestibular Assessment - 03/03/20 0001      Symptom Behavior   Subjective history of current problem Reports history of migraines and always feels she has been motion sensitive.  Some dizziness/nausea when initially admitted to unit, but improved now.    Type of Dizziness  Unsteady with head/body turns;Lightheadedness;Imbalance    Frequency of Dizziness intermittent    Duration of Dizziness seconds to minutes    Symptom Nature Intermittent;Variable;Motion provoked    Aggravating Factors Turning head quickly;Sitting in moving car    Relieving Factors Rest;Slow movements    Progression of Symptoms Better    History of similar episodes some with migraine      Oculomotor Exam   Oculomotor Alignment Normal    Ocular ROM WNL    Spontaneous Absent    Gaze-induced  Absent    Head shaking Horizontal Absent    Smooth Pursuits Intact    Saccades Intact      Oculomotor Exam-Fixation Suppressed     Left Head Impulse positive for sluggish refixation    Right Head Impulse negative      Vestibulo-Ocular Reflex   VOR 1 Head Only (x 1 viewing) performed x 1 minute head rotation and nodding with no c/o dizziness, just eye fatigue    VOR Cancellation Normal      Auditory   Comments intact to scratch test, duller on R feels due to ear wax build up             Therapy Documentation Precautions:  Precautions Precautions: Back,Fall Precaution Booklet Issued: No Precaution Comments: Pt is able to verbalize back precautions but requires cues to maintain during session Required Braces or Orthoses:  (no brace needs per order) Restrictions Weight Bearing Restrictions: No Pain: Pain Assessment Pain Scale: 0-10 Pain Score: 0-No pain    Therapy/Group: Individual Therapy  Reginia Naas  [name], PT 03/03/2020, 5:16 PM

## 2020-03-04 ENCOUNTER — Inpatient Hospital Stay (HOSPITAL_COMMUNITY): Payer: Medicare Other

## 2020-03-04 DIAGNOSIS — R079 Chest pain, unspecified: Secondary | ICD-10-CM

## 2020-03-04 DIAGNOSIS — R0789 Other chest pain: Secondary | ICD-10-CM

## 2020-03-04 LAB — GLUCOSE, CAPILLARY
Glucose-Capillary: 138 mg/dL — ABNORMAL HIGH (ref 70–99)
Glucose-Capillary: 117 mg/dL — ABNORMAL HIGH (ref 70–99)
Glucose-Capillary: 168 mg/dL — ABNORMAL HIGH (ref 70–99)
Glucose-Capillary: 101 mg/dL — ABNORMAL HIGH (ref 70–99)

## 2020-03-04 LAB — TROPONIN I (HIGH SENSITIVITY): Troponin I (High Sensitivity): 2 ng/L (ref <18)

## 2020-03-04 NOTE — Progress Notes (Signed)
Physical Therapy Session Note  Patient Details  Name: Carrie Cook MRN: [id number] Date of Birth: March 13, 1960  Today's Date: 03/04/2020 PT Individual Time: 1015-1040 and 1445-1524 PT Individual Time Calculation (min): 25 min and 39 min  Short Term Goals: Week 1:  PT Short Term Goal 1 (Week 1): STG=LTG due to LOS  Skilled Therapeutic Interventions/Progress Updates:   Treatment Session 1: 1015-1040 25 min Received pt semi-reclined in bed and reported sleeping on her L side last night and waking up this morning with chest congestion, a sore throat, and numbness and tingling along her L UE into her last 2 digits (potentially from sleeping wrong?) Pt reported having chest x-ray and blood drawn this morning but still waiting on cardiac EKG. Per MD, limit strenuous activity until results come in. Pt also reported new onset on tingling along L posterior neck and L scapula as well as abdominal cramping and felt urge to use restroom. Pt performed bed mobility mod I using bedrails and donned shoes sitting EOB with supervision. Pt ambulated 44ft to bathroom with RW and supervision. Pt able to manage clothing and required increased time on toilet. Pt reported her stool was abnormal odor and consistency. Demonstrated ulnar nerve glides and upper trapezius stretch to pt to practice in between therapies. Concluded session with pt sitting on commode with all needs within reach. NT aware of pt's current status and symptoms.   Treatment Session 2: 1445-1524 39 min Received pt supine in bed, pt agreeable to therapy, and denied any pain during session but reported R glute soreness. Session with emphasis on functional mobility/transfers, generalized strengthening, dynamic standing balance/coordination, ambulation, simulated car transfers, and improved activity tolerance. Pt performed bed mobility mod I with increased time x 2 trials throughout session. Pt ambulated >345ft with RW and supervision to ortho gym and  ambulated 30ft on uneven surfaces (ramp and mulch) and over 1 6in curb with RW and supervision. Pt then performed simulated car transfer with RW and supervision. Pt ambulated additional 345ft with RW and supervision to dayroom and performed BLE strengthening on Nustep at workload 3 for 10 minutes for a total of 399 steps for improved cardiovascular endurance. Worked on dynamic standing balance tapping each LE to 3 cones x 5 reps bilaterally without UE support and min A for balance. Pt ambulated additional 174ft with RW and supervision back to room. Concluded session with pt supine in bed with all needs within reach.   Therapy Documentation Precautions:  Precautions Precautions: Back,Fall Precaution Booklet Issued: No Precaution Comments: Pt is able to verbalize back precautions but requires cues to maintain during session Required Braces or Orthoses:  (no brace needs per order) Restrictions Weight Bearing Restrictions: No  Therapy/Group: Individual Therapy Alfonse Alpers PT, DPT   03/04/2020, 7:18 AM

## 2020-03-04 NOTE — Progress Notes (Signed)
Occupational Therapy Session Note  Patient Details  Name: Carrie Cook MRN: [id number] Date of Birth: April 27, 1960  Today's Date: 03/04/2020 OT Individual Time: 1258-1400 OT Individual Time Calculation (min): 62 min    Short Term Goals: Week 1:  OT Short Term Goal 1 (Week 1): STG = LTGs due to ELOS  Skilled Therapeutic Interventions/Progress Updates:    Patient in bed, alert and ready for therapy session.  She notes not feeling well this am and able to report work up completed throughout this morning.  She states that she continues with mild low back pain at surgical site, but otherwise able to eat lunch and feeling better.  As per nursing all clear for therapy.  Supine to sitting edge of bed with good back safety CS.  Able to donn shoes mod I.  Sit to stand and SPT with RW to/from bed, w/c with CGA, no LOB or complaints.  She denies need for adl tasks at this time.   Completed UB ergometer without resistance x5 minutes, x2 minutes, x2 minutes - fatigue noted Completed seated and standing mobility/balance, core strengthening, scapular ROM and light stretch with focus on activities that maintain back precautions - she demonstrates good understanding.  Provided back support that was effective in relieving pain and strain.  Completed ambulation on unit 2 x 300-500 feet CGA with RW - fatigues at end of walks.  She returned to bed at close of session, bed alarm set and call bell in hand.    Therapy Documentation Precautions:  Precautions Precautions: Back,Fall Precaution Booklet Issued: No Precaution Comments: Pt is able to verbalize back precautions but requires cues to maintain during session Required Braces or Orthoses:  (no brace needs per order) Restrictions Weight Bearing Restrictions: No   Therapy/Group: Individual Therapy  Carlos Levering 03/04/2020, 7:46 AM

## 2020-03-04 NOTE — Progress Notes (Signed)
[place] PHYSICAL MEDICINE & REHABILITATION PROGRESS NOTE  Subjective/Complaints: Patient seen sitting up at the edge of her bed this morning working with therapies.  Good sitting balance noted.  She states she slept well overnight, woke up with numbness in her fourth and fifth digit in her left upper extremity along with chest pain, and congestion.  She denies any contacts from outside the hospital.  ROS: + Chest pressure.  Denies SOB, N/V/D, jaw pain.  Objective: Vital Signs: Blood pressure 119/81, pulse 82, temperature 98.5 F (36.9 C), temperature source Oral, resp. rate 16, height 5\' 3"  (1.6 m), weight 83.2 kg, last menstrual period 07/10/2013, SpO2 95 %. DG Chest 2 View  Result Date: 03/04/2020 CLINICAL DATA:  60 year old female who woke with chest tightness and finger numbness this morning. EXAM: CHEST - 2 VIEW COMPARISON:  Chest radiographs 07/31/2012 and earlier. FINDINGS: Lung volumes and mediastinal contours remain normal. Both lungs appear clear. No pneumothorax or pleural effusion. Visualized tracheal air column is within normal limits. No acute osseous abnormality identified. Increased lower thoracic endplate spurring since 2014. Negative visible bowel gas. IMPRESSION: Negative.  No acute cardiopulmonary abnormality. Electronically Signed   By: Genevie Ann M.D.   On: 03/04/2020 09:12   No results for input(s): WBC, HGB, HCT, PLT in the last 72 hours. Recent Labs    03/03/20 0445  NA 140  K 4.5  CL 103  CO2 27  GLUCOSE 135*  BUN 12  CREATININE 0.76  CALCIUM 9.1    Intake/Output Summary (Last 24 hours) at 03/04/2020 1230 Last data filed at 03/04/2020 0700 Gross per 24 hour  Intake 617 ml  Output --  Net 617 ml        Physical Exam: BP 119/81 (BP Location: Left Arm)   Pulse 82   Temp 98.5 F (36.9 C) (Oral)   Resp 16   Ht 5\' 3"  (1.6 m)   Wt 83.2 kg   LMP 07/10/2013   SpO2 95%   BMI 32.49 kg/m  Constitutional: No distress . Vital signs reviewed. HENT:  Normocephalic.  Atraumatic. Eyes: EOMI. No discharge. Cardiovascular: No JVD.  RRR. Respiratory: Normal effort.  No stridor.  Bilateral clear to auscultation. GI: Non-distended.  BS +. Skin: Warm and dry.  Intact. Psych: Normal mood.  Normal behavior. Musc: No edema in extremities.  No tenderness in extremities. Right chest wall TTP Neuro: Alert Motor: Bilateral upper extremities: 5/5 proximal and distal Right lower extremity: Hip flexion, knee extension 4+/5, ankle dorsiflexion 4+/5, unchanged Left lower extremity: Hip flexion, knee extension 4/5, ankle dorsiflexion 4/5, unchanged Sensation diminished to light touch left toes  Assessment/Plan: 1. Functional deficits which require 3+ hours per day of interdisciplinary therapy in a comprehensive inpatient rehab setting.  Physiatrist is providing close team supervision and 24 hour management of active medical problems listed below.  Physiatrist and rehab team continue to assess barriers to discharge/monitor patient progress toward functional and medical goals   Care Tool:  Bathing    Body parts bathed by patient: Right arm,Left arm,Chest,Abdomen,Front perineal area,Buttocks,Right upper leg,Left upper leg,Face,Right lower leg,Left lower leg   Body parts bathed by helper: Right lower leg,Left lower leg     Bathing assist Assist Level: Supervision/Verbal cueing     Upper Body Dressing/Undressing Upper body dressing   What is the patient wearing?: Pull over shirt,Bra    Upper body assist Assist Level: Supervision/Verbal cueing    Lower Body Dressing/Undressing Lower body dressing      What is the  patient wearing?: Underwear/pull up,Pants     Lower body assist Assist for lower body dressing: Supervision/Verbal cueing     Toileting Toileting    Toileting assist Assist for toileting: Independent with assistive device     Transfers Chair/bed transfer  Transfers assist     Chair/bed transfer assist level:  Supervision/Verbal cueing     Locomotion Ambulation   Ambulation assist   Ambulation activity did not occur: N/A  Assist level: Supervision/Verbal cueing Assistive device: Walker-rolling Max distance: 119ft   Walk 10 feet activity   Assist  Walk 10 feet activity did not occur: N/A  Assist level: Supervision/Verbal cueing Assistive device: Walker-rolling   Walk 50 feet activity   Assist Walk 50 feet with 2 turns activity did not occur: N/A  Assist level: Supervision/Verbal cueing Assistive device: Walker-rolling    Walk 150 feet activity   Assist Walk 150 feet activity did not occur: N/A  Assist level: Supervision/Verbal cueing Assistive device: Walker-rolling    Walk 10 feet on uneven surface  activity   Assist Walk 10 feet on uneven surfaces activity did not occur: N/A   Assist level: Moderate Assistance - Patient - 50 - 74% Assistive device: Aeronautical engineer Will patient use wheelchair at discharge?: No Type of Wheelchair: Manual    Wheelchair assist level: Supervision/Verbal cueing Max wheelchair distance: 150ft    Wheelchair 50 feet with 2 turns activity    Assist        Assist Level: Supervision/Verbal cueing   Wheelchair 150 feet activity     Assist      Assist Level: Supervision/Verbal cueing    Medical Problem List and Plan: 1.  B/L LE weakness, Left > Right with impaired function/ADLs/mobility s/p microdiscectomy and open decompression (L3-L5) secondary to Lumbar stenosis with myelopathy.   Therapies on hold until results of cardiac workup-patient education 2.  Antithrombotics: -DVT/anticoagulation:  Pharmaceutical: Lovenox  CBC within normal time 2/7             -antiplatelet therapy: N/a 3. Pain Management:              On Baclofen tid for muscle spasms.              Neurontin 400 mg qid             Oxycodone prn  Amitriptyline 10mg  HS  Controlled with meds on 2/9  Monitor with  increased exertion 4. Mood: LCSW to follow for evaluation and support.              -antipsychotic agents: See #11 5. Neuropsych: This patient is capable of making decisions on her own behalf. 6. Skin/Wound Care: Routine pressure relief measures.  7. Fluids/Electrolytes/Nutrition: Monitor I/Os.              Added prosource to promote healing.  8.  Drug-induced constipation:   Continue Miralax daily  Senna S DC'd on 2/20  Bowel meds decreased on 2/3, DC'd on 2/6  Improved 9. HTN: Monitor BP -continue Norvasc, Cozaar, Clonidine.   Controlled on 2/10             Monitor increase mobility 10. T2DM with hyperglycemia: Hgb A1c-6.4 and controlled. Will monitor BS ac/hs--use SSI prn for better control.               Continue metformin bid increased on 2/8.              Resumed Lantus 5 units at  bedtime  Slightly labile on 2/10  Monitor the increased mobility 11. Anxiety/depression: On Effexor bid, Lamictal bid for mood stabilization.               She had stopped taking Abilify X 5 month-->doesn't want it.  12. H/o Gastric bypass: Had stopped taking her vitamins. Continue multivitamin.   [facility]    13. Leucocytosis: Resolved 14. Muscle spasms  See #3 15. Urinary retention-?  Resolved  PVRs unremarkable.  16.  Hypoalbuminemia  Supplement initiated 17.  Chest pain  Stat EKG ordered-personally reviewed -normal sinus rhythm, await official read  Troponin ordered  ? Due to anxiety  LOS: 9 days A FACE TO FACE EVALUATION WAS PERFORMED  Carrie Cook Lorie Phenix 03/04/2020, 12:30 PM

## 2020-03-04 NOTE — Progress Notes (Signed)
Physical Therapy Session Note  Patient Details  Name: Carrie Cook MRN: [id number] Date of Birth: 28-Apr-1960  Today's Date: 03/04/2020 PT Individual Time: 0800-0845 PT Individual Time Calculation (min): 45 min   Short Term Goals: Week 1:  PT Short Term Goal 1 (Week 1): STG=LTG due to LOS  Skilled Therapeutic Interventions/Progress Updates:     Patient ambulating in the room with NT in the room upon PT arrival. Patient alert and agreeable to PT session. Patient denied pain during session. Reports new congestion, abdominal upset, and numbness of L digits 4-5 and medial hand. RN made aware, limited to low level activity in the room.   Therapeutic Activity: Transfers: Patient performed sit to/from stand with supervision throughout session. Provided verbal cues for maintaining back precautions with transfers, flat back, symmetrical shoulders when pushing up. Patient performed dressing with supervision, obtained clothing and doffed/donned shirt, bra, pants, underwear, socks and shoes with supervision-mod I using a reacher to maintain back precautions. Need cues to don underwear in sitting for safety and for preventing twisting when donning bra and shirt.   Neuromuscular Re-ed: Patient performed the following activities: -SLS focused on lateral hip stability and quad activation progressing from B upper extremity support to no upper extremity support 4x3-6 sec  MD rounded and made aware of patient's symptoms. MD ordered testing and imaging and instructed that patient rest until results come back.  Focused remainder of session on fall risk and home safety education. Also performed trigger point release to R erector spinae at T11/12 level for moderate sensory pain with jerk response over tirgger point.   Patient in bed with x-ray tech at end of session with breaks locked, bed alarm set, and all needs within reach. Patient missed 15 min of skilled PT due to x-ray, RN made aware. Will attempt to  make-up missed time as able.     Therapy Documentation Precautions:  Precautions Precautions: Back,Fall Precaution Booklet Issued: No Precaution Comments: Pt is able to verbalize back precautions but requires cues to maintain during session Required Braces or Orthoses:  (no brace needs per order) Restrictions Weight Bearing Restrictions: No General: PT Amount of Missed Time (min): 15 Minutes PT Missed Treatment Reason: Xray   Therapy/Group: Individual Therapy  Angelito Hopping L Deetta Siegmann PT, DPT  03/04/2020, 6:15 PM

## 2020-03-04 NOTE — Progress Notes (Signed)
Inpatient Rehabilitation Care Coordinator Discharge Note  The overall goal for the admission was met for: DC SAT 2/12  Discharge location: Yes-HOME WITH HUSBAND WHO WORKS DURING THE DAY, SISTER TO CHECK ON HR AND TRANSPORT TO OPPT APPOINTMENTS  Length of Stay: Yes-11 DAYS  Discharge activity level: Yes-MOD/I LEVEL-SOME SUPERVISION LEVEL  Home/community participation: Yes  Services provided included: MD, RD, PT, OT, RN, CM, TR, Pharmacy, Neuropsych and SW  Financial Services: Medicare  Choices offered to/list presented to:YES  Follow-up services arranged: Outpatient: [facility]-PT WILL CALL TO SCHEDULE APPOINTMENT, DME: [facility] and Patient/Family has no preference for HH/DME agencies  Comments (or additional information):PT DID WELL AND REACHED MOD/I Cashion ON HER WHILE HUSBAND IS AT WORK  Patient/Family verbalized understanding of follow-up arrangements: Yes  Individual responsible for coordination of the follow-up plan: SELF [phone]  Confirmed correct DME delivered: Elease Hashimoto 03/04/2020    Emilee Market, [place]

## 2020-03-04 NOTE — Plan of Care (Signed)
  Problem: Consults Goal: RH SPINAL CORD INJURY PATIENT EDUCATION Description:  See Patient Education module for education specifics.  Outcome: Progressing Goal: Skin Care Protocol Initiated - if Braden Score 18 or less Description: If consults are not indicated, leave blank or document N/A Outcome: Progressing Goal: Nutrition Consult-if indicated Outcome: Progressing Goal: Diabetes Guidelines if Diabetic/Glucose > 140 Description: If diabetic or lab glucose is > 140 mg/dl - Initiate Diabetes/Hyperglycemia Guidelines & Document Interventions  Outcome: Progressing   Problem: SCI BOWEL ELIMINATION Goal: RH STG MANAGE BOWEL WITH ASSISTANCE Description: STG Manage Bowel with mod I Assistance. Outcome: Progressing   Problem: SCI BLADDER ELIMINATION Goal: RH STG MANAGE BLADDER WITH ASSISTANCE Description: STG Manage Bladder With mod I Assistance Outcome: Progressing   Problem: RH SKIN INTEGRITY Goal: RH STG SKIN FREE OF INFECTION/BREAKDOWN Outcome: Progressing Goal: RH STG MAINTAIN SKIN INTEGRITY WITH ASSISTANCE Description: STG Maintain Skin Integrity With mod I Assistance. Outcome: Progressing Goal: RH STG ABLE TO PERFORM INCISION/WOUND CARE W/ASSISTANCE Description: STG Able To Perform Incision/Wound Care With Assistance. Outcome: Progressing   Problem: RH SAFETY Goal: RH STG ADHERE TO SAFETY PRECAUTIONS W/ASSISTANCE/DEVICE Description: STG Adhere to Safety Precautions With Assistance/Device. Outcome: Progressing   Problem: RH PAIN MANAGEMENT Goal: RH STG PAIN MANAGED AT OR BELOW PT'S PAIN GOAL Description: Pain scale <4/10 Outcome: Progressing   Problem: RH KNOWLEDGE DEFICIT SCI Goal: RH STG INCREASE KNOWLEDGE OF SELF CARE AFTER SCI Outcome: Progressing

## 2020-03-05 DIAGNOSIS — R7309 Other abnormal glucose: Secondary | ICD-10-CM

## 2020-03-05 LAB — GLUCOSE, CAPILLARY
Glucose-Capillary: 147 mg/dL — ABNORMAL HIGH (ref 70–99)
Glucose-Capillary: 101 mg/dL — ABNORMAL HIGH (ref 70–99)
Glucose-Capillary: 129 mg/dL — ABNORMAL HIGH (ref 70–99)
Glucose-Capillary: 95 mg/dL (ref 70–99)

## 2020-03-05 MED ORDER — CLONIDINE HCL 0.1 MG PO TABS: 0.1000 mg | ORAL_TABLET | Freq: Every day | ORAL | 0 refills | Status: DC

## 2020-03-05 MED ORDER — LANTUS SOLOSTAR 100 UNIT/ML ~~LOC~~ SOPN: PEN_INJECTOR | SUBCUTANEOUS | 5 refills | Status: DC

## 2020-03-05 MED ORDER — GABAPENTIN 400 MG PO CAPS: 400.0000 mg | ORAL_CAPSULE | Freq: Four times a day (QID) | ORAL | 0 refills | Status: DC

## 2020-03-05 MED ORDER — AMLODIPINE BESYLATE 2.5 MG PO TABS: 2.5000 mg | ORAL_TABLET | Freq: Every day | ORAL | 0 refills | Status: DC

## 2020-03-05 MED ORDER — LIDOCAINE 5 % EX PTCH: 1.0000 | MEDICATED_PATCH | CUTANEOUS | 0 refills | Status: DC

## 2020-03-05 MED ORDER — ADULT MULTIVITAMIN W/MINERALS CH: 1.0000 | ORAL_TABLET | Freq: Every day | ORAL | Status: AC

## 2020-03-05 MED ORDER — AMITRIPTYLINE HCL 10 MG PO TABS: 10.0000 mg | ORAL_TABLET | Freq: Every day | ORAL | 0 refills | Status: DC

## 2020-03-05 MED ORDER — METFORMIN HCL 850 MG PO TABS: 850.0000 mg | ORAL_TABLET | Freq: Two times a day (BID) | ORAL | 0 refills | Status: DC

## 2020-03-05 NOTE — Progress Notes (Signed)
Physical Therapy Session Note  Patient Details  Name: Carrie Cook MRN: [id number] Date of Birth: 1960/12/06  Today's Date: 03/05/2020 PT Individual Time: 1050-1115 PT Individual Time Calculation (min): 25 min   Short Term Goals: Week 1:  PT Short Term Goal 1 (Week 1): STG=LTG due to LOS  Skilled Therapeutic Interventions/Progress Updates:   Pt received sitting in WC and agreeable to PT. Gait training through hall 2 x 144f without cues or assist from PT. Dynamic balance training whlie engaged in gross motor task of wii fit and standing on airec pad. Supervision assist throughout to maintain balance x 134fmes with no UE. Mild posterior bias, but able to correct with only cues.  Forced use and activity tolerance training with BUE while engaged in [place] x 3 rounds. Cues for improved use of BUE over just use of dominant hand. Sit<>stand without cues or assist throughout session x 6.  Patient returned to room and performed stand pivot to recliner with RW, but no assist needed from PT. Pt left sitting in recliner with call bell in reach and all needs met.         Therapy Documentation Precautions:  Precautions Precautions: Back,Fall Precaution Booklet Issued: No Precaution Comments: Pt is able to verbalize back precautions but requires cues to maintain during session Required Braces or Orthoses:  (no brace needs per order) Restrictions Weight Bearing Restrictions: No    Pain: Pain Assessment Pain Scale: 0-10 Pain Score: 0-No pain   Therapy/Group: Individual Therapy  AuLorie Phenix/11/2020, 11:26 AM

## 2020-03-05 NOTE — Progress Notes (Signed)
Physical Therapy Session Note  Patient Details  Name: Carrie Cook MRN: [id number] Date of Birth: [birth year]  Today's Date: 03/05/2020 PT Individual Time: 8185-6314 and 1345-1409 PT Individual Time Calculation (min): 54 min and 24 min  Short Term Goals: Week 1:  PT Short Term Goal 1 (Week 1): STG=LTG due to LOS  Skilled Therapeutic Interventions/Progress Updates:   Treatment Session 1: 9702-6378 54 min Received pt supine in bed, pt agreeable to therapy, and reported soreness in low back/R glute but denied any pain. Session with emphasis on discharge planning, functional mobility/transfers, generalized strengthening, dynamic standing balance/coordination, NMR, gait training, stair navigation, and improved activity tolerance. Pt performed bed mobility mod I and donned shoes sitting EOB independently while maintaining back precautions. Pt ambulated 9ft with RW mod I into bathroom and able to manage clothing and void mod I. Pt stood at sink and washed hands and brushed teeth mod I. MD present for morning rounds. Pt ambulated 122ft x 2 trials mod I with RW to/from therapy gym. Pt navigated 12 steps with 2 rails and supervision ascending and descending with a step to pattern. Worked on dynamic standing balance performing perturbations standing on Airex without UE support and no LOB x 2 trials. Pt transferred sit<>stand on Airex without UE support 2 x 15 reps and close supervision for balance with emphasis on balance and quad strengthening. Worked on single leg balance with mod A for balance x 3 trials for ~15-30 seconds on each LE. Pt with increased difficulty maintaining balance on LLE>RLE. Pt ambuated 160ft with RW and supervision incorporating L/R/up/down head turns, 360 degree turns, and sudden stops with no LOB. Concluded session with pt sitting EOB with all needs within reach. Pt made Mod I in room.   Treatment Session 2: 1345-1409 24 min Received pt sitting in chair in room, pt agreeable to  therapy, and did not c/o pain during session but continues to report "soreness" in low back/RLE. Session with emphasis on functional mobility/transfers, generalized strengthening, dynamic standing balance/coordination, ambulation, and improved activity tolerance. Pt ambulated 17ft x 2 trials mod I with RW to/from dayroom. Worked on dynamic standing balance selecting Valentine's Day card options. Pt sat at table in dayroom and worked on sitting balance/posture and fine motor control creating Valentines Day card for husband independently. Pt ambulated >52ft around unit with RW with supervision/mod I with no LOB noted but "legs felt like jelly" afterwards. Concluded session with pt sitting in chair with needs within reach. Pt with no further questions regarding mobility and prepared to discharge home tomorrow.   Therapy Documentation Precautions:  Precautions Precautions: Back,Fall Precaution Booklet Issued: No Precaution Comments: Pt is able to verbalize back precautions but requires cues to maintain during session Required Braces or Orthoses:  (no brace needs per order) Restrictions Weight Bearing Restrictions: No  Therapy/Group: Individual Therapy Alfonse Alpers PT, DPT   03/05/2020, 7:24 AM

## 2020-03-05 NOTE — Progress Notes (Signed)
Physical Therapy Discharge Summary  Patient Details  Name: Carrie Cook MRN: [id number] Date of Birth: 11-17-1960  Patient has met 8 of 8 long term goals due to improved activity tolerance, improved balance, improved postural control, increased strength, decreased pain, improved awareness and improved coordination.  Patient to discharge at an ambulatory level Modified Independent. Pt's husband did not attend family education training, however pt currently at a Mod I level overall and demonstrates great safety awareness and adherence to back precautions.   All goals met  Recommendation:  Patient will benefit from ongoing skilled PT services in outpatient setting to continue to advance safe functional mobility, address ongoing impairments in generalized strengthening, dynamic standing balance/coordination, ambulation, endurance, and to minimize fall risk.  Equipment: No equipment provided  Reasons for discharge: treatment goals met  Patient/family agrees with progress made and goals achieved: Yes  PT Discharge Precautions/Restrictions Precautions Precautions: Back;Fall Restrictions Weight Bearing Restrictions: No Cognition Overall Cognitive Status: Within Functional Limits for tasks assessed Arousal/Alertness: Awake/alert Orientation Level: Oriented X4 Memory: Appears intact Awareness: Appears intact Problem Solving: Appears intact Safety/Judgment: Appears intact Sensation Sensation Light Touch: Impaired by gross assessment Proprioception: Appears Intact Additional Comments: decreased sensation along RLE Coordination Gross Motor Movements are Fluid and Coordinated: No Fine Motor Movements are Fluid and Coordinated: Yes Coordination and Movement Description: mild uncoordination due to weakness and fatigue Finger Nose Finger Test: [facility] bilaterally Heel Shin Test: [facility] bilaterally Motor  Motor Motor: Within Functional Limits Motor - Skilled Clinical Observations: fatigue  and generalized weakness  Mobility Bed Mobility Bed Mobility: Rolling Right;Rolling Left;Sit to Supine;Supine to Sit Rolling Right: Independent with assistive device Rolling Left: Independent with assistive device Supine to Sit: Independent with assistive device Sit to Supine: Independent with assistive device Transfers Transfers: Sit to Stand;Stand to Sit;Stand Pivot Transfers Sit to Stand: Independent with assistive device Stand to Sit: Independent with assistive device Stand Pivot Transfers: Independent with assistive device Transfer (Assistive device): Rolling walker Locomotion  Gait Ambulation: Yes Gait Assistance: Independent with assistive device Gait Distance (Feet): 150 Feet Assistive device: Rolling walker Gait Gait: Yes Gait Pattern: Impaired Gait Pattern: Step-through pattern;Decreased step length - left;Trendelenburg;Poor foot clearance - right;Poor foot clearance - left;Decreased trunk rotation;Left flexed knee in stance;Right flexed knee in stance Gait velocity: slightly decreased Stairs / Additional Locomotion Stairs: Yes Stairs Assistance: Supervision/Verbal cueing Stair Management Technique: Two rails Number of Stairs: 12 Height of Stairs: 6 Ramp: Supervision/Verbal cueing (RW) Curb: Supervision/Verbal cueing (RW) Wheelchair Mobility Wheelchair Mobility: No  Trunk/Postural Assessment  Cervical Assessment Cervical Assessment: Within Functional Limits Thoracic Assessment Thoracic Assessment: Within Functional Limits Lumbar Assessment Lumbar Assessment: Exceptions to [facility] (posterior pelvic tilt) Postural Control Postural Control: Deficits on evaluation  Balance Balance Balance Assessed: Yes Standardized Balance Assessment Standardized Balance Assessment: Timed Up and Go Test Timed Up and Go Test TUG: Normal TUG Normal TUG (seconds): 12.6 Static Sitting Balance Static Sitting - Balance Support: Feet supported;No upper extremity supported Static  Sitting - Level of Assistance: 7: Independent Dynamic Sitting Balance Dynamic Sitting - Balance Support: Feet supported;No upper extremity supported Dynamic Sitting - Level of Assistance: 7: Independent Static Standing Balance Static Standing - Balance Support: Bilateral upper extremity supported (RW) Static Standing - Level of Assistance: 6: Modified independent (Device/Increase time) Dynamic Standing Balance Dynamic Standing - Balance Support: Bilateral upper extremity supported (RW) Dynamic Standing - Level of Assistance: 6: Modified independent (Device/Increase time) Extremity Assessment  RLE Assessment RLE Assessment: Exceptions to [facility] General Strength Comments: grossly generalized to 4/5 (  except DF 4-/5) LLE Assessment LLE Assessment: Exceptions to [facility] General Strength Comments: grossly generalized to 4/5 (except DF 3+/5  Alfonse Alpers PT, DPT  03/05/2020, 7:43 AM

## 2020-03-05 NOTE — Progress Notes (Signed)
Occupational Therapy Discharge Summary  Patient Details  Name: Carrie Cook MRN: [id number] Date of Birth: 1960/02/10   Patient has met 10 of 10 long term goals due to improved activity tolerance, improved balance, postural control, ability to compensate for deficits and improved awareness.  Patient to discharge at overall Modified Independent level, supervision shower transfers.  Patient's care partner is independent to provide the necessary intermittent assistance at discharge.  Pt's husband did not attend any formal hands on family education as pt will be Mod I overall and is able to direct her care as needed.  Reasons goals not met: N/A  Recommendation:  Patient will not require OT follow up at this time.  Equipment: shower chair  Reasons for discharge: treatment goals met and discharge from hospital  Patient/family agrees with progress made and goals achieved: Yes  OT Discharge Precautions/Restrictions  Precautions Precautions: Back;Fall Restrictions Weight Bearing Restrictions: No Pain Pain Assessment Pain Scale: 0-10 Pain Score: 0-No pain ADL ADL Equipment Provided: Reacher Eating: Independent Grooming: Modified independent Where Assessed-Grooming: Standing at sink Upper Body Bathing: Modified independent Where Assessed-Upper Body Bathing: Shower Lower Body Bathing: Modified independent Where Assessed-Lower Body Bathing: Shower Upper Body Dressing: Modified independent (Device) Where Assessed-Upper Body Dressing: Edge of bed Lower Body Dressing: Modified independent Where Assessed-Lower Body Dressing: Edge of bed Toileting: Modified independent Where Assessed-Toileting: Glass blower/designer: Diplomatic Services operational officer Method: Counselling psychologist: Geophysical data processor: Distant supervision Social research officer, government Method: Heritage manager: Civil engineer, contracting with back Vision Baseline  Vision/History: Wears glasses Wears Glasses: Reading only (reader) Patient Visual Report: No change from baseline Vision Assessment?: No apparent visual deficits Perception  Perception: Within Functional Limits Praxis Praxis: Intact Cognition Overall Cognitive Status: Within Functional Limits for tasks assessed Arousal/Alertness: Awake/alert Orientation Level: Oriented X4 Attention: Alternating Alternating Attention: Appears intact Memory: Appears intact Awareness: Appears intact Problem Solving: Appears intact Safety/Judgment: Appears intact Sensation Sensation Light Touch: Impaired by gross assessment Proprioception: Appears Intact Additional Comments: decreased sensation along RLE Coordination Gross Motor Movements are Fluid and Coordinated: No Fine Motor Movements are Fluid and Coordinated: Yes Coordination and Movement Description: mild uncoordination due to weakness and fatigue Finger Nose Finger Test: [facility] bilaterally Heel Shin Test: [facility] bilaterally Motor  Motor Motor: Within Functional Limits Motor - Skilled Clinical Observations: fatigue and generalized weakness Mobility  Bed Mobility Bed Mobility: Rolling Right;Rolling Left;Sit to Supine;Supine to Sit Rolling Right: Independent with assistive device Rolling Left: Independent with assistive device Supine to Sit: Independent with assistive device Sit to Supine: Independent with assistive device Transfers Sit to Stand: Independent with assistive device Stand to Sit: Independent with assistive device  Trunk/Postural Assessment  Cervical Assessment Cervical Assessment: Within Functional Limits Thoracic Assessment Thoracic Assessment: Within Functional Limits Lumbar Assessment Lumbar Assessment: Exceptions to [facility] (posterior pelvic tilt) Postural Control Postural Control: Deficits on evaluation  Balance Balance Balance Assessed: Yes Standardized Balance Assessment Standardized Balance Assessment: Timed Up and  Go Test Timed Up and Go Test TUG: Normal TUG Normal TUG (seconds): 12.6 Static Sitting Balance Static Sitting - Balance Support: Feet supported;No upper extremity supported Static Sitting - Level of Assistance: 7: Independent Dynamic Sitting Balance Dynamic Sitting - Balance Support: Feet supported;No upper extremity supported Dynamic Sitting - Level of Assistance: 7: Independent Static Standing Balance Static Standing - Balance Support: Bilateral upper extremity supported (RW) Static Standing - Level of Assistance: 6: Modified independent (Device/Increase time) Dynamic Standing Balance Dynamic Standing - Balance Support: Bilateral upper extremity supported (  RW) Dynamic Standing - Level of Assistance: 6: Modified independent (Device/Increase time) Extremity/Trunk Assessment RUE Assessment RUE Assessment: Within Functional Limits General Strength Comments: grossly 5/5 LUE Assessment LUE Assessment: Within Functional Limits General Strength Comments: grossly 5/5   ,  03/05/2020, 9:52 AM  

## 2020-03-05 NOTE — Discharge Instructions (Signed)
Inpatient Rehab Discharge Instructions  Carrie Cook Discharge date and time: 03/05/20   Activities/Precautions/ Functional Status: Activity: no lifting, driving, or strenuous exercise till cleared by MD Diet: regular diet Wound Care: keep wound clean and dry    Functional status:  ___ No restrictions     ___ Walk up steps independently ___ 24/7 supervision/assistance   ___ Walk up steps with assistance _X__ Intermittent supervision/assistance  ___ Bathe/dress independently ___ Walk with walker     ___ Bathe/dress with assistance ___ Walk Independently    ___ Shower independently ___ Walk with assistance    __X_ Shower with assistance _X__ No alcohol     ___ Return to work/school ________   Special Instructions:  COMMUNITY REFERRALS UPON DISCHARGE:    Outpatient: PT             Agency:[facility]   [phone]              Appointment Date/Time:*WILL CONTACT YOU TO SET UP APPOINTMENTS  Medical Equipment/Items Ordered:TUB SEAT                                                 Agency/Supplier:[facility]  [phone]     My questions have been answered and I understand these instructions. I will adhere to these goals and the provided educational materials after my discharge from the hospital.  Patient/Caregiver Signature _______________________________ Date __________  Clinician Signature _______________________________________ Date __________  Please bring this form and your medication list with you to all your follow-up doctor's appointments.

## 2020-03-05 NOTE — Progress Notes (Signed)
Occupational Therapy Session Note  Patient Details  Name: Carrie Cook MRN: [id number] Date of Birth: [birth year]  Today's Date: 03/05/2020 OT Individual Time: 0768-0881 OT Individual Time Calculation (min): 72 min    Short Term Goals: Week 1:  OT Short Term Goal 1 (Week 1): STG = LTGs due to ELOS  Skilled Therapeutic Interventions/Progress Updates:    Treatment session with focus on self-care retraining, home management, and overall adherence to back precautions during all functional tasks.  Pt received seated upright in arm chair in room.  Pt expressing desire to shower. Discussed goals and encouraged pt to complete bathing/dressing and gathering of items without assist from therapist as pt to be Mod I at home.  Pt demonstrated good use of reacher and body positioning to maintain back precautions when retrieving items from dresser drawers.  Pt completed shower and dressing at Mod I level.  Discussed standing vs sitting for various dressing tasks to increase safety.  Pt ambulated to ADL apt with RW Mod I.  Engaged in furniture transfers and meal prep in ADL kitchen with RW.  Pt able to complete sit > stand from low couch surface demonstrating good body mechanics.  Discussed use of counter tops, walker bag, and even recommended walker tray for transporting items in kitchen.  Pt able to obtain items from refrigerator and use counter tops to transport items to simulate microwaving meals, per typical routine.  Discussed use of cups and tupperware with lids to decrease spillage and allow use of walker bag for transporting more items.  Pt ambulated back to room and remained seated EOB with all needs in reach.  Therapy Documentation Precautions:  Precautions Precautions: Back,Fall Precaution Booklet Issued: No Precaution Comments: Pt is able to verbalize back precautions but requires cues to maintain during session Required Braces or Orthoses:  (no brace needs per order) Restrictions Weight  Bearing Restrictions: No General:   Vital Signs:   Pain: Pain Assessment Pain Scale: 0-10 Pain Score: 0-No pain ADL: ADL Equipment Provided: Reacher Eating: Independent Grooming: Modified independent Where Assessed-Grooming: Standing at sink Upper Body Bathing: Modified independent Where Assessed-Upper Body Bathing: Shower Lower Body Bathing: Modified independent Where Assessed-Lower Body Bathing: Shower Upper Body Dressing: Modified independent (Device) Where Assessed-Upper Body Dressing: Edge of bed Lower Body Dressing: Modified independent Where Assessed-Lower Body Dressing: Edge of bed Toileting: Modified independent Where Assessed-Toileting: Glass blower/designer: Diplomatic Services operational officer Method: Counselling psychologist: Geophysical data processor: Distant supervision Social research officer, government Method: Heritage manager: Civil engineer, contracting with back   Therapy/Group: Individual Therapy  Simonne Come 03/05/2020, 9:52 AM

## 2020-03-05 NOTE — Progress Notes (Signed)
[place] PHYSICAL MEDICINE & REHABILITATION PROGRESS NOTE  Subjective/Complaints: Patient seen standing up, working with therapies. She states she slept well overnight.  She denies complaints.  She feels good.  Discussed plans to make patient mod I in room with therapies.  She is excited for discharge tomorrow.  ROS: Denies CP, SOB, N/V/D  Objective: Vital Signs: Blood pressure 138/80, pulse 84, temperature 97.8 F (36.6 C), temperature source Oral, resp. rate 16, height 5\' 3"  (1.6 m), weight 83.2 kg, last menstrual period 07/10/2013, SpO2 96 %. DG Chest 2 View  Result Date: 03/04/2020 CLINICAL DATA:  60 year old female who woke with chest tightness and finger numbness this morning. EXAM: CHEST - 2 VIEW COMPARISON:  Chest radiographs 07/31/2012 and earlier. FINDINGS: Lung volumes and mediastinal contours remain normal. Both lungs appear clear. No pneumothorax or pleural effusion. Visualized tracheal air column is within normal limits. No acute osseous abnormality identified. Increased lower thoracic endplate spurring since 2014. Negative visible bowel gas. IMPRESSION: Negative.  No acute cardiopulmonary abnormality. Electronically Signed   By: Genevie Ann M.D.   On: 03/04/2020 09:12   No results for input(s): WBC, HGB, HCT, PLT in the last 72 hours. Recent Labs    03/03/20 0445  NA 140  K 4.5  CL 103  CO2 27  GLUCOSE 135*  BUN 12  CREATININE 0.76  CALCIUM 9.1    Intake/Output Summary (Last 24 hours) at 03/05/2020 0905 Last data filed at 03/05/2020 0802 Gross per 24 hour  Intake 600 ml  Output --  Net 600 ml        Physical Exam: BP 138/80 (BP Location: Left Arm)   Pulse 84   Temp 97.8 F (36.6 C) (Oral)   Resp 16   Ht 5\' 3"  (1.6 m)   Wt 83.2 kg   LMP 07/10/2013   SpO2 96%   BMI 32.49 kg/m  Constitutional: No distress . Vital signs reviewed. HENT: Normocephalic.  Atraumatic. Eyes: EOMI. No discharge. Cardiovascular: No JVD.  RRR. Respiratory: Normal effort.  No  stridor.  Bilateral clear to auscultation. GI: Non-distended.  BS +. Skin: Warm and dry.  Intact. Psych: Normal mood.  Normal behavior. Musc: No edema in extremities.  No tenderness in extremities. Neuro: Alert Motor: Bilateral upper extremities: 5/5 proximal and distal Right lower extremity: Hip flexion, knee extension 4+/5, ankle dorsiflexion 4+/5, stable Left lower extremity: Hip flexion, knee extension 4/5, ankle dorsiflexion 4/5, stable  Assessment/Plan: 1. Functional deficits which require 3+ hours per day of interdisciplinary therapy in a comprehensive inpatient rehab setting.  Physiatrist is providing close team supervision and 24 hour management of active medical problems listed below.  Physiatrist and rehab team continue to assess barriers to discharge/monitor patient progress toward functional and medical goals   Care Tool:  Bathing    Body parts bathed by patient: Right arm,Left arm,Chest,Abdomen,Front perineal area,Buttocks,Right upper leg,Left upper leg,Face,Right lower leg,Left lower leg   Body parts bathed by helper: Right lower leg,Left lower leg     Bathing assist Assist Level: Supervision/Verbal cueing     Upper Body Dressing/Undressing Upper body dressing   What is the patient wearing?: Pull over shirt,Bra    Upper body assist Assist Level: Supervision/Verbal cueing    Lower Body Dressing/Undressing Lower body dressing      What is the patient wearing?: Underwear/pull up,Pants     Lower body assist Assist for lower body dressing: Supervision/Verbal cueing     Toileting Toileting    Toileting assist Assist for toileting: Independent  with assistive device     Transfers Chair/bed transfer  Transfers assist     Chair/bed transfer assist level: Independent with assistive device Chair/bed transfer assistive device: Programmer, multimedia   Ambulation assist   Ambulation activity did not occur: N/A  Assist level: Independent  with assistive device Assistive device: Walker-rolling Max distance: 155ft   Walk 10 feet activity   Assist  Walk 10 feet activity did not occur: N/A  Assist level: Independent with assistive device Assistive device: Walker-rolling   Walk 50 feet activity   Assist Walk 50 feet with 2 turns activity did not occur: N/A  Assist level: Independent with assistive device Assistive device: Walker-rolling    Walk 150 feet activity   Assist Walk 150 feet activity did not occur: N/A  Assist level: Independent with assistive device Assistive device: Walker-rolling    Walk 10 feet on uneven surface  activity   Assist Walk 10 feet on uneven surfaces activity did not occur: N/A   Assist level: Supervision/Verbal cueing Assistive device: Aeronautical engineer Will patient use wheelchair at discharge?: No Type of Wheelchair: Manual    Wheelchair assist level: Supervision/Verbal cueing Max wheelchair distance: 174ft    Wheelchair 50 feet with 2 turns activity    Assist        Assist Level: Supervision/Verbal cueing   Wheelchair 150 feet activity     Assist      Assist Level: Supervision/Verbal cueing    Medical Problem List and Plan: 1.  B/L LE weakness, Left > Right with impaired function/ADLs/mobility s/p microdiscectomy and open decompression (L3-L5) secondary to Lumbar stenosis with myelopathy.   Continue CIR, patient education  Plan for DC tomorrow  Will see patient for hospital follow-up in 1 month post-discharge 2.  Antithrombotics: -DVT/anticoagulation:  Pharmaceutical: Lovenox  CBC within normal time 2/7             -antiplatelet therapy: N/a 3. Pain Management:              On Baclofen tid for muscle spasms.              Neurontin 400 mg qid             Oxycodone prn  Amitriptyline 10mg  HS  Controlled with meds on 2/11  Monitor with increased exertion 4. Mood: LCSW to follow for evaluation and support.               -antipsychotic agents: See #11 5. Neuropsych: This patient is capable of making decisions on her own behalf. 6. Skin/Wound Care: Routine pressure relief measures.  7. Fluids/Electrolytes/Nutrition: Monitor I/Os.              Added prosource to promote healing.  8.  Drug-induced constipation:   Continue Miralax daily  Senna S DC'd on 2/20  Bowel meds decreased on 2/3, DC'd on 2/6  Improved 9. HTN: Monitor BP -continue Norvasc, Cozaar, Clonidine.   Controlled on 2/11             Monitor increase mobility 10. T2DM with hyperglycemia: Hgb A1c-6.4 and controlled. Will monitor BS ac/hs--use SSI prn for better control.               Continue metformin bid increased on 2/8.              Resumed Lantus 5 units at bedtime  Slightly labile on 2/11, continue to monitor ambulatory setting with potential further adjustments  as necessary  Monitor the increased mobility 11. Anxiety/depression: On Effexor bid, Lamictal bid for mood stabilization.               She had stopped taking Abilify X 5 month-->doesn't want it.  12. H/o Gastric bypass: Had stopped taking her vitamins. Continue multivitamin.   [facility].    13. Leucocytosis: Resolved 14. Muscle spasms  See #3 15. Urinary retention-?  Resolved  PVRs unremarkable.  16.  Hypoalbuminemia  Supplement initiated 17.  Chest pain: Resolved  Stat EKG ordered-personally reviewed -normal sinus rhythm  Troponin WNL  Likely due to anxiety  LOS: 10 days A FACE TO FACE EVALUATION WAS PERFORMED  Henya Aguallo Lorie Phenix 03/05/2020, 9:05 AM

## 2020-03-05 NOTE — Discharge Summary (Addendum)
Physician Discharge Summary  Patient ID: Carrie Cook MRN: [id number] DOB/AGE: [birth year] 60 y.o.  Admit date: 02/24/2020 Discharge date: 03/06/2020  Discharge Diagnoses:  Principal Problem:   Myelopathy concurrent with and due to stenosis of lumbar spine ([place]) Active Problems:   Lumbar radiculopathy   Hypoalbuminemia due to protein-calorie malnutrition (HCC)   Muscle spasm   Controlled type 2 diabetes mellitus with hyperglycemia, without long-term current use of insulin (HCC)   Essential hypertension   Drug induced constipation   Postoperative pain   Discharged Condition: stable   Significant Diagnostic Studies: DG Chest 2 View  Result Date: 03/04/2020 CLINICAL DATA:  60 year old female who woke with chest tightness and finger numbness this morning. EXAM: CHEST - 2 VIEW COMPARISON:  Chest radiographs 07/31/2012 and earlier. FINDINGS: Lung volumes and mediastinal contours remain normal. Both lungs appear clear. No pneumothorax or pleural effusion. Visualized tracheal air column is within normal limits. No acute osseous abnormality identified. Increased lower thoracic endplate spurring since 2014. Negative visible bowel gas. IMPRESSION: Negative.  No acute cardiopulmonary abnormality. Electronically Signed   By: Genevie Ann M.D.   On: 03/04/2020 09:12   Labs:  Basic Metabolic Panel: BMP Latest Ref Rng & Units 03/03/2020 02/25/2020 02/15/2020  Glucose 70 - 99 mg/dL 135(H) 130(H) 172(H)  BUN 6 - 20 mg/dL 12 10 7   Creatinine 0.44 - 1.00 mg/dL 0.76 0.85 0.72  BUN/Creat Ratio 9 - 23 - - -  Sodium 135 - 145 mmol/L 140 137 137  Potassium 3.5 - 5.1 mmol/L 4.5 4.5 3.7  Chloride 98 - 111 mmol/L 103 99 103  CO2 22 - 32 mmol/L 27 28 23   Calcium 8.9 - 10.3 mg/dL 9.1 9.0 8.7(L)    CBC: CBC Latest Ref Rng & Units 03/01/2020 02/25/2020 02/15/2020  WBC 4.0 - 10.5 K/uL 7.8 9.1 13.2(H)  Hemoglobin 12.0 - 15.0 g/dL 13.9 12.9 14.1  Hematocrit 36.0 - 46.0 % 41.4 38.8 41.9  Platelets 150 - 400 K/uL 280 328  322    CBG: Recent Labs  Lab 03/04/20 1133 03/04/20 1619 03/04/20 2120 03/05/20 0610 03/05/20 1127  GLUCAP 117* 168* 101* 147* 101*    Brief HPI:   Carrie Cook is a 60 y.o. female with history of HTN, T2DM, gastric bypass, depression anxiety, RLE pain with work-up revealing moderate to severe spinal stenosis L3-L4.  She underwent L3-L4 L4-L5 decompression 01/19 at surgical center by Dr. Venetia Constable.  Postop had new onset left foot drop with severe muscle spasms, severe pain and fall.  MRI spine showed severe spinal canal narrowing L3-L5 levels with question of postprocedural edema contributing to stenosis.  She underwent bilateral L3-4 and L4-5 facetectomy with decompression of thecal nerve roots on 01/22.  Postop has had issues with neuropathy, significant muscle spasms as well as left lower extremity weakness.  Baclofen was added with improvement in spasticity and constipation had resolved with augmentation of bowel regimen.  She continued to have limitations due to BLE weakness with ataxia, sensory deficits, balance deficits with anterior lean as well as reports of nausea/dizziness with activity.  CIR was recommended due to functional decline.   Hospital Course: MYRACLE FEBRES was admitted to rehab 02/24/2020 for inpatient therapies to consist of PT and OT at least three hours five days a week. Past admission physiatrist, therapy team and rehab RN have worked together to provide customized collaborative inpatient rehab.  Her blood pressures were monitored on TID basis and have been controlled on current regimen..  Muscle spasms have  improved on current dose of baclofen.  He continues to have issues with neuropathy and Neurontin was titrated up to 4 times daily with addition of amitriptyline 10 mg p.o. nightly.  Bowel program was augmented to assist with OIC.  With improvement in mobility bowel function has improved and Senokot and MiraLAX have been DC'd with patient advised to use this on as  needed basis.    Her diabetes has been monitored with achs CBG checks and Metformin was increased to 50 mg twice daily.  Home dose Lantus was resumed at 5 units and patient advised to slowly titrate up to home dose if blood sugars are consistently running above 120.  Her back incision is C/D/I and is healing well without any signs or symptoms of infection.  Mood is relatively stable on Effexor and Lamictal however patient reports multiple family stressors and was advised to resume Abilify and follow-up with mental health for further adjustment after discharge.  She did have an episode of chest pain felt to be due to anxiety as work-up was negative.  She has made good gains and is modified independent overall and requires supervision for shower transfers.  She will continue to receive outpatient PT at [facility] outpatient rehab after discharge.   Rehab course: During patient's stay in rehab weekly team conferences were held to monitor patient's progress, set goals and discuss barriers to discharge. At admission, patient required min mod assist with ADL tasks and mod assist with mobility.  She  has had improvement in activity tolerance, balance, postural control as well as ability to compensate for deficits. She is able to complete ADL tasks at modified independent level. She is modified independent for transfers and for mobility with use of RW. Family education was completed.    Disposition: Home  Diet: Regular  Special Instructions: 1. Continue back precautions.  2. No driving till cleared by MD. 3.  Monitor blood sugars before meals and at bedtime.  Slowly increase Lantus by 5 units every 1-2 days if blood sugars trend over 120 consistently.    Allergies as of 03/06/2020       Reactions   Qvar [beclomethasone] Shortness Of Breath, Other (See Comments)   Patient states that this medication causes her to feel short of breath   Sulfa Antibiotics Hives, Rash   Tizanidine Shortness Of Breath,  Itching, Rash   Ace Inhibitors Cough   Erythromycin Ethylsuccinate Hives, Other (See Comments)   nausea   Hydrochlorothiazide Cough   Prozac [fluoxetine Hcl] Hives   Sulfonamide Derivatives Hives, Rash   Dyazide [hydrochlorothiazide W-triamterene] Cough   Erythromycin Hives, Other (See Comments)   nausea   Lipitor [atorvastatin Calcium] Other (See Comments)   Body aches, flu-like symptoms   Lisinopril Cough   Pravastatin Other (See Comments)   BODY ACHES, FLU-LIKE SX.        Medication List     STOP taking these medications    methocarbamol 750 MG tablet Commonly known as: ROBAXIN   oxyCODONE-acetaminophen 5-325 MG tablet Commonly known as: PERCOCET/ROXICET       TAKE these medications    acetaminophen 500 MG tablet Commonly known as: TYLENOL Take 1 tablet (500 mg total) by mouth every 6 (six) hours as needed. What changed:  how much to take reasons to take this   albuterol 108 (90 Base) MCG/ACT inhaler Commonly known as: VENTOLIN HFA Inhale 2 puffs into the lungs every 6 (six) hours as needed for wheezing.   amitriptyline 10 MG tablet Commonly  known as: ELAVIL Take 1 tablet (10 mg total) by mouth at bedtime.   amLODipine 2.5 MG tablet Commonly known as: NORVASC Take 1 tablet (2.5 mg total) by mouth daily. What changed:  medication strength how much to take   baclofen 10 MG tablet Commonly known as: LIORESAL Take 1 tablet (10 mg total) by mouth 3 (three) times daily.   Biofreeze Colorless 4 % Gel Generic drug: Menthol (Topical Analgesic) Apply 1 application topically daily as needed (mucle pain).   cetirizine 10 MG tablet Commonly known as: ZYRTEC Take 10 mg by mouth daily.   cloNIDine 0.1 MG tablet Commonly known as: CATAPRES Take 1 tablet (0.1 mg total) by mouth daily.   ergocalciferol 1.25 MG (50000 UT) capsule Commonly known as: VITAMIN D2 Take 1 capsule (50,000 Units total) by mouth once a week. What changed: when to take this    estradiol 2 MG tablet Commonly known as: ESTRACE Take 1 tablet (2 mg total) by mouth at bedtime. What changed: when to take this   fluticasone 220 MCG/ACT inhaler Commonly known as: Flovent HFA Inhale 2 puffs into the lungs 2 (two) times daily. What changed:  when to take this reasons to take this   gabapentin 400 MG capsule Commonly known as: NEURONTIN Take 1 capsule (400 mg total) by mouth 4 (four) times daily.   lamoTRIgine 100 MG tablet Commonly known as: LAMICTAL Take 100 mg by mouth 2 (two) times daily.   Lantus SoloStar 100 UNIT/ML Solostar Pen Generic drug: insulin glargine Use 5 units at bedtime. What changed: additional instructions   lidocaine 5 % Commonly known as: LIDODERM Place 1 patch onto the skin daily. Apply at 8 am and remove at 8 pm daily--purchase over the counter if insurance does not cover it.   losartan 100 MG tablet Commonly known as: COZAAR TAKE 1 TABLET BY MOUTH DAILY   metFORMIN 850 MG tablet Commonly known as: GLUCOPHAGE Take 1 tablet (850 mg total) by mouth 2 (two) times daily with a meal. What changed:  medication strength See the new instructions.   multivitamin with minerals Tabs tablet Take 1 tablet by mouth daily.   Pen Needles 32G X 4 MM Misc 32 g by Does not apply route daily.   rosuvastatin 5 MG tablet Commonly known as: CRESTOR TAKE 1 TABLET BY MOUTH EVERY DAY What changed:  how much to take how to take this when to take this additional instructions   venlafaxine 75 MG tablet Commonly known as: EFFEXOR Take 75 mg by mouth 2 (two) times daily with a meal.        Follow-up Information     Jamse Arn, MD Follow up.   Specialty: Physical Medicine and Rehabilitation Why: office will call you with follow up appointment Contact information: [address] [place] [address] [phone]         Kathyrn Drown, MD. Call.   Specialty: Family Medicine Why: for post hospital follow  up Contact information: [address] [place] [address] [phone]                 Signed: Bary Leriche 03/08/2020, 6:01 PM Patient was seen, face-face, and physical exam performed by me on day of discharge, greater than 30 minutes of total time spent.. Please see progress note from day of discharge as well.  Delice Lesch, MD, ABPMR

## 2020-03-06 LAB — GLUCOSE, CAPILLARY: Glucose-Capillary: 124 mg/dL — ABNORMAL HIGH (ref 70–99)

## 2020-03-06 NOTE — Plan of Care (Signed)
  Problem: Consults Goal: RH SPINAL CORD INJURY PATIENT EDUCATION Description:  See Patient Education module for education specifics.  Outcome: Progressing Goal: Skin Care Protocol Initiated - if Braden Score 18 or less Description: If consults are not indicated, leave blank or document N/A Outcome: Progressing Goal: Nutrition Consult-if indicated Outcome: Progressing Goal: Diabetes Guidelines if Diabetic/Glucose > 140 Description: If diabetic or lab glucose is > 140 mg/dl - Initiate Diabetes/Hyperglycemia Guidelines & Document Interventions  Outcome: Progressing   Problem: SCI BOWEL ELIMINATION Goal: RH STG MANAGE BOWEL WITH ASSISTANCE Description: STG Manage Bowel with mod I Assistance. Outcome: Progressing   Problem: SCI BLADDER ELIMINATION Goal: RH STG MANAGE BLADDER WITH ASSISTANCE Description: STG Manage Bladder With mod I Assistance Outcome: Progressing   Problem: RH SKIN INTEGRITY Goal: RH STG SKIN FREE OF INFECTION/BREAKDOWN Outcome: Progressing Goal: RH STG MAINTAIN SKIN INTEGRITY WITH ASSISTANCE Description: STG Maintain Skin Integrity With mod I Assistance. Outcome: Progressing Goal: RH STG ABLE TO PERFORM INCISION/WOUND CARE W/ASSISTANCE Description: STG Able To Perform Incision/Wound Care With Assistance. Outcome: Progressing   Problem: RH SAFETY Goal: RH STG ADHERE TO SAFETY PRECAUTIONS W/ASSISTANCE/DEVICE Description: STG Adhere to Safety Precautions With Assistance/Device. Outcome: Progressing   Problem: RH PAIN MANAGEMENT Goal: RH STG PAIN MANAGED AT OR BELOW PT'S PAIN GOAL Description: Pain scale <4/10 Outcome: Progressing   Problem: RH KNOWLEDGE DEFICIT SCI Goal: RH STG INCREASE KNOWLEDGE OF SELF CARE AFTER SCI Outcome: Progressing

## 2020-03-06 NOTE — Plan of Care (Signed)
  Problem: Consults Goal: RH SPINAL CORD INJURY PATIENT EDUCATION Description:  See Patient Education module for education specifics.  03/06/2020 1929 by Sanda Linger, LPN Outcome: Completed/Met 03/06/2020 1928 by Sanda Linger, LPN Outcome: Progressing Goal: Skin Care Protocol Initiated - if Braden Score 18 or less Description: If consults are not indicated, leave blank or document N/A 03/06/2020 1929 by Sanda Linger, LPN Outcome: Completed/Met 03/06/2020 1928 by Sanda Linger, LPN Outcome: Progressing Goal: Nutrition Consult-if indicated 03/06/2020 1929 by Renda Rolls L, LPN Outcome: Completed/Met 03/06/2020 1928 by Sanda Linger, LPN Outcome: Progressing Goal: Diabetes Guidelines if Diabetic/Glucose > 140 Description: If diabetic or lab glucose is > 140 mg/dl - Initiate Diabetes/Hyperglycemia Guidelines & Document Interventions  03/06/2020 1929 by Renda Rolls L, LPN Outcome: Completed/Met 03/06/2020 1928 by Sanda Linger, LPN Outcome: Progressing   Problem: SCI BOWEL ELIMINATION Goal: RH STG MANAGE BOWEL WITH ASSISTANCE Description: STG Manage Bowel with mod I Assistance. 03/06/2020 1929 by Sanda Linger, LPN Outcome: Completed/Met 03/06/2020 1928 by Sanda Linger, LPN Outcome: Progressing   Problem: SCI BLADDER ELIMINATION Goal: RH STG MANAGE BLADDER WITH ASSISTANCE Description: STG Manage Bladder With mod I Assistance 03/06/2020 1929 by Sanda Linger, LPN Outcome: Completed/Met 03/06/2020 1928 by Sanda Linger, LPN Outcome: Progressing   Problem: RH SKIN INTEGRITY Goal: RH STG SKIN FREE OF INFECTION/BREAKDOWN 03/06/2020 1929 by Renda Rolls L, LPN Outcome: Completed/Met 03/06/2020 1928 by Sanda Linger, LPN Outcome: Progressing Goal: RH STG MAINTAIN SKIN INTEGRITY WITH ASSISTANCE Description: STG Maintain Skin Integrity With mod I Assistance. 03/06/2020 1929 by Sanda Linger, LPN Outcome: Completed/Met 03/06/2020 1928 by Sanda Linger, LPN Outcome:  Progressing Goal: RH STG ABLE TO PERFORM INCISION/WOUND CARE W/ASSISTANCE Description: STG Able To Perform Incision/Wound Care With Assistance. 03/06/2020 1929 by Sanda Linger, LPN Outcome: Completed/Met 03/06/2020 1928 by Sanda Linger, LPN Outcome: Progressing   Problem: RH SAFETY Goal: RH STG ADHERE TO SAFETY PRECAUTIONS W/ASSISTANCE/DEVICE Description: STG Adhere to Safety Precautions With Assistance/Device. 03/06/2020 1929 by Sanda Linger, LPN Outcome: Completed/Met 03/06/2020 1928 by Sanda Linger, LPN Outcome: Progressing   Problem: RH PAIN MANAGEMENT Goal: RH STG PAIN MANAGED AT OR BELOW PT'S PAIN GOAL Description: Pain scale <4/10 03/06/2020 1929 by Renda Rolls L, LPN Outcome: Completed/Met 03/06/2020 1928 by Sanda Linger, LPN Outcome: Progressing   Problem: RH KNOWLEDGE DEFICIT SCI Goal: RH STG INCREASE KNOWLEDGE OF SELF CARE AFTER SCI 03/06/2020 1929 by Sanda Linger, LPN Outcome: Completed/Met 03/06/2020 1928 by Sanda Linger, LPN Outcome: Progressing

## 2020-03-06 NOTE — Progress Notes (Signed)
[facility] PHYSICAL MEDICINE & REHABILITATION PROGRESS NOTE  Subjective/Complaints: Pt up at EOB. Excited to be going home!  ROS: Patient denies fever, rash, sore throat, blurred vision, nausea, vomiting, diarrhea, cough, shortness of breath or chest pain, joint or back pain, headache, or mood change.    Objective: Vital Signs: Blood pressure 120/73, pulse 79, temperature 98.4 F (36.9 C), resp. rate 18, height 5\' 3"  (1.6 m), weight 83.2 kg, last menstrual period 07/10/2013, SpO2 99 %. DG Chest 2 View  Result Date: 03/04/2020 CLINICAL DATA:  60 year old female who woke with chest tightness and finger numbness this morning. EXAM: CHEST - 2 VIEW COMPARISON:  Chest radiographs 07/31/2012 and earlier. FINDINGS: Lung volumes and mediastinal contours remain normal. Both lungs appear clear. No pneumothorax or pleural effusion. Visualized tracheal air column is within normal limits. No acute osseous abnormality identified. Increased lower thoracic endplate spurring since 2014. Negative visible bowel gas. IMPRESSION: Negative.  No acute cardiopulmonary abnormality. Electronically Signed   By: Genevie Ann M.D.   On: 03/04/2020 09:12   No results for input(s): WBC, HGB, HCT, PLT in the last 72 hours. No results for input(s): NA, K, CL, CO2, GLUCOSE, BUN, CREATININE, CALCIUM in the last 72 hours.  Intake/Output Summary (Last 24 hours) at 03/06/2020 0847 Last data filed at 03/06/2020 0724 Gross per 24 hour  Intake 640 ml  Output --  Net 640 ml        Physical Exam: BP 120/73 (BP Location: Right Arm)   Pulse 79   Temp 98.4 F (36.9 C)   Resp 18   Ht 5\' 3"  (1.6 m)   Wt 83.2 kg   LMP 07/10/2013   SpO2 99%   BMI 32.49 kg/m  Constitutional: No distress . Vital signs reviewed. HEENT: EOMI, oral membranes moist Neck: supple Cardiovascular: RRR without murmur. No JVD    Respiratory/Chest: CTA Bilaterally without wheezes or rales. Normal effort    GI/Abdomen: BS +, non-tender, non-distended Ext:  no clubbing, cyanosis, or edema Psych: pleasant and cooperative Musc: No edema in extremities.  No tenderness in extremities. Neuro: Alert Motor: Bilateral upper extremities: 5/5 proximal and distal Right lower extremity: Hip flexion, knee extension 4+/5, ankle dorsiflexion 4+/5, stable Left lower extremity: Hip flexion, knee extension 4/5, ankle dorsiflexion 4/5, stable  Assessment/Plan: 1. Functional deficits which require 3+ hours per day of interdisciplinary therapy in a comprehensive inpatient rehab setting.  Physiatrist is providing close team supervision and 24 hour management of active medical problems listed below.  Physiatrist and rehab team continue to assess barriers to discharge/monitor patient progress toward functional and medical goals   Care Tool:  Bathing    Body parts bathed by patient: Right arm,Left arm,Chest,Abdomen,Front perineal area,Buttocks,Right upper leg,Left upper leg,Face,Right lower leg,Left lower leg   Body parts bathed by helper: Right lower leg,Left lower leg     Bathing assist Assist Level: Independent with assistive device     Upper Body Dressing/Undressing Upper body dressing   What is the patient wearing?: Pull over shirt,Bra    Upper body assist Assist Level: Independent with assistive device    Lower Body Dressing/Undressing Lower body dressing      What is the patient wearing?: Underwear/pull up,Pants     Lower body assist Assist for lower body dressing: Independent with assitive device     Toileting Toileting    Toileting assist Assist for toileting: Independent with assistive device     Transfers Chair/bed transfer  Transfers assist     Chair/bed transfer  assist level: Independent with assistive device Chair/bed transfer assistive device: Programmer, multimedia   Ambulation assist   Ambulation activity did not occur: N/A  Assist level: Independent with assistive device Assistive device:  Walker-rolling Max distance: 154ft   Walk 10 feet activity   Assist  Walk 10 feet activity did not occur: N/A  Assist level: Independent with assistive device Assistive device: Walker-rolling   Walk 50 feet activity   Assist Walk 50 feet with 2 turns activity did not occur: N/A  Assist level: Independent with assistive device Assistive device: Walker-rolling    Walk 150 feet activity   Assist Walk 150 feet activity did not occur: N/A  Assist level: Independent with assistive device Assistive device: Walker-rolling    Walk 10 feet on uneven surface  activity   Assist Walk 10 feet on uneven surfaces activity did not occur: N/A   Assist level: Supervision/Verbal cueing Assistive device: Aeronautical engineer Will patient use wheelchair at discharge?: No Type of Wheelchair: Manual    Wheelchair assist level: Supervision/Verbal cueing Max wheelchair distance: 130ft    Wheelchair 50 feet with 2 turns activity    Assist        Assist Level: Supervision/Verbal cueing   Wheelchair 150 feet activity     Assist      Assist Level: Supervision/Verbal cueing    Medical Problem List and Plan: 1.  B/L LE weakness, Left > Right with impaired function/ADLs/mobility s/p microdiscectomy and open decompression (L3-L5) secondary to Lumbar stenosis with myelopathy.   Continue CIR, patient education  DC home today  Will see patient for hospital follow-up in 1 month post-discharge 2.  Antithrombotics: -DVT/anticoagulation:  Pharmaceutical: Lovenox  CBC within normal   2/7             -antiplatelet therapy: N/a 3. Pain Management:              On Baclofen tid for muscle spasms.              Neurontin 400 mg qid             Oxycodone prn  Amitriptyline 10mg  HS  Controlled with meds on 2/12  Monitor with increased exertion 4. Mood: LCSW to follow for evaluation and support.              -antipsychotic agents: See #11 5. Neuropsych:  This patient is capable of making decisions on her own behalf. 6. Skin/Wound Care: Routine pressure relief measures.  7. Fluids/Electrolytes/Nutrition: Monitor I/Os.              Added prosource to promote healing.  8.  Drug-induced constipation:   Continue Miralax daily  Senna S DC'd on 2/20  Bowel meds decreased on 2/3, DC'd on 2/6  Improved 9. HTN: Monitor BP -continue Norvasc, Cozaar, Clonidine.   Controlled on 2/12             Monitor increase mobility 10. T2DM with hyperglycemia: Hgb A1c-6.4 and controlled. Will monitor BS ac/hs--use SSI prn for better control.               Continue metformin bid increased on 2/8.              Resumed Lantus 5 units at bedtime  Controlled 2/12 11. Anxiety/depression: On Effexor bid, Lamictal bid for mood stabilization.               She had stopped taking Abilify X  5 month-->doesn't want it.  12. H/o Gastric bypass: Had stopped taking her vitamins. Continue multivitamin.   [facility].    13. Leucocytosis: Resolved 14. Muscle spasms  See #3 15. Urinary retention-?  Resolved  PVRs unremarkable.  16.  Hypoalbuminemia  Supplement initiated 17.  Chest pain: Resolved    Likely due to anxiety  LOS: 11 days A FACE TO FACE EVALUATION WAS PERFORMED  Meredith Staggers 03/06/2020, 8:47 AM

## 2020-03-06 NOTE — Progress Notes (Signed)
Patient discharged from rehab with family member via car. Sanda Linger, LPN

## 2020-03-08 ENCOUNTER — Telehealth: Payer: Self-pay

## 2020-03-08 NOTE — Telephone Encounter (Signed)
Transition Care Management Follow-up Telephone Call  Date of discharge and from where: 03/06/20 from [place]  How have you been since you were released from the hospital? Pt states that she is doing well and is in good spirits.   Any questions or concerns? No  Items Reviewed:  Did the pt receive and understand the discharge instructions provided? Yes   Medications obtained and verified? Yes   Other? No   Any new allergies since your discharge? No   Dietary orders reviewed? Heart Healthy  Do you have support at home? Yes   Functional Questionnaire: (I = Independent and D = Dependent) ADLs: I  Bathing/Dressing- I  Meal Prep- I  Eating- I  Maintaining continence- I  Transferring/Ambulation- D - due to surgery  Managing Meds- I  Follow up appointments reviewed:   [facility] f/u appt confirmed? Yes  Scheduled to see Margie Billet, PT on 03/17/20 @ 3:30pm.  Are transportation arrangements needed? No  If their condition worsens, is the pt aware to call PCP or go to the Emergency Dept.? Yes Was the patient provided with contact information for the PCP's office or ED? Yes Was to pt encouraged to call back with questions or concerns? Yes

## 2020-03-09 ENCOUNTER — Telehealth: Payer: Self-pay | Admitting: Family Medicine

## 2020-03-09 ENCOUNTER — Other Ambulatory Visit: Payer: Self-pay | Admitting: Family Medicine

## 2020-03-09 NOTE — Telephone Encounter (Signed)
Discussed with pt. Pt states no acute issues right now and she will look at her schedule and then call back to schedule a hospital follow up within 7 days

## 2020-03-09 NOTE — Telephone Encounter (Signed)
Nurse's-patient recently discharged from the hospital. Please call patient, let them know that we are aware that they were discharged from the hospital. Please schedule them to follow-up with [place] within the next 7 days. Advised the patient to bring all of their medications with him to the visit. Please inquire if they are having any acute issues currently and documented accordingly.  Nurses-this patient was in the hospital for back surgery and myelopathy having some complications as well with her diabetes.  It would be in her best interest to do a follow-up visit with [place] within the next 2 to 3 weeks regarding her diabetes sooner if she would like  to do so.  Please encourage a follow-up office visit to be scheduled.  Patient to bring glucose readings to that visit as well

## 2020-03-15 ENCOUNTER — Other Ambulatory Visit: Payer: Self-pay

## 2020-03-15 ENCOUNTER — Ambulatory Visit: Payer: Medicare Other | Admitting: Family Medicine

## 2020-03-15 ENCOUNTER — Encounter: Payer: Self-pay | Admitting: Family Medicine

## 2020-03-15 ENCOUNTER — Ambulatory Visit (INDEPENDENT_AMBULATORY_CARE_PROVIDER_SITE_OTHER): Payer: Medicare Other | Admitting: Family Medicine

## 2020-03-15 VITALS — BP 117/84 | Ht 63.0 in | Wt 189.0 lb

## 2020-03-15 DIAGNOSIS — G992 Myelopathy in diseases classified elsewhere: Secondary | ICD-10-CM

## 2020-03-15 DIAGNOSIS — M5116 Intervertebral disc disorders with radiculopathy, lumbar region: Secondary | ICD-10-CM

## 2020-03-15 DIAGNOSIS — E7849 Other hyperlipidemia: Secondary | ICD-10-CM

## 2020-03-15 DIAGNOSIS — Z794 Long term (current) use of insulin: Secondary | ICD-10-CM | POA: Diagnosis not present

## 2020-03-15 DIAGNOSIS — I10 Essential (primary) hypertension: Secondary | ICD-10-CM

## 2020-03-15 DIAGNOSIS — E119 Type 2 diabetes mellitus without complications: Secondary | ICD-10-CM | POA: Diagnosis not present

## 2020-03-15 DIAGNOSIS — M21372 Foot drop, left foot: Secondary | ICD-10-CM | POA: Diagnosis not present

## 2020-03-15 DIAGNOSIS — M48061 Spinal stenosis, lumbar region without neurogenic claudication: Secondary | ICD-10-CM | POA: Diagnosis not present

## 2020-03-15 MED ORDER — CLONIDINE HCL 0.1 MG PO TABS: 0.1000 mg | ORAL_TABLET | Freq: Every day | ORAL | 5 refills | Status: DC

## 2020-03-15 MED ORDER — AMITRIPTYLINE HCL 10 MG PO TABS
10.0000 mg | ORAL_TABLET | Freq: Every day | ORAL | 5 refills | Status: DC
Start: 2020-03-15 — End: 2020-06-15

## 2020-03-15 MED ORDER — LOSARTAN POTASSIUM 100 MG PO TABS: 100.0000 mg | ORAL_TABLET | Freq: Every day | ORAL | 1 refills | Status: DC

## 2020-03-15 MED ORDER — ROSUVASTATIN CALCIUM 5 MG PO TABS
5.0000 mg | ORAL_TABLET | Freq: Every day | ORAL | 1 refills | Status: DC
Start: 2020-03-15 — End: 2020-06-14

## 2020-03-15 MED ORDER — METFORMIN HCL 850 MG PO TABS: 850.0000 mg | ORAL_TABLET | Freq: Two times a day (BID) | ORAL | 1 refills | Status: DC

## 2020-03-15 MED ORDER — AMLODIPINE BESYLATE 2.5 MG PO TABS: 2.5000 mg | ORAL_TABLET | Freq: Every day | ORAL | 1 refills | Status: DC

## 2020-03-15 MED ORDER — GABAPENTIN 400 MG PO CAPS: 400.0000 mg | ORAL_CAPSULE | Freq: Four times a day (QID) | ORAL | 5 refills | Status: DC

## 2020-03-15 NOTE — Progress Notes (Signed)
Subjective:    Patient ID: Carrie Cook, female    DOB: 1960-10-17, 60 y.o.   MRN: [id number]  HPI  Patient arrives for a follow up after recent hospitalization and emergency back surgery. This patient had emergency back surgery she had had a standard procedure done then she had complications with swelling of the spinal cord, spinal stenosis, nerve claudication, severe weakness of the legs, she required emergency surgery then required admission into a physical therapy rehab facility and just recently got out She has underlying problems with blood pressure She does take medication for it on a regular basis She does have a history of reflux And also history of diabetes states her sugars been under good control recently Still has weakness into the legs and has to walk with a walker Went through a lot of stress with this dealing with depression her specialist wants to put her on Abilify but she does not want to go on that because of potential weight gain Review of Systems  Constitutional: Positive for fatigue. Negative for activity change and fever.  HENT: Negative for congestion and rhinorrhea.   Respiratory: Negative for cough, chest tightness and shortness of breath.   Cardiovascular: Negative for chest pain and leg swelling.  Gastrointestinal: Negative for abdominal pain and nausea.  Skin: Negative for color change.  Neurological: Positive for weakness. Negative for dizziness and headaches.  Psychiatric/Behavioral: Negative for agitation and behavioral problems.       Objective:   Physical Exam Vitals reviewed.  Constitutional:      General: She is not in acute distress.    Appearance: She is well-nourished.  HENT:     Head: Normocephalic.  Cardiovascular:     Rate and Rhythm: Normal rate and regular rhythm.     Heart sounds: Normal heart sounds. No murmur heard.   Pulmonary:     Effort: Pulmonary effort is normal.     Breath sounds: Normal breath sounds.   Musculoskeletal:        General: No edema.  Lymphadenopathy:     Cervical: No cervical adenopathy.  Neurological:     Mental Status: She is alert.  Psychiatric:        Behavior: Behavior normal.     She does have weakness in the legs more pronounced in the left foot and the lower leg difficult for her to stand up she has to use a walker to walk      Assessment & Plan:  1. Primary hypertension Blood pressure good control continue current measures continue medication watch diet  2. Type 2 diabetes mellitus without complication, with long-term current use of insulin (HCC) Previously A1c looks good watch diet take medication stay healthy with the diet and activity as best as possible  3. Other hyperlipidemia Continue cholesterol medicine watch diet closely  4. Left foot drop Physical therapy on a regular basis  5. Myelopathy concurrent with and due to stenosis of lumbar spine [facility]) Physical therapy on a regular basis Currently right now unable to work.  I believe it would be a good idea and I had the patient follow-up with [place] in 6 to 8 weeks for recheck  As for the depression continue to see her specialist they may need to adjust medication  Over the long haul would like to see the patient improved to the point where she is off the amitriptyline.  I have encouraged her over the course of the next 6 to 8 weeks to taper off of her  baclofen, and as she improves with her neuropathy and leg strength hopefully can taper down on the gabapentin.  For now no driving but hopefully at some point she will improve to the point of being able to drive

## 2020-03-17 ENCOUNTER — Ambulatory Visit (HOSPITAL_COMMUNITY): Payer: Medicare Other | Attending: Physical Medicine & Rehabilitation | Admitting: Physical Therapy

## 2020-03-17 ENCOUNTER — Other Ambulatory Visit: Payer: Self-pay

## 2020-03-17 ENCOUNTER — Encounter (HOSPITAL_COMMUNITY): Payer: Self-pay | Admitting: Physical Therapy

## 2020-03-17 DIAGNOSIS — M545 Low back pain, unspecified: Secondary | ICD-10-CM | POA: Insufficient documentation

## 2020-03-17 DIAGNOSIS — R29898 Other symptoms and signs involving the musculoskeletal system: Secondary | ICD-10-CM | POA: Insufficient documentation

## 2020-03-17 DIAGNOSIS — R2689 Other abnormalities of gait and mobility: Secondary | ICD-10-CM | POA: Insufficient documentation

## 2020-03-17 DIAGNOSIS — M6281 Muscle weakness (generalized): Secondary | ICD-10-CM | POA: Diagnosis not present

## 2020-03-17 NOTE — Therapy (Signed)
[place], [place], [address] Phone: [phone]   Fax:  [phone]  Physical Therapy Evaluation  Patient Details  Name: Carrie Cook MRN: [id number] Date of Birth: 02-20-1960 Referring Provider (PT): Ankit Lorie Phenix MD   Encounter Date: 03/17/2020   PT End of Session - 03/17/20 1623    Visit Number 1    Number of Visits 12    Date for PT Re-Evaluation 04/28/20    Authorization Type Medicare (add KX)    Progress Note Due on Visit 10    PT Start Time 1530    PT Stop Time 1615    PT Time Calculation (min) 45 min    Activity Tolerance Patient tolerated treatment well    Behavior During Therapy [facility] for tasks assessed/performed           Past Medical History:  Diagnosis Date  . Anemia    PT HAS HAD COLONOSCOPY AND ENDOSCOPY WORK UP - NO PROBLEMS FOUND AS SOURCE OF ANEMIA -- PT HAD IRON INFUSION AT [facility] 11/13/12-AFTER SEEING HEMATOLOGIST DR. Cabery AND HE GAVE HEMATOLOGIC CLEARANCE FOR GASTRIC BYPASS SURGERY.  Marland Kitchen Anxiety   . Asthma    daily and prn inhalers  . Degenerative joint disease    right knee, spine - STATES INTERMITTENT  NUMBNESS DOWN RT LEG WITH PROLONGED STANDING OR WALKING- THINKS RELATED TO HER SPINE PROBLEMS  . Dental crowns present   . Depression   . Family history of anesthesia complication    pt's mother and sister have hx. of post-op N/V  . Fibroids    UTERINE  . Gastroesophageal reflux disease    none for 2 years since Bariatric surgery.  . H/O hiatal hernia   . History of endometriosis   . History of kidney stones   . Hyperlipidemia   . Hypertension    under control with med., has been on med. x 2 yr.  . Insulin dependent diabetes mellitus   . Lock jaw    jaw locks open if opens mouth wide  . Migraines   . Neuropathy    FEET  . Obesity   . Palpitations   . PONV (postoperative nausea and vomiting)   . Shortness of breath    with daily activities  . Sleep apnea     post-op didn't need CPAP but now states needs it again but not using    Past Surgical History:  Procedure Laterality Date  . ABDOMINAL HYSTERECTOMY N/A 06/06/2017   Procedure: HYSTERECTOMY ABDOMINAL;  Surgeon: Florian Buff, MD;  Location: [facility];  Service: Gynecology;  Laterality: N/A;  . BACK SURGERY    . BREATH TEK H PYLORI N/A 08/13/2012   Procedure: BREATH TEK H PYLORI;  Surgeon: Edward Jolly, MD;  Location: [facility];  Service: General;  Laterality: N/A;  . CARPAL TUNNEL RELEASE  01/03/2012   Procedure: CARPAL TUNNEL RELEASE;  Surgeon: Wynonia Sours, MD;  Location: [facility];  Service: Orthopedics;  Laterality: Right;  . carpal tunnel release right hand Right 12/13  . COLONOSCOPY  05/2009   VPX:TGGYIRSW hemorrhoids otherwise normal colon, rectum and terminal ileum  . COLONOSCOPY WITH ESOPHAGOGASTRODUODENOSCOPY (EGD) N/A 10/28/2012   Procedure: COLONOSCOPY WITH ESOPHAGOGASTRODUODENOSCOPY (EGD);  Surgeon: Daneil Dolin, MD;  [facility];  Service: Endoscopy;  Laterality: N/A;  7:30  . DILITATION & CURRETTAGE/HYSTROSCOPY WITH NOVASURE ABLATION N/A 07/22/2014   Procedure: DILATATION & CURETTAGE/HYSTEROSCOPY WITH NOVASURE ABLATION; uterine length  6.0 cm; uterine width 3.8 cm; total ablation time 1 minute 15 seconds;  Surgeon: Florian Buff, MD;  Location: [facility];  Service: Gynecology;  Laterality: N/A;  . ESOPHAGOGASTRODUODENOSCOPY  5/013/2011   SNK:NLZJQB esophagus/multiple polyps removed s/p (hyperplastic). Gastritis without H.pylori.  . ESOPHAGOGASTRODUODENOSCOPY (EGD) WITH PROPOFOL N/A 06/06/2018   with LA Grade A esophagitis s/p dilation.   Marland Kitchen GASTRIC ROUX-EN-Y N/A 11/19/2012   Procedure: LAPAROSCOPIC ROUX-EN-Y GASTRIC;  Surgeon: Edward Jolly, MD;  Location: [facility];  Service: General;  Laterality: N/A;  . GIVENS CAPSULE STUDY N/A 10/28/2012   Procedure: GIVENS CAPSULE STUDY;  Surgeon: Daneil Dolin, MD;  [facility];  Service:  Endoscopy;  Laterality: N/A;  . KNEE ARTHROSCOPY WITH MEDIAL MENISECTOMY Right 10/28/2015   Procedure: RIGHT KNEE ARTHROSCOPY WITH MEDIAL MENISECTOMY;  Surgeon: Carole Civil, MD;  Location[facility];  Service: Orthopedics;  Laterality: Right;  . LAPAROSCOPY     due to endometriosis  . LUMBAR LAMINECTOMY/DECOMPRESSION MICRODISCECTOMY N/A 02/14/2020   Procedure: LUMBAR LAMINECTOMY  Lumbar three-four, Lumbar four-five;  Surgeon: Consuella Lose, MD;  Location: [facility];  Service: Neurosurgery;  Laterality: N/A;  . Venia Minks DILATION N/A 06/06/2018   Procedure: Venia Minks DILATION;  Surgeon: Daneil Dolin, MD;  [facility];  Service: Endoscopy;  Laterality: N/A;  . PELVIC LAPAROSCOPY  11/04/1999   with fulguration of endometriosis  . SALPINGOOPHORECTOMY Bilateral 06/06/2017   Procedure: SALPINGO OOPHORECTOMY;  Surgeon: Florian Buff, MD;  Location: [facility];  Service: Gynecology;  Laterality: Bilateral;  . TOTAL KNEE ARTHROPLASTY Right 07/22/2019   Procedure: TOTAL KNEE ARTHROPLASTY;  Surgeon: Carole Civil, MD;  Location[facility];  Service: Orthopedics;  Laterality: Right;  . TRIGGER FINGER RELEASE Right 09/24/2012   Procedure: RELEASE A-1 PULLEY OF RIGHT THUMB;  Surgeon: Wynonia Sours, MD;  Location: [facility];  Service: Orthopedics;  Laterality: Right;  . TUBAL LIGATION  1993    There were no vitals filed for this visit.    Subjective Assessment - 03/17/20 1533    Subjective Patient is a 60 y.o. female who presents to physical therapy with referral for lumbar myelopathy. Patient was having LBP with R radiculopathy and then had a microdiscectomy on 02/11/20. She then began having LLE symptoms and muscle spasms causing a fall and had a emergency surgery on L3-L5 laminectomy on 02/14/20. She is not exactly sure what happened. She is not sure how much function she will get back. Her main deficits now are balance and hip strength. She feels that the sciatica is starting on  both sides. Her left foot is less numb and starting to go back to baseline. Her RLE is starting to get worse again with burning into hips. She continues to have to use the walker. Her main goal is to improve her strength and balance.    Limitations Standing;Walking;House hold activities    How long can you walk comfortably? 500 feet    Patient Stated Goals improve strength and balance    Currently in Pain? Yes    Pain Score 3     Pain Location Back    Pain Orientation Lower    Pain Descriptors / Indicators Burning;Tingling    Pain Type Surgical pain              [facility] PT Assessment - 03/17/20 0001      Assessment   Medical Diagnosis Myelopathy lumbar region    Referring Provider (PT) Ankit Lorie Phenix MD  Next MD Visit Next week    Prior Therapy yes back      Precautions   Precautions Back      Restrictions   Weight Bearing Restrictions No      Balance Screen   Has the patient fallen in the past 6 months Yes    How many times? 1    Has the patient had a decrease in activity level because of a fear of falling?  Yes    Is the patient reluctant to leave their home because of a fear of falling?  No      Prior Function   Level of Independence Independent with household mobility with device    Vocation On disability      Cognition   Overall Cognitive Status Within Functional Limits for tasks assessed      Observation/Other Assessments   Observations Ambulates with RW    Focus on Therapeutic Outcomes (FOTO)  29% function      Sensation   Light Touch Impaired Detail    Light Touch Impaired Details Impaired RLE;Impaired LLE    Additional Comments Decreased R L3(TKA) S1, L L4-L5      AROM   Overall AROM  Unable to assess;Due to precautions      Strength   Right/Left Hip Right;Left    Right Hip Flexion 4+/5    Left Hip Flexion 4+/5    Right Knee Flexion 4+/5    Right Knee Extension 5/5    Left Knee Flexion 5/5    Left Knee Extension 5/5    Right Ankle Dorsiflexion  4/5    Left Ankle Dorsiflexion 4/5      Transfers   Five time sit to stand comments  12.35 seconds without Use of hands, bilateral dyanmic valgus R>L      Ambulation/Gait   Ambulation/Gait Yes    Ambulation/Gait Assistance 6: Modified independent (Device/Increase time)    Ambulation Distance (Feet) 300 Feet    Assistive device Rolling walker    Gait velocity decreased    Gait Comments 2 MWT                      Objective measurements completed on examination: See above findings.               PT Education - 03/17/20 1529    Education Details Patient educated on exam findings, POC, scope of PT, lumbar anatomy, beginning walking for endurance and activity tolerance    Person(s) Educated Patient    Methods Explanation;Demonstration    Comprehension Verbalized understanding;Returned demonstration            PT Short Term Goals - 03/17/20 1627      PT SHORT TERM GOAL #1   Title Patient will be independent with initial HEP and self-management strategies to improve functional outcomes    Time 3    Period Weeks    Status New    Target Date 04/07/20      PT SHORT TERM GOAL #2   Title Patient will report at least 25% improvement in symptoms for improved quality of life.    Time 3    Period Weeks    Status New    Target Date 04/07/20             PT Long Term Goals - 03/17/20 1628      PT LONG TERM GOAL #1   Title Patient will report at least 75% improvement in symptoms for improved  quality of life.    Time 6    Period Weeks    Status New    Target Date 04/28/20      PT LONG TERM GOAL #2   Title Patient will improve FOTO score by at least 10 points in order to indicate improved tolerance to activity.    Time 6    Period Weeks    Status New    Target Date 04/28/20      PT LONG TERM GOAL #3   Title Patient will be able to complete 5x STS in under 11.4 seconds in order to reduce the risk of falls.    Time 6    Period Weeks    Status New     Target Date 04/28/20      PT LONG TERM GOAL #4   Title Patient will be able to ambulate at least 450 feet in 2MWT in order to demonstrate improved gait speed for community ambulation.    Time 6    Period Weeks    Status New    Target Date 04/28/20                  Plan - 03/17/20 1624    Clinical Impression Statement Patient is a 60 y.o. female who presents to physical therapy with referral for lumbar myelopathy. She presents with pain limited deficits in lumbar and LE strength, ROM, endurance, gait, balance, and functional mobility with ADL. She is having to modify and restrict ADL as indicated by FOTO score as well as subjective information and objective measures which is affecting overall participation. Patient will benefit from skilled physical therapy in order to improve function and reduce impairment.    Personal Factors and Comorbidities Comorbidity 3+;Time since onset of injury/illness/exacerbation;Past/Current Experience;Fitness    Comorbidities Anxiety, Arthritis, Asthma, Back pain, BMI over 30,  Depression, Diabetes, High Blood Pressure, Prior Surgery    Examination-Activity Limitations Locomotion Level;Transfers;Squat;Bend;Sleep;Sit;Bed Mobility    Examination-Participation Restrictions Community Activity;Yard Work;Laundry;Cleaning;Shop;Volunteer    Stability/Clinical Decision Making Stable/Uncomplicated    Clinical Decision Making Low    Rehab Potential Fair    PT Frequency 2x / week    PT Duration 6 weeks    PT Treatment/Interventions ADLs/Self Care Home Management;Aquatic Therapy;Biofeedback;Cryotherapy;Electrical Stimulation;Moist Heat;DME Instruction;Gait training;Stair training;Functional mobility training;Therapeutic activities;Therapeutic exercise;Balance training;Neuromuscular re-education;Patient/family education;Manual techniques;Scar mobilization;Passive range of motion;Dry needling;Taping;Joint Manipulations;Orthotic Fit/Training    PT Next Visit Plan test  dynamic balance; test hip abd/ext, begin glute and core strengthening with table exercise and progress to standing as able, begin balance and gait training    Consulted and Agree with Plan of Care Patient           Patient will benefit from skilled therapeutic intervention in order to improve the following deficits and impairments:  Postural dysfunction,Pain,Decreased activity tolerance,Decreased strength,Impaired perceived functional ability,Improper body mechanics,Impaired sensation,Abnormal gait,Decreased balance,Decreased mobility  Visit Diagnosis: Low back pain, unspecified back pain laterality, unspecified chronicity, unspecified whether sciatica present  Muscle weakness (generalized)  Other abnormalities of gait and mobility  Other symptoms and signs involving the musculoskeletal system     Problem List Patient Active Problem List   Diagnosis Date Noted  . Myelopathy concurrent with and due to stenosis of lumbar spine ([place]) 02/25/2020  . Hypoalbuminemia due to protein-calorie malnutrition ([place])   . Muscle spasm   . Controlled type 2 diabetes mellitus with hyperglycemia, without long-term current use of insulin ([place])   . Essential hypertension   . Drug induced constipation   .  Postoperative pain   . Lumbar disc herniation with myelopathy 02/24/2020  . Left foot drop 02/14/2020  . Lumbar spinal stenosis 02/14/2020  . Lumbar radiculopathy 02/13/2020  . Acute right-sided back pain with sciatica 10/02/2019  . S/P total knee replacement, right 07/22/19  09/01/2019  . Primary localized osteoarthritis of left knee 07/22/2019  . Primary osteoarthritis of right knee   . Constipation 09/05/2018  . Abdominal pain, chronic, epigastric 05/27/2018  . Dysphagia 05/27/2018  . Depression, major, single episode, moderate ([place]) 03/22/2018  . S/P complete hysterectomy 06/06/2017  . Derangement of posterior horn of medial meniscus of right knee   . Right knee pain 09/10/2015  . Iron  deficiency anemia 10/16/2013  . Fibroids 08/21/2013  . Excessive or frequent menstruation 06/19/2013  . Morbid obesity ([place]) 07/12/2012  . Obstructive sleep apnea 06/21/2012  . Chest pain   . Hyperlipidemia   . Type 2 diabetes mellitus ([place])   . Obesity   . Laboratory test   . Nephrolithiasis   . Asthma   . Endometriosis   . Hypertension   . Iron deficiency anemia 05/11/2009  . GERD 05/11/2009    4:31 PM, 03/17/20 Mearl Latin PT, DPT Physical Therapist at [place], [place], [address] Phone: [phone]   Fax:  [phone]  Name: Carrie Cook MRN: [id number] Date of Birth: 1961-01-03

## 2020-03-19 ENCOUNTER — Encounter (HOSPITAL_COMMUNITY): Payer: Self-pay

## 2020-03-19 ENCOUNTER — Ambulatory Visit (HOSPITAL_COMMUNITY): Payer: Medicare Other

## 2020-03-19 ENCOUNTER — Other Ambulatory Visit: Payer: Self-pay

## 2020-03-19 DIAGNOSIS — M6281 Muscle weakness (generalized): Secondary | ICD-10-CM

## 2020-03-19 DIAGNOSIS — R29898 Other symptoms and signs involving the musculoskeletal system: Secondary | ICD-10-CM

## 2020-03-19 DIAGNOSIS — M545 Low back pain, unspecified: Secondary | ICD-10-CM | POA: Diagnosis not present

## 2020-03-19 DIAGNOSIS — R2689 Other abnormalities of gait and mobility: Secondary | ICD-10-CM

## 2020-03-19 NOTE — Therapy (Signed)
[place], [place], [address] Phone: [phone]   Fax:  [phone]  Physical Therapy Treatment  Patient Details  Name: Carrie Cook MRN: [id number] Date of Birth: 1960-04-14 Referring Provider (PT): Ankit Lorie Phenix MD   Encounter Date: 03/19/2020   PT End of Session - 03/19/20 0955    Visit Number 2    Number of Visits 12    Date for PT Re-Evaluation 04/28/20    Authorization Type Medicare (add KX)    Authorization Time Period 08/11/19 to 09/26/19    Progress Note Due on Visit 10    PT Start Time 0916    PT Stop Time 0956    PT Time Calculation (min) 40 min    Activity Tolerance Patient tolerated treatment well    Behavior During Therapy [facility] for tasks assessed/performed           Past Medical History:  Diagnosis Date  . Anemia    PT HAS HAD COLONOSCOPY AND ENDOSCOPY WORK UP - NO PROBLEMS FOUND AS SOURCE OF ANEMIA -- PT HAD IRON INFUSION AT [facility] 11/13/12-AFTER SEEING HEMATOLOGIST DR. Yoncalla AND HE GAVE HEMATOLOGIC CLEARANCE FOR GASTRIC BYPASS SURGERY.  Marland Kitchen Anxiety   . Asthma    daily and prn inhalers  . Degenerative joint disease    right knee, spine - STATES INTERMITTENT  NUMBNESS DOWN RT LEG WITH PROLONGED STANDING OR WALKING- THINKS RELATED TO HER SPINE PROBLEMS  . Dental crowns present   . Depression   . Family history of anesthesia complication    pt's mother and sister have hx. of post-op N/V  . Fibroids    UTERINE  . Gastroesophageal reflux disease    none for 2 years since Bariatric surgery.  . H/O hiatal hernia   . History of endometriosis   . History of kidney stones   . Hyperlipidemia   . Hypertension    under control with med., has been on med. x 2 yr.  . Insulin dependent diabetes mellitus   . Lock jaw    jaw locks open if opens mouth wide  . Migraines   . Neuropathy    FEET  . Obesity   . Palpitations   . PONV (postoperative nausea and vomiting)   . Shortness of  breath    with daily activities  . Sleep apnea    post-op didn't need CPAP but now states needs it again but not using    Past Surgical History:  Procedure Laterality Date  . ABDOMINAL HYSTERECTOMY N/A 06/06/2017   Procedure: HYSTERECTOMY ABDOMINAL;  Surgeon: Florian Buff, MD;  Location: [facility];  Service: Gynecology;  Laterality: N/A;  . BACK SURGERY    . BREATH TEK H PYLORI N/A 08/13/2012   Procedure: BREATH TEK H PYLORI;  Surgeon: Edward Jolly, MD;  Location: [facility];  Service: General;  Laterality: N/A;  . CARPAL TUNNEL RELEASE  01/03/2012   Procedure: CARPAL TUNNEL RELEASE;  Surgeon: Wynonia Sours, MD;  Location: [facility];  Service: Orthopedics;  Laterality: Right;  . carpal tunnel release right hand Right 12/13  . COLONOSCOPY  05/2009   WLS:[id number] hemorrhoids otherwise normal colon, rectum and terminal ileum  . COLONOSCOPY WITH ESOPHAGOGASTRODUODENOSCOPY (EGD) N/A 10/28/2012   Procedure: COLONOSCOPY WITH ESOPHAGOGASTRODUODENOSCOPY (EGD);  Surgeon: Daneil Dolin, MD;  [facility];  Service: Endoscopy;  Laterality: N/A;  7:30  . DILITATION & CURRETTAGE/HYSTROSCOPY WITH NOVASURE ABLATION N/A 07/22/2014  Procedure: DILATATION & CURETTAGE/HYSTEROSCOPY WITH NOVASURE ABLATION; uterine length 6.0 cm; uterine width 3.8 cm; total ablation time 1 minute 15 seconds;  Surgeon: Florian Buff, MD;  Location: [facility];  Service: Gynecology;  Laterality: N/A;  . ESOPHAGOGASTRODUODENOSCOPY  5/013/2011   RUE:AVWUJW esophagus/multiple polyps removed s/p (hyperplastic). Gastritis without H.pylori.  . ESOPHAGOGASTRODUODENOSCOPY (EGD) WITH PROPOFOL N/A 06/06/2018   with LA Grade A esophagitis s/p dilation.   Marland Kitchen GASTRIC ROUX-EN-Y N/A 11/19/2012   Procedure: LAPAROSCOPIC ROUX-EN-Y GASTRIC;  Surgeon: Edward Jolly, MD;  Location: [facility];  Service: General;  Laterality: N/A;  . GIVENS CAPSULE STUDY N/A 10/28/2012   Procedure: GIVENS CAPSULE STUDY;  Surgeon: Daneil Dolin, MD;  [facility];  Service: Endoscopy;  Laterality: N/A;  . KNEE ARTHROSCOPY WITH MEDIAL MENISECTOMY Right 10/28/2015   Procedure: RIGHT KNEE ARTHROSCOPY WITH MEDIAL MENISECTOMY;  Surgeon: Carole Civil, MD;  Location: [facility];  Service: Orthopedics;  Laterality: Right;  . LAPAROSCOPY     due to endometriosis  . LUMBAR LAMINECTOMY/DECOMPRESSION MICRODISCECTOMY N/A 02/14/2020   Procedure: LUMBAR LAMINECTOMY  Lumbar three-four, Lumbar four-five;  Surgeon: Consuella Lose, MD;  Location: [facility];  Service: Neurosurgery;  Laterality: N/A;  . Venia Minks DILATION N/A 06/06/2018   Procedure: Venia Minks DILATION;  Surgeon: Daneil Dolin, MD;  [facility];  Service: Endoscopy;  Laterality: N/A;  . PELVIC LAPAROSCOPY  11/04/1999   with fulguration of endometriosis  . SALPINGOOPHORECTOMY Bilateral 06/06/2017   Procedure: SALPINGO OOPHORECTOMY;  Surgeon: Florian Buff, MD;  Location: [facility];  Service: Gynecology;  Laterality: Bilateral;  . TOTAL KNEE ARTHROPLASTY Right 07/22/2019   Procedure: TOTAL KNEE ARTHROPLASTY;  Surgeon: Carole Civil, MD;  Location: [facility];  Service: Orthopedics;  Laterality: Right;  . TRIGGER FINGER RELEASE Right 09/24/2012   Procedure: RELEASE A-1 PULLEY OF RIGHT THUMB;  Surgeon: Wynonia Sours, MD;  Location: [facility];  Service: Orthopedics;  Laterality: Right;  . TUBAL LIGATION  1993    There were no vitals filed for this visit.   Subjective Assessment - 03/19/20 0918    Subjective Pt stated she has some soreness Rt side LBP, pain scale 3/10 constant pain.    Pertinent History RT TKA    Patient Stated Goals improve strength and balance    Currently in Pain? Yes    Pain Score 3     Pain Location Back    Pain Orientation Lower;Right    Pain Descriptors / Indicators Sore    Pain Type Surgical pain    Pain Radiating Towards numbness and tingling Rt twists around to anterior thigh and feet, Lt to foot    Pain Onset More than a  month ago    Pain Frequency Intermittent    Aggravating Factors  sit, driving, stand    Pain Relieving Factors meds    Effect of Pain on Daily Activities limits              [facility] PT Assessment - 03/19/20 0001      Assessment   Medical Diagnosis Myelopathy lumbar region    Referring Provider (PT) Ankit Lorie Phenix MD    Onset Date/Surgical Date 02/14/20    Next MD Visit Next week    Prior Therapy yes back      Strength   Right Hip Extension 3+/5    Right Hip ABduction 3+/5    Left Hip Extension 4-/5    Left Hip ABduction 4-/5  Standardized Balance Assessment   Standardized Balance Assessment Dynamic Gait Index      Dynamic Gait Index   Level Surface Normal    Change in Gait Speed Normal    Gait with Horizontal Head Turns Normal    Gait with Vertical Head Turns Normal    Gait and Pivot Turn Normal    Step Over Obstacle Mild Impairment    Step Around Obstacles Mild Impairment    Steps Mild Impairment   2 HR, reciprocal pattern ascending, step to descending   Total Score 21                         [facility] Adult PT Treatment/Exercise - 03/19/20 0001      Lumbar Exercises: Standing   Other Standing Lumbar Exercises SLS Rt 16", Lt 4" max of 3    Other Standing Lumbar Exercises ability to hold 30" no HHA tandem stance      Lumbar Exercises: Supine   AB Set Limitations 21min ab sets 5" holds paired with exhalation    Bridge 10 reps;3 seconds    Bridge Limitations wiht ab set    Other Supine Lumbar Exercises Deep breathing x 87min      Lumbar Exercises: Sidelying   Clam Both;10 reps;5 seconds    Clam Limitations RTB around thigh                  PT Education - 03/19/20 0954    Education Details Reviewed goals, educated importance of HEP compliance that was established this session.  MMT, DGI and static balance tested and discussed results.    Person(s) Educated Patient    Methods Explanation;Demonstration    Comprehension Verbalized  understanding;Returned demonstration            PT Short Term Goals - 03/17/20 1627      PT SHORT TERM GOAL #1   Title Patient will be independent with initial HEP and self-management strategies to improve functional outcomes    Time 3    Period Weeks    Status New    Target Date 04/07/20      PT SHORT TERM GOAL #2   Title Patient will report at least 25% improvement in symptoms for improved quality of life.    Time 3    Period Weeks    Status New    Target Date 04/07/20             PT Long Term Goals - 03/17/20 1628      PT LONG TERM GOAL #1   Title Patient will report at least 75% improvement in symptoms for improved quality of life.    Time 6    Period Weeks    Status New    Target Date 04/28/20      PT LONG TERM GOAL #2   Title Patient will improve FOTO score by at least 10 points in order to indicate improved tolerance to activity.    Time 6    Period Weeks    Status New    Target Date 04/28/20      PT LONG TERM GOAL #3   Title Patient will be able to complete 5x STS in under 11.4 seconds in order to reduce the risk of falls.    Time 6    Period Weeks    Status New    Target Date 04/28/20      PT LONG TERM GOAL #4   Title  Patient will be able to ambulate at least 450 feet in 2MWT in order to demonstrate improved gait speed for community ambulation.    Time 6    Period Weeks    Status New    Target Date 04/28/20                 Plan - 03/19/20 1228    Clinical Impression Statement Reviewed goals, educated importance of HEP compliance for maximal benefits that was established this session.  MMT complete for gluteal mm as well as DGI and static balance activities.  Pt presents wiht weak gluteal mm.  THerex focus on gluteal and core strengthening.  Pt able to demonstrate appropriate mechancis following initial cueing for form and mechanics.  Educated importance of breathing through out exercises.  No reports of increased pain, was limited by some  fatigue following activities.    Personal Factors and Comorbidities Comorbidity 3+;Time since onset of injury/illness/exacerbation;Past/Current Experience;Fitness    Comorbidities Anxiety, Arthritis, Asthma, Back pain, BMI over 30,  Depression, Diabetes, High Blood Pressure, Prior Surgery    Examination-Activity Limitations Locomotion Level;Transfers;Squat;Bend;Sleep;Sit;Bed Mobility    Examination-Participation Restrictions Community Activity;Yard Work;Laundry;Cleaning;Shop;Volunteer    Stability/Clinical Decision Making Stable/Uncomplicated    Clinical Decision Making Low    Rehab Potential Fair    PT Frequency 2x / week    PT Duration 6 weeks    PT Treatment/Interventions ADLs/Self Care Home Management;Aquatic Therapy;Biofeedback;Cryotherapy;Electrical Stimulation;Moist Heat;DME Instruction;Gait training;Stair training;Functional mobility training;Therapeutic activities;Therapeutic exercise;Balance training;Neuromuscular re-education;Patient/family education;Manual techniques;Scar mobilization;Passive range of motion;Dry needling;Taping;Joint Manipulations;Orthotic Fit/Training    PT Next Visit Plan Glute and core strengthening with table exercise and progress to standing as able, begin balance and gait training    PT Home Exercise Plan 03/19/20: ab set, bridge, clam with RTB    Consulted and Agree with Plan of Care Patient           Patient will benefit from skilled therapeutic intervention in order to improve the following deficits and impairments:  Postural dysfunction,Pain,Decreased activity tolerance,Decreased strength,Impaired perceived functional ability,Improper body mechanics,Impaired sensation,Abnormal gait,Decreased balance,Decreased mobility  Visit Diagnosis: Low back pain, unspecified back pain laterality, unspecified chronicity, unspecified whether sciatica present  Muscle weakness (generalized)  Other abnormalities of gait and mobility  Other symptoms and signs involving  the musculoskeletal system     Problem List Patient Active Problem List   Diagnosis Date Noted  . Myelopathy concurrent with and due to stenosis of lumbar spine ([place]) 02/25/2020  . Hypoalbuminemia due to protein-calorie malnutrition ([place])   . Muscle spasm   . Controlled type 2 diabetes mellitus with hyperglycemia, without long-term current use of insulin ([place])   . Essential hypertension   . Drug induced constipation   . Postoperative pain   . Lumbar disc herniation with myelopathy 02/24/2020  . Left foot drop 02/14/2020  . Lumbar spinal stenosis 02/14/2020  . Lumbar radiculopathy 02/13/2020  . Acute right-sided back pain with sciatica 10/02/2019  . S/P total knee replacement, right 07/22/19  09/01/2019  . Primary localized osteoarthritis of left knee 07/22/2019  . Primary osteoarthritis of right knee   . Constipation 09/05/2018  . Abdominal pain, chronic, epigastric 05/27/2018  . Dysphagia 05/27/2018  . Depression, major, single episode, moderate ([place]) 03/22/2018  . S/P complete hysterectomy 06/06/2017  . Derangement of posterior horn of medial meniscus of right knee   . Right knee pain 09/10/2015  . Iron deficiency anemia 10/16/2013  . Fibroids 08/21/2013  . Excessive or frequent menstruation 06/19/2013  . Morbid obesity ([place])  07/12/2012  . Obstructive sleep apnea 06/21/2012  . Chest pain   . Hyperlipidemia   . Type 2 diabetes mellitus ([place])   . Obesity   . Laboratory test   . Nephrolithiasis   . Asthma   . Endometriosis   . Hypertension   . Iron deficiency anemia 05/11/2009  . GERD 05/11/2009   Ihor Austin, LPTA/CLT; CBIS [phone]  Aldona Lento 03/19/2020, 12:32 PM  [place] [place] [address] [place], [place], [address] Phone: [phone]   Fax:  [phone]  Name: Carrie Cook MRN: [id number] Date of Birth: [birth year]

## 2020-03-19 NOTE — Patient Instructions (Addendum)
Isometric Abdominal    Lying on back with knees bent, tighten stomach by pressing elbows down.   Breath in through nose swelling belly with air, exhale as you tighten belly muscules.  Hold 5 seconds. Repeat 10 times per set. Do 2 sets per day.  http://orth.exer.us/1087   Copyright  VHI. All rights reserved.   Bridge    Lie back, legs bent. Inhale, pressing hips up. Keeping ribs in, lengthen lower back. Exhale, rolling down along spine from top. Repeat ____ times. Do ____ sessions per day.  http://pm.exer.us/55   Copyright  VHI. All rights reserved.   Abduction: Clam (Eccentric) - Side-Lying    Lie on side with knees bent. Lift top knee, keeping feet together. Keep trunk steady. Slowly lower for 3-5 seconds. 10 reps per set, 1-2 sets per day, 4 days per week.  http://ecce.exer.us/65   Copyright  VHI. All rights reserved.

## 2020-03-23 ENCOUNTER — Encounter (HOSPITAL_COMMUNITY): Payer: Self-pay | Admitting: Physical Therapy

## 2020-03-23 ENCOUNTER — Other Ambulatory Visit: Payer: Self-pay

## 2020-03-23 ENCOUNTER — Ambulatory Visit (HOSPITAL_COMMUNITY): Payer: Medicare Other | Attending: Physical Medicine & Rehabilitation | Admitting: Physical Therapy

## 2020-03-23 DIAGNOSIS — R29898 Other symptoms and signs involving the musculoskeletal system: Secondary | ICD-10-CM | POA: Diagnosis not present

## 2020-03-23 DIAGNOSIS — M6281 Muscle weakness (generalized): Secondary | ICD-10-CM | POA: Diagnosis not present

## 2020-03-23 DIAGNOSIS — R2689 Other abnormalities of gait and mobility: Secondary | ICD-10-CM | POA: Insufficient documentation

## 2020-03-23 DIAGNOSIS — M545 Low back pain, unspecified: Secondary | ICD-10-CM | POA: Insufficient documentation

## 2020-03-23 NOTE — Patient Instructions (Signed)
Hamstring Stretch (Sitting)    Sitting, extend one leg and place hands on same thigh for support. Keeping torso straight, lean forward, sliding hands down leg, until a stretch is felt in back of thigh.   Hold _30___ seconds. Repeat with other leg. Do _3__ sets per session. Do _2___ sessions per day.   Knee-to-Chest Stretch: Unilateral    With hand behind right knee, pull knee in to chest until a comfortable stretch is felt in lower back and buttocks. Keep back relaxed. Hold _30___ seconds. Do _3__ sets per session. Do _2___ sessions per day.

## 2020-03-23 NOTE — Therapy (Signed)
[place], [place], [address] Phone: [phone]   Fax:  [phone]  Physical Therapy Treatment  Patient Details  Name: Carrie Cook MRN: [id number] Date of Birth: 21-Oct-1960 Referring Provider (PT): Ankit Lorie Phenix MD   Encounter Date: 03/23/2020   PT End of Session - 03/23/20 1236    Visit Number 3    Number of Visits 12    Date for PT Re-Evaluation 04/28/20    Authorization Type Medicare (add KX)    Authorization Time Period 08/11/19 to 09/26/19    Progress Note Due on Visit 10    PT Start Time 0835    PT Stop Time 0915    PT Time Calculation (min) 40 min    Activity Tolerance Patient tolerated treatment well    Behavior During Therapy [facility] for tasks assessed/performed           Past Medical History:  Diagnosis Date  . Anemia    PT HAS HAD COLONOSCOPY AND ENDOSCOPY WORK UP - NO PROBLEMS FOUND AS SOURCE OF ANEMIA -- PT HAD IRON INFUSION AT [facility] 11/13/12-AFTER SEEING HEMATOLOGIST DR. [place] AND HE GAVE HEMATOLOGIC CLEARANCE FOR GASTRIC BYPASS SURGERY.  Marland Kitchen Anxiety   . Asthma    daily and prn inhalers  . Degenerative joint disease    right knee, spine - STATES INTERMITTENT  NUMBNESS DOWN RT LEG WITH PROLONGED STANDING OR WALKING- THINKS RELATED TO HER SPINE PROBLEMS  . Dental crowns present   . Depression   . Family history of anesthesia complication    pt's mother and sister have hx. of post-op N/V  . Fibroids    UTERINE  . Gastroesophageal reflux disease    none for 2 years since Bariatric surgery.  . H/O hiatal hernia   . History of endometriosis   . History of kidney stones   . Hyperlipidemia   . Hypertension    under control with med., has been on med. x 2 yr.  . Insulin dependent diabetes mellitus   . Lock jaw    jaw locks open if opens mouth wide  . Migraines   . Neuropathy    FEET  . Obesity   . Palpitations   . PONV (postoperative nausea and vomiting)   . Shortness of breath     with daily activities  . Sleep apnea    post-op didn't need CPAP but now states needs it again but not using    Past Surgical History:  Procedure Laterality Date  . ABDOMINAL HYSTERECTOMY N/A 06/06/2017   Procedure: HYSTERECTOMY ABDOMINAL;  Surgeon: Florian Buff, MD;  [facility];  Service: Gynecology;  Laterality: N/A;  . BACK SURGERY    . BREATH TEK H PYLORI N/A 08/13/2012   Procedure: BREATH TEK H PYLORI;  Surgeon: Edward Jolly, MD;  Location: [facility];  Service: General;  Laterality: N/A;  . CARPAL TUNNEL RELEASE  01/03/2012   Procedure: CARPAL TUNNEL RELEASE;  Surgeon: Wynonia Sours, MD;  Location: [facility];  Service: Orthopedics;  Laterality: Right;  . carpal tunnel release right hand Right 12/13  . COLONOSCOPY  05/2009   [id number] hemorrhoids otherwise normal colon, rectum and terminal ileum  . COLONOSCOPY WITH ESOPHAGOGASTRODUODENOSCOPY (EGD) N/A 10/28/2012   Procedure: COLONOSCOPY WITH ESOPHAGOGASTRODUODENOSCOPY (EGD);  Surgeon: Daneil Dolin, MD;  [facility];  Service: Endoscopy;  Laterality: N/A;  7:30  . DILITATION & CURRETTAGE/HYSTROSCOPY WITH NOVASURE ABLATION N/A 07/22/2014  Procedure: DILATATION & CURETTAGE/HYSTEROSCOPY WITH NOVASURE ABLATION; uterine length 6.0 cm; uterine width 3.8 cm; total ablation time 1 minute 15 seconds;  Surgeon: Florian Buff, MD;  [facility];  Service: Gynecology;  Laterality: N/A;  . ESOPHAGOGASTRODUODENOSCOPY  5/013/2011   [id number] esophagus/multiple polyps removed s/p (hyperplastic). Gastritis without H.pylori.  . ESOPHAGOGASTRODUODENOSCOPY (EGD) WITH PROPOFOL N/A 06/06/2018   with LA Grade A esophagitis s/p dilation.   Marland Kitchen GASTRIC ROUX-EN-Y N/A 11/19/2012   Procedure: LAPAROSCOPIC ROUX-EN-Y GASTRIC;  Surgeon: Edward Jolly, MD;  [facility];  Service: General;  Laterality: N/A;  . GIVENS CAPSULE STUDY N/A 10/28/2012   Procedure: GIVENS CAPSULE STUDY;  Surgeon: Daneil Dolin,  MD;  [facility];  Service: Endoscopy;  Laterality: N/A;  . KNEE ARTHROSCOPY WITH MEDIAL MENISECTOMY Right 10/28/2015   Procedure: RIGHT KNEE ARTHROSCOPY WITH MEDIAL MENISECTOMY;  Surgeon: Carole Civil, MD;  [facility];  Service: Orthopedics;  Laterality: Right;  . LAPAROSCOPY     due to endometriosis  . LUMBAR LAMINECTOMY/DECOMPRESSION MICRODISCECTOMY N/A 02/14/2020   Procedure: LUMBAR LAMINECTOMY  Lumbar three-four, Lumbar four-five;  Surgeon: Consuella Lose, MD;  Location: [facility];  Service: Neurosurgery;  Laterality: N/A;  . Venia Minks DILATION N/A 06/06/2018   Procedure: Venia Minks DILATION;  Surgeon: Daneil Dolin, MD;  [facility];  Service: Endoscopy;  Laterality: N/A;  . PELVIC LAPAROSCOPY  11/04/1999   with fulguration of endometriosis  . SALPINGOOPHORECTOMY Bilateral 06/06/2017   Procedure: SALPINGO OOPHORECTOMY;  Surgeon: Florian Buff, MD;  [facility];  Service: Gynecology;  Laterality: Bilateral;  . TOTAL KNEE ARTHROPLASTY Right 07/22/2019   Procedure: TOTAL KNEE ARTHROPLASTY;  Surgeon: Carole Civil, MD;  [facility];  Service: Orthopedics;  Laterality: Right;  . TRIGGER FINGER RELEASE Right 09/24/2012   Procedure: RELEASE A-1 PULLEY OF RIGHT THUMB;  Surgeon: Wynonia Sours, MD;  Location: [facility];  Service: Orthopedics;  Laterality: Right;  . TUBAL LIGATION  1993    There were no vitals filed for this visit.   Subjective Assessment - 03/23/20 0849    Subjective Pt states she has no pain just some soreness from the new exercises.    Currently in Pain? No/denies                             [facility] Adult PT Treatment/Exercise - 03/23/20 0001      Lumbar Exercises: Stretches   Active Hamstring Stretch Right;Left      Lumbar Exercises: Supine   AB Set Limitations 22min ab sets 5" holds paired with exhalation    Bridge 10 reps;3 seconds    Bridge Limitations with ab set      Lumbar Exercises:  Sidelying   Clam Both;10 reps;5 seconds    Clam Limitations RTB around thigh                    PT Short Term Goals - 03/17/20 1627      PT SHORT TERM GOAL #1   Title Patient will be independent with initial HEP and self-management strategies to improve functional outcomes    Time 3    Period Weeks    Status New    Target Date 04/07/20      PT SHORT TERM GOAL #2   Title Patient will report at least 25% improvement in symptoms for improved quality of life.    Time 3  Period Weeks    Status New    Target Date 04/07/20             PT Long Term Goals - 03/17/20 1628      PT LONG TERM GOAL #1   Title Patient will report at least 75% improvement in symptoms for improved quality of life.    Time 6    Period Weeks    Status New    Target Date 04/28/20      PT LONG TERM GOAL #2   Title Patient will improve FOTO score by at least 10 points in order to indicate improved tolerance to activity.    Time 6    Period Weeks    Status New    Target Date 04/28/20      PT LONG TERM GOAL #3   Title Patient will be able to complete 5x STS in under 11.4 seconds in order to reduce the risk of falls.    Time 6    Period Weeks    Status New    Target Date 04/28/20      PT LONG TERM GOAL #4   Title Patient will be able to ambulate at least 450 feet in 2MWT in order to demonstrate improved gait speed for community ambulation.    Time 6    Period Weeks    Status New    Target Date 04/28/20                 Plan - 03/23/20 1237    Clinical Impression Statement continued with established POC.  Pt was sore this am so did not add any new exercises other than gentle stretch.  PT reported improvement at end of session in her soreness.    Personal Factors and Comorbidities Comorbidity 3+;Time since onset of injury/illness/exacerbation;Past/Current Experience;Fitness    Comorbidities Anxiety, Arthritis, Asthma, Back pain, BMI over 30,  Depression, Diabetes, High Blood  Pressure, Prior Surgery    Examination-Activity Limitations Locomotion Level;Transfers;Squat;Bend;Sleep;Sit;Bed Mobility    Examination-Participation Restrictions Community Activity;Yard Work;Laundry;Cleaning;Shop;Volunteer    Stability/Clinical Decision Making Stable/Uncomplicated    Rehab Potential Fair    PT Frequency 2x / week    PT Duration 6 weeks    PT Treatment/Interventions ADLs/Self Care Home Management;Aquatic Therapy;Biofeedback;Cryotherapy;Electrical Stimulation;Moist Heat;DME Instruction;Gait training;Stair training;Functional mobility training;Therapeutic activities;Therapeutic exercise;Balance training;Neuromuscular re-education;Patient/family education;Manual techniques;Scar mobilization;Passive range of motion;Dry needling;Taping;Joint Manipulations;Orthotic Fit/Training    PT Next Visit Plan Glute and core strengthening with table exercise and progress to standing as able, begin balance and gait training    PT Home Exercise Plan 03/19/20: ab set, bridge, clam with RTB    Consulted and Agree with Plan of Care Patient           Patient will benefit from skilled therapeutic intervention in order to improve the following deficits and impairments:  Postural dysfunction,Pain,Decreased activity tolerance,Decreased strength,Impaired perceived functional ability,Improper body mechanics,Impaired sensation,Abnormal gait,Decreased balance,Decreased mobility  Visit Diagnosis: Low back pain, unspecified back pain laterality, unspecified chronicity, unspecified whether sciatica present  Muscle weakness (generalized)  Other abnormalities of gait and mobility     Problem List Patient Active Problem List   Diagnosis Date Noted  . Myelopathy concurrent with and due to stenosis of lumbar spine ([place]) 02/25/2020  . Hypoalbuminemia due to protein-calorie malnutrition ([place])   . Muscle spasm   . Controlled type 2 diabetes mellitus with hyperglycemia, without long-term current use of  insulin ([place])   . Essential hypertension   . Drug induced constipation   .  Postoperative pain   . Lumbar disc herniation with myelopathy 02/24/2020  . Left foot drop 02/14/2020  . Lumbar spinal stenosis 02/14/2020  . Lumbar radiculopathy 02/13/2020  . Acute right-sided back pain with sciatica 10/02/2019  . S/P total knee replacement, right 07/22/19  09/01/2019  . Primary localized osteoarthritis of left knee 07/22/2019  . Primary osteoarthritis of right knee   . Constipation 09/05/2018  . Abdominal pain, chronic, epigastric 05/27/2018  . Dysphagia 05/27/2018  . Depression, major, single episode, moderate ([place]) 03/22/2018  . S/P complete hysterectomy 06/06/2017  . Derangement of posterior horn of medial meniscus of right knee   . Right knee pain 09/10/2015  . Iron deficiency anemia 10/16/2013  . Fibroids 08/21/2013  . Excessive or frequent menstruation 06/19/2013  . Morbid obesity ([place]) 07/12/2012  . Obstructive sleep apnea 06/21/2012  . Chest pain   . Hyperlipidemia   . Type 2 diabetes mellitus ([place])   . Obesity   . Laboratory test   . Nephrolithiasis   . Asthma   . Endometriosis   . Hypertension   . Iron deficiency anemia 05/11/2009  . GERD 05/11/2009   Teena Irani, PTA/CLT [phone]  Teena Irani 03/23/2020, 12:38 PM  [facility] [address] [place], [place], [address] Phone: [phone]   Fax:  [phone]  Name: GWYNNETH FABIO MRN: [id number] Date of Birth: 11/09/1960

## 2020-03-24 ENCOUNTER — Encounter (HOSPITAL_COMMUNITY): Payer: Self-pay | Admitting: Physical Therapy

## 2020-03-24 ENCOUNTER — Ambulatory Visit (HOSPITAL_COMMUNITY): Payer: Medicare Other | Admitting: Physical Therapy

## 2020-03-24 DIAGNOSIS — R2689 Other abnormalities of gait and mobility: Secondary | ICD-10-CM

## 2020-03-24 DIAGNOSIS — R29898 Other symptoms and signs involving the musculoskeletal system: Secondary | ICD-10-CM

## 2020-03-24 DIAGNOSIS — M6281 Muscle weakness (generalized): Secondary | ICD-10-CM

## 2020-03-24 DIAGNOSIS — M545 Low back pain, unspecified: Secondary | ICD-10-CM | POA: Diagnosis not present

## 2020-03-24 NOTE — Patient Instructions (Signed)
Access Code: [email or web address] URL: [email or web address] Date: 03/24/2020 Prepared by: Mitzi Hansen Travaris Kosh  Exercises Prone Hip Extension - 1 x daily - 7 x weekly - 2 sets - 10 reps Sidelying Hip Abduction - 1 x daily - 7 x weekly - 2 sets - 10 reps Standing Tandem Balance with Counter Support - 1 x daily - 7 x weekly - 3 reps - 30 second hold

## 2020-03-24 NOTE — Therapy (Signed)
[place], [place], [address] Phone: [phone]   Fax:  [phone]  Physical Therapy Treatment  Patient Details  Name: LUANNA WEESNER MRN: [id number] Date of Birth: 1960-07-21 Referring Provider (PT): Ankit Lorie Phenix MD   Encounter Date: 03/24/2020   PT End of Session - 03/24/20 0825    Visit Number 4    Number of Visits 12    Date for PT Re-Evaluation 04/28/20    Authorization Type Medicare (add KX)    Progress Note Due on Visit 10    PT Start Time 0827    PT Stop Time 0907    PT Time Calculation (min) 40 min    Activity Tolerance Patient tolerated treatment well    Behavior During Therapy [facility] for tasks assessed/performed           Past Medical History:  Diagnosis Date  . Anemia    PT HAS HAD COLONOSCOPY AND ENDOSCOPY WORK UP - NO PROBLEMS FOUND AS SOURCE OF ANEMIA -- PT HAD IRON INFUSION AT [facility] 11/13/12-AFTER SEEING HEMATOLOGIST DR. [place] AND HE GAVE HEMATOLOGIC CLEARANCE FOR GASTRIC BYPASS SURGERY.  Marland Kitchen Anxiety   . Asthma    daily and prn inhalers  . Degenerative joint disease    right knee, spine - STATES INTERMITTENT  NUMBNESS DOWN RT LEG WITH PROLONGED STANDING OR WALKING- THINKS RELATED TO HER SPINE PROBLEMS  . Dental crowns present   . Depression   . Family history of anesthesia complication    pt's mother and sister have hx. of post-op N/V  . Fibroids    UTERINE  . Gastroesophageal reflux disease    none for 2 years since Bariatric surgery.  . H/O hiatal hernia   . History of endometriosis   . History of kidney stones   . Hyperlipidemia   . Hypertension    under control with med., has been on med. x 2 yr.  . Insulin dependent diabetes mellitus   . Lock jaw    jaw locks open if opens mouth wide  . Migraines   . Neuropathy    FEET  . Obesity   . Palpitations   . PONV (postoperative nausea and vomiting)   . Shortness of breath    with daily activities  . Sleep apnea     post-op didn't need CPAP but now states needs it again but not using    Past Surgical History:  Procedure Laterality Date  . ABDOMINAL HYSTERECTOMY N/A 06/06/2017   Procedure: HYSTERECTOMY ABDOMINAL;  Surgeon: Florian Buff, MD;  [facility];  Service: Gynecology;  Laterality: N/A;  . BACK SURGERY    . BREATH TEK H PYLORI N/A 08/13/2012   Procedure: BREATH TEK H PYLORI;  Surgeon: Edward Jolly, MD;  Location: [facility];  Service: General;  Laterality: N/A;  . CARPAL TUNNEL RELEASE  01/03/2012   Procedure: CARPAL TUNNEL RELEASE;  Surgeon: Wynonia Sours, MD;  Location: [facility];  Service: Orthopedics;  Laterality: Right;  . carpal tunnel release right hand Right 12/13  . COLONOSCOPY  05/2009   [id number] hemorrhoids otherwise normal colon, rectum and terminal ileum  . COLONOSCOPY WITH ESOPHAGOGASTRODUODENOSCOPY (EGD) N/A 10/28/2012   Procedure: COLONOSCOPY WITH ESOPHAGOGASTRODUODENOSCOPY (EGD);  Surgeon: Daneil Dolin, MD;  [facility];  Service: Endoscopy;  Laterality: N/A;  7:30  . DILITATION & CURRETTAGE/HYSTROSCOPY WITH NOVASURE ABLATION N/A 07/22/2014   Procedure: DILATATION & CURETTAGE/HYSTEROSCOPY WITH NOVASURE ABLATION; uterine length  6.0 cm; uterine width 3.8 cm; total ablation time 1 minute 15 seconds;  Surgeon: Florian Buff, MD;  [facility];  Service: Gynecology;  Laterality: N/A;  . ESOPHAGOGASTRODUODENOSCOPY  5/013/2011   XTG:GYIRSW esophagus/multiple polyps removed s/p (hyperplastic). Gastritis without H.pylori.  . ESOPHAGOGASTRODUODENOSCOPY (EGD) WITH PROPOFOL N/A 06/06/2018   with LA Grade A esophagitis s/p dilation.   Marland Kitchen GASTRIC ROUX-EN-Y N/A 11/19/2012   Procedure: LAPAROSCOPIC ROUX-EN-Y GASTRIC;  Surgeon: Edward Jolly, MD;  [facility];  Service: General;  Laterality: N/A;  . GIVENS CAPSULE STUDY N/A 10/28/2012   Procedure: GIVENS CAPSULE STUDY;  Surgeon: Daneil Dolin, MD;  [facility];  Service:  Endoscopy;  Laterality: N/A;  . KNEE ARTHROSCOPY WITH MEDIAL MENISECTOMY Right 10/28/2015   Procedure: RIGHT KNEE ARTHROSCOPY WITH MEDIAL MENISECTOMY;  Surgeon: Carole Civil, MD;  [facility];  Service: Orthopedics;  Laterality: Right;  . LAPAROSCOPY     due to endometriosis  . LUMBAR LAMINECTOMY/DECOMPRESSION MICRODISCECTOMY N/A 02/14/2020   Procedure: LUMBAR LAMINECTOMY  Lumbar three-four, Lumbar four-five;  Surgeon: Consuella Lose, MD;  Location: [facility];  Service: Neurosurgery;  Laterality: N/A;  . Venia Minks DILATION N/A 06/06/2018   Procedure: Venia Minks DILATION;  Surgeon: Daneil Dolin, MD;  [facility];  Service: Endoscopy;  Laterality: N/A;  . PELVIC LAPAROSCOPY  11/04/1999   with fulguration of endometriosis  . SALPINGOOPHORECTOMY Bilateral 06/06/2017   Procedure: SALPINGO OOPHORECTOMY;  Surgeon: Florian Buff, MD;  [facility];  Service: Gynecology;  Laterality: Bilateral;  . TOTAL KNEE ARTHROPLASTY Right 07/22/2019   Procedure: TOTAL KNEE ARTHROPLASTY;  Surgeon: Carole Civil, MD;  [facility];  Service: Orthopedics;  Laterality: Right;  . TRIGGER FINGER RELEASE Right 09/24/2012   Procedure: RELEASE A-1 PULLEY OF RIGHT THUMB;  Surgeon: Wynonia Sours, MD;  Location: [facility];  Service: Orthopedics;  Laterality: Right;  . TUBAL LIGATION  1993    There were no vitals filed for this visit.   Subjective Assessment - 03/24/20 0827    Subjective Patient states she has been using her cane some but she is slower with it. She is really sore from the new exercises and the one that makes her most sore is the clams.    Currently in Pain? Yes    Pain Score 3     Pain Location Back    Pain Orientation Lower;Right    Pain Descriptors / Indicators Burning    Pain Type Surgical pain    Pain Onset More than a month ago    Pain Frequency Constant                             [facility] Adult PT Treatment/Exercise - 03/24/20 0001       Lumbar Exercises: Stretches   Active Hamstring Stretch Right;Left;3 reps;30 seconds    Single Knee to Chest Stretch Right;Left;2 reps;20 seconds      Lumbar Exercises: Standing   Heel Raises 15 reps    Other Standing Lumbar Exercises tandem stance 2x 30 second holds      Lumbar Exercises: Supine   Ab Set 10 reps;5 seconds    Bent Knee Raise 10 reps    Bent Knee Raise Limitations 2 sets with ab set    Bridge 10 reps;3 seconds    Bridge Limitations with ab set      Lumbar Exercises: Sidelying   Hip Abduction Both;10 reps  Lumbar Exercises: Prone   Straight Leg Raise 10 reps;2 seconds                  PT Education - 03/24/20 0827    Education Details Patient educated on HEP, exercise mechanics, increasing walking    Person(s) Educated Patient    Methods Explanation;Demonstration;Handout    Comprehension Verbalized understanding;Returned demonstration            PT Short Term Goals - 03/17/20 1627      PT SHORT TERM GOAL #1   Title Patient will be independent with initial HEP and self-management strategies to improve functional outcomes    Time 3    Period Weeks    Status New    Target Date 04/07/20      PT SHORT TERM GOAL #2   Title Patient will report at least 25% improvement in symptoms for improved quality of life.    Time 3    Period Weeks    Status New    Target Date 04/07/20             PT Long Term Goals - 03/17/20 1628      PT LONG TERM GOAL #1   Title Patient will report at least 75% improvement in symptoms for improved quality of life.    Time 6    Period Weeks    Status New    Target Date 04/28/20      PT LONG TERM GOAL #2   Title Patient will improve FOTO score by at least 10 points in order to indicate improved tolerance to activity.    Time 6    Period Weeks    Status New    Target Date 04/28/20      PT LONG TERM GOAL #3   Title Patient will be able to complete 5x STS in under 11.4 seconds in order to reduce the risk  of falls.    Time 6    Period Weeks    Status New    Target Date 04/28/20      PT LONG TERM GOAL #4   Title Patient will be able to ambulate at least 450 feet in 2MWT in order to demonstrate improved gait speed for community ambulation.    Time 6    Period Weeks    Status New    Target Date 04/28/20                 Plan - 03/24/20 0826    Clinical Impression Statement Began session with stretches as patient was sore from exercises completed yesterday. Patient given frequent cueing for core activation with exercises with fair carry over. Added additional hip strengthening today and educated patient on possible soreness. Patient with minimal sway with tandem balance but no loss of balance or use of hands throughout. Patient will continue to benefit from skilled physical therapy in order to reduce impairment and improve function.    Personal Factors and Comorbidities Comorbidity 3+;Time since onset of injury/illness/exacerbation;Past/Current Experience;Fitness    Comorbidities Anxiety, Arthritis, Asthma, Back pain, BMI over 30,  Depression, Diabetes, High Blood Pressure, Prior Surgery    Examination-Activity Limitations Locomotion Level;Transfers;Squat;Bend;Sleep;Sit;Bed Mobility    Examination-Participation Restrictions Community Activity;Yard Work;Laundry;Cleaning;Shop;Volunteer    Stability/Clinical Decision Making Stable/Uncomplicated    Rehab Potential Fair    PT Frequency 2x / week    PT Duration 6 weeks    PT Treatment/Interventions ADLs/Self Care Home Management;Aquatic Therapy;Biofeedback;Cryotherapy;Electrical Stimulation;Moist Heat;DME Instruction;Gait training;Stair training;Functional mobility training;Therapeutic activities;Therapeutic  exercise;Balance training;Neuromuscular re-education;Patient/family education;Manual techniques;Scar mobilization;Passive range of motion;Dry needling;Taping;Joint Manipulations;Orthotic Fit/Training    PT Next Visit Plan Glute and core  strengthening with table exercise and progress to standing as able, begin balance and gait training    PT Home Exercise Plan 03/19/20: ab set, bridge, clam with RTB 3/2 hip abduction and extension, tandem stance    Consulted and Agree with Plan of Care Patient           Patient will benefit from skilled therapeutic intervention in order to improve the following deficits and impairments:  Postural dysfunction,Pain,Decreased activity tolerance,Decreased strength,Impaired perceived functional ability,Improper body mechanics,Impaired sensation,Abnormal gait,Decreased balance,Decreased mobility  Visit Diagnosis: Low back pain, unspecified back pain laterality, unspecified chronicity, unspecified whether sciatica present  Muscle weakness (generalized)  Other abnormalities of gait and mobility  Other symptoms and signs involving the musculoskeletal system     Problem List Patient Active Problem List   Diagnosis Date Noted  . Myelopathy concurrent with and due to stenosis of lumbar spine ([place]) 02/25/2020  . Hypoalbuminemia due to protein-calorie malnutrition ([place])   . Muscle spasm   . Controlled type 2 diabetes mellitus with hyperglycemia, without long-term current use of insulin ([place])   . Essential hypertension   . Drug induced constipation   . Postoperative pain   . Lumbar disc herniation with myelopathy 02/24/2020  . Left foot drop 02/14/2020  . Lumbar spinal stenosis 02/14/2020  . Lumbar radiculopathy 02/13/2020  . Acute right-sided back pain with sciatica 10/02/2019  . S/P total knee replacement, right 07/22/19  09/01/2019  . Primary localized osteoarthritis of left knee 07/22/2019  . Primary osteoarthritis of right knee   . Constipation 09/05/2018  . Abdominal pain, chronic, epigastric 05/27/2018  . Dysphagia 05/27/2018  . Depression, major, single episode, moderate ([place]) 03/22/2018  . S/P complete hysterectomy 06/06/2017  . Derangement of posterior horn of medial meniscus of  right knee   . Right knee pain 09/10/2015  . Iron deficiency anemia 10/16/2013  . Fibroids 08/21/2013  . Excessive or frequent menstruation 06/19/2013  . Morbid obesity ([place]) 07/12/2012  . Obstructive sleep apnea 06/21/2012  . Chest pain   . Hyperlipidemia   . Type 2 diabetes mellitus ([place])   . Obesity   . Laboratory test   . Nephrolithiasis   . Asthma   . Endometriosis   . Hypertension   . Iron deficiency anemia 05/11/2009  . GERD 05/11/2009   9:10 AM, 03/24/20 Mearl Latin PT, DPT Physical Therapist at [facility] [place], [place], [address] Phone: [phone]   Fax:  [phone]  Name: LAVENA LORETTO MRN: [id number] Date of Birth: 1960/03/09

## 2020-03-29 ENCOUNTER — Ambulatory Visit (HOSPITAL_COMMUNITY): Payer: Medicare Other | Admitting: Physical Therapy

## 2020-03-29 ENCOUNTER — Other Ambulatory Visit: Payer: Self-pay

## 2020-03-29 ENCOUNTER — Encounter (HOSPITAL_COMMUNITY): Payer: Self-pay | Admitting: Physical Therapy

## 2020-03-29 DIAGNOSIS — M545 Low back pain, unspecified: Secondary | ICD-10-CM | POA: Diagnosis not present

## 2020-03-29 DIAGNOSIS — R2689 Other abnormalities of gait and mobility: Secondary | ICD-10-CM

## 2020-03-29 DIAGNOSIS — R29898 Other symptoms and signs involving the musculoskeletal system: Secondary | ICD-10-CM

## 2020-03-29 DIAGNOSIS — M6281 Muscle weakness (generalized): Secondary | ICD-10-CM

## 2020-03-29 NOTE — Therapy (Signed)
Carrie [place], [place], [address] Phone: [phone]   Fax:  [phone]  Physical Therapy Treatment  Patient Details  Name: Carrie Cook MRN: [id number] Date of Birth: November 07, 1960 Referring Provider (PT): Ankit Lorie Phenix MD   Encounter Date: 03/29/2020   PT End of Session - 03/29/20 0828    Visit Number 5    Number of Visits 12    Date for PT Re-Evaluation 04/28/20    Authorization Type Medicare (add KX)    Progress Note Due on Visit 10    PT Start Time 0829    PT Stop Time 0909    PT Time Calculation (min) 40 min    Activity Tolerance Patient tolerated treatment well    Behavior During Therapy [facility] for tasks assessed/performed           Past Medical History:  Diagnosis Date  . Anemia    PT HAS HAD COLONOSCOPY AND ENDOSCOPY WORK UP - NO PROBLEMS FOUND AS SOURCE OF ANEMIA -- PT HAD IRON INFUSION AT [facility] 11/13/12-AFTER SEEING HEMATOLOGIST DR. [place] AND HE GAVE HEMATOLOGIC CLEARANCE FOR GASTRIC BYPASS SURGERY.  Marland Kitchen Anxiety   . Asthma    daily and prn inhalers  . Degenerative joint disease    right knee, spine - STATES INTERMITTENT  NUMBNESS DOWN RT LEG WITH PROLONGED STANDING OR WALKING- THINKS RELATED TO HER SPINE PROBLEMS  . Dental crowns present   . Depression   . Family history of anesthesia complication    pt's mother and sister have hx. of post-op N/V  . Fibroids    UTERINE  . Gastroesophageal reflux disease    none for 2 years since Bariatric surgery.  . H/O hiatal hernia   . History of endometriosis   . History of kidney stones   . Hyperlipidemia   . Hypertension    under control with med., has been on med. x 2 yr.  . Insulin dependent diabetes mellitus   . Lock jaw    jaw locks open if opens mouth wide  . Migraines   . Neuropathy    FEET  . Obesity   . Palpitations   . PONV (postoperative nausea and vomiting)   . Shortness of breath    with daily activities  . Sleep apnea     post-op didn't need CPAP but now states needs it again but not using    Past Surgical History:  Procedure Laterality Date  . ABDOMINAL HYSTERECTOMY N/A 06/06/2017   Procedure: HYSTERECTOMY ABDOMINAL;  Surgeon: Florian Buff, MD;  Location: [facility];  Service: Gynecology;  Laterality: N/A;  . BACK SURGERY    . BREATH TEK H PYLORI N/A 08/13/2012   Procedure: BREATH TEK H PYLORI;  Surgeon: Edward Jolly, MD;  Location: [facility];  Service: General;  Laterality: N/A;  . CARPAL TUNNEL RELEASE  01/03/2012   Procedure: CARPAL TUNNEL RELEASE;  Surgeon: Wynonia Sours, MD;  Location: [facility];  Service: Orthopedics;  Laterality: Right;  . carpal tunnel release right hand Right 12/13  . COLONOSCOPY  05/2009   DZH:GDJMEQAS hemorrhoids otherwise normal colon, rectum and terminal ileum  . COLONOSCOPY WITH ESOPHAGOGASTRODUODENOSCOPY (EGD) N/A 10/28/2012   Procedure: COLONOSCOPY WITH ESOPHAGOGASTRODUODENOSCOPY (EGD);  Surgeon: Daneil Dolin, MD;  [facility];  Service: Endoscopy;  Laterality: N/A;  7:30  . DILITATION & CURRETTAGE/HYSTROSCOPY WITH NOVASURE ABLATION N/A 07/22/2014   Procedure: DILATATION & CURETTAGE/HYSTEROSCOPY WITH NOVASURE ABLATION; uterine length  6.0 cm; uterine width 3.8 cm; total ablation time 1 minute 15 seconds;  Surgeon: Florian Buff, MD;  Location: [facility];  Service: Gynecology;  Laterality: N/A;  . ESOPHAGOGASTRODUODENOSCOPY  5/013/2011   HDQ:QIWLNL esophagus/multiple polyps removed s/p (hyperplastic). Gastritis without H.pylori.  . ESOPHAGOGASTRODUODENOSCOPY (EGD) WITH PROPOFOL N/A 06/06/2018   with LA Grade A esophagitis s/p dilation.   Marland Kitchen GASTRIC ROUX-EN-Y N/A 11/19/2012   Procedure: LAPAROSCOPIC ROUX-EN-Y GASTRIC;  Surgeon: Edward Jolly, MD;  Location: [facility];  Service: General;  Laterality: N/A;  . GIVENS CAPSULE STUDY N/A 10/28/2012   Procedure: GIVENS CAPSULE STUDY;  Surgeon: Daneil Dolin, MD;  [facility];  Service:  Endoscopy;  Laterality: N/A;  . KNEE ARTHROSCOPY WITH MEDIAL MENISECTOMY Right 10/28/2015   Procedure: RIGHT KNEE ARTHROSCOPY WITH MEDIAL MENISECTOMY;  Surgeon: Carole Civil, MD;  Location: [facility];  Service: Orthopedics;  Laterality: Right;  . LAPAROSCOPY     due to endometriosis  . LUMBAR LAMINECTOMY/DECOMPRESSION MICRODISCECTOMY N/A 02/14/2020   Procedure: LUMBAR LAMINECTOMY  Lumbar three-four, Lumbar four-five;  Surgeon: Consuella Lose, MD;  Location: [facility];  Service: Neurosurgery;  Laterality: N/A;  . Venia Minks DILATION N/A 06/06/2018   Procedure: Venia Minks DILATION;  Surgeon: Daneil Dolin, MD;  [facility];  Service: Endoscopy;  Laterality: N/A;  . PELVIC LAPAROSCOPY  11/04/1999   with fulguration of endometriosis  . SALPINGOOPHORECTOMY Bilateral 06/06/2017   Procedure: SALPINGO OOPHORECTOMY;  Surgeon: Florian Buff, MD;  Location: [facility];  Service: Gynecology;  Laterality: Bilateral;  . TOTAL KNEE ARTHROPLASTY Right 07/22/2019   Procedure: TOTAL KNEE ARTHROPLASTY;  Surgeon: Carole Civil, MD;  Location: [facility];  Service: Orthopedics;  Laterality: Right;  . TRIGGER FINGER RELEASE Right 09/24/2012   Procedure: RELEASE A-1 PULLEY OF RIGHT THUMB;  Surgeon: Wynonia Sours, MD;  Location: [facility];  Service: Orthopedics;  Laterality: Right;  . TUBAL LIGATION  1993    There were no vitals filed for this visit.   Subjective Assessment - 03/29/20 0829    Subjective Patient states she was able to go for a walk over the weekend but her legs got more numb with fatigue. She had some difficulty with hip extension exercise.    Currently in Pain? Yes    Pain Score 1     Pain Location Back    Pain Orientation Lower;Right    Pain Descriptors / Indicators Burning    Pain Type Surgical pain    Pain Onset More than a month ago                             [facility] Adult PT Treatment/Exercise - 03/29/20 0001      Lumbar Exercises: Standing   Heel  Raises 15 reps    Other Standing Lumbar Exercises tandem stance 2x 30 second holds on foam; SLS on ground 2x 20 seconds; palof 2x 10 bilateral green band    Other Standing Lumbar Exercises alternating marches 2x 10 bilateral      Lumbar Exercises: Supine   Straight Leg Raise 10 reps    Straight Leg Raises Limitations with ab sets, 2 sets bilateral      Lumbar Exercises: Sidelying   Hip Abduction Both;10 reps      Lumbar Exercises: Prone   Straight Leg Raise 10 reps;2 seconds    Straight Leg Raises Limitations 2 sets  PT Education - 03/29/20 0829    Education Details Patient educated on HEP, exercise mechanics, avoiding excessive lumbar ROM with hip extension    Person(s) Educated Patient    Methods Explanation;Demonstration;Handout    Comprehension Verbalized understanding;Returned demonstration            PT Short Term Goals - 03/17/20 1627      PT SHORT TERM GOAL #1   Title Patient will be independent with initial HEP and self-management strategies to improve functional outcomes    Time 3    Period Weeks    Status New    Target Date 04/07/20      PT SHORT TERM GOAL #2   Title Patient will report at least 25% improvement in symptoms for improved quality of life.    Time 3    Period Weeks    Status New    Target Date 04/07/20             PT Long Term Goals - 03/17/20 1628      PT LONG TERM GOAL #1   Title Patient will report at least 75% improvement in symptoms for improved quality of life.    Time 6    Period Weeks    Status New    Target Date 04/28/20      PT LONG TERM GOAL #2   Title Patient will improve FOTO score by at least 10 points in order to indicate improved tolerance to activity.    Time 6    Period Weeks    Status New    Target Date 04/28/20      PT LONG TERM GOAL #3   Title Patient will be able to complete 5x STS in under 11.4 seconds in order to reduce the risk of falls.    Time 6    Period Weeks    Status New     Target Date 04/28/20      PT LONG TERM GOAL #4   Title Patient will be able to ambulate at least 450 feet in 2MWT in order to demonstrate improved gait speed for community ambulation.    Time 6    Period Weeks    Status New    Target Date 04/28/20                 Plan - 03/29/20 9798    Clinical Impression Statement Patient educated on importance of limiting excessive lumbar motion with prone hip extension with demonstration and she shows good carry over when performing. Patient educated on TRA activation with exercises completed today for improved core strengthening and stabilization. She requires unilateral UE support with standing exercises secondary to impaired balance and hip strength. Patient showing min/mod sway with tandem stance on foam without loss of balance. Patient very unsteady with SLS requiring frequent HHA with max of 10 seconds on each LE. Patient fatigued following palof exercise for additional core strength and balance. Patient will continue to benefit from skilled physical therapy in order to reduce impairment and improve function.    Personal Factors and Comorbidities Comorbidity 3+;Time since onset of injury/illness/exacerbation;Past/Current Experience;Fitness    Comorbidities Anxiety, Arthritis, Asthma, Back pain, BMI over 30,  Depression, Diabetes, High Blood Pressure, Prior Surgery    Examination-Activity Limitations Locomotion Level;Transfers;Squat;Bend;Sleep;Sit;Bed Mobility    Examination-Participation Restrictions Community Activity;Yard Work;Laundry;Cleaning;Shop;Volunteer    Stability/Clinical Decision Making Stable/Uncomplicated    Rehab Potential Fair    PT Frequency 2x / week    PT Duration 6 weeks  PT Treatment/Interventions ADLs/Self Care Home Management;Aquatic Therapy;Biofeedback;Cryotherapy;Electrical Stimulation;Moist Heat;DME Instruction;Gait training;Stair training;Functional mobility training;Therapeutic activities;Therapeutic  exercise;Balance training;Neuromuscular re-education;Patient/family education;Manual techniques;Scar mobilization;Passive range of motion;Dry needling;Taping;Joint Manipulations;Orthotic Fit/Training    PT Next Visit Plan Glute and core strengthening with table exercise and progress to standing as able, continue balance and gait training    PT Home Exercise Plan 03/19/20: ab set, bridge, clam with RTB 3/2 hip abduction and extension, tandem stance 3/7 SLS    Consulted and Agree with Plan of Care Patient           Patient will benefit from skilled therapeutic intervention in order to improve the following deficits and impairments:  Postural dysfunction,Pain,Decreased activity tolerance,Decreased strength,Impaired perceived functional ability,Improper body mechanics,Impaired sensation,Abnormal gait,Decreased balance,Decreased mobility  Visit Diagnosis: Low back pain, unspecified back pain laterality, unspecified chronicity, unspecified whether sciatica present  Muscle weakness (generalized)  Other abnormalities of gait and mobility  Other symptoms and signs involving the musculoskeletal system     Problem List Patient Active Problem List   Diagnosis Date Noted  . Myelopathy concurrent with and due to stenosis of lumbar spine ([place]) 02/25/2020  . Hypoalbuminemia due to protein-calorie malnutrition ([place])   . Muscle spasm   . Controlled type 2 diabetes mellitus with hyperglycemia, without long-term current use of insulin ([place])   . Essential hypertension   . Drug induced constipation   . Postoperative pain   . Lumbar disc herniation with myelopathy 02/24/2020  . Left foot drop 02/14/2020  . Lumbar spinal stenosis 02/14/2020  . Lumbar radiculopathy 02/13/2020  . Acute right-sided back pain with sciatica 10/02/2019  . S/P total knee replacement, right 07/22/19  09/01/2019  . Primary localized osteoarthritis of left knee 07/22/2019  . Primary osteoarthritis of right knee   . Constipation  09/05/2018  . Abdominal pain, chronic, epigastric 05/27/2018  . Dysphagia 05/27/2018  . Depression, major, single episode, moderate ([place]) 03/22/2018  . S/P complete hysterectomy 06/06/2017  . Derangement of posterior horn of medial meniscus of right knee   . Right knee pain 09/10/2015  . Iron deficiency anemia 10/16/2013  . Fibroids 08/21/2013  . Excessive or frequent menstruation 06/19/2013  . Morbid obesity ([place]) 07/12/2012  . Obstructive sleep apnea 06/21/2012  . Chest pain   . Hyperlipidemia   . Type 2 diabetes mellitus ([place])   . Obesity   . Laboratory test   . Nephrolithiasis   . Asthma   . Endometriosis   . Hypertension   . Iron deficiency anemia 05/11/2009  . GERD 05/11/2009    9:13 AM, 03/29/20 Mearl Latin PT, DPT Physical Therapist at [facility] [place], [place], [address] Phone: [phone]   Fax:  [phone]  Name: Carrie Cook MRN: [id number] Date of Birth: 1960/06/26

## 2020-03-29 NOTE — Patient Instructions (Signed)
Access Code: [email or web address] URL: [email or web address] Date: 03/29/2020 Prepared by: Margie Billet  Exercises Single Leg Stance - 1 x daily - 7 x weekly - 3 reps - 30 seconds hold

## 2020-03-31 ENCOUNTER — Encounter (HOSPITAL_COMMUNITY): Payer: Self-pay | Admitting: Physical Therapy

## 2020-03-31 ENCOUNTER — Ambulatory Visit (HOSPITAL_COMMUNITY): Payer: Medicare Other | Admitting: Physical Therapy

## 2020-03-31 ENCOUNTER — Other Ambulatory Visit: Payer: Self-pay

## 2020-03-31 DIAGNOSIS — M6281 Muscle weakness (generalized): Secondary | ICD-10-CM

## 2020-03-31 DIAGNOSIS — M545 Low back pain, unspecified: Secondary | ICD-10-CM

## 2020-03-31 DIAGNOSIS — R29898 Other symptoms and signs involving the musculoskeletal system: Secondary | ICD-10-CM

## 2020-03-31 DIAGNOSIS — R2689 Other abnormalities of gait and mobility: Secondary | ICD-10-CM

## 2020-03-31 NOTE — Therapy (Signed)
[place], [place], [address] Phone: [phone]   Fax:  [phone]  Physical Therapy Treatment  Patient Details  Name: Carrie Cook MRN: [id number] Date of Birth: 1960-07-05 Referring Provider (PT): Ankit Lorie Phenix MD   Encounter Date: 03/31/2020   PT End of Session - 03/31/20 0827    Visit Number 6    Number of Visits 12    Date for PT Re-Evaluation 04/28/20    Authorization Type Medicare (add KX)    Progress Note Due on Visit 10    PT Start Time 0828    PT Stop Time 0908    PT Time Calculation (min) 40 min    Activity Tolerance Patient tolerated treatment well    Behavior During Therapy [facility] for tasks assessed/performed           Past Medical History:  Diagnosis Date  . Anemia    PT HAS HAD COLONOSCOPY AND ENDOSCOPY WORK UP - NO PROBLEMS FOUND AS SOURCE OF ANEMIA -- PT HAD IRON INFUSION AT [facility] 11/13/12-AFTER SEEING HEMATOLOGIST DR. Wildrose AND HE GAVE HEMATOLOGIC CLEARANCE FOR GASTRIC BYPASS SURGERY.  Marland Kitchen Anxiety   . Asthma    daily and prn inhalers  . Degenerative joint disease    right knee, spine - STATES INTERMITTENT  NUMBNESS DOWN RT LEG WITH PROLONGED STANDING OR WALKING- THINKS RELATED TO HER SPINE PROBLEMS  . Dental crowns present   . Depression   . Family history of anesthesia complication    pt's mother and sister have hx. of post-op N/V  . Fibroids    UTERINE  . Gastroesophageal reflux disease    none for 2 years since Bariatric surgery.  . H/O hiatal hernia   . History of endometriosis   . History of kidney stones   . Hyperlipidemia   . Hypertension    under control with med., has been on med. x 2 yr.  . Insulin dependent diabetes mellitus   . Lock jaw    jaw locks open if opens mouth wide  . Migraines   . Neuropathy    FEET  . Obesity   . Palpitations   . PONV (postoperative nausea and vomiting)   . Shortness of breath    with daily activities  . Sleep apnea     post-op didn't need CPAP but now states needs it again but not using    Past Surgical History:  Procedure Laterality Date  . ABDOMINAL HYSTERECTOMY N/A 06/06/2017   Procedure: HYSTERECTOMY ABDOMINAL;  Surgeon: Florian Buff, MD;  Location: [facility];  Service: Gynecology;  Laterality: N/A;  . BACK SURGERY    . BREATH TEK H PYLORI N/A 08/13/2012   Procedure: BREATH TEK H PYLORI;  Surgeon: Edward Jolly, MD;  Location: [facility];  Service: General;  Laterality: N/A;  . CARPAL TUNNEL RELEASE  01/03/2012   Procedure: CARPAL TUNNEL RELEASE;  Surgeon: Wynonia Sours, MD;  Location: [place];  Service: Orthopedics;  Laterality: Right;  . carpal tunnel release right hand Right 12/13  . COLONOSCOPY  05/2009   DGL:OVFIEPPI hemorrhoids otherwise normal colon, rectum and terminal ileum  . COLONOSCOPY WITH ESOPHAGOGASTRODUODENOSCOPY (EGD) N/A 10/28/2012   Procedure: COLONOSCOPY WITH ESOPHAGOGASTRODUODENOSCOPY (EGD);  Surgeon: Daneil Dolin, MD;  [facility];  Service: Endoscopy;  Laterality: N/A;  7:30  . DILITATION & CURRETTAGE/HYSTROSCOPY WITH NOVASURE ABLATION N/A 07/22/2014   Procedure: DILATATION & CURETTAGE/HYSTEROSCOPY WITH NOVASURE ABLATION; uterine length  6.0 cm; uterine width 3.8 cm; total ablation time 1 minute 15 seconds;  Surgeon: Florian Buff, MD;  Location: [facility];  Service: Gynecology;  Laterality: N/A;  . ESOPHAGOGASTRODUODENOSCOPY  5/013/2011   WNI:OEVOJJ esophagus/multiple polyps removed s/p (hyperplastic). Gastritis without H.pylori.  . ESOPHAGOGASTRODUODENOSCOPY (EGD) WITH PROPOFOL N/A 06/06/2018   with LA Grade A esophagitis s/p dilation.   Marland Kitchen GASTRIC ROUX-EN-Y N/A 11/19/2012   Procedure: LAPAROSCOPIC ROUX-EN-Y GASTRIC;  Surgeon: Edward Jolly, MD;  Location: [facility];  Service: General;  Laterality: N/A;  . GIVENS CAPSULE STUDY N/A 10/28/2012   Procedure: GIVENS CAPSULE STUDY;  Surgeon: Daneil Dolin, MD;  [facility];  Service:  Endoscopy;  Laterality: N/A;  . KNEE ARTHROSCOPY WITH MEDIAL MENISECTOMY Right 10/28/2015   Procedure: RIGHT KNEE ARTHROSCOPY WITH MEDIAL MENISECTOMY;  Surgeon: Carole Civil, MD;  [facility];  Service: Orthopedics;  Laterality: Right;  . LAPAROSCOPY     due to endometriosis  . LUMBAR LAMINECTOMY/DECOMPRESSION MICRODISCECTOMY N/A 02/14/2020   Procedure: LUMBAR LAMINECTOMY  Lumbar three-four, Lumbar four-five;  Surgeon: Consuella Lose, MD;  Location: [place];  Service: Neurosurgery;  Laterality: N/A;  . Venia Minks DILATION N/A 06/06/2018   Procedure: Venia Minks DILATION;  Surgeon: Daneil Dolin, MD;  [facility];  Service: Endoscopy;  Laterality: N/A;  . PELVIC LAPAROSCOPY  11/04/1999   with fulguration of endometriosis  . SALPINGOOPHORECTOMY Bilateral 06/06/2017   Procedure: SALPINGO OOPHORECTOMY;  Surgeon: Florian Buff, MD;  Location: [facility];  Service: Gynecology;  Laterality: Bilateral;  . TOTAL KNEE ARTHROPLASTY Right 07/22/2019   Procedure: TOTAL KNEE ARTHROPLASTY;  Surgeon: Carole Civil, MD;  Location[facility];  Service: Orthopedics;  Laterality: Right;  . TRIGGER FINGER RELEASE Right 09/24/2012   Procedure: RELEASE A-1 PULLEY OF RIGHT THUMB;  Surgeon: Wynonia Sours, MD;  Location: [place] [facility];  Service: Orthopedics;  Laterality: Right;  . TUBAL LIGATION  1993    There were no vitals filed for this visit.   Subjective Assessment - 03/31/20 0828    Subjective Patient states she still has R side low back pain. She is still having trouble with balance exercise on L leg. She continues to have numbness in RLE intermittently.    Currently in Pain? Yes    Pain Score 2     Pain Location Back    Pain Orientation Lower    Pain Descriptors / Indicators Burning    Pain Type Surgical pain    Pain Onset More than a month ago                             [facility] Adult PT Treatment/Exercise - 03/31/20 0001      Lumbar Exercises: Stretches    Gastroc Stretch Right;Left;3 reps;30 seconds    Gastroc Stretch Limitations slant board      Lumbar Exercises: Standing   Heel Raises 20 reps    Other Standing Lumbar Exercises tandem stance 2x 30 second holds on foam; SLS on ground 2x 30 seconds; palof 2x 10 bilateral green band; Row 2x 15 green band, shoulder ext 2x 15 green band    Other Standing Lumbar Exercises step up 6 inch step 2x10 bilateral; rockerboard 2x 1 minute      Lumbar Exercises: Seated   Sit to Stand 10 reps    Sit to Stand Limitations 2 sets  PT Education - 03/31/20 0827    Education Details HEP, exercise mechanics    Person(s) Educated Patient    Methods Explanation;Demonstration    Comprehension Verbalized understanding;Returned demonstration            PT Short Term Goals - 03/17/20 1627      PT SHORT TERM GOAL #1   Title Patient will be independent with initial HEP and self-management strategies to improve functional outcomes    Time 3    Period Weeks    Status New    Target Date 04/07/20      PT SHORT TERM GOAL #2   Title Patient will report at least 25% improvement in symptoms for improved quality of life.    Time 3    Period Weeks    Status New    Target Date 04/07/20             PT Long Term Goals - 03/17/20 1628      PT LONG TERM GOAL #1   Title Patient will report at least 75% improvement in symptoms for improved quality of life.    Time 6    Period Weeks    Status New    Target Date 04/28/20      PT LONG TERM GOAL #2   Title Patient will improve FOTO score by at least 10 points in order to indicate improved tolerance to activity.    Time 6    Period Weeks    Status New    Target Date 04/28/20      PT LONG TERM GOAL #3   Title Patient will be able to complete 5x STS in under 11.4 seconds in order to reduce the risk of falls.    Time 6    Period Weeks    Status New    Target Date 04/28/20      PT LONG TERM GOAL #4   Title Patient will be able to  ambulate at least 450 feet in 2MWT in order to demonstrate improved gait speed for community ambulation.    Time 6    Period Weeks    Status New    Target Date 04/28/20                 Plan - 03/31/20 0827    Clinical Impression Statement Patient continues to have knee pain lacking TKE and strength likely contributing to symptoms. Patient showing some unsteadiness with step up requiring intermittent UE support on LLE and constant unilateral UE support on RLE. Patient with min/mod unsteadiness with static balance training requiring intermittent HHA. She demonstrates greater difficulty with balance on LLE. Continued and added to resisted postural strengthening with cueing for TRA activation. Patient will continue to benefit from skilled physical therapy in order to reduce impairment and improve function.    Personal Factors and Comorbidities Comorbidity 3+;Time since onset of injury/illness/exacerbation;Past/Current Experience;Fitness    Comorbidities Anxiety, Arthritis, Asthma, Back pain, BMI over 30,  Depression, Diabetes, High Blood Pressure, Prior Surgery    Examination-Activity Limitations Locomotion Level;Transfers;Squat;Bend;Sleep;Sit;Bed Mobility    Examination-Participation Restrictions Community Activity;Yard Work;Laundry;Cleaning;Shop;Volunteer    Stability/Clinical Decision Making Stable/Uncomplicated    Rehab Potential Fair    PT Frequency 2x / week    PT Duration 6 weeks    PT Treatment/Interventions ADLs/Self Care Home Management;Aquatic Therapy;Biofeedback;Cryotherapy;Electrical Stimulation;Moist Heat;DME Instruction;Gait training;Stair training;Functional mobility training;Therapeutic activities;Therapeutic exercise;Balance training;Neuromuscular re-education;Patient/family education;Manual techniques;Scar mobilization;Passive range of motion;Dry needling;Taping;Joint Manipulations;Orthotic Fit/Training    PT Next Visit Plan Glute and  core strengthening with table exercise  and progress to standing as able, continue balance and gait training    PT Home Exercise Plan 03/19/20: ab set, bridge, clam with RTB 3/2 hip abduction and extension, tandem stance 3/7 SLS 3/9 STS    Consulted and Agree with Plan of Care Patient           Patient will benefit from skilled therapeutic intervention in order to improve the following deficits and impairments:  Postural dysfunction,Pain,Decreased activity tolerance,Decreased strength,Impaired perceived functional ability,Improper body mechanics,Impaired sensation,Abnormal gait,Decreased balance,Decreased mobility  Visit Diagnosis: Low back pain, unspecified back pain laterality, unspecified chronicity, unspecified whether sciatica present  Muscle weakness (generalized)  Other abnormalities of gait and mobility  Other symptoms and signs involving the musculoskeletal system     Problem List Patient Active Problem List   Diagnosis Date Noted  . Myelopathy concurrent with and due to stenosis of lumbar spine ([place]) 02/25/2020  . Hypoalbuminemia due to protein-calorie malnutrition ([place])   . Muscle spasm   . Controlled type 2 diabetes mellitus with hyperglycemia, without long-term current use of insulin ([place])   . Essential hypertension   . Drug induced constipation   . Postoperative pain   . Lumbar disc herniation with myelopathy 02/24/2020  . Left foot drop 02/14/2020  . Lumbar spinal stenosis 02/14/2020  . Lumbar radiculopathy 02/13/2020  . Acute right-sided back pain with sciatica 10/02/2019  . S/P total knee replacement, right 07/22/19  09/01/2019  . Primary localized osteoarthritis of left knee 07/22/2019  . Primary osteoarthritis of right knee   . Constipation 09/05/2018  . Abdominal pain, chronic, epigastric 05/27/2018  . Dysphagia 05/27/2018  . Depression, major, single episode, moderate ([place]) 03/22/2018  . S/P complete hysterectomy 06/06/2017  . Derangement of posterior horn of medial meniscus of right knee    . Right knee pain 09/10/2015  . Iron deficiency anemia 10/16/2013  . Fibroids 08/21/2013  . Excessive or frequent menstruation 06/19/2013  . Morbid obesity ([place]) 07/12/2012  . Obstructive sleep apnea 06/21/2012  . Chest pain   . Hyperlipidemia   . Type 2 diabetes mellitus ([place])   . Obesity   . Laboratory test   . Nephrolithiasis   . Asthma   . Endometriosis   . Hypertension   . Iron deficiency anemia 05/11/2009  . GERD 05/11/2009   9:09 AM, 03/31/20 Mearl Latin PT, DPT Physical Therapist at [place], [place], [address] Phone: [phone]   Fax:  [phone]  Name: Carrie Cook MRN: [id number] Date of Birth: [birth year]

## 2020-03-31 NOTE — Patient Instructions (Signed)
Access Code: [email or web address] URL: [email or web address] Date: 03/31/2020 Prepared by: Mitzi Hansen Lanique Gonzalo  Exercises Sit to Stand with Arms Crossed - 1 x daily - 7 x weekly - 2 sets - 10 reps

## 2020-04-04 ENCOUNTER — Other Ambulatory Visit: Payer: Self-pay | Admitting: Obstetrics & Gynecology

## 2020-04-05 ENCOUNTER — Other Ambulatory Visit: Payer: Self-pay

## 2020-04-05 ENCOUNTER — Ambulatory Visit (HOSPITAL_COMMUNITY): Payer: Medicare Other | Admitting: Physical Therapy

## 2020-04-05 ENCOUNTER — Encounter (HOSPITAL_COMMUNITY): Payer: Self-pay | Admitting: Physical Therapy

## 2020-04-05 DIAGNOSIS — R29898 Other symptoms and signs involving the musculoskeletal system: Secondary | ICD-10-CM

## 2020-04-05 DIAGNOSIS — M545 Low back pain, unspecified: Secondary | ICD-10-CM | POA: Diagnosis not present

## 2020-04-05 DIAGNOSIS — R2689 Other abnormalities of gait and mobility: Secondary | ICD-10-CM

## 2020-04-05 DIAGNOSIS — M6281 Muscle weakness (generalized): Secondary | ICD-10-CM

## 2020-04-05 NOTE — Patient Instructions (Signed)
Access Code: [email or web address] URL: [email or web address] Date: 04/05/2020 Prepared by: Mitzi Hansen Pierre Cumpton  Exercises Single Leg Balance with Clock Reach - 1 x daily - 7 x weekly - 5 reps - 5 second hold

## 2020-04-05 NOTE — Therapy (Signed)
[place], [place], [address] Phone: [phone]   Fax:  [phone]  Physical Therapy Treatment  Patient Details  Name: Carrie Cook MRN: [id number] Date of Birth: 1960/11/24 Referring Provider (PT): Ankit Lorie Phenix MD   Encounter Date: 04/05/2020   PT End of Session - 04/05/20 0824    Visit Number 7    Number of Visits 12    Date for PT Re-Evaluation 04/28/20    Authorization Type Medicare (add KX)    Progress Note Due on Visit 10    PT Start Time 0827    PT Stop Time 0907    PT Time Calculation (min) 40 min    Activity Tolerance Patient tolerated treatment well    Behavior During Therapy [facility] for tasks assessed/performed           Past Medical History:  Diagnosis Date  . Anemia    PT HAS HAD COLONOSCOPY AND ENDOSCOPY WORK UP - NO PROBLEMS FOUND AS SOURCE OF ANEMIA -- PT HAD IRON INFUSION AT [facility] 11/13/12-AFTER SEEING HEMATOLOGIST DR. Larchmont AND HE GAVE HEMATOLOGIC CLEARANCE FOR GASTRIC BYPASS SURGERY.  Marland Kitchen Anxiety   . Asthma    daily and prn inhalers  . Degenerative joint disease    right knee, spine - STATES INTERMITTENT  NUMBNESS DOWN RT LEG WITH PROLONGED STANDING OR WALKING- THINKS RELATED TO HER SPINE PROBLEMS  . Dental crowns present   . Depression   . Family history of anesthesia complication    pt's mother and sister have hx. of post-op N/V  . Fibroids    UTERINE  . Gastroesophageal reflux disease    none for 2 years since Bariatric surgery.  . H/O hiatal hernia   . History of endometriosis   . History of kidney stones   . Hyperlipidemia   . Hypertension    under control with med., has been on med. x 2 yr.  . Insulin dependent diabetes mellitus   . Lock jaw    jaw locks open if opens mouth wide  . Migraines   . Neuropathy    FEET  . Obesity   . Palpitations   . PONV (postoperative nausea and vomiting)   . Shortness of breath    with daily activities  . Sleep apnea     post-op didn't need CPAP but now states needs it again but not using    Past Surgical History:  Procedure Laterality Date  . ABDOMINAL HYSTERECTOMY N/A 06/06/2017   Procedure: HYSTERECTOMY ABDOMINAL;  Surgeon: Florian Buff, MD;  Location: [facility];  Service: Gynecology;  Laterality: N/A;  . BACK SURGERY    . BREATH TEK H PYLORI N/A 08/13/2012   Procedure: BREATH TEK H PYLORI;  Surgeon: Edward Jolly, MD;  Location: [facility];  Service: General;  Laterality: N/A;  . CARPAL TUNNEL RELEASE  01/03/2012   Procedure: CARPAL TUNNEL RELEASE;  Surgeon: Wynonia Sours, MD;  Location: [facility];  Service: Orthopedics;  Laterality: Right;  . carpal tunnel release right hand Right 12/13  . COLONOSCOPY  05/2009   DBZ:[id number] hemorrhoids otherwise normal colon, rectum and terminal ileum  . COLONOSCOPY WITH ESOPHAGOGASTRODUODENOSCOPY (EGD) N/A 10/28/2012   Procedure: COLONOSCOPY WITH ESOPHAGOGASTRODUODENOSCOPY (EGD);  Surgeon: Daneil Dolin, MD;  [facility];  Service: Endoscopy;  Laterality: N/A;  7:30  . DILITATION & CURRETTAGE/HYSTROSCOPY WITH NOVASURE ABLATION N/A 07/22/2014   Procedure: DILATATION & CURETTAGE/HYSTEROSCOPY WITH NOVASURE ABLATION; uterine length  6.0 cm; uterine width 3.8 cm; total ablation time 1 minute 15 seconds;  Surgeon: Florian Buff, MD;  Location: [facility];  Service: Gynecology;  Laterality: N/A;  . ESOPHAGOGASTRODUODENOSCOPY  5/013/2011   ZDG:UYQIHK esophagus/multiple polyps removed s/p (hyperplastic). Gastritis without H.pylori.  . ESOPHAGOGASTRODUODENOSCOPY (EGD) WITH PROPOFOL N/A 06/06/2018   with LA Grade A esophagitis s/p dilation.   Marland Kitchen GASTRIC ROUX-EN-Y N/A 11/19/2012   Procedure: LAPAROSCOPIC ROUX-EN-Y GASTRIC;  Surgeon: Edward Jolly, MD;  Location: [facility];  Service: General;  Laterality: N/A;  . GIVENS CAPSULE STUDY N/A 10/28/2012   Procedure: GIVENS CAPSULE STUDY;  Surgeon: Daneil Dolin, MD;  [facility];  Service:  Endoscopy;  Laterality: N/A;  . KNEE ARTHROSCOPY WITH MEDIAL MENISECTOMY Right 10/28/2015   Procedure: RIGHT KNEE ARTHROSCOPY WITH MEDIAL MENISECTOMY;  Surgeon: Carole Civil, MD;  Location: [facility];  Service: Orthopedics;  Laterality: Right;  . LAPAROSCOPY     due to endometriosis  . LUMBAR LAMINECTOMY/DECOMPRESSION MICRODISCECTOMY N/A 02/14/2020   Procedure: LUMBAR LAMINECTOMY  Lumbar three-four, Lumbar four-five;  Surgeon: Consuella Lose, MD;  Location: [facility];  Service: Neurosurgery;  Laterality: N/A;  . Venia Minks DILATION N/A 06/06/2018   Procedure: Venia Minks DILATION;  Surgeon: Daneil Dolin, MD;  [facility];  Service: Endoscopy;  Laterality: N/A;  . PELVIC LAPAROSCOPY  11/04/1999   with fulguration of endometriosis  . SALPINGOOPHORECTOMY Bilateral 06/06/2017   Procedure: SALPINGO OOPHORECTOMY;  Surgeon: Florian Buff, MD;  Location: [facility];  Service: Gynecology;  Laterality: Bilateral;  . TOTAL KNEE ARTHROPLASTY Right 07/22/2019   Procedure: TOTAL KNEE ARTHROPLASTY;  Surgeon: Carole Civil, MD;  Location: [facility];  Service: Orthopedics;  Laterality: Right;  . TRIGGER FINGER RELEASE Right 09/24/2012   Procedure: RELEASE A-1 PULLEY OF RIGHT THUMB;  Surgeon: Wynonia Sours, MD;  Location: [facility];  Service: Orthopedics;  Laterality: Right;  . TUBAL LIGATION  1993    There were no vitals filed for this visit.   Subjective Assessment - 04/05/20 0826    Subjective Patient states she has been walking without her cane but is having more back and hip soreness now.    Currently in Pain? Yes    Pain Score 3     Pain Location Back    Pain Orientation Lower    Pain Descriptors / Indicators Burning    Pain Type Surgical pain    Pain Onset More than a month ago                             [facility] Adult PT Treatment/Exercise - 04/05/20 0001      Lumbar Exercises: Standing   Heel Raises 20 reps    Other Standing Lumbar Exercises tandem  stance 2x 30 second holds on foam; SLS on ground 2x 30 seconds; palof 2x 15 bilateral green band; Row 2x 15 green band, shoulder ext 2x 15 green band; SLS with vectors 5x 5 second holds bilateral with unilateral UE support; lateral stepping 8x 10 feet bilateral    Other Standing Lumbar Exercises step up 6 inch step 2x 15 bilateral; lateral step down 1x 10 with eccentric control; rockerboard 2x 1 minute lateral and A/P                  PT Education - 04/05/20 0826    Education Details HEP, exercise mechanics    Person(s) Educated Patient    Methods  Explanation;Demonstration    Comprehension Verbalized understanding;Returned demonstration            PT Short Term Goals - 04/05/20 0900      PT SHORT TERM GOAL #1   Title Patient will be independent with initial HEP and self-management strategies to improve functional outcomes    Time 3    Period Weeks    Status Achieved    Target Date 04/07/20      PT SHORT TERM GOAL #2   Title Patient will report at least 25% improvement in symptoms for improved quality of life.    Time 3    Period Weeks    Status On-going    Target Date 04/07/20             PT Long Term Goals - 04/05/20 0900      PT LONG TERM GOAL #1   Title Patient will report at least 75% improvement in symptoms for improved quality of life.    Time 6    Period Weeks    Status On-going      PT LONG TERM GOAL #2   Title Patient will improve FOTO score by at least 10 points in order to indicate improved tolerance to activity.    Time 6    Period Weeks    Status On-going      PT LONG TERM GOAL #3   Title Patient will be able to complete 5x STS in under 11.4 seconds in order to reduce the risk of falls.    Time 6    Period Weeks    Status On-going      PT LONG TERM GOAL #4   Title Patient will be able to ambulate at least 450 feet in 2MWT in order to demonstrate improved gait speed for community ambulation.    Time 6    Period Weeks    Status On-going                  Plan - 04/05/20 0825    Clinical Impression Statement Patient has met goal with ability to complete HEP. Patient ambulating with good mechanics without AD with decreased cadence but without loss of balance. Patient able to complete increased reps of step up today but continues to require support for balance while completing. Patient able to complete lateral step down with cueing for eccentric control with good mechanics. Patient continues to have sway with balance exercises requiring intermittent UE support with greatest sway with tandem on foam. Patient given cueing for core activation with all postural strengthening. Patient will continue to benefit from skilled physical therapy in order to reduce impairment and improve function.    Personal Factors and Comorbidities Comorbidity 3+;Time since onset of injury/illness/exacerbation;Past/Current Experience;Fitness    Comorbidities Anxiety, Arthritis, Asthma, Back pain, BMI over 30,  Depression, Diabetes, High Blood Pressure, Prior Surgery    Examination-Activity Limitations Locomotion Level;Transfers;Squat;Bend;Sleep;Sit;Bed Mobility    Examination-Participation Restrictions Community Activity;Yard Work;Laundry;Cleaning;Shop;Volunteer    Stability/Clinical Decision Making Stable/Uncomplicated    Rehab Potential Fair    PT Frequency 2x / week    PT Duration 6 weeks    PT Treatment/Interventions ADLs/Self Care Home Management;Aquatic Therapy;Biofeedback;Cryotherapy;Electrical Stimulation;Moist Heat;DME Instruction;Gait training;Stair training;Functional mobility training;Therapeutic activities;Therapeutic exercise;Balance training;Neuromuscular re-education;Patient/family education;Manual techniques;Scar mobilization;Passive range of motion;Dry needling;Taping;Joint Manipulations;Orthotic Fit/Training    PT Next Visit Plan Glute and core strengthening with table exercise and progress to standing as able, continue balance and gait  training    PT Home Exercise Plan 03/19/20:  ab set, bridge, clam with RTB 3/2 hip abduction and extension, tandem stance 3/7 SLS 3/9 STS 3/14 SLS with vectors    Consulted and Agree with Plan of Care Patient           Patient will benefit from skilled therapeutic intervention in order to improve the following deficits and impairments:  Postural dysfunction,Pain,Decreased activity tolerance,Decreased strength,Impaired perceived functional ability,Improper body mechanics,Impaired sensation,Abnormal gait,Decreased balance,Decreased mobility  Visit Diagnosis: Low back pain, unspecified back pain laterality, unspecified chronicity, unspecified whether sciatica present  Muscle weakness (generalized)  Other abnormalities of gait and mobility  Other symptoms and signs involving the musculoskeletal system     Problem List Patient Active Problem List   Diagnosis Date Noted  . Myelopathy concurrent with and due to stenosis of lumbar spine ([place]) 02/25/2020  . Hypoalbuminemia due to protein-calorie malnutrition ([place])   . Muscle spasm   . Controlled type 2 diabetes mellitus with hyperglycemia, without long-term current use of insulin ([place])   . Essential hypertension   . Drug induced constipation   . Postoperative pain   . Lumbar disc herniation with myelopathy 02/24/2020  . Left foot drop 02/14/2020  . Lumbar spinal stenosis 02/14/2020  . Lumbar radiculopathy 02/13/2020  . Acute right-sided back pain with sciatica 10/02/2019  . S/P total knee replacement, right 07/22/19  09/01/2019  . Primary localized osteoarthritis of left knee 07/22/2019  . Primary osteoarthritis of right knee   . Constipation 09/05/2018  . Abdominal pain, chronic, epigastric 05/27/2018  . Dysphagia 05/27/2018  . Depression, major, single episode, moderate ([place]) 03/22/2018  . S/P complete hysterectomy 06/06/2017  . Derangement of posterior horn of medial meniscus of right knee   . Right knee pain 09/10/2015  . Iron  deficiency anemia 10/16/2013  . Fibroids 08/21/2013  . Excessive or frequent menstruation 06/19/2013  . Morbid obesity ([place]) 07/12/2012  . Obstructive sleep apnea 06/21/2012  . Chest pain   . Hyperlipidemia   . Type 2 diabetes mellitus ([place])   . Obesity   . Laboratory test   . Nephrolithiasis   . Asthma   . Endometriosis   . Hypertension   . Iron deficiency anemia 05/11/2009  . GERD 05/11/2009   9:07 AM, 04/05/20 Mearl Latin PT, DPT Physical Therapist at [facility] [place], [place], [address] Phone: [phone]   Fax:  [phone]  Name: Carrie Cook MRN: [id number] Date of Birth: 28-Jul-1960

## 2020-04-07 ENCOUNTER — Encounter (HOSPITAL_COMMUNITY): Payer: Self-pay | Admitting: Physical Therapy

## 2020-04-07 ENCOUNTER — Other Ambulatory Visit: Payer: Self-pay

## 2020-04-07 ENCOUNTER — Ambulatory Visit (HOSPITAL_COMMUNITY): Payer: Medicare Other | Admitting: Physical Therapy

## 2020-04-07 DIAGNOSIS — M545 Low back pain, unspecified: Secondary | ICD-10-CM | POA: Diagnosis not present

## 2020-04-07 DIAGNOSIS — R29898 Other symptoms and signs involving the musculoskeletal system: Secondary | ICD-10-CM

## 2020-04-07 DIAGNOSIS — F339 Major depressive disorder, recurrent, unspecified: Secondary | ICD-10-CM | POA: Diagnosis not present

## 2020-04-07 DIAGNOSIS — M6281 Muscle weakness (generalized): Secondary | ICD-10-CM

## 2020-04-07 DIAGNOSIS — R2689 Other abnormalities of gait and mobility: Secondary | ICD-10-CM

## 2020-04-07 NOTE — Therapy (Signed)
[place], [place], [address] Phone: [phone]   Fax:  [phone]  Physical Therapy Treatment  Patient Details  Name: Carrie Cook MRN: [id number] Date of Birth: [birth year] Referring Provider (PT): Ankit Lorie Phenix MD   Encounter Date: 04/07/2020   PT End of Session - 04/07/20 0829    Visit Number 8    Number of Visits 12    Date for PT Re-Evaluation 04/28/20    Authorization Type Medicare (add KX)    Progress Note Due on Visit 10    PT Start Time 0829    PT Stop Time 0909    PT Time Calculation (min) 40 min    Activity Tolerance Patient tolerated treatment well    Behavior During Therapy [facility] for tasks assessed/performed           Past Medical History:  Diagnosis Date  . Anemia    PT HAS HAD COLONOSCOPY AND ENDOSCOPY WORK UP - NO PROBLEMS FOUND AS SOURCE OF ANEMIA -- PT HAD IRON INFUSION AT [facility] 11/13/12-AFTER SEEING HEMATOLOGIST DR. Cornell AND HE GAVE HEMATOLOGIC CLEARANCE FOR GASTRIC BYPASS SURGERY.  Marland Kitchen Anxiety   . Asthma    daily and prn inhalers  . Degenerative joint disease    right knee, spine - STATES INTERMITTENT  NUMBNESS DOWN RT LEG WITH PROLONGED STANDING OR WALKING- THINKS RELATED TO HER SPINE PROBLEMS  . Dental crowns present   . Depression   . Family history of anesthesia complication    pt's mother and sister have hx. of post-op N/V  . Fibroids    UTERINE  . Gastroesophageal reflux disease    none for 2 years since Bariatric surgery.  . H/O hiatal hernia   . History of endometriosis   . History of kidney stones   . Hyperlipidemia   . Hypertension    under control with med., has been on med. x 2 yr.  . Insulin dependent diabetes mellitus   . Lock jaw    jaw locks open if opens mouth wide  . Migraines   . Neuropathy    FEET  . Obesity   . Palpitations   . PONV (postoperative nausea and vomiting)   . Shortness of breath    with daily activities  . Sleep apnea     post-op didn't need CPAP but now states needs it again but not using    Past Surgical History:  Procedure Laterality Date  . ABDOMINAL HYSTERECTOMY N/A 06/06/2017   Procedure: HYSTERECTOMY ABDOMINAL;  Surgeon: Florian Buff, MD;  [facility];  Service: Gynecology;  Laterality: N/A;  . BACK SURGERY    . BREATH TEK H PYLORI N/A 08/13/2012   Procedure: BREATH TEK H PYLORI;  Surgeon: Edward Jolly, MD;  Location: [facility];  Service: General;  Laterality: N/A;  . CARPAL TUNNEL RELEASE  01/03/2012   Procedure: CARPAL TUNNEL RELEASE;  Surgeon: Wynonia Sours, MD;  Location: [place];  Service: Orthopedics;  Laterality: Right;  . carpal tunnel release right hand Right 12/13  . COLONOSCOPY  05/2009   ATF:[id number] hemorrhoids otherwise normal colon, rectum and terminal ileum  . COLONOSCOPY WITH ESOPHAGOGASTRODUODENOSCOPY (EGD) N/A 10/28/2012   Procedure: COLONOSCOPY WITH ESOPHAGOGASTRODUODENOSCOPY (EGD);  Surgeon: Daneil Dolin, MD;  [facility];  Service: Endoscopy;  Laterality: N/A;  7:30  . DILITATION & CURRETTAGE/HYSTROSCOPY WITH NOVASURE ABLATION N/A 07/22/2014   Procedure: DILATATION & CURETTAGE/HYSTEROSCOPY WITH NOVASURE ABLATION; uterine length  6.0 cm; uterine width 3.8 cm; total ablation time 1 minute 15 seconds;  Surgeon: Florian Buff, MD;  [facility];  Service: Gynecology;  Laterality: N/A;  . ESOPHAGOGASTRODUODENOSCOPY  5/013/2011   HWK:GSUPJS esophagus/multiple polyps removed s/p (hyperplastic). Gastritis without H.pylori.  . ESOPHAGOGASTRODUODENOSCOPY (EGD) WITH PROPOFOL N/A 06/06/2018   with LA Grade A esophagitis s/p dilation.   Marland Kitchen GASTRIC ROUX-EN-Y N/A 11/19/2012   Procedure: LAPAROSCOPIC ROUX-EN-Y GASTRIC;  Surgeon: Edward Jolly, MD;  [facility];  Service: General;  Laterality: N/A;  . GIVENS CAPSULE STUDY N/A 10/28/2012   Procedure: GIVENS CAPSULE STUDY;  Surgeon: Daneil Dolin, MD;  [facility];  Service:  Endoscopy;  Laterality: N/A;  . KNEE ARTHROSCOPY WITH MEDIAL MENISECTOMY Right 10/28/2015   Procedure: RIGHT KNEE ARTHROSCOPY WITH MEDIAL MENISECTOMY;  Surgeon: Carole Civil, MD;  [facility];  Service: Orthopedics;  Laterality: Right;  . LAPAROSCOPY     due to endometriosis  . LUMBAR LAMINECTOMY/DECOMPRESSION MICRODISCECTOMY N/A 02/14/2020   Procedure: LUMBAR LAMINECTOMY  Lumbar three-four, Lumbar four-five;  Surgeon: Consuella Lose, MD;  Location: [place];  Service: Neurosurgery;  Laterality: N/A;  . Venia Minks DILATION N/A 06/06/2018   Procedure: Venia Minks DILATION;  Surgeon: Daneil Dolin, MD;  [facility];  Service: Endoscopy;  Laterality: N/A;  . PELVIC LAPAROSCOPY  11/04/1999   with fulguration of endometriosis  . SALPINGOOPHORECTOMY Bilateral 06/06/2017   Procedure: SALPINGO OOPHORECTOMY;  Surgeon: Florian Buff, MD;  [facility];  Service: Gynecology;  Laterality: Bilateral;  . TOTAL KNEE ARTHROPLASTY Right 07/22/2019   Procedure: TOTAL KNEE ARTHROPLASTY;  Surgeon: Carole Civil, MD;  [facility];  Service: Orthopedics;  Laterality: Right;  . TRIGGER FINGER RELEASE Right 09/24/2012   Procedure: RELEASE A-1 PULLEY OF RIGHT THUMB;  Surgeon: Wynonia Sours, MD;  Location: [place];  Service: Orthopedics;  Laterality: Right;  . TUBAL LIGATION  1993    There were no vitals filed for this visit.   Subjective Assessment - 04/07/20 0830    Subjective Patient states she is feeling weak. She is having the burning in her back but also pain in her R hip. She had to use her cane today.    Currently in Pain? Yes    Pain Score 4     Pain Location Back    Pain Orientation Lower    Pain Descriptors / Indicators Burning    Pain Type Surgical pain    Pain Onset More than a month ago                             [facility] Adult PT Treatment/Exercise - 04/07/20 0001      Lumbar Exercises: Stretches   Piriformis Stretch Right;Left;2  reps;30 seconds      Lumbar Exercises: Standing   Heel Raises 20 reps    Other Standing Lumbar Exercises palof 2x 15 bilateral blue band; Row 2x 15 blue band, shoulder ext 2x 15 blue band    Other Standing Lumbar Exercises step up 6 inch step with contralateral knee drive 2x 10 bilateral; lateral step down 1x 10 with eccentric control 6 inch step; SLS with vectors 5x5 second holds bilateral                  PT Education - 04/07/20 0830    Education Details HEP, exercise mechanics    Person(s) Educated Patient    Methods  Explanation;Demonstration    Comprehension Verbalized understanding;Returned demonstration            PT Short Term Goals - 04/05/20 0900      PT SHORT TERM GOAL #1   Title Patient will be independent with initial HEP and self-management strategies to improve functional outcomes    Time 3    Period Weeks    Status Achieved    Target Date 04/07/20      PT SHORT TERM GOAL #2   Title Patient will report at least 25% improvement in symptoms for improved quality of life.    Time 3    Period Weeks    Status On-going    Target Date 04/07/20             PT Long Term Goals - 04/05/20 0900      PT LONG TERM GOAL #1   Title Patient will report at least 75% improvement in symptoms for improved quality of life.    Time 6    Period Weeks    Status On-going      PT LONG TERM GOAL #2   Title Patient will improve FOTO score by at least 10 points in order to indicate improved tolerance to activity.    Time 6    Period Weeks    Status On-going      PT LONG TERM GOAL #3   Title Patient will be able to complete 5x STS in under 11.4 seconds in order to reduce the risk of falls.    Time 6    Period Weeks    Status On-going      PT LONG TERM GOAL #4   Title Patient will be able to ambulate at least 450 feet in 2MWT in order to demonstrate improved gait speed for community ambulation.    Time 6    Period Weeks    Status On-going                  Plan - 04/07/20 5956    Clinical Impression Statement Completed step up with knee drive for improving glute activation/strength and balance. Supine piriformis stretch targets tender area with improving in symptoms following. Patient given cueing for eccentric control with lateral step down with good carry over and states quad fatigue following.  Patient completes SLS with vectors on step to avoid compensation for perceived leg length discrepancy with decreased irritation in symptoms. Patient completes postural strengthening with increased resistance but requires intermittent cueing for mechanics/posture. Patient will continue to benefit from skilled physical therapy in order to reduce impairment and improve function.    Personal Factors and Comorbidities Comorbidity 3+;Time since onset of injury/illness/exacerbation;Past/Current Experience;Fitness    Comorbidities Anxiety, Arthritis, Asthma, Back pain, BMI over 30,  Depression, Diabetes, High Blood Pressure, Prior Surgery    Examination-Activity Limitations Locomotion Level;Transfers;Squat;Bend;Sleep;Sit;Bed Mobility    Examination-Participation Restrictions Community Activity;Yard Work;Laundry;Cleaning;Shop;Volunteer    Stability/Clinical Decision Making Stable/Uncomplicated    Rehab Potential Fair    PT Frequency 2x / week    PT Duration 6 weeks    PT Treatment/Interventions ADLs/Self Care Home Management;Aquatic Therapy;Biofeedback;Cryotherapy;Electrical Stimulation;Moist Heat;DME Instruction;Gait training;Stair training;Functional mobility training;Therapeutic activities;Therapeutic exercise;Balance training;Neuromuscular re-education;Patient/family education;Manual techniques;Scar mobilization;Passive range of motion;Dry needling;Taping;Joint Manipulations;Orthotic Fit/Training    PT Next Visit Plan Glute and core strengthening with table exercise and progress to standing as able, continue balance and gait training    PT Home  Exercise Plan 03/19/20: ab set, bridge, clam with RTB 3/2 hip abduction and extension,  tandem stance 3/7 SLS 3/9 STS 3/14 SLS with vectors3/16 piriformis stretch    Consulted and Agree with Plan of Care Patient           Patient will benefit from skilled therapeutic intervention in order to improve the following deficits and impairments:  Postural dysfunction,Pain,Decreased activity tolerance,Decreased strength,Impaired perceived functional ability,Improper body mechanics,Impaired sensation,Abnormal gait,Decreased balance,Decreased mobility  Visit Diagnosis: Low back pain, unspecified back pain laterality, unspecified chronicity, unspecified whether sciatica present  Muscle weakness (generalized)  Other abnormalities of gait and mobility  Other symptoms and signs involving the musculoskeletal system     Problem List Patient Active Problem List   Diagnosis Date Noted  . Myelopathy concurrent with and due to stenosis of lumbar spine ([place]) 02/25/2020  . Hypoalbuminemia due to protein-calorie malnutrition ([place])   . Muscle spasm   . Controlled type 2 diabetes mellitus with hyperglycemia, without long-term current use of insulin ([place])   . Essential hypertension   . Drug induced constipation   . Postoperative pain   . Lumbar disc herniation with myelopathy 02/24/2020  . Left foot drop 02/14/2020  . Lumbar spinal stenosis 02/14/2020  . Lumbar radiculopathy 02/13/2020  . Acute right-sided back pain with sciatica 10/02/2019  . S/P total knee replacement, right 07/22/19  09/01/2019  . Primary localized osteoarthritis of left knee 07/22/2019  . Primary osteoarthritis of right knee   . Constipation 09/05/2018  . Abdominal pain, chronic, epigastric 05/27/2018  . Dysphagia 05/27/2018  . Depression, major, single episode, moderate ([place]) 03/22/2018  . S/P complete hysterectomy 06/06/2017  . Derangement of posterior horn of medial meniscus of right knee   . Right knee pain 09/10/2015  .  Iron deficiency anemia 10/16/2013  . Fibroids 08/21/2013  . Excessive or frequent menstruation 06/19/2013  . Morbid obesity ([place]) 07/12/2012  . Obstructive sleep apnea 06/21/2012  . Chest pain   . Hyperlipidemia   . Type 2 diabetes mellitus ([place])   . Obesity   . Laboratory test   . Nephrolithiasis   . Asthma   . Endometriosis   . Hypertension   . Iron deficiency anemia 05/11/2009  . GERD 05/11/2009    9:11 AM, 04/07/20 Mearl Latin PT, DPT Physical Therapist at [place], [place], [address] Phone: [phone]   Fax:  [phone]  Name: GAGE TREIBER MRN: [id number] Date of Birth: [birth year]

## 2020-04-12 ENCOUNTER — Other Ambulatory Visit: Payer: Self-pay

## 2020-04-12 ENCOUNTER — Encounter: Payer: Self-pay | Admitting: Physical Medicine & Rehabilitation

## 2020-04-12 ENCOUNTER — Encounter: Payer: Medicare Other | Attending: Physical Medicine & Rehabilitation | Admitting: Physical Medicine & Rehabilitation

## 2020-04-12 VITALS — BP 117/79 | HR 104 | Temp 99.0°F | Ht 63.0 in | Wt 190.4 lb

## 2020-04-12 DIAGNOSIS — R269 Unspecified abnormalities of gait and mobility: Secondary | ICD-10-CM | POA: Diagnosis not present

## 2020-04-12 DIAGNOSIS — M792 Neuralgia and neuritis, unspecified: Secondary | ICD-10-CM | POA: Insufficient documentation

## 2020-04-12 DIAGNOSIS — Z9889 Other specified postprocedural states: Secondary | ICD-10-CM | POA: Diagnosis not present

## 2020-04-12 NOTE — Progress Notes (Signed)
Subjective:    Patient ID: Carrie Cook, female    DOB: 03-Dec-1960, 60 y.o.   MRN: [id number]  HPI Female with history of HTN, T2DM, gastric bypass, depression anxiety presents for follow up microdiscectomy and open decompression (L3-L5) secondary to Lumbar stenosis with myelopathy.   At discharge, she was instructed to follow up with PCP, which she did. She saw surgery as well. CBGs have been slightly elevated. Pain is manageable. Bowel movements are loose, but baseline. BP is controlled. Denies falls. She is in outpatient therapies. Cane in community.   Pain Inventory Average Pain 2 Pain Right Now 2 My pain is constant, burning, tingling and aching  In the last 24 hours, has pain interfered with the following? General activity 2 Relation with others 2 Enjoyment of life 2 What TIME of day is your pain at its worst? daytime and evening Sleep (in general) Fair  Pain is worse with: bending, sitting, inactivity and standing Pain improves with: rest and therapy/exercise Relief from Meds: 7  walk without assistance use a cane how many minutes can you walk? 10-15 mins ability to climb steps?  yes do you drive?  yes  disabled: date disabled 2012 I need assistance with the following:  household duties and shopping Do you have any goals in this area?  yes  weakness numbness tingling trouble walking confusion depression anxiety  Any changes since last visit?  no New Patient  Any changes since last visit?  no New Patient    Family History  Problem Relation Age of Onset  . Hypertension Father   . Hyperlipidemia Father   . COPD Father   . COPD Mother   . Heart disease Mother   . Cancer Mother        Cervix  . Anesthesia problems Mother        post-op N/V  . Thyroid disease Mother   . Diabetes Maternal Grandfather   . Thyroid disease Sister   . Anesthesia problems Sister        post-op N/V  . Thyroid disease Paternal Uncle   . Diabetes Other   . Colon  cancer Neg Hx   . Colon polyps Neg Hx    Social History   Socioeconomic History  . Marital status: Married    Spouse name: Herbie Baltimore  . Number of children: 1  . Years of education: Not on file  . Highest education level: Not on file  Occupational History  . Occupation: disability  Tobacco Use  . Smoking status: Never Smoker  . Smokeless tobacco: Never Used  Vaping Use  . Vaping Use: Never used  Substance and Sexual Activity  . Alcohol use: No  . Drug use: No  . Sexual activity: Not Currently    Birth control/protection: Surgical  Other Topics Concern  . Not on file  Social History Narrative  . Not on file   Social Determinants of Health   Financial Resource Strain: Not on file  Food Insecurity: Not on file  Transportation Needs: Not on file  Physical Activity: Not on file  Stress: Not on file  Social Connections: Not on file   Past Surgical History:  Procedure Laterality Date  . ABDOMINAL HYSTERECTOMY N/A 06/06/2017   Procedure: HYSTERECTOMY ABDOMINAL;  Surgeon: Florian Buff, MD;  [facility];  Service: Gynecology;  Laterality: N/A;  . BACK SURGERY    . BREATH TEK H PYLORI N/A 08/13/2012   Procedure: BREATH TEK H PYLORI;  Surgeon: Edward Jolly,  MD;  [facility];  Service: General;  Laterality: N/A;  . CARPAL TUNNEL RELEASE  01/03/2012   Procedure: CARPAL TUNNEL RELEASE;  Surgeon: Wynonia Sours, MD;  Location: [place];  Service: Orthopedics;  Laterality: Right;  . carpal tunnel release right hand Right 12/13  . COLONOSCOPY  05/2009   SJG:[id number] hemorrhoids otherwise normal colon, rectum and terminal ileum  . COLONOSCOPY WITH ESOPHAGOGASTRODUODENOSCOPY (EGD) N/A 10/28/2012   Procedure: COLONOSCOPY WITH ESOPHAGOGASTRODUODENOSCOPY (EGD);  Surgeon: Daneil Dolin, MD;  [facility];  Service: Endoscopy;  Laterality: N/A;  7:30  . DILITATION & CURRETTAGE/HYSTROSCOPY WITH NOVASURE ABLATION N/A 07/22/2014   Procedure:  DILATATION & CURETTAGE/HYSTEROSCOPY WITH NOVASURE ABLATION; uterine length 6.0 cm; uterine width 3.8 cm; total ablation time 1 minute 15 seconds;  Surgeon: Florian Buff, MD;  [facility];  Service: Gynecology;  Laterality: N/A;  . ESOPHAGOGASTRODUODENOSCOPY  5/013/2011   TML:YYTKPT esophagus/multiple polyps removed s/p (hyperplastic). Gastritis without H.pylori.  . ESOPHAGOGASTRODUODENOSCOPY (EGD) WITH PROPOFOL N/A 06/06/2018   with LA Grade A esophagitis s/p dilation.   Marland Kitchen GASTRIC ROUX-EN-Y N/A 11/19/2012   Procedure: LAPAROSCOPIC ROUX-EN-Y GASTRIC;  Surgeon: Edward Jolly, MD;  [facility];  Service: General;  Laterality: N/A;  . GIVENS CAPSULE STUDY N/A 10/28/2012   Procedure: GIVENS CAPSULE STUDY;  Surgeon: Daneil Dolin, MD;  [facility];  Service: Endoscopy;  Laterality: N/A;  . KNEE ARTHROSCOPY WITH MEDIAL MENISECTOMY Right 10/28/2015   Procedure: RIGHT KNEE ARTHROSCOPY WITH MEDIAL MENISECTOMY;  Surgeon: Carole Civil, MD;  [facility];  Service: Orthopedics;  Laterality: Right;  . LAPAROSCOPY     due to endometriosis  . LUMBAR LAMINECTOMY/DECOMPRESSION MICRODISCECTOMY N/A 02/14/2020   Procedure: LUMBAR LAMINECTOMY  Lumbar three-four, Lumbar four-five;  Surgeon: Consuella Lose, MD;  Location: [place];  Service: Neurosurgery;  Laterality: N/A;  . Venia Minks DILATION N/A 06/06/2018   Procedure: Venia Minks DILATION;  Surgeon: Daneil Dolin, MD;  [facility];  Service: Endoscopy;  Laterality: N/A;  . PELVIC LAPAROSCOPY  11/04/1999   with fulguration of endometriosis  . SALPINGOOPHORECTOMY Bilateral 06/06/2017   Procedure: SALPINGO OOPHORECTOMY;  Surgeon: Florian Buff, MD;  [facility];  Service: Gynecology;  Laterality: Bilateral;  . TOTAL KNEE ARTHROPLASTY Right 07/22/2019   Procedure: TOTAL KNEE ARTHROPLASTY;  Surgeon: Carole Civil, MD;  [facility];  Service: Orthopedics;  Laterality: Right;  . TRIGGER FINGER RELEASE Right  09/24/2012   Procedure: RELEASE A-1 PULLEY OF RIGHT THUMB;  Surgeon: Wynonia Sours, MD;  Location: [place];  Service: Orthopedics;  Laterality: Right;  . TUBAL LIGATION  1993   Past Medical History:  Diagnosis Date  . Anemia    PT HAS HAD COLONOSCOPY AND ENDOSCOPY WORK UP - NO PROBLEMS FOUND AS SOURCE OF ANEMIA -- PT HAD IRON INFUSION AT [facility] 11/13/12-AFTER SEEING HEMATOLOGIST DR. [employer] AND HE GAVE HEMATOLOGIC CLEARANCE FOR GASTRIC BYPASS SURGERY.  Marland Kitchen Anxiety   . Asthma    daily and prn inhalers  . Degenerative joint disease    right knee, spine - STATES INTERMITTENT  NUMBNESS DOWN RT LEG WITH PROLONGED STANDING OR WALKING- THINKS RELATED TO HER SPINE PROBLEMS  . Dental crowns present   . Depression   . Family history of anesthesia complication    pt's mother and sister have hx. of post-op N/V  . Fibroids    UTERINE  . Gastroesophageal reflux disease    none for 2 years  since Bariatric surgery.  . H/O hiatal hernia   . History of endometriosis   . History of kidney stones   . Hyperlipidemia   . Hypertension    under control with med., has been on med. x 2 yr.  . Insulin dependent diabetes mellitus   . Lock jaw    jaw locks open if opens mouth wide  . Migraines   . Neuropathy    FEET  . Obesity   . Palpitations   . PONV (postoperative nausea and vomiting)   . Shortness of breath    with daily activities  . Sleep apnea    post-op didn't need CPAP but now states needs it again but not using   BP 117/79   Pulse (!) 104   Temp 99 F (37.2 C)   Ht 5\' 3"  (1.6 m)   Wt 190 lb 6.4 oz (86.4 kg)   LMP 07/10/2013   BMI 33.73 kg/m   Opioid Risk Score:   Fall Risk Score:  `1  Depression screen PHQ 2/9  Depression screen [facility] 2/9 04/12/2020 03/15/2020 05/15/2017 12/11/2013  Decreased Interest 1 1 3  0  Down, Depressed, Hopeless 1 1 3  0  PHQ - 2 Score 2 2 6  0  Altered sleeping 1 0 3 -  Tired, decreased energy 1 1 3  -  Change in appetite 2 1 3  -  Feeling  bad or failure about yourself  1 0 3 -  Trouble concentrating 1 0 3 -  Moving slowly or fidgety/restless 1 1 1  -  Suicidal thoughts 0 1 2 -  PHQ-9 Score 9 6 24  -  Difficult doing work/chores - Somewhat difficult Extremely dIfficult -  Some recent data might be hidden   Review of Systems  Constitutional: Positive for unexpected weight change.       Weight gain  Respiratory: Positive for apnea.   Gastrointestinal: Positive for diarrhea.  Endocrine:       Low & High blood glucose  Musculoskeletal: Positive for back pain and gait problem.       Pain in right lower back down to upper leg  Neurological: Positive for weakness and numbness.       Tingling  Psychiatric/Behavioral: Positive for confusion.       Depression, [place]  All other systems reviewed and are negative.      Objective:   Physical Exam  Constitutional: No distress . Vital signs reviewed. HENT: Normocephalic.  Atraumatic. Eyes: EOMI. No discharge. Cardiovascular: No JVD.   Respiratory: Normal effort.  No stridor.   GI: Non-distended.   Skin: Warm and dry.  Intact. Psych: Normal mood.  Normal behavior. Musc: No edema in extremities.  No tenderness in extremities. Neuro: Alert Motor: Right lower extremity: Hip flexion, knee extension 4+/5, ankle dorsiflexion 4+/5, improving Left lower extremity: Hip flexion, knee extension 4+/5, ankle dorsiflexion 4+/5 (slightly weaker than right) Sensation diminished in b/l feet.    Assessment & Plan:  Female with history of HTN, T2DM, gastric bypass, depression anxiety presents for follow up microdiscectomy and open decompression (L3-L5) secondary to Lumbar stenosis with myelopathy.   1.  B/L LE weakness, Left > Right with impaired function/ADLs/mobility s/p microdiscectomy and open decompression (L3-L5) secondary to Lumbar stenosis with myelopathy.   Continue therapies  Continue follow up with Neurosurg  2. Pain Management:              Weaned off Baclofen  Neurontin 400 mg qid             Continue Amitriptyline 10mg  HS  3. Gait abnormality  Cont cane for safety  Continue therapies  4. T2DM with hyperglycemia:   Slightly elevated per patient  Follow up with PCP regarding further changes

## 2020-04-13 ENCOUNTER — Encounter (HOSPITAL_COMMUNITY): Payer: Self-pay | Admitting: Physical Therapy

## 2020-04-13 ENCOUNTER — Ambulatory Visit (HOSPITAL_COMMUNITY): Payer: Medicare Other | Admitting: Physical Therapy

## 2020-04-13 DIAGNOSIS — M545 Low back pain, unspecified: Secondary | ICD-10-CM

## 2020-04-13 DIAGNOSIS — M6281 Muscle weakness (generalized): Secondary | ICD-10-CM

## 2020-04-13 DIAGNOSIS — R29898 Other symptoms and signs involving the musculoskeletal system: Secondary | ICD-10-CM

## 2020-04-13 DIAGNOSIS — R2689 Other abnormalities of gait and mobility: Secondary | ICD-10-CM

## 2020-04-13 NOTE — Patient Instructions (Signed)
Access Code: [email or web address] URL: [email or web address] Date: 04/13/2020 Prepared by: Mitzi Hansen Vontae Court  Exercises Standing Hip Hinge with Dowel - 1 x daily - 7 x weekly - 2 sets - 10 reps Box Lift from [employer] with Pelvic Floor Contraction - 1 x daily - 7 x weekly - 2 sets - 10 reps

## 2020-04-13 NOTE — Therapy (Signed)
[place], [place], [address] Phone: [phone]   Fax:  [phone]  Physical Therapy Treatment  Patient Details  Name: Carrie Cook MRN: [id number] Date of Birth: 12-20-1960 Referring Provider (PT): Ankit Lorie Phenix MD   Encounter Date: 04/13/2020   PT End of Session - 04/13/20 0830    Visit Number 9    Number of Visits 12    Date for PT Re-Evaluation 04/28/20    Authorization Type Medicare (add KX)    Progress Note Due on Visit 10    PT Start Time 0831    PT Stop Time 0909    PT Time Calculation (min) 38 min    Activity Tolerance Patient tolerated treatment well    Behavior During Therapy [facility] for tasks assessed/performed           Past Medical History:  Diagnosis Date  . Anemia    PT HAS HAD COLONOSCOPY AND ENDOSCOPY WORK UP - NO PROBLEMS FOUND AS SOURCE OF ANEMIA -- PT HAD IRON INFUSION AT [facility] 11/13/12-AFTER SEEING HEMATOLOGIST DR. Piermont AND HE GAVE HEMATOLOGIC CLEARANCE FOR GASTRIC BYPASS SURGERY.  Marland Kitchen Anxiety   . Asthma    daily and prn inhalers  . Degenerative joint disease    right knee, spine - STATES INTERMITTENT  NUMBNESS DOWN RT LEG WITH PROLONGED STANDING OR WALKING- THINKS RELATED TO HER SPINE PROBLEMS  . Dental crowns present   . Depression   . Family history of anesthesia complication    pt's mother and sister have hx. of post-op N/V  . Fibroids    UTERINE  . Gastroesophageal reflux disease    none for 2 years since Bariatric surgery.  . H/O hiatal hernia   . History of endometriosis   . History of kidney stones   . Hyperlipidemia   . Hypertension    under control with med., has been on med. x 2 yr.  . Insulin dependent diabetes mellitus   . Lock jaw    jaw locks open if opens mouth wide  . Migraines   . Neuropathy    FEET  . Obesity   . Palpitations   . PONV (postoperative nausea and vomiting)   . Shortness of breath    with daily activities  . Sleep apnea     post-op didn't need CPAP but now states needs it again but not using    Past Surgical History:  Procedure Laterality Date  . ABDOMINAL HYSTERECTOMY N/A 06/06/2017   Procedure: HYSTERECTOMY ABDOMINAL;  Surgeon: Florian Buff, MD;  Location: [facility];  Service: Gynecology;  Laterality: N/A;  . BACK SURGERY    . BREATH TEK H PYLORI N/A 08/13/2012   Procedure: BREATH TEK H PYLORI;  Surgeon: Edward Jolly, MD;  Location: [facility];  Service: General;  Laterality: N/A;  . CARPAL TUNNEL RELEASE  01/03/2012   Procedure: CARPAL TUNNEL RELEASE;  Surgeon: Wynonia Sours, MD;  Location: [facility];  Service: Orthopedics;  Laterality: Right;  . carpal tunnel release right hand Right 12/13  . COLONOSCOPY  05/2009   OLM:[id number] hemorrhoids otherwise normal colon, rectum and terminal ileum  . COLONOSCOPY WITH ESOPHAGOGASTRODUODENOSCOPY (EGD) N/A 10/28/2012   Procedure: COLONOSCOPY WITH ESOPHAGOGASTRODUODENOSCOPY (EGD);  Surgeon: Daneil Dolin, MD;  [facility];  Service: Endoscopy;  Laterality: N/A;  7:30  . DILITATION & CURRETTAGE/HYSTROSCOPY WITH NOVASURE ABLATION N/A 07/22/2014   Procedure: DILATATION & CURETTAGE/HYSTEROSCOPY WITH NOVASURE ABLATION; uterine length  6.0 cm; uterine width 3.8 cm; total ablation time 1 minute 15 seconds;  Surgeon: Florian Buff, MD;  Location: [facility];  Service: Gynecology;  Laterality: N/A;  . ESOPHAGOGASTRODUODENOSCOPY  5/013/2011   EGB:TDVVOH esophagus/multiple polyps removed s/p (hyperplastic). Gastritis without H.pylori.  . ESOPHAGOGASTRODUODENOSCOPY (EGD) WITH PROPOFOL N/A 06/06/2018   with LA Grade A esophagitis s/p dilation.   Marland Kitchen GASTRIC ROUX-EN-Y N/A 11/19/2012   Procedure: LAPAROSCOPIC ROUX-EN-Y GASTRIC;  Surgeon: Edward Jolly, MD;  Location: [facility];  Service: General;  Laterality: N/A;  . GIVENS CAPSULE STUDY N/A 10/28/2012   Procedure: GIVENS CAPSULE STUDY;  Surgeon: Daneil Dolin, MD;  [facility];  Service:  Endoscopy;  Laterality: N/A;  . KNEE ARTHROSCOPY WITH MEDIAL MENISECTOMY Right 10/28/2015   Procedure: RIGHT KNEE ARTHROSCOPY WITH MEDIAL MENISECTOMY;  Surgeon: Carole Civil, MD;  Location: [facility];  Service: Orthopedics;  Laterality: Right;  . LAPAROSCOPY     due to endometriosis  . LUMBAR LAMINECTOMY/DECOMPRESSION MICRODISCECTOMY N/A 02/14/2020   Procedure: LUMBAR LAMINECTOMY  Lumbar three-four, Lumbar four-five;  Surgeon: Consuella Lose, MD;  Location: [facility];  Service: Neurosurgery;  Laterality: N/A;  . Venia Minks DILATION N/A 06/06/2018   Procedure: Venia Minks DILATION;  Surgeon: Daneil Dolin, MD;  [facility];  Service: Endoscopy;  Laterality: N/A;  . PELVIC LAPAROSCOPY  11/04/1999   with fulguration of endometriosis  . SALPINGOOPHORECTOMY Bilateral 06/06/2017   Procedure: SALPINGO OOPHORECTOMY;  Surgeon: Florian Buff, MD;  Location: [facility];  Service: Gynecology;  Laterality: Bilateral;  . TOTAL KNEE ARTHROPLASTY Right 07/22/2019   Procedure: TOTAL KNEE ARTHROPLASTY;  Surgeon: Carole Civil, MD;  Location: [facility];  Service: Orthopedics;  Laterality: Right;  . TRIGGER FINGER RELEASE Right 09/24/2012   Procedure: RELEASE A-1 PULLEY OF RIGHT THUMB;  Surgeon: Wynonia Sours, MD;  Location: [facility];  Service: Orthopedics;  Laterality: Right;  . TUBAL LIGATION  1993    There were no vitals filed for this visit.   Subjective Assessment - 04/13/20 0832    Subjective Patient states her back and legs are doing a little better. She had follow up with hospital doc from inpatient rehab. She is able to drive to MD appointments now. Her home exercises are going alright.    Currently in Pain? Yes    Pain Score 2     Pain Location Back    Pain Orientation Lower    Pain Descriptors / Indicators Burning    Pain Onset More than a month ago                             [facility] Adult PT Treatment/Exercise - 04/13/20 0001      Lumbar Exercises:  Standing   Heel Raises 20 reps    Other Standing Lumbar Exercises step up 8 inch step with contralateral knee drive 2x 10 bilateral; lateral step down 2x 10 with eccentric control 6 inch step; box lifts 2x 10 small wood box; hip hinge 2x 10 with dowel; SLS with vectors 5x5 second holds bilateral                  PT Education - 04/13/20 0831    Education Details HEP, exercise mechanics, gradually increasing home activities, lumbar support with sitting    Person(s) Educated Patient    Methods Explanation;Demonstration    Comprehension Verbalized understanding;Returned demonstration  PT Short Term Goals - 04/05/20 0900      PT SHORT TERM GOAL #1   Title Patient will be independent with initial HEP and self-management strategies to improve functional outcomes    Time 3    Period Weeks    Status Achieved    Target Date 04/07/20      PT SHORT TERM GOAL #2   Title Patient will report at least 25% improvement in symptoms for improved quality of life.    Time 3    Period Weeks    Status On-going    Target Date 04/07/20             PT Long Term Goals - 04/05/20 0900      PT LONG TERM GOAL #1   Title Patient will report at least 75% improvement in symptoms for improved quality of life.    Time 6    Period Weeks    Status On-going      PT LONG TERM GOAL #2   Title Patient will improve FOTO score by at least 10 points in order to indicate improved tolerance to activity.    Time 6    Period Weeks    Status On-going      PT LONG TERM GOAL #3   Title Patient will be able to complete 5x STS in under 11.4 seconds in order to reduce the risk of falls.    Time 6    Period Weeks    Status On-going      PT LONG TERM GOAL #4   Title Patient will be able to ambulate at least 450 feet in 2MWT in order to demonstrate improved gait speed for community ambulation.    Time 6    Period Weeks    Status On-going                 Plan - 04/13/20 0831     Clinical Impression Statement Patient able to progress to increased step height with step with knee drive today but continues to require unlateral HHA for dynamic balance and impaired RLE strength. Patient with minimal dynamic knee valgus R>L with lateral step down secondary to impaired hip strength but is showing improving eccentric motor control. Patient shown lifting mechanics with small wood box and she is able to perform with good mechanics with fatigue noted in bilateral LE. Patient initially requiring verbal cueing and demonstration for hip hinge mechanics with good carry over and patient performing very well. Patient also educated on probable soreness. Patient will continue to benefit from skilled physical therapy in order to reduce impairment and improve function.    Personal Factors and Comorbidities Comorbidity 3+;Time since onset of injury/illness/exacerbation;Past/Current Experience;Fitness    Comorbidities Anxiety, Arthritis, Asthma, Back pain, BMI over 30,  Depression, Diabetes, High Blood Pressure, Prior Surgery    Examination-Activity Limitations Locomotion Level;Transfers;Squat;Bend;Sleep;Sit;Bed Mobility    Examination-Participation Restrictions Community Activity;Yard Work;Laundry;Cleaning;Shop;Volunteer    Stability/Clinical Decision Making Stable/Uncomplicated    Rehab Potential Fair    PT Frequency 2x / week    PT Duration 6 weeks    PT Treatment/Interventions ADLs/Self Care Home Management;Aquatic Therapy;Biofeedback;Cryotherapy;Electrical Stimulation;Moist Heat;DME Instruction;Gait training;Stair training;Functional mobility training;Therapeutic activities;Therapeutic exercise;Balance training;Neuromuscular re-education;Patient/family education;Manual techniques;Scar mobilization;Passive range of motion;Dry needling;Taping;Joint Manipulations;Orthotic Fit/Training    PT Next Visit Plan Glute and core strengthening with table exercise and progress to standing as able, continue  balance and gait training    PT Home Exercise Plan 03/19/20: ab set, bridge, clam with RTB 3/2  hip abduction and extension, tandem stance 3/7 SLS 3/9 STS 3/14 SLS with vectors3/16 piriformis stretch3/22 hip hinge, box lifts    Consulted and Agree with Plan of Care Patient           Patient will benefit from skilled therapeutic intervention in order to improve the following deficits and impairments:  Postural dysfunction,Pain,Decreased activity tolerance,Decreased strength,Impaired perceived functional ability,Improper body mechanics,Impaired sensation,Abnormal gait,Decreased balance,Decreased mobility  Visit Diagnosis: Low back pain, unspecified back pain laterality, unspecified chronicity, unspecified whether sciatica present  Muscle weakness (generalized)  Other abnormalities of gait and mobility  Other symptoms and signs involving the musculoskeletal system     Problem List Patient Active Problem List   Diagnosis Date Noted  . Status post lumbar spine surgery for decompression of spinal cord 04/12/2020  . Abnormality of gait 04/12/2020  . Neuropathic pain 04/12/2020  . Myelopathy concurrent with and due to stenosis of lumbar spine ([facility]) 02/25/2020  . Hypoalbuminemia due to protein-calorie malnutrition ([place])   . Muscle spasm   . Controlled type 2 diabetes mellitus with hyperglycemia, without long-term current use of insulin ([place])   . Essential hypertension   . Drug induced constipation   . Postoperative pain   . Lumbar disc herniation with myelopathy 02/24/2020  . Left foot drop 02/14/2020  . Lumbar spinal stenosis 02/14/2020  . Lumbar radiculopathy 02/13/2020  . Acute right-sided back pain with sciatica 10/02/2019  . S/P total knee replacement, right 07/22/19  09/01/2019  . Primary localized osteoarthritis of left knee 07/22/2019  . Primary osteoarthritis of right knee   . Constipation 09/05/2018  . Abdominal pain, chronic, epigastric 05/27/2018  . Dysphagia 05/27/2018   . Depression, major, single episode, moderate ([place]) 03/22/2018  . S/P complete hysterectomy 06/06/2017  . Derangement of posterior horn of medial meniscus of right knee   . Right knee pain 09/10/2015  . Iron deficiency anemia 10/16/2013  . Fibroids 08/21/2013  . Excessive or frequent menstruation 06/19/2013  . Morbid obesity ([place]) 07/12/2012  . Obstructive sleep apnea 06/21/2012  . Chest pain   . Hyperlipidemia   . Type 2 diabetes mellitus ([place])   . Obesity   . Laboratory test   . Nephrolithiasis   . Asthma   . Endometriosis   . Hypertension   . Iron deficiency anemia 05/11/2009  . GERD 05/11/2009    9:16 AM, 04/13/20 Mearl Latin PT, DPT Physical Therapist at [place], [place], [address] Phone: [phone]   Fax:  [phone]  Name: ESTEFANIA KAMIYA MRN: [id number] Date of Birth: [birth year]

## 2020-04-15 ENCOUNTER — Other Ambulatory Visit: Payer: Self-pay

## 2020-04-15 ENCOUNTER — Encounter (HOSPITAL_COMMUNITY): Payer: Self-pay | Admitting: Physical Therapy

## 2020-04-15 ENCOUNTER — Ambulatory Visit (HOSPITAL_COMMUNITY): Payer: Medicare Other | Admitting: Physical Therapy

## 2020-04-15 DIAGNOSIS — R2689 Other abnormalities of gait and mobility: Secondary | ICD-10-CM

## 2020-04-15 DIAGNOSIS — M6281 Muscle weakness (generalized): Secondary | ICD-10-CM

## 2020-04-15 DIAGNOSIS — M545 Low back pain, unspecified: Secondary | ICD-10-CM

## 2020-04-15 DIAGNOSIS — R29898 Other symptoms and signs involving the musculoskeletal system: Secondary | ICD-10-CM

## 2020-04-15 NOTE — Therapy (Signed)
[facility] [address] [place], [place], [address] Phone: [phone]   Fax:  [phone]  Physical Therapy Treatment/Progress Note/Discharge Summary  Patient Details  Name: Carrie Cook MRN: [id number] Date of Birth: 01/06/1961 Referring Provider (PT): Ankit Lorie Phenix MD   Encounter Date: 04/15/2020  Progress Note   Reporting Period 03/17/20 to 04/15/20   See note below for Objective Data and Assessment of Progress/Goals   PHYSICAL THERAPY DISCHARGE SUMMARY  Visits from Start of Care: 10  Current functional level related to goals / functional outcomes: See below   Remaining deficits: See below   Education / Equipment: See below  Plan: Patient agrees to discharge.  Patient goals were met. Patient is being discharged due to meeting the stated rehab goals.  ?????         PT End of Session - 04/15/20 0832    Visit Number 10    Number of Visits 12    Date for PT Re-Evaluation 04/28/20    Authorization Type Medicare (add KX)    Progress Note Due on Visit 20    PT Start Time 0832    PT Stop Time 0915    PT Time Calculation (min) 43 min    Activity Tolerance Patient tolerated treatment well    Behavior During Therapy [facility] for tasks assessed/performed           Past Medical History:  Diagnosis Date  . Anemia    PT HAS HAD COLONOSCOPY AND ENDOSCOPY WORK UP - NO PROBLEMS FOUND AS SOURCE OF ANEMIA -- PT HAD IRON INFUSION AT [facility] 11/13/12-AFTER SEEING HEMATOLOGIST DR. [place] AND HE GAVE HEMATOLOGIC CLEARANCE FOR GASTRIC BYPASS SURGERY.  Marland Kitchen Anxiety   . Asthma    daily and prn inhalers  . Degenerative joint disease    right knee, spine - STATES INTERMITTENT  NUMBNESS DOWN RT LEG WITH PROLONGED STANDING OR WALKING- THINKS RELATED TO HER SPINE PROBLEMS  . Dental crowns present   . Depression   . Family history of anesthesia complication    pt's mother and sister have hx. of post-op N/V  . Fibroids    UTERINE   . Gastroesophageal reflux disease    none for 2 years since Bariatric surgery.  . H/O hiatal hernia   . History of endometriosis   . History of kidney stones   . Hyperlipidemia   . Hypertension    under control with med., has been on med. x 2 yr.  . Insulin dependent diabetes mellitus   . Lock jaw    jaw locks open if opens mouth wide  . Migraines   . Neuropathy    FEET  . Obesity   . Palpitations   . PONV (postoperative nausea and vomiting)   . Shortness of breath    with daily activities  . Sleep apnea    post-op didn't need CPAP but now states needs it again but not using    Past Surgical History:  Procedure Laterality Date  . ABDOMINAL HYSTERECTOMY N/A 06/06/2017   Procedure: HYSTERECTOMY ABDOMINAL;  Surgeon: Florian Buff, MD;  Location: [facility];  Service: Gynecology;  Laterality: N/A;  . BACK SURGERY    . BREATH TEK H PYLORI N/A 08/13/2012   Procedure: BREATH TEK H PYLORI;  Surgeon: Edward Jolly, MD;  Location: [facility];  Service: General;  Laterality: N/A;  . CARPAL TUNNEL RELEASE  01/03/2012   Procedure: CARPAL TUNNEL RELEASE;  Surgeon: Wynonia Sours, MD;  Location: [facility];  Service: Orthopedics;  Laterality: Right;  . carpal tunnel release right hand Right 12/13  . COLONOSCOPY  05/2009   [id number]:[id number] hemorrhoids otherwise normal colon, rectum and terminal ileum  . COLONOSCOPY WITH ESOPHAGOGASTRODUODENOSCOPY (EGD) N/A 10/28/2012   Procedure: COLONOSCOPY WITH ESOPHAGOGASTRODUODENOSCOPY (EGD);  Surgeon: Daneil Dolin, MD;  [facility];  Service: Endoscopy;  Laterality: N/A;  7:30  . DILITATION & CURRETTAGE/HYSTROSCOPY WITH NOVASURE ABLATION N/A 07/22/2014   Procedure: DILATATION & CURETTAGE/HYSTEROSCOPY WITH NOVASURE ABLATION; uterine length 6.0 cm; uterine width 3.8 cm; total ablation time 1 minute 15 seconds;  Surgeon: Florian Buff, MD;  Location: [facility];  Service: Gynecology;  Laterality: N/A;  . ESOPHAGOGASTRODUODENOSCOPY   5/013/2011   JWJ:[id number] esophagus/multiple polyps removed s/p (hyperplastic). Gastritis without H.pylori.  . ESOPHAGOGASTRODUODENOSCOPY (EGD) WITH PROPOFOL N/A 06/06/2018   with LA Grade A esophagitis s/p dilation.   Marland Kitchen GASTRIC ROUX-EN-Y N/A 11/19/2012   Procedure: LAPAROSCOPIC ROUX-EN-Y GASTRIC;  Surgeon: Edward Jolly, MD;  Location: [facility];  Service: General;  Laterality: N/A;  . GIVENS CAPSULE STUDY N/A 10/28/2012   Procedure: GIVENS CAPSULE STUDY;  Surgeon: Daneil Dolin, MD;  [facility];  Service: Endoscopy;  Laterality: N/A;  . KNEE ARTHROSCOPY WITH MEDIAL MENISECTOMY Right 10/28/2015   Procedure: RIGHT KNEE ARTHROSCOPY WITH MEDIAL MENISECTOMY;  Surgeon: Carole Civil, MD;  Location: [facility];  Service: Orthopedics;  Laterality: Right;  . LAPAROSCOPY     due to endometriosis  . LUMBAR LAMINECTOMY/DECOMPRESSION MICRODISCECTOMY N/A 02/14/2020   Procedure: LUMBAR LAMINECTOMY  Lumbar three-four, Lumbar four-five;  Surgeon: Consuella Lose, MD;  Location: [facility];  Service: Neurosurgery;  Laterality: N/A;  . Venia Minks DILATION N/A 06/06/2018   Procedure: Venia Minks DILATION;  Surgeon: Daneil Dolin, MD;  [facility];  Service: Endoscopy;  Laterality: N/A;  . PELVIC LAPAROSCOPY  11/04/1999   with fulguration of endometriosis  . SALPINGOOPHORECTOMY Bilateral 06/06/2017   Procedure: SALPINGO OOPHORECTOMY;  Surgeon: Florian Buff, MD;  Location: [facility];  Service: Gynecology;  Laterality: Bilateral;  . TOTAL KNEE ARTHROPLASTY Right 07/22/2019   Procedure: TOTAL KNEE ARTHROPLASTY;  Surgeon: Carole Civil, MD;  Location: [facility];  Service: Orthopedics;  Laterality: Right;  . TRIGGER FINGER RELEASE Right 09/24/2012   Procedure: RELEASE A-1 PULLEY OF RIGHT THUMB;  Surgeon: Wynonia Sours, MD;  Location: [facility];  Service: Orthopedics;  Laterality: Right;  . TUBAL LIGATION  1993    There were no vitals filed for this visit.   Subjective Assessment -  04/15/20 0834    Subjective Patient has been working on her home exercises. She was sore in her thighs from last session. Patient states 80% improvement with physical therapy intervention. She continues to feel limited with walking on uneven surfaces, endurance, lifting, squatting. She is thinking she may be ready to transition to self care next week.    Currently in Pain? Yes    Pain Score 2     Pain Location Back    Pain Orientation Lower    Pain Descriptors / Indicators Burning    Pain Type Surgical pain    Pain Onset More than a month ago    Pain Frequency Constant              [facility] PT Assessment - 04/15/20 0001      Assessment   Medical Diagnosis Myelopathy lumbar region    Referring Provider (PT) Ankit Lorie Phenix MD  Onset Date/Surgical Date 02/14/20    Prior Therapy yes back      Precautions   Precautions Back      Restrictions   Weight Bearing Restrictions No      Balance Screen   Has the patient fallen in the past 6 months Yes    How many times? 1    Has the patient had a decrease in activity level because of a fear of falling?  Yes    Is the patient reluctant to leave their home because of a fear of falling?  No      Prior Function   Level of Independence Independent with household mobility with device    Vocation On disability      Cognition   Overall Cognitive Status Within Functional Limits for tasks assessed      Observation/Other Assessments   Observations Ambulates without AD    Focus on Therapeutic Outcomes (FOTO)  61% function      Strength   Right Hip Flexion 4+/5    Right Hip Extension 4+/5    Right Hip ABduction 4+/5    Left Hip Extension 4+/5    Left Hip ABduction 4+/5    Right Knee Flexion 5/5    Right Knee Extension 5/5    Left Knee Flexion 5/5    Left Knee Extension 5/5    Right Ankle Dorsiflexion 4+/5    Left Ankle Dorsiflexion 4+/5      Transfers   Five time sit to stand comments  8.33 seconds without UE use       Ambulation/Gait   Ambulation/Gait Yes    Ambulation/Gait Assistance 7: Independent    Ambulation Distance (Feet) 575 Feet    Assistive device None    Gait Comments 2MWT                                 PT Education - 04/15/20 [phone]    Education Details HEP, exercise mechanics, walking program, how to progress walking/exercises, increasing overall activity, returning to PT if needed    Person(s) Educated Patient    Methods Explanation;Demonstration;Handout    Comprehension Verbalized understanding;Returned demonstration            PT Short Term Goals - 04/15/20 0842      PT SHORT TERM GOAL #1   Title Patient will be independent with initial HEP and self-management strategies to improve functional outcomes    Time 3    Period Weeks    Status Achieved    Target Date 04/07/20      PT SHORT TERM GOAL #2   Title Patient will report at least 25% improvement in symptoms for improved quality of life.    Time 3    Period Weeks    Status Achieved    Target Date 04/07/20             PT Long Term Goals - 04/15/20 6803      PT LONG TERM GOAL #1   Title Patient will report at least 75% improvement in symptoms for improved quality of life.    Time 6    Period Weeks    Status Achieved      PT LONG TERM GOAL #2   Title Patient will improve FOTO score by at least 10 points in order to indicate improved tolerance to activity.    Time 6    Period Weeks    Status  Achieved      PT LONG TERM GOAL #3   Title Patient will be able to complete 5x STS in under 11.4 seconds in order to reduce the risk of falls.    Time 6    Period Weeks    Status Achieved      PT LONG TERM GOAL #4   Title Patient will be able to ambulate at least 450 feet in 2MWT in order to demonstrate improved gait speed for community ambulation.    Time 6    Period Weeks    Status Achieved                 Plan - 04/15/20 0347    Clinical Impression Statement Patient has met all  short and long term goals with ability to complete HEP and improved symptoms, gait, strength, balance, activity tolerance, and functional mobility. Patient remains limited by minimally impaired strength which is functional and decreased endurance with ADL. Patient ready to transition to self management with HEP and feels she has the necessary skills to progress. Reviewed HEP with patient and educated her on beginning a walking program and how to progress. Patient discharged from physical therapy at this time.    Personal Factors and Comorbidities Comorbidity 3+;Time since onset of injury/illness/exacerbation;Past/Current Experience;Fitness    Comorbidities Anxiety, Arthritis, Asthma, Back pain, BMI over 30,  Depression, Diabetes, High Blood Pressure, Prior Surgery    Examination-Activity Limitations Locomotion Level;Transfers;Squat;Bend;Sleep;Sit;Bed Mobility    Examination-Participation Restrictions Community Activity;Yard Work;Laundry;Cleaning;Shop;Volunteer    Stability/Clinical Decision Making Stable/Uncomplicated    Rehab Potential Fair    PT Frequency --    PT Duration --    PT Treatment/Interventions ADLs/Self Care Home Management;Aquatic Therapy;Biofeedback;Cryotherapy;Electrical Stimulation;Moist Heat;DME Instruction;Gait training;Stair training;Functional mobility training;Therapeutic activities;Therapeutic exercise;Balance training;Neuromuscular re-education;Patient/family education;Manual techniques;Scar mobilization;Passive range of motion;Dry needling;Taping;Joint Manipulations;Orthotic Fit/Training    PT Next Visit Plan n/a    PT Home Exercise Plan 03/19/20: ab set, bridge, clam with RTB 3/2 hip abduction and extension, tandem stance 3/7 SLS 3/9 STS 3/14 SLS with vectors3/16 piriformis stretch3/22 hip hinge, box lifts 3/24 row, extension, step up with knee drive, lateral step down, palof    Consulted and Agree with Plan of Care Patient           Patient will benefit from skilled  therapeutic intervention in order to improve the following deficits and impairments:  Postural dysfunction,Pain,Decreased activity tolerance,Decreased strength,Impaired perceived functional ability,Improper body mechanics,Impaired sensation,Abnormal gait,Decreased balance,Decreased mobility  Visit Diagnosis: Low back pain, unspecified back pain laterality, unspecified chronicity, unspecified whether sciatica present  Muscle weakness (generalized)  Other abnormalities of gait and mobility  Other symptoms and signs involving the musculoskeletal system     Problem List Patient Active Problem List   Diagnosis Date Noted  . Status post lumbar spine surgery for decompression of spinal cord 04/12/2020  . Abnormality of gait 04/12/2020  . Neuropathic pain 04/12/2020  . Myelopathy concurrent with and due to stenosis of lumbar spine ([place]) 02/25/2020  . Hypoalbuminemia due to protein-calorie malnutrition ([place])   . Muscle spasm   . Controlled type 2 diabetes mellitus with hyperglycemia, without long-term current use of insulin ([place])   . Essential hypertension   . Drug induced constipation   . Postoperative pain   . Lumbar disc herniation with myelopathy 02/24/2020  . Left foot drop 02/14/2020  . Lumbar spinal stenosis 02/14/2020  . Lumbar radiculopathy 02/13/2020  . Acute right-sided back pain with sciatica 10/02/2019  . S/P total knee replacement, right 07/22/19  09/01/2019  . Primary localized osteoarthritis of left knee 07/22/2019  . Primary osteoarthritis of right knee   . Constipation 09/05/2018  . Abdominal pain, chronic, epigastric 05/27/2018  . Dysphagia 05/27/2018  . Depression, major, single episode, moderate ([facility]) 03/22/2018  . S/P complete hysterectomy 06/06/2017  . Derangement of posterior horn of medial meniscus of right knee   . Right knee pain 09/10/2015  . Iron deficiency anemia 10/16/2013  . Fibroids 08/21/2013  . Excessive or frequent menstruation 06/19/2013  .  Morbid obesity ([place]) 07/12/2012  . Obstructive sleep apnea 06/21/2012  . Chest pain   . Hyperlipidemia   . Type 2 diabetes mellitus ([place])   . Obesity   . Laboratory test   . Nephrolithiasis   . Asthma   . Endometriosis   . Hypertension   . Iron deficiency anemia 05/11/2009  . GERD 05/11/2009    9:30 AM, 04/15/20 Mearl Latin PT, DPT Physical Therapist at [facility] [place], [place], [address] Phone: [phone]   Fax:  [phone]  Name: Carrie Cook MRN: [id number] Date of Birth: Jun 16, 1960

## 2020-04-15 NOTE — Patient Instructions (Signed)
Access Code: [email or web address] URL: [email or web address] Date: 04/15/2020 Prepared by: Mitzi Hansen Tymir Terral  Exercises Runner's Step Up/Down - 1 x daily - 7 x weekly - 2 sets - 10 reps Lateral Step Down - 1 x daily - 7 x weekly - 2 sets - 10 reps Standing Shoulder Row with Anchored Resistance - 1 x daily - 7 x weekly - 2 sets - 15 reps Shoulder extension with resistance - Neutral - 1 x daily - 7 x weekly - 2 sets - 15 reps Standing Anti-Rotation Press with Anchored Resistance - 1 x daily - 7 x weekly - 2 sets - 15 reps

## 2020-04-20 ENCOUNTER — Encounter (HOSPITAL_COMMUNITY): Payer: Medicare Other | Admitting: Physical Therapy

## 2020-04-22 ENCOUNTER — Encounter (HOSPITAL_COMMUNITY): Payer: Medicare Other | Admitting: Physical Therapy

## 2020-04-26 ENCOUNTER — Other Ambulatory Visit: Payer: Self-pay

## 2020-04-26 ENCOUNTER — Encounter: Payer: Self-pay | Admitting: Family Medicine

## 2020-04-26 ENCOUNTER — Encounter (HOSPITAL_COMMUNITY): Payer: Medicare Other | Admitting: Physical Therapy

## 2020-04-26 ENCOUNTER — Ambulatory Visit (INDEPENDENT_AMBULATORY_CARE_PROVIDER_SITE_OTHER): Payer: Medicare Other | Admitting: Family Medicine

## 2020-04-26 VITALS — BP 130/78 | Temp 97.5°F | Wt 190.4 lb

## 2020-04-26 DIAGNOSIS — I10 Essential (primary) hypertension: Secondary | ICD-10-CM | POA: Diagnosis not present

## 2020-04-26 DIAGNOSIS — E7849 Other hyperlipidemia: Secondary | ICD-10-CM

## 2020-04-26 DIAGNOSIS — Z79899 Other long term (current) drug therapy: Secondary | ICD-10-CM | POA: Diagnosis not present

## 2020-04-26 DIAGNOSIS — E119 Type 2 diabetes mellitus without complications: Secondary | ICD-10-CM

## 2020-04-26 DIAGNOSIS — Z794 Long term (current) use of insulin: Secondary | ICD-10-CM

## 2020-04-26 DIAGNOSIS — D509 Iron deficiency anemia, unspecified: Secondary | ICD-10-CM

## 2020-04-26 NOTE — Progress Notes (Signed)
Subjective:    Patient ID: Carrie Cook, female    DOB: [birth year], 60 y.o.   MRN: [id number]  HPI Pt here for follow up from 03/15/20 visit. Pt states she is doing well. Blood pressure has been doing well. Back is better; doesn't have to walk with a walker.  Essential hypertension - Plan: Hemoglobin A1c, Lipid Profile, Urine Microalbumin w/creat. ratio, CBC with Differential, Ferritin, Iron Binding Cap (TIBC)([facility])  Type 2 diabetes mellitus without complication, with long-term current use of insulin (HCC) - Plan: Hemoglobin A1c, Lipid Profile, Urine Microalbumin w/creat. ratio, CBC with Differential, Ferritin, Iron Binding Cap (TIBC)([facility])  Iron deficiency anemia, unspecified iron deficiency anemia type - Plan: Hemoglobin A1c, Lipid Profile, Urine Microalbumin w/creat. ratio, CBC with Differential, Ferritin, Iron Binding Cap (TIBC)([facility])  High risk medication use - Plan: Hemoglobin A1c, Lipid Profile, Urine Microalbumin w/creat. ratio, CBC with Differential, Ferritin, Iron Binding Cap (TIBC)([facility])  Other hyperlipidemia - Plan: Hemoglobin A1c, Lipid Profile, Urine Microalbumin w/creat. ratio, CBC with Differential, Ferritin, Iron Binding Cap (TIBC)([facility])  She does have a history of iron deficient anemia finds her self feeling somewhat fatigued tired rundown. She states that hematology want her to recheck lab work this spring that is something we can help order for her She states her diabetes been doing well she is trying to watch starches in her diet take her medicines and stay active Her legs are getting stronger not having to use the walker no falls recently  Review of Systems  Constitutional: Negative for activity change and appetite change.  HENT: Negative for congestion and rhinorrhea.   Respiratory: Negative for cough and shortness of breath.   Cardiovascular: Negative for chest pain and leg swelling.  Gastrointestinal:  Negative for abdominal pain, nausea and vomiting.  Skin: Negative for color change.  Neurological: Negative for dizziness and weakness.  Psychiatric/Behavioral: Negative for agitation and confusion.       Objective:   Physical Exam Vitals reviewed.  Constitutional:      General: She is not in acute distress. HENT:     Head: Normocephalic.  Cardiovascular:     Rate and Rhythm: Normal rate and regular rhythm.     Heart sounds: Normal heart sounds. No murmur heard.   Pulmonary:     Effort: Pulmonary effort is normal.     Breath sounds: Normal breath sounds.  Lymphadenopathy:     Cervical: No cervical adenopathy.  Neurological:     Mental Status: She is alert.  Psychiatric:        Behavior: Behavior normal.     I observed the patient walking.  She does not have any ataxia.  Strength good bilateral.  Able to walk without walker very well.  No foot drop on physical exam she still has some weakness in the right extensor of the great toe but otherwise doing much better      Assessment & Plan:  1. Essential hypertension Blood pressure very good continue current measures. - Hemoglobin A1c - Lipid Profile - Urine Microalbumin w/creat. ratio - CBC with Differential - Ferritin - Iron Binding Cap (TIBC)([facility])  2. Type 2 diabetes mellitus without complication, with long-term current use of insulin (HCC) Watch portions minimize starches check lab work at the end of April continue current measures - Hemoglobin A1c - Lipid Profile - Urine Microalbumin w/creat. ratio - CBC with Differential - Ferritin - Iron Binding Cap (TIBC)([facility])  3. Iron deficiency anemia, unspecified iron deficiency anemia type Has had iron infusion in the past  patient feels fatigue tiredness we will check CBC and ferritin - Hemoglobin A1c - Lipid Profile - Urine Microalbumin w/creat. ratio - CBC with Differential - Ferritin - Iron Binding Cap (TIBC)([facility])  4. High  risk medication use See above - Hemoglobin A1c - Lipid Profile - Urine Microalbumin w/creat. ratio - CBC with Differential - Ferritin - Iron Binding Cap (TIBC)([facility])  5. Other hyperlipidemia Cholesterol profile follow-up again by August September - Hemoglobin A1c - Lipid Profile - Urine Microalbumin w/creat. ratio - CBC with Differential - Ferritin - Iron Binding Cap (TIBC)([facility])

## 2020-04-28 ENCOUNTER — Encounter (HOSPITAL_COMMUNITY): Payer: Medicare Other | Admitting: Physical Therapy

## 2020-05-06 ENCOUNTER — Other Ambulatory Visit: Payer: Self-pay | Admitting: Family Medicine

## 2020-06-11 DIAGNOSIS — I10 Essential (primary) hypertension: Secondary | ICD-10-CM | POA: Diagnosis not present

## 2020-06-11 DIAGNOSIS — E119 Type 2 diabetes mellitus without complications: Secondary | ICD-10-CM | POA: Diagnosis not present

## 2020-06-11 DIAGNOSIS — E7849 Other hyperlipidemia: Secondary | ICD-10-CM | POA: Diagnosis not present

## 2020-06-11 DIAGNOSIS — Z794 Long term (current) use of insulin: Secondary | ICD-10-CM | POA: Diagnosis not present

## 2020-06-11 DIAGNOSIS — D509 Iron deficiency anemia, unspecified: Secondary | ICD-10-CM | POA: Diagnosis not present

## 2020-06-11 DIAGNOSIS — Z79899 Other long term (current) drug therapy: Secondary | ICD-10-CM | POA: Diagnosis not present

## 2020-06-12 LAB — CBC WITH DIFFERENTIAL/PLATELET
Basophils Absolute: 0.1 10*3/uL (ref 0.0–0.2)
Basos: 1 %
EOS (ABSOLUTE): 0.3 10*3/uL (ref 0.0–0.4)
Eos: 5 %
Hematocrit: 44.6 % (ref 34.0–46.6)
Hemoglobin: 14.9 g/dL (ref 11.1–15.9)
Immature Grans (Abs): 0 10*3/uL (ref 0.0–0.1)
Immature Granulocytes: 0 %
Lymphocytes Absolute: 2.3 10*3/uL (ref 0.7–3.1)
Lymphs: 32 %
MCH: 28.4 pg (ref 26.6–33.0)
MCHC: 33.4 g/dL (ref 31.5–35.7)
MCV: 85 fL (ref 79–97)
Monocytes Absolute: 0.7 10*3/uL (ref 0.1–0.9)
Monocytes: 9 %
Neutrophils Absolute: 3.9 10*3/uL (ref 1.4–7.0)
Neutrophils: 53 %
Platelets: 304 10*3/uL (ref 150–450)
RBC: 5.24 x10E6/uL (ref 3.77–5.28)
RDW: 11.6 % — ABNORMAL LOW (ref 11.7–15.4)
WBC: 7.3 10*3/uL (ref 3.4–10.8)

## 2020-06-12 LAB — LIPID PANEL
Chol/HDL Ratio: 3.4 ratio (ref 0.0–4.4)
Cholesterol, Total: 181 mg/dL (ref 100–199)
HDL: 53 mg/dL (ref >39)
LDL Chol Calc (NIH): 99 mg/dL (ref 0–99)
Triglycerides: 169 mg/dL — ABNORMAL HIGH (ref 0–149)
VLDL Cholesterol Cal: 29 mg/dL (ref 5–40)

## 2020-06-12 LAB — HEMOGLOBIN A1C
Est. average glucose Bld gHb Est-mCnc: 157 mg/dL
Hgb A1c MFr Bld: 7.1 % — ABNORMAL HIGH (ref 4.8–5.6)

## 2020-06-12 LAB — MICROALBUMIN / CREATININE URINE RATIO
Creatinine, Urine: 149 mg/dL
Microalb/Creat Ratio: 9 mg/g creat (ref 0–29)
Microalbumin, Urine: 13.6 ug/mL

## 2020-06-12 LAB — IRON AND TIBC
Iron Saturation: 19 % (ref 15–55)
Iron: 60 ug/dL (ref 27–159)
Total Iron Binding Capacity: 324 ug/dL (ref 250–450)
UIBC: 264 ug/dL (ref 131–425)

## 2020-06-12 LAB — FERRITIN: Ferritin: 19 ng/mL (ref 15–150)

## 2020-06-14 MED ORDER — ROSUVASTATIN CALCIUM 10 MG PO TABS: 10.0000 mg | ORAL_TABLET | Freq: Every day | ORAL | 0 refills | Status: DC

## 2020-06-14 NOTE — Addendum Note (Signed)
Addended by: Dairl Ponder on: 06/14/2020 10:46 AM   Modules accepted: Orders

## 2020-06-14 NOTE — Addendum Note (Signed)
Addended by: Dairl Ponder on: 06/14/2020 02:32 PM   Modules accepted: Orders

## 2020-06-15 ENCOUNTER — Encounter: Payer: Self-pay | Admitting: Physical Medicine & Rehabilitation

## 2020-06-15 ENCOUNTER — Other Ambulatory Visit: Payer: Self-pay

## 2020-06-15 ENCOUNTER — Encounter: Payer: Medicare Other | Attending: Physical Medicine & Rehabilitation | Admitting: Physical Medicine & Rehabilitation

## 2020-06-15 VITALS — BP 126/84 | HR 98 | Temp 98.8°F | Ht 63.0 in | Wt 193.8 lb

## 2020-06-15 DIAGNOSIS — M792 Neuralgia and neuritis, unspecified: Secondary | ICD-10-CM | POA: Insufficient documentation

## 2020-06-15 DIAGNOSIS — R269 Unspecified abnormalities of gait and mobility: Secondary | ICD-10-CM | POA: Diagnosis not present

## 2020-06-15 DIAGNOSIS — Z9889 Other specified postprocedural states: Secondary | ICD-10-CM | POA: Insufficient documentation

## 2020-06-15 MED ORDER — AMITRIPTYLINE HCL 10 MG PO TABS: 10.0000 mg | ORAL_TABLET | Freq: Every day | ORAL | 5 refills | Status: DC

## 2020-06-15 NOTE — Progress Notes (Signed)
Subjective:    Patient ID: Carrie Cook, female    DOB: 1960-02-08, 60 y.o.   MRN: [id number]  HPI Female with history of HTN, T2DM, gastric bypass, depression anxiety presents for follow up microdiscectomy and open decompression (L3-L5) secondary to Lumbar stenosis with myelopathy.   Last clinic visit on 04/12/20.  Since that time, pt states she completed therapies. She described pain as soreness, is not requiring even OTC meds. Denies falls. She sees surgery in 2+ months.   Pain Inventory Average Pain 1 Pain Right Now 1 My pain is constant, burning, tingling and aching  In the last 24 hours, has pain interfered with the following? General activity 1 Relation with others 1 Enjoyment of life 1 What TIME of day is your pain at its worst? evening Sleep (in general) Fair  Pain is worse with: inactivity Pain improves with: rest Relief from Meds: Not taking medication for pain.  walk without assistance use a cane how many minutes can you walk? 10-15 mins ability to climb steps?  yes do you drive?  yes  disabled: date disabled 2012 I need assistance with the following:  household duties and shopping Do you have any goals in this area?  yes  weakness numbness tingling trouble walking confusion depression anxiety    Family History  Problem Relation Age of Onset  . Hypertension Father   . Hyperlipidemia Father   . COPD Father   . COPD Mother   . Heart disease Mother   . Cancer Mother        Cervix  . Anesthesia problems Mother        post-op N/V  . Thyroid disease Mother   . Diabetes Maternal Grandfather   . Thyroid disease Sister   . Anesthesia problems Sister        post-op N/V  . Thyroid disease Paternal Uncle   . Diabetes Other   . Colon cancer Neg Hx   . Colon polyps Neg Hx    Social History   Socioeconomic History  . Marital status: Married    Spouse name: Herbie Baltimore  . Number of children: 1  . Years of education: Not on file  . Highest education  level: Not on file  Occupational History  . Occupation: disability  Tobacco Use  . Smoking status: Never Smoker  . Smokeless tobacco: Never Used  Vaping Use  . Vaping Use: Never used  Substance and Sexual Activity  . Alcohol use: No  . Drug use: No  . Sexual activity: Not Currently    Birth control/protection: Surgical  Other Topics Concern  . Not on file  Social History Narrative  . Not on file   Social Determinants of Health   Financial Resource Strain: Not on file  Food Insecurity: Not on file  Transportation Needs: Not on file  Physical Activity: Not on file  Stress: Not on file  Social Connections: Not on file   Past Surgical History:  Procedure Laterality Date  . ABDOMINAL HYSTERECTOMY N/A 06/06/2017   Procedure: HYSTERECTOMY ABDOMINAL;  Surgeon: Florian Buff, MD;  Location: [facility];  Service: Gynecology;  Laterality: N/A;  . BACK SURGERY    . BREATH TEK H PYLORI N/A 08/13/2012   Procedure: BREATH TEK H PYLORI;  Surgeon: Edward Jolly, MD;  Location: [facility];  Service: General;  Laterality: N/A;  . CARPAL TUNNEL RELEASE  01/03/2012   Procedure: CARPAL TUNNEL RELEASE;  Surgeon: Wynonia Sours, MD;  Location: [place];  Service: Orthopedics;  Laterality: Right;  . carpal tunnel release right hand Right 12/13  . COLONOSCOPY  05/2009   POE:UMPNTIRW hemorrhoids otherwise normal colon, rectum and terminal ileum  . COLONOSCOPY WITH ESOPHAGOGASTRODUODENOSCOPY (EGD) N/A 10/28/2012   Procedure: COLONOSCOPY WITH ESOPHAGOGASTRODUODENOSCOPY (EGD);  Surgeon: Daneil Dolin, MD;  [facility];  Service: Endoscopy;  Laterality: N/A;  7:30  . DILITATION & CURRETTAGE/HYSTROSCOPY WITH NOVASURE ABLATION N/A 07/22/2014   Procedure: DILATATION & CURETTAGE/HYSTEROSCOPY WITH NOVASURE ABLATION; uterine length 6.0 cm; uterine width 3.8 cm; total ablation time 1 minute 15 seconds;  Surgeon: Florian Buff, MD;  Location: [facility];  Service: Gynecology;  Laterality:  N/A;  . ESOPHAGOGASTRODUODENOSCOPY  5/013/2011   ERX:VQMGQQ esophagus/multiple polyps removed s/p (hyperplastic). Gastritis without H.pylori.  . ESOPHAGOGASTRODUODENOSCOPY (EGD) WITH PROPOFOL N/A 06/06/2018   with LA Grade A esophagitis s/p dilation.   Marland Kitchen GASTRIC ROUX-EN-Y N/A 11/19/2012   Procedure: LAPAROSCOPIC ROUX-EN-Y GASTRIC;  Surgeon: Edward Jolly, MD;  Location: [facility];  Service: General;  Laterality: N/A;  . GIVENS CAPSULE STUDY N/A 10/28/2012   Procedure: GIVENS CAPSULE STUDY;  Surgeon: Daneil Dolin, MD;  [facility];  Service: Endoscopy;  Laterality: N/A;  . KNEE ARTHROSCOPY WITH MEDIAL MENISECTOMY Right 10/28/2015   Procedure: RIGHT KNEE ARTHROSCOPY WITH MEDIAL MENISECTOMY;  Surgeon: Carole Civil, MD;  Location: [facility];  Service: Orthopedics;  Laterality: Right;  . LAPAROSCOPY     due to endometriosis  . LUMBAR LAMINECTOMY/DECOMPRESSION MICRODISCECTOMY N/A 02/14/2020   Procedure: LUMBAR LAMINECTOMY  Lumbar three-four, Lumbar four-five;  Surgeon: Consuella Lose, MD;  Location: [facility];  Service: Neurosurgery;  Laterality: N/A;  . Venia Minks DILATION N/A 06/06/2018   Procedure: Venia Minks DILATION;  Surgeon: Daneil Dolin, MD;  [facility];  Service: Endoscopy;  Laterality: N/A;  . PELVIC LAPAROSCOPY  11/04/1999   with fulguration of endometriosis  . SALPINGOOPHORECTOMY Bilateral 06/06/2017   Procedure: SALPINGO OOPHORECTOMY;  Surgeon: Florian Buff, MD;  Location: [facility];  Service: Gynecology;  Laterality: Bilateral;  . TOTAL KNEE ARTHROPLASTY Right 07/22/2019   Procedure: TOTAL KNEE ARTHROPLASTY;  Surgeon: Carole Civil, MD;  Location: [facility];  Service: Orthopedics;  Laterality: Right;  . TRIGGER FINGER RELEASE Right 09/24/2012   Procedure: RELEASE A-1 PULLEY OF RIGHT THUMB;  Surgeon: Wynonia Sours, MD;  Location: [place];  Service: Orthopedics;  Laterality: Right;  . TUBAL LIGATION  1993   Past Medical History:  Diagnosis  Date  . Anemia    PT HAS HAD COLONOSCOPY AND ENDOSCOPY WORK UP - NO PROBLEMS FOUND AS SOURCE OF ANEMIA -- PT HAD IRON INFUSION AT [facility] 11/13/12-AFTER SEEING HEMATOLOGIST DR. Hardee AND HE GAVE HEMATOLOGIC CLEARANCE FOR GASTRIC BYPASS SURGERY.  Marland Kitchen Anxiety   . Asthma    daily and prn inhalers  . Degenerative joint disease    right knee, spine - STATES INTERMITTENT  NUMBNESS DOWN RT LEG WITH PROLONGED STANDING OR WALKING- THINKS RELATED TO HER SPINE PROBLEMS  . Dental crowns present   . Depression   . Family history of anesthesia complication    pt's mother and sister have hx. of post-op N/V  . Fibroids    UTERINE  . Gastroesophageal reflux disease    none for 2 years since Bariatric surgery.  . H/O hiatal hernia   . History of endometriosis   . History of kidney stones   . Hyperlipidemia   . Hypertension    under control with med., has  been on med. x 2 yr.  . Insulin dependent diabetes mellitus   . Lock jaw    jaw locks open if opens mouth wide  . Migraines   . Neuropathy    FEET  . Obesity   . Palpitations   . PONV (postoperative nausea and vomiting)   . Shortness of breath    with daily activities  . Sleep apnea    post-op didn't need CPAP but now states needs it again but not using   BP 126/84   Pulse 98   Temp 98.8 F (37.1 C)   Ht 5\' 3"  (1.6 m)   Wt 193 lb 12.8 oz (87.9 kg)   LMP 07/10/2013   SpO2 96%   BMI 34.33 kg/m   Opioid Risk Score:   Fall Risk Score:  `1  Depression screen PHQ 2/9  Depression screen [facility] 2/9 06/15/2020 04/12/2020 03/15/2020 05/15/2017 12/11/2013  Decreased Interest 0 1 1 3  0  Down, Depressed, Hopeless 0 1 1 3  0  PHQ - 2 Score 0 2 2 6  0  Altered sleeping - 1 0 3 -  Tired, decreased energy - 1 1 3  -  Change in appetite - 2 1 3  -  Feeling bad or failure about yourself  - 1 0 3 -  Trouble concentrating - 1 0 3 -  Moving slowly or fidgety/restless - 1 1 1  -  Suicidal thoughts - 0 1 2 -  PHQ-9 Score - 9 6 24  -  Difficult doing  work/chores - - Somewhat difficult Extremely dIfficult -  Some recent data might be hidden   Review of Systems  Constitutional: Positive for unexpected weight change.       Weight gain  Respiratory: Positive for apnea.   Gastrointestinal: Positive for diarrhea.  Endocrine:       Low & High blood glucose  Musculoskeletal: Positive for back pain and gait problem.       Pain in right lower back down to upper leg  Neurological: Positive for weakness and numbness.       Tingling  Psychiatric/Behavioral: Positive for confusion.       Depression, [place]  All other systems reviewed and are negative.     Objective:   Physical Exam  Constitutional: No distress . Vital signs reviewed. HENT: Normocephalic.  Atraumatic. Eyes: EOMI. No discharge. Cardiovascular: No JVD.   Respiratory: Normal effort.  No stridor.   GI: Non-distended.   Skin: Warm and dry.  Intact. Psych: Normal mood.  Normal behavior. Musc: No edema in extremities.  No tenderness in extremities. Neuro: Alert Motor: Right lower extremity: Hip flexion, knee extension 4+/5, ankle dorsiflexion 4+/5, improving Left lower extremity: Hip flexion, knee extension 4+/5, ankle dorsiflexion 4+/5  Sensation diminished in b/l LE >> feet, R> L .    Assessment & Plan:  Female with history of HTN, T2DM, gastric bypass, depression anxiety presents for follow up microdiscectomy and open decompression (L3-L5) secondary to Lumbar stenosis with myelopathy.   1.  B/L LE weakness, Left > Right with impaired function/ADLs/mobility s/p microdiscectomy and open decompression (L3-L5) secondary to Lumbar stenosis with myelopathy.   Completed therapies, cont HEP  Continue follow up with Neurosurg  2. Pain Management:              Weaned off Baclofen              Neurontin 400 mg qid             Continue Amitriptyline 10mg   HS, educated on signs/symptoms of serotonin syndrome   Follow up with Surgery  3. Gait abnormality  Cont cane for  safety  Continue HEP

## 2020-06-30 DIAGNOSIS — F339 Major depressive disorder, recurrent, unspecified: Secondary | ICD-10-CM | POA: Diagnosis not present

## 2020-07-23 DIAGNOSIS — Z6835 Body mass index (BMI) 35.0-35.9, adult: Secondary | ICD-10-CM | POA: Diagnosis not present

## 2020-07-23 DIAGNOSIS — M5416 Radiculopathy, lumbar region: Secondary | ICD-10-CM | POA: Diagnosis not present

## 2020-07-23 DIAGNOSIS — I10 Essential (primary) hypertension: Secondary | ICD-10-CM | POA: Diagnosis not present

## 2020-08-16 ENCOUNTER — Other Ambulatory Visit (HOSPITAL_COMMUNITY): Payer: Self-pay | Admitting: Surgery

## 2020-08-16 DIAGNOSIS — E559 Vitamin D deficiency, unspecified: Secondary | ICD-10-CM

## 2020-08-16 MED ORDER — ERGOCALCIFEROL 1.25 MG (50000 UT) PO CAPS: 50000.0000 [IU] | ORAL_CAPSULE | ORAL | 3 refills | Status: DC

## 2020-09-08 ENCOUNTER — Other Ambulatory Visit: Payer: Self-pay | Admitting: Family Medicine

## 2020-09-29 ENCOUNTER — Other Ambulatory Visit: Payer: Self-pay | Admitting: *Deleted

## 2020-09-29 MED ORDER — METFORMIN HCL 850 MG PO TABS: 850.0000 mg | ORAL_TABLET | Freq: Two times a day (BID) | ORAL | 0 refills | Status: DC

## 2020-10-01 ENCOUNTER — Telehealth: Payer: Self-pay | Admitting: Family Medicine

## 2020-10-01 DIAGNOSIS — E7849 Other hyperlipidemia: Secondary | ICD-10-CM

## 2020-10-01 DIAGNOSIS — D509 Iron deficiency anemia, unspecified: Secondary | ICD-10-CM

## 2020-10-01 DIAGNOSIS — Z79899 Other long term (current) drug therapy: Secondary | ICD-10-CM

## 2020-10-01 DIAGNOSIS — E119 Type 2 diabetes mellitus without complications: Secondary | ICD-10-CM

## 2020-10-01 DIAGNOSIS — M5116 Intervertebral disc disorders with radiculopathy, lumbar region: Secondary | ICD-10-CM

## 2020-10-01 DIAGNOSIS — I10 Essential (primary) hypertension: Secondary | ICD-10-CM

## 2020-10-01 NOTE — Telephone Encounter (Signed)
[facility] [address] requesting refill on Gabapentin 400 mg capsules. Take one capsule po four times daily. Pt last seen 04/26/20 for HTN. Please advise. Thank you

## 2020-10-03 NOTE — Telephone Encounter (Signed)
May have 4 months worth of refills on gabapentin Recommend CBC, lipid, liver, metabolic 7, [id number] in October with follow-up office visit in October

## 2020-10-04 MED ORDER — GABAPENTIN 400 MG PO CAPS: 400.0000 mg | ORAL_CAPSULE | Freq: Four times a day (QID) | ORAL | 3 refills | Status: DC

## 2020-10-04 NOTE — Telephone Encounter (Signed)
Pt contacted office and schedule appt. Pt states she will have labs done before visit also

## 2020-10-04 NOTE — Addendum Note (Signed)
Addended by: Vicente Males on: 10/04/2020 09:49 AM   Modules accepted: Orders

## 2020-10-04 NOTE — Telephone Encounter (Signed)
Lab orders placed; refills sent to pharmacy. Left message to return call and also sent my chart message

## 2020-10-15 ENCOUNTER — Telehealth: Payer: Self-pay | Admitting: Family Medicine

## 2020-10-15 NOTE — Telephone Encounter (Signed)
  Left message for patient to call back and schedule Medicare Annual Wellness Visit (AWV) in office.   If unable to come into the office for AWV,  please offer to do virtually or by telephone.  No hx of AWV eligible for AWVI as of 07/23/2012  Please schedule at anytime with [facility] Advisor.      40 Minutes appointment   Any questions, please call me at [phone]

## 2020-10-20 DIAGNOSIS — F339 Major depressive disorder, recurrent, unspecified: Secondary | ICD-10-CM | POA: Diagnosis not present

## 2020-10-31 ENCOUNTER — Other Ambulatory Visit: Payer: Self-pay | Admitting: Family Medicine

## 2020-11-01 ENCOUNTER — Ambulatory Visit: Payer: Medicare Other | Admitting: Family Medicine

## 2020-11-01 ENCOUNTER — Other Ambulatory Visit: Payer: Self-pay | Admitting: Family Medicine

## 2020-11-01 DIAGNOSIS — M5116 Intervertebral disc disorders with radiculopathy, lumbar region: Secondary | ICD-10-CM

## 2020-11-01 MED ORDER — PEN NEEDLES 32G X 4 MM MISC: 32.0000 g | Freq: Every day | 0 refills | Status: AC

## 2020-11-04 ENCOUNTER — Other Ambulatory Visit: Payer: Self-pay | Admitting: Family Medicine

## 2020-11-04 DIAGNOSIS — M5116 Intervertebral disc disorders with radiculopathy, lumbar region: Secondary | ICD-10-CM

## 2020-11-17 DIAGNOSIS — I10 Essential (primary) hypertension: Secondary | ICD-10-CM | POA: Diagnosis not present

## 2020-11-17 DIAGNOSIS — Z6834 Body mass index (BMI) 34.0-34.9, adult: Secondary | ICD-10-CM | POA: Diagnosis not present

## 2020-11-17 DIAGNOSIS — M5416 Radiculopathy, lumbar region: Secondary | ICD-10-CM | POA: Diagnosis not present

## 2020-11-18 DIAGNOSIS — E7849 Other hyperlipidemia: Secondary | ICD-10-CM | POA: Diagnosis not present

## 2020-11-18 DIAGNOSIS — I10 Essential (primary) hypertension: Secondary | ICD-10-CM | POA: Diagnosis not present

## 2020-11-18 DIAGNOSIS — Z794 Long term (current) use of insulin: Secondary | ICD-10-CM | POA: Diagnosis not present

## 2020-11-18 DIAGNOSIS — Z79899 Other long term (current) drug therapy: Secondary | ICD-10-CM | POA: Diagnosis not present

## 2020-11-18 DIAGNOSIS — D509 Iron deficiency anemia, unspecified: Secondary | ICD-10-CM | POA: Diagnosis not present

## 2020-11-18 DIAGNOSIS — E119 Type 2 diabetes mellitus without complications: Secondary | ICD-10-CM | POA: Diagnosis not present

## 2020-11-19 LAB — CBC WITH DIFFERENTIAL/PLATELET
Basophils Absolute: 0.1 10*3/uL (ref 0.0–0.2)
Basos: 1 %
EOS (ABSOLUTE): 0.2 10*3/uL (ref 0.0–0.4)
Eos: 2 %
Hematocrit: 42.2 % (ref 34.0–46.6)
Hemoglobin: 14.6 g/dL (ref 11.1–15.9)
Immature Grans (Abs): 0 10*3/uL (ref 0.0–0.1)
Immature Granulocytes: 0 %
Lymphocytes Absolute: 1.9 10*3/uL (ref 0.7–3.1)
Lymphs: 24 %
MCH: 30.2 pg (ref 26.6–33.0)
MCHC: 34.6 g/dL (ref 31.5–35.7)
MCV: 87 fL (ref 79–97)
Monocytes Absolute: 0.6 10*3/uL (ref 0.1–0.9)
Monocytes: 8 %
Neutrophils Absolute: 5.3 10*3/uL (ref 1.4–7.0)
Neutrophils: 65 %
Platelets: 246 10*3/uL (ref 150–450)
RBC: 4.83 x10E6/uL (ref 3.77–5.28)
RDW: 12.4 % (ref 11.7–15.4)
WBC: 8 10*3/uL (ref 3.4–10.8)

## 2020-11-19 LAB — LIPID PANEL
Chol/HDL Ratio: 3.2 ratio (ref 0.0–4.4)
Cholesterol, Total: 145 mg/dL (ref 100–199)
HDL: 45 mg/dL (ref >39)
LDL Chol Calc (NIH): 68 mg/dL (ref 0–99)
Triglycerides: 192 mg/dL — ABNORMAL HIGH (ref 0–149)
VLDL Cholesterol Cal: 32 mg/dL (ref 5–40)

## 2020-11-19 LAB — BASIC METABOLIC PANEL
BUN/Creatinine Ratio: 8 — ABNORMAL LOW (ref 9–23)
BUN: 6 mg/dL (ref 6–24)
CO2: 24 mmol/L (ref 20–29)
Calcium: 9 mg/dL (ref 8.7–10.2)
Chloride: 104 mmol/L (ref 96–106)
Creatinine, Ser: 0.71 mg/dL (ref 0.57–1.00)
Glucose: 134 mg/dL — ABNORMAL HIGH (ref 70–99)
Potassium: 4.2 mmol/L (ref 3.5–5.2)
Sodium: 140 mmol/L (ref 134–144)
eGFR: 98 mL/min/{1.73_m2} (ref >59)

## 2020-11-19 LAB — HEPATIC FUNCTION PANEL
ALT: 25 IU/L (ref 0–32)
AST: 19 IU/L (ref 0–40)
Albumin: 4.2 g/dL (ref 3.8–4.9)
Alkaline Phosphatase: 133 IU/L — ABNORMAL HIGH (ref 44–121)
Bilirubin Total: 0.3 mg/dL (ref 0.0–1.2)
Bilirubin, Direct: <0.1 mg/dL (ref 0.00–0.40)
Total Protein: 6.2 g/dL (ref 6.0–8.5)

## 2020-11-19 LAB — HEMOGLOBIN A1C
Est. average glucose Bld gHb Est-mCnc: 148 mg/dL
Hgb A1c MFr Bld: 6.8 % — ABNORMAL HIGH (ref 4.8–5.6)

## 2020-11-22 ENCOUNTER — Encounter: Payer: Self-pay | Admitting: Family Medicine

## 2020-11-22 ENCOUNTER — Ambulatory Visit (INDEPENDENT_AMBULATORY_CARE_PROVIDER_SITE_OTHER): Payer: Medicare Other | Admitting: Family Medicine

## 2020-11-22 ENCOUNTER — Other Ambulatory Visit: Payer: Self-pay

## 2020-11-22 VITALS — BP 123/70 | Temp 96.8°F | Wt 194.8 lb

## 2020-11-22 DIAGNOSIS — E785 Hyperlipidemia, unspecified: Secondary | ICD-10-CM | POA: Diagnosis not present

## 2020-11-22 DIAGNOSIS — Z1231 Encounter for screening mammogram for malignant neoplasm of breast: Secondary | ICD-10-CM

## 2020-11-22 DIAGNOSIS — I10 Essential (primary) hypertension: Secondary | ICD-10-CM

## 2020-11-22 DIAGNOSIS — G4733 Obstructive sleep apnea (adult) (pediatric): Secondary | ICD-10-CM

## 2020-11-22 DIAGNOSIS — M5116 Intervertebral disc disorders with radiculopathy, lumbar region: Secondary | ICD-10-CM | POA: Diagnosis not present

## 2020-11-22 DIAGNOSIS — E1169 Type 2 diabetes mellitus with other specified complication: Secondary | ICD-10-CM

## 2020-11-22 DIAGNOSIS — Z23 Encounter for immunization: Secondary | ICD-10-CM | POA: Diagnosis not present

## 2020-11-22 MED ORDER — OZEMPIC (0.25 OR 0.5 MG/DOSE) 2 MG/1.5ML ~~LOC~~ SOPN: PEN_INJECTOR | SUBCUTANEOUS | 3 refills | Status: DC

## 2020-11-22 MED ORDER — GABAPENTIN 400 MG PO CAPS: ORAL_CAPSULE | ORAL | 5 refills | Status: DC

## 2020-11-22 MED ORDER — AMLODIPINE BESYLATE 2.5 MG PO TABS: 2.5000 mg | ORAL_TABLET | Freq: Every day | ORAL | 1 refills | Status: DC

## 2020-11-22 MED ORDER — ALBUTEROL SULFATE HFA 108 (90 BASE) MCG/ACT IN AERS
2.0000 | INHALATION_SPRAY | Freq: Four times a day (QID) | RESPIRATORY_TRACT | 6 refills | Status: DC | PRN
Start: 2020-11-22 — End: 2022-02-08

## 2020-11-22 MED ORDER — LOSARTAN POTASSIUM 100 MG PO TABS: 100.0000 mg | ORAL_TABLET | Freq: Every day | ORAL | 1 refills | Status: DC

## 2020-11-22 MED ORDER — ROSUVASTATIN CALCIUM 10 MG PO TABS: 10.0000 mg | ORAL_TABLET | Freq: Every day | ORAL | 1 refills | Status: DC

## 2020-11-22 MED ORDER — METFORMIN HCL 850 MG PO TABS: 850.0000 mg | ORAL_TABLET | Freq: Two times a day (BID) | ORAL | 1 refills | Status: DC

## 2020-11-22 NOTE — Patient Instructions (Signed)

## 2020-11-22 NOTE — Progress Notes (Signed)
   Subjective:    Patient ID: Carrie Cook, female    DOB: Sep 09, 1960, 60 y.o.   MRN: [id number]  HPI Pt here for 6 month follow up on HTN and DM. Sugars usually less than 150 in the mornings. Checking sugars not very often. Only checks bp at dr appt. Taking all meds as directed. No issues. [facility].   The patient was seen today as part of a comprehensive diabetic check up. Patient has diabetes  Compliance-overall good compliance Low sugars-occasional low sugar Dietary effort-states she feels hungry frequently tends to eat a little more than what she should Foot exam and ophthalmology exam requirements were reviewed   Patient here for follow-up regarding cholesterol.    Diet-healthy eating  Compliance with medicine-good  Side effects-denies side effects  Activity-overall good activity  Regular lab work regarding lipid and liver was checked and if needing additional labs was appropriately ordered   Review of Systems     Objective:   Physical Exam  General-in no acute distress Eyes-no discharge Lungs-respiratory rate normal, CTA CV-no murmurs,RRR Extremities skin warm dry no edema Neuro grossly normal Behavior normal, alert       Assessment & Plan:  1. Primary hypertension Blood pressure decent control continue current measures - Hemoglobin D2K - Basic metabolic panel  2. Obstructive sleep apnea Subpar usage of CPAP patient will do a better job of utilizing this on a regular basis  3. Hyperlipidemia associated with type 2 diabetes mellitus ([place]) Continue current medication.  Lab work looks good.  Healthy diet. - Hemoglobin G2R - Basic metabolic panel  4. Intervertebral disc disorders with radiculopathy, lumbar region Continue gabapentin she feels it does help to some degree - gabapentin (NEURONTIN) 400 MG capsule; TAKE 1 CAPSULE(400 MG) BY MOUTH FOUR TIMES DAILY  Dispense: 120 capsule; Refill: 5  5. Screening mammogram for  breast cancer Mammogram recommended - MM DIGITAL SCREENING BILATERAL  6. Immunization due Pneumonia and flu shot recommended - Pneumococcal conjugate vaccine 20-valent (Prevnar 20) - Flu Vaccine QUAD 53mo+IM (Fluarix, Fluzone & Alfiuria Quad PF)  Ozempic recommended.  Side effects discussed.  Rationale discussed.  Continue current medication.  Watch diet.  Follow-up again in 3 months time check lab work before that visit If any side effect problems or issues let me know Follow-up sooner problems  Significant obesity we did discuss how Ozempic can help bring her weight down.  Also she is to stop taking her insulin.

## 2020-12-10 ENCOUNTER — Emergency Department (HOSPITAL_COMMUNITY): Payer: Medicare Other

## 2020-12-10 ENCOUNTER — Other Ambulatory Visit: Payer: Self-pay

## 2020-12-10 ENCOUNTER — Encounter (HOSPITAL_COMMUNITY): Payer: Self-pay

## 2020-12-10 ENCOUNTER — Emergency Department (HOSPITAL_COMMUNITY)
Admission: EM | Admit: 2020-12-10 | Discharge: 2020-12-10 | Disposition: A | Discharge status: Home / Self Care | Payer: Medicare Other | Attending: Emergency Medicine | Admitting: Emergency Medicine

## 2020-12-10 DIAGNOSIS — M549 Dorsalgia, unspecified: Secondary | ICD-10-CM | POA: Diagnosis present

## 2020-12-10 DIAGNOSIS — Z96651 Presence of right artificial knee joint: Secondary | ICD-10-CM | POA: Insufficient documentation

## 2020-12-10 DIAGNOSIS — J45909 Unspecified asthma, uncomplicated: Secondary | ICD-10-CM | POA: Diagnosis not present

## 2020-12-10 DIAGNOSIS — Z7984 Long term (current) use of oral hypoglycemic drugs: Secondary | ICD-10-CM | POA: Insufficient documentation

## 2020-12-10 DIAGNOSIS — E785 Hyperlipidemia, unspecified: Secondary | ICD-10-CM | POA: Insufficient documentation

## 2020-12-10 DIAGNOSIS — I10 Essential (primary) hypertension: Secondary | ICD-10-CM | POA: Insufficient documentation

## 2020-12-10 DIAGNOSIS — Z79899 Other long term (current) drug therapy: Secondary | ICD-10-CM | POA: Insufficient documentation

## 2020-12-10 DIAGNOSIS — M79604 Pain in right leg: Secondary | ICD-10-CM | POA: Diagnosis not present

## 2020-12-10 DIAGNOSIS — M4726 Other spondylosis with radiculopathy, lumbar region: Secondary | ICD-10-CM | POA: Insufficient documentation

## 2020-12-10 DIAGNOSIS — Z794 Long term (current) use of insulin: Secondary | ICD-10-CM | POA: Insufficient documentation

## 2020-12-10 DIAGNOSIS — M5416 Radiculopathy, lumbar region: Secondary | ICD-10-CM

## 2020-12-10 DIAGNOSIS — E1169 Type 2 diabetes mellitus with other specified complication: Secondary | ICD-10-CM | POA: Insufficient documentation

## 2020-12-10 DIAGNOSIS — M545 Low back pain, unspecified: Secondary | ICD-10-CM | POA: Diagnosis not present

## 2020-12-10 LAB — URINALYSIS, ROUTINE W REFLEX MICROSCOPIC
Bilirubin Urine: NEGATIVE
Glucose, UA: 50 mg/dL — AB
Ketones, ur: 5 mg/dL — AB
Nitrite: NEGATIVE
Protein, ur: 30 mg/dL — AB
Specific Gravity, Urine: 1.016 (ref 1.005–1.030)
pH: 5 (ref 5.0–8.0)

## 2020-12-10 LAB — CBC WITH DIFFERENTIAL/PLATELET
Abs Immature Granulocytes: 0.03 10*3/uL (ref 0.00–0.07)
Basophils Absolute: 0 10*3/uL (ref 0.0–0.1)
Basophils Relative: 1 %
Eosinophils Absolute: 0 10*3/uL (ref 0.0–0.5)
Eosinophils Relative: 0 %
HCT: 48.1 % — ABNORMAL HIGH (ref 36.0–46.0)
Hemoglobin: 16.6 g/dL — ABNORMAL HIGH (ref 12.0–15.0)
Immature Granulocytes: 0 %
Lymphocytes Relative: 20 %
Lymphs Abs: 1.4 10*3/uL (ref 0.7–4.0)
MCH: 30.2 pg (ref 26.0–34.0)
MCHC: 34.5 g/dL (ref 30.0–36.0)
MCV: 87.5 fL (ref 80.0–100.0)
Monocytes Absolute: 1 10*3/uL (ref 0.1–1.0)
Monocytes Relative: 14 %
Neutro Abs: 4.8 10*3/uL (ref 1.7–7.7)
Neutrophils Relative %: 65 %
Platelets: 271 10*3/uL (ref 150–400)
RBC: 5.5 MIL/uL — ABNORMAL HIGH (ref 3.87–5.11)
RDW: 12.2 % (ref 11.5–15.5)
WBC: 7.3 10*3/uL (ref 4.0–10.5)
nRBC: 0 % (ref 0.0–0.2)

## 2020-12-10 LAB — BASIC METABOLIC PANEL
Anion gap: 14 (ref 5–15)
BUN: 15 mg/dL (ref 6–20)
CO2: 18 mmol/L — ABNORMAL LOW (ref 22–32)
Calcium: 9.7 mg/dL (ref 8.9–10.3)
Chloride: 106 mmol/L (ref 98–111)
Creatinine, Ser: 1.1 mg/dL — ABNORMAL HIGH (ref 0.44–1.00)
GFR, Estimated: 58 mL/min — ABNORMAL LOW (ref >60)
Glucose, Bld: 146 mg/dL — ABNORMAL HIGH (ref 70–99)
Potassium: 4.2 mmol/L (ref 3.5–5.1)
Sodium: 138 mmol/L (ref 135–145)

## 2020-12-10 LAB — CBG MONITORING, ED: Glucose-Capillary: 202 mg/dL — ABNORMAL HIGH (ref 70–99)

## 2020-12-10 MED ORDER — OXYCODONE-ACETAMINOPHEN 5-325 MG PO TABS
1.0000 | ORAL_TABLET | Freq: Once | ORAL | Status: AC
  Administered 2020-12-10: 1 via ORAL
  Filled 2020-12-10: qty 1

## 2020-12-10 MED ORDER — DEXAMETHASONE SODIUM PHOSPHATE 10 MG/ML IJ SOLN
10.0000 mg | Freq: Once | INTRAMUSCULAR | Status: AC
  Administered 2020-12-10: 10 mg via INTRAVENOUS
  Filled 2020-12-10: qty 1

## 2020-12-10 MED ORDER — HYDROMORPHONE HCL 1 MG/ML IJ SOLN: 1.0000 mg | INTRAMUSCULAR | Status: DC | PRN

## 2020-12-10 MED ORDER — METHYLPREDNISOLONE SODIUM SUCC 125 MG IJ SOLR
125.0000 mg | Freq: Once | INTRAMUSCULAR | Status: AC
  Administered 2020-12-10: 125 mg via INTRAVENOUS
  Filled 2020-12-10: qty 2

## 2020-12-10 MED ORDER — METHYLPREDNISOLONE 4 MG PO TBPK: ORAL_TABLET | ORAL | 0 refills | Status: DC

## 2020-12-10 MED ORDER — GADOBUTROL 1 MMOL/ML IV SOLN
9.0000 mL | Freq: Once | INTRAVENOUS | Status: AC | PRN
  Administered 2020-12-10: 9 mL via INTRAVENOUS

## 2020-12-10 MED ORDER — METHYL SALICYLATE-LIDO-MENTHOL 4-4-5 % EX PTCH: 1.0000 | MEDICATED_PATCH | Freq: Two times a day (BID) | CUTANEOUS | 0 refills | Status: DC

## 2020-12-10 NOTE — ED Notes (Signed)
Neurosurgery provider at bedside.

## 2020-12-10 NOTE — ED Triage Notes (Signed)
Pt. Stated, Carrie Cook been having back, leg, and hip pain since Nov. 1 and have been trying to see the Dr. And have an appt Dec. 7, but I need so ething for the pain

## 2020-12-10 NOTE — ED Provider Notes (Signed)
[facility] EMERGENCY DEPARTMENT Provider Note   CSN: [id number] Arrival date & time: 12/10/20  8280     History Chief Complaint  Patient presents with   Back Pain   Leg Pain   Hip Pain    Carrie Cook is a 60 y.o. female.  HPI Patient reports he has significant pain for the past 1-2 weeks.  He reports prior problem with radiculopathy.  Patient had laminectomy decompression in the lumbar area.  She reports she had some residual numbness of the right leg after this.  This week however pain has gotten much worse and now the leg will start going into spasms and is difficult to walk on.  She denies it feels specifically weak but pain is significantly limiting her gait.  Patient has not recently been on steroids.  She is trying some over-the-counter medications with no relief.  He denies abdominal pain.  She denies bowel or bladder dysfunction.  No incontinence.  No Chest pain or shortness of breath    Past Medical History:  Diagnosis Date   Anemia    PT HAS HAD COLONOSCOPY AND ENDOSCOPY WORK UP - NO PROBLEMS FOUND AS SOURCE OF ANEMIA -- PT HAD IRON INFUSION AT [facility] 11/13/12-AFTER SEEING HEMATOLOGIST DR. Sunray AND HE GAVE HEMATOLOGIC CLEARANCE FOR GASTRIC BYPASS SURGERY.   Anxiety    Asthma    daily and prn inhalers   Degenerative joint disease    right knee, spine - STATES INTERMITTENT  NUMBNESS DOWN RT LEG WITH PROLONGED STANDING OR WALKING- THINKS RELATED TO HER SPINE PROBLEMS   Dental crowns present    Depression    Family history of anesthesia complication    pt's mother and sister have hx. of post-op N/V   Fibroids    UTERINE   Gastroesophageal reflux disease    none for 2 years since Bariatric surgery.   H/O hiatal hernia    History of endometriosis    History of kidney stones    Hyperlipidemia    Hypertension    under control with med., has been on med. x 2 yr.   Insulin dependent diabetes mellitus    Lock jaw    jaw locks open if opens  mouth wide   Migraines    Neuropathy    FEET   Obesity    Palpitations    PONV (postoperative nausea and vomiting)    Shortness of breath    with daily activities   Sleep apnea    post-op didn't need CPAP but now states needs it again but not using    Patient Active Problem List   Diagnosis Date Noted   Status post lumbar spine surgery for decompression of spinal cord 04/12/2020   Abnormality of gait 04/12/2020   Neuropathic pain 04/12/2020   Myelopathy concurrent with and due to stenosis of lumbar spine ([place]) 02/25/2020   Hypoalbuminemia due to protein-calorie malnutrition (HCC)    Muscle spasm    Essential hypertension    Drug induced constipation    Postoperative pain    Lumbar disc herniation with myelopathy 02/24/2020   Left foot drop 02/14/2020   Lumbar spinal stenosis 02/14/2020   Lumbar radiculopathy 02/13/2020   Acute right-sided back pain with sciatica 10/02/2019   S/P total knee replacement, right 07/22/19  09/01/2019   Primary localized osteoarthritis of left knee 07/22/2019   Primary osteoarthritis of right knee    Constipation 09/05/2018   Abdominal pain, chronic, epigastric 05/27/2018   Dysphagia 05/27/2018  Depression, major, single episode, moderate ([place]) 03/22/2018   S/P complete hysterectomy 06/06/2017   Derangement of posterior horn of medial meniscus of right knee    Right knee pain 09/10/2015   Iron deficiency anemia 10/16/2013   Fibroids 08/21/2013   Excessive or frequent menstruation 06/19/2013   Morbid obesity ([place]) 07/12/2012   Obstructive sleep apnea 06/21/2012   Chest pain    Hyperlipidemia associated with type 2 diabetes mellitus ([place])    Type 2 diabetes mellitus ([place])    Obesity    Laboratory test    Nephrolithiasis    Asthma    Endometriosis    Hypertension    Iron deficiency anemia 05/11/2009   GERD 05/11/2009    Past Surgical History:  Procedure Laterality Date   ABDOMINAL HYSTERECTOMY N/A 06/06/2017   Procedure: HYSTERECTOMY  ABDOMINAL;  Surgeon: Florian Buff, MD;  Location: [facility];  Service: Gynecology;  Laterality: N/A;   BACK SURGERY     BREATH TEK H PYLORI N/A 08/13/2012   Procedure: BREATH TEK H PYLORI;  Surgeon: Edward Jolly, MD;  Location: [facility];  Service: General;  Laterality: N/A;   CARPAL TUNNEL RELEASE  01/03/2012   Procedure: CARPAL TUNNEL RELEASE;  Surgeon: Wynonia Sours, MD;  Location: [place];  Service: Orthopedics;  Laterality: Right;   carpal tunnel release right hand Right 12/13   COLONOSCOPY  05/2009   AOZ:HYQMVHQI hemorrhoids otherwise normal colon, rectum and terminal ileum   COLONOSCOPY WITH ESOPHAGOGASTRODUODENOSCOPY (EGD) N/A 10/28/2012   Procedure: COLONOSCOPY WITH ESOPHAGOGASTRODUODENOSCOPY (EGD);  Surgeon: Daneil Dolin, MD;  [facility];  Service: Endoscopy;  Laterality: N/A;  7:30   DILITATION & CURRETTAGE/HYSTROSCOPY WITH NOVASURE ABLATION N/A 07/22/2014   Procedure: DILATATION & CURETTAGE/HYSTEROSCOPY WITH NOVASURE ABLATION; uterine length 6.0 cm; uterine width 3.8 cm; total ablation time 1 minute 15 seconds;  Surgeon: Florian Buff, MD;  Location: [facility];  Service: Gynecology;  Laterality: N/A;   ESOPHAGOGASTRODUODENOSCOPY  5/013/2011   ONG:EXBMWU esophagus/multiple polyps removed s/p (hyperplastic). Gastritis without H.pylori.   ESOPHAGOGASTRODUODENOSCOPY (EGD) WITH PROPOFOL N/A 06/06/2018   with LA Grade A esophagitis s/p dilation.    GASTRIC ROUX-EN-Y N/A 11/19/2012   Procedure: LAPAROSCOPIC ROUX-EN-Y GASTRIC;  Surgeon: Edward Jolly, MD;  Location: [facility];  Service: General;  Laterality: N/A;   GIVENS CAPSULE STUDY N/A 10/28/2012   Procedure: GIVENS CAPSULE STUDY;  Surgeon: Daneil Dolin, MD;  [facility];  Service: Endoscopy;  Laterality: N/A;   KNEE ARTHROSCOPY WITH MEDIAL MENISECTOMY Right 10/28/2015   Procedure: RIGHT KNEE ARTHROSCOPY WITH MEDIAL MENISECTOMY;  Surgeon: Carole Civil, MD;  Location: [facility];   Service: Orthopedics;  Laterality: Right;   LAPAROSCOPY     due to endometriosis   LUMBAR LAMINECTOMY/DECOMPRESSION MICRODISCECTOMY N/A 02/14/2020   Procedure: LUMBAR LAMINECTOMY  Lumbar three-four, Lumbar four-five;  Surgeon: Consuella Lose, MD;  Location: [place];  Service: Neurosurgery;  Laterality: N/A;   MALONEY DILATION N/A 06/06/2018   Procedure: Venia Minks DILATION;  Surgeon: Daneil Dolin, MD;  [facility];  Service: Endoscopy;  Laterality: N/A;   PELVIC LAPAROSCOPY  11/04/1999   with fulguration of endometriosis   SALPINGOOPHORECTOMY Bilateral 06/06/2017   Procedure: SALPINGO OOPHORECTOMY;  Surgeon: Florian Buff, MD;  Location: [facility];  Service: Gynecology;  Laterality: Bilateral;   TOTAL KNEE ARTHROPLASTY Right 07/22/2019   Procedure: TOTAL KNEE ARTHROPLASTY;  Surgeon: Carole Civil, MD;  Location: [facility];  Service: Orthopedics;  Laterality: Right;  TRIGGER FINGER RELEASE Right 09/24/2012   Procedure: RELEASE A-1 PULLEY OF RIGHT THUMB;  Surgeon: Wynonia Sours, MD;  Location: [place];  Service: Orthopedics;  Laterality: Right;   TUBAL LIGATION  1993     OB History     Gravida  1   Para  1   Term      Preterm      AB      Living         SAB      IAB      Ectopic      Multiple      Live Births  1           Family History  Problem Relation Age of Onset   Hypertension Father    Hyperlipidemia Father    COPD Father    COPD Mother    Heart disease Mother    Cancer Mother        Cervix   Anesthesia problems Mother        post-op N/V   Thyroid disease Mother    Diabetes Maternal Grandfather    Thyroid disease Sister    Anesthesia problems Sister        post-op N/V   Thyroid disease Paternal Uncle    Diabetes Other    Colon cancer Neg Hx    Colon polyps Neg Hx     Social History   Tobacco Use   Smoking status: Never   Smokeless tobacco: Never  Vaping Use   Vaping Use: Never used  Substance Use Topics    Alcohol use: No   Drug use: No    Home Medications Prior to Admission medications   Medication Sig Start Date End Date Taking? Authorizing Provider  albuterol (VENTOLIN HFA) 108 (90 Base) MCG/ACT inhaler Inhale 2 puffs into the lungs every 6 (six) hours as needed for wheezing. 11/22/20   Kathyrn Drown, MD  amLODipine (NORVASC) 2.5 MG tablet Take 1 tablet (2.5 mg total) by mouth daily. 11/22/20   Kathyrn Drown, MD  cetirizine (ZYRTEC) 10 MG tablet Take 10 mg by mouth daily.    [provider]  ergocalciferol (VITAMIN D2) 1.25 MG (50000 UT) capsule Take 1 capsule (50,000 Units total) by mouth once a week. 08/16/20   Derek Jack, MD  estradiol (ESTRACE) 2 MG tablet TAKE 1 TABLET(2 MG) BY MOUTH AT BEDTIME 04/04/20   Florian Buff, MD  fluticasone (FLOVENT HFA) 220 MCG/ACT inhaler Inhale 2 puffs into the lungs 2 (two) times daily. Patient taking differently: Inhale 2 puffs into the lungs daily as needed (Shortness of breath). 03/20/17   Kathyrn Drown, MD  gabapentin (NEURONTIN) 400 MG capsule TAKE 1 CAPSULE(400 MG) BY MOUTH FOUR TIMES DAILY 11/22/20   Kathyrn Drown, MD  Insulin Pen Needle (PEN NEEDLES) 32G X 4 MM MISC 32 g by Does not apply route daily. 11/01/20   Kathyrn Drown, MD  lamoTRIgine (LAMICTAL) 100 MG tablet Take 100 mg by mouth 2 (two) times daily.  04/30/19   [provider]  losartan (COZAAR) 100 MG tablet Take 1 tablet (100 mg total) by mouth daily. 11/22/20   Kathyrn Drown, MD  Menthol, Topical Analgesic, (BIOFREEZE COLORLESS) 4 % GEL Apply 1 application topically daily as needed (mucle pain).    [provider]  metFORMIN (GLUCOPHAGE) 850 MG tablet Take 1 tablet (850 mg total) by mouth 2 (two) times daily with a meal. 11/22/20  Kathyrn Drown, MD  Multiple Vitamin (MULTIVITAMIN WITH MINERALS) TABS tablet Take 1 tablet by mouth daily. 03/05/20   Love, Ivan Anchors, PA-C  Multiple Vitamins-Iron (MULTIVITAMINS WITH IRON) TABS tablet Take 1 tablet  by mouth daily.    [provider]  rosuvastatin (CRESTOR) 10 MG tablet Take 1 tablet (10 mg total) by mouth daily. DUE FOR AN APPT IN AUG OR SEPT 11/22/20   Kathyrn Drown, MD  Semaglutide,0.25 or 0.5MG /DOS, (OZEMPIC, 0.25 OR 0.5 MG/DOSE,) 2 MG/1.5ML SOPN Inject 0.25 mg into the skin once weekly for 4 weeks, Then Inject 0.5 mg weekly 11/22/20   Kathyrn Drown, MD  venlafaxine (EFFEXOR) 75 MG tablet Take 75 mg by mouth 2 (two) times daily with a meal.  12/07/13   [provider]    Allergies    Qvar [beclomethasone], Sulfa antibiotics, Tizanidine, Ace inhibitors, Erythromycin ethylsuccinate, Hydrochlorothiazide, Prozac [fluoxetine hcl], Sulfonamide derivatives, Atorvastatin, Dyazide [hydrochlorothiazide w-triamterene], Erythromycin, Lipitor [atorvastatin calcium], Lisinopril, and Pravastatin  Review of Systems   Review of Systems 10 systems reviewed and negative except as per HPI Physical Exam Updated Vital Signs BP 134/86 (BP Location: Left Arm)   Pulse 96   Temp 99 F (37.2 C) (Oral)   Resp 15   LMP 07/10/2013   SpO2 95%   Physical Exam Constitutional:      Comments: Alert nontoxic mildly uncomfortable in appearance.  HENT:     Head: Normocephalic and atraumatic.     Mouth/Throat:     Pharynx: Oropharynx is clear.  Cardiovascular:     Rate and Rhythm: Normal rate and regular rhythm.  Pulmonary:     Effort: Pulmonary effort is normal.     Breath sounds: Normal breath sounds.  Abdominal:     General: There is no distension.     Palpations: Abdomen is soft.     Tenderness: There is no abdominal tenderness. There is no guarding.  Musculoskeletal:     Comments: Pain with full extension of the right leg and straight leg raise.  Lower extremity symmetric without peripheral edema or calf tenderness  Skin:    General: Skin is warm and dry.  Neurological:     Comments: Lower extremity strength intact 5\5 but pain limited on the right for maintaining elevation  and resisting extension.  Psychiatric:        Mood and Affect: Mood normal.    ED Results / Procedures / Treatments   Labs (all labs ordered are listed, but only abnormal results are displayed) Labs Reviewed  URINALYSIS, ROUTINE W REFLEX MICROSCOPIC  CBC WITH DIFFERENTIAL/PLATELET  BASIC METABOLIC PANEL  POC URINE PREG, ED    EKG None  Radiology DG Lumbar Spine Complete  Result Date: 12/10/2020 CLINICAL DATA:  Low back pain, right leg pain EXAM: LUMBAR SPINE - COMPLETE 4+ VIEW COMPARISON:  Lumbar spine radiographs and MRI 02/14/2020, lumbar spine radiographs 10/09/2019 FINDINGS: There are 5 non-rib-bearing lumbar vertebral bodies with rudimentary ribs arising from the T12 vertebral body. Vertebral body heights are preserved. Alignment is normal. There is mild multilevel disc space narrowing with associated degenerative endplate change and facet arthropathy throughout the lumbar spine. There is no spondylolysis. The SI joints are intact. IMPRESSION: Multilevel degenerative changes.  No acute findings. Electronically Signed   By: Valetta Mole M.D.   On: 12/10/2020 10:00    Procedures Procedures   Medications Ordered in ED Medications  HYDROmorphone (DILAUDID) injection 1 mg (has no administration in time range)  oxyCODONE-acetaminophen (PERCOCET/ROXICET) 5-325 MG per  tablet 1 tablet (1 tablet Oral Given 12/10/20 0944)  dexamethasone (DECADRON) injection 10 mg (10 mg Intravenous Given 12/10/20 1343)    ED Course  I have reviewed the triage vital signs and the nursing notes.  Pertinent labs & imaging results that were available during my care of the patient were reviewed by me and considered in my medical decision making (see chart for details).  Clinical Course as of 12/10/20 1348  Fri Dec 10, 2020  1003 Dr. Glenford Peers [BM]    Clinical Course User Index [BM] Gari Crown   MDM Rules/Calculators/A&P                           Patient presents with significant  pain evolution.  Consultation has already been placed by Durward Parcel to Dr. Glenford Peers with recommendation for MRI.  At this time we will provide pain control with Dilaudid and administer Decadron IV.  Findings are consistent with radiculopathy. Final disposition will be put pending MRI results and pain control.  Final Clinical Impression(s) / ED Diagnoses Final diagnoses:  Lumbar radiculopathy    Rx / DC Orders ED Discharge Orders     None        Charlesetta Shanks, MD 12/10/20 1351

## 2020-12-10 NOTE — ED Provider Notes (Signed)
Emergency Medicine Provider Triage Evaluation Note  Carrie Cook , a 60 y.o. female  was evaluated in triage.  Pt complains of low back pain rating down the right leg.  Patient reports a history of sciatica, she reports in January of this year she had to go to the operating room with Dr. Venetia Constable for a decompression of her lumbar, this was followed by a 1 month stay for rehab.  Patient reports her current flareup began around 2 weeks ago she describes severe right low back pain rating down to the right leg similar to prior.  Patient denies fall, injury, fever, chills, abdominal pain, vomiting, dysuria/hematuria, saddle paresthesias, bowel/bladder incontinence, urinary retention, extremity weakness or any additional concerns.  Review of Systems  Positive: Low back pain, right leg pain Negative: Patient denies fall, injury, fever, chills, abdominal pain, vomiting, dysuria/hematuria, saddle paresthesias, bowel/bladder incontinence, urinary retention, extremity weakness or any additional concerns.  Physical Exam  BP (!) 164/100   Pulse (!) 115   Temp 98.3 F (36.8 C) (Oral)   Resp 16   LMP 07/10/2013   SpO2 98%  Gen:   Awake, no distress   Resp:  Normal effort  MSK:   Moves extremities without difficulty  Other:  Right lumbar paraspinal muscular tenderness and right gluteal muscular tenderness without overlying skin change.  Pain increases with flexion of the lumbar spine.  Positive right leg raise.  Strong equal pedal pulses.  Compartments soft.  No clonus of the feet.  Sensation intact to all toes.  Abdomen soft nontender.  Medical Decision Making  Medically screening exam initiated at 10:20 AM.  Appropriate orders placed.  Carrie Cook was informed that the remainder of the evaluation will be completed by another provider, this initial triage assessment does not replace that evaluation, and the importance of remaining in the ED until their evaluation is complete.  60 year old  female arrived with right low back pain with what she describes as right-sided sciatica.  She denies any red flags at this time for cauda equina, neurologic examination is reassuring.  Additionally she has no abdominal pain or palpable or pulsatile masses.  She is neurovascularly intact to the bilateral lower extremities with strong equal pedal pulses.  Patient is notably hyper tensive in triage as well as tachycardic however suspect this is due to patient's pain.  We will give a dose of pain medication and recheck.  Patient took her blood pressure medications this morning.  She has no associated chest pain or shortness of breath or additional concerns aside from her back pain today.  I reached out to [place] [facility] and spoke with Dr. Glenford Peers who advised obtaining MRI lumbar spine with and without contrast.  I advised patient of this, she stated understanding, advises that she does not have a pacemaker or any MRI compatible implants.  MRI with and without contrast was ordered along with basic labs. Discussed case with Dr. Melina Copa who agrees with workup.  Note: Portions of this report may have been transcribed using voice recognition software. Every effort was made to ensure accuracy; however, inadvertent computerized transcription errors may still be present.    Gari Crown 12/10/20 1025    Hayden Rasmussen, MD 12/10/20 [phone]

## 2020-12-10 NOTE — Discharge Instructions (Signed)
Please be sure to take the medication as directed and follow-up with Dr. Venetia Constable.

## 2020-12-10 NOTE — ED Provider Notes (Signed)
5:52 PM Patient in no distress.  I discussed MRI findings with our neurosurgeon and he has seen and evaluated the patient at bedside.  Recommendation for discharge with IV steroids here, taper dose at home, surgery next week for disc herniation   Carmin Muskrat, MD 12/10/20 1753

## 2020-12-15 ENCOUNTER — Other Ambulatory Visit: Payer: Self-pay | Admitting: Neurological Surgery

## 2020-12-15 DIAGNOSIS — M5416 Radiculopathy, lumbar region: Secondary | ICD-10-CM | POA: Diagnosis not present

## 2020-12-15 DIAGNOSIS — I10 Essential (primary) hypertension: Secondary | ICD-10-CM | POA: Diagnosis not present

## 2020-12-20 ENCOUNTER — Encounter (HOSPITAL_COMMUNITY): Payer: Self-pay | Admitting: Neurological Surgery

## 2020-12-20 NOTE — Progress Notes (Signed)
DUE TO COVID-19 ONLY ONE VISITOR IS ALLOWED TO COME WITH YOU AND STAY IN THE WAITING ROOM ONLY DURING PRE OP AND PROCEDURE DAY OF SURGERY.   Two VISITORS MAY VISIT WITH YOU AFTER SURGERY IN YOUR PRIVATE ROOM DURING VISITING HOURS ONLY!  PCP - Dr Sallee Lange Cardiologist - n/a  Chest x-ray - 03/04/20 (2V) EKG - 03/04/20 Stress Test - n/a ECHO - n/a Cardiac Cath - n/a  ICD Pacemaker/Loop - n/a  Sleep Study -  Yes CPAP - does not use cpap  Do not take metformin on the morning of surgery. Patient does not check her blood sugar.  Anesthesia review: Yes  STOP now taking any Aspirin (unless otherwise instructed by your surgeon), Aleve, Naproxen, Ibuprofen, Motrin, Advil, Goody's, BC's, all herbal medications, fish oil, and all vitamins.   Coronavirus Screening Covid test is scheduled on DOS. Do you have any of the following symptoms:  Cough Yes Fever (>100.61F)  yes/no: No Runny nose No Sore throat yes/no: No Difficulty breathing/shortness of breath  yes/no: No  Have you traveled in the last 14 days and where? yes/no: No  Patient verbalized understanding of instructions that were given via phone.

## 2020-12-20 NOTE — Progress Notes (Signed)
Anesthesia Chart Review:  Case: [id number] Date/Time: 12/21/20 0830   Procedure: Right open L4-5 microdiscectomy (Right) - 3C   Anesthesia type: General   Pre-op diagnosis: Stenosis   Location: [facility] OR ROOM 21 / [place] [facility]   Surgeons: Judith Part, MD       DISCUSSION: Patient is a 60 year old female scheduled for the above procedure.  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  She is for COVID-19 testing on arrival for surgery. Labs as indicated and anesthesia team evaluation on the day of surgery.    VS: Ht 5\' 3"  (1.6 m)   Wt 86.2 kg   LMP 07/10/2013   BMI 33.66 kg/m  BP Readings from Last 3 Encounters:  12/10/20 (!) 163/87  11/22/20 123/70  06/15/20 126/84   Pulse Readings from Last 3 Encounters:  12/10/20 64  06/15/20 98  04/12/20 (!) 104     PROVIDERS: Kathyrn Drown, MD is PCP. Last visit 11/22/20.  Derek Jack, MD is hematologist. Last visit 06/04/19 with Francene Finders, NP for IDA. - She is not followed regularly by cardiology but saw Jacqulyn Ducking, MD in 2012-2013 for HTN and atypical chest pain.    LABS: Labs on arrival as indicated. Currently, last lab results include: Lab Results  Component Value Date   WBC 7.3 12/10/2020   HGB 16.6 (H) 12/10/2020   HCT 48.1 (H) 12/10/2020   PLT 271 12/10/2020   GLUCOSE 146 (H) 12/10/2020   ALT 25 11/18/2020   AST 19 11/18/2020   NA 138 12/10/2020   K 4.2 12/10/2020   CL 106 12/10/2020   CREATININE 1.10 (H) 12/10/2020   BUN 15 12/10/2020    CO2 18 (L) 12/10/2020   HGBA1C 6.8 (H) 11/18/2020     IMAGES: MRI L-spine 12/10/20: IMPRESSION: 1. Postsurgical changes at L3-L4 and L4-L5. New prominent right subarticular disc protrusion at L4-L5 with severe right lateral recess stenosis and impingement of the descending right L5 nerve root. 2. Resolved spinal canal stenosis at L3-L4.   CXR 03/04/20: FINDINGS: - Lung volumes and mediastinal contours remain normal. Both lungs appear clear. No pneumothorax or pleural effusion. Visualized tracheal air column is within normal limits. - No acute osseous abnormality identified. Increased lower thoracic endplate spurring since 2014. Negative visible bowel gas. IMPRESSION: Negative.  No acute cardiopulmonary abnormality.   EKG: 03/04/20: Normal sinus rhythm Low voltage QRS Poor anterior R wave progression No significant change since last tracing Confirmed by Minus Breeding [phone]) on 03/04/2020 4:26:47 PM   CV: N/A  Past Medical History:  Diagnosis Date   Anemia    PT HAS HAD COLONOSCOPY AND ENDOSCOPY WORK UP - NO PROBLEMS FOUND AS SOURCE OF ANEMIA -- PT HAD IRON INFUSION AT [facility] 11/13/12-AFTER SEEING HEMATOLOGIST DR. Twain Harte AND HE GAVE HEMATOLOGIC CLEARANCE FOR GASTRIC BYPASS SURGERY.   Anxiety    Asthma    daily and prn inhalers   Degenerative joint disease    right knee, spine - STATES INTERMITTENT  NUMBNESS DOWN RT LEG WITH PROLONGED STANDING OR WALKING- THINKS RELATED TO HER SPINE PROBLEMS   Dental crowns present    all permanent crowns  Depression    Diabetes mellitus, type 2 ([place])    Family history of anesthesia complication    pt's mother and sister have hx. of post-op N/V   Fibroids    UTERINE   Gastroesophageal reflux disease    none for 2 years since Bariatric surgery.   H/O hiatal hernia    History of endometriosis    History of kidney stones    passed stones   Hyperlipidemia    Hypertension    under control with med., has been on med. x 2  yr.   Lock jaw    jaw locks open if opens mouth wide   Migraines    Neuropathy    bilateral feet   Obesity    Palpitations    no current problems   PONV (postoperative nausea and vomiting)    Shortness of breath    with daily activities, albuterol inhaler   Sleep apnea    does not use cpap    Past Surgical History:  Procedure Laterality Date   ABDOMINAL HYSTERECTOMY N/A 06/06/2017   Procedure: HYSTERECTOMY ABDOMINAL;  Surgeon: Florian Buff, MD;  Location: [facility];  Service: Gynecology;  Laterality: N/A;   BACK SURGERY     BREATH TEK H PYLORI N/A 08/13/2012   Procedure: BREATH TEK H PYLORI;  Surgeon: Edward Jolly, MD;  Location: [facility];  Service: General;  Laterality: N/A;   CARPAL TUNNEL RELEASE  01/03/2012   Procedure: CARPAL TUNNEL RELEASE RIGHT HAND;  Surgeon: Wynonia Sours, MD;  Location: [place];  Service: Orthopedics;  Laterality: Right;   COLONOSCOPY  05/2009   UTM:LYYTKPTW hemorrhoids otherwise normal colon, rectum and terminal ileum   COLONOSCOPY WITH ESOPHAGOGASTRODUODENOSCOPY (EGD) N/A 10/28/2012   Procedure: COLONOSCOPY WITH ESOPHAGOGASTRODUODENOSCOPY (EGD);  Surgeon: Daneil Dolin, MD;  Location: [facility];  Service: Endoscopy;  Laterality: N/A;  7:30   DILITATION & CURRETTAGE/HYSTROSCOPY WITH NOVASURE ABLATION N/A 07/22/2014   Procedure: DILATATION & CURETTAGE/HYSTEROSCOPY WITH NOVASURE ABLATION; uterine length 6.0 cm; uterine width 3.8 cm; total ablation time 1 minute 15 seconds;  Surgeon: Florian Buff, MD;  Location: [facility];  Service: Gynecology;  Laterality: N/A;   ESOPHAGOGASTRODUODENOSCOPY  06/04/2009   SFK:CLEXNT esophagus/multiple polyps removed s/p (hyperplastic). Gastritis without H.pylori.   ESOPHAGOGASTRODUODENOSCOPY (EGD) WITH PROPOFOL N/A 06/06/2018   with LA Grade A esophagitis s/p dilation.    GASTRIC ROUX-EN-Y N/A 11/19/2012   Procedure: LAPAROSCOPIC ROUX-EN-Y GASTRIC;  Surgeon: Edward Jolly, MD;  Location:  [facility];  Service: General;  Laterality: N/A;   GIVENS CAPSULE STUDY N/A 10/28/2012   Procedure: GIVENS CAPSULE STUDY;  Surgeon: Daneil Dolin, MD;  [facility];  Service: Endoscopy;  Laterality: N/A;   KNEE ARTHROSCOPY WITH MEDIAL MENISECTOMY Right 10/28/2015   Procedure: RIGHT KNEE ARTHROSCOPY WITH MEDIAL MENISECTOMY;  Surgeon: Carole Civil, MD;  Location: [facility];  Service: Orthopedics;  Laterality: Right;   LAPAROSCOPY     due to endometriosis   LUMBAR LAMINECTOMY/DECOMPRESSION MICRODISCECTOMY N/A 02/14/2020   Procedure: LUMBAR LAMINECTOMY  Lumbar three-four, Lumbar four-five;  Surgeon: Consuella Lose, MD;  Location: [place];  Service: Neurosurgery;  Laterality: N/A;   MALONEY DILATION N/A 06/06/2018   Procedure: Venia Minks DILATION;  Surgeon: Daneil Dolin, MD;  [facility];  Service: Endoscopy;  Laterality: N/A;   PELVIC LAPAROSCOPY  11/04/1999   with fulguration of endometriosis   SALPINGOOPHORECTOMY Bilateral 06/06/2017   Procedure: SALPINGO OOPHORECTOMY;  Surgeon: Florian Buff, MD;  Location: [facility];  Service: Gynecology;  Laterality: Bilateral;   TOTAL KNEE ARTHROPLASTY Right 07/22/2019   Procedure: TOTAL KNEE ARTHROPLASTY;  Surgeon: Carole Civil, MD;  Location: [facility];  Service: Orthopedics;  Laterality: Right;   TRIGGER FINGER RELEASE Right 09/24/2012   Procedure: RELEASE A-1 PULLEY OF RIGHT THUMB;  Surgeon: Wynonia Sours, MD;  Location: [place];  Service: Orthopedics;  Laterality: Right;   TUBAL LIGATION  1993    MEDICATIONS: No current facility-administered medications for this encounter.    albuterol (VENTOLIN HFA) 108 (90 Base) MCG/ACT inhaler   amLODipine (NORVASC) 2.5 MG tablet   amoxicillin (AMOXIL) 500 MG capsule   Baclofen 5 MG TABS   cetirizine (ZYRTEC) 10 MG tablet   ergocalciferol (VITAMIN D2) 1.25 MG (50000 UT) capsule   estradiol (ESTRACE) 2 MG tablet   Ferrous Sulfate (IRON) 28 MG TABS   fluticasone  (FLOVENT HFA) 220 MCG/ACT inhaler   gabapentin (NEURONTIN) 400 MG capsule   lamoTRIgine (LAMICTAL) 100 MG tablet   losartan (COZAAR) 100 MG tablet   Menthol, Topical Analgesic, (BIOFREEZE COLORLESS) 4 % GEL   metFORMIN (GLUCOPHAGE) 850 MG tablet   Methyl Salicylate-Lido-Menthol 4-4-5 % PTCH   Multiple Vitamin (MULTIVITAMIN WITH MINERALS) TABS tablet   rosuvastatin (CRESTOR) 10 MG tablet   Semaglutide,0.25 or 0.5MG /DOS, (OZEMPIC, 0.25 OR 0.5 MG/DOSE,) 2 MG/1.5ML SOPN   venlafaxine (EFFEXOR) 75 MG tablet   Insulin Pen Needle (PEN NEEDLES) 32G X 4 MM MISC   methylPREDNISolone (MEDROL DOSEPAK) 4 MG TBPK tablet    Myra Gianotti, PA-C Surgical Short Stay/Anesthesiology [facility] Phone [phone] [facility] Phone [phone] 12/20/2020 12:52 PM

## 2020-12-20 NOTE — Anesthesia Preprocedure Evaluation (Addendum)
Anesthesia Evaluation  Patient identified by MRN, date of birth, ID band Patient awake    Reviewed: Allergy & Precautions, NPO status , Patient's Chart, lab work & pertinent test results  History of Anesthesia Complications (+) PONV and history of anesthetic complications  Airway Mallampati: II  TM Distance: >3 FB Neck ROM: Full    Dental  (+) Dental Advisory Given   Pulmonary shortness of breath, asthma , sleep apnea ,    breath sounds clear to auscultation       Cardiovascular hypertension, Pt. on medications (-) angina(-) Past MI  Rhythm:Regular     Neuro/Psych  Headaches, PSYCHIATRIC DISORDERS Anxiety Depression  Neuromuscular disease    GI/Hepatic Neg liver ROS, hiatal hernia, GERD  ,  Endo/Other  diabetes  Renal/GU Renal diseaseLab Results      Component                Value               Date                      CREATININE               1.10 (H)            12/10/2020                Musculoskeletal  (+) Arthritis ,   Abdominal   Peds  Hematology negative hematology ROS (+) Lab Results      Component                Value               Date                      WBC                      7.3                 12/10/2020                HGB                      16.6 (H)            12/10/2020                HCT                      48.1 (H)            12/10/2020                MCV                      87.5                12/10/2020                PLT                      271                 12/10/2020              Anesthesia Other Findings Patient is a 60 year old female scheduled for the above procedure.  History includes never smoker, postoperative N/V, TMJ  disorder ("jaw locks" when mouth opens too wide), HTN, DM2, neuropathy (feet), HLD, palpitations, GERD, hiatal hernia, asthma, exertional dyspnea, dental crowns, anemia, OSA (mild OSA, severe periodic limb movement disorder 02/11/12 sleep study, inconsistent use  of CPAP), obesity (s/p Gastric Roux-en-Y gastric bypass 11/19/12), spinal surgery (L3-5 laminotomy, L4-5 microdiscectomy , hysterectomy/SBO (06/06/17), osteoarthritis (right TKA 07/22/19), COVID-19 (02/13/20).  She is for COVID-19 testing on arrival for surgery. Labs as indicated and anesthesia team evaluation on the day of surgery.   Reproductive/Obstetrics                           Anesthesia Physical Anesthesia Plan  ASA: 3  Anesthesia Plan: General   Post-op Pain Management:    Induction: Intravenous  PONV Risk Score and Plan: 4 or greater and Ondansetron and Dexamethasone  Airway Management Planned: Oral ETT  Additional Equipment: None  Intra-op Plan:   Post-operative Plan: Extubation in OR  Informed Consent: I have reviewed the patients History and Physical, chart, labs and discussed the procedure including the risks, benefits and alternatives for the proposed anesthesia with the patient or authorized representative who has indicated his/her understanding and acceptance.     Dental advisory given  Plan Discussed with: CRNA and Anesthesiologist  Anesthesia Plan Comments: (PAT note written 12/20/2020 by Myra Gianotti, PA-C. )      Anesthesia Quick Evaluation

## 2020-12-21 ENCOUNTER — Ambulatory Visit (HOSPITAL_COMMUNITY): Payer: Medicare Other | Admitting: Vascular Surgery

## 2020-12-21 ENCOUNTER — Ambulatory Visit (HOSPITAL_COMMUNITY): Payer: Medicare Other

## 2020-12-21 ENCOUNTER — Other Ambulatory Visit: Payer: Self-pay

## 2020-12-21 ENCOUNTER — Encounter (HOSPITAL_COMMUNITY): Payer: Self-pay | Admitting: Neurological Surgery

## 2020-12-21 ENCOUNTER — Observation Stay (HOSPITAL_COMMUNITY)
Admission: RE | Admit: 2020-12-21 | Discharge: 2020-12-22 | Disposition: A | Discharge status: Home / Self Care | Payer: Medicare Other | Attending: Neurological Surgery | Admitting: Neurological Surgery

## 2020-12-21 ENCOUNTER — Encounter (HOSPITAL_COMMUNITY)
Admission: RE | Disposition: A | Discharge status: Home / Self Care | Payer: Self-pay | Source: Home / Self Care | Attending: Neurological Surgery

## 2020-12-21 DIAGNOSIS — J45909 Unspecified asthma, uncomplicated: Secondary | ICD-10-CM | POA: Diagnosis not present

## 2020-12-21 DIAGNOSIS — Z7984 Long term (current) use of oral hypoglycemic drugs: Secondary | ICD-10-CM | POA: Insufficient documentation

## 2020-12-21 DIAGNOSIS — Z79899 Other long term (current) drug therapy: Secondary | ICD-10-CM | POA: Diagnosis not present

## 2020-12-21 DIAGNOSIS — M48061 Spinal stenosis, lumbar region without neurogenic claudication: Secondary | ICD-10-CM | POA: Diagnosis not present

## 2020-12-21 DIAGNOSIS — E119 Type 2 diabetes mellitus without complications: Secondary | ICD-10-CM | POA: Insufficient documentation

## 2020-12-21 DIAGNOSIS — Z419 Encounter for procedure for purposes other than remedying health state, unspecified: Secondary | ICD-10-CM

## 2020-12-21 DIAGNOSIS — M5116 Intervertebral disc disorders with radiculopathy, lumbar region: Secondary | ICD-10-CM | POA: Diagnosis not present

## 2020-12-21 DIAGNOSIS — Z96651 Presence of right artificial knee joint: Secondary | ICD-10-CM | POA: Diagnosis not present

## 2020-12-21 DIAGNOSIS — E785 Hyperlipidemia, unspecified: Secondary | ICD-10-CM | POA: Diagnosis not present

## 2020-12-21 DIAGNOSIS — Z87442 Personal history of urinary calculi: Secondary | ICD-10-CM | POA: Diagnosis not present

## 2020-12-21 DIAGNOSIS — Z20822 Contact with and (suspected) exposure to covid-19: Secondary | ICD-10-CM | POA: Diagnosis not present

## 2020-12-21 DIAGNOSIS — I10 Essential (primary) hypertension: Secondary | ICD-10-CM | POA: Insufficient documentation

## 2020-12-21 DIAGNOSIS — M5416 Radiculopathy, lumbar region: Principal | ICD-10-CM | POA: Insufficient documentation

## 2020-12-21 DIAGNOSIS — Z9889 Other specified postprocedural states: Secondary | ICD-10-CM | POA: Diagnosis not present

## 2020-12-21 HISTORY — DX: Type 2 diabetes mellitus without complications: E11.9

## 2020-12-21 HISTORY — PX: LUMBAR LAMINECTOMY/DECOMPRESSION MICRODISCECTOMY: SHX5026

## 2020-12-21 LAB — SARS CORONAVIRUS 2 BY RT PCR (HOSPITAL ORDER, PERFORMED IN ~~LOC~~ HOSPITAL LAB): SARS Coronavirus 2: NEGATIVE

## 2020-12-21 LAB — GLUCOSE, CAPILLARY
Glucose-Capillary: 259 mg/dL — ABNORMAL HIGH (ref 70–99)
Glucose-Capillary: 215 mg/dL — ABNORMAL HIGH (ref 70–99)
Glucose-Capillary: 220 mg/dL — ABNORMAL HIGH (ref 70–99)
Glucose-Capillary: 344 mg/dL — ABNORMAL HIGH (ref 70–99)
Glucose-Capillary: 87 mg/dL (ref 70–99)

## 2020-12-21 LAB — SURGICAL PCR SCREEN
MRSA, PCR: NEGATIVE
Staphylococcus aureus: NEGATIVE

## 2020-12-21 SURGERY — LUMBAR LAMINECTOMY/DECOMPRESSION MICRODISCECTOMY 1 LEVEL
Anesthesia: General | Site: Back | Laterality: Right

## 2020-12-21 MED ORDER — AMLODIPINE BESYLATE 5 MG PO TABS: 2.5000 mg | ORAL_TABLET | Freq: Once | ORAL | Status: DC

## 2020-12-21 MED ORDER — LIDOCAINE 2% (20 MG/ML) 5 ML SYRINGE
INTRAMUSCULAR | Status: DC | PRN
  Administered 2020-12-21: 60 mg via INTRAVENOUS

## 2020-12-21 MED ORDER — GABAPENTIN 400 MG PO CAPS
400.0000 mg | ORAL_CAPSULE | Freq: Four times a day (QID) | ORAL | Status: DC
  Administered 2020-12-21 – 2020-12-22 (×3): 400 mg via ORAL
  Filled 2020-12-21: qty 1

## 2020-12-21 MED ORDER — BUDESONIDE 0.5 MG/2ML IN SUSP
0.5000 mg | Freq: Two times a day (BID) | RESPIRATORY_TRACT | Status: DC | PRN
  Filled 2020-12-21: qty 2

## 2020-12-21 MED ORDER — ACETAMINOPHEN 325 MG PO TABS
650.0000 mg | ORAL_TABLET | ORAL | Status: DC | PRN
  Administered 2020-12-21 – 2020-12-22 (×2): 650 mg via ORAL
  Filled 2020-12-21: qty 2

## 2020-12-21 MED ORDER — SODIUM CHLORIDE 0.9% FLUSH: 3.0000 mL | Freq: Two times a day (BID) | INTRAVENOUS | Status: DC

## 2020-12-21 MED ORDER — OXYCODONE HCL 5 MG/5ML PO SOLN: 5.0000 mg | Freq: Once | ORAL | Status: DC | PRN

## 2020-12-21 MED ORDER — FENTANYL CITRATE (PF) 100 MCG/2ML IJ SOLN
25.0000 ug | INTRAMUSCULAR | Status: DC | PRN
  Administered 2020-12-21 (×2): 50 ug via INTRAVENOUS

## 2020-12-21 MED ORDER — ACETAMINOPHEN 160 MG/5ML PO SOLN: 1000.0000 mg | Freq: Once | ORAL | Status: DC | PRN

## 2020-12-21 MED ORDER — KETAMINE HCL 10 MG/ML IJ SOLN
INTRAMUSCULAR | Status: DC | PRN
  Administered 2020-12-21: 20 mg via INTRAVENOUS
  Administered 2020-12-21: 30 mg via INTRAVENOUS

## 2020-12-21 MED ORDER — SODIUM CHLORIDE 0.9% FLUSH: 3.0000 mL | INTRAVENOUS | Status: DC | PRN

## 2020-12-21 MED ORDER — LOSARTAN POTASSIUM 50 MG PO TABS
100.0000 mg | ORAL_TABLET | Freq: Every day | ORAL | Status: DC
  Administered 2020-12-21 – 2020-12-22 (×2): 100 mg via ORAL
  Filled 2020-12-21: qty 2

## 2020-12-21 MED ORDER — FENTANYL CITRATE (PF) 250 MCG/5ML IJ SOLN
INTRAMUSCULAR | Status: AC
  Filled 2020-12-21: qty 5

## 2020-12-21 MED ORDER — BACLOFEN 10 MG PO TABS
10.0000 mg | ORAL_TABLET | Freq: Three times a day (TID) | ORAL | Status: DC
  Administered 2020-12-21 – 2020-12-22 (×2): 10 mg via ORAL
  Filled 2020-12-21: qty 1

## 2020-12-21 MED ORDER — HYDROMORPHONE HCL 1 MG/ML IJ SOLN
1.0000 mg | INTRAMUSCULAR | Status: DC | PRN
  Administered 2020-12-22: 1 mg via INTRAVENOUS
  Filled 2020-12-21: qty 1

## 2020-12-21 MED ORDER — MIDAZOLAM HCL 2 MG/2ML IJ SOLN
INTRAMUSCULAR | Status: DC | PRN
  Administered 2020-12-21: 2 mg via INTRAVENOUS

## 2020-12-21 MED ORDER — 0.9 % SODIUM CHLORIDE (POUR BTL) OPTIME
TOPICAL | Status: DC | PRN
  Administered 2020-12-21: 1000 mL

## 2020-12-21 MED ORDER — ORAL CARE MOUTH RINSE: 15.0000 mL | Freq: Once | OROMUCOSAL | Status: AC

## 2020-12-21 MED ORDER — ACETAMINOPHEN 10 MG/ML IV SOLN: 1000.0000 mg | Freq: Once | INTRAVENOUS | Status: DC | PRN

## 2020-12-21 MED ORDER — LIDOCAINE-EPINEPHRINE 1 %-1:100000 IJ SOLN
INTRAMUSCULAR | Status: DC | PRN
  Administered 2020-12-21: 10 mL

## 2020-12-21 MED ORDER — ALBUTEROL SULFATE (2.5 MG/3ML) 0.083% IN NEBU: 2.5000 mg | INHALATION_SOLUTION | Freq: Four times a day (QID) | RESPIRATORY_TRACT | Status: DC | PRN

## 2020-12-21 MED ORDER — METOPROLOL TARTRATE 5 MG/5ML IV SOLN
INTRAVENOUS | Status: AC
  Filled 2020-12-21: qty 5

## 2020-12-21 MED ORDER — POLYETHYLENE GLYCOL 3350 17 G PO PACK: 17.0000 g | PACK | Freq: Every day | ORAL | Status: DC | PRN

## 2020-12-21 MED ORDER — ALBUTEROL SULFATE HFA 108 (90 BASE) MCG/ACT IN AERS: 2.0000 | INHALATION_SPRAY | Freq: Four times a day (QID) | RESPIRATORY_TRACT | Status: DC | PRN

## 2020-12-21 MED ORDER — FENTANYL CITRATE (PF) 250 MCG/5ML IJ SOLN
INTRAMUSCULAR | Status: DC | PRN
  Administered 2020-12-21: 50 ug via INTRAVENOUS
  Administered 2020-12-21 (×2): 100 ug via INTRAVENOUS

## 2020-12-21 MED ORDER — OXYCODONE HCL 5 MG PO TABS
10.0000 mg | ORAL_TABLET | ORAL | Status: DC | PRN
  Administered 2020-12-21 – 2020-12-22 (×5): 10 mg via ORAL
  Filled 2020-12-21 (×2): qty 2

## 2020-12-21 MED ORDER — ROSUVASTATIN CALCIUM 5 MG PO TABS: 10.0000 mg | ORAL_TABLET | Freq: Every day | ORAL | Status: DC

## 2020-12-21 MED ORDER — ONDANSETRON HCL 4 MG/2ML IJ SOLN: 4.0000 mg | Freq: Four times a day (QID) | INTRAMUSCULAR | Status: DC | PRN

## 2020-12-21 MED ORDER — METHYL SALICYLATE-LIDO-MENTHOL 4-4-5 % EX PTCH: 1.0000 | MEDICATED_PATCH | Freq: Two times a day (BID) | CUTANEOUS | Status: DC

## 2020-12-21 MED ORDER — KETAMINE HCL 50 MG/5ML IJ SOSY
PREFILLED_SYRINGE | INTRAMUSCULAR | Status: AC
  Filled 2020-12-21: qty 5

## 2020-12-21 MED ORDER — ONDANSETRON HCL 4 MG PO TABS: 4.0000 mg | ORAL_TABLET | Freq: Four times a day (QID) | ORAL | Status: DC | PRN

## 2020-12-21 MED ORDER — METFORMIN HCL 850 MG PO TABS
850.0000 mg | ORAL_TABLET | Freq: Two times a day (BID) | ORAL | Status: DC
  Administered 2020-12-21 – 2020-12-22 (×2): 850 mg via ORAL
  Filled 2020-12-21 (×3): qty 1

## 2020-12-21 MED ORDER — CHLORHEXIDINE GLUCONATE 0.12 % MT SOLN
15.0000 mL | Freq: Once | OROMUCOSAL | Status: AC
  Administered 2020-12-21: 15 mL via OROMUCOSAL

## 2020-12-21 MED ORDER — CEFAZOLIN SODIUM-DEXTROSE 2-4 GM/100ML-% IV SOLN
2.0000 g | Freq: Three times a day (TID) | INTRAVENOUS | Status: AC
  Administered 2020-12-21 – 2020-12-22 (×2): 2 g via INTRAVENOUS

## 2020-12-21 MED ORDER — CEFAZOLIN SODIUM-DEXTROSE 2-4 GM/100ML-% IV SOLN
INTRAVENOUS | Status: AC
  Filled 2020-12-21: qty 100

## 2020-12-21 MED ORDER — BACLOFEN 5 MG PO TABS: 5.0000 mg | ORAL_TABLET | Freq: Three times a day (TID) | ORAL | Status: DC

## 2020-12-21 MED ORDER — MUSCLE RUB 10-15 % EX CREA
1.0000 "application " | TOPICAL_CREAM | Freq: Every day | CUTANEOUS | Status: DC | PRN
  Filled 2020-12-21: qty 85

## 2020-12-21 MED ORDER — INSULIN ASPART 100 UNIT/ML IJ SOLN
0.0000 [IU] | Freq: Three times a day (TID) | INTRAMUSCULAR | Status: DC
  Administered 2020-12-22: 3 [IU] via SUBCUTANEOUS

## 2020-12-21 MED ORDER — CEFAZOLIN SODIUM-DEXTROSE 2-4 GM/100ML-% IV SOLN
2.0000 g | INTRAVENOUS | Status: AC
  Administered 2020-12-21: 2 g via INTRAVENOUS

## 2020-12-21 MED ORDER — METOPROLOL TARTRATE 5 MG/5ML IV SOLN
INTRAVENOUS | Status: DC | PRN
  Administered 2020-12-21: 1 mg via INTRAVENOUS

## 2020-12-21 MED ORDER — THROMBIN 5000 UNITS EX SOLR: OROMUCOSAL | Status: DC | PRN

## 2020-12-21 MED ORDER — PROPOFOL 10 MG/ML IV BOLUS
INTRAVENOUS | Status: AC
  Filled 2020-12-21: qty 20

## 2020-12-21 MED ORDER — AMLODIPINE BESYLATE 5 MG PO TABS
ORAL_TABLET | ORAL | Status: AC
  Filled 2020-12-21: qty 1

## 2020-12-21 MED ORDER — LACTATED RINGERS IV SOLN: INTRAVENOUS | Status: DC

## 2020-12-21 MED ORDER — LORATADINE 10 MG PO TABS
10.0000 mg | ORAL_TABLET | Freq: Every day | ORAL | Status: DC
  Administered 2020-12-22: 10 mg via ORAL
  Filled 2020-12-21: qty 1

## 2020-12-21 MED ORDER — ACETAMINOPHEN 650 MG RE SUPP: 650.0000 mg | RECTAL | Status: DC | PRN

## 2020-12-21 MED ORDER — MENTHOL (TOPICAL ANALGESIC) 4 % EX GEL: 1.0000 "application " | Freq: Every day | CUTANEOUS | Status: DC | PRN

## 2020-12-21 MED ORDER — ACETAMINOPHEN 500 MG PO TABS: 1000.0000 mg | ORAL_TABLET | Freq: Once | ORAL | Status: DC | PRN

## 2020-12-21 MED ORDER — METHYLPREDNISOLONE ACETATE 80 MG/ML IJ SUSP
INTRAMUSCULAR | Status: AC
  Filled 2020-12-21: qty 1

## 2020-12-21 MED ORDER — IRON 28 MG PO TABS: 28.0000 mg | ORAL_TABLET | Freq: Every day | ORAL | Status: DC

## 2020-12-21 MED ORDER — LIDOCAINE 5 % EX PTCH
1.0000 | MEDICATED_PATCH | CUTANEOUS | Status: DC
  Filled 2020-12-21 (×2): qty 1

## 2020-12-21 MED ORDER — DEXAMETHASONE SODIUM PHOSPHATE 10 MG/ML IJ SOLN
INTRAMUSCULAR | Status: DC | PRN
  Administered 2020-12-21: 5 mg via INTRAVENOUS

## 2020-12-21 MED ORDER — AMLODIPINE BESYLATE 5 MG PO TABS
2.5000 mg | ORAL_TABLET | Freq: Every day | ORAL | Status: DC
  Administered 2020-12-21 – 2020-12-22 (×2): 2.5 mg via ORAL
  Filled 2020-12-21: qty 1

## 2020-12-21 MED ORDER — PHENOL 1.4 % MT LIQD: 1.0000 | OROMUCOSAL | Status: DC | PRN

## 2020-12-21 MED ORDER — MENTHOL 3 MG MT LOZG: 1.0000 | LOZENGE | OROMUCOSAL | Status: DC | PRN

## 2020-12-21 MED ORDER — OXYCODONE HCL 5 MG PO TABS: 5.0000 mg | ORAL_TABLET | ORAL | Status: DC | PRN

## 2020-12-21 MED ORDER — DOCUSATE SODIUM 100 MG PO CAPS
100.0000 mg | ORAL_CAPSULE | Freq: Two times a day (BID) | ORAL | Status: DC
  Administered 2020-12-21 – 2020-12-22 (×2): 100 mg via ORAL
  Filled 2020-12-21: qty 1

## 2020-12-21 MED ORDER — INSULIN ASPART 100 UNIT/ML IJ SOLN: 0.0000 [IU] | Freq: Every day | INTRAMUSCULAR | Status: DC

## 2020-12-21 MED ORDER — METOPROLOL TARTRATE 5 MG/5ML IV SOLN
1.0000 mg | Freq: Once | INTRAVENOUS | Status: AC
  Administered 2020-12-21: 1 mg via INTRAVENOUS

## 2020-12-21 MED ORDER — AMLODIPINE BESYLATE 5 MG PO TABS
2.5000 mg | ORAL_TABLET | Freq: Every day | ORAL | Status: DC
  Administered 2020-12-21: 2.5 mg via ORAL

## 2020-12-21 MED ORDER — SODIUM CHLORIDE 0.9 % IV SOLN: 250.0000 mL | INTRAVENOUS | Status: DC

## 2020-12-21 MED ORDER — VENLAFAXINE HCL 75 MG PO TABS
75.0000 mg | ORAL_TABLET | Freq: Two times a day (BID) | ORAL | Status: DC
  Administered 2020-12-21 – 2020-12-22 (×2): 75 mg via ORAL
  Filled 2020-12-21 (×3): qty 1

## 2020-12-21 MED ORDER — ROCURONIUM BROMIDE 10 MG/ML (PF) SYRINGE
PREFILLED_SYRINGE | INTRAVENOUS | Status: DC | PRN
  Administered 2020-12-21 (×2): 20 mg via INTRAVENOUS
  Administered 2020-12-21: 80 mg via INTRAVENOUS

## 2020-12-21 MED ORDER — MIDAZOLAM HCL 2 MG/2ML IJ SOLN
INTRAMUSCULAR | Status: AC
  Filled 2020-12-21: qty 2

## 2020-12-21 MED ORDER — PHENYLEPHRINE HCL-NACL 20-0.9 MG/250ML-% IV SOLN
INTRAVENOUS | Status: DC | PRN
  Administered 2020-12-21: 25 ug/min via INTRAVENOUS

## 2020-12-21 MED ORDER — OXYCODONE HCL 5 MG PO TABS: 5.0000 mg | ORAL_TABLET | Freq: Once | ORAL | Status: DC | PRN

## 2020-12-21 MED ORDER — LAMOTRIGINE 100 MG PO TABS
100.0000 mg | ORAL_TABLET | Freq: Two times a day (BID) | ORAL | Status: DC
  Administered 2020-12-21 – 2020-12-22 (×2): 100 mg via ORAL
  Filled 2020-12-21 (×3): qty 1

## 2020-12-21 MED ORDER — FENTANYL CITRATE (PF) 100 MCG/2ML IJ SOLN
INTRAMUSCULAR | Status: AC
  Filled 2020-12-21: qty 2

## 2020-12-21 MED ORDER — PROPOFOL 10 MG/ML IV BOLUS
INTRAVENOUS | Status: DC | PRN
  Administered 2020-12-21: 180 mg via INTRAVENOUS

## 2020-12-21 MED ORDER — BACLOFEN 10 MG PO TABS
5.0000 mg | ORAL_TABLET | Freq: Three times a day (TID) | ORAL | Status: DC
  Administered 2020-12-21: 5 mg via ORAL

## 2020-12-21 MED ORDER — CHLORHEXIDINE GLUCONATE CLOTH 2 % EX PADS: 6.0000 | MEDICATED_PAD | Freq: Once | CUTANEOUS | Status: DC

## 2020-12-21 MED ORDER — CHLORHEXIDINE GLUCONATE 0.12 % MT SOLN
OROMUCOSAL | Status: AC
  Filled 2020-12-21: qty 15

## 2020-12-21 MED ORDER — LIDOCAINE-EPINEPHRINE 1 %-1:100000 IJ SOLN
INTRAMUSCULAR | Status: AC
  Filled 2020-12-21: qty 1

## 2020-12-21 MED ORDER — THROMBIN 5000 UNITS EX SOLR
CUTANEOUS | Status: AC
  Filled 2020-12-21: qty 5000

## 2020-12-21 MED ORDER — SUGAMMADEX SODIUM 200 MG/2ML IV SOLN
INTRAVENOUS | Status: DC | PRN
  Administered 2020-12-21: 200 mg via INTRAVENOUS

## 2020-12-21 MED ORDER — ONDANSETRON HCL 4 MG/2ML IJ SOLN
INTRAMUSCULAR | Status: DC | PRN
  Administered 2020-12-21: 4 mg via INTRAVENOUS

## 2020-12-21 MED ORDER — FERROUS SULFATE 300 (60 FE) MG/5ML PO SYRP
150.0000 mg | ORAL_SOLUTION | Freq: Every day | ORAL | Status: DC
  Administered 2020-12-22: 150 mg via ORAL
  Filled 2020-12-21: qty 5

## 2020-12-21 SURGICAL SUPPLY — 52 items
ADH SKN CLS APL DERMABOND .7 (GAUZE/BANDAGES/DRESSINGS) ×1
BAG COUNTER SPONGE SURGICOUNT (BAG) ×2 IMPLANT
BAG SPNG CNTER NS LX DISP (BAG) ×1
BAND INSRT 18 STRL LF DISP RB (MISCELLANEOUS) ×2
BAND RUBBER #18 3X1/16 STRL (MISCELLANEOUS) ×4 IMPLANT
BLADE CLIPPER SURG (BLADE) IMPLANT
BLADE SURG 11 STRL SS (BLADE) ×2 IMPLANT
BUR MATCHSTICK NEURO 3.0 LAGG (BURR) ×1 IMPLANT
BUR PRECISION FLUTE 5.0 (BURR) ×1 IMPLANT
CANISTER SUCT 3000ML PPV (MISCELLANEOUS) ×2 IMPLANT
DECANTER SPIKE VIAL GLASS SM (MISCELLANEOUS) ×1 IMPLANT
DERMABOND ADVANCED (GAUZE/BANDAGES/DRESSINGS) ×1
DERMABOND ADVANCED .7 DNX12 (GAUZE/BANDAGES/DRESSINGS) ×1 IMPLANT
DRAPE C-ARM 42X72 X-RAY (DRAPES) ×4 IMPLANT
DRAPE LAPAROTOMY 100X72X124 (DRAPES) ×2 IMPLANT
DRAPE MICROSCOPE LEICA (MISCELLANEOUS) ×2 IMPLANT
DRAPE SURG 17X23 STRL (DRAPES) ×1 IMPLANT
DURAPREP 26ML APPLICATOR (WOUND CARE) ×2 IMPLANT
ELECT REM PT RETURN 9FT ADLT (ELECTROSURGICAL) ×2
ELECTRODE REM PT RTRN 9FT ADLT (ELECTROSURGICAL) ×1 IMPLANT
GAUZE 4X4 16PLY ~~LOC~~+RFID DBL (SPONGE) ×1 IMPLANT
GAUZE SPONGE 4X4 12PLY STRL (GAUZE/BANDAGES/DRESSINGS) IMPLANT
GLOVE EXAM NITRILE LRG STRL (GLOVE) IMPLANT
GLOVE EXAM NITRILE XL STR (GLOVE) IMPLANT
GLOVE EXAM NITRILE XS STR PU (GLOVE) IMPLANT
GLOVE SURG LTX SZ7.5 (GLOVE) ×2 IMPLANT
GLOVE SURG UNDER POLY LF SZ7.5 (GLOVE) ×2 IMPLANT
GOWN STRL REUS W/ TWL LRG LVL3 (GOWN DISPOSABLE) ×2 IMPLANT
GOWN STRL REUS W/ TWL XL LVL3 (GOWN DISPOSABLE) IMPLANT
GOWN STRL REUS W/TWL 2XL LVL3 (GOWN DISPOSABLE) IMPLANT
GOWN STRL REUS W/TWL LRG LVL3 (GOWN DISPOSABLE) ×6
GOWN STRL REUS W/TWL XL LVL3 (GOWN DISPOSABLE)
HEMOSTAT POWDER KIT SURGIFOAM (HEMOSTASIS) ×2 IMPLANT
KIT BASIN OR (CUSTOM PROCEDURE TRAY) ×2 IMPLANT
KIT TURNOVER KIT B (KITS) ×2 IMPLANT
NDL HYPO 18GX1.5 BLUNT FILL (NEEDLE) IMPLANT
NDL SPNL 18GX3.5 QUINCKE PK (NEEDLE) ×1 IMPLANT
NEEDLE HYPO 18GX1.5 BLUNT FILL (NEEDLE) IMPLANT
NEEDLE HYPO 22GX1.5 SAFETY (NEEDLE) ×2 IMPLANT
NEEDLE SPNL 18GX3.5 QUINCKE PK (NEEDLE) ×2 IMPLANT
NS IRRIG 1000ML POUR BTL (IV SOLUTION) ×2 IMPLANT
PACK LAMINECTOMY NEURO (CUSTOM PROCEDURE TRAY) ×2 IMPLANT
PAD ARMBOARD 7.5X6 YLW CONV (MISCELLANEOUS) ×7 IMPLANT
SPONGE T-LAP 4X18 ~~LOC~~+RFID (SPONGE) ×1 IMPLANT
SUT MNCRL AB 3-0 PS2 18 (SUTURE) ×2 IMPLANT
SUT VIC AB 0 CT1 18XCR BRD8 (SUTURE) ×1 IMPLANT
SUT VIC AB 0 CT1 8-18 (SUTURE) ×2
SUT VIC AB 2-0 CT2 18 VCP726D (SUTURE) ×2 IMPLANT
SYR 3ML LL SCALE MARK (SYRINGE) IMPLANT
TOWEL GREEN STERILE (TOWEL DISPOSABLE) ×2 IMPLANT
TOWEL GREEN STERILE FF (TOWEL DISPOSABLE) ×2 IMPLANT
WATER STERILE IRR 1000ML POUR (IV SOLUTION) ×2 IMPLANT

## 2020-12-21 NOTE — Op Note (Signed)
PATIENT: Carrie Cook  DAY OF SURGERY: 12/21/20   PRE-OPERATIVE DIAGNOSIS:  Lumbar radiculopathy   POST-OPERATIVE DIAGNOSIS:  Same   PROCEDURE:  Open right repeat L4-5 microdiscectomy   SURGEON:  Surgeon(s) and Role:    Judith Part, MD - Primary    Duffy Rhody, MD - Assisting   ANESTHESIA: ETGA   BRIEF HISTORY: This is a 60 year old woman who presented with recurrent RLE radicular pain, repeat MRI showed a recurrence of her disc herniation at L4-5 with compression of the root. She had some recurrence of her foot weakness and numbness, I therefore recommended surgery in the form of a repeat discectomy. This was discussed with the patient as well as risks, benefits, and alternatives and wished to proceed with surgery.   OPERATIVE DETAIL: The patient was taken to the operating room and anesthesia was induced by the anesthesia team. They were placed on the OR table in the prone position with padding of all pressure points. A formal time out was performed with two patient identifiers and confirmed the operative site. The patient's prior midline incision was used. This area was marked, hair was clipped with surgical clippers, the area was then prepped and draped in a sterile fashion.   The patient's prior midline incision was opened and dissection was taken down through the soft tissues. Fluoro was used to confirm the operative level and dissection was taken down to the pars of L4, given the laminar defect. Dissection was then performed from the intact pars into the scar tissue present on the right at L4-5. A high speed drill and rongeurs were used to remove some of the bone that healed over the prior laminotomy and expand the facetectomy laterally. Significant scar tissue was present and was, as expected, quite difficult to dissect through safely. I was able to get an epidural plane lateral to the thecal sac and down to the right L5 traversing root. This was dissected and protected, a  large disc herniation was clearly present and removed. With good decompression of the traversing root and thecal sac, the wound was copiously irrigated, hemostasis was obtained and confirmed, and attention was turned to closure.   All instrument and sponge counts were correct, the incision was then closed in layers. The patient was then returned to anesthesia for emergence. No apparent complications at the completion of the procedure.   EBL:  83mL   DRAINS: none   SPECIMENS: none   Judith Part, MD 12/21/20 9:08 AM

## 2020-12-21 NOTE — Progress Notes (Signed)
MD Ermalene Postin notified about pt's blood sugar 259 and BP's of 233/124 and 227/119. Pt did not take her amlodipine this AM. Asymptomatic at this time. Pt connected to cardiac monitor. Received verbal order for pt's home dose of amlodipine from Dr. Ermalene Postin.

## 2020-12-21 NOTE — H&P (Signed)
Surgical H&P Update  HPI: 60 y.o. woman with right lower extremity radicular pain. Her symptoms persist today, same location and quality, but unfortunately they have worsened and she was tearful during my exam. No worsening of her weakness or new numbness, no new bowel/bladder involvement. No changes in health since she was last seen.   PMHx:  Past Medical History:  Diagnosis Date   Anemia    PT HAS HAD COLONOSCOPY AND ENDOSCOPY WORK UP - NO PROBLEMS FOUND AS SOURCE OF ANEMIA -- PT HAD IRON INFUSION AT [facility] 11/13/12-AFTER SEEING HEMATOLOGIST DR. Judson AND HE GAVE HEMATOLOGIC CLEARANCE FOR GASTRIC BYPASS SURGERY.   Anxiety    Asthma    daily and prn inhalers   Degenerative joint disease    right knee, spine - STATES INTERMITTENT  NUMBNESS DOWN RT LEG WITH PROLONGED STANDING OR WALKING- THINKS RELATED TO HER SPINE PROBLEMS   Dental crowns present    all permanent crowns   Depression    Diabetes mellitus, type 2 ([place])    Family history of anesthesia complication    pt's mother and sister have hx. of post-op N/V   Fibroids    UTERINE   Gastroesophageal reflux disease    none for 2 years since Bariatric surgery.   H/O hiatal hernia    History of endometriosis    History of kidney stones    passed stones   Hyperlipidemia    Hypertension    under control with med., has been on med. x 2 yr.   Lock jaw    jaw locks open if opens mouth wide   Migraines    Neuropathy    bilateral feet   Obesity    Palpitations    no current problems   PONV (postoperative nausea and vomiting)    Shortness of breath    with daily activities, albuterol inhaler   Sleep apnea    does not use cpap   FamHx:  Family History  Problem Relation Age of Onset   Hypertension Father    Hyperlipidemia Father    COPD Father    COPD Mother    Heart disease Mother    Cancer Mother        Cervix   Anesthesia problems Mother        post-op N/V   Thyroid disease Mother    Diabetes Maternal  Grandfather    Thyroid disease Sister    Anesthesia problems Sister        post-op N/V   Thyroid disease Paternal Uncle    Diabetes Other    Colon cancer Neg Hx    Colon polyps Neg Hx    SocHx:  reports that she has never smoked. She has never used smokeless tobacco. She reports that she does not drink alcohol and does not use drugs.  Physical Exam: AOx3, PERRL, FS, TM  Strength 5/5 x4 except right EHL 4 to 4+/5, SILTx4 except right L5 numbness  Assesment/Plan: 60 y.o. woman with recurrent HNP with L5 radic, here for right open L4-5 microdiscectomy. Risks, benefits, and alternatives discussed and the patient would like to continue with surgery.  -OR today -3C post-op  Judith Part, MD 12/21/20 9:05 AM

## 2020-12-21 NOTE — Transfer of Care (Signed)
Immediate Anesthesia Transfer of Care Note  Patient: Carrie Cook  Procedure(s) Performed: Right open Lumbar four-five microdiscectomy (Right: Back)  Patient Location: PACU  Anesthesia Type:General  Level of Consciousness: sedated  Airway & Oxygen Therapy: Patient Spontanous Breathing and Patient connected to nasal cannula oxygen  Post-op Assessment: Report given to RN and Post -op Vital signs reviewed and stable  Post vital signs: Reviewed and stable  Last Vitals:  Vitals Value Taken Time  BP 149/92 12/21/20 1201  Temp 36.9 C 12/21/20 1155  Pulse 101 12/21/20 1202  Resp 17 12/21/20 1202  SpO2 97 % 12/21/20 1202  Vitals shown include unvalidated device data.  Last Pain:  Vitals:   12/21/20 0652  TempSrc:   PainSc: 10-Worst pain ever      Patients Stated Pain Goal: 4 (21/19/41 7408)  Complications: No notable events documented.

## 2020-12-21 NOTE — Progress Notes (Signed)
Inpatient Diabetes Program Recommendations  AACE/ADA: New Consensus Statement on Inpatient Glycemic Control (2015)  Target Ranges:  Prepandial:   less than 140 mg/dL      Peak postprandial:   less than 180 mg/dL (1-2 hours)      Critically ill patients:  140 - 180 mg/dL   Lab Results  Component Value Date   GLUCAP 220 (H) 12/21/2020   HGBA1C 6.8 (H) 11/18/2020    Review of Glycemic Control  Latest Reference Range & Units 12/21/20 06:29 12/21/20 08:37 12/21/20 12:27  Glucose-Capillary 70 - 99 mg/dL 259 (H) 215 (H) 220 (H)   Diabetes history: DM 2 Outpatient Diabetes medications:  Metformin 850 mg bid, Ozempic 0.25 mg weekly Current orders for Inpatient glycemic control:  Metformin 850 mg bid  Inpatient Diabetes Program Recommendations:    While in the hospital, please add Novolog moderate correction tid with meals and HS.    Thanks,  Adah Perl, RN, BC-ADM Inpatient Diabetes Coordinator Pager [phone]  (8a-5p)

## 2020-12-21 NOTE — Anesthesia Procedure Notes (Signed)
Procedure Name: Intubation Date/Time: 12/21/2020 9:42 AM Performed by: Leonor Liv, CRNA Pre-anesthesia Checklist: Patient identified, Emergency Drugs available, Suction available and Patient being monitored Patient Re-evaluated:Patient Re-evaluated prior to induction Oxygen Delivery Method: Circle System Utilized Preoxygenation: Pre-oxygenation with 100% oxygen Induction Type: IV induction Ventilation: Mask ventilation without difficulty and Oral airway inserted - appropriate to patient size Laryngoscope Size: Mac and 3 Grade View: Grade I Tube type: Oral Tube size: 7.0 mm Number of attempts: 1 Airway Equipment and Method: Stylet and Oral airway Placement Confirmation: ETT inserted through vocal cords under direct vision, positive ETCO2 and breath sounds checked- equal and bilateral Secured at: 21 cm Tube secured with: Tape Dental Injury: Teeth and Oropharynx as per pre-operative assessment

## 2020-12-22 DIAGNOSIS — M5416 Radiculopathy, lumbar region: Secondary | ICD-10-CM | POA: Diagnosis not present

## 2020-12-22 LAB — GLUCOSE, CAPILLARY: Glucose-Capillary: 182 mg/dL — ABNORMAL HIGH (ref 70–99)

## 2020-12-22 MED ORDER — BACLOFEN 10 MG PO TABS: 10.0000 mg | ORAL_TABLET | Freq: Three times a day (TID) | ORAL | 2 refills | Status: DC | PRN

## 2020-12-22 MED ORDER — OXYCODONE HCL 5 MG PO TABS: 5.0000 mg | ORAL_TABLET | ORAL | 0 refills | Status: DC | PRN

## 2020-12-22 NOTE — Evaluation (Signed)
Physical Therapy Evaluation and Discharge Patient Details Name: Carrie Cook MRN: [id number] DOB: 1960/11/19 Today's Date: 12/22/2020  History of Present Illness  60 y.o. female s/p open right repeat L4-5 microdiscectomy 11/29. PMHx: anemia, anxiety, asthma, hyperlipidemia, hypertension & right L3-4, L4-5 decompression 1/22 & Bilateral L3-4, L4-5 laminotomy.  Clinical Impression  Patient evaluated by Physical Therapy with no further acute PT needs identified. Pt reporting spasms into bilateral buttocks. Performed gentle stretching in supine position with muscle energy technique. Overall, pt is moving fairly well. Ambulating 200 feet with a walker without physical assist. Education provided regarding spinal precautions for comfort, exercise recommendations, car transfer technique. All education has been completed and the patient has no further questions. No follow-up Physical Therapy or equipment needs. PT is signing off. Thank you for this referral.       Recommendations for follow up therapy are one component of a multi-disciplinary discharge planning process, led by the attending physician.  Recommendations may be updated based on patient status, additional functional criteria and insurance authorization.  Follow Up Recommendations No PT follow up    Assistance Recommended at Discharge PRN  Functional Status Assessment Patient has had a recent decline in their functional status and demonstrates the ability to make significant improvements in function in a reasonable and predictable amount of time.   Equipment Recommendations  None recommended by PT (pt has needed DME)    Recommendations for Other Services       Precautions / Restrictions Precautions Precautions: Back;Fall Precaution Booklet Issued: Yes (comment) Precaution Comments: no brace needed Restrictions Weight Bearing Restrictions: No      Mobility  Bed Mobility Overal bed mobility: Modified Independent Bed  Mobility: Rolling;Sidelying to Sit;Sit to Sidelying Rolling: Supervision Sidelying to sit: Supervision     Sit to sidelying: Supervision General bed mobility comments: Good log roll technique, use of rail. Increased time/effort    Transfers Overall transfer level: Modified independent Equipment used: Rolling walker (2 wheels) Transfers: Sit to/from Stand Sit to Stand: Min guard           General transfer comment: Increased time to rise    Ambulation/Gait Ambulation/Gait assistance: Modified independent (Device/Increase time) Gait Distance (Feet): 200 Feet Assistive device: Rolling walker (2 wheels) Gait Pattern/deviations: Step-through pattern;Decreased stride length Gait velocity: decreased     General Gait Details: Slow and steady pace, light reliance through arms on walker, good posture overall  Stairs            Wheelchair Mobility    Modified Rankin (Stroke Patients Only)       Balance Overall balance assessment: Needs assistance Sitting-balance support: Feet supported Sitting balance-Leahy Scale: Good     Standing balance support: No upper extremity supported;During functional activity Standing balance-Leahy Scale: Fair                               Pertinent Vitals/Pain Pain Assessment: Faces Faces Pain Scale: Hurts even more Pain Location: back Pain Descriptors / Indicators: Discomfort;Grimacing;Guarding Pain Intervention(s): Monitored during session;Repositioned;Limited activity within patient's tolerance;Utilized relaxation techniques    Home Living Family/patient expects to be discharged to:: Private residence Living Arrangements: Spouse/significant other Available Help at Discharge: Available PRN/intermittently Type of Home: House Home Access: Stairs to enter Entrance Stairs-Rails: None Entrance Stairs-Number of Steps: 3   Home Layout: One level Home Equipment: Conservation officer, nature (2 wheels);Rollator (4 wheels);Cane - single  point;Shower seat;Toilet riser;Wheelchair - manual Additional Comments: husband works during  the day    Prior Function Prior Level of Function : Independent/Modified Independent             Mobility Comments: uses cane or RW for mobility ADLs Comments: mod I, drives, 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, newspaper   Dominant Hand: Right    Extremity/Trunk Assessment   Upper Extremity Assessment Upper Extremity Assessment: Overall WFL for tasks assessed    Lower Extremity Assessment Lower Extremity Assessment: RLE deficits/detail;LLE deficits/detail RLE Deficits / Details: Strength 5/5 LLE Deficits / Details: Strength 5/5    Cervical / Trunk Assessment Cervical / Trunk Assessment: Back Surgery  Communication   Communication: No difficulties  Cognition Arousal/Alertness: Awake/alert Behavior During Therapy: WFL for tasks assessed/performed Overall Cognitive Status: Within Functional Limits for tasks assessed                                 General Comments: recalled 3/3 back precautions and demonstrated great understanding        General Comments General comments (skin integrity, edema, etc.): VSS on RA, no family present    Exercises Other Exercises Other Exercises: Supine: manual RLE hamstring stretch with MET, knee to chest x 1 minute each   Assessment/Plan    PT Assessment Patient does not need any further PT services  PT Problem List         PT Treatment Interventions      PT Goals (Current goals can be found in the Care Plan section)  Acute Rehab PT Goals Patient Stated Goal: less pain PT Goal Formulation: All assessment and education complete, DC therapy    Frequency     Barriers to discharge        Co-evaluation               AM-PAC PT "6 Clicks" Mobility  Outcome Measure Help needed turning from your back to your side while in a flat bed without using bedrails?: None Help needed moving from lying on your back to sitting on the  side of a flat bed without using bedrails?: None Help needed moving to and from a bed to a chair (including a wheelchair)?: None Help needed standing up from a chair using your arms (e.g., wheelchair or bedside chair)?: None Help needed to walk in hospital room?: None Help needed climbing 3-5 steps with a railing? : A Little 6 Click Score: 23    End of Session   Activity Tolerance: Patient tolerated treatment well Patient left: in bed;with call bell/phone within reach Nurse Communication: Mobility status PT Visit Diagnosis: Pain;Difficulty in walking, not elsewhere classified (R26.2) Pain - part of body:  (back)    Time: 9242-6834 PT Time Calculation (min) (ACUTE ONLY): 16 min   Charges:   PT Evaluation $PT Eval Low Complexity: 1 Low          Carrie Cook, PT, DPT Acute Rehabilitation Services Pager [phone] Office [phone]   Carrie Cook 12/22/2020, 10:33 AM

## 2020-12-22 NOTE — Progress Notes (Signed)
Neurosurgery Service Progress Note  Subjective: No acute events overnight, radic pain / RLE numbness / weakness all improved, expected incisional pain  Objective: Vitals:   12/21/20 1417 12/21/20 1928 12/21/20 2250 12/22/20 0341  BP: (!) 147/84 136/89 124/64 109/66  Pulse: 94 (!) 106 93 94  Resp: 19 18 18 18   Temp: 98 F (36.7 C) 99.2 F (37.3 C) 98.5 F (36.9 C) 99.5 F (37.5 C)  TempSrc:  Oral Oral Oral  SpO2: 97% 97% 98% 97%  Weight:      Height:        Physical Exam: Strength 5/5 x4, SILTx4, incision c/d/i  Assessment & Plan: 60 y.o. woman s/p repeat open LMD, recovering well.  -discharge home today  Judith Part  12/22/20 8:44 AM

## 2020-12-22 NOTE — Progress Notes (Signed)
Occupational Therapy Evaluation Patient Details Name: Carrie Cook MRN: [id number] DOB: 1960/06/23 Today's Date: 12/22/2020   History of Present Illness 60 y.o. female s/p open right repeat L4-5 microdiscectomy 11/29. PMHx: anemia, anxiety, asthma, hyperlipidemia, hypertension & right L3-4, L4-5 decompression 1/22 & Bilateral L3-4, L4-5 laminotomy.   Clinical Impression   [name] was mod I PTA. She lives in a 1 level home with 3 STE with her husband who works outside of the home during the day. Upon evaluation pt was limited by pain and poor activity tolerance. She ambulated within the room without AD given min guard for safety, encouraged pt to use RW for home mobility initially to increase safety. She demonstrated great understanding of back precautions during ADLs given supervision - min guard for safety. Pt verbalized understanding of compensatory techniques and safety for back precautions. She does not have further acute OT needs. Recommend pt d/c home.      Recommendations for follow up therapy are one component of a multi-disciplinary discharge planning process, led by the attending physician.  Recommendations may be updated based on patient status, additional functional criteria and insurance authorization.   Follow Up Recommendations  No OT follow up    Assistance Recommended at Discharge Intermittent Supervision/Assistance  Functional Status Assessment  Patient has had a recent decline in their functional status and demonstrates the ability to make significant improvements in function in a reasonable and predictable amount of time.  Equipment Recommendations  None recommended by OT    Recommendations for Other Services       Precautions / Restrictions Precautions Precautions: Back;Fall Precaution Booklet Issued: Yes (comment) Precaution Comments: no brace needed Restrictions Weight Bearing Restrictions: No      Mobility Bed Mobility Overal bed mobility: Needs  Assistance Bed Mobility: Rolling;Sidelying to Sit;Sit to Sidelying Rolling: Supervision Sidelying to sit: Supervision     Sit to sidelying: Supervision General bed mobility comments: for safety only    Transfers Overall transfer level: Needs assistance Equipment used: None Transfers: Sit to/from Stand Sit to Stand: Min guard           General transfer comment: for safety      Balance Overall balance assessment: Needs assistance Sitting-balance support: Feet supported Sitting balance-Leahy Scale: Fair     Standing balance support: No upper extremity supported;During functional activity Standing balance-Leahy Scale: Fair                             ADL either performed or assessed with clinical judgement   ADL Overall ADL's : Needs assistance/impaired Eating/Feeding: Independent;Sitting   Grooming: Oral care;Cueing for compensatory techniques;Cueing for safety;Min guard   Upper Body Bathing: Supervision/ safety;Cueing for compensatory techniques;Sitting   Lower Body Bathing: Supervison/ safety;Sit to/from stand;Adhering to back precautions;Cueing for compensatory techniques   Upper Body Dressing : Set up;Sitting   Lower Body Dressing: Min guard;Cueing for compensatory techniques;Cueing for safety   Toilet Transfer: Min guard;Ambulation   Toileting- Clothing Manipulation and Hygiene: Supervision/safety;Sitting/lateral lean;Cueing for back precautions       Functional mobility during ADLs: Min guard General ADL Comments: assistance for back precautions and safety. Pt ambulated without RW however recommend pt use RW for increased safety     Vision Baseline Vision/History: 0 No visual deficits Ability to See in Adequate Light: 0 Adequate Patient Visual Report: No change from baseline Vision Assessment?: No apparent visual deficits     Perception     Praxis  Pertinent Vitals/Pain Pain Assessment: Faces Faces Pain Scale: Hurts even  more Pain Location: back Pain Descriptors / Indicators: Discomfort;Grimacing;Guarding Pain Intervention(s): Limited activity within patient's tolerance;Monitored during session     Hand Dominance Right   Extremity/Trunk Assessment Upper Extremity Assessment Upper Extremity Assessment: Overall WFL for tasks assessed   Lower Extremity Assessment Lower Extremity Assessment: Defer to PT evaluation   Cervical / Trunk Assessment Cervical / Trunk Assessment: Back Surgery   Communication Communication Communication: No difficulties   Cognition Arousal/Alertness: Awake/alert Behavior During Therapy: WFL for tasks assessed/performed Overall Cognitive Status: Within Functional Limits for tasks assessed                                 General Comments: recalled 3/3 back precautions and demonstrated great understanding     General Comments  VSS on RA, no family present    Exercises     Shoulder Instructions      Home Living Family/patient expects to be discharged to:: Private residence Living Arrangements: Spouse/significant other Available Help at Discharge: Available PRN/intermittently Type of Home: House Home Access: Stairs to enter [employer] of Steps: 3 Entrance Stairs-Rails: None Home Layout: One level     Bathroom Shower/Tub: Occupational psychologist: Handicapped height Bathroom Accessibility: Yes   Home Equipment: Conservation officer, nature (2 wheels);Rollator (4 wheels);Cane - single point;Shower seat;Toilet riser;Wheelchair - manual   Additional Comments: husband works during the day      Prior Functioning/Environment Prior Level of Function : Independent/Modified Independent             Mobility Comments: uses cane or RW for mobility ADLs Comments: mod I, drives, does not work        OT Problem List: Decreased strength;Decreased activity tolerance;Decreased range of motion;Impaired balance (sitting and/or standing);Decreased  knowledge of use of DME or AE;Decreased safety awareness;Decreased knowledge of precautions;Pain      OT Treatment/Interventions:      OT Goals(Current goals can be found in the care plan section) Acute Rehab OT Goals Patient Stated Goal: home OT Goal Formulation: All assessment and education complete, DC therapy  OT Frequency:     Barriers to D/C:            Co-evaluation              AM-PAC OT "6 Clicks" Daily Activity     Outcome Measure Help from another person eating meals?: None Help from another person taking care of personal grooming?: A Little Help from another person toileting, which includes using toliet, bedpan, or urinal?: A Little Help from another person bathing (including washing, rinsing, drying)?: A Little Help from another person to put on and taking off regular upper body clothing?: A Little Help from another person to put on and taking off regular lower body clothing?: A Little 6 Click Score: 19   End of Session Equipment Utilized During Treatment: Rolling walker (2 wheels) Nurse Communication: Mobility status  Activity Tolerance: Patient tolerated treatment well Patient left: in bed;with call bell/phone within reach  OT Visit Diagnosis: Other abnormalities of gait and mobility (R26.89);Muscle weakness (generalized) (M62.81);Pain                Time: 3825-0539 OT Time Calculation (min): 12 min Charges:  OT General Charges $OT Visit: 1 Visit OT Evaluation $OT Eval Low Complexity: 1 Low    Dejah Droessler A Ayda Tancredi 12/22/2020, 8:29 AM

## 2020-12-22 NOTE — Discharge Summary (Signed)
Discharge Summary  Date of Admission: 12/21/2020  Date of Discharge: 12/22/20  Attending Physician: Emelda Brothers, MD  Hospital Course: Patient was admitted following an uncomplicated repeat open Q7-2 lumbar microdiscectomy. She was recovered in PACU and transferred to [facility]. Her preop symptoms were resolved post-op, her hospital course was uncomplicated and the patient was discharged home on 12/22/20. She will follow up in clinic with me in 2 weeks.  Neurologic exam at discharge:  Strength 5/5 x4, SILTx4  Discharge diagnosis: Lumbar radiculopathy  Judith Part, MD 12/22/20 8:49 AM

## 2020-12-22 NOTE — Progress Notes (Signed)
Pt doing well. Pt given D/C instructions with verbal understanding. Rx's were sent to the pharmacy by MD. Pt's incision is clean and dry with no sign of infection. Pt's IV was removed prior to D/C. Pt D/C'd home via wheelchair per MD order. Pt is stable @ D/C and has no other needs at this time. Lasheena Frieze, RN  

## 2020-12-23 ENCOUNTER — Encounter (HOSPITAL_COMMUNITY): Payer: Self-pay | Admitting: Neurological Surgery

## 2020-12-25 NOTE — Anesthesia Postprocedure Evaluation (Signed)
Anesthesia Post Note  Patient: Carrie Cook  Procedure(s) Performed: Right open Lumbar four-five microdiscectomy (Right: Back)     Patient location during evaluation: PACU Anesthesia Type: General Level of consciousness: awake and alert Pain management: pain level controlled Vital Signs Assessment: post-procedure vital signs reviewed and stable Respiratory status: spontaneous breathing, nonlabored ventilation, respiratory function stable and patient connected to nasal cannula oxygen Cardiovascular status: blood pressure returned to baseline and stable Postop Assessment: no apparent nausea or vomiting Anesthetic complications: no   No notable events documented.  Last Vitals:  Vitals:   12/21/20 2250 12/22/20 0341  BP: 124/64 109/66  Pulse: 93 94  Resp: 18 18  Temp: 36.9 C 37.5 C  SpO2: 98% 97%    Last Pain:  Vitals:   12/22/20 1030  TempSrc:   PainSc: 4                  Shaleen Talamantez

## 2021-01-26 DIAGNOSIS — F339 Major depressive disorder, recurrent, unspecified: Secondary | ICD-10-CM | POA: Diagnosis not present

## 2021-02-18 DIAGNOSIS — I10 Essential (primary) hypertension: Secondary | ICD-10-CM | POA: Diagnosis not present

## 2021-02-18 DIAGNOSIS — E785 Hyperlipidemia, unspecified: Secondary | ICD-10-CM | POA: Diagnosis not present

## 2021-02-18 DIAGNOSIS — E1169 Type 2 diabetes mellitus with other specified complication: Secondary | ICD-10-CM | POA: Diagnosis not present

## 2021-02-19 LAB — HEMOGLOBIN A1C
Est. average glucose Bld gHb Est-mCnc: 143 mg/dL
Hgb A1c MFr Bld: 6.6 % — ABNORMAL HIGH (ref 4.8–5.6)

## 2021-02-19 LAB — BASIC METABOLIC PANEL
BUN/Creatinine Ratio: 11 (ref 9–23)
BUN: 9 mg/dL (ref 6–24)
CO2: 24 mmol/L (ref 20–29)
Calcium: 9.5 mg/dL (ref 8.7–10.2)
Chloride: 101 mmol/L (ref 96–106)
Creatinine, Ser: 0.79 mg/dL (ref 0.57–1.00)
Glucose: 116 mg/dL — ABNORMAL HIGH (ref 70–99)
Potassium: 4.4 mmol/L (ref 3.5–5.2)
Sodium: 140 mmol/L (ref 134–144)
eGFR: 87 mL/min/{1.73_m2} (ref >59)

## 2021-02-22 ENCOUNTER — Ambulatory Visit: Payer: Medicare Other | Admitting: Family Medicine

## 2021-03-03 ENCOUNTER — Encounter: Payer: Self-pay | Admitting: Family Medicine

## 2021-03-03 ENCOUNTER — Other Ambulatory Visit: Payer: Self-pay

## 2021-03-03 ENCOUNTER — Ambulatory Visit (INDEPENDENT_AMBULATORY_CARE_PROVIDER_SITE_OTHER): Payer: Medicare Other | Admitting: Family Medicine

## 2021-03-03 VITALS — BP 134/88 | HR 106 | Temp 97.6°F | Ht 63.0 in | Wt 173.0 lb

## 2021-03-03 DIAGNOSIS — Z794 Long term (current) use of insulin: Secondary | ICD-10-CM | POA: Diagnosis not present

## 2021-03-03 DIAGNOSIS — I10 Essential (primary) hypertension: Secondary | ICD-10-CM

## 2021-03-03 DIAGNOSIS — E119 Type 2 diabetes mellitus without complications: Secondary | ICD-10-CM

## 2021-03-03 DIAGNOSIS — E785 Hyperlipidemia, unspecified: Secondary | ICD-10-CM | POA: Diagnosis not present

## 2021-03-03 DIAGNOSIS — D509 Iron deficiency anemia, unspecified: Secondary | ICD-10-CM | POA: Diagnosis not present

## 2021-03-03 DIAGNOSIS — E1169 Type 2 diabetes mellitus with other specified complication: Secondary | ICD-10-CM

## 2021-03-03 NOTE — Progress Notes (Signed)
° °  Subjective:    Patient ID: Carrie Cook, female    DOB: Sep 19, 1960, 61 y.o.   MRN: [id number]  Hypertension This is a chronic problem. Treatments tried: amlodipine, lisinopril.  3 month follow up  Had back surgery Dec 21, 2020- has dizziness and headaches related to back  Patient still has numbness in her legs with some sharp pains and discomforts.  Denies any other major setbacks.  Her sugars been under fair control She does take her blood pressure medicine regular basis She is trying to watch her portions and dietary selections to keep her weight under control She also takes her cholesterol medicine and tolerates it well Recent kidney functions look good Recent A1c 6.6  Review of Systems     Objective:   Physical Exam General-in no acute distress Eyes-no discharge Lungs-respiratory rate normal, CTA CV-no murmurs,RRR Extremities skin warm dry no edema Neuro grossly normal Behavior normal, alert        Assessment & Plan:  1. Primary hypertension Blood pressure good control continue current measures watch diet take medication - Hemoglobin R3U - Basic metabolic panel - Lipid panel - Hepatic function panel - Urine Microalbumin w/creat. ratio - Ferritin - CBC with Differential/Platelet - Iron Binding Cap (TIBC)([facility])  2. Hyperlipidemia associated with type 2 diabetes mellitus ([place]) Continue medication check lab work healthy diet regular activity follow-up within 4 months - Hemoglobin Y2B - Basic metabolic panel - Lipid panel - Hepatic function panel - Urine Microalbumin w/creat. ratio - Ferritin - CBC with Differential/Platelet - Iron Binding Cap (TIBC)([facility])  3. Type 2 diabetes mellitus without complication, with long-term current use of insulin (H[facility]) A1c under good control watch diet portion control continue medications if any low sugar spells to notify us - Hemoglobin X4D - Basic metabolic panel - Lipid panel - Hepatic function  panel - Urine Microalbumin w/creat. ratio - Ferritin - CBC with Differential/Platelet - Iron Binding Cap (TIBC)([facility])  4. Iron deficiency anemia, unspecified iron deficiency anemia type History iron deficient anemia recheck ferritin TIBC CBC patient denies any rectal bleeding - Hemoglobin H6Y - Basic metabolic panel - Lipid panel - Hepatic function panel - Urine Microalbumin w/creat. ratio - Ferritin - CBC with Differential/Platelet - Iron Binding Cap (TIBC)([facility])

## 2021-03-23 DIAGNOSIS — F339 Major depressive disorder, recurrent, unspecified: Secondary | ICD-10-CM | POA: Diagnosis not present

## 2021-03-29 ENCOUNTER — Telehealth: Payer: Self-pay | Admitting: *Deleted

## 2021-03-29 MED ORDER — OZEMPIC (0.25 OR 0.5 MG/DOSE) 2 MG/1.5ML ~~LOC~~ SOPN: PEN_INJECTOR | SUBCUTANEOUS | 2 refills | Status: DC

## 2021-03-29 NOTE — Telephone Encounter (Signed)
Patient requesting refill of Ozempic 0.5 to [facility] on [address] ?

## 2021-03-29 NOTE — Telephone Encounter (Signed)
Prescription sent electronically to pharmacy. Patient notified. 

## 2021-03-29 NOTE — Telephone Encounter (Signed)
May refill for 3 months ?

## 2021-04-06 DIAGNOSIS — M5416 Radiculopathy, lumbar region: Secondary | ICD-10-CM | POA: Diagnosis not present

## 2021-04-06 DIAGNOSIS — M5412 Radiculopathy, cervical region: Secondary | ICD-10-CM | POA: Diagnosis not present

## 2021-04-06 DIAGNOSIS — I10 Essential (primary) hypertension: Secondary | ICD-10-CM | POA: Diagnosis not present

## 2021-04-06 DIAGNOSIS — Z6830 Body mass index (BMI) 30.0-30.9, adult: Secondary | ICD-10-CM | POA: Diagnosis not present

## 2021-04-20 DIAGNOSIS — F339 Major depressive disorder, recurrent, unspecified: Secondary | ICD-10-CM | POA: Diagnosis not present

## 2021-04-21 ENCOUNTER — Other Ambulatory Visit: Payer: Self-pay | Admitting: Obstetrics & Gynecology

## 2021-04-21 ENCOUNTER — Telehealth: Payer: Self-pay | Admitting: Family Medicine

## 2021-04-21 NOTE — Telephone Encounter (Signed)
?  Left message for patient to call back and schedule Medicare Annual Wellness Visit (AWV) in office.  ? ?If unable to come into the office for AWV,  please offer to do virtually or by telephone. ? ?No hx of AWV eligible for AWVI per [employer] as of 07/23/2012 ? ?Please schedule at anytime with [facility]-Nurse Health Advisor.     ? ?45 minute appointment  ? ?Any questions, please call me at [phone]   ?

## 2021-05-03 ENCOUNTER — Ambulatory Visit (INDEPENDENT_AMBULATORY_CARE_PROVIDER_SITE_OTHER): Payer: Medicare Other

## 2021-05-03 VITALS — Ht 63.0 in | Wt 171.0 lb

## 2021-05-03 DIAGNOSIS — Z0000 Encounter for general adult medical examination without abnormal findings: Secondary | ICD-10-CM

## 2021-05-03 DIAGNOSIS — Z596 Low income: Secondary | ICD-10-CM

## 2021-05-03 DIAGNOSIS — Z794 Long term (current) use of insulin: Secondary | ICD-10-CM | POA: Diagnosis not present

## 2021-05-03 DIAGNOSIS — E119 Type 2 diabetes mellitus without complications: Secondary | ICD-10-CM | POA: Diagnosis not present

## 2021-05-03 DIAGNOSIS — I10 Essential (primary) hypertension: Secondary | ICD-10-CM | POA: Diagnosis not present

## 2021-05-03 DIAGNOSIS — E785 Hyperlipidemia, unspecified: Secondary | ICD-10-CM

## 2021-05-03 DIAGNOSIS — H539 Unspecified visual disturbance: Secondary | ICD-10-CM

## 2021-05-03 DIAGNOSIS — E1169 Type 2 diabetes mellitus with other specified complication: Secondary | ICD-10-CM | POA: Diagnosis not present

## 2021-05-03 NOTE — Progress Notes (Signed)
? ?Subjective:  ? Carrie Cook is a 61 y.o. female who presents for an Initial Medicare Annual Wellness Visit. ? ?Review of Systems    ? ?Cardiac Risk Factors include: diabetes mellitus;dyslipidemia;hypertension;sedentary lifestyle;obesity (BMI >30kg/m2) ? ?   ?Objective:  ?  ?Today's Vitals  ? 05/03/21 0857 05/03/21 0902  ?Weight: 171 lb (77.6 kg)   ?Height: '5\' 3"'$  (1.6 m)   ?PainSc:  3   ? ?Body mass index is 30.29 kg/m?. ? ? ?  05/03/2021  ?  9:25 AM 12/21/2020  ?  6:56 AM 03/17/2020  ?  3:47 PM 02/24/2020  ?  6:19 PM 02/14/2020  ?  8:57 PM 02/13/2020  ?  6:49 PM 08/11/2019  ?  9:51 AM  ?Advanced Directives  ?Does Patient Have a Medical Advance Directive? No No No No  No No  ?Would patient like information on creating a medical advance directive? No - Patient declined No - Patient declined No - Patient declined No - Patient declined No - Patient declined  No - Patient declined  ? ? ?Current Medications (verified) ?Outpatient Encounter Medications as of 05/03/2021  ?Medication Sig  ? albuterol (VENTOLIN HFA) 108 (90 Base) MCG/ACT inhaler Inhale 2 puffs into the lungs every 6 (six) hours as needed for wheezing.  ? amLODipine (NORVASC) 2.5 MG tablet Take 1 tablet (2.5 mg total) by mouth daily.  ? amoxicillin (AMOXIL) 500 MG capsule Take 2,000 mg by mouth See admin instructions. Take 4 capsules (2000 mg) by mouth 1 hour prior to dental appointment  ? cetirizine (ZYRTEC) 10 MG tablet Take 10 mg by mouth daily.  ? desvenlafaxine (PRISTIQ) 100 MG 24 hr tablet Take by mouth.  ? ergocalciferol (VITAMIN D2) 1.25 MG (50000 UT) capsule Take 1 capsule (50,000 Units total) by mouth once a week. (Patient taking differently: Take 50,000 Units by mouth every Wednesday.)  ? estradiol (ESTRACE) 2 MG tablet TAKE 1 TABLET(2 MG) BY MOUTH AT BEDTIME  ? fluticasone (FLOVENT HFA) 220 MCG/ACT inhaler Inhale 2 puffs into the lungs 2 (two) times daily. (Patient taking differently: Inhale 2 puffs into the lungs daily as needed (Shortness of  breath).)  ? gabapentin (NEURONTIN) 400 MG capsule TAKE 1 CAPSULE(400 MG) BY MOUTH FOUR TIMES DAILY  ? Insulin Pen Needle (PEN NEEDLES) 32G X 4 MM MISC 32 g by Does not apply route daily.  ? lamoTRIgine (LAMICTAL) 100 MG tablet Take 100 mg by mouth 2 (two) times daily.   ? losartan (COZAAR) 100 MG tablet Take 1 tablet (100 mg total) by mouth daily.  ? metFORMIN (GLUCOPHAGE) 850 MG tablet Take 1 tablet (850 mg total) by mouth 2 (two) times daily with a meal.  ? Multiple Vitamin (MULTIVITAMIN WITH MINERALS) TABS tablet Take 1 tablet by mouth daily.  ? rosuvastatin (CRESTOR) 10 MG tablet Take 1 tablet (10 mg total) by mouth daily. DUE FOR AN APPT IN AUG OR SEPT (Patient taking differently: Take 10 mg by mouth at bedtime. DUE FOR AN APPT IN AUG OR SEPT)  ? Semaglutide,0.25 or 0.'5MG'$ /DOS, (OZEMPIC, 0.25 OR 0.5 MG/DOSE,) 2 MG/1.5ML SOPN Inject 0.5 mg weekly  ? [DISCONTINUED] baclofen (LIORESAL) 10 MG tablet Take 1 tablet (10 mg total) by mouth 3 (three) times daily as needed for muscle spasms.  ? [DISCONTINUED] Ferrous Sulfate (IRON) 28 MG TABS Take 28 mg by mouth daily.  ? [DISCONTINUED] Menthol, Topical Analgesic, (BIOFREEZE COLORLESS) 4 % GEL Apply 1 application topically daily as needed (mucle pain).  ? [DISCONTINUED] Methyl Salicylate-Lido-Menthol 4-4-5 %  PTCH Apply 1 patch topically in the morning and at bedtime.  ? [DISCONTINUED] oxyCODONE (OXY IR/ROXICODONE) 5 MG immediate release tablet Take 1 tablet (5 mg total) by mouth every 4 (four) hours as needed (pain).  ? [DISCONTINUED] venlafaxine (EFFEXOR) 75 MG tablet Take 75 mg by mouth 2 (two) times daily with a meal.   ? ?No facility-administered encounter medications on file as of 05/03/2021.  ? ? ?Allergies (verified) ?Qvar [beclomethasone], Sulfa antibiotics, Tizanidine, Ace inhibitors, Hydrochlorothiazide, Prozac [fluoxetine hcl], Sulfonamide derivatives, Dyazide [hydrochlorothiazide w-triamterene], Erythromycin, Lipitor [atorvastatin calcium], Lisinopril, and  Pravastatin  ? ?History: ?Past Medical History:  ?Diagnosis Date  ? Anemia   ? PT HAS HAD COLONOSCOPY AND ENDOSCOPY WORK UP - NO PROBLEMS FOUND AS SOURCE OF ANEMIA -- PT HAD IRON INFUSION AT [facility] 11/13/12-AFTER SEEING HEMATOLOGIST DR. [place] AND HE GAVE HEMATOLOGIC CLEARANCE FOR GASTRIC BYPASS SURGERY.  ? Anxiety   ? Asthma   ? daily and prn inhalers  ? Degenerative joint disease   ? right knee, spine - STATES INTERMITTENT  NUMBNESS DOWN RT LEG WITH PROLONGED STANDING OR WALKING- THINKS RELATED TO HER SPINE PROBLEMS  ? Dental crowns present   ? all permanent crowns  ? Depression   ? Diabetes mellitus, type 2 ([place])   ? Family history of anesthesia complication   ? pt's mother and sister have hx. of post-op N/V  ? Fibroids   ? UTERINE  ? Gastroesophageal reflux disease   ? none for 2 years since Bariatric surgery.  ? H/O hiatal hernia   ? History of endometriosis   ? History of kidney stones   ? passed stones  ? Hyperlipidemia   ? Hypertension   ? under control with med., has been on med. x 2 yr.  ? Lock jaw   ? jaw locks open if opens mouth wide  ? Migraines   ? Neuropathy   ? bilateral feet  ? Obesity   ? Palpitations   ? no current problems  ? PONV (postoperative nausea and vomiting)   ? Shortness of breath   ? with daily activities, albuterol inhaler  ? Sleep apnea   ? does not use cpap  ? ?Past Surgical History:  ?Procedure Laterality Date  ? ABDOMINAL HYSTERECTOMY N/A 06/06/2017  ? Procedure: HYSTERECTOMY ABDOMINAL;  Surgeon: Florian Buff, MD;  Location: [facility];  Service: Gynecology;  Laterality: N/A;  ? BACK SURGERY    ? BREATH TEK H PYLORI N/A 08/13/2012  ? Procedure: BREATH TEK H PYLORI;  Surgeon: Edward Jolly, MD;  Location: [facility];  Service: General;  Laterality: N/A;  ? CARPAL TUNNEL RELEASE  01/03/2012  ? Procedure: CARPAL TUNNEL RELEASE RIGHT HAND;  Surgeon: Wynonia Sours, MD;  Location: [facility];  Service: Orthopedics;  Laterality: Right;  ? COLONOSCOPY  05/2009  ?  [id number] hemorrhoids otherwise normal colon, rectum and terminal ileum  ? COLONOSCOPY WITH ESOPHAGOGASTRODUODENOSCOPY (EGD) N/A 10/28/2012  ? Procedure: COLONOSCOPY WITH ESOPHAGOGASTRODUODENOSCOPY (EGD);  Surgeon: Daneil Dolin, MD;  [facility];  Service: Endoscopy;  Laterality: N/A;  7:30  ? DILITATION & CURRETTAGE/HYSTROSCOPY WITH NOVASURE ABLATION N/A 07/22/2014  ? Procedure: DILATATION & CURETTAGE/HYSTEROSCOPY WITH NOVASURE ABLATION; uterine length 6.0 cm; uterine width 3.8 cm; total ablation time 1 minute 15 seconds;  Surgeon: Florian Buff, MD;  Location: [facility];  Service: Gynecology;  Laterality: N/A;  ? ESOPHAGOGASTRODUODENOSCOPY  06/04/2009  ? [id number] esophagus/multiple polyps removed s/p (hyperplastic). Gastritis without H.pylori.  ? ESOPHAGOGASTRODUODENOSCOPY (  EGD) WITH PROPOFOL N/A 06/06/2018  ? with LA Grade A esophagitis s/p dilation.   ? GASTRIC ROUX-EN-Y N/A 11/19/2012  ? Procedure: LAPAROSCOPIC ROUX-EN-Y GASTRIC;  Surgeon: Edward Jolly, MD;  Location: [facility];  Service: General;  Laterality: N/A;  ? GIVENS CAPSULE STUDY N/A 10/28/2012  ? Procedure: GIVENS CAPSULE STUDY;  Surgeon: Daneil Dolin, MD;  [facility];  Service: Endoscopy;  Laterality: N/A;  ? KNEE ARTHROSCOPY WITH MEDIAL MENISECTOMY Right 10/28/2015  ? Procedure: RIGHT KNEE ARTHROSCOPY WITH MEDIAL MENISECTOMY;  Surgeon: Carole Civil, MD;  Location: [facility];  Service: Orthopedics;  Laterality: Right;  ? LAPAROSCOPY    ? due to endometriosis  ? LUMBAR LAMINECTOMY/DECOMPRESSION MICRODISCECTOMY N/A 02/14/2020  ? Procedure: LUMBAR LAMINECTOMY  Lumbar three-four, Lumbar four-five;  Surgeon: Consuella Lose, MD;  Location: [facility];  Service: Neurosurgery;  Laterality: N/A;  ? LUMBAR LAMINECTOMY/DECOMPRESSION MICRODISCECTOMY Right 12/21/2020  ? Procedure: Right open Lumbar four-five microdiscectomy;  Surgeon: Judith Part, MD;  Location: [facility];  Service: Neurosurgery;  Laterality: Right;  ?  MALONEY DILATION N/A 06/06/2018  ? Procedure: MALONEY DILATION;  Surgeon: Daneil Dolin, MD;  [facility];  Service: Endoscopy;  Laterality: N/A;  ? PELVIC LAPAROSCOPY  11/04/1999  ? with fulgurat

## 2021-05-03 NOTE — Patient Instructions (Signed)
[name] , ?Thank you for taking time to come for your Medicare Wellness Visit. I appreciate your ongoing commitment to your health goals. Please review the following plan we discussed and let me know if I can assist you in the future.  ? ?Screening recommendations/referrals: ?Colonoscopy: Done 10/28/2012 Repeat in 10 years ? ?Mammogram: Done 08/13/2012 Repeat annually ? ?Bone Density: Due at age 61.  ? ?Recommended yearly ophthalmology/optometry visit for glaucoma screening and checkup ?Recommended yearly dental visit for hygiene and checkup ? ?Vaccinations: ?Influenza vaccine: Done 11/22/2020 Repeat annually ? ?Pneumococcal vaccine: Done 12/11/2013 and 11/22/2020. ?Tdap vaccine: Due. Repeat in 10 years ? ?Shingles vaccine: Discussed.  ?Covid-19: Done 05/27/2019, 04/24/19. ? ?Advanced directives: Advance directive discussed with you today. I have provided a copy for you to complete at home and have notarized. Once this is complete please bring a copy in to our office so we can scan it into your chart. ? ? ?Conditions/risks identified: Aim for 30 minutes of exercise or brisk walking, 6-8 glasses of water, and 5 servings of fruits and vegetables each day. ? ? ?Next appointment: Follow up in one year for your annual wellness visit. 2024. ? ?Preventive Care 40-64 Years, Female ?Preventive care refers to lifestyle choices and visits with your health care provider that can promote health and wellness. ?What does preventive care include? ?A yearly physical exam. This is also called an annual well check. ?Dental exams once or twice a year. ?Routine eye exams. Ask your health care provider how often you should have your eyes checked. ?Personal lifestyle choices, including: ?Daily care of your teeth and gums. ?Regular physical activity. ?Eating a healthy diet. ?Avoiding tobacco and drug use. ?Limiting alcohol use. ?Practicing safe sex. ?Taking low-dose aspirin daily starting at age 30. ?Taking vitamin and mineral supplements  as recommended by your health care provider. ?What happens during an annual well check? ?The services and screenings done by your health care provider during your annual well check will depend on your age, overall health, lifestyle risk factors, and family history of disease. ?Counseling  ?Your health care provider may ask you questions about your: ?Alcohol use. ?Tobacco use. ?Drug use. ?Emotional well-being. ?Home and relationship well-being. ?Sexual activity. ?Eating habits. ?Work and work Statistician. ?Method of birth control. ?Menstrual cycle. ?Pregnancy history. ?Screening  ?You may have the following tests or measurements: ?Height, weight, and BMI. ?Blood pressure. ?Lipid and cholesterol levels. These may be checked every 5 years, or more frequently if you are over 74 years old. ?Skin check. ?Lung cancer screening. You may have this screening every year starting at age 9 if you have a 30-pack-year history of smoking and currently smoke or have quit within the past 15 years. ?Fecal occult blood test (FOBT) of the stool. You may have this test every year starting at age 69. ?Flexible sigmoidoscopy or colonoscopy. You may have a sigmoidoscopy every 5 years or a colonoscopy every 10 years starting at age 35. ?Hepatitis C blood test. ?Hepatitis B blood test. ?Sexually transmitted disease (STD) testing. ?Diabetes screening. This is done by checking your blood sugar (glucose) after you have not eaten for a while (fasting). You may have this done every 1-3 years. ?Mammogram. This may be done every 1-2 years. Talk to your health care provider about when you should start having regular mammograms. This may depend on whether you have a family history of breast cancer. ?BRCA-related cancer screening. This may be done if you have a family history of breast, ovarian, tubal, or  peritoneal cancers. ?Pelvic exam and Pap test. This may be done every 3 years starting at age 67. Starting at age 91, this may be done every 5 years  if you have a Pap test in combination with an HPV test. ?Bone density scan. This is done to screen for osteoporosis. You may have this scan if you are at high risk for osteoporosis. ?Discuss your test results, treatment options, and if necessary, the need for more tests with your health care provider. ?Vaccines  ?Your health care provider may recommend certain vaccines, such as: ?Influenza vaccine. This is recommended every year. ?Tetanus, diphtheria, and acellular pertussis (Tdap, Td) vaccine. You may need a Td booster every 10 years. ?Zoster vaccine. You may need this after age 53. ?Pneumococcal 13-valent conjugate (PCV13) vaccine. You may need this if you have certain conditions and were not previously vaccinated. ?Pneumococcal polysaccharide (PPSV23) vaccine. You may need one or two doses if you smoke cigarettes or if you have certain conditions. ?Talk to your health care provider about which screenings and vaccines you need and how often you need them. ?This information is not intended to replace advice given to you by your health care provider. Make sure you discuss any questions you have with your health care provider. ?Document Released: 02/05/2015 Document Revised: 09/29/2015 Document Reviewed: 11/10/2014 ?Elsevier Interactive Patient Education ? 2017 Elsevier Inc. ? ? ? ?Fall Prevention in the Home ?Falls can cause injuries. They can happen to people of all ages. There are many things you can do to make your home safe and to help prevent falls. ?What can I do on the outside of my home? ?Regularly fix the edges of walkways and driveways and fix any cracks. ?Remove anything that might make you trip as you walk through a door, such as a raised step or threshold. ?Trim any bushes or trees on the path to your home. ?Use bright outdoor lighting. ?Clear any walking paths of anything that might make someone trip, such as rocks or tools. ?Regularly check to see if handrails are loose or broken. Make sure that both  sides of any steps have handrails. ?Any raised decks and porches should have guardrails on the edges. ?Have any leaves, snow, or ice cleared regularly. ?Use sand or salt on walking paths during winter. ?Clean up any spills in your garage right away. This includes oil or grease spills. ?What can I do in the bathroom? ?Use night lights. ?Install grab bars by the toilet and in the tub and shower. Do not use towel bars as grab bars. ?Use non-skid mats or decals in the tub or shower. ?If you need to sit down in the shower, use a plastic, non-slip stool. ?Keep the floor dry. Clean up any water that spills on the floor as soon as it happens. ?Remove soap buildup in the tub or shower regularly. ?Attach bath mats securely with double-sided non-slip rug tape. ?Do not have throw rugs and other things on the floor that can make you trip. ?What can I do in the bedroom? ?Use night lights. ?Make sure that you have a light by your bed that is easy to reach. ?Do not use any sheets or blankets that are too big for your bed. They should not hang down onto the floor. ?Have a firm chair that has side arms. You can use this for support while you get dressed. ?Do not have throw rugs and other things on the floor that can make you trip. ?What can I do in  the kitchen? ?Clean up any spills right away. ?Avoid walking on wet floors. ?Keep items that you use a lot in easy-to-reach places. ?If you need to reach something above you, use a strong step stool that has a grab bar. ?Keep electrical cords out of the way. ?Do not use floor polish or wax that makes floors slippery. If you must use wax, use non-skid floor wax. ?Do not have throw rugs and other things on the floor that can make you trip. ?What can I do with my stairs? ?Do not leave any items on the stairs. ?Make sure that there are handrails on both sides of the stairs and use them. Fix handrails that are broken or loose. Make sure that handrails are as long as the stairways. ?Check any  carpeting to make sure that it is firmly attached to the stairs. Fix any carpet that is loose or worn. ?Avoid having throw rugs at the top or bottom of the stairs. If you do have throw rugs, attach them

## 2021-05-13 ENCOUNTER — Telehealth: Payer: Self-pay | Admitting: *Deleted

## 2021-05-13 ENCOUNTER — Telehealth: Payer: Self-pay | Admitting: Pharmacist

## 2021-05-13 DIAGNOSIS — Z794 Long term (current) use of insulin: Secondary | ICD-10-CM | POA: Diagnosis not present

## 2021-05-13 DIAGNOSIS — E785 Hyperlipidemia, unspecified: Secondary | ICD-10-CM | POA: Diagnosis not present

## 2021-05-13 DIAGNOSIS — D509 Iron deficiency anemia, unspecified: Secondary | ICD-10-CM | POA: Diagnosis not present

## 2021-05-13 DIAGNOSIS — I10 Essential (primary) hypertension: Secondary | ICD-10-CM | POA: Diagnosis not present

## 2021-05-13 DIAGNOSIS — E119 Type 2 diabetes mellitus without complications: Secondary | ICD-10-CM | POA: Diagnosis not present

## 2021-05-13 DIAGNOSIS — E1169 Type 2 diabetes mellitus with other specified complication: Secondary | ICD-10-CM | POA: Diagnosis not present

## 2021-05-13 NOTE — Chronic Care Management (AMB) (Signed)
?  Care Management  ? ?Note ? ?05/13/2021 ?Name: RIDHIMA GOLBERG MRN: [id number] DOB: 1960/07/20 ? ?NYSIA DELL is a 61 y.o. year old female who is a primary care patient of Luking, Elayne Snare, MD. I reached out to Idamae Schuller by phone today offer care coordination services.  ? ?Ms. Czarnecki was given information about care management services today including:  ?Care management services include personalized support from designated clinical staff supervised by her physician, including individualized plan of care and coordination with other care providers ?24/7 contact phone numbers for assistance for urgent and routine care needs. ?The patient may stop care management services at any time by phone call to the office staff. ? ?Patient agreed to services and verbal consent obtained.  ? ?Follow up plan: ?Telephone appointment with care management team member scheduled for:06/15/21 ? ?Laverda Sorenson  ?Care Guide, Embedded Care Coordination ?[facility]  Care Management  ?Direct Dial: [phone] ? ?

## 2021-05-13 NOTE — Progress Notes (Signed)
[facility])  ?[facility] Quality Pharmacy Team  ? ? ?05/13/2021 ? ?Carrie Cook ?06/20/1960 ?[id number] ? ?Reason for referral: Medication Assistance ? ?Referral source: [facility] RN ?Current insurance: [employer]  ? ?Outreach:  ?Successful telephone call with patient's husband.  HIPAA identifiers verified. Patient unavailable at this time. Patient's husband took my phone number and name and will have patient call me back.  ? ?Objective: ?The 10-year ASCVD risk score (Arnett DK, et al., 2019) is: 9.9% ?  Values used to calculate the score: ?    Age: 61 years ?    Sex: Female ?    Is Non-Hispanic African American: No ?    Diabetic: Yes ?    Tobacco smoker: No ?    Systolic Blood Pressure: 086 mmHg ?    Is BP treated: Yes ?    HDL Cholesterol: 45 mg/dL ?    Total Cholesterol: 145 mg/dL ? ?Lab Results  ?Component Value Date  ? CREATININE 0.79 02/18/2021  ? CREATININE 1.10 (H) 12/10/2020  ? CREATININE 0.71 11/18/2020  ? ? ?Lab Results  ?Component Value Date  ? HGBA1C 6.6 (H) 02/18/2021  ? ? ?Lipid Panel  ?   ?Component Value Date/Time  ? CHOL 145 11/18/2020 0830  ? TRIG 192 (H) 11/18/2020 0830  ? HDL 45 11/18/2020 0830  ? CHOLHDL 3.2 11/18/2020 0830  ? CHOLHDL 5.6 05/01/2013 0759  ? VLDL 35 05/01/2013 0759  ? [place] 68 11/18/2020 0830  ? ? ?BP Readings from Last 3 Encounters:  ?03/03/21 134/88  ?12/22/20 109/66  ?12/10/20 (!) 163/87  ? ? ?Allergies  ?Allergen Reactions  ? Qvar [Beclomethasone] Shortness Of Breath and Other (See Comments)  ?  Patient states that this medication causes her to feel short of breath  ? Sulfa Antibiotics Hives and Rash  ? Tizanidine Shortness Of Breath, Itching and Rash  ? Ace Inhibitors Cough  ? Hydrochlorothiazide Cough  ? Prozac [Fluoxetine Hcl] Hives  ? Sulfonamide Derivatives Hives and Rash  ? Dyazide [Hydrochlorothiazide W-Triamterene] Cough  ? Erythromycin Hives and Other (See Comments)  ?  nausea  ? Lipitor [Atorvastatin Calcium] Other (See Comments)  ?  Body aches, flu-like  symptoms  ? Lisinopril Cough  ? Pravastatin Other (See Comments)  ?  BODY ACHES, FLU-LIKE SX.  ? ? ?Loretha Brasil, PharmD ?[facility] ?Clinical Pharmacist ?Office: [phone] ? ?  ?

## 2021-05-15 LAB — CBC WITH DIFFERENTIAL/PLATELET
Basophils Absolute: 0 10*3/uL (ref 0.0–0.2)
Basos: 1 %
EOS (ABSOLUTE): 0.1 10*3/uL (ref 0.0–0.4)
Eos: 2 %
Hematocrit: 47 % — ABNORMAL HIGH (ref 34.0–46.6)
Hemoglobin: 15.9 g/dL (ref 11.1–15.9)
Immature Grans (Abs): 0 10*3/uL (ref 0.0–0.1)
Immature Granulocytes: 0 %
Lymphocytes Absolute: 2.3 10*3/uL (ref 0.7–3.1)
Lymphs: 30 %
MCH: 30 pg (ref 26.6–33.0)
MCHC: 33.8 g/dL (ref 31.5–35.7)
MCV: 89 fL (ref 79–97)
Monocytes Absolute: 0.7 10*3/uL (ref 0.1–0.9)
Monocytes: 9 %
Neutrophils Absolute: 4.6 10*3/uL (ref 1.4–7.0)
Neutrophils: 58 %
Platelets: 292 10*3/uL (ref 150–450)
RBC: 5.3 x10E6/uL — ABNORMAL HIGH (ref 3.77–5.28)
RDW: 12.1 % (ref 11.7–15.4)
WBC: 7.8 10*3/uL (ref 3.4–10.8)

## 2021-05-15 LAB — IRON AND TIBC
Iron Saturation: 48 % (ref 15–55)
Iron: 145 ug/dL (ref 27–159)
Total Iron Binding Capacity: 304 ug/dL (ref 250–450)
UIBC: 159 ug/dL (ref 131–425)

## 2021-05-15 LAB — MICROALBUMIN / CREATININE URINE RATIO
Creatinine, Urine: 212.4 mg/dL
Microalb/Creat Ratio: 8 mg/g creat (ref 0–29)
Microalbumin, Urine: 16.8 ug/mL

## 2021-05-15 LAB — LIPID PANEL
Chol/HDL Ratio: 3.2 ratio (ref 0.0–4.4)
Cholesterol, Total: 161 mg/dL (ref 100–199)
HDL: 50 mg/dL (ref >39)
LDL Chol Calc (NIH): 81 mg/dL (ref 0–99)
Triglycerides: 177 mg/dL — ABNORMAL HIGH (ref 0–149)
VLDL Cholesterol Cal: 30 mg/dL (ref 5–40)

## 2021-05-15 LAB — HEPATIC FUNCTION PANEL
ALT: 23 IU/L (ref 0–32)
AST: 22 IU/L (ref 0–40)
Albumin: 4.6 g/dL (ref 3.8–4.9)
Alkaline Phosphatase: 107 IU/L (ref 44–121)
Bilirubin Total: 0.3 mg/dL (ref 0.0–1.2)
Bilirubin, Direct: 0.11 mg/dL (ref 0.00–0.40)
Total Protein: 6.6 g/dL (ref 6.0–8.5)

## 2021-05-15 LAB — BASIC METABOLIC PANEL
BUN/Creatinine Ratio: 14 (ref 9–23)
BUN: 10 mg/dL (ref 6–24)
CO2: 22 mmol/L (ref 20–29)
Calcium: 9.9 mg/dL (ref 8.7–10.2)
Chloride: 102 mmol/L (ref 96–106)
Creatinine, Ser: 0.71 mg/dL (ref 0.57–1.00)
Glucose: 124 mg/dL — ABNORMAL HIGH (ref 70–99)
Potassium: 4.9 mmol/L (ref 3.5–5.2)
Sodium: 140 mmol/L (ref 134–144)
eGFR: 98 mL/min/{1.73_m2} (ref >59)

## 2021-05-15 LAB — HEMOGLOBIN A1C
Est. average glucose Bld gHb Est-mCnc: 128 mg/dL
Hgb A1c MFr Bld: 6.1 % — ABNORMAL HIGH (ref 4.8–5.6)

## 2021-05-15 LAB — FERRITIN: Ferritin: 30 ng/mL (ref 15–150)

## 2021-05-16 ENCOUNTER — Other Ambulatory Visit: Payer: Self-pay | Admitting: *Deleted

## 2021-05-16 MED ORDER — ROSUVASTATIN CALCIUM 20 MG PO TABS: ORAL_TABLET | ORAL | 1 refills | Status: DC

## 2021-05-16 MED ORDER — AMLODIPINE BESYLATE 2.5 MG PO TABS: 2.5000 mg | ORAL_TABLET | Freq: Every day | ORAL | 0 refills | Status: DC

## 2021-05-16 NOTE — Progress Notes (Signed)
[facility])  ?[facility] Quality Pharmacy Team  ? ? ?05/16/2021 ? ?Idamae Schuller ?Dec 06, 1960 ?[id number] ? ?Reason for referral: Medication Assistance ? ?Referral source: [facility] RN ?Current insurance:  [employer]  ? ? ?Outreach:  ?Successful telephone call with spouse.  HIPAA identifiers verified. Mr. Xie stated that patient was not available. Stated that he had written down my name and number and that,  "he guesses patient will contact me when she is in need of medication assistance".  ? ? ?Plan: ?Unsuccessful outreach to patient, spoke with spouse x 2, left my direct call back number and name for patient to reach out to me. Spouse states he, "guesses she will call when she needs assistance with medications".  ?Will close [place] case as no further medication needs identified at this time.  Am happy to assist in the future as needed.   ? ?Loretha Brasil, PharmD ?[facility] ?Clinical Pharmacist ?Office: [phone] ? ?  ?

## 2021-05-17 ENCOUNTER — Other Ambulatory Visit: Payer: Self-pay | Admitting: *Deleted

## 2021-05-17 MED ORDER — METFORMIN HCL 850 MG PO TABS: 850.0000 mg | ORAL_TABLET | Freq: Every day | ORAL | 0 refills | Status: DC

## 2021-05-22 ENCOUNTER — Other Ambulatory Visit: Payer: Self-pay | Admitting: Family Medicine

## 2021-06-15 ENCOUNTER — Ambulatory Visit: Payer: Medicare Other | Admitting: *Deleted

## 2021-06-15 DIAGNOSIS — E119 Type 2 diabetes mellitus without complications: Secondary | ICD-10-CM

## 2021-06-15 DIAGNOSIS — I10 Essential (primary) hypertension: Secondary | ICD-10-CM

## 2021-06-15 NOTE — Chronic Care Management (AMB) (Signed)
Care Management    RN Visit Note  06/15/2021 Name: JUDETH GILLES MRN: [id number] DOB: 25-Dec-1960  Subjective: CABRINI RUGGIERI is a 61 y.o. year old female who is a primary care patient of Luking, Elayne Snare, MD. The care management team was consulted for assistance with disease management and care coordination needs.    Engaged with patient by telephone for initial visit in response to provider referral for case management and/or care coordination services.   Consent to Services:   Ms. Delude was given information about Care Management services today including:  Care Management services includes personalized support from designated clinical staff supervised by her physician, including individualized plan of care and coordination with other care providers 24/7 contact phone numbers for assistance for urgent and routine care needs. The patient may stop case management services at any time by phone call to the office staff.  Patient agreed to services and consent obtained.   Assessment: Review of patient past medical history, allergies, medications, health status, including review of consultants reports, laboratory and other test data, was performed as part of comprehensive evaluation and provision of chronic care management services.   SDOH (Social Determinants of Health) assessments and interventions performed:  SDOH Interventions    Flowsheet Row Most Recent Value  SDOH Interventions   Food Insecurity Interventions Intervention Not Indicated  Housing Interventions Intervention Not Indicated  Transportation Interventions Intervention Not Indicated  Depression Interventions/Treatment  Counseling  [pt sees mental health professional]        Care Plan  Allergies  Allergen Reactions   Qvar [Beclomethasone] Shortness Of Breath and Other (See Comments)    Patient states that this medication causes her to feel short of breath   Sulfa Antibiotics Hives and Rash   Tizanidine  Shortness Of Breath, Itching and Rash   Ace Inhibitors Cough   Hydrochlorothiazide Cough   Prozac [Fluoxetine Hcl] Hives   Sulfonamide Derivatives Hives and Rash   Dyazide [Hydrochlorothiazide W-Triamterene] Cough   Erythromycin Hives and Other (See Comments)    nausea   Lipitor [Atorvastatin Calcium] Other (See Comments)    Body aches, flu-like symptoms   Lisinopril Cough   Pravastatin Other (See Comments)    BODY ACHES, FLU-LIKE SX.    Outpatient Encounter Medications as of 06/15/2021  Medication Sig   albuterol (VENTOLIN HFA) 108 (90 Base) MCG/ACT inhaler Inhale 2 puffs into the lungs every 6 (six) hours as needed for wheezing.   amLODipine (NORVASC) 2.5 MG tablet Take 1 tablet (2.5 mg total) by mouth daily.   cetirizine (ZYRTEC) 10 MG tablet Take 10 mg by mouth daily.   desvenlafaxine (PRISTIQ) 100 MG 24 hr tablet Take by mouth.   ergocalciferol (VITAMIN D2) 1.25 MG (50000 UT) capsule Take 1 capsule (50,000 Units total) by mouth once a week. (Patient taking differently: Take 50,000 Units by mouth every Wednesday.)   estradiol (ESTRACE) 2 MG tablet TAKE 1 TABLET(2 MG) BY MOUTH AT BEDTIME   fluticasone (FLOVENT HFA) 220 MCG/ACT inhaler Inhale 2 puffs into the lungs 2 (two) times daily. (Patient taking differently: Inhale 2 puffs into the lungs daily as needed (Shortness of breath).)   gabapentin (NEURONTIN) 400 MG capsule TAKE 1 CAPSULE(400 MG) BY MOUTH FOUR TIMES DAILY   Insulin Pen Needle (PEN NEEDLES) 32G X 4 MM MISC 32 g by Does not apply route daily.   lamoTRIgine (LAMICTAL) 100 MG tablet Take 100 mg by mouth 2 (two) times daily.    losartan (COZAAR) 100 MG tablet Take  1 tablet (100 mg total) by mouth daily.   metFORMIN (GLUCOPHAGE) 850 MG tablet Take 1 tablet (850 mg total) by mouth daily with breakfast.   Multiple Vitamin (MULTIVITAMIN WITH MINERALS) TABS tablet Take 1 tablet by mouth daily.   OZEMPIC, 0.25 OR 0.5 MG/DOSE, 2 MG/3ML SOPN INJECT 0.5MG UNDER THE SKIN ONCE WEEKLY    rosuvastatin (CRESTOR) 20 MG tablet Take 1 tablet by mouth once daily   amoxicillin (AMOXIL) 500 MG capsule Take 2,000 mg by mouth See admin instructions. Take 4 capsules (2000 mg) by mouth 1 hour prior to dental appointment (Patient not taking: Reported on 06/15/2021)   Semaglutide,0.25 or 0.5MG/DOS, (OZEMPIC, 0.25 OR 0.5 MG/DOSE,) 2 MG/1.5ML SOPN Inject 0.5 mg weekly   No facility-administered encounter medications on file as of 06/15/2021.    Patient Active Problem List   Diagnosis Date Noted   Status post lumbar spine surgery for decompression of spinal cord 04/12/2020   Abnormality of gait 04/12/2020   Neuropathic pain 04/12/2020   Muscle spasm    Essential hypertension    Drug induced constipation    Postoperative pain    Lumbar disc herniation with myelopathy 02/24/2020   Left foot drop 02/14/2020   Lumbar spinal stenosis 02/14/2020   Lumbar radiculopathy 02/13/2020   Acute right-sided back pain with sciatica 10/02/2019   S/P total knee replacement, right 07/22/19  09/01/2019   Primary localized osteoarthritis of left knee 07/22/2019   Primary osteoarthritis of right knee    Constipation 09/05/2018   Abdominal pain, chronic, epigastric 05/27/2018   Dysphagia 05/27/2018   S/P complete hysterectomy 06/06/2017   Derangement of posterior horn of medial meniscus of right knee    Right knee pain 09/10/2015   Iron deficiency anemia 10/16/2013   Fibroids 08/21/2013   Excessive or frequent menstruation 06/19/2013   Obstructive sleep apnea 06/21/2012   Chest pain    Hyperlipidemia associated with type 2 diabetes mellitus ([place])    Type 2 diabetes mellitus ([place])    Obesity    Laboratory test    Nephrolithiasis    Asthma    Endometriosis    Hypertension    Iron deficiency anemia 05/11/2009   GERD 05/11/2009    Conditions to be addressed/monitored: HTN and DMII  Care Plan : RN Care Manager Plan of Care  Updates made by Kassie Mends, RN since 06/15/2021 12:00 AM      Problem: No plan of care established for management of chronic disease state  (DM2, HTN)   Priority: High     Long-Range Goal: Development of plan of care for chronic disease management  (DM2, HTN)   Start Date: 06/15/2021  Expected End Date: 12/12/2021  Priority: High  Note:   Current Barriers:  Knowledge Deficits related to plan of care for management of HTN and DMII  Chronic Disease Management support and education needs related to HTN and DMII Patient reports she lives with spouse and is overall independent with ADL, IADL's, has had at least 2 falls in the past year, has had back surgeries which has also hindered her ability to exercise, has depression PHQ9=7 and sees mental health professional and is on medication, does not have advanced directives and has information to complete.   Patient reports she does not check CBG although her doctor has ask her to do so, pt used to check CBG in the past and states she will try her best to get started again. Patient does not check blood pressure, states her cuff is broken  and she will obtain a new one.  RNCM Clinical Goal(s):  Patient will verbalize understanding of plan for management of HTN and DMII as evidenced by patient report, review of EHR and  through collaboration with RN Care manager, provider, and care team.   Interventions: 1:1 collaboration with primary care provider regarding development and update of comprehensive plan of care as evidenced by provider attestation and co-signature Inter-disciplinary care team collaboration (see longitudinal plan of care) Evaluation of current treatment plan related to  self management and patient's adherence to plan as established by provider   Diabetes Interventions:  (Status:  New goal. and Goal on track:  Yes.) Long Term Goal Assessed patient's understanding of A1c goal: <7% Provided education to patient about basic DM disease process Reviewed medications with patient and discussed  importance of medication adherence Discussed plans with patient for ongoing care management follow up and provided patient with direct contact information for care management team Provided patient with written educational materials related to hypo and hyperglycemia and importance of correct treatment Review of patient status, including review of consultants reports, relevant laboratory and other test results, and medications completed Screening for signs and symptoms of depression related to chronic disease state  Assessed social determinant of health barriers Education sent via My Chart- hypoglycemia Lab Results  Component Value Date   HGBA1C 6.1 (H) 05/13/2021   Hypertension Interventions:  (Status:  New goal. and Goal on track:  Yes.) Long Term Goal Last practice recorded BP readings:  BP Readings from Last 3 Encounters:  03/03/21 134/88  12/22/20 109/66  12/10/20 (!) 163/87  Most recent eGFR/CrCl:  Lab Results  Component Value Date   EGFR 98 05/13/2021    No components found for: CRCL  Evaluation of current treatment plan related to hypertension self management and patient's adherence to plan as established by provider Reviewed medications with patient and discussed importance of compliance Counseled on the importance of exercise goals with target of 150 minutes per week Discussed plans with patient for ongoing care management follow up and provided patient with direct contact information for care management team Discussed complications of poorly controlled blood pressure such as heart disease, stroke, circulatory complications, vision complications, kidney impairment, sexual dysfunction Screening for signs and symptoms of depression related to chronic disease state  Assessed social determinant of health barriers Ask patient to obtain new blood pressure cuff Education provided via My Chart- low sodium diet  Reviewed safety precautions  Patient Goals/Self-Care Activities: Take  medications as prescribed   Attend all scheduled provider appointments Call pharmacy for medication refills 3-7 days in advance of running out of medications Attend church or other social activities Perform all self care activities independently  Perform IADL's (shopping, preparing meals, housekeeping, managing finances) independently Call provider office for new concerns or questions  check blood sugar at prescribed times: once daily check feet daily for cuts, sores or redness enter blood sugar readings and medication or insulin into daily log take the blood sugar log to all doctor visits take the blood sugar meter to all doctor visits fill half of plate with vegetables prepare main meal at home 3 to 5 days each week read food labels for fat, fiber, carbohydrates and portion size check blood pressure 3 times per week choose a place to take my blood pressure (home, clinic or office, retail store) keep all doctor appointments take medications for blood pressure exactly as prescribed begin an exercise program eat more whole grains, fruits and vegetables, lean meats  and healthy fats Please purchase a new blood pressure cuff Look over education via My Chart- low sodium diet and hypoglycemia fall prevention strategies: change position slowly, use assistive device such as walker or cane (per provider recommendations) when walking, keep walkways clear, have good lighting in room. It is important to contact your provider if you have any falls, maintain muscle strength/tone by exercise per provider recommendations.       Plan: Telephone follow up appointment with care management team member scheduled for:  08/24/21  Jacqlyn Larsen [facility], BSN RN Case Manager [place] [phone]

## 2021-06-15 NOTE — Patient Instructions (Signed)
Visit Information  Thank you for taking time to visit with me today. Please don't hesitate to contact me if I can be of assistance to you before our next scheduled telephone appointment.  Following are the goals we discussed today:  Take medications as prescribed   Attend all scheduled provider appointments Call pharmacy for medication refills 3-7 days in advance of running out of medications Attend church or other social activities Perform all self care activities independently  Perform IADL's (shopping, preparing meals, housekeeping, managing finances) independently Call provider office for new concerns or questions  check blood sugar at prescribed times: once daily check feet daily for cuts, sores or redness enter blood sugar readings and medication or insulin into daily log take the blood sugar log to all doctor visits take the blood sugar meter to all doctor visits fill half of plate with vegetables prepare main meal at home 3 to 5 days each week read food labels for fat, fiber, carbohydrates and portion size check blood pressure 3 times per week choose a place to take my blood pressure (home, clinic or office, retail store) keep all doctor appointments take medications for blood pressure exactly as prescribed begin an exercise program eat more whole grains, fruits and vegetables, lean meats and healthy fats Please purchase a new blood pressure cuff Look over education via My Chart- low sodium diet and hypoglycemia fall prevention strategies: change position slowly, use assistive device such as walker or cane (per provider recommendations) when walking, keep walkways clear, have good lighting in room. It is important to contact your provider if you have any falls, maintain muscle strength/tone by exercise per provider recommendations.  Our next appointment is by telephone on 08/24/21 at 1045 am  Please call the care guide team at [phone] if you need to cancel or reschedule  your appointment.   If you are experiencing a Mental Health or [place] or need someone to talk to, please call the Suicide and Crisis Lifeline: 988 call the [place] National Suicide Prevention Lifeline: (281)439-5951 or TTY: 986-257-1406 TTY 785 883 3951) to talk to a trained counselor call 1-800-273-TALK (toll free, 24 hour hotline) go to [facility] [address] [place] [phone]) call the [place]: [phone] call 911   Following is a copy of your full plan of care:  Care Plan : [place] of Care  Updates made by Kassie Mends, RN since 06/15/2021 12:00 AM     Problem: No plan of care established for management of chronic disease state  (DM2, HTN)   Priority: High     Long-Range Goal: Development of plan of care for chronic disease management  (DM2, HTN)   Start Date: 06/15/2021  Expected End Date: 12/12/2021  Priority: High  Note:   Current Barriers:  Knowledge Deficits related to plan of care for management of HTN and DMII  Chronic Disease Management support and education needs related to HTN and DMII Patient reports she lives with spouse and is overall independent with ADL, IADL's, has had at least 2 falls in the past year, has had back surgeries which has also hindered her ability to exercise, has depression PHQ9=7 and sees mental health professional and is on medication, does not have advanced directives and has information to complete.   Patient reports she does not check CBG although her doctor has ask her to do so, pt used to check CBG in the past and states she will try her  best to get started again. Patient does not check blood pressure, states her cuff is broken and she will obtain a new one.  RNCM Clinical Goal(s):  Patient will verbalize understanding of plan for management of HTN and DMII as evidenced by patient report, review of EHR and  through collaboration with RN Care  manager, provider, and care team.   Interventions: 1:1 collaboration with primary care provider regarding development and update of comprehensive plan of care as evidenced by provider attestation and co-signature Inter-disciplinary care team collaboration (see longitudinal plan of care) Evaluation of current treatment plan related to  self management and patient's adherence to plan as established by provider   Diabetes Interventions:  (Status:  New goal. and Goal on track:  Yes.) Long Term Goal Assessed patient's understanding of A1c goal: <7% Provided education to patient about basic DM disease process Reviewed medications with patient and discussed importance of medication adherence Discussed plans with patient for ongoing care management follow up and provided patient with direct contact information for care management team Provided patient with written educational materials related to hypo and hyperglycemia and importance of correct treatment Review of patient status, including review of consultants reports, relevant laboratory and other test results, and medications completed Screening for signs and symptoms of depression related to chronic disease state  Assessed social determinant of health barriers Education sent via My Chart- hypoglycemia Lab Results  Component Value Date   HGBA1C 6.1 (H) 05/13/2021   Hypertension Interventions:  (Status:  New goal. and Goal on track:  Yes.) Long Term Goal Last practice recorded BP readings:  BP Readings from Last 3 Encounters:  03/03/21 134/88  12/22/20 109/66  12/10/20 (!) 163/87  Most recent eGFR/CrCl:  Lab Results  Component Value Date   EGFR 98 05/13/2021    No components found for: CRCL  Evaluation of current treatment plan related to hypertension self management and patient's adherence to plan as established by provider Reviewed medications with patient and discussed importance of compliance Counseled on the importance of exercise  goals with target of 150 minutes per week Discussed plans with patient for ongoing care management follow up and provided patient with direct contact information for care management team Discussed complications of poorly controlled blood pressure such as heart disease, stroke, circulatory complications, vision complications, kidney impairment, sexual dysfunction Screening for signs and symptoms of depression related to chronic disease state  Assessed social determinant of health barriers Ask patient to obtain new blood pressure cuff Education provided via My Chart- low sodium diet  Reviewed safety precautions  Patient Goals/Self-Care Activities: Take medications as prescribed   Attend all scheduled provider appointments Call pharmacy for medication refills 3-7 days in advance of running out of medications Attend church or other social activities Perform all self care activities independently  Perform IADL's (shopping, preparing meals, housekeeping, managing finances) independently Call provider office for new concerns or questions  check blood sugar at prescribed times: once daily check feet daily for cuts, sores or redness enter blood sugar readings and medication or insulin into daily log take the blood sugar log to all doctor visits take the blood sugar meter to all doctor visits fill half of plate with vegetables prepare main meal at home 3 to 5 days each week read food labels for fat, fiber, carbohydrates and portion size check blood pressure 3 times per week choose a place to take my blood pressure (home, clinic or office, retail store) keep all doctor appointments take medications for blood pressure  exactly as prescribed begin an exercise program eat more whole grains, fruits and vegetables, lean meats and healthy fats Please purchase a new blood pressure cuff Look over education via My Chart- low sodium diet and hypoglycemia fall prevention strategies: change position slowly,  use assistive device such as walker or cane (per provider recommendations) when walking, keep walkways clear, have good lighting in room. It is important to contact your provider if you have any falls, maintain muscle strength/tone by exercise per provider recommendations.       Carrie Cook was given information about Care Management services by the embedded care coordination team including:  Care Management services include personalized support from designated clinical staff supervised by her physician, including individualized plan of care and coordination with other care providers 24/7 contact phone numbers for assistance for urgent and routine care needs. The patient may stop CCM services at any time (effective at the end of the month) by phone call to the office staff.  Patient agreed to services and verbal consent obtained.   Patient verbalizes understanding of instructions and care plan provided today and agrees to view in [place]. Active MyChart status and patient understanding of how to access instructions and care plan via MyChart confirmed with patient.     Telephone follow up appointment with care management team member scheduled for: 08/24/21  Hypoglycemia Hypoglycemia occurs when the level of sugar (glucose) in the blood is too low. Hypoglycemia can happen in people who have or do not have diabetes. It can develop quickly, and it can be a medical emergency. For most people, a blood glucose level below 70 mg/dL (3.9 mmol/L) is considered hypoglycemia. Glucose is a type of sugar that provides the body's main source of energy. Certain hormones (insulin and glucagon) control the level of glucose in the blood. Insulin lowers blood glucose, and glucagon raises blood glucose. Hypoglycemia can result from having too much insulin in the bloodstream, or from not eating enough food that contains glucose. You may also have reactive hypoglycemia, which happens within 4 hours after eating a  meal. What are the causes? Hypoglycemia occurs most often in people who have diabetes and may be caused by: Diabetes medicine. Not eating enough, or not eating often enough. Increased physical activity. Drinking alcohol on an empty stomach. If you do not have diabetes, hypoglycemia may be caused by: A tumor in the pancreas. Not eating enough, or not eating for long periods at a time (fasting). A severe infection or illness. Problems after having bariatric surgery. Organ failure, such as kidney or liver failure. Certain medicines. What increases the risk? Hypoglycemia is more likely to develop in people who: Have diabetes and take medicines to lower blood glucose. Abuse alcohol. Have a severe illness. What are the signs or symptoms? Symptoms vary depending on whether the condition is mild, moderate, or severe. Mild hypoglycemia Hunger. Sweating and feeling clammy. Dizziness or feeling light-headed. Sleepiness or restless sleep. Nausea. Increased heart rate. Headache. Blurry vision. Mood changes, such as irritability or anxiety. Tingling or numbness around the mouth, lips, or tongue. Moderate hypoglycemia Confusion and poor judgment. Behavior changes. Weakness. Irregular heartbeat. A change in coordination. Severe hypoglycemia Severe hypoglycemia is a medical emergency. It can cause: Fainting. Seizures. Loss of consciousness (coma). Death. How is this diagnosed? Hypoglycemia is diagnosed with a blood test to measure your blood glucose level. This blood test is done while you are having symptoms. Your health care provider may also do a physical exam and review your medical  history. How is this treated? This condition can be treated by immediately eating or drinking something that contains sugar with 15 grams of fast-acting carbohydrate, such as: 4 oz (120 mL) of fruit juice. 4 oz (120 mL) of regular soda (not diet soda). Several pieces of hard candy. Check food labels  to find out how many pieces to eat for 15 grams. 1 Tbsp (15 mL) of sugar or honey. 4 glucose tablets. 1 tube of glucose gel. Treating hypoglycemia if you have diabetes If you are alert and able to swallow safely, follow the 15:15 rule: Take 15 grams of a fast-acting carbohydrate. Talk with your health care provider about how much you should take. Options for getting 15 grams of fast-acting carbohydrate include: Glucose tablets (take 4 tablets). Several pieces of hard candy. Check food labels to find out how many pieces to eat for 15 grams. 4 oz (120 mL) of fruit juice. 4 oz (120 mL) of regular soda (not diet soda). 1 Tbsp (15 mL) of sugar or honey. 1 tube of glucose gel. Check your blood glucose 15 minutes after you take the carbohydrate. If the repeat blood glucose level is still at or below 70 mg/dL (3.9 mmol/L), take 15 grams of a carbohydrate again. If your blood glucose level does not increase above 70 mg/dL (3.9 mmol/L) after 3 tries, seek emergency medical care. After your blood glucose level returns to normal, eat a meal or a snack within 1 hour.  Treating severe hypoglycemia Severe hypoglycemia is when your blood glucose level is below 54 mg/dL (3 mmol/L). Severe hypoglycemia is a medical emergency. Get medical help right away. If you have severe hypoglycemia and you cannot eat or drink, you will need to be given glucagon. A family member or close friend should learn how to check your blood glucose and how to give you glucagon. Ask your health care provider if you need to have an emergency glucagon kit available. Severe hypoglycemia may need to be treated in a hospital. The treatment may include getting glucose through an IV. You may also need treatment for the cause of your hypoglycemia. Follow these instructions at home:  General instructions Take over-the-counter and prescription medicines only as told by your health care provider. Monitor your blood glucose as told by your  health care provider. If you drink alcohol: Limit how much you have to: 0-1 drink a day for women who are not pregnant. 0-2 drinks a day for men. Know how much alcohol is in your drink. In the [place] one drink equals one 12 oz bottle of beer (355 mL), one 5 oz glass of wine (148 mL), or one 1 oz glass of hard liquor (44 mL). Be sure to eat food along with drinking alcohol. Be aware that alcohol is absorbed quickly and may have lingering effects that may result in hypoglycemia later. Be sure to do ongoing glucose monitoring. Keep all follow-up visits. This is important. If you have diabetes: Always have a fast-acting carbohydrate (15 grams) option with you to treat low blood glucose. Follow your diabetes management plan as directed by your health care provider. Make sure you: Know the symptoms of hypoglycemia. It is important to treat it right away to prevent it from becoming severe. Check your blood glucose as often as told. Always check before and after exercise. Always check your blood glucose before you drive a motorized vehicle. Take your medicines as told. Follow your meal plan. Eat on time, and do not skip meals. Share your  diabetes management plan with people in your workplace, school, and household. Carry a medical alert card or wear medical alert jewelry. Where to find more information American Diabetes Association: www.diabetes.org Contact a health care provider if: You have problems keeping your blood glucose in your target range. You have frequent episodes of hypoglycemia. Get help right away if: You continue to have hypoglycemia symptoms after eating or drinking something that contains 15 grams of fast-acting carbohydrate, and you cannot get your blood glucose above 70 mg/dL (3.9 mmol/L) while following the 15:15 rule. Your blood glucose is below 54 mg/dL (3 mmol/L). You have a seizure. You faint. These symptoms may represent a serious problem that is an emergency. Do not  wait to see if the symptoms will go away. Get medical help right away. Call your local emergency services (911 in the [place] Do not drive yourself to the hospital. Summary Hypoglycemia occurs when the level of sugar (glucose) in the blood is too low. Hypoglycemia can happen in people who have or do not have diabetes. It can develop quickly, and it can be a medical emergency. Make sure you know the symptoms of hypoglycemia and how to treat it. Always have a fast-acting carbohydrate option with you to treat low blood sugar. This information is not intended to replace advice given to you by your health care provider. Make sure you discuss any questions you have with your health care provider. Document Revised: 12/11/2019 Document Reviewed: 12/11/2019 Elsevier Patient Education  2023 [place]   Jacqlyn Larsen [place], BSN RN Case Advertising copywriter Family Medicine [phone]

## 2021-06-30 ENCOUNTER — Other Ambulatory Visit: Payer: Self-pay

## 2021-07-06 DIAGNOSIS — F339 Major depressive disorder, recurrent, unspecified: Secondary | ICD-10-CM | POA: Diagnosis not present

## 2021-07-08 DIAGNOSIS — Z6830 Body mass index (BMI) 30.0-30.9, adult: Secondary | ICD-10-CM | POA: Diagnosis not present

## 2021-07-08 DIAGNOSIS — M5416 Radiculopathy, lumbar region: Secondary | ICD-10-CM | POA: Diagnosis not present

## 2021-07-12 ENCOUNTER — Other Ambulatory Visit: Payer: Self-pay | Admitting: *Deleted

## 2021-07-12 DIAGNOSIS — M5116 Intervertebral disc disorders with radiculopathy, lumbar region: Secondary | ICD-10-CM

## 2021-07-12 MED ORDER — GABAPENTIN 400 MG PO CAPS: ORAL_CAPSULE | ORAL | 0 refills | Status: DC

## 2021-07-15 ENCOUNTER — Other Ambulatory Visit: Payer: Self-pay | Admitting: *Deleted

## 2021-07-15 MED ORDER — LOSARTAN POTASSIUM 100 MG PO TABS: 100.0000 mg | ORAL_TABLET | Freq: Every day | ORAL | 0 refills | Status: DC

## 2021-07-19 ENCOUNTER — Telehealth: Payer: Self-pay | Admitting: *Deleted

## 2021-07-19 ENCOUNTER — Ambulatory Visit (INDEPENDENT_AMBULATORY_CARE_PROVIDER_SITE_OTHER): Payer: Medicare Other | Admitting: Family Medicine

## 2021-07-19 VITALS — BP 130/82 | HR 92 | Temp 98.4°F | Wt 167.0 lb

## 2021-07-19 DIAGNOSIS — E1169 Type 2 diabetes mellitus with other specified complication: Secondary | ICD-10-CM

## 2021-07-19 DIAGNOSIS — M5116 Intervertebral disc disorders with radiculopathy, lumbar region: Secondary | ICD-10-CM | POA: Diagnosis not present

## 2021-07-19 DIAGNOSIS — I10 Essential (primary) hypertension: Secondary | ICD-10-CM

## 2021-07-19 DIAGNOSIS — Z1231 Encounter for screening mammogram for malignant neoplasm of breast: Secondary | ICD-10-CM | POA: Diagnosis not present

## 2021-07-19 DIAGNOSIS — E119 Type 2 diabetes mellitus without complications: Secondary | ICD-10-CM | POA: Diagnosis not present

## 2021-07-19 DIAGNOSIS — Z794 Long term (current) use of insulin: Secondary | ICD-10-CM

## 2021-07-19 DIAGNOSIS — E785 Hyperlipidemia, unspecified: Secondary | ICD-10-CM

## 2021-07-19 MED ORDER — OZEMPIC (0.25 OR 0.5 MG/DOSE) 2 MG/3ML ~~LOC~~ SOPN: PEN_INJECTOR | SUBCUTANEOUS | 1 refills | Status: DC

## 2021-07-19 MED ORDER — GABAPENTIN 400 MG PO CAPS: ORAL_CAPSULE | ORAL | 5 refills | Status: DC

## 2021-07-20 MED ORDER — SEMAGLUTIDE (1 MG/DOSE) 4 MG/3ML ~~LOC~~ SOPN: 1.0000 mg | PEN_INJECTOR | SUBCUTANEOUS | 2 refills | Status: DC

## 2021-07-20 NOTE — Telephone Encounter (Signed)
Medication sent to pharmacy and patient informed. Pt to call back in 3 weeks with update.

## 2021-08-01 ENCOUNTER — Ambulatory Visit (HOSPITAL_COMMUNITY)
Admission: RE | Admit: 2021-08-01 | Discharge: 2021-08-01 | Disposition: A | Discharge status: Home / Self Care | Payer: Medicare Other | Source: Ambulatory Visit | Attending: Family Medicine | Admitting: Family Medicine

## 2021-08-01 DIAGNOSIS — Z1231 Encounter for screening mammogram for malignant neoplasm of breast: Secondary | ICD-10-CM | POA: Insufficient documentation

## 2021-08-03 ENCOUNTER — Other Ambulatory Visit: Payer: Self-pay | Admitting: *Deleted

## 2021-08-03 ENCOUNTER — Encounter: Payer: Self-pay | Admitting: Family Medicine

## 2021-08-03 DIAGNOSIS — Z79899 Other long term (current) drug therapy: Secondary | ICD-10-CM

## 2021-08-03 DIAGNOSIS — E119 Type 2 diabetes mellitus without complications: Secondary | ICD-10-CM

## 2021-08-03 DIAGNOSIS — E1169 Type 2 diabetes mellitus with other specified complication: Secondary | ICD-10-CM

## 2021-08-03 DIAGNOSIS — I10 Essential (primary) hypertension: Secondary | ICD-10-CM

## 2021-08-03 MED ORDER — METFORMIN HCL 500 MG PO TABS: ORAL_TABLET | ORAL | 0 refills | Status: DC

## 2021-08-03 NOTE — Telephone Encounter (Signed)
Nurses-I would recommend a systematic decline in metformin Recommend 500 mg 1 daily #30 no refills Also order A1c, lipid, met 7, liver for patient to do before her follow-up visit in October You may share message with Lekeya Rollings -glad the dosage is working out.  I would recommend declining the metformin to 500 mg 1 daily for the next 2 weeks then 1/2 tablet daily for 1 week then stop  Please give [place] feedback in another 3 to 4 weeks how the dosage is working out for you of the Ozempic and if you are interested in going up on the dosage or maintaining as is until your follow-up visit in October  Thanks-Dr. Nicki Reaper

## 2021-08-04 ENCOUNTER — Other Ambulatory Visit: Payer: Self-pay

## 2021-08-04 MED ORDER — METFORMIN HCL 500 MG PO TABS: ORAL_TABLET | ORAL | 1 refills | Status: DC

## 2021-08-16 ENCOUNTER — Other Ambulatory Visit: Payer: Self-pay | Admitting: Family Medicine

## 2021-08-16 ENCOUNTER — Other Ambulatory Visit (HOSPITAL_COMMUNITY): Payer: Self-pay | Admitting: Hematology

## 2021-08-16 DIAGNOSIS — E559 Vitamin D deficiency, unspecified: Secondary | ICD-10-CM

## 2021-08-17 DIAGNOSIS — F339 Major depressive disorder, recurrent, unspecified: Secondary | ICD-10-CM | POA: Diagnosis not present

## 2021-08-24 ENCOUNTER — Ambulatory Visit: Payer: Medicare Other | Admitting: *Deleted

## 2021-08-24 DIAGNOSIS — I10 Essential (primary) hypertension: Secondary | ICD-10-CM

## 2021-08-24 DIAGNOSIS — Z794 Long term (current) use of insulin: Secondary | ICD-10-CM

## 2021-08-24 NOTE — Patient Instructions (Signed)
Visit Information  Thank you for taking time to visit with me today. Please don't hesitate to contact me if I can be of assistance to you before our next scheduled telephone appointment.  Following are the goals we discussed today:  Take medications as prescribed   Attend all scheduled provider appointments Call pharmacy for medication refills 3-7 days in advance of running out of medications Attend church or other social activities Perform all self care activities independently  Perform IADL's (shopping, preparing meals, housekeeping, managing finances) independently Call provider office for new concerns or questions  check blood sugar at prescribed times: once daily check feet daily for cuts, sores or redness enter blood sugar readings and medication or insulin into daily log take the blood sugar log to all doctor visits take the blood sugar meter to all doctor visits fill half of plate with vegetables limit fast food meals to no more than 1 per week manage portion size read food labels for fat, fiber, carbohydrates and portion size check blood pressure 3 times per week choose a place to take my blood pressure (home, clinic or office, retail store) keep all doctor appointments take medications for blood pressure exactly as prescribed begin an exercise program report new symptoms to your doctor eat more whole grains, fruits and vegetables, lean meats and healthy fats Please purchase a new blood pressure cuff Follow low sodium and carbohydrate modified diet Pharmacist will be calling you for assistance with ozempic Case closure for RN care management services fall prevention strategies: change position slowly, use assistive device such as walker or cane (per provider recommendations) when walking, keep walkways clear, have good lighting in room. It is important to contact your provider if you have any falls, maintain muscle strength/tone by exercise per provider  recommendations.   Please call the care guide team at [phone] if you need to cancel or reschedule your appointment.   If you are experiencing a Mental Health or [place] or need someone to talk to, please call the Suicide and Crisis Lifeline: 988 call the [place] National Suicide Prevention Lifeline: (720)637-3729 or TTY: (445)432-3532 TTY (605)039-2308) to talk to a trained counselor call 1-800-273-TALK (toll free, 24 hour hotline) go to [facility] [address], [place] [phone]) call the [place]: [phone] call 911   Patient verbalizes understanding of instructions and care plan provided today and agrees to view in [place]. Active MyChart status and patient understanding of how to access instructions and care plan via MyChart confirmed with patient.     No further follow up required: case closure for RN care management, pharmacist will outreach you for assistance with ozempic  Jacqlyn Larsen [facility], BSN RN Case Manager [place] [phone]

## 2021-08-24 NOTE — Chronic Care Management (AMB) (Signed)
Care Management    RN Visit Note  08/24/2021 Name: Carrie Cook MRN: [id number] DOB: 1960-05-23  Subjective: Carrie Cook is a 61 y.o. year old female who is a primary care patient of Luking, Carrie Snare, MD. The care management team was consulted for assistance with disease management and care coordination needs.    Engaged with patient by telephone for follow up visit in response to provider referral for case management and/or care coordination services.   Consent to Services:   [name] was given information about Care Management services today including:  Care Management services includes personalized support from designated clinical staff supervised by her physician, including individualized plan of care and coordination with other care providers 24/7 contact phone numbers for assistance for urgent and routine care needs. The patient may stop case management services at any time by phone call to the office staff.  Patient agreed to services and consent obtained.   Assessment: Review of patient past medical history, allergies, medications, health status, including review of consultants reports, laboratory and other test data, was performed as part of comprehensive evaluation and provision of chronic care management services.   SDOH (Social Determinants of Health) assessments and interventions performed:    Care Plan  Allergies  Allergen Reactions   Qvar [Beclomethasone] Shortness Of Breath and Other (See Comments)    Patient states that this medication causes her to feel short of breath   Sulfa Antibiotics Hives and Rash   Tizanidine Shortness Of Breath, Itching and Rash   Ace Inhibitors Cough   Hydrochlorothiazide Cough   Prozac [Fluoxetine Hcl] Hives   Sulfonamide Derivatives Hives and Rash   Dyazide [Hydrochlorothiazide W-Triamterene] Cough   Erythromycin Hives and Other (See Comments)    nausea   Lipitor [Atorvastatin Calcium] Other (See Comments)    Body aches,  flu-like symptoms   Lisinopril Cough   Pravastatin Other (See Comments)    BODY ACHES, FLU-LIKE SX.    Outpatient Encounter Medications as of 08/24/2021  Medication Sig Note   albuterol (VENTOLIN HFA) 108 (90 Base) MCG/ACT inhaler Inhale 2 puffs into the lungs every 6 (six) hours as needed for wheezing.    amLODipine (NORVASC) 2.5 MG tablet TAKE 1 TABLET(2.5 MG) BY MOUTH DAILY    cetirizine (ZYRTEC) 10 MG tablet Take 10 mg by mouth daily.    cloNIDine (CATAPRES) 0.1 MG tablet Take 0.1 mg by mouth 2 (two) times daily as needed.    desvenlafaxine (PRISTIQ) 100 MG 24 hr tablet Take by mouth.    estradiol (ESTRACE) 2 MG tablet TAKE 1 TABLET(2 MG) BY MOUTH AT BEDTIME    fluticasone (FLOVENT HFA) 220 MCG/ACT inhaler Inhale 2 puffs into the lungs 2 (two) times daily. (Patient taking differently: Inhale 2 puffs into the lungs daily as needed (Shortness of breath).)    gabapentin (NEURONTIN) 400 MG capsule TAKE 1 CAPSULE(400 MG) BY MOUTH FOUR TIMES DAILY    Insulin Pen Needle (PEN NEEDLES) 32G X 4 MM MISC 32 g by Does not apply route daily.    lamoTRIgine (LAMICTAL) 100 MG tablet Take 100 mg by mouth 2 (two) times daily.  07/19/2021: 100 AM, 150 MG AT BEDTIME   losartan (COZAAR) 100 MG tablet Take 1 tablet (100 mg total) by mouth daily.    metFORMIN (GLUCOPHAGE) 500 MG tablet Take 1 tablet by mouth once a day    Multiple Vitamin (MULTIVITAMIN WITH MINERALS) TABS tablet Take 1 tablet by mouth daily.    rosuvastatin (CRESTOR) 20  MG tablet Take 1 tablet by mouth once daily    Semaglutide, 1 MG/DOSE, 4 MG/3ML SOPN Inject 1 mg into the skin once a week.    Vitamin D, Ergocalciferol, (DRISDOL) 1.25 MG (50000 UNIT) CAPS capsule TAKE ONE CAPSULE BY MOUTH ONCE A WEEK    amoxicillin (AMOXIL) 500 MG capsule Take 2,000 mg by mouth See admin instructions. Take 4 capsules (2000 mg) by mouth 1 hour prior to dental appointment (Patient not taking: Reported on 06/15/2021)    No facility-administered encounter  medications on file as of 08/24/2021.    Patient Active Problem List   Diagnosis Date Noted   Status post lumbar spine surgery for decompression of spinal cord 04/12/2020   Abnormality of gait 04/12/2020   Neuropathic pain 04/12/2020   Muscle spasm    Essential hypertension    Drug induced constipation    Postoperative pain    Lumbar disc herniation with myelopathy 02/24/2020   Left foot drop 02/14/2020   Lumbar spinal stenosis 02/14/2020   Lumbar radiculopathy 02/13/2020   Acute right-sided back pain with sciatica 10/02/2019   S/P total knee replacement, right 07/22/19  09/01/2019   Primary localized osteoarthritis of left knee 07/22/2019   Primary osteoarthritis of right knee    Constipation 09/05/2018   Abdominal pain, chronic, epigastric 05/27/2018   Dysphagia 05/27/2018   S/P complete hysterectomy 06/06/2017   Derangement of posterior horn of medial meniscus of right knee    Right knee pain 09/10/2015   Iron deficiency anemia 10/16/2013   Fibroids 08/21/2013   Excessive or frequent menstruation 06/19/2013   Obstructive sleep apnea 06/21/2012   Chest pain    Hyperlipidemia associated with type 2 diabetes mellitus ([place])    Type 2 diabetes mellitus ([place])    Obesity    Laboratory test    Nephrolithiasis    Asthma    Endometriosis    Hypertension    Iron deficiency anemia 05/11/2009   GERD 05/11/2009    Conditions to be addressed/monitored: HTN and DMII  Care Plan : RN Care Manager Plan of Care  Updates made by Kassie Mends, RN since 08/24/2021 12:00 AM  Completed 08/24/2021   Problem: No plan of care established for management of chronic disease state  (DM2, HTN) Resolved 08/24/2021  Priority: High     Long-Range Goal: Development of plan of care for chronic disease management  (DM2, HTN) Completed 08/24/2021  Start Date: 06/15/2021  Expected End Date: 12/12/2021  Priority: High  Note:   Current Barriers:  Knowledge Deficits related to plan of care for management of  HTN and DMII  Chronic Disease Management support and education needs related to HTN and DMII Patient reports she lives with spouse and is overall independent with ADL, IADL's, has had at least 2 falls in the past year, has had back surgeries which has also hindered her ability to exercise, has depression PHQ9=7 and sees mental health professional and is on medication, does not have advanced directives and has information to complete.   Patient reports she did start checking CBG although not every day but plans to start checking daily, states fasting ranges are all below 150, random ranges are all below 200.  Pt reports metformin has been decreased and increasing ozempic dosage, pt states this is working well and she has lost 30 pounds with ozempic.  Pt reports she is having difficulty affording ozempic at present with cost 250-300$ per month and ask if [facility] pharmacist can outreach her. Patient does not  check blood pressure, states she has checked at provider appointments.  RNCM Clinical Goal(s):  Patient will verbalize understanding of plan for management of HTN and DMII as evidenced by patient report, review of EHR and  through collaboration with RN Care manager, provider, and care team.   Interventions: 1:1 collaboration with primary care provider regarding development and update of comprehensive plan of care as evidenced by provider attestation and co-signature Inter-disciplinary care team collaboration (see longitudinal plan of care) Evaluation of current treatment plan related to  self management and patient's adherence to plan as established by provider   Diabetes Interventions:  (Status:  New goal. and Goal on track:  Yes.) Long Term Goal Assessed patient's understanding of A1c goal: <7% Reviewed medications with patient and discussed importance of medication adherence Provided patient with written educational materials related to hypo and hyperglycemia and importance of correct  treatment Review of patient status, including review of consultants reports, relevant laboratory and other test results, and medications completed Reviewed signs/ symptoms hypoglycemia Reviewed carbohydrate modified diet Pharmacy referral placed for medication assistance ozempic Lab Results  Component Value Date   HGBA1C 6.1 (H) 05/13/2021   Hypertension Interventions:  (Status:  New goal. and Goal on track:  Yes.) Long Term Goal Last practice recorded BP readings:  BP Readings from Last 3 Encounters:  03/03/21 134/88  12/22/20 109/66  12/10/20 (!) 163/87  Most recent eGFR/CrCl:  Lab Results  Component Value Date   EGFR 98 05/13/2021    No components found for: CRCL  Evaluation of current treatment plan related to hypertension self management and patient's adherence to plan as established by provider Reviewed medications with patient and discussed importance of compliance Counseled on the importance of exercise goals with target of 150 minutes per week Discussed complications of poorly controlled blood pressure such as heart disease, stroke, circulatory complications, vision complications, kidney impairment, sexual dysfunction Ask patient to obtain new blood pressure cuff and check blood pressure, keep log Reinforced low sodium diet  Reviewed safety precautions Plan of care reviewed with patient including case closure today for RN care manager (pharmacist will outreach), pt feels she is able to self manage and does not need further nursing services  Patient Goals/Self-Care Activities: Take medications as prescribed   Attend all scheduled provider appointments Call pharmacy for medication refills 3-7 days in advance of running out of medications Attend church or other social activities Perform all self care activities independently  Perform IADL's (shopping, preparing meals, housekeeping, managing finances) independently Call provider office for new concerns or questions  check  blood sugar at prescribed times: once daily check feet daily for cuts, sores or redness enter blood sugar readings and medication or insulin into daily log take the blood sugar log to all doctor visits take the blood sugar meter to all doctor visits fill half of plate with vegetables limit fast food meals to no more than 1 per week manage portion size read food labels for fat, fiber, carbohydrates and portion size check blood pressure 3 times per week choose a place to take my blood pressure (home, clinic or office, retail store) keep all doctor appointments take medications for blood pressure exactly as prescribed begin an exercise program report new symptoms to your doctor eat more whole grains, fruits and vegetables, lean meats and healthy fats Please purchase a new blood pressure cuff Follow low sodium and carbohydrate modified diet Pharmacist will be calling you for assistance with ozempic Case closure for RN care management services fall  prevention strategies: change position slowly, use assistive device such as walker or cane (per provider recommendations) when walking, keep walkways clear, have good lighting in room. It is important to contact your provider if you have any falls, maintain muscle strength/tone by exercise per provider recommendations.       Plan: No further follow up required: case closure  Jacqlyn Larsen [facility], BSN RN Case Manager [place] [phone]

## 2021-08-24 NOTE — Progress Notes (Signed)
Routed to [place]: [phone]

## 2021-09-08 ENCOUNTER — Telehealth: Payer: Self-pay | Admitting: Pharmacy Technician

## 2021-09-08 DIAGNOSIS — Z596 Low income: Secondary | ICD-10-CM

## 2021-09-08 NOTE — Progress Notes (Signed)
[facility])                                            [place] Team    09/08/2021  Carrie Cook 1961-01-10 [id number]                                      Medication Assistance Referral  Referral From: [place]   Medication/Company: [employer] / [employer] Patient application portion:  Education officer, museum portion: Faxed  to Dr. Sallee Lange Provider address/fax verified via: Office website    [employer]. Nabil Bubolz, [place]  [phone]

## 2021-10-05 ENCOUNTER — Telehealth: Payer: Self-pay | Admitting: Pharmacy Technician

## 2021-10-05 DIAGNOSIS — Z596 Low income: Secondary | ICD-10-CM

## 2021-10-05 NOTE — Progress Notes (Signed)
[facility])                                            [place] Team    10/05/2021  ZNYA ALBINO Jun 30, 1960 [id number]  Received both patient and provider portion(s) of patient assistance application(s) for Ozempic. Faxed completed application and required documents into [employer].   Eddith Mentor P. Kota Ciancio, [place]  [phone]

## 2021-10-07 ENCOUNTER — Telehealth: Payer: Self-pay | Admitting: Pharmacy Technician

## 2021-10-07 DIAGNOSIS — Z596 Low income: Secondary | ICD-10-CM

## 2021-10-07 NOTE — Progress Notes (Signed)
[facility])                                            [place] Team    10/07/2021  Carrie Cook June 27, 1960 [id number]  Care coordination call placed to [place] in regard to [place] application.  Spoke to [place] who informs patient is APPROVED 10/07/21-01/22/22, She informs 5 boxes will be delivered to the provider's office in the next 15-20 business days. Patient is aware of her approval.  Carrie Cook, [place]  [phone]

## 2021-10-11 ENCOUNTER — Other Ambulatory Visit: Payer: Self-pay | Admitting: *Deleted

## 2021-10-11 MED ORDER — SEMAGLUTIDE (1 MG/DOSE) 4 MG/3ML ~~LOC~~ SOPN: 1.0000 mg | PEN_INJECTOR | SUBCUTANEOUS | 2 refills | Status: DC

## 2021-10-17 ENCOUNTER — Other Ambulatory Visit: Payer: Self-pay | Admitting: *Deleted

## 2021-10-17 MED ORDER — LOSARTAN POTASSIUM 100 MG PO TABS: 100.0000 mg | ORAL_TABLET | Freq: Every day | ORAL | 0 refills | Status: DC

## 2021-11-09 DIAGNOSIS — F339 Major depressive disorder, recurrent, unspecified: Secondary | ICD-10-CM | POA: Diagnosis not present

## 2021-11-14 ENCOUNTER — Other Ambulatory Visit: Payer: Self-pay | Admitting: Family Medicine

## 2021-11-14 ENCOUNTER — Other Ambulatory Visit: Payer: Self-pay

## 2021-11-14 MED ORDER — ROSUVASTATIN CALCIUM 20 MG PO TABS: ORAL_TABLET | ORAL | 1 refills | Status: DC

## 2021-11-15 DIAGNOSIS — Z794 Long term (current) use of insulin: Secondary | ICD-10-CM | POA: Diagnosis not present

## 2021-11-15 DIAGNOSIS — E1169 Type 2 diabetes mellitus with other specified complication: Secondary | ICD-10-CM | POA: Diagnosis not present

## 2021-11-15 DIAGNOSIS — E785 Hyperlipidemia, unspecified: Secondary | ICD-10-CM | POA: Diagnosis not present

## 2021-11-15 DIAGNOSIS — E119 Type 2 diabetes mellitus without complications: Secondary | ICD-10-CM | POA: Diagnosis not present

## 2021-11-15 DIAGNOSIS — I10 Essential (primary) hypertension: Secondary | ICD-10-CM | POA: Diagnosis not present

## 2021-11-15 DIAGNOSIS — Z79899 Other long term (current) drug therapy: Secondary | ICD-10-CM | POA: Diagnosis not present

## 2021-11-16 LAB — HEPATIC FUNCTION PANEL
ALT: 32 IU/L (ref 0–32)
AST: 28 IU/L (ref 0–40)
Albumin: 4.5 g/dL (ref 3.8–4.9)
Alkaline Phosphatase: 108 IU/L (ref 44–121)
Bilirubin Total: 0.3 mg/dL (ref 0.0–1.2)
Bilirubin, Direct: 0.1 mg/dL (ref 0.00–0.40)
Total Protein: 6.5 g/dL (ref 6.0–8.5)

## 2021-11-16 LAB — BASIC METABOLIC PANEL
BUN/Creatinine Ratio: 11 (ref 9–23)
BUN: 9 mg/dL (ref 6–24)
CO2: 22 mmol/L (ref 20–29)
Calcium: 9.1 mg/dL (ref 8.7–10.2)
Chloride: 102 mmol/L (ref 96–106)
Creatinine, Ser: 0.83 mg/dL (ref 0.57–1.00)
Glucose: 128 mg/dL — ABNORMAL HIGH (ref 70–99)
Potassium: 4.5 mmol/L (ref 3.5–5.2)
Sodium: 139 mmol/L (ref 134–144)
eGFR: 81 mL/min/{1.73_m2} (ref >59)

## 2021-11-16 LAB — HEMOGLOBIN A1C
Est. average glucose Bld gHb Est-mCnc: 137 mg/dL
Hgb A1c MFr Bld: 6.4 % — ABNORMAL HIGH (ref 4.8–5.6)

## 2021-11-16 LAB — LIPID PANEL
Chol/HDL Ratio: 2.9 ratio (ref 0.0–4.4)
Cholesterol, Total: 144 mg/dL (ref 100–199)
HDL: 50 mg/dL (ref >39)
LDL Chol Calc (NIH): 72 mg/dL (ref 0–99)
Triglycerides: 124 mg/dL (ref 0–149)
VLDL Cholesterol Cal: 22 mg/dL (ref 5–40)

## 2021-11-18 ENCOUNTER — Telehealth: Payer: Self-pay

## 2021-11-18 ENCOUNTER — Ambulatory Visit (INDEPENDENT_AMBULATORY_CARE_PROVIDER_SITE_OTHER): Payer: Medicare Other | Admitting: Family Medicine

## 2021-11-18 ENCOUNTER — Encounter: Payer: Self-pay | Admitting: Family Medicine

## 2021-11-18 VITALS — BP 138/88 | Wt 166.6 lb

## 2021-11-18 DIAGNOSIS — Z794 Long term (current) use of insulin: Secondary | ICD-10-CM | POA: Diagnosis not present

## 2021-11-18 DIAGNOSIS — Z23 Encounter for immunization: Secondary | ICD-10-CM | POA: Diagnosis not present

## 2021-11-18 DIAGNOSIS — F322 Major depressive disorder, single episode, severe without psychotic features: Secondary | ICD-10-CM

## 2021-11-18 DIAGNOSIS — E1169 Type 2 diabetes mellitus with other specified complication: Secondary | ICD-10-CM

## 2021-11-18 DIAGNOSIS — E119 Type 2 diabetes mellitus without complications: Secondary | ICD-10-CM

## 2021-11-18 DIAGNOSIS — M5416 Radiculopathy, lumbar region: Secondary | ICD-10-CM

## 2021-11-18 DIAGNOSIS — I10 Essential (primary) hypertension: Secondary | ICD-10-CM | POA: Diagnosis not present

## 2021-11-18 DIAGNOSIS — E785 Hyperlipidemia, unspecified: Secondary | ICD-10-CM | POA: Diagnosis not present

## 2021-11-18 NOTE — Progress Notes (Signed)
   Subjective:    Patient ID: Carrie Cook, female    DOB: [birth year], 61 y.o.   MRN: [id number]  Hyperlipidemia This is a chronic problem. There are no compliance problems.  Risk factors for coronary artery disease include diabetes mellitus and hypertension.  Hypertension This is a chronic problem. Risk factors for coronary artery disease include diabetes mellitus and dyslipidemia. There are no compliance problems.   Diabetes She presents for her follow-up diabetic visit. She has type 2 diabetes mellitus. There are no hypoglycemic associated symptoms. There are no diabetic associated symptoms. There are no hypoglycemic complications. There are no diabetic complications. Risk factors for coronary artery disease include hypertension and dyslipidemia. She does not see a podiatrist.Eye exam is not current.   Patient has been dealing with severe depression At times she feels she be better off dead and thinks about running her car into a river but she states she is not going to do this currently she is seeing a psychiatrist and will be starting counseling She talked about hoping to find more of a purpose in life we did discuss at length about trying to geared toward developing a new purpose in life we also reflected on what she values most which is her grandchildren and siblings.  Based on all this she does not want to hurt herself currently   Review of Systems     Objective:   Physical Exam  General-in no acute distress Eyes-no discharge Lungs-respiratory rate normal, CTA CV-no murmurs,RRR Extremities skin warm dry no edema Neuro grossly normal Behavior normal, alert       Assessment & Plan:  1. Need for vaccination Flu shot today - Flu Vaccine QUAD 6+ mos PF IM (Fluarix Quad PF)  2. Depression, major, single episode, severe ([place]) Patient on medications She does do counseling through [facility] Patient states she has had suicidal ideation but is not going to hurt herself  currently She states that if she did try to hurt herself she is more likely to drive her car into a river but states she is not going to do that currently She would like to be around for her grandchildren We did talk about a safety plan We also talked about seeing her psychiatrist on a regular basis and counselor Also talked about how to go to the ER if she feels like things are getting out of control for behavioral health evaluation Once again patient states she is not going to hurt herself currently Very important for her to follow-up with psychiatry and also follow-up with counseling she is already plugged into these according to her  3. Primary hypertension Blood pressure good control  4. Type 2 diabetes mellitus without complication, with long-term current use of insulin (HCC) Beatties diet Beatties good control  5. Hyperlipidemia associated with type 2 diabetes mellitus ([place]) Last drawl under good control  6. Lumbar radiculopathy Stretching exercises

## 2021-11-18 NOTE — Progress Notes (Signed)
[facility])  [place] Team    11/18/2021  Carrie Cook Jan 22, 1961 [id number]  Reason for referral: Medication Assistance for Ozempic  Referral source:  [place] Technician Current insurance:  Medicare    Outreach:  Successful telephone call with Carrie Cook.  HIPAA identifiers verified.   Subjective:  I was able to speak to with the patient and do a 2024 patient assistance program (PAP) re-enrollment screening for Ozempic. She reports no change in income or address, she did mention that her Ozempic dose may be increasing, will follow-up on new dose.    Plan: Will follow-up in 1-3 business days.  Thanks,  Reed Breech, [place] [phone]

## 2021-12-23 ENCOUNTER — Telehealth: Payer: Self-pay | Admitting: Pharmacy Technician

## 2021-12-23 DIAGNOSIS — Z596 Low income: Secondary | ICD-10-CM

## 2021-12-23 DIAGNOSIS — Z5986 Financial insecurity: Secondary | ICD-10-CM

## 2021-12-23 NOTE — Progress Notes (Signed)
[facility])                                            [place] Team    12/23/2021  Carrie Cook 01-12-1961 [id number]                                      Medication Assistance Referral-FOR 2023 RE ENROLLMENT  Referral From: [place]   Medication/Company: [employer] / [employer] Patient application portion:  Education officer, museum portion: Faxed  to Dr. Sallee Lange Provider address/fax verified via: Office website  [employer]. [name], [place]  [phone]

## 2021-12-26 ENCOUNTER — Ambulatory Visit (INDEPENDENT_AMBULATORY_CARE_PROVIDER_SITE_OTHER): Payer: Medicare Other | Admitting: Family Medicine

## 2021-12-26 VITALS — BP 118/70 | HR 85 | Temp 98.1°F | Wt 168.0 lb

## 2021-12-26 DIAGNOSIS — Z794 Long term (current) use of insulin: Secondary | ICD-10-CM | POA: Diagnosis not present

## 2021-12-26 DIAGNOSIS — E7849 Other hyperlipidemia: Secondary | ICD-10-CM | POA: Diagnosis not present

## 2021-12-26 DIAGNOSIS — E1169 Type 2 diabetes mellitus with other specified complication: Secondary | ICD-10-CM

## 2021-12-26 DIAGNOSIS — E119 Type 2 diabetes mellitus without complications: Secondary | ICD-10-CM | POA: Diagnosis not present

## 2021-12-26 DIAGNOSIS — E785 Hyperlipidemia, unspecified: Secondary | ICD-10-CM | POA: Diagnosis not present

## 2021-12-26 DIAGNOSIS — I10 Essential (primary) hypertension: Secondary | ICD-10-CM

## 2021-12-26 NOTE — Progress Notes (Signed)
   Subjective:    Patient ID: Carrie Cook, female    DOB: August 31, 1960, 61 y.o.   MRN: [id number]  HPI Patient arrives today for follow up for medications. Patient is here to discuss increasing the Semaglutide to '2mg'$ . Patient with underlying depression This is still present but she has had discussion with her husband as well as family members to try to solicit their help.  In addition to this doing counseling through the psychiatrist.  Not in individual counseling currently Diabetes decent control Tolerating medicine well but would like to go up on the dose of the Ozempic Blood pressure under good control    Review of Systems     Objective:   Physical Exam  General-in no acute distress Eyes-no discharge Lungs-respiratory rate normal, CTA CV-no murmurs,RRR Extremities skin warm dry no edema Neuro grossly normal Behavior normal, alert       Assessment & Plan:  1. Type 2 diabetes mellitus without complication, with long-term current use of insulin (HCC) Bump up dose of Ozempic.  Continue healthy eating.  Follow-up by April with lab work before that visit - Hemoglobin L4T - Basic metabolic panel - Microalbumin / creatinine urine ratio  2. Primary hypertension Blood pressure under good control  3. Other hyperlipidemia Continue statin - Lipid panel  4. Hyperlipidemia associated with type 2 diabetes mellitus ([place]) Continue statin healthy diet check labs before next visit  Under management of [facility] for her depression but suicidal ideation has subsided she is doing the best she can at keeping her moods in a good range.  She has talked with her husband he has become more empathetic Continue antidepressants continue seeing psychiatry Recommend initiating some activity at the senior center

## 2021-12-26 NOTE — Patient Instructions (Signed)
Patient gets her Ozempic through to

## 2022-01-04 DIAGNOSIS — F339 Major depressive disorder, recurrent, unspecified: Secondary | ICD-10-CM | POA: Diagnosis not present

## 2022-01-10 ENCOUNTER — Telehealth: Payer: Self-pay | Admitting: Pharmacy Technician

## 2022-01-10 DIAGNOSIS — Z596 Low income: Secondary | ICD-10-CM

## 2022-01-10 NOTE — Progress Notes (Signed)
[facility])                                            [place] Team    01/10/2022  Carrie Cook [birth year] [id number]  Received both patient and provider portion(s) of patient assistance application(s) for Ozempic '2mg'$ . Faxed completed application and required documents into [employer].   Reyna Lorenzi P. Josua Ferrebee, [place]  [phone]

## 2022-01-12 ENCOUNTER — Other Ambulatory Visit: Payer: Self-pay | Admitting: Family Medicine

## 2022-01-19 ENCOUNTER — Other Ambulatory Visit: Payer: Self-pay | Admitting: Obstetrics & Gynecology

## 2022-01-25 ENCOUNTER — Telehealth: Payer: Self-pay | Admitting: Family Medicine

## 2022-01-25 NOTE — Telephone Encounter (Signed)
MyChart message sent to patient due to received fax from [place] a fax from [employer] stating that currently they are unable to process the request for Ozempic due to:     INCOME:  The number of people living in your household is missing or illegible. Please re-submit page 2, with your name, DOB, and # of people living in your household, or contact us to correct this error.   Pt replied via my chart-Could you send [employer] the requested info? Idamae Schuller d.o.b. 11-21-1960 Household 2 adults. I will also try to call them. Thank you for letting me know. I only have 3 weeks/doses left. Dr Wolfgang Phoenix increased my dosage to 20m but I haven't been able to increase for that dosage because I don't have enough   Mychart message to patient- I called [facility] the hold time is 30 minutes. I was able to go on the website and print out page 2 and put that information in the designated area and fax it back to [employer]. Hopefully that will suffice. If you get in touch with them or they call you or if anyone has questions, please let them know that they can give [facility] call.

## 2022-01-27 ENCOUNTER — Telehealth: Payer: Self-pay | Admitting: Pharmacy Technician

## 2022-01-27 DIAGNOSIS — Z596 Low income: Secondary | ICD-10-CM

## 2022-01-27 NOTE — Progress Notes (Signed)
[facility])                                            [place] Team    01/27/2022  Carrie Cook [birth year] [id number]  Care coordination call placed to [place] in regard to [place] '2mg'$  application.  Spoke to [place] who informs patient is APPROVED 01/23/22-01/23/23. Medication and subsequent refills will be auto shipped to the provider's office.  [name] P. Frazier Balfour, [place]  [phone]

## 2022-02-08 ENCOUNTER — Other Ambulatory Visit: Payer: Self-pay | Admitting: Family Medicine

## 2022-02-08 ENCOUNTER — Other Ambulatory Visit: Payer: Self-pay

## 2022-02-08 MED ORDER — ALBUTEROL SULFATE HFA 108 (90 BASE) MCG/ACT IN AERS: 2.0000 | INHALATION_SPRAY | Freq: Four times a day (QID) | RESPIRATORY_TRACT | 6 refills | Status: DC | PRN

## 2022-02-15 ENCOUNTER — Other Ambulatory Visit: Payer: Self-pay | Admitting: Family Medicine

## 2022-02-21 ENCOUNTER — Telehealth: Payer: Self-pay

## 2022-02-21 ENCOUNTER — Other Ambulatory Visit: Payer: Self-pay | Admitting: Family Medicine

## 2022-02-21 DIAGNOSIS — M5116 Intervertebral disc disorders with radiculopathy, lumbar region: Secondary | ICD-10-CM

## 2022-02-21 MED ORDER — GABAPENTIN 400 MG PO CAPS: ORAL_CAPSULE | ORAL | 5 refills | Status: DC

## 2022-02-21 NOTE — Telephone Encounter (Signed)
Refills was sent in please keep follow-up visit for later in the spring

## 2022-02-21 NOTE — Telephone Encounter (Signed)
Encourage patient to contact the pharmacy for refills or they can request refills through [facility]  (Please schedule appointment if patient has not been seen in over a year)    WHAT PHARMACY WOULD THEY LIKE THIS SENT TO: [facility]   MEDICATION NAME & DOSE:gabapentin (NEURONTIN) 400 MG capsule   NOTES/COMMENTS FROM PATIENT:      [place] office please notify patient: It takes 48-72 hours to process rx refill requests Ask patient to call pharmacy to ensure rx is ready before heading there.

## 2022-02-21 NOTE — Telephone Encounter (Signed)
Left message to return call 

## 2022-02-21 NOTE — Telephone Encounter (Signed)
Last seen 01/25/2022

## 2022-02-22 NOTE — Telephone Encounter (Signed)
Pt returned call and verbalized understanding  

## 2022-03-15 DIAGNOSIS — F339 Major depressive disorder, recurrent, unspecified: Secondary | ICD-10-CM | POA: Diagnosis not present

## 2022-03-23 ENCOUNTER — Encounter: Payer: Self-pay | Admitting: Radiology

## 2022-04-13 ENCOUNTER — Other Ambulatory Visit: Payer: Self-pay | Admitting: Family Medicine

## 2022-04-20 ENCOUNTER — Encounter (HOSPITAL_COMMUNITY): Payer: Self-pay | Admitting: Hematology and Oncology

## 2022-04-25 DIAGNOSIS — Z794 Long term (current) use of insulin: Secondary | ICD-10-CM | POA: Diagnosis not present

## 2022-04-25 DIAGNOSIS — E7849 Other hyperlipidemia: Secondary | ICD-10-CM | POA: Diagnosis not present

## 2022-04-25 DIAGNOSIS — E119 Type 2 diabetes mellitus without complications: Secondary | ICD-10-CM | POA: Diagnosis not present

## 2022-04-26 DIAGNOSIS — F339 Major depressive disorder, recurrent, unspecified: Secondary | ICD-10-CM | POA: Diagnosis not present

## 2022-04-26 LAB — LIPID PANEL
Chol/HDL Ratio: 3.3 ratio (ref 0.0–4.4)
Cholesterol, Total: 153 mg/dL (ref 100–199)
HDL: 47 mg/dL (ref >39)
LDL Chol Calc (NIH): 77 mg/dL (ref 0–99)
Triglycerides: 170 mg/dL — ABNORMAL HIGH (ref 0–149)
VLDL Cholesterol Cal: 29 mg/dL (ref 5–40)

## 2022-04-26 LAB — HEMOGLOBIN A1C
Est. average glucose Bld gHb Est-mCnc: 143 mg/dL
Hgb A1c MFr Bld: 6.6 % — ABNORMAL HIGH (ref 4.8–5.6)

## 2022-04-26 LAB — BASIC METABOLIC PANEL
BUN/Creatinine Ratio: 13 (ref 12–28)
BUN: 11 mg/dL (ref 8–27)
CO2: 22 mmol/L (ref 20–29)
Calcium: 9.4 mg/dL (ref 8.7–10.3)
Chloride: 101 mmol/L (ref 96–106)
Creatinine, Ser: 0.86 mg/dL (ref 0.57–1.00)
Glucose: 132 mg/dL — ABNORMAL HIGH (ref 70–99)
Potassium: 4.8 mmol/L (ref 3.5–5.2)
Sodium: 139 mmol/L (ref 134–144)
eGFR: 77 mL/min/{1.73_m2} (ref >59)

## 2022-04-26 LAB — MICROALBUMIN / CREATININE URINE RATIO
Creatinine, Urine: 254.3 mg/dL
Microalb/Creat Ratio: 12 mg/g creat (ref 0–29)
Microalbumin, Urine: 30.6 ug/mL

## 2022-04-27 ENCOUNTER — Ambulatory Visit: Payer: Medicare Other | Admitting: Family Medicine

## 2022-05-09 ENCOUNTER — Other Ambulatory Visit: Payer: Self-pay | Admitting: Family Medicine

## 2022-05-11 NOTE — Patient Instructions (Addendum)
Carrie Cook , Thank you for taking time to come for your Medicare Wellness Visit. I appreciate your ongoing commitment to your health goals. Please review the following plan we discussed and let me know if I can assist you in the future.   These are the goals we discussed:  Goals      Exercise 3x per week (30 min per time)     Encouraged pt to try chair exercises. "Get well enough to travel and take care of house."        This is a list of the screening recommended for you and due dates:  Health Maintenance  Topic Date Due   DTaP/Tdap/Td vaccine (1 - Tdap) Never done   Zoster (Shingles) Vaccine (1 of 2) Never done   Eye exam for diabetics  12/27/2014   Pap Smear  07/16/2015   COVID-19 Vaccine (3 - Moderna risk series) 06/24/2019   Medicare Annual Wellness Visit  05/04/2022   Complete foot exam   07/20/2022   Flu Shot  08/24/2022   Hemoglobin A1C  10/25/2022   Colon Cancer Screening  10/29/2022   Yearly kidney function blood test for diabetes  04/25/2023   Yearly kidney health urinalysis for diabetes  04/25/2023   Mammogram  08/02/2023   Hepatitis C Screening: USPSTF Recommendation to screen - Ages 18-79 yo.  Completed   HIV Screening  Completed   HPV Vaccine  Aged Out    Advanced directives: Please bring a copy of your health care power of attorney and living will to the office to be added to your chart at your convenience.   Conditions/risks identified: Aim for 30 minutes of exercise or brisk walking, 6-8 glasses of water, and 5 servings of fruits and vegetables each day.  Next appointment: Follow up in one year for your annual wellness visit.   Preventive Care 40-64 Years, Female Preventive care refers to lifestyle choices and visits with your health care provider that can promote health and wellness. What does preventive care include? A yearly physical exam. This is also called an annual well check. Dental exams once or twice a year. Routine eye exams. Ask your health  care provider how often you should have your eyes checked. Personal lifestyle choices, including: Daily care of your teeth and gums. Regular physical activity. Eating a healthy diet. Avoiding tobacco and drug use. Limiting alcohol use. Practicing safe sex. Taking low-dose aspirin daily starting at age 46. Taking vitamin and mineral supplements as recommended by your health care provider. What happens during an annual well check? The services and screenings done by your health care provider during your annual well check will depend on your age, overall health, lifestyle risk factors, and family history of disease. Counseling  Your health care provider may ask you questions about your: Alcohol use. Tobacco use. Drug use. Emotional well-being. Home and relationship well-being. Sexual activity. Eating habits. Work and work Astron[employer]. Method of birth control. Menstrual cycle. Pregnancy history. Screening  You may have the following tests or measurements: Height, weight, and BMI. Blood pressure. Lipid and cholesterol levels. These may be checked every 5 years, or more frequently if you are over 68 years old. Skin check. Lung cancer screening. You may have this screening every year starting at age 83 if you have a 30-pack-year history of smoking and currently smoke or have quit within the past 15 years. Fecal occult blood test (FOBT) of the stool. You may have this test every year starting at age 83. Flexible  sigmoidoscopy or colonoscopy. You may have a sigmoidoscopy every 5 years or a colonoscopy every 10 years starting at age 56. Hepatitis C blood test. Hepatitis B blood test. Sexually transmitted disease (STD) testing. Diabetes screening. This is done by checking your blood sugar (glucose) after you have not eaten for a while (fasting). You may have this done every 1-3 years. Mammogram. This may be done every 1-2 years. Talk to your health care provider about when you should start  having regular mammograms. This may depend on whether you have a family history of breast cancer. BRCA-related cancer screening. This may be done if you have a family history of breast, ovarian, tubal, or peritoneal cancers. Pelvic exam and Pap test. This may be done every 3 years starting at age 32. Starting at age 61, this may be done every 5 years if you have a Pap test in combination with an HPV test. Bone density scan. This is done to screen for osteoporosis. You may have this scan if you are at high risk for osteoporosis. Discuss your test results, treatment options, and if necessary, the need for more tests with your health care provider. Vaccines  Your health care provider may recommend certain vaccines, such as: Influenza vaccine. This is recommended every year. Tetanus, diphtheria, and acellular pertussis (Tdap, Td) vaccine. You may need a Td booster every 10 years. Zoster vaccine. You may need this after age 68. Pneumococcal 13-valent conjugate (PCV13) vaccine. You may need this if you have certain conditions and were not previously vaccinated. Pneumococcal polysaccharide (PPSV23) vaccine. You may need one or two doses if you smoke cigarettes or if you have certain conditions. Talk to your health care provider about which screenings and vaccines you need and how often you need them. This information is not intended to replace advice given to you by your health care provider. Make sure you discuss any questions you have with your health care provider. Document Released: 02/05/2015 Document Revised: 09/29/2015 Document Reviewed: 11/10/2014 Elsevier Interactive Patient Education  2017 [employer].    Fall Prevention in the Home Falls can cause injuries. They can happen to people of all ages. There are many things you can do to make your home safe and to help prevent falls. What can I do on the outside of my home? Regularly fix the edges of walkways and driveways and fix any  cracks. Remove anything that might make you trip as you walk through a door, such as a raised step or threshold. Trim any bushes or trees on the path to your home. Use bright outdoor lighting. Clear any walking paths of anything that might make someone trip, such as rocks or tools. Regularly check to see if handrails are loose or broken. Make sure that both sides of any steps have handrails. Any raised decks and porches should have guardrails on the edges. Have any leaves, snow, or ice cleared regularly. Use sand or salt on walking paths during winter. Clean up any spills in your garage right away. This includes oil or grease spills. What can I do in the bathroom? Use night lights. Install grab bars by the toilet and in the tub and shower. Do not use towel bars as grab bars. Use non-skid mats or decals in the tub or shower. If you need to sit down in the shower, use a plastic, non-slip stool. Keep the floor dry. Clean up any water that spills on the floor as soon as it happens. Remove soap buildup in the  tub or shower regularly. Attach bath mats securely with double-sided non-slip rug tape. Do not have throw rugs and other things on the floor that can make you trip. What can I do in the bedroom? Use night lights. Make sure that you have a light by your bed that is easy to reach. Do not use any sheets or blankets that are too big for your bed. They should not hang down onto the floor. Have a firm chair that has side arms. You can use this for support while you get dressed. Do not have throw rugs and other things on the floor that can make you trip. What can I do in the kitchen? Clean up any spills right away. Avoid walking on wet floors. Keep items that you use a lot in easy-to-reach places. If you need to reach something above you, use a strong step stool that has a grab bar. Keep electrical cords out of the way. Do not use floor polish or wax that makes floors slippery. If you must  use wax, use non-skid floor wax. Do not have throw rugs and other things on the floor that can make you trip. What can I do with my stairs? Do not leave any items on the stairs. Make sure that there are handrails on both sides of the stairs and use them. Fix handrails that are broken or loose. Make sure that handrails are as long as the stairways. Check any carpeting to make sure that it is firmly attached to the stairs. Fix any carpet that is loose or worn. Avoid having throw rugs at the top or bottom of the stairs. If you do have throw rugs, attach them to the floor with carpet tape. Make sure that you have a light switch at the top of the stairs and the bottom of the stairs. If you do not have them, ask someone to add them for you. What else can I do to help prevent falls? Wear shoes that: Do not have high heels. Have rubber bottoms. Are comfortable and fit you well. Are closed at the toe. Do not wear sandals. If you use a stepladder: Make sure that it is fully opened. Do not climb a closed stepladder. Make sure that both sides of the stepladder are locked into place. Ask someone to hold it for you, if possible. Clearly mark and make sure that you can see: Any grab bars or handrails. First and last steps. Where the edge of each step is. Use tools that help you move around (mobility aids) if they are needed. These include: Canes. Walkers. Scooters. Crutches. Turn on the lights when you go into a dark area. Replace any light bulbs as soon as they burn out. Set up your furniture so you have a clear path. Avoid moving your furniture around. If any of your floors are uneven, fix them. If there are any pets around you, be aware of where they are. Review your medicines with your doctor. Some medicines can make you feel dizzy. This can increase your chance of falling. Ask your doctor what other things that you can do to help prevent falls. This information is not intended to replace  advice given to you by your health care provider. Make sure you discuss any questions you have with your health care provider. Document Released: 11/05/2008 Document Revised: 06/17/2015 Document Reviewed: 02/13/2014 Elsevier Interactive Patient Education  2017 [employer].

## 2022-05-11 NOTE — Progress Notes (Signed)
Subjective:   Carrie Cook is a 62 y.o. female who presents for Medicare Annual (Subsequent) preventive examination.  Review of Systems     Cardiac Risk Factors include: diabetes mellitus;dyslipidemia;hypertension     Objective:    Today's Vitals   05/12/22 0846  BP: 126/74  Weight: 170 lb (77.1 kg)  Height: 5\' 3"  (1.6 m)   Body mass index is 30.11 kg/m.     06/15/2021   12:54 PM 05/03/2021    9:25 AM 12/21/2020    6:56 AM 03/17/2020    3:47 PM 02/24/2020    6:19 PM 02/14/2020    8:57 PM 02/13/2020    6:49 PM  Advanced Directives  Does Patient Have a Medical Advance Directive? No No No No No  No  Would patient like information on creating a medical advance directive? No - Patient declined No - Patient declined No - Patient declined No - Patient declined No - Patient declined No - Patient declined     Current Medications (verified) Outpatient Encounter Medications as of 05/12/2022  Medication Sig   albuterol (VENTOLIN HFA) 108 (90 Base) MCG/ACT inhaler Inhale 2 puffs into the lungs every 6 (six) hours as needed for wheezing.   amLODipine (NORVASC) 2.5 MG tablet TAKE 1 TABLET(2.5 MG) BY MOUTH DAILY   cetirizine (ZYRTEC) 10 MG tablet Take 10 mg by mouth daily.   cloNIDine (CATAPRES) 0.1 MG tablet Take 0.1 mg by mouth 2 (two) times daily as needed.   desvenlafaxine (PRISTIQ) 100 MG 24 hr tablet Take by mouth.   estradiol (ESTRACE) 2 MG tablet TAKE 1 TABLET(2 MG) BY MOUTH AT BEDTIME   fluticasone (FLOVENT HFA) 220 MCG/ACT inhaler Inhale 2 puffs into the lungs 2 (two) times daily. (Patient taking differently: Inhale 2 puffs into the lungs daily as needed (Shortness of breath).)   gabapentin (NEURONTIN) 400 MG capsule TAKE 1 CAPSULE(400 MG) BY MOUTH FOUR TIMES DAILY   Insulin Pen Needle (PEN NEEDLES) 32G X 4 MM MISC 32 g by Does not apply route daily.   lamoTRIgine (LAMICTAL) 100 MG tablet Take 100 mg by mouth 2 (two) times daily.    losartan (COZAAR) 100 MG tablet TAKE 1  TABLET(100 MG) BY MOUTH DAILY   Multiple Vitamin (MULTIVITAMIN WITH MINERALS) TABS tablet Take 1 tablet by mouth daily.   rosuvastatin (CRESTOR) 20 MG tablet TAKE 1 TABLET BY MOUTH EVERY DAY   Semaglutide, 1 MG/DOSE, 4 MG/3ML SOPN Inject 1 mg into the skin once a week.   Vitamin D, Ergocalciferol, (DRISDOL) 1.25 MG (50000 UNIT) CAPS capsule TAKE ONE CAPSULE BY MOUTH ONCE A WEEK   amoxicillin (AMOXIL) 500 MG capsule Take 2,000 mg by mouth See admin instructions. Take 4 capsules (2000 mg) by mouth 1 hour prior to dental appointment (Patient not taking: Reported on 05/12/2022)   [DISCONTINUED] metFORMIN (GLUCOPHAGE) 500 MG tablet Take 1 tablet by mouth once a day (Patient not taking: Reported on 12/26/2021)   No facility-administered encounter medications on file as of 05/12/2022.    Allergies (verified) Qvar [beclomethasone], Sulfa antibiotics, Tizanidine, Ace inhibitors, Hydrochlorothiazide, Prozac [fluoxetine hcl], Sulfonamide derivatives, Dyazide [hydrochlorothiazide w-triamterene], Erythromycin, Lipitor [atorvastatin calcium], Lisinopril, and Pravastatin   History: Past Medical History:  Diagnosis Date   Allergy    Anemia    PT HAS HAD COLONOSCOPY AND ENDOSCOPY WORK UP - NO PROBLEMS FOUND AS SOURCE OF ANEMIA -- PT HAD IRON INFUSION AT [facility] 11/13/12-AFTER SEEING HEMATOLOGIST DR. FORMANEK AND HE GAVE HEMATOLOGIC CLEARANCE FOR GASTRIC BYPASS SURGERY.  Anxiety    Asthma    daily and prn inhalers   COPD (chronic obstructive pulmonary disease)    Degenerative joint disease    right knee, spine - STATES INTERMITTENT  NUMBNESS DOWN RT LEG WITH PROLONGED STANDING OR WALKING- THINKS RELATED TO HER SPINE PROBLEMS   Dental crowns present    all permanent crowns   Depression    Diabetes mellitus, type 2    Family history of anesthesia complication    pt's mother and sister have hx. of post-op N/V   Fibroids    UTERINE   Gastroesophageal reflux disease    none for 2 years since Bariatric  surgery.   H/O hiatal hernia    History of endometriosis    History of kidney stones    passed stones   Hyperlipidemia    Hypertension    under control with med., has been on med. x 2 yr.   Lock jaw    jaw locks open if opens mouth wide   Migraines    Neuromuscular disorder    Neuropathy    bilateral feet   Obesity    Palpitations    no current problems   PONV (postoperative nausea and vomiting)    Shortness of breath    with daily activities, albuterol inhaler   Sleep apnea    does not use cpap   Ulcer    Past Surgical History:  Procedure Laterality Date   ABDOMINAL HYSTERECTOMY N/A 06/06/2017   Procedure: HYSTERECTOMY ABDOMINAL;  Surgeon: Lazaro Arms, MD;  Location: [facility];  Service: Gynecology;  Laterality: N/A;   BACK SURGERY     BREATH TEK H PYLORI N/A 08/13/2012   Procedure: BREATH TEK H PYLORI;  Surgeon: Mariella Saa, MD;  Location: [facility];  Service: General;  Laterality: N/A;   CARPAL TUNNEL RELEASE  01/03/2012   Procedure: CARPAL TUNNEL RELEASE RIGHT HAND;  Surgeon: Nicki Reaper, MD;  Location: [facility];  Service: Orthopedics;  Laterality: Right;   COLONOSCOPY  05/2009   [id number] hemorrhoids otherwise normal colon, rectum and terminal ileum   COLONOSCOPY WITH ESOPHAGOGASTRODUODENOSCOPY (EGD) N/A 10/28/2012   Procedure: COLONOSCOPY WITH ESOPHAGOGASTRODUODENOSCOPY (EGD);  Surgeon: Corbin Ade, MD;  [facility];  Service: Endoscopy;  Laterality: N/A;  7:30   DILITATION & CURRETTAGE/HYSTROSCOPY WITH NOVASURE ABLATION N/A 07/22/2014   Procedure: DILATATION & CURETTAGE/HYSTEROSCOPY WITH NOVASURE ABLATION; uterine length 6.0 cm; uterine width 3.8 cm; total ablation time 1 minute 15 seconds;  Surgeon: Lazaro Arms, MD;  Location: [facility];  Service: Gynecology;  Laterality: N/A;   ESOPHAGOGASTRODUODENOSCOPY  06/04/2009   [id number] esophagus/multiple polyps removed s/p (hyperplastic). Gastritis without H.pylori.    ESOPHAGOGASTRODUODENOSCOPY (EGD) WITH PROPOFOL N/A 06/06/2018   with LA Grade A esophagitis s/p dilation.    GASTRIC ROUX-EN-Y N/A 11/19/2012   Procedure: LAPAROSCOPIC ROUX-EN-Y GASTRIC;  Surgeon: Mariella Saa, MD;  Location: [facility];  Service: General;  Laterality: N/A;   GIVENS CAPSULE STUDY N/A 10/28/2012   Procedure: GIVENS CAPSULE STUDY;  Surgeon: Corbin Ade, MD;  [facility];  Service: Endoscopy;  Laterality: N/A;   JOINT REPLACEMENT     KNEE ARTHROSCOPY WITH MEDIAL MENISECTOMY Right 10/28/2015   Procedure: RIGHT KNEE ARTHROSCOPY WITH MEDIAL MENISECTOMY;  Surgeon: Vickki Hearing, MD;  Location: [facility];  Service: Orthopedics;  Laterality: Right;   LAPAROSCOPY     due to endometriosis   LUMBAR LAMINECTOMY/DECOMPRESSION MICRODISCECTOMY N/A 02/14/2020   Procedure: LUMBAR LAMINECTOMY  Lumbar three-four, Lumbar four-five;  Surgeon: Lisbeth Renshaw, MD;  Location[facility];  Service: Neurosurgery;  Laterality: N/A;   LUMBAR LAMINECTOMY/DECOMPRESSION MICRODISCECTOMY Right 12/21/2020   Procedure: Right open Lumbar four-five microdiscectomy;  Surgeon: Jadene Pierini, MD;  Location: [facility];  Service: Neurosurgery;  Laterality: Right;   MALONEY DILATION N/A 06/06/2018   Procedure: Elease Hashimoto DILATION;  Surgeon: Corbin Ade, MD;  [facility];  Service: Endoscopy;  Laterality: N/A;   PELVIC LAPAROSCOPY  11/04/1999   with fulguration of endometriosis   SALPINGOOPHORECTOMY Bilateral 06/06/2017   Procedure: SALPINGO OOPHORECTOMY;  Surgeon: Lazaro Arms, MD;  Location: [facility];  Service: Gynecology;  Laterality: Bilateral;   SMALL INTESTINE SURGERY     Gastric bypass surgery   SPINE SURGERY     TOTAL KNEE ARTHROPLASTY Right 07/22/2019   Procedure: TOTAL KNEE ARTHROPLASTY;  Surgeon: Vickki Hearing, MD;  Location: [facility];  Service: Orthopedics;  Laterality: Right;   TRIGGER FINGER RELEASE Right 09/24/2012   Procedure: RELEASE A-1 PULLEY OF RIGHT THUMB;   Surgeon: Nicki Reaper, MD;  Location: [facility];  Service: Orthopedics;  Laterality: Right;   TUBAL LIGATION  1993   Family History  Problem Relation Age of Onset   Hypertension Father    Hyperlipidemia Father    COPD Father    Arthritis Father    Heart disease Father    COPD Mother    Heart disease Mother    Cancer Mother        Cervix   Anesthesia problems Mother        post-op N/V   Thyroid disease Mother    Alcohol abuse Mother    Anxiety disorder Mother    Arthritis Mother    Asthma Mother    Depression Mother    Drug abuse Mother    Hypertension Mother    Diabetes Maternal Grandfather    Thyroid disease Sister    Anesthesia problems Sister        post-op N/V   ADD / ADHD Sister    Anxiety disorder Sister    Arthritis Sister    Depression Sister    Hypertension Sister    Obesity Sister    Varicose Veins Sister    Thyroid disease Paternal Uncle    Diabetes Other    Arthritis Maternal Grandmother    COPD Maternal Grandmother    Depression Maternal Grandmother    Arthritis Paternal Grandfather    Stroke Paternal Grandfather    Arthritis Paternal Grandmother    Diabetes Paternal Grandmother    Hypertension Paternal Grandmother    Obesity Paternal Grandmother    Depression Paternal Grandmother    ADD / ADHD Brother    Asthma Brother    ADD / ADHD Brother    Asthma Brother    Hypertension Brother    Obesity Brother    Anxiety disorder Sister    Colon cancer Neg Hx    Colon polyps Neg Hx    Social History   Socioeconomic History   Marital status: Married    Spouse name: Molly Maduro   Number of children: 1   Years of education: Not on file   Highest education level: 12th grade  Occupational History   Occupation: disability  Tobacco Use   Smoking status: Never   Smokeless tobacco: Never  Vaping Use   Vaping Use: Never used  Substance and Sexual Activity   Alcohol use: No   Drug use: No   Sexual activity:  Not Currently    Birth  control/protection: Surgical    Comment: Hysterectomy  Other Topics Concern   Not on file  Social History Narrative   Not on file   Social Determinants of Health   Financial Resource Strain: Low Risk  (05/12/2022)   Overall Financial Resource Strain (CARDIA)    Difficulty of Paying Living Expenses: Not hard at all  Food Insecurity: No Food Insecurity (05/12/2022)   Hunger Vital Sign    Worried About Running Out of Food in the Last Year: Never true    Ran Out of Food in the Last Year: Never true  Transportation Needs: No Transportation Needs (05/12/2022)   PRAPARE - Administrator, Civil Service (Medical): No    Lack of Transportation (Non-Medical): No  Physical Activity: Inactive (05/12/2022)   Exercise Vital Sign    Days of Exercise per Week: 0 days    Minutes of Exercise per Session: 0 min  Stress: Stress Concern Present (05/12/2022)   [employer] of Occupational Health - Occupational Stress Questionnaire    Feeling of Stress : Rather much  Social Connections: Moderately Integrated (05/12/2022)   Social Connection and Isolation Panel [NHANES]    Frequency of Communication with Friends and Family: More than three times a week    Frequency of Social Gatherings with Friends and Family: More than three times a week    Attends Religious Services: More than 4 times per year    Active Member of [employer] or Organizations: No    Attends Engineer, structural: Never    Marital Status: Married    Tobacco Counseling Counseling given: Not Answered   Clinical Intake:  Pre-visit preparation completed: Yes  Pain : No/denies pain     Diabetes: Yes CBG done?: No Did pt. bring in CBG monitor from home?: No  How often do you need to have someone help you when you read instructions, pamphlets, or other written materials from your doctor or pharmacy?: 2 - Rarely  Diabetic?Yes   Nutrition Risk Assessment:  Has the patient had any N/V/D within the last 2 months?   No  Does the patient have any non-healing wounds?  No  Has the patient had any unintentional weight loss or weight gain?  No   Diabetes:  Is the patient diabetic?  Yes  If diabetic, was a CBG obtained today?  No  Did the patient bring in their glucometer from home?  No  How often do you monitor your CBG's? daily.   Financial Strains and Diabetes Management:  Are you having any financial strains with the device, your supplies or your medication?  Receiving assistance through manufacturer program  .  Does the patient want to be seen by Chronic Care Management for management of their diabetes?  No  Would the patient like to be referred to a Nutritionist or for Diabetic Management?  No   Diabetic Exams:  Diabetic Eye Exam: Overdue for diabetic eye exam. Pt has been advised about the importance in completing this exam. Patient advised to call and schedule an eye exam. Diabetic Foot Exam: Completed 07/19/21   Interpreter Needed?: No  Information entered by :: Kandis Fantasia LPN   Activities of Daily Living    05/12/2022    9:01 AM 05/09/2022    6:39 PM  In your present state of health, do you have any difficulty performing the following activities:  Hearing? 0 0  Vision? 1 1  Difficulty concentrating or making decisions? 1 1  Walking or climbing stairs? 1 1  Dressing or bathing? 0 0  Doing errands, shopping? 0 0  Preparing Food and eating ? N N  Using the Toilet? N N  In the past six months, have you accidently leaked urine? Y Y  Do you have problems with loss of bowel control? N N  Managing your Medications? N N  Managing your Finances? N Y  Housekeeping or managing your Housekeeping? Malvin Johns    Patient Care Team: Babs Sciara, MD as PCP - General (Family Medicine) Rothbart, Gerrit Friends, MD (Inactive) (Cardiology) Himmelrich, Loree Fee, RD (Inactive) as Dietitian (Bariatrics) Delano Metz, RN (Inactive) as Registered Nurse  Indicate any recent Medical Services you may have  received from other than [facility] providers in the past year (date may be approximate).     Assessment:   This is a routine wellness examination for Stephinie.  Hearing/Vision screen Hearing Screening - Comments:: Denies hearing difficulties   Vision Screening - Comments:: No vision problems; will schedule routine eye exam soon    Dietary issues and exercise activities discussed: Current Exercise Habits: The patient does not participate in regular exercise at present   Goals Addressed   None    Depression Screen    05/12/2022    8:57 AM 12/26/2021    1:14 PM 11/18/2021    1:54 PM 06/15/2021   12:46 PM 05/03/2021    9:13 AM 03/03/2021   11:09 AM 06/15/2020   11:24 AM  PHQ 2/9 Scores  PHQ - 2 Score 4 4 6 2 1  0 0  PHQ- 9 Score 15 20 27 7        Fall Risk    05/12/2022    9:01 AM 05/09/2022    6:39 PM 12/26/2021    1:14 PM 11/18/2021    1:53 PM 06/15/2021    1:04 PM  Fall Risk   Falls in the past year? 1 1 1 1 1   Number falls in past yr: 0 0 0 1 1  Injury with Fall? 0 0 0 0 0  Risk for fall due to : History of fall(s)   Impaired balance/gait   Follow up Falls evaluation completed;Education provided;Falls prevention discussed   Falls evaluation completed;Education provided;Falls prevention discussed     FALL RISK PREVENTION PERTAINING TO THE HOME:  Any stairs in or around the home? No  If so, are there any without handrails? No  Home free of loose throw rugs in walkways, pet beds, electrical cords, etc? Yes  Adequate lighting in your home to reduce risk of falls? Yes   ASSISTIVE DEVICES UTILIZED TO PREVENT FALLS:  Life alert? No  Use of a cane, walker or w/c? No  Grab bars in the bathroom? Yes  Shower chair or bench in shower? No  Elevated toilet seat or a handicapped toilet? Yes   TIMED UP AND GO:  Was the test performed? Yes .  Length of time to ambulate 10 feet: 6 sec.   Gait steady and fast without use of assistive device  Cognitive Function:        05/12/2022     9:02 AM 05/03/2021    9:31 AM  6CIT Screen  What Year? 0 points 0 points  What month? 0 points 0 points  What time? 0 points 0 points  Count back from 20 0 points 0 points  Months in reverse 0 points 0 points  Repeat phrase 0 points 0 points  Total Score 0 points 0 points  Immunizations Immunization History  Administered Date(s) Administered   Influenza Split 12/10/2012   Influenza,inj,Quad PF,6+ Mos 12/11/2013, 01/06/2015, 10/06/2019, 11/22/2020, 11/18/2021   Moderna Sars-Covid-2 Vaccination 04/24/2019, 05/27/2019   PNEUMOCOCCAL CONJUGATE-20 11/22/2020   Pneumococcal Polysaccharide-23 12/11/2013    TDAP status: Due, Education has been provided regarding the importance of this vaccine. Advised may receive this vaccine at local pharmacy or Health Dept. Aware to provide a copy of the vaccination record if obtained from local pharmacy or Health Dept. Verbalized acceptance and understanding.  Flu Vaccine status: Up to date  Pneumococcal vaccine status: Up to date  Covid-19 vaccine status: Information provided on how to obtain vaccines.   Qualifies for Shingles Vaccine? Yes   Zostavax completed No   Shingrix Completed?: No.    Education has been provided regarding the importance of this vaccine. Patient has been advised to call insurance company to determine out of pocket expense if they have not yet received this vaccine. Advised may also receive vaccine at local pharmacy or Health Dept. Verbalized acceptance and understanding.  Screening Tests Health Maintenance  Topic Date Due   DTaP/Tdap/Td (1 - Tdap) Never done   Zoster Vaccines- Shingrix (1 of 2) Never done   OPHTHALMOLOGY EXAM  12/27/2014   PAP SMEAR-Modifier  07/16/2015   COVID-19 Vaccine (3 - Moderna risk series) 06/24/2019   FOOT EXAM  07/20/2022   INFLUENZA VACCINE  08/24/2022   HEMOGLOBIN A1C  10/25/2022   COLONOSCOPY (Pts 45-39yrs Insurance coverage will need to be confirmed)  10/29/2022   Diabetic kidney  evaluation - eGFR measurement  04/25/2023   Diabetic kidney evaluation - Urine ACR  04/25/2023   Medicare Annual Wellness (AWV)  05/12/2023   MAMMOGRAM  08/02/2023   Hepatitis C Screening  Completed   HIV Screening  Completed   HPV VACCINES  Aged Out    Health Maintenance  Health Maintenance Due  Topic Date Due   DTaP/Tdap/Td (1 - Tdap) Never done   Zoster Vaccines- Shingrix (1 of 2) Never done   OPHTHALMOLOGY EXAM  12/27/2014   PAP SMEAR-Modifier  07/16/2015   COVID-19 Vaccine (3 - Moderna risk series) 06/24/2019    Colorectal cancer screening: Type of screening: Colonoscopy. Completed 10/28/12. Repeat every 10 years  Mammogram status: Completed 08/01/21. Repeat every year  Lung Cancer Screening: (Low Dose CT Chest recommended if Age 43-80 years, 30 pack-year currently smoking OR have quit w/in 15years.) does not qualify.   Lung Cancer Screening Referral: n/a  Additional Screening:  Hepatitis C Screening: does qualify; Completed 08/08/16  Vision Screening: Recommended annual ophthalmology exams for early detection of glaucoma and other disorders of the eye. Is the patient up to date with their annual eye exam?  No  Who is the provider or what is the name of the office in which the patient attends annual eye exams? none If pt is not established with a provider, would they like to be referred to a provider to establish care? No .   Dental Screening: Recommended annual dental exams for proper oral hygiene  Community Resource Referral / Chronic Care Management: CRR required this visit?  No   CCM required this visit?  No      Plan:     I have personally reviewed and noted the following in the patient's chart:   Medical and social history Use of alcohol, tobacco or illicit drugs  Current medications and supplements including opioid prescriptions. Patient is not currently taking opioid prescriptions. Functional ability and status Nutritional status Physical  activity Advanced directives List of other physicians Hospitalizations, surgeries, and ER visits in previous 12 months Vitals Screenings to include cognitive, depression, and falls Referrals and appointments  In addition, I have reviewed and discussed with patient certain preventive protocols, quality metrics, and best practice recommendations. A written personalized care plan for preventive services as well as general preventive health recommendations were provided to patient.     Kandis Fantasia [place], [place]   1/32/4401   Nurse Notes: No concerns

## 2022-05-12 ENCOUNTER — Ambulatory Visit (INDEPENDENT_AMBULATORY_CARE_PROVIDER_SITE_OTHER): Payer: Medicare Other

## 2022-05-12 VITALS — BP 126/74 | Ht 63.0 in | Wt 170.0 lb

## 2022-05-12 DIAGNOSIS — Z0000 Encounter for general adult medical examination without abnormal findings: Secondary | ICD-10-CM

## 2022-05-17 ENCOUNTER — Ambulatory Visit (INDEPENDENT_AMBULATORY_CARE_PROVIDER_SITE_OTHER): Payer: Medicare Other | Admitting: Family Medicine

## 2022-05-17 ENCOUNTER — Encounter: Payer: Self-pay | Admitting: Family Medicine

## 2022-05-17 VITALS — BP 128/76 | HR 86 | Ht 63.0 in | Wt 169.8 lb

## 2022-05-17 DIAGNOSIS — Z794 Long term (current) use of insulin: Secondary | ICD-10-CM | POA: Diagnosis not present

## 2022-05-17 DIAGNOSIS — E785 Hyperlipidemia, unspecified: Secondary | ICD-10-CM

## 2022-05-17 DIAGNOSIS — E119 Type 2 diabetes mellitus without complications: Secondary | ICD-10-CM

## 2022-05-17 DIAGNOSIS — E1169 Type 2 diabetes mellitus with other specified complication: Secondary | ICD-10-CM

## 2022-05-17 DIAGNOSIS — F322 Major depressive disorder, single episode, severe without psychotic features: Secondary | ICD-10-CM | POA: Diagnosis not present

## 2022-05-17 DIAGNOSIS — I10 Essential (primary) hypertension: Secondary | ICD-10-CM | POA: Diagnosis not present

## 2022-05-17 NOTE — Progress Notes (Signed)
   Subjective:    Patient ID: Carrie Cook, female    DOB: Feb 01, 1960, 62 y.o.   MRN: [id number]  HPI Patient arrives today for 4 month follow up and to go over lab work.  Results for orders placed or performed in visit on 12/26/21  Hemoglobin A1c  Result Value Ref Range   Hgb A1c MFr Bld 6.6 (H) 4.8 - 5.6 %   Est. average glucose Bld gHb Est-mCnc 143 mg/dL  Basic metabolic panel  Result Value Ref Range   Glucose 132 (H) 70 - 99 mg/dL   BUN 11 8 - 27 mg/dL   Creatinine, Ser 4.09 0.57 - 1.00 mg/dL   eGFR 77 >81 XB/JYN/8.29   BUN/Creatinine Ratio 13 12 - 28   Sodium 139 134 - 144 mmol/L   Potassium 4.8 3.5 - 5.2 mmol/L   Chloride 101 96 - 106 mmol/L   CO2 22 20 - 29 mmol/L   Calcium 9.4 8.7 - 10.3 mg/dL  Lipid panel  Result Value Ref Range   Cholesterol, Total 153 100 - 199 mg/dL   Triglycerides 562 (H) 0 - 149 mg/dL   HDL 47 >13 mg/dL   VLDL Cholesterol Cal 29 5 - 40 mg/dL   LDL Chol Calc (NIH) 77 0 - 99 mg/dL   Chol/HDL Ratio 3.3 0.0 - 4.4 ratio  Microalbumin / creatinine urine ratio  Result Value Ref Range   Creatinine, Urine 254.3 Not Estab. mg/dL   Microalbumin, Urine 08.6 Not Estab. ug/mL   Microalb/Creat Ratio 12 0 - 29 mg/g creat   Primary hypertension  Hyperlipidemia associated with type 2 diabetes mellitus  Type 2 diabetes mellitus without complication, with long-term current use of insulin  Depression, major, single episode, severe  Medications were reviewed Patient here for follow-up regarding cholesterol.    Patient relates taking medication on a regular basis Denies problems with medication Importance of dietary measures discussed Regular lab work regarding lipid and liver was checked and if needing additional labs was appropriately ordered  The patient was seen today as part of a comprehensive diabetic check up. Patient has diabetes Patient relates good compliance with taking the medication. We discussed their diet and exercise activities  We  also discussed the importance of notifying us if any excessively high glucoses or low sugars.  Patient for blood pressure check up.  The patient does have hypertension.   Patient relates dietary measures try to minimize salt The importance of healthy diet and activity were discussed Patient relates compliance   .    Review of Systems     Objective:   Physical Exam  General-in no acute distress Eyes-no discharge Lungs-respiratory rate normal, CTA CV-no murmurs,RRR Extremities skin warm dry no edema Neuro grossly normal Behavior normal, alert       Assessment & Plan:   1. Primary hypertension Blood pressure good control continue current measures  2. Hyperlipidemia associated with type 2 diabetes mellitus Continue medication healthy diet  3. Type 2 diabetes mellitus without complication, with long-term current use of insulin Currently doing well on the medicine given her history of gastric bypass and morbid obesity and diabetes I believe she is doing well  4. Depression, major, single episode, severe Under the care of specialist continue these current measures  Follow-up by fall time

## 2022-05-22 IMAGING — DX DG KNEE 1-2V PORT*R*
2 series · 2 of 2 positions shown · non-contrast
Comparison: Right knee series 01/13/2019.

CLINICAL DATA: 59-year-old female status post right knee
replacement.

EXAM:
PORTABLE RIGHT KNEE - 1-2 VIEW

[knee ap]
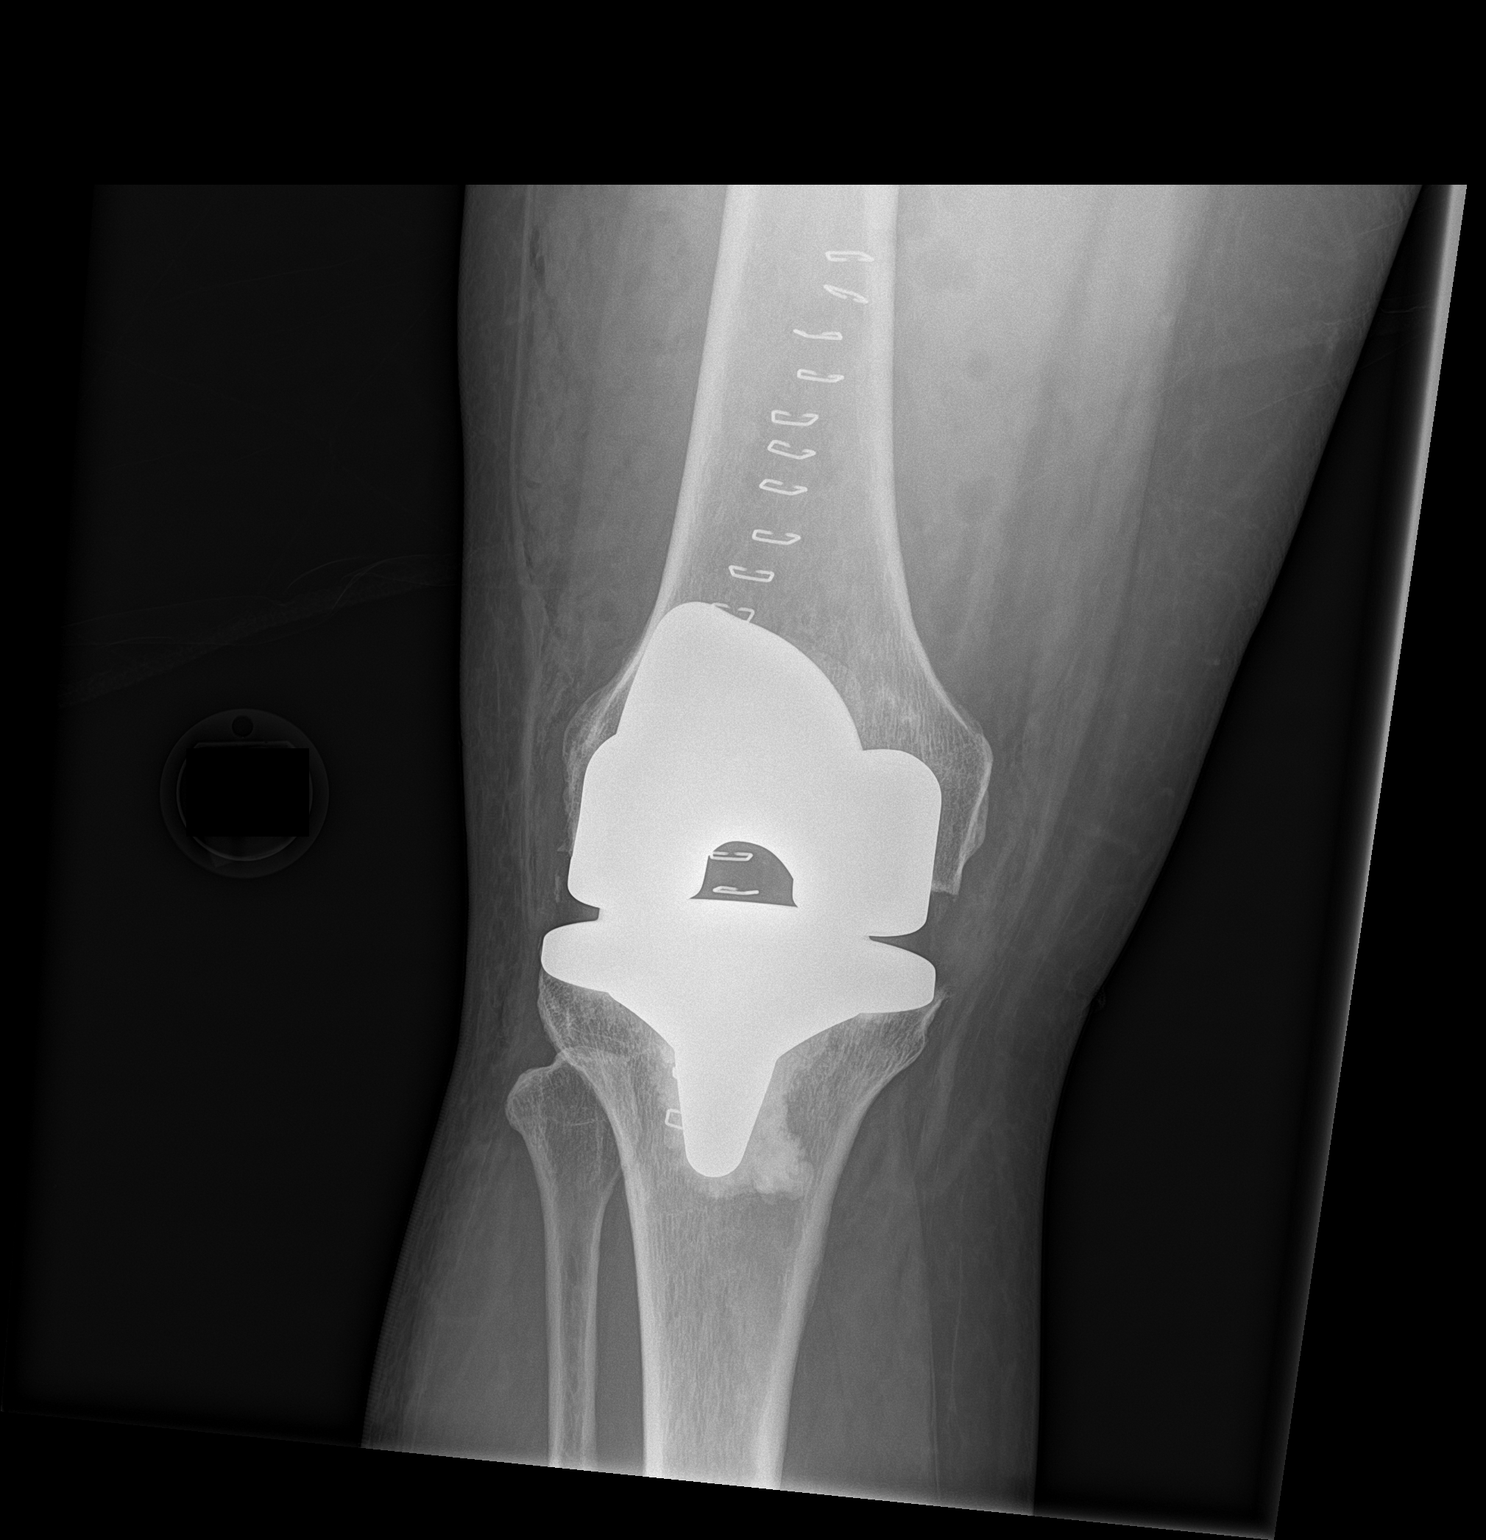

[knee lat]
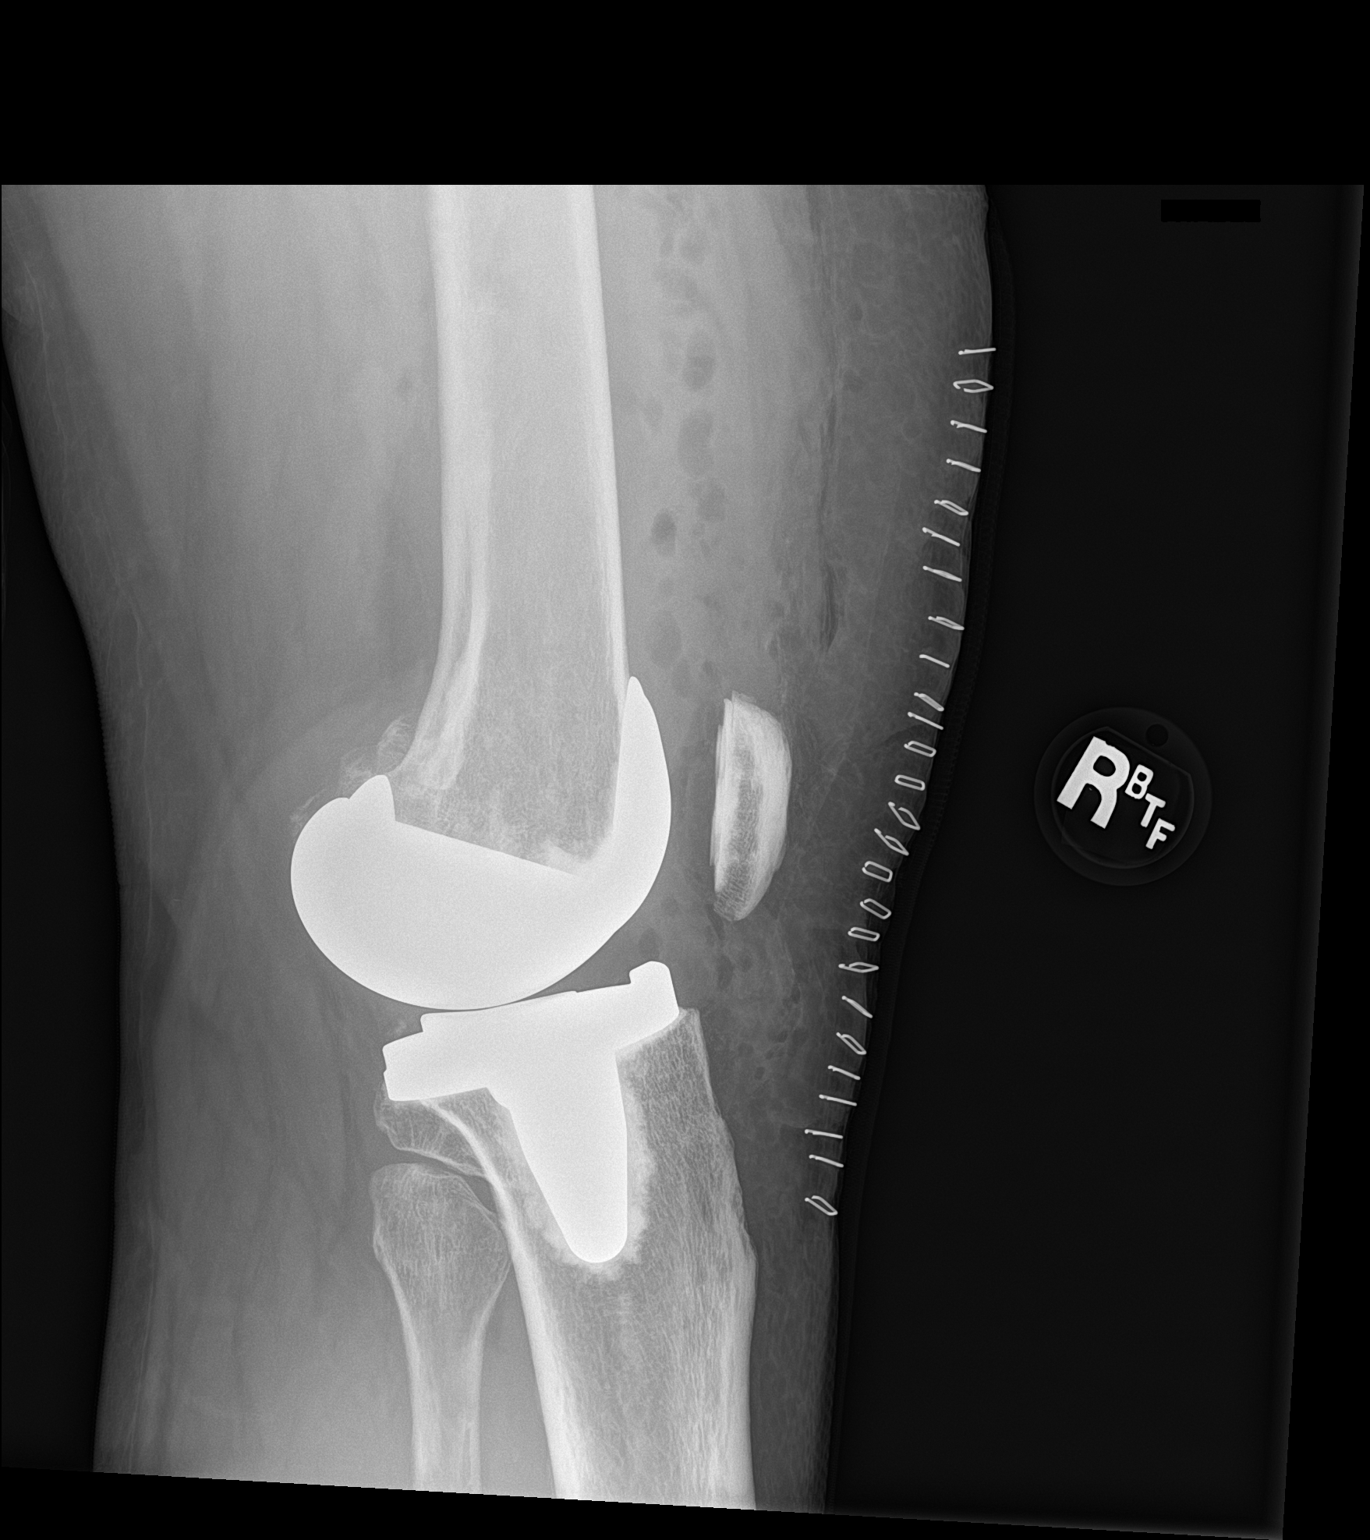

[2 of 2 positions shown; findings below may reference images not displayed]

FINDINGS: Portable AP and cross-table lateral views. Sequelae of right total
knee arthroplasty with intact hardware. Alignment appears
satisfactory. Postoperative changes to the patella also. No
unexpected osseous changes. Postoperative soft tissue gas. Anterior
skin staples.
IMPRESSION: Right total knee arthroplasty with no adverse features.

## 2022-07-05 DIAGNOSIS — F339 Major depressive disorder, recurrent, unspecified: Secondary | ICD-10-CM | POA: Diagnosis not present

## 2022-07-21 DIAGNOSIS — H2513 Age-related nuclear cataract, bilateral: Secondary | ICD-10-CM | POA: Diagnosis not present

## 2022-07-21 DIAGNOSIS — H524 Presbyopia: Secondary | ICD-10-CM | POA: Diagnosis not present

## 2022-07-21 DIAGNOSIS — H5203 Hypermetropia, bilateral: Secondary | ICD-10-CM | POA: Diagnosis not present

## 2022-07-21 DIAGNOSIS — E119 Type 2 diabetes mellitus without complications: Secondary | ICD-10-CM | POA: Diagnosis not present

## 2022-07-21 DIAGNOSIS — H52223 Regular astigmatism, bilateral: Secondary | ICD-10-CM | POA: Diagnosis not present

## 2022-08-01 ENCOUNTER — Other Ambulatory Visit: Payer: Self-pay | Admitting: Family Medicine

## 2022-08-08 ENCOUNTER — Telehealth: Payer: Self-pay

## 2022-08-08 ENCOUNTER — Other Ambulatory Visit: Payer: Self-pay | Admitting: Family Medicine

## 2022-08-08 DIAGNOSIS — M5116 Intervertebral disc disorders with radiculopathy, lumbar region: Secondary | ICD-10-CM

## 2022-08-08 MED ORDER — GABAPENTIN 400 MG PO CAPS: ORAL_CAPSULE | ORAL | 5 refills | Status: DC

## 2022-08-08 NOTE — Telephone Encounter (Signed)
Refill was sent you may send her a MyChart message thanks

## 2022-08-08 NOTE — Telephone Encounter (Signed)
Prescription Request  08/08/2022  LOV: Visit date not found  What is the name of the medication or equipment? gabapentin (NEURONTIN) 400 MG capsule   Have you contacted your pharmacy to request a refill? Yes   Which pharmacy would you like this sent to?  [facility] [phone] - [place], [place] - [address] AT [facility] [address] [place] [address] Phone: [phone] Fax: [phone]    Patient notified that their request is being sent to the clinical staff for review and that they should receive a response within 2 business days.   Please advise at Mobile [phone] (mobile)

## 2022-10-10 ENCOUNTER — Encounter (HOSPITAL_COMMUNITY): Payer: Self-pay | Admitting: Hematology and Oncology

## 2022-10-10 ENCOUNTER — Telehealth: Payer: Self-pay | Admitting: Family Medicine

## 2022-10-10 NOTE — Telephone Encounter (Signed)
Lipid, liver, metabolic 7, A1c, vitamin D  Hyperlipidemia, type 2 diabetes, vitamin D deficiency

## 2022-10-10 NOTE — Telephone Encounter (Signed)
Patient is requesting labs for this week ,has follow up next week

## 2022-10-12 ENCOUNTER — Other Ambulatory Visit: Payer: Self-pay

## 2022-10-12 DIAGNOSIS — I10 Essential (primary) hypertension: Secondary | ICD-10-CM

## 2022-10-12 DIAGNOSIS — E559 Vitamin D deficiency, unspecified: Secondary | ICD-10-CM

## 2022-10-12 DIAGNOSIS — E119 Type 2 diabetes mellitus without complications: Secondary | ICD-10-CM

## 2022-10-12 DIAGNOSIS — E1169 Type 2 diabetes mellitus with other specified complication: Secondary | ICD-10-CM

## 2022-10-12 DIAGNOSIS — E785 Hyperlipidemia, unspecified: Secondary | ICD-10-CM

## 2022-10-12 DIAGNOSIS — Z79899 Other long term (current) drug therapy: Secondary | ICD-10-CM

## 2022-10-12 NOTE — Telephone Encounter (Signed)
Called pt and informed of the lab orders have been ordered and get those done before next visit

## 2022-10-13 DIAGNOSIS — Z794 Long term (current) use of insulin: Secondary | ICD-10-CM | POA: Diagnosis not present

## 2022-10-13 DIAGNOSIS — E785 Hyperlipidemia, unspecified: Secondary | ICD-10-CM | POA: Diagnosis not present

## 2022-10-13 DIAGNOSIS — Z79899 Other long term (current) drug therapy: Secondary | ICD-10-CM | POA: Diagnosis not present

## 2022-10-13 DIAGNOSIS — E559 Vitamin D deficiency, unspecified: Secondary | ICD-10-CM | POA: Diagnosis not present

## 2022-10-13 DIAGNOSIS — E1169 Type 2 diabetes mellitus with other specified complication: Secondary | ICD-10-CM | POA: Diagnosis not present

## 2022-10-13 DIAGNOSIS — I10 Essential (primary) hypertension: Secondary | ICD-10-CM | POA: Diagnosis not present

## 2022-10-13 DIAGNOSIS — E119 Type 2 diabetes mellitus without complications: Secondary | ICD-10-CM | POA: Diagnosis not present

## 2022-10-14 ENCOUNTER — Other Ambulatory Visit: Payer: Self-pay | Admitting: Family Medicine

## 2022-10-14 LAB — BASIC METABOLIC PANEL
BUN/Creatinine Ratio: 9 — ABNORMAL LOW (ref 12–28)
BUN: 7 mg/dL — ABNORMAL LOW (ref 8–27)
CO2: 24 mmol/L (ref 20–29)
Calcium: 9 mg/dL (ref 8.7–10.3)
Chloride: 102 mmol/L (ref 96–106)
Creatinine, Ser: 0.75 mg/dL (ref 0.57–1.00)
Glucose: 140 mg/dL — ABNORMAL HIGH (ref 70–99)
Potassium: 4.7 mmol/L (ref 3.5–5.2)
Sodium: 139 mmol/L (ref 134–144)
eGFR: 91 mL/min/{1.73_m2} (ref >59)

## 2022-10-14 LAB — LIPID PANEL
Chol/HDL Ratio: 2.7 ratio (ref 0.0–4.4)
Cholesterol, Total: 151 mg/dL (ref 100–199)
HDL: 56 mg/dL (ref >39)
LDL Chol Calc (NIH): 74 mg/dL (ref 0–99)
Triglycerides: 116 mg/dL (ref 0–149)
VLDL Cholesterol Cal: 21 mg/dL (ref 5–40)

## 2022-10-14 LAB — HEPATIC FUNCTION PANEL
ALT: 43 IU/L — ABNORMAL HIGH (ref 0–32)
AST: 35 IU/L (ref 0–40)
Albumin: 4.4 g/dL (ref 3.8–4.9)
Alkaline Phosphatase: 118 IU/L (ref 44–121)
Bilirubin Total: 0.4 mg/dL (ref 0.0–1.2)
Bilirubin, Direct: 0.12 mg/dL (ref 0.00–0.40)
Total Protein: 6.3 g/dL (ref 6.0–8.5)

## 2022-10-14 LAB — HEMOGLOBIN A1C
Est. average glucose Bld gHb Est-mCnc: 146 mg/dL
Hgb A1c MFr Bld: 6.7 % — ABNORMAL HIGH (ref 4.8–5.6)

## 2022-10-14 LAB — VITAMIN D 25 HYDROXY (VIT D DEFICIENCY, FRACTURES): Vit D, 25-Hydroxy: 85.3 ng/mL (ref 30.0–100.0)

## 2022-10-17 ENCOUNTER — Ambulatory Visit: Payer: Medicare Other | Admitting: Family Medicine

## 2022-10-17 VITALS — BP 111/77 | HR 92 | Temp 97.7°F | Ht 63.0 in | Wt 169.0 lb

## 2022-10-17 DIAGNOSIS — Z794 Long term (current) use of insulin: Secondary | ICD-10-CM

## 2022-10-17 DIAGNOSIS — M5116 Intervertebral disc disorders with radiculopathy, lumbar region: Secondary | ICD-10-CM | POA: Diagnosis not present

## 2022-10-17 DIAGNOSIS — E1169 Type 2 diabetes mellitus with other specified complication: Secondary | ICD-10-CM

## 2022-10-17 DIAGNOSIS — Z23 Encounter for immunization: Secondary | ICD-10-CM | POA: Diagnosis not present

## 2022-10-17 DIAGNOSIS — E785 Hyperlipidemia, unspecified: Secondary | ICD-10-CM

## 2022-10-17 DIAGNOSIS — I10 Essential (primary) hypertension: Secondary | ICD-10-CM | POA: Diagnosis not present

## 2022-10-17 DIAGNOSIS — E119 Type 2 diabetes mellitus without complications: Secondary | ICD-10-CM

## 2022-10-17 MED ORDER — GABAPENTIN 400 MG PO CAPS
ORAL_CAPSULE | ORAL | 5 refills | Status: DC
Start: 2022-10-17 — End: 2023-07-25

## 2022-10-17 MED ORDER — AMLODIPINE BESYLATE 2.5 MG PO TABS: ORAL_TABLET | ORAL | 1 refills | Status: DC

## 2022-10-17 MED ORDER — ROSUVASTATIN CALCIUM 20 MG PO TABS: ORAL_TABLET | ORAL | 1 refills | Status: DC

## 2022-10-17 NOTE — Progress Notes (Signed)
Subjective:    Patient ID: Carrie Cook, female    DOB: 18-Sep-1960, 62 y.o.   MRN: [id number]  Diabetes She presents for her follow-up diabetic visit. She has type 2 diabetes mellitus. Current diabetic treatments: semaglutide.   Htn, depression, hyperlipidemia  Discuss lab results Results for orders placed or performed in visit on 10/12/22  Lipid Panel  Result Value Ref Range   Cholesterol, Total 151 100 - 199 mg/dL   Triglycerides 409 0 - 149 mg/dL   HDL 56 >81 mg/dL   VLDL Cholesterol Cal 21 5 - 40 mg/dL   LDL Chol Calc (NIH) 74 0 - 99 mg/dL   Chol/HDL Ratio 2.7 0.0 - 4.4 ratio  Hepatic Function Panel  Result Value Ref Range   Total Protein 6.3 6.0 - 8.5 g/dL   Albumin 4.4 3.8 - 4.9 g/dL   Bilirubin Total 0.4 0.0 - 1.2 mg/dL   Bilirubin, Direct 1.91 0.00 - 0.40 mg/dL   Alkaline Phosphatase 118 44 - 121 IU/L   AST 35 0 - 40 IU/L   ALT 43 (H) 0 - 32 IU/L  Basic Metabolic Panel  Result Value Ref Range   Glucose 140 (H) 70 - 99 mg/dL   BUN 7 (L) 8 - 27 mg/dL   Creatinine, Ser 4.78 0.57 - 1.00 mg/dL   eGFR 91 >29 FA/OZH/0.86   BUN/Creatinine Ratio 9 (L) 12 - 28   Sodium 139 134 - 144 mmol/L   Potassium 4.7 3.5 - 5.2 mmol/L   Chloride 102 96 - 106 mmol/L   CO2 24 20 - 29 mmol/L   Calcium 9.0 8.7 - 10.3 mg/dL  Hemoglobin V7Q  Result Value Ref Range   Hgb A1c MFr Bld 6.7 (H) 4.8 - 5.6 %   Est. average glucose Bld gHb Est-mCnc 146 mg/dL  Vitamin D, 46-NGEXBMW  Result Value Ref Range   Vit D, 25-Hydroxy 85.3 30.0 - 100.0 ng/mL   Immunization due - Plan: Flu vaccine trivalent PF, 6mos and older(Flulaval,Afluria,Fluarix,Fluzone)  Essential hypertension  Type 2 diabetes mellitus without complication, with long-term current use of insulin (HCC)  Hyperlipidemia associated with type 2 diabetes mellitus (HCC)  Intervertebral disc disorders with radiculopathy, lumbar region - Plan: gabapentin (NEURONTIN) 400 MG capsule  Patient for blood pressure check up.  The patient  does have hypertension.   Patient relates dietary measures try to minimize salt The importance of healthy diet and activity were discussed Patient relates compliance  The patient was seen today as part of a comprehensive diabetic check up. Patient has diabetes Patient relates good compliance with taking the medication. We discussed their diet and exercise activities  We also discussed the importance of notifying us if any excessively high glucoses or low sugars.    Patient here for follow-up regarding cholesterol.    Patient relates taking medication on a regular basis Denies problems with medication Importance of dietary measures discussed Regular lab work regarding lipid and liver was checked and if needing additional labs was appropriately ordered  Patient under the care of psychiatry for mental health issues things are going fair   Review of Systems     Objective:   Physical Exam General-in no acute distress Eyes-no discharge Lungs-respiratory rate normal, CTA CV-no murmurs,RRR Extremities skin warm dry no edema Neuro grossly normal Behavior normal, alert        Assessment & Plan:  1. Immunization due Today - Flu vaccine trivalent PF, 6mos and older(Flulaval,Afluria,Fluarix,Fluzone)  2. Essential hypertension HTN- patient seen for  follow-up regarding HTN.   Diet, medication compliance, appropriate labs and refills were completed.   Importance of keeping blood pressure under good control to lessen the risk of complications discussed Regular follow-up visits discussed   3. Type 2 diabetes mellitus without complication, with long-term current use of insulin (HCC) The patient was seen today as part of a comprehensive visit for diabetes. The importance of keeping her A1c at or below 7 range was discussed.  Discussed diet, activity, and medication compliance Emphasized healthy eating primarily with vegetables fruits and if utilizing meats lean meats such as  chicken or fish grilled baked broiled Avoid sugary drinks Minimize and avoid processed foods Fit in regular physical activity preferably 25 to 30 minutes 4 times per week Standard follow-up visit recommended.  Patient aware lack of control and follow-up increases risk of diabetic complications. Regular follow-up visits Yearly ophthalmology Yearly foot exam   4. Hyperlipidemia associated with type 2 diabetes mellitus (HCC) Hyperlipidemia-importance of diet, weight control, activity, compliance with medications discussed.   Recent labs reviewed.   Any additional labs or refills ordered.   Importance of keeping under good control discussed. Regular follow-up visits discussed   5. Intervertebral disc disorders with radiculopathy, lumbar region Gabapentin seems to be helping not causing drowsiness - gabapentin (NEURONTIN) 400 MG capsule; 1 qam 1 mid day and 2 qhs  Dispense: 120 capsule; Refill: 5  Follow-up 4 to 6 months

## 2022-10-25 DIAGNOSIS — F339 Major depressive disorder, recurrent, unspecified: Secondary | ICD-10-CM | POA: Diagnosis not present

## 2022-11-07 ENCOUNTER — Encounter: Payer: Self-pay | Admitting: Family Medicine

## 2022-11-07 ENCOUNTER — Other Ambulatory Visit: Payer: Self-pay | Admitting: Family Medicine

## 2022-11-07 MED ORDER — SEMAGLUTIDE (2 MG/DOSE) 8 MG/3ML ~~LOC~~ SOPN: 2.0000 mg | PEN_INJECTOR | SUBCUTANEOUS | Status: DC

## 2022-11-29 DIAGNOSIS — F339 Major depressive disorder, recurrent, unspecified: Secondary | ICD-10-CM | POA: Diagnosis not present

## 2022-12-20 DIAGNOSIS — F339 Major depressive disorder, recurrent, unspecified: Secondary | ICD-10-CM | POA: Diagnosis not present

## 2022-12-25 ENCOUNTER — Telehealth: Payer: Self-pay | Admitting: *Deleted

## 2022-12-25 NOTE — Telephone Encounter (Signed)
Copied from CRM [id number]. Topic: Clinical - Medication Refill >> Dec 25, 2022 10:19 AM Thomes Dinning wrote: Most Recent Primary Care Visit:  Provider: Lilyan Punt A  Department: [facility]  Visit Type: OFFICE VISIT  Date: 10/17/2022  Medication:  Ozempic  Has the patient contacted their pharmacy? No (Agent: If no, request that the patient contact the pharmacy for the refill. If patient does not wish to contact the pharmacy document the reason why and proceed with request.) (Agent: If yes, when and what did the pharmacy advise?)  Is this the correct pharmacy for this prescription? Yes If no, delete pharmacy and type the correct one.  This is the patient's preferred pharmacy:  [facility] [phone] - [place], [place] - [address] AT [facility] OF [address]  [place] [address] Phone: [phone] Fax: [phone]   Has the prescription been filled recently? No  Is the patient out of the medication? No, she has 1 remaining dose  Has the patient been seen for an appointment in the last year OR does the patient have an upcoming appointment? Yes  Can we respond through MyChart? Yes  Agent: Please be advised that Rx refills may take up to 3 business days. We ask that you follow-up with your pharmacy.

## 2022-12-25 NOTE — Telephone Encounter (Signed)
May have for refills Needs follow-up visit by March Under orders only please order A1c, metabolic 7, lipid, liver, urine ACR Diagnosis diabetes, hyperlipidemia hypertension

## 2022-12-26 ENCOUNTER — Other Ambulatory Visit: Payer: Self-pay

## 2022-12-26 DIAGNOSIS — I10 Essential (primary) hypertension: Secondary | ICD-10-CM

## 2022-12-26 DIAGNOSIS — E1169 Type 2 diabetes mellitus with other specified complication: Secondary | ICD-10-CM

## 2022-12-26 DIAGNOSIS — E119 Type 2 diabetes mellitus without complications: Secondary | ICD-10-CM

## 2022-12-26 DIAGNOSIS — Z79899 Other long term (current) drug therapy: Secondary | ICD-10-CM

## 2022-12-26 MED ORDER — SEMAGLUTIDE (2 MG/DOSE) 8 MG/3ML ~~LOC~~ SOPN: 2.0000 mg | PEN_INJECTOR | SUBCUTANEOUS | 3 refills | Status: DC

## 2023-01-01 NOTE — Telephone Encounter (Signed)
Blood work ordered in [employer] and refill sent electronically to pharmacy

## 2023-01-27 ENCOUNTER — Other Ambulatory Visit: Payer: Self-pay | Admitting: Obstetrics & Gynecology

## 2023-01-29 ENCOUNTER — Telehealth: Payer: Self-pay

## 2023-01-29 NOTE — Telephone Encounter (Signed)
 Call pt is due on PAP NOVO NORDISK (OZEMPIC) left a HIPAA voice mgs to call back, will follow up.

## 2023-01-30 NOTE — Telephone Encounter (Signed)
 Patient call back and gave consent to fill [employer] Application on line,pt was not sure if would qualify this year. Have submitted application and fax Dr portion today 01/29/22.

## 2023-02-16 NOTE — Telephone Encounter (Signed)
Following up on pt PAP Novo Nordisk (Ozempic) re -faxing Dr Marylyn Ishihara not received it was fax on 01/30/23, will follow up.

## 2023-03-07 ENCOUNTER — Telehealth: Payer: Self-pay

## 2023-03-07 NOTE — Telephone Encounter (Signed)
Reason for CRM: Please have Nurse Autum call Ana per notes in patients chart review

## 2023-03-07 NOTE — Telephone Encounter (Signed)
Following up on pt pap for [employer] still waiting on Dr portion of application re send application plus gave Dr office a call  #[phone] fax# [phone] Carlatta ask to call back tomorrow and speak with [employer],regarding this matter.

## 2023-03-08 NOTE — Telephone Encounter (Signed)
Patient assistance

## 2023-03-08 NOTE — Telephone Encounter (Signed)
PCP Portion faxed back to [phone] Thank you!

## 2023-03-09 NOTE — Telephone Encounter (Signed)
Received pt application for [employer] from Dr office but had to send it back application was not sign or med was not included will follow up.

## 2023-03-12 NOTE — Telephone Encounter (Signed)
Ana, Im going to resubmit the patient portion online.   Raynelle Fanning, I havent rec'd the pcp pages for this one... want me to route the novo online app to you? Or do you want to refax? I know you're not in that office daily!

## 2023-03-13 ENCOUNTER — Encounter (HOSPITAL_COMMUNITY): Payer: Self-pay | Admitting: Hematology and Oncology

## 2023-03-13 ENCOUNTER — Other Ambulatory Visit (HOSPITAL_COMMUNITY): Payer: Self-pay

## 2023-03-14 ENCOUNTER — Other Ambulatory Visit (HOSPITAL_COMMUNITY): Payer: Self-pay

## 2023-03-22 NOTE — Telephone Encounter (Signed)
 I was able to correct the PCP pages. Im submitting them now :)

## 2023-03-27 ENCOUNTER — Ambulatory Visit: Payer: Medicare Other | Admitting: Family Medicine

## 2023-03-30 ENCOUNTER — Encounter: Payer: Self-pay | Admitting: Family Medicine

## 2023-03-30 LAB — BASIC METABOLIC PANEL
BUN/Creatinine Ratio: 11 — ABNORMAL LOW (ref 12–28)
BUN: 10 mg/dL (ref 8–27)
CO2: 22 mmol/L (ref 20–29)
Calcium: 9.2 mg/dL (ref 8.7–10.3)
Chloride: 101 mmol/L (ref 96–106)
Creatinine, Ser: 0.88 mg/dL (ref 0.57–1.00)
Glucose: 135 mg/dL — ABNORMAL HIGH (ref 70–99)
Potassium: 4.5 mmol/L (ref 3.5–5.2)
Sodium: 141 mmol/L (ref 134–144)
eGFR: 75 mL/min/{1.73_m2} (ref >59)

## 2023-03-30 LAB — MICROALBUMIN / CREATININE URINE RATIO
Creatinine, Urine: 243.2 mg/dL
Microalb/Creat Ratio: 10 mg/g{creat} (ref 0–29)
Microalbumin, Urine: 24.3 ug/mL

## 2023-03-30 LAB — LIPID PANEL
Chol/HDL Ratio: 3 ratio (ref 0.0–4.4)
Cholesterol, Total: 145 mg/dL (ref 100–199)
HDL: 49 mg/dL (ref >39)
LDL Chol Calc (NIH): 74 mg/dL (ref 0–99)
Triglycerides: 125 mg/dL (ref 0–149)
VLDL Cholesterol Cal: 22 mg/dL (ref 5–40)

## 2023-03-30 LAB — HEPATIC FUNCTION PANEL
ALT: 37 IU/L — ABNORMAL HIGH (ref 0–32)
AST: 30 IU/L (ref 0–40)
Albumin: 4.4 g/dL (ref 3.9–4.9)
Alkaline Phosphatase: 127 IU/L — ABNORMAL HIGH (ref 44–121)
Bilirubin Total: 0.3 mg/dL (ref 0.0–1.2)
Bilirubin, Direct: 0.1 mg/dL (ref 0.00–0.40)
Total Protein: 6.5 g/dL (ref 6.0–8.5)

## 2023-03-30 LAB — HEMOGLOBIN A1C
Est. average glucose Bld gHb Est-mCnc: 154 mg/dL
Hgb A1c MFr Bld: 7 % — ABNORMAL HIGH (ref 4.8–5.6)

## 2023-04-02 ENCOUNTER — Encounter: Payer: Self-pay | Admitting: Family Medicine

## 2023-04-02 ENCOUNTER — Ambulatory Visit: Payer: Medicare Other | Admitting: Family Medicine

## 2023-04-02 VITALS — BP 111/76 | HR 73 | Temp 98.2°F | Ht 63.0 in | Wt 176.0 lb

## 2023-04-02 DIAGNOSIS — I10 Essential (primary) hypertension: Secondary | ICD-10-CM

## 2023-04-02 DIAGNOSIS — Z794 Long term (current) use of insulin: Secondary | ICD-10-CM

## 2023-04-02 DIAGNOSIS — B070 Plantar wart: Secondary | ICD-10-CM | POA: Diagnosis not present

## 2023-04-02 DIAGNOSIS — E1169 Type 2 diabetes mellitus with other specified complication: Secondary | ICD-10-CM

## 2023-04-02 DIAGNOSIS — Z1231 Encounter for screening mammogram for malignant neoplasm of breast: Secondary | ICD-10-CM

## 2023-04-02 DIAGNOSIS — E119 Type 2 diabetes mellitus without complications: Secondary | ICD-10-CM

## 2023-04-02 DIAGNOSIS — E785 Hyperlipidemia, unspecified: Secondary | ICD-10-CM

## 2023-04-02 DIAGNOSIS — Z7985 Long-term (current) use of injectable non-insulin antidiabetic drugs: Secondary | ICD-10-CM

## 2023-04-02 DIAGNOSIS — Z1211 Encounter for screening for malignant neoplasm of colon: Secondary | ICD-10-CM

## 2023-04-02 NOTE — Progress Notes (Signed)
 Subjective:    Patient ID: Carrie Cook, female    DOB: 1960/05/15, 63 y.o.   MRN: [id number]  Discussed the use of AI scribe software for clinical note transcription with the patient, who gave verbal consent to proceed.  History of Present Illness   The patient, with type 2 diabetes mellitus, presents for a follow-up regarding diabetes management and medication adherence.  She is currently on Ozempic and has resumed her regimen after a brief delay due to travel. She tolerates the medication well and is consistent with her blood pressure and cholesterol medications. Her recent A1c was 7, which she attributes to a diet high in carbohydrates and frequent snacking. She acknowledges the need to improve her dietary habits, including increasing protein intake.  She recently traveled to [facility] for five days, which delayed her Ozempic dose by two days. Despite initial concerns, she managed her diet carefully and did not experience significant issues. She resumed her Ozempic regimen upon returning home and adjusted her dosing schedule accordingly.  She is making efforts to improve her diet by using protein granola bars, including meat in her meals, and consuming [place] yogurt regularly. She acknowledges the need to reduce carbohydrate intake and improve her dietary habits.  She reports being more active than before, engaging in activities like walking to her bird feeders and during shopping trips. She experienced back pain from increased walking during her trip to [place] and acknowledges she is not as active as she should be.  She is experiencing mood difficulties and is in therapy for medication management every four to six weeks. She plans to resume individual therapy, focusing on grief and other mental health issues. She finds some relief in listening to music, driving, and attending church. She maintains contact with her sisters through messaging, sharing humorous content.  She has not yet  rescheduled her diabetic eye exam, which was missed due to her trip to [place]. She had a recent eye exam for glasses, which showed mild cataracts but otherwise normal findings. She plans to reschedule the diabetic eye exam.  She reports plantar warts on her feet, which feel like walking on a rock. She is considering seeing a podiatrist for treatment.         Review of Systems     Objective:    Physical Exam   CHEST: Lungs clear to auscultation bilaterally. CARDIOVASCULAR: Heart sounds normal. EXTREMITIES: Mild swelling in legs.     General-in no acute distress Eyes-no discharge Lungs-respiratory rate normal, CTA CV-no murmurs,RRR Extremities skin warm dry no edema Neuro grossly normal Behavior normal, alert       Assessment & Plan:  Assessment and Plan    Type 2 Diabetes Mellitus A1c of 7, slightly increased from previous. Adherence to Ozempic and Metformin reported, but diet remains high in carbohydrates and patient reports frequent snacking. -Continue Ozempic and Metformin. -Encourage dietary modifications, focusing on reducing carbohydrate intake and increasing protein. -Check A1c in 6 months.  Hyperlipidemia LDL slightly elevated at 74. -Encourage dietary modifications, focusing on reducing saturated fats. -Recheck lipid panel in 6 months.  Fatty Liver Mild elevation in liver enzymes, likely secondary to fatty liver due to excess weight. -Encourage weight loss through diet and increased physical activity. -Recheck liver enzymes in 6 months.  Plantar Warts Noted on both feet, causing discomfort. -Refer to podiatry for evaluation and treatment.  Depression Ongoing mental health concerns, currently seeing a therapist for medication management and planning to reinitiate individual therapy. -Continue current mental health  treatment plan.  General Health Maintenance -Diabetic eye exam overdue, patient to reschedule. -Encourage continued increase in physical  activity, including walking.     1. Essential hypertension (Primary) HTN- patient seen for follow-up regarding HTN.   Diet, medication compliance, appropriate labs and refills were completed.   Importance of keeping blood pressure under good control to lessen the risk of complications discussed Regular follow-up visits discussed   2. Hyperlipidemia associated with type 2 diabetes mellitus (HCC) Hyperlipidemia-importance of diet, weight control, activity, compliance with medications discussed.   Recent labs reviewed.   Any additional labs or refills ordered.   Importance of keeping under good control discussed. Regular follow-up visits discussed   3. Type 2 diabetes mellitus without complication, with long-term current use of insulin (HCC) The patient was seen today as part of a comprehensive visit for diabetes. The importance of keeping her A1c at or below 7 range was discussed.  Discussed diet, activity, and medication compliance Emphasized healthy eating primarily with vegetables fruits and if utilizing meats lean meats such as chicken or fish grilled baked broiled Avoid sugary drinks Minimize and avoid processed foods Fit in regular physical activity preferably 25 to 30 minutes 4 times per week Standard follow-up visit recommended.  Patient aware lack of control and follow-up increases risk of diabetic complications. Regular follow-up visits Yearly ophthalmology Yearly foot exam Patient to work hard on healthy eating regular activity  4. Plantar wart Plantar wart bilateral referral to podiatry - Ambulatory referral to Podiatry  5. Screening mammogram for breast cancer Screening - MM 3D SCREENING MAMMOGRAM BILATERAL BREAST  6. Screening for colon cancer Screening - Ambulatory referral to Gastroenterology  Follow-up 6 months

## 2023-04-04 ENCOUNTER — Encounter: Payer: Self-pay | Admitting: *Deleted

## 2023-04-04 ENCOUNTER — Other Ambulatory Visit: Payer: Self-pay

## 2023-04-04 DIAGNOSIS — E1169 Type 2 diabetes mellitus with other specified complication: Secondary | ICD-10-CM

## 2023-04-04 DIAGNOSIS — E119 Type 2 diabetes mellitus without complications: Secondary | ICD-10-CM

## 2023-04-04 DIAGNOSIS — Z79899 Other long term (current) drug therapy: Secondary | ICD-10-CM

## 2023-04-10 ENCOUNTER — Ambulatory Visit: Admitting: Podiatry

## 2023-04-12 ENCOUNTER — Ambulatory Visit (INDEPENDENT_AMBULATORY_CARE_PROVIDER_SITE_OTHER): Admitting: Podiatry

## 2023-04-12 ENCOUNTER — Encounter: Payer: Self-pay | Admitting: Podiatry

## 2023-04-12 DIAGNOSIS — D2372 Other benign neoplasm of skin of left lower limb, including hip: Secondary | ICD-10-CM

## 2023-04-12 DIAGNOSIS — E1142 Type 2 diabetes mellitus with diabetic polyneuropathy: Secondary | ICD-10-CM

## 2023-04-12 DIAGNOSIS — D2371 Other benign neoplasm of skin of right lower limb, including hip: Secondary | ICD-10-CM | POA: Diagnosis not present

## 2023-04-12 NOTE — Progress Notes (Signed)
 Subjective:  Patient ID: Carrie Cook, female    DOB: June 29, 1960,  MRN: [id number] HPI Chief Complaint  Patient presents with   Callouses    Plantar heels bilateral (L>R) - small, callused lesions x several months, tender when walking, diabetic with neuropathy with nerve damage in lower limbs from back surgeries - last A1c was 7.0, takes gabapentin 400mg  - 1AM, 1PM, 2QHS   New Patient (Initial Visit)    63 y.o. female presents with the above complaint.   ROS: Denies fever chills nausea vomit muscle aches pains calf pain back pain chest pain shortness of breath.  Past Medical History:  Diagnosis Date   Allergy    Anemia    PT HAS HAD COLONOSCOPY AND ENDOSCOPY WORK UP - NO PROBLEMS FOUND AS SOURCE OF ANEMIA -- PT HAD IRON INFUSION AT [facility] 11/13/12-AFTER SEEING HEMATOLOGIST DR. FORMANEK AND HE GAVE HEMATOLOGIC CLEARANCE FOR GASTRIC BYPASS SURGERY.   Anxiety    Asthma    daily and prn inhalers   COPD (chronic obstructive pulmonary disease) (HCC)    Degenerative joint disease    right knee, spine - STATES INTERMITTENT  NUMBNESS DOWN RT LEG WITH PROLONGED STANDING OR WALKING- THINKS RELATED TO HER SPINE PROBLEMS   Dental crowns present    all permanent crowns   Depression    Diabetes mellitus, type 2 (HCC)    Family history of anesthesia complication    pt's mother and sister have hx. of post-op N/V   Fibroids    UTERINE   Gastroesophageal reflux disease    none for 2 years since Bariatric surgery.   H/O hiatal hernia    History of endometriosis    History of kidney stones    passed stones   Hyperlipidemia    Hypertension    under control with med., has been on med. x 2 yr.   Lock jaw    jaw locks open if opens mouth wide   Migraines    Neuromuscular disorder (HCC)    Neuropathy    bilateral feet   Obesity    Palpitations    no current problems   PONV (postoperative nausea and vomiting)    Shortness of breath    with daily activities, albuterol inhaler    Sleep apnea    does not use cpap   Ulcer    Past Surgical History:  Procedure Laterality Date   ABDOMINAL HYSTERECTOMY N/A 06/06/2017   Procedure: HYSTERECTOMY ABDOMINAL;  Surgeon: Lazaro Arms, MD;  Location: [facility];  Service: Gynecology;  Laterality: N/A;   BACK SURGERY     BREATH TEK H PYLORI N/A 08/13/2012   Procedure: BREATH TEK H PYLORI;  Surgeon: Mariella Saa, MD;  Location: [facility];  Service: General;  Laterality: N/A;   CARPAL TUNNEL RELEASE  01/03/2012   Procedure: CARPAL TUNNEL RELEASE RIGHT HAND;  Surgeon: Nicki Reaper, MD;  Location: [facility];  Service: Orthopedics;  Laterality: Right;   COLONOSCOPY  05/2009   WUJ:[id number] hemorrhoids otherwise normal colon, rectum and terminal ileum   COLONOSCOPY WITH ESOPHAGOGASTRODUODENOSCOPY (EGD) N/A 10/28/2012   Procedure: COLONOSCOPY WITH ESOPHAGOGASTRODUODENOSCOPY (EGD);  Surgeon: Corbin Ade, MD;  Location: [facility];  Service: Endoscopy;  Laterality: N/A;  7:30   DILITATION & CURRETTAGE/HYSTROSCOPY WITH NOVASURE ABLATION N/A 07/22/2014   Procedure: DILATATION & CURETTAGE/HYSTEROSCOPY WITH NOVASURE ABLATION; uterine length 6.0 cm; uterine width 3.8 cm; total ablation time 1 minute 15 seconds;  Surgeon: Lazaro Arms, MD;  Location:  [facility];  Service: Gynecology;  Laterality: N/A;   ESOPHAGOGASTRODUODENOSCOPY  06/04/2009   AVW:UJWJXB esophagus/multiple polyps removed s/p (hyperplastic). Gastritis without H.pylori.   ESOPHAGOGASTRODUODENOSCOPY (EGD) WITH PROPOFOL N/A 06/06/2018   with LA Grade A esophagitis s/p dilation.    GASTRIC ROUX-EN-Y N/A 11/19/2012   Procedure: LAPAROSCOPIC ROUX-EN-Y GASTRIC;  Surgeon: Mariella Saa, MD;  Location: [facility];  Service: General;  Laterality: N/A;   GIVENS CAPSULE STUDY N/A 10/28/2012   Procedure: GIVENS CAPSULE STUDY;  Surgeon: Corbin Ade, MD;  Location: [facility];  Service: Endoscopy;  Laterality: N/A;   JOINT REPLACEMENT     KNEE ARTHROSCOPY WITH  MEDIAL MENISECTOMY Right 10/28/2015   Procedure: RIGHT KNEE ARTHROSCOPY WITH MEDIAL MENISECTOMY;  Surgeon: Vickki Hearing, MD;  Location: [facility];  Service: Orthopedics;  Laterality: Right;   LAPAROSCOPY     due to endometriosis   LUMBAR LAMINECTOMY/DECOMPRESSION MICRODISCECTOMY N/A 02/14/2020   Procedure: LUMBAR LAMINECTOMY  Lumbar three-four, Lumbar four-five;  Surgeon: Lisbeth Renshaw, MD;  Location: [facility];  Service: Neurosurgery;  Laterality: N/A;   LUMBAR LAMINECTOMY/DECOMPRESSION MICRODISCECTOMY Right 12/21/2020   Procedure: Right open Lumbar four-five microdiscectomy;  Surgeon: Jadene Pierini, MD;  Location: [facility];  Service: Neurosurgery;  Laterality: Right;   MALONEY DILATION N/A 06/06/2018   Procedure: Elease Hashimoto DILATION;  Surgeon: Corbin Ade, MD;  Location: [facility];  Service: Endoscopy;  Laterality: N/A;   PELVIC LAPAROSCOPY  11/04/1999   with fulguration of endometriosis   SALPINGOOPHORECTOMY Bilateral 06/06/2017   Procedure: SALPINGO OOPHORECTOMY;  Surgeon: Lazaro Arms, MD;  Location: [facility];  Service: Gynecology;  Laterality: Bilateral;   SMALL INTESTINE SURGERY     Gastric bypass surgery   SPINE SURGERY     TOTAL KNEE ARTHROPLASTY Right 07/22/2019   Procedure: TOTAL KNEE ARTHROPLASTY;  Surgeon: Vickki Hearing, MD;  Location: [facility];  Service: Orthopedics;  Laterality: Right;   TRIGGER FINGER RELEASE Right 09/24/2012   Procedure: RELEASE A-1 PULLEY OF RIGHT THUMB;  Surgeon: Nicki Reaper, MD;  Location: [facility];  Service: Orthopedics;  Laterality: Right;   TUBAL LIGATION  1993    Current Outpatient Medications:    albuterol (VENTOLIN HFA) 108 (90 Base) MCG/ACT inhaler, Inhale 2 puffs into the lungs every 6 (six) hours as needed for wheezing., Disp: 1 each, Rfl: 6   amLODipine (NORVASC) 2.5 MG tablet, 1 qd, Disp: 90 tablet, Rfl: 1   cetirizine (ZYRTEC) 10 MG tablet, Take 10 mg by mouth daily., Disp: , Rfl:    cloNIDine (CATAPRES)  0.1 MG tablet, Take 0.1 mg by mouth 2 (two) times daily as needed. 1 tablet in the morning, 2 tablets at night, Disp: , Rfl:    desvenlafaxine (PRISTIQ) 100 MG 24 hr tablet, Take by mouth., Disp: , Rfl:    estradiol (ESTRACE) 2 MG tablet, TAKE 1 TABLET(2 MG) BY MOUTH AT BEDTIME, Disp: 90 tablet, Rfl: 3   fluticasone (FLOVENT HFA) 220 MCG/ACT inhaler, Inhale 2 puffs into the lungs 2 (two) times daily. (Patient taking differently: Inhale 2 puffs into the lungs daily as needed (Shortness of breath).), Disp: 1 Inhaler, Rfl: 12   gabapentin (NEURONTIN) 400 MG capsule, 1 qam 1 mid day and 2 qhs, Disp: 120 capsule, Rfl: 5   Insulin Pen Needle (PEN NEEDLES) 32G X 4 MM MISC, 32 g by Does not apply route daily., Disp: 90 each, Rfl: 0   lamoTRIgine (LAMICTAL) 100 MG tablet, Take 100 mg by mouth 2 (two) times  daily. , Disp: , Rfl:    losartan (COZAAR) 100 MG tablet, TAKE 1 TABLET(100 MG) BY MOUTH DAILY, Disp: 90 tablet, Rfl: 1   lurasidone (LATUDA) 20 MG TABS tablet, Take 40 mg by mouth at bedtime., Disp: , Rfl:    Multiple Vitamin (MULTIVITAMIN WITH MINERALS) TABS tablet, Take 1 tablet by mouth daily., Disp: , Rfl:    rosuvastatin (CRESTOR) 20 MG tablet, TAKE 1 TABLET BY MOUTH EVERY DAY, Disp: 90 tablet, Rfl: 1   Semaglutide, 2 MG/DOSE, 8 MG/3ML SOPN, Inject 2 mg as directed once a week., Disp: 12 mL, Rfl: 3  Allergies  Allergen Reactions   Qvar [Beclomethasone] Shortness Of Breath and Other (See Comments)    Patient states that this medication causes her to feel short of breath   Sulfa Antibiotics Hives and Rash   Tizanidine Shortness Of Breath, Itching and Rash   Ace Inhibitors Cough   Hydrochlorothiazide Cough   Prozac [Fluoxetine Hcl] Hives   Sulfonamide Derivatives Hives and Rash   Dyazide [Hydrochlorothiazide W-Triamterene] Cough   Erythromycin Hives and Other (See Comments)    nausea   Lipitor [Atorvastatin Calcium] Other (See Comments)    Body aches, flu-like symptoms   Lisinopril Cough    Metformin And Related Nausea And Vomiting    Severe gi sx   Pravastatin Other (See Comments)    BODY ACHES, FLU-LIKE SX.   Review of Systems Objective:  There were no vitals filed for this visit.  General: Well developed, nourished, in no acute distress, alert and oriented x3   Dermatological: Skin is warm, dry and supple bilateral. Nails x 10 are well maintained; remaining integument appears unremarkable at this time. There are no open sores, no preulcerative lesions, no rash or signs of infection present.  Skin lesions plantar aspect of the bilateral heels.  Do not demonstrate any signs of wart.  Okay yeah  Vascular: Dorsalis Pedis artery and Posterior Tibial artery pedal pulses are 2/4 bilateral with immedate capillary fill time. Pedal hair growth present. No varicosities and no lower extremity edema present bilateral.   Neruologic: Grossly intact via light touch bilateral. Vibratory intact via tuning fork bilateral. Protective threshold with Semmes Wienstein monofilament intact to all pedal sites bilateral but hypersensitive.. Patellar and Achilles deep tendon reflexes 2+ bilateral. No Babinski or clonus noted bilateral.   Musculoskeletal: No gross boney pedal deformities bilateral. No pain, crepitus, or limitation noted with foot and ankle range of motion bilateral. Muscular strength 5/5 in all groups tested bilateral.  Gait: Unassisted,  Assessment & Plan:   Assessment: Diabetic peripheral neuropathy radiculopathy and spinal stenosis resulting in her pain.  Benign skin lesions plantar aspect of bilateral foot  Plan: Debrided benign skin lesions for now are going to leave her gabapentin where it is.       Broadus Costilla T. [place], [place]

## 2023-04-13 ENCOUNTER — Other Ambulatory Visit: Payer: Self-pay | Admitting: Family Medicine

## 2023-04-13 ENCOUNTER — Other Ambulatory Visit: Payer: Self-pay

## 2023-04-13 MED ORDER — AMLODIPINE BESYLATE 2.5 MG PO TABS: ORAL_TABLET | ORAL | 1 refills | Status: DC

## 2023-04-23 ENCOUNTER — Other Ambulatory Visit: Payer: Self-pay

## 2023-04-23 MED ORDER — ALBUTEROL SULFATE HFA 108 (90 BASE) MCG/ACT IN AERS: 2.0000 | INHALATION_SPRAY | Freq: Four times a day (QID) | RESPIRATORY_TRACT | 2 refills | Status: DC | PRN

## 2023-04-30 ENCOUNTER — Other Ambulatory Visit: Payer: Self-pay

## 2023-04-30 MED ORDER — ALBUTEROL SULFATE HFA 108 (90 BASE) MCG/ACT IN AERS: 2.0000 | INHALATION_SPRAY | Freq: Four times a day (QID) | RESPIRATORY_TRACT | 2 refills | Status: AC | PRN

## 2023-05-18 ENCOUNTER — Ambulatory Visit: Payer: Medicare Other

## 2023-05-18 VITALS — Ht 63.0 in | Wt 176.0 lb

## 2023-05-18 DIAGNOSIS — Z0000 Encounter for general adult medical examination without abnormal findings: Secondary | ICD-10-CM | POA: Diagnosis not present

## 2023-05-18 NOTE — Patient Instructions (Signed)
 Carrie Cook , Thank you for taking time to come for your Medicare Wellness Visit. I appreciate your ongoing commitment to your health goals. Please review the following plan we discussed and let me know if I can assist you in the future.   Referrals/Orders/Follow-Ups/Clinician Recommendations: Aim for 30 minutes of exercise or brisk walking, 6-8 glasses of water , and 5 servings of fruits and vegetables each day.  This is a list of the screening recommended for you and due dates:  Health Maintenance  Topic Date Due   DTaP/Tdap/Td vaccine (1 - Tdap) Never done   Colon Cancer Screening  10/29/2022   Eye exam for diabetics  09/24/2023*   COVID-19 Vaccine (3 - Moderna risk series) 10/10/2023*   Zoster (Shingles) Vaccine (1 of 2) 10/11/2023*   Mammogram  08/02/2023   Flu Shot  08/24/2023   Hemoglobin A1C  09/29/2023   Yearly kidney function blood test for diabetes  03/28/2024   Yearly kidney health urinalysis for diabetes  03/28/2024   Complete foot exam   04/01/2024   Medicare Annual Wellness Visit  05/17/2024   Pneumococcal Vaccination  Completed   Hepatitis C Screening  Completed   HIV Screening  Completed   HPV Vaccine  Aged Out   Meningitis B Vaccine  Aged Out  *Topic was postponed. The date shown is not the original due date.    Advanced directives: (ACP Link)Information on Advanced Care Planning can be found at [place]  Secretary of [facility] Advance Health Care Directives Advance Health Care Directives. [email or web address]   Next Medicare Annual Wellness Visit scheduled for next year: Yes

## 2023-05-18 NOTE — Progress Notes (Signed)
 Subjective:   Carrie Cook is a 63 y.o. who presents for a Medicare Wellness preventive visit.  Visit Complete: Virtual I connected with  Carrie Cook on 05/18/23 by a audio enabled telemedicine application and verified that I am speaking with the correct person using two identifiers.  Patient Location: Home  Provider Location: Home Office  I discussed the limitations of evaluation and management by telemedicine. The patient expressed understanding and agreed to proceed.  Vital Signs: Because this visit was a virtual/telehealth visit, some criteria may be missing or patient reported. Any vitals not documented were not able to be obtained and vitals that have been documented are patient reported.  VideoDeclined- This patient declined Librarian, academic. Therefore the visit was completed with audio only.  Persons Participating in Visit: Patient.  AWV Questionnaire: No: Patient Medicare AWV questionnaire was not completed prior to this visit.  Cardiac Risk Factors include: dyslipidemia;diabetes mellitus;hypertension     Objective:    Today's Vitals   05/18/23 0953  Weight: 176 lb (79.8 kg)  Height: 5\' 3"  (1.6 m)   Body mass index is 31.18 kg/m.     05/18/2023    9:58 AM 06/15/2021   12:54 PM 05/03/2021    9:25 AM 12/21/2020    6:56 AM 03/17/2020    3:47 PM 02/24/2020    6:19 PM 02/14/2020    8:57 PM  Advanced Directives  Does Patient Have a Medical Advance Directive? No No No No No No   Would patient like information on creating a medical advance directive? Yes (MAU/Ambulatory/Procedural Areas - Information given) No - Patient declined No - Patient declined No - Patient declined No - Patient declined No - Patient declined No - Patient declined    Current Medications (verified) Outpatient Encounter Medications as of 05/18/2023  Medication Sig   albuterol  (VENTOLIN  HFA) 108 (90 Base) MCG/ACT inhaler Inhale 2 puffs into the lungs every 6  (six) hours as needed for wheezing.   amLODipine  (NORVASC ) 2.5 MG tablet 1 qd   cetirizine (ZYRTEC) 10 MG tablet Take 10 mg by mouth daily.   cloNIDine  (CATAPRES ) 0.1 MG tablet Take 0.1 mg by mouth 2 (two) times daily as needed. 1 tablet in the morning, 2 tablets at night   desvenlafaxine (PRISTIQ) 100 MG 24 hr tablet Take by mouth.   estradiol  (ESTRACE ) 2 MG tablet TAKE 1 TABLET(2 MG) BY MOUTH AT BEDTIME   fluticasone  (FLOVENT  HFA) 220 MCG/ACT inhaler Inhale 2 puffs into the lungs 2 (two) times daily. (Patient taking differently: Inhale 2 puffs into the lungs daily as needed (Shortness of breath).)   gabapentin  (NEURONTIN ) 400 MG capsule 1 qam 1 mid day and 2 qhs   Insulin  Pen Needle (PEN NEEDLES) 32G X 4 MM MISC 32 g by Does not apply route daily.   lamoTRIgine  (LAMICTAL ) 100 MG tablet Take 100 mg by mouth 2 (two) times daily.    losartan  (COZAAR ) 100 MG tablet TAKE 1 TABLET(100 MG) BY MOUTH DAILY   lurasidone (LATUDA) 20 MG TABS tablet Take 40 mg by mouth at bedtime.   Multiple Vitamin (MULTIVITAMIN WITH MINERALS) TABS tablet Take 1 tablet by mouth daily.   rosuvastatin  (CRESTOR ) 20 MG tablet TAKE 1 TABLET BY MOUTH EVERY DAY   Semaglutide , 2 MG/DOSE, 8 MG/3ML SOPN Inject 2 mg as directed once a week.   No facility-administered encounter medications on file as of 05/18/2023.    Allergies (verified) Qvar  [beclomethasone], Sulfa antibiotics, Tizanidine , Ace inhibitors, Hydrochlorothiazide , Prozac [  fluoxetine hcl], Sulfonamide derivatives, Dyazide [hydrochlorothiazide -triamterene], Erythromycin, Lipitor [atorvastatin  calcium ], Lisinopril, Metformin  and related, and Pravastatin   History: Past Medical History:  Diagnosis Date   Allergy    Anemia    PT HAS HAD COLONOSCOPY AND ENDOSCOPY WORK UP - NO PROBLEMS FOUND AS SOURCE OF ANEMIA -- PT HAD IRON  INFUSION AT [facility] 11/13/12-AFTER SEEING HEMATOLOGIST DR. FORMANEK AND HE GAVE HEMATOLOGIC CLEARANCE FOR GASTRIC BYPASS SURGERY.   Anxiety     Asthma    daily and prn inhalers   COPD (chronic obstructive pulmonary disease) (HCC)    Degenerative joint disease    right knee, spine - STATES INTERMITTENT  NUMBNESS DOWN RT LEG WITH PROLONGED STANDING OR WALKING- THINKS RELATED TO HER SPINE PROBLEMS   Dental crowns present    all permanent crowns   Depression    Diabetes mellitus, type 2 (HCC)    Family history of anesthesia complication    pt's mother and sister have hx. of post-op N/V   Fibroids    UTERINE   Gastroesophageal reflux disease    none for 2 years since Bariatric surgery.   H/O hiatal hernia    History of endometriosis    History of kidney stones    passed stones   Hyperlipidemia    Hypertension    under control with med., has been on med. x 2 yr.   Lock jaw    jaw locks open if opens mouth wide   Migraines    Neuromuscular disorder (HCC)    Neuropathy    bilateral feet   Obesity    Palpitations    no current problems   PONV (postoperative nausea and vomiting)    Shortness of breath    with daily activities, albuterol  inhaler   Sleep apnea    does not use cpap   Ulcer    Past Surgical History:  Procedure Laterality Date   ABDOMINAL HYSTERECTOMY N/A 06/06/2017   Procedure: HYSTERECTOMY ABDOMINAL;  Surgeon: Wendelyn Halter, MD;  Location: [facility];  Service: Gynecology;  Laterality: N/A;   BACK SURGERY     BREATH TEK H PYLORI N/A 08/13/2012   Procedure: BREATH TEK H PYLORI;  Surgeon: Quitman Bucy, MD;  Location: [facility];  Service: General;  Laterality: N/A;   CARPAL TUNNEL RELEASE  01/03/2012   Procedure: CARPAL TUNNEL RELEASE RIGHT HAND;  Surgeon: Kemp Patter, MD;  Location: [facility];  Service: Orthopedics;  Laterality: Right;   COLONOSCOPY  05/2009   QIO:[id number] hemorrhoids otherwise normal colon, rectum and terminal ileum   COLONOSCOPY WITH ESOPHAGOGASTRODUODENOSCOPY (EGD) N/A 10/28/2012   Procedure: COLONOSCOPY WITH ESOPHAGOGASTRODUODENOSCOPY (EGD);  Surgeon: Suzette Espy, MD;  [facility]: [facility];  Service: Endoscopy;  Laterality: N/A;  7:30   DILITATION & CURRETTAGE/HYSTROSCOPY WITH NOVASURE ABLATION N/A 07/22/2014   Procedure: DILATATION & CURETTAGE/HYSTEROSCOPY WITH NOVASURE ABLATION; uterine length 6.0 cm; uterine width 3.8 cm; total ablation time 1 minute 15 seconds;  Surgeon: Wendelyn Halter, MD;  Location: [facility];  Service: Gynecology;  Laterality: N/A;   ESOPHAGOGASTRODUODENOSCOPY  06/04/2009   XLK:GMWNUU esophagus/multiple polyps removed s/p (hyperplastic). Gastritis without H.pylori.   ESOPHAGOGASTRODUODENOSCOPY (EGD) WITH PROPOFOL  N/A 06/06/2018   with LA Grade A esophagitis s/p dilation.    GASTRIC ROUX-EN-Y N/A 11/19/2012   Procedure: LAPAROSCOPIC ROUX-EN-Y GASTRIC;  Surgeon: Quitman Bucy, MD;  Location: [facility];  Service: General;  Laterality: N/A;   GIVENS CAPSULE STUDY N/A 10/28/2012   Procedure: GIVENS CAPSULE STUDY;  Surgeon:  Suzette Espy, MD;  [facility];  Service: Endoscopy;  Laterality: N/A;   JOINT REPLACEMENT     KNEE ARTHROSCOPY WITH MEDIAL MENISECTOMY Right 10/28/2015   Procedure: RIGHT KNEE ARTHROSCOPY WITH MEDIAL MENISECTOMY;  Surgeon: Darrin Emerald, MD;  Location: [facility];  Service: Orthopedics;  Laterality: Right;   LAPAROSCOPY     due to endometriosis   LUMBAR LAMINECTOMY/DECOMPRESSION MICRODISCECTOMY N/A 02/14/2020   Procedure: LUMBAR LAMINECTOMY  Lumbar three-four, Lumbar four-five;  Surgeon: Augusto Blonder, MD;  Location: [facility];  Service: Neurosurgery;  Laterality: N/A;   LUMBAR LAMINECTOMY/DECOMPRESSION MICRODISCECTOMY Right 12/21/2020   Procedure: Right open Lumbar four-five microdiscectomy;  Surgeon: Cannon Champion, MD;  Location: [facility];  Service: Neurosurgery;  Laterality: Right;   MALONEY DILATION N/A 06/06/2018   Procedure: Londa Rival DILATION;  Surgeon: Suzette Espy, MD;  [facility];  Service: Endoscopy;  Laterality: N/A;   PELVIC LAPAROSCOPY  11/04/1999   with  fulguration of endometriosis   SALPINGOOPHORECTOMY Bilateral 06/06/2017   Procedure: SALPINGO OOPHORECTOMY;  Surgeon: Wendelyn Halter, MD;  Location: [facility];  Service: Gynecology;  Laterality: Bilateral;   SMALL INTESTINE SURGERY     Gastric bypass surgery   SPINE SURGERY     TOTAL KNEE ARTHROPLASTY Right 07/22/2019   Procedure: TOTAL KNEE ARTHROPLASTY;  Surgeon: Darrin Emerald, MD;  Location: [facility];  Service: Orthopedics;  Laterality: Right;   TRIGGER FINGER RELEASE Right 09/24/2012   Procedure: RELEASE A-1 PULLEY OF RIGHT THUMB;  Surgeon: Kemp Patter, MD;  Location: [facility];  Service: Orthopedics;  Laterality: Right;   TUBAL LIGATION  1993   Family History  Problem Relation Age of Onset   Hypertension Father    Hyperlipidemia Father    COPD Father    Arthritis Father    Heart disease Father    COPD Mother    Heart disease Mother    Cancer Mother        Cervix   Anesthesia problems Mother        post-op N/V   Thyroid  disease Mother    Alcohol abuse Mother    Anxiety disorder Mother    Arthritis Mother    Asthma Mother    Depression Mother    Drug abuse Mother    Hypertension Mother    Diabetes Maternal Grandfather    Thyroid  disease Sister    Anesthesia problems Sister        post-op N/V   ADD / ADHD Sister    Anxiety disorder Sister    Arthritis Sister    Depression Sister    Hypertension Sister    Obesity Sister    Varicose Veins Sister    Thyroid  disease Paternal Uncle    Diabetes Other    Arthritis Maternal Grandmother    COPD Maternal Grandmother    Depression Maternal Grandmother    Arthritis Paternal Grandfather    Stroke Paternal Grandfather    Arthritis Paternal Grandmother    Diabetes Paternal Grandmother    Hypertension Paternal Grandmother    Obesity Paternal Grandmother    Depression Paternal Grandmother    ADD / ADHD Brother    Asthma Brother    ADD / ADHD Brother    Asthma Brother    Hypertension Brother    Obesity  Brother    Anxiety disorder Sister    Colon cancer Neg Hx    Colon polyps Neg Hx    Social History   Socioeconomic History  Marital status: Married    Spouse name: Porfirio Bristol   Number of children: 1   Years of education: Not on file   Highest education level: Some college, no degree  Occupational History   Occupation: disability  Tobacco Use   Smoking status: Never   Smokeless tobacco: Never  Vaping Use   Vaping status: Never Used  Substance and Sexual Activity   Alcohol use: No   Drug use: No   Sexual activity: Not Currently    Birth control/protection: Surgical    Comment: Hysterectomy  Other Topics Concern   Not on file  Social History Narrative   Not on file   Social Drivers of Health   Financial Resource Strain: Patient Declined (05/18/2023)   Overall Financial Resource Strain (CARDIA)    Difficulty of Paying Living Expenses: Patient declined  Food Insecurity: Patient Declined (05/18/2023)   Hunger Vital Sign    Worried About Running Out of Food in the Last Year: Patient declined    Ran Out of Food in the Last Year: Patient declined  Transportation Needs: No Transportation Needs (05/18/2023)   PRAPARE - Administrator, Civil Service (Medical): No    Lack of Transportation (Non-Medical): No  Physical Activity: Inactive (05/18/2023)   Exercise Vital Sign    Days of Exercise per Week: 0 days    Minutes of Exercise per Session: 0 min  Stress: Stress Concern Present (05/18/2023)   [employer] of Occupational Health - Occupational Stress Questionnaire    Feeling of Stress : Rather much  Social Connections: Unknown (05/18/2023)   Social Connection and Isolation Panel [NHANES]    Frequency of Communication with Friends and Family: Patient declined    Frequency of Social Gatherings with Friends and Family: Patient declined    Attends Religious Services: More than 4 times per year    Active Member of [employer] or Organizations: No    Attends Museum/gallery exhibitions officer: Never    Marital Status: Married    Tobacco Counseling Counseling given: Not Answered    Clinical Intake:  Pre-visit preparation completed: Yes  Pain : No/denies pain     Diabetes: Yes CBG done?: No Did pt. bring in CBG monitor from home?: No  Lab Results  Component Value Date   HGBA1C 7.0 (H) 03/29/2023   HGBA1C 6.7 (H) 10/13/2022   HGBA1C 6.6 (H) 04/25/2022     How often do you need to have someone help you when you read instructions, pamphlets, or other written materials from your doctor or pharmacy?: 1 - Never  Interpreter Needed?: No  Information entered by :: Seabron Cypress LPN   Activities of Daily Living     05/18/2023    9:58 AM  In your present state of health, do you have any difficulty performing the following activities:  Hearing? 0  Vision? 0  Difficulty concentrating or making decisions? 0  Walking or climbing stairs? 0  Dressing or bathing? 0  Doing errands, shopping? 0  Preparing Food and eating ? N  Using the Toilet? N  In the past six months, have you accidently leaked urine? N  Do you have problems with loss of bowel control? N  Managing your Medications? N  Managing your Finances? N  Housekeeping or managing your Housekeeping? N    Patient Care Team: Bennet Brasil, MD as PCP - General (Family Medicine) Rothbart, Windsor Hatcher, MD (Inactive) (Cardiology) Himmelrich, Jesslyn Moro, RD (Inactive) as Dietitian (Bariatrics) Iona Manis, RN (Inactive) as  Registered Nurse Cannon Champion, MD as Consulting Physician (Neurosurgery) Services, [facility]  Indicate any recent Medical Services you may have received from other than [facility] providers in the past year (date may be approximate).     Assessment:   This is a routine wellness examination for Carrie Cook.  Hearing/Vision screen Hearing Screening - Comments:: Denies hearing difficulties   Vision Screening - Comments::  Wears rx glasses - up to date with routine eye exams with [facility]. Alyse July    Goals Addressed             This Visit's Progress    Exercise 3x per week (30 min per time)   On track    Encouraged pt to try chair exercises. "Get well enough to travel and take care of house."       Depression Screen     05/18/2023    9:56 AM 04/02/2023    2:31 PM 10/17/2022    2:53 PM 05/17/2022   10:37 AM 05/12/2022    8:57 AM 12/26/2021    1:14 PM 11/18/2021    1:54 PM  PHQ 2/9 Scores  PHQ - 2 Score 4 4 4 5 4 4 6   PHQ- 9 Score 20 21 19 23 15 20 27     Fall Risk     05/18/2023    9:57 AM 04/02/2023    2:30 PM 10/17/2022    2:54 PM 05/17/2022   10:37 AM 05/12/2022    9:01 AM  Fall Risk   Falls in the past year? 1 1 1 1 1   Number falls in past yr: 1 1 0 0 0  Injury with Fall? 1 1 0 0 0  Risk for fall due to : History of fall(s);Impaired balance/gait;Impaired mobility  History of fall(s)  History of fall(s)  Follow up Education provided;Falls prevention discussed;Falls evaluation completed  Falls evaluation completed  Falls evaluation completed;Education provided;Falls prevention discussed    MEDICARE RISK AT HOME:  Medicare Risk at Home Any stairs in or around the home?: No If so, are there any without handrails?: No Home free of loose throw rugs in walkways, pet beds, electrical cords, etc?: Yes Adequate lighting in your home to reduce risk of falls?: Yes Life alert?: No Use of a cane, walker or w/c?: No Grab bars in the bathroom?: Yes Shower chair or bench in shower?: No Elevated toilet seat or a handicapped toilet?: Yes  TIMED UP AND GO:  Was the test performed?  No  Cognitive Function: 6CIT completed        05/18/2023    9:58 AM 05/12/2022    9:02 AM 05/03/2021    9:31 AM  6CIT Screen  What Year? 0 points 0 points 0 points  What month? 0 points 0 points 0 points  What time? 0 points 0 points 0 points  Count back from 20 0 points 0 points 0 points  Months in reverse 0 points  0 points 0 points  Repeat phrase 0 points 0 points 0 points  Total Score 0 points 0 points 0 points    Immunizations Immunization History  Administered Date(s) Administered   Influenza Split 12/10/2012   Influenza, Seasonal, Injecte, Preservative Fre 10/17/2022   Influenza,inj,Quad PF,6+ Mos 12/11/2013, 01/06/2015, 10/06/2019, 11/22/2020, 11/18/2021   Moderna Sars-Covid-2 Vaccination 04/24/2019, 05/27/2019   PNEUMOCOCCAL CONJUGATE-20 11/22/2020   Pneumococcal Polysaccharide-23 12/11/2013    Screening Tests Health Maintenance  Topic Date Due   DTaP/Tdap/Td (1 - Tdap) Never  done   Colonoscopy  10/29/2022   OPHTHALMOLOGY EXAM  09/24/2023 (Originally 12/27/2014)   COVID-19 Vaccine (3 - Moderna risk series) 10/10/2023 (Originally 06/24/2019)   Zoster Vaccines- Shingrix (1 of 2) 10/11/2023 (Originally [birth year]/1981)   MAMMOGRAM  08/02/2023   INFLUENZA VACCINE  08/24/2023   HEMOGLOBIN A1C  09/29/2023   Diabetic kidney evaluation - eGFR measurement  03/28/2024   Diabetic kidney evaluation - Urine ACR  03/28/2024   FOOT EXAM  04/01/2024   Medicare Annual Wellness (AWV)  05/17/2024   Pneumococcal Vaccine 11-29 Years old  Completed   Hepatitis C Screening  Completed   HIV Screening  Completed   HPV VACCINES  Aged Out   Meningococcal B Vaccine  Aged Out    Health Maintenance  Health Maintenance Due  Topic Date Due   DTaP/Tdap/Td (1 - Tdap) Never done   Colonoscopy  10/29/2022   Health Maintenance Items Addressed: Records requested for diabetic eye exam  Additional Screening:  Vision Screening: Recommended annual ophthalmology exams for early detection of glaucoma and other disorders of the eye.  Dental Screening: Recommended annual dental exams for proper oral hygiene  Community Resource Referral / Chronic Care Management: CRR required this visit?  No   CCM required this visit?  No     Plan:     I have personally reviewed and noted the following in the patient's chart:    Medical and social history Use of alcohol, tobacco or illicit drugs  Current medications and supplements including opioid prescriptions. Patient is not currently taking opioid prescriptions. Functional ability and status Nutritional status Physical activity Advanced directives List of other physicians Hospitalizations, surgeries, and ER visits in previous 12 months Vitals Screenings to include cognitive, depression, and falls Referrals and appointments  In addition, I have reviewed and discussed with patient certain preventive protocols, quality metrics, and best practice recommendations. A written personalized care plan for preventive services as well as general preventive health recommendations were provided to patient.     Seabron Cypress [place], [place]   1/61/0960   After Visit Summary: (MyChart) Due to this being a telephonic visit, the after visit summary with patients personalized plan was offered to patient via MyChart   Notes: Nothing significant to report at this time.

## 2023-05-23 ENCOUNTER — Telehealth (INDEPENDENT_AMBULATORY_CARE_PROVIDER_SITE_OTHER): Payer: Self-pay | Admitting: Family Medicine

## 2023-05-23 ENCOUNTER — Encounter: Payer: Self-pay | Admitting: Family Medicine

## 2023-05-23 DIAGNOSIS — E785 Hyperlipidemia, unspecified: Secondary | ICD-10-CM

## 2023-05-23 DIAGNOSIS — R252 Cramp and spasm: Secondary | ICD-10-CM | POA: Diagnosis not present

## 2023-05-23 DIAGNOSIS — E1169 Type 2 diabetes mellitus with other specified complication: Secondary | ICD-10-CM

## 2023-05-23 DIAGNOSIS — G609 Hereditary and idiopathic neuropathy, unspecified: Secondary | ICD-10-CM

## 2023-05-23 DIAGNOSIS — I10 Essential (primary) hypertension: Secondary | ICD-10-CM | POA: Diagnosis not present

## 2023-05-23 NOTE — Progress Notes (Signed)
   Subjective:    Patient ID: Carrie Cook, female    DOB: 1960-04-06, 63 y.o.   MRN: [id number]  HPI Virtual Visit via Video Note  I connected with Carrie Cook on 05/23/23 at 10:20 AM EDT by a video enabled telemedicine application and verified that I am speaking with the correct person using two identifiers. Patient with a lot of muscle cramps and discomfort that hit her all over but more so on the right side in her legs worse during the day several times per day denies any injury or other triggers.  Tries to do a decent job with eating and drinking appropriately Location: Patient: Home Provider: Office   I discussed the limitations of evaluation and management by telemedicine and the availability of in person appointments. The patient expressed understanding and agreed to proceed.  History of Present Illness:    Observations/Objective:   Assessment and Plan:   Follow Up Instructions:    I discussed the assessment and treatment plan with the patient. The patient was provided an opportunity to ask questions and all were answered. The patient agreed with the plan and demonstrated an understanding of the instructions.   The patient was advised to call back or seek an in-person evaluation if the symptoms worsen or if the condition fails to improve as anticipated.  I provided 15 minutes of non-face-to-face time during this encounter.   Charlotta Cook, MD    Review of Systems     Objective:   Physical Exam Patient had virtual visit-video Appears to be in no distress Atraumatic Neuro able to relate and oriented No apparent resp distress Color normal        Assessment & Plan:  1. Muscle cramps Lab work ordered [employer] recommended  - Basic metabolic panel with GFR - TSH + free T4 - Magnesium  2. Hyperlipidemia associated with type 2 diabetes mellitus (HCC) (Primary) Continue current approach healthy diet activity  3. Essential hypertension Healthy  diet activity medication check lab - Basic metabolic panel with GFR  4. Idiopathic peripheral neuropathy Check lab work await results - CBC - Vitamin B12  Regular follow-up visit later this year

## 2023-05-26 LAB — BASIC METABOLIC PANEL WITH GFR
BUN/Creatinine Ratio: 11 — ABNORMAL LOW (ref 12–28)
BUN: 9 mg/dL (ref 8–27)
CO2: 21 mmol/L (ref 20–29)
Calcium: 9.3 mg/dL (ref 8.7–10.3)
Chloride: 103 mmol/L (ref 96–106)
Creatinine, Ser: 0.82 mg/dL (ref 0.57–1.00)
Glucose: 169 mg/dL — ABNORMAL HIGH (ref 70–99)
Potassium: 4.3 mmol/L (ref 3.5–5.2)
Sodium: 138 mmol/L (ref 134–144)
eGFR: 81 mL/min/{1.73_m2} (ref >59)

## 2023-05-26 LAB — CBC
Hematocrit: 40.6 % (ref 34.0–46.6)
Hemoglobin: 13.5 g/dL (ref 11.1–15.9)
MCH: 28.6 pg (ref 26.6–33.0)
MCHC: 33.3 g/dL (ref 31.5–35.7)
MCV: 86 fL (ref 79–97)
Platelets: 230 10*3/uL (ref 150–450)
RBC: 4.72 x10E6/uL (ref 3.77–5.28)
RDW: 12.2 % (ref 11.7–15.4)
WBC: 7.4 10*3/uL (ref 3.4–10.8)

## 2023-05-26 LAB — MAGNESIUM: Magnesium: 2 mg/dL (ref 1.6–2.3)

## 2023-05-26 LAB — TSH+FREE T4
Free T4: 0.89 ng/dL (ref 0.82–1.77)
TSH: 0.785 u[IU]/mL (ref 0.450–4.500)

## 2023-05-26 LAB — VITAMIN B12: Vitamin B-12: 290 pg/mL (ref 232–1245)

## 2023-05-27 ENCOUNTER — Encounter: Payer: Self-pay | Admitting: Family Medicine

## 2023-06-04 ENCOUNTER — Other Ambulatory Visit (HOSPITAL_COMMUNITY): Payer: Self-pay

## 2023-06-04 NOTE — Telephone Encounter (Signed)
 Per rep, application was cancelled due to company not receiving provider pages. Not sure why they didn't receive. Will resubmit application online.   New encounter created.

## 2023-06-04 NOTE — Telephone Encounter (Signed)
 Correction, no new encounter created. Per test claim, patient has been filling at the pharmacy.

## 2023-06-08 ENCOUNTER — Other Ambulatory Visit: Payer: Self-pay

## 2023-06-08 ENCOUNTER — Telehealth: Payer: Self-pay | Admitting: Family Medicine

## 2023-06-08 MED ORDER — ROSUVASTATIN CALCIUM 20 MG PO TABS: ORAL_TABLET | ORAL | 1 refills | Status: DC

## 2023-06-08 NOTE — Telephone Encounter (Signed)
 Refill on  rosuvastatin  (CRESTOR ) 20 MG tablet  [facility]

## 2023-07-25 ENCOUNTER — Encounter: Payer: Self-pay | Admitting: Family Medicine

## 2023-07-25 ENCOUNTER — Ambulatory Visit: Admitting: Family Medicine

## 2023-07-25 DIAGNOSIS — M5116 Intervertebral disc disorders with radiculopathy, lumbar region: Secondary | ICD-10-CM

## 2023-07-25 MED ORDER — GABAPENTIN 400 MG PO CAPS: ORAL_CAPSULE | ORAL | 5 refills | Status: DC

## 2023-07-25 NOTE — Progress Notes (Signed)
   Subjective:    Patient ID: Carrie Cook, female    DOB: 1960-05-15, 63 y.o.   MRN: [id number]  HPI Right thigh down right leg pain going on for a couple of months  Has almost fallen in the past 5 months was able to catch herself before hitting the floor  Hx of back surgery Discussed the use of AI scribe software for clinical note transcription with the patient, who gave verbal consent to proceed.  History of Present Illness   Carrie Cook is a 63 year old female with a history of back surgeries who presents with worsening leg pain and numbness.  She has been experiencing burning pain in her back and leg for the past couple of months. The pain starts in her back, runs down the back of her leg, and extends to her foot, affecting her big toe and second toe. She describes the sensation as 'funky' and notes that the pain can occur even when sitting, such as during a family reunion. The pain is exacerbated by physical activity, including walking and standing, and sometimes occurs while sitting for extended periods.  She has a history of three back surgeries, the last of which was in November 2022. She is currently taking gabapentin , which helps with the burning sensation in her foot, although she sometimes forgets to take it in the middle of the day. She experiences numbness, burning, and tingling in her foot, which she describes as worsening over time. Gabapentin  helps with the burning if taken consistently.  Her diabetes management is ongoing, although she is unsure of her current numbers. She has a follow-up visit scheduled for September.  No new weakness or significant functional impairment in the legs. Reports numbness, burning, and tingling in the foot.       Review of Systems     Objective:   Physical Exam  General-in no acute distress Eyes-no discharge Lungs-respiratory rate normal, CTA CV-no murmurs,RRR Extremities skin warm dry no edema Neuro grossly normal Behavior  normal, alert Positive straight leg raise on the right negative on the left      Assessment & Plan:  Sciatica severe Adjust gabapentin  Should gradually help her situation If not seeing significant improvement over the next 4 to 6 weeks referral back to [facility] for further evaluation and probable MRI She will do her diabetes lab work and follow-up later as planned  Previous back surgery with Dr. Saul

## 2023-07-30 ENCOUNTER — Ambulatory Visit

## 2023-07-30 DIAGNOSIS — E119 Type 2 diabetes mellitus without complications: Secondary | ICD-10-CM

## 2023-07-30 LAB — HM DIABETES EYE EXAM

## 2023-07-30 NOTE — Progress Notes (Unsigned)
 Carrie Cook arrived 07/30/2023 and has given verbal consent to obtain images and complete their overdue diabetic retinal screening.  The images have been sent to an ophthalmologist or optometrist for review and interpretation.  Results will be sent back to Alphonsa Glendia LABOR, MD for review.  Patient has been informed they will be contacted when we receive the results via telephone or MyChart

## 2023-08-28 ENCOUNTER — Encounter: Payer: Self-pay | Admitting: *Deleted

## 2023-08-31 ENCOUNTER — Encounter: Payer: Self-pay | Admitting: Family Medicine

## 2023-09-17 ENCOUNTER — Telehealth: Payer: Self-pay

## 2023-09-17 NOTE — Telephone Encounter (Signed)
 [facility]

## 2023-09-17 NOTE — Telephone Encounter (Signed)
 Prescription Request  09/17/2023  LOV: Visit date not found  What is the name of the medication or equipment? gabapentin  (NEURONTIN ) 400 MG capsule   Have you contacted your pharmacy to request a refill? Yes   Which pharmacy would you like this sent to?   gabapentin  (NEURONTIN ) 400 MG capsule     Patient notified that their request is being sent to the clinical staff for review and that they should receive a response within 2 business days.   Please advise at Mobile [phone] (mobile)

## 2023-09-18 ENCOUNTER — Other Ambulatory Visit: Payer: Self-pay

## 2023-09-18 ENCOUNTER — Other Ambulatory Visit: Payer: Self-pay | Admitting: Family Medicine

## 2023-09-18 DIAGNOSIS — M5116 Intervertebral disc disorders with radiculopathy, lumbar region: Secondary | ICD-10-CM

## 2023-09-18 MED ORDER — GABAPENTIN 400 MG PO CAPS: ORAL_CAPSULE | ORAL | 5 refills | Status: AC

## 2023-09-18 NOTE — Telephone Encounter (Signed)
 6 months worth was sent last month I resent in the [facility] [address]

## 2023-10-01 ENCOUNTER — Telehealth: Payer: Self-pay

## 2023-10-01 NOTE — Telephone Encounter (Signed)
 Please reschedule near end of September or in October or same-day slot

## 2023-10-01 NOTE — Telephone Encounter (Signed)
---   pt stated  she tested positive for covid this morning and is still getting over it, symptomatic since Thursday. she will need to reschedule her 6 month follow up to something sooner than December please . ---   Copied from CRM [id number]. Topic: Appointments - Appointment Cancel/Reschedule >> Oct 01, 2023  9:35 AM Leonette SQUIBB wrote: Pt needs to reschedule Her attp on Wednesday the 10th because she is still sick and this is a well visit.  Dr. Alphonsa does not have anything again until Dec.  Can someone see if there is something sooner.  [phone]

## 2023-10-03 ENCOUNTER — Ambulatory Visit: Admitting: Family Medicine

## 2023-10-05 ENCOUNTER — Encounter (INDEPENDENT_AMBULATORY_CARE_PROVIDER_SITE_OTHER): Payer: Self-pay | Admitting: *Deleted

## 2023-10-08 ENCOUNTER — Other Ambulatory Visit: Payer: Self-pay | Admitting: Family Medicine

## 2023-10-12 ENCOUNTER — Telehealth: Payer: Self-pay | Admitting: Family Medicine

## 2023-10-12 ENCOUNTER — Other Ambulatory Visit: Payer: Self-pay | Admitting: Nurse Practitioner

## 2023-10-12 MED ORDER — AMLODIPINE BESYLATE 2.5 MG PO TABS: ORAL_TABLET | ORAL | 1 refills | Status: AC

## 2023-10-12 NOTE — Telephone Encounter (Signed)
 Refill on  amLODipine  (NORVASC ) 2.5 MG tablet   [facility] [address]

## 2023-10-16 ENCOUNTER — Ambulatory Visit (INDEPENDENT_AMBULATORY_CARE_PROVIDER_SITE_OTHER): Admitting: Podiatry

## 2023-10-16 DIAGNOSIS — D2372 Other benign neoplasm of skin of left lower limb, including hip: Secondary | ICD-10-CM

## 2023-10-16 DIAGNOSIS — D2371 Other benign neoplasm of skin of right lower limb, including hip: Secondary | ICD-10-CM

## 2023-10-16 NOTE — Progress Notes (Signed)
 She presents today chief complaint of painful benign skin lesions plantar aspect bilateral foot.  Objective: Pulses are palpable.  No open lesions or wounds.  Multiple benign skin lesions bilateral foot.  No open lesions are seen.  Assessment: Benign skin lesions bilateral diabetes.  Debridement of benign skin lesions.

## 2023-10-20 LAB — BASIC METABOLIC PANEL WITH GFR
BUN/Creatinine Ratio: 5 — ABNORMAL LOW (ref 12–28)
BUN: 5 mg/dL — ABNORMAL LOW (ref 8–27)
CO2: 22 mmol/L (ref 20–29)
Calcium: 9.2 mg/dL (ref 8.7–10.3)
Chloride: 101 mmol/L (ref 96–106)
Creatinine, Ser: 0.92 mg/dL (ref 0.57–1.00)
Glucose: 148 mg/dL — ABNORMAL HIGH (ref 70–99)
Potassium: 5.2 mmol/L (ref 3.5–5.2)
Sodium: 138 mmol/L (ref 134–144)
eGFR: 71 mL/min/1.73 (ref >59)

## 2023-10-20 LAB — LIPID PANEL
Chol/HDL Ratio: 3 ratio (ref 0.0–4.4)
Cholesterol, Total: 140 mg/dL (ref 100–199)
HDL: 47 mg/dL (ref >39)
LDL Chol Calc (NIH): 68 mg/dL (ref 0–99)
Triglycerides: 144 mg/dL (ref 0–149)
VLDL Cholesterol Cal: 25 mg/dL (ref 5–40)

## 2023-10-20 LAB — HEPATIC FUNCTION PANEL
ALT: 50 IU/L — ABNORMAL HIGH (ref 0–32)
AST: 42 IU/L — ABNORMAL HIGH (ref 0–40)
Albumin: 4.1 g/dL (ref 3.9–4.9)
Alkaline Phosphatase: 119 IU/L (ref 49–135)
Bilirubin Total: 0.4 mg/dL (ref 0.0–1.2)
Bilirubin, Direct: 0.11 mg/dL (ref 0.00–0.40)
Total Protein: 6.2 g/dL (ref 6.0–8.5)

## 2023-10-20 LAB — HEMOGLOBIN A1C
Est. average glucose Bld gHb Est-mCnc: 157 mg/dL
Hgb A1c MFr Bld: 7.1 % — ABNORMAL HIGH (ref 4.8–5.6)

## 2023-10-22 ENCOUNTER — Ambulatory Visit: Payer: Self-pay | Admitting: Family Medicine

## 2023-10-22 IMAGING — RF DG LUMBAR SPINE 1V
1 series · 1 of 1 positions shown · non-contrast
Comparison: Lumbar spine MRI-12/10/2020

CLINICAL DATA: L4-L5 microdiscectomy.

EXAM:
LUMBAR SPINE - 1 VIEW; DG C-ARM 1-60 MIN-NO REPORT
FLUOROSCOPY TIME:  5 seconds (4.3 mGy)

[Series 1: run · 1 of 1 slices shown]
[im 1/1]
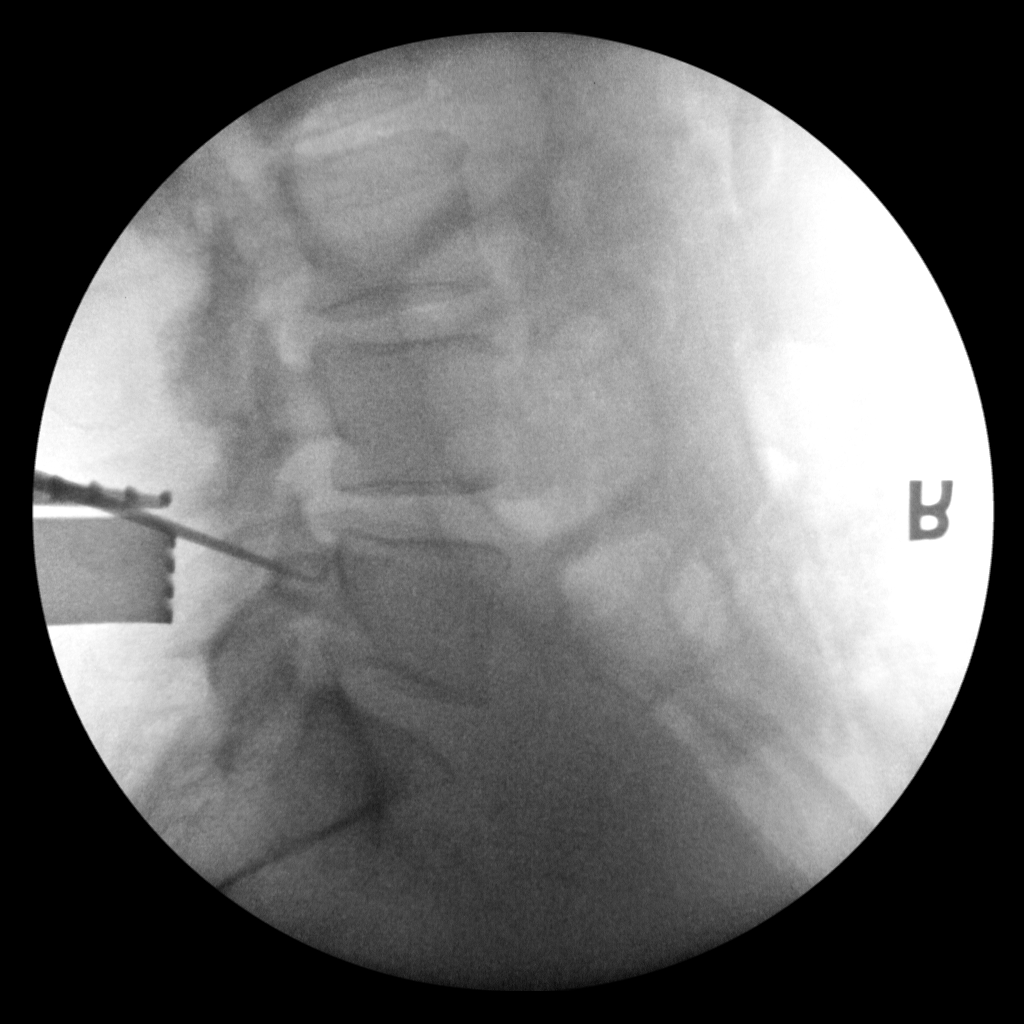

[1 of 1 positions shown; findings below may reference images not displayed]

FINDINGS: A single spot lateral projection fluoroscopic image of the lower
lumbar spine is provided for review. Spinal labeling is in keeping
with preprocedural lumbar spine MRI

Provided image demonstrates a radiopaque marking instrument
posterior to the L5 vertebral body. Additional radiopaque surgical
support apparatus is seen posterior to the operative site.
IMPRESSION: Intraoperative localization of L5 as above.

## 2023-10-25 ENCOUNTER — Ambulatory Visit (INDEPENDENT_AMBULATORY_CARE_PROVIDER_SITE_OTHER): Admitting: Family Medicine

## 2023-10-25 VITALS — BP 128/84 | HR 81 | Wt 178.1 lb

## 2023-10-25 DIAGNOSIS — E119 Type 2 diabetes mellitus without complications: Secondary | ICD-10-CM | POA: Diagnosis not present

## 2023-10-25 DIAGNOSIS — E1169 Type 2 diabetes mellitus with other specified complication: Secondary | ICD-10-CM | POA: Diagnosis not present

## 2023-10-25 DIAGNOSIS — Z1211 Encounter for screening for malignant neoplasm of colon: Secondary | ICD-10-CM

## 2023-10-25 DIAGNOSIS — I10 Essential (primary) hypertension: Secondary | ICD-10-CM

## 2023-10-25 DIAGNOSIS — K760 Fatty (change of) liver, not elsewhere classified: Secondary | ICD-10-CM

## 2023-10-25 DIAGNOSIS — Z23 Encounter for immunization: Secondary | ICD-10-CM

## 2023-10-25 DIAGNOSIS — E785 Hyperlipidemia, unspecified: Secondary | ICD-10-CM

## 2023-10-25 DIAGNOSIS — Z794 Long term (current) use of insulin: Secondary | ICD-10-CM

## 2023-10-25 NOTE — Progress Notes (Signed)
   Subjective:    Patient ID: Carrie Cook, female    DOB: 08-22-1960, 63 y.o.   MRN: [id number]  HPI Patient with type 2 diabetes Did do her lab work We discussed it in detail Results for orders placed or performed in visit on 08/31/23  HM DIABETES EYE EXAM   Collection Time: 07/30/23 12:21 PM  Result Value Ref Range   HM Diabetic Eye Exam No Retinopathy No Retinopathy   She takes her medicine regular basis denies any low sugar spells Does relate indiscretions with eating at times too much carbohydrates  Patient for blood pressure check up.  The patient does have hypertension.   Patient relates dietary measures try to minimize salt The importance of healthy diet and activity were discussed Patient relates compliance  The patient was seen today as part of a comprehensive diabetic check up. Patient has diabetes Patient relates good compliance with taking the medication. We discussed their diet and exercise activities  We also discussed the importance of notifying us  if any excessively high glucoses or low sugars.    Patient here for follow-up regarding cholesterol.    Patient relates taking medication on a regular basis Denies problems with medication Importance of dietary measures discussed Regular lab work regarding lipid and liver was checked and if needing additional labs was appropriately ordered   Review of Systems     Objective:   Physical Exam General-in no acute distress Eyes-no discharge Lungs-respiratory rate normal, CTA CV-no murmurs,RRR Extremities skin warm dry no edema Neuro grossly normal Behavior normal, alert        Assessment & Plan:   1. Colon cancer screening (Primary) After discussion and options she prefers Cologuard - Cologuard  2. Hyperlipidemia associated with type 2 diabetes mellitus (HCC) She will continue her medications check labs again in 6 months  3. Type 2 diabetes mellitus without complication, with long-term current  use of insulin  (HCC) Check A1c in 6 months healthy diet continue medication  4. Essential hypertension Blood pressure good control continue current medications  5. Fatty liver Liver enzymes slightly elevated goes along with fatty liver at this persists as a trend will need further workup Ozempic  should help her along with healthy eating  6. Need for influenza vaccination Today - Flu vaccine trivalent PF, 6mos and older(Flulaval,Afluria,Fluarix,Fluzone )

## 2023-11-16 LAB — COLOGUARD: COLOGUARD: NEGATIVE

## 2023-11-18 ENCOUNTER — Ambulatory Visit: Payer: Self-pay | Admitting: Family Medicine

## 2023-12-11 ENCOUNTER — Other Ambulatory Visit: Payer: Self-pay | Admitting: Family Medicine

## 2024-01-04 ENCOUNTER — Other Ambulatory Visit: Payer: Self-pay | Admitting: Family Medicine

## 2024-01-04 ENCOUNTER — Telehealth: Payer: Self-pay

## 2024-01-04 MED ORDER — SEMAGLUTIDE (2 MG/DOSE) 8 MG/3ML ~~LOC~~ SOPN: 2.0000 mg | PEN_INJECTOR | SUBCUTANEOUS | 3 refills | Status: AC

## 2024-01-04 NOTE — Telephone Encounter (Signed)
 [facility]

## 2024-01-04 NOTE — Telephone Encounter (Signed)
 Nurses So when it comes to Ozempic  we need to find out when was the last time she took Ozempic ?  (Side note-if a person has been on Ozempic  for a period of time then they stop it for a period of time-then we actually have to start back at the lowest dose and gradually build up otherwise a person can get very sick on the stomach)

## 2024-01-04 NOTE — Telephone Encounter (Signed)
 Prescription Request  01/04/2024  LOV: 09/17/2023  What is the name of the medication or equipment? Patient is wanting to be put back on Ozempic  2 mg per dose   Have you contacted your pharmacy to request a refill? Yes   Which pharmacy would you like this sent to?   [facility]     Patient notified that their request is being sent to the clinical staff for review and that they should receive a response within 2 business days.   Please advise at Mobile [phone] ([phone]

## 2024-01-19 ENCOUNTER — Other Ambulatory Visit: Payer: Self-pay | Admitting: Obstetrics & Gynecology

## 2024-04-15 ENCOUNTER — Ambulatory Visit: Admitting: Podiatry

## 2024-04-24 ENCOUNTER — Ambulatory Visit: Admitting: Family Medicine

## 2024-04-28 ENCOUNTER — Ambulatory Visit: Admitting: Family Medicine
